# Patient Record
Sex: Female | Born: 1950 | Race: Black or African American | Hispanic: No | Smoking: Former smoker
Health system: Southern US, Community
[De-identification: ages and names within clinical notes are randomized; demographics above are authoritative.]

## PROBLEM LIST (undated history)

## (undated) DIAGNOSIS — E785 Hyperlipidemia, unspecified: Secondary | ICD-10-CM

## (undated) DIAGNOSIS — F419 Anxiety disorder, unspecified: Secondary | ICD-10-CM

## (undated) DIAGNOSIS — K8020 Calculus of gallbladder without cholecystitis without obstruction: Secondary | ICD-10-CM

## (undated) DIAGNOSIS — K635 Polyp of colon: Secondary | ICD-10-CM

## (undated) DIAGNOSIS — D509 Iron deficiency anemia, unspecified: Secondary | ICD-10-CM

## (undated) DIAGNOSIS — D571 Sickle-cell disease without crisis: Secondary | ICD-10-CM

## (undated) DIAGNOSIS — K219 Gastro-esophageal reflux disease without esophagitis: Secondary | ICD-10-CM

## (undated) DIAGNOSIS — G459 Transient cerebral ischemic attack, unspecified: Secondary | ICD-10-CM

## (undated) DIAGNOSIS — E039 Hypothyroidism, unspecified: Secondary | ICD-10-CM

## (undated) DIAGNOSIS — Z972 Presence of dental prosthetic device (complete) (partial): Secondary | ICD-10-CM

## (undated) DIAGNOSIS — E079 Disorder of thyroid, unspecified: Secondary | ICD-10-CM

## (undated) DIAGNOSIS — R1310 Dysphagia, unspecified: Secondary | ICD-10-CM

## (undated) DIAGNOSIS — R519 Headache, unspecified: Secondary | ICD-10-CM

## (undated) DIAGNOSIS — Z87891 Personal history of nicotine dependence: Secondary | ICD-10-CM

## (undated) DIAGNOSIS — Z8774 Personal history of (corrected) congenital malformations of heart and circulatory system: Secondary | ICD-10-CM

## (undated) DIAGNOSIS — R0600 Dyspnea, unspecified: Secondary | ICD-10-CM

## (undated) DIAGNOSIS — Z973 Presence of spectacles and contact lenses: Secondary | ICD-10-CM

## (undated) DIAGNOSIS — E119 Type 2 diabetes mellitus without complications: Secondary | ICD-10-CM

## (undated) DIAGNOSIS — J40 Bronchitis, not specified as acute or chronic: Secondary | ICD-10-CM

## (undated) DIAGNOSIS — Z87898 Personal history of other specified conditions: Secondary | ICD-10-CM

## (undated) DIAGNOSIS — Z8719 Personal history of other diseases of the digestive system: Secondary | ICD-10-CM

## (undated) DIAGNOSIS — J45909 Unspecified asthma, uncomplicated: Secondary | ICD-10-CM

## (undated) DIAGNOSIS — R51 Headache: Secondary | ICD-10-CM

## (undated) DIAGNOSIS — I10 Essential (primary) hypertension: Secondary | ICD-10-CM

## (undated) DIAGNOSIS — M47812 Spondylosis without myelopathy or radiculopathy, cervical region: Secondary | ICD-10-CM

## (undated) DIAGNOSIS — D649 Anemia, unspecified: Secondary | ICD-10-CM

## (undated) HISTORY — PX: ROTATOR CUFF REPAIR: SHX139

## (undated) HISTORY — DX: Dysphagia, unspecified: R13.10

## (undated) HISTORY — DX: Anemia, unspecified: D64.9

## (undated) HISTORY — DX: Gastro-esophageal reflux disease without esophagitis: K21.9

## (undated) HISTORY — DX: Essential (primary) hypertension: I10

## (undated) HISTORY — DX: Iron deficiency anemia, unspecified: D50.9

## (undated) HISTORY — PX: TONSILLECTOMY: SUR1361

## (undated) HISTORY — DX: Disorder of thyroid, unspecified: E07.9

## (undated) HISTORY — DX: Personal history of (corrected) congenital malformations of heart and circulatory system: Z87.74

## (undated) HISTORY — DX: Personal history of other specified conditions: Z87.898

## (undated) HISTORY — PX: CHOLECYSTECTOMY: SHX55

## (undated) HISTORY — DX: Type 2 diabetes mellitus without complications: E11.9

## (undated) HISTORY — DX: Hyperlipidemia, unspecified: E78.5

## (undated) HISTORY — PX: NECK SURGERY: SHX720

## (undated) HISTORY — DX: Polyp of colon: K63.5

## (undated) HISTORY — DX: Personal history of nicotine dependence: Z87.891

## (undated) HISTORY — DX: Calculus of gallbladder without cholecystitis without obstruction: K80.20

## (undated) HISTORY — DX: Anxiety disorder, unspecified: F41.9

## (undated) HISTORY — DX: Hypothyroidism, unspecified: E03.9

## (undated) HISTORY — DX: Spondylosis without myelopathy or radiculopathy, cervical region: M47.812

## (undated) HISTORY — PX: ABDOMINAL HYSTERECTOMY: SHX81

## (undated) HISTORY — DX: Unspecified asthma, uncomplicated: J45.909

---

## 1990-07-05 DIAGNOSIS — J4489 Other specified chronic obstructive pulmonary disease: Secondary | ICD-10-CM | POA: Insufficient documentation

## 1990-07-05 DIAGNOSIS — J449 Chronic obstructive pulmonary disease, unspecified: Secondary | ICD-10-CM | POA: Insufficient documentation

## 1998-01-09 ENCOUNTER — Inpatient Hospital Stay (HOSPITAL_COMMUNITY): Admission: AD | Admit: 1998-01-09 | Discharge: 1998-01-15 | Payer: Self-pay | Admitting: Internal Medicine

## 1998-02-09 ENCOUNTER — Ambulatory Visit (HOSPITAL_COMMUNITY): Admission: RE | Admit: 1998-02-09 | Discharge: 1998-02-09 | Payer: Self-pay | Admitting: Internal Medicine

## 1999-08-13 ENCOUNTER — Encounter: Payer: Self-pay | Admitting: Internal Medicine

## 1999-08-13 ENCOUNTER — Ambulatory Visit (HOSPITAL_COMMUNITY): Admission: RE | Admit: 1999-08-13 | Discharge: 1999-08-13 | Payer: Self-pay | Admitting: *Deleted

## 2000-10-06 ENCOUNTER — Encounter: Payer: Self-pay | Admitting: Gastroenterology

## 2000-10-06 ENCOUNTER — Ambulatory Visit (HOSPITAL_COMMUNITY): Admission: RE | Admit: 2000-10-06 | Discharge: 2000-10-06 | Payer: Self-pay | Admitting: Gastroenterology

## 2001-03-09 ENCOUNTER — Ambulatory Visit (HOSPITAL_COMMUNITY): Admission: RE | Admit: 2001-03-09 | Discharge: 2001-03-09 | Payer: Self-pay | Admitting: Infectious Diseases

## 2001-03-09 ENCOUNTER — Encounter: Payer: Self-pay | Admitting: Infectious Diseases

## 2001-07-26 ENCOUNTER — Encounter: Payer: Self-pay | Admitting: Internal Medicine

## 2001-07-26 ENCOUNTER — Ambulatory Visit (HOSPITAL_COMMUNITY): Admission: RE | Admit: 2001-07-26 | Discharge: 2001-07-26 | Payer: Self-pay | Admitting: Internal Medicine

## 2002-12-12 ENCOUNTER — Encounter: Admission: RE | Admit: 2002-12-12 | Discharge: 2002-12-12 | Payer: Self-pay | Admitting: Internal Medicine

## 2002-12-12 ENCOUNTER — Encounter: Payer: Self-pay | Admitting: Internal Medicine

## 2003-03-21 ENCOUNTER — Encounter: Payer: Self-pay | Admitting: Emergency Medicine

## 2003-03-21 ENCOUNTER — Emergency Department (HOSPITAL_COMMUNITY): Admission: EM | Admit: 2003-03-21 | Discharge: 2003-03-21 | Payer: Self-pay | Admitting: Emergency Medicine

## 2003-05-12 ENCOUNTER — Ambulatory Visit (HOSPITAL_COMMUNITY): Admission: RE | Admit: 2003-05-12 | Discharge: 2003-05-12 | Payer: Self-pay | Admitting: Gastroenterology

## 2003-05-22 ENCOUNTER — Encounter: Payer: Self-pay | Admitting: Gastroenterology

## 2003-05-22 ENCOUNTER — Ambulatory Visit (HOSPITAL_COMMUNITY): Admission: RE | Admit: 2003-05-22 | Discharge: 2003-05-22 | Payer: Self-pay | Admitting: Gastroenterology

## 2003-05-22 ENCOUNTER — Encounter (INDEPENDENT_AMBULATORY_CARE_PROVIDER_SITE_OTHER): Payer: Self-pay | Admitting: Specialist

## 2004-08-23 ENCOUNTER — Encounter: Admission: RE | Admit: 2004-08-23 | Discharge: 2004-08-23 | Payer: Self-pay | Admitting: Internal Medicine

## 2004-08-28 ENCOUNTER — Ambulatory Visit (HOSPITAL_COMMUNITY): Admission: RE | Admit: 2004-08-28 | Discharge: 2004-08-28 | Payer: Self-pay | Admitting: Internal Medicine

## 2005-06-18 ENCOUNTER — Encounter: Admission: RE | Admit: 2005-06-18 | Discharge: 2005-06-18 | Payer: Self-pay | Admitting: Internal Medicine

## 2006-02-09 ENCOUNTER — Ambulatory Visit (HOSPITAL_COMMUNITY): Admission: RE | Admit: 2006-02-09 | Discharge: 2006-02-09 | Payer: Self-pay | Admitting: Internal Medicine

## 2006-02-09 ENCOUNTER — Encounter: Payer: Self-pay | Admitting: Vascular Surgery

## 2007-08-17 ENCOUNTER — Other Ambulatory Visit: Admission: RE | Admit: 2007-08-17 | Discharge: 2007-08-17 | Payer: Self-pay | Admitting: Internal Medicine

## 2007-12-07 ENCOUNTER — Ambulatory Visit (HOSPITAL_COMMUNITY): Admission: RE | Admit: 2007-12-07 | Discharge: 2007-12-07 | Payer: Self-pay | Admitting: Internal Medicine

## 2008-03-02 ENCOUNTER — Encounter: Admission: RE | Admit: 2008-03-02 | Discharge: 2008-03-02 | Payer: Self-pay | Admitting: Neurosurgery

## 2008-03-28 ENCOUNTER — Ambulatory Visit: Payer: Self-pay | Admitting: Vascular Surgery

## 2008-09-12 ENCOUNTER — Ambulatory Visit: Payer: Self-pay | Admitting: Vascular Surgery

## 2009-03-12 ENCOUNTER — Encounter: Admission: RE | Admit: 2009-03-12 | Discharge: 2009-03-12 | Payer: Self-pay | Admitting: Internal Medicine

## 2009-03-26 ENCOUNTER — Encounter (INDEPENDENT_AMBULATORY_CARE_PROVIDER_SITE_OTHER): Payer: Self-pay | Admitting: *Deleted

## 2009-03-26 ENCOUNTER — Inpatient Hospital Stay (HOSPITAL_COMMUNITY): Admission: AD | Admit: 2009-03-26 | Discharge: 2009-03-28 | Payer: Self-pay | Admitting: Internal Medicine

## 2009-03-27 ENCOUNTER — Encounter (INDEPENDENT_AMBULATORY_CARE_PROVIDER_SITE_OTHER): Payer: Self-pay | Admitting: *Deleted

## 2009-03-27 ENCOUNTER — Ambulatory Visit: Payer: Self-pay | Admitting: Gastroenterology

## 2009-03-28 ENCOUNTER — Encounter: Payer: Self-pay | Admitting: Gastroenterology

## 2009-03-28 ENCOUNTER — Encounter (INDEPENDENT_AMBULATORY_CARE_PROVIDER_SITE_OTHER): Payer: Self-pay | Admitting: *Deleted

## 2009-03-29 ENCOUNTER — Encounter: Payer: Self-pay | Admitting: Gastroenterology

## 2009-04-06 ENCOUNTER — Ambulatory Visit: Payer: Self-pay | Admitting: Vascular Surgery

## 2009-04-06 ENCOUNTER — Encounter (INDEPENDENT_AMBULATORY_CARE_PROVIDER_SITE_OTHER): Payer: Self-pay | Admitting: *Deleted

## 2009-04-17 ENCOUNTER — Ambulatory Visit: Payer: Self-pay | Admitting: Gastroenterology

## 2009-04-17 DIAGNOSIS — E119 Type 2 diabetes mellitus without complications: Secondary | ICD-10-CM | POA: Insufficient documentation

## 2009-05-02 ENCOUNTER — Ambulatory Visit: Payer: Self-pay | Admitting: Gastroenterology

## 2009-06-28 ENCOUNTER — Inpatient Hospital Stay (HOSPITAL_COMMUNITY): Admission: AD | Admit: 2009-06-28 | Discharge: 2009-06-30 | Payer: Self-pay | Admitting: Internal Medicine

## 2009-06-28 ENCOUNTER — Encounter (INDEPENDENT_AMBULATORY_CARE_PROVIDER_SITE_OTHER): Payer: Self-pay | Admitting: *Deleted

## 2009-06-29 ENCOUNTER — Ambulatory Visit: Payer: Self-pay | Admitting: Gastroenterology

## 2009-06-29 ENCOUNTER — Encounter (INDEPENDENT_AMBULATORY_CARE_PROVIDER_SITE_OTHER): Payer: Self-pay | Admitting: *Deleted

## 2009-06-30 ENCOUNTER — Encounter (INDEPENDENT_AMBULATORY_CARE_PROVIDER_SITE_OTHER): Payer: Self-pay | Admitting: *Deleted

## 2009-07-04 ENCOUNTER — Ambulatory Visit: Payer: Self-pay | Admitting: Gastroenterology

## 2009-07-04 DIAGNOSIS — K5521 Angiodysplasia of colon with hemorrhage: Secondary | ICD-10-CM | POA: Insufficient documentation

## 2009-07-06 ENCOUNTER — Ambulatory Visit (HOSPITAL_COMMUNITY): Admission: RE | Admit: 2009-07-06 | Discharge: 2009-07-06 | Payer: Self-pay | Admitting: Gastroenterology

## 2009-07-06 ENCOUNTER — Encounter: Payer: Self-pay | Admitting: Gastroenterology

## 2009-07-09 ENCOUNTER — Encounter: Payer: Self-pay | Admitting: Gastroenterology

## 2009-07-26 ENCOUNTER — Ambulatory Visit: Payer: Self-pay | Admitting: Gastroenterology

## 2009-07-26 LAB — CONVERTED CEMR LAB
Basophils Relative: 0.9 % (ref 0.0–3.0)
Eosinophils Absolute: 0.2 10*3/uL (ref 0.0–0.7)
Eosinophils Relative: 3 % (ref 0.0–5.0)
HCT: 29.5 % — ABNORMAL LOW (ref 36.0–46.0)
Hemoglobin: 9.6 g/dL — ABNORMAL LOW (ref 12.0–15.0)
Lymphs Abs: 1.3 10*3/uL (ref 0.7–4.0)
MCHC: 32.7 g/dL (ref 30.0–36.0)
Monocytes Absolute: 0.5 10*3/uL (ref 0.1–1.0)
RBC: 3.34 M/uL — ABNORMAL LOW (ref 3.87–5.11)
WBC: 6.5 10*3/uL (ref 4.5–10.5)

## 2009-08-02 ENCOUNTER — Ambulatory Visit: Payer: Self-pay | Admitting: Gastroenterology

## 2009-08-02 LAB — CONVERTED CEMR LAB
Basophils Absolute: 0 10*3/uL (ref 0.0–0.1)
Eosinophils Absolute: 0.2 10*3/uL (ref 0.0–0.7)
Hemoglobin: 9.1 g/dL — ABNORMAL LOW (ref 12.0–15.0)
Lymphocytes Relative: 18.6 % (ref 12.0–46.0)
Lymphs Abs: 1.2 10*3/uL (ref 0.7–4.0)
MCHC: 32.7 g/dL (ref 30.0–36.0)
Monocytes Absolute: 0.5 10*3/uL (ref 0.1–1.0)
Neutro Abs: 4.4 10*3/uL (ref 1.4–7.7)
RDW: 17.6 % — ABNORMAL HIGH (ref 11.5–14.6)

## 2009-08-03 ENCOUNTER — Telehealth: Payer: Self-pay | Admitting: Gastroenterology

## 2009-08-06 ENCOUNTER — Telehealth: Payer: Self-pay | Admitting: Gastroenterology

## 2009-08-16 ENCOUNTER — Ambulatory Visit: Payer: Self-pay | Admitting: Gastroenterology

## 2009-08-20 ENCOUNTER — Telehealth: Payer: Self-pay | Admitting: Gastroenterology

## 2009-08-20 DIAGNOSIS — D649 Anemia, unspecified: Secondary | ICD-10-CM | POA: Insufficient documentation

## 2009-08-20 LAB — CONVERTED CEMR LAB
Basophils Absolute: 0 10*3/uL (ref 0.0–0.1)
Eosinophils Absolute: 0.2 10*3/uL (ref 0.0–0.7)
HCT: 24.3 % — ABNORMAL LOW (ref 36.0–46.0)
Lymphs Abs: 1.3 10*3/uL (ref 0.7–4.0)
Monocytes Absolute: 0.5 10*3/uL (ref 0.1–1.0)
Neutro Abs: 4.4 10*3/uL (ref 1.4–7.7)
RDW: 18.2 % — ABNORMAL HIGH (ref 11.5–14.6)

## 2009-08-27 ENCOUNTER — Encounter (HOSPITAL_COMMUNITY): Admission: RE | Admit: 2009-08-27 | Discharge: 2009-11-25 | Payer: Self-pay | Admitting: Gastroenterology

## 2009-09-11 ENCOUNTER — Ambulatory Visit: Payer: Self-pay | Admitting: Gastroenterology

## 2009-09-11 LAB — CONVERTED CEMR LAB
Eosinophils Absolute: 0.3 10*3/uL (ref 0.0–0.7)
HCT: 26.5 % — ABNORMAL LOW (ref 36.0–46.0)
Lymphs Abs: 1 10*3/uL (ref 0.7–4.0)
MCHC: 33.9 g/dL (ref 30.0–36.0)
MCV: 89.6 fL (ref 78.0–100.0)
Monocytes Absolute: 0.5 10*3/uL (ref 0.1–1.0)
Neutrophils Relative %: 61.6 % (ref 43.0–77.0)
Platelets: 233 10*3/uL (ref 150.0–400.0)

## 2009-09-13 ENCOUNTER — Encounter (INDEPENDENT_AMBULATORY_CARE_PROVIDER_SITE_OTHER): Payer: Self-pay | Admitting: *Deleted

## 2009-09-13 ENCOUNTER — Observation Stay (HOSPITAL_COMMUNITY): Admission: AD | Admit: 2009-09-13 | Discharge: 2009-09-17 | Payer: Self-pay | Admitting: Internal Medicine

## 2009-09-14 ENCOUNTER — Encounter (INDEPENDENT_AMBULATORY_CARE_PROVIDER_SITE_OTHER): Payer: Self-pay | Admitting: *Deleted

## 2009-09-15 ENCOUNTER — Encounter (INDEPENDENT_AMBULATORY_CARE_PROVIDER_SITE_OTHER): Payer: Self-pay | Admitting: *Deleted

## 2009-09-16 ENCOUNTER — Encounter (INDEPENDENT_AMBULATORY_CARE_PROVIDER_SITE_OTHER): Payer: Self-pay | Admitting: *Deleted

## 2009-09-24 ENCOUNTER — Telehealth: Payer: Self-pay | Admitting: Gastroenterology

## 2009-10-03 ENCOUNTER — Ambulatory Visit: Payer: Self-pay | Admitting: Gastroenterology

## 2009-10-03 LAB — CONVERTED CEMR LAB
Basophils Absolute: 0.1 10*3/uL (ref 0.0–0.1)
Eosinophils Absolute: 0.4 10*3/uL (ref 0.0–0.7)
Lymphocytes Relative: 20.2 % (ref 12.0–46.0)
MCHC: 34.4 g/dL (ref 30.0–36.0)
MCV: 90.8 fL (ref 78.0–100.0)
Monocytes Absolute: 0.5 10*3/uL (ref 0.1–1.0)
Neutrophils Relative %: 63.4 % (ref 43.0–77.0)
Platelets: 224 10*3/uL (ref 150.0–400.0)
RBC: 3.14 M/uL — ABNORMAL LOW (ref 3.87–5.11)
RDW: 15.8 % — ABNORMAL HIGH (ref 11.5–14.6)

## 2009-10-05 ENCOUNTER — Ambulatory Visit: Payer: Self-pay | Admitting: Gastroenterology

## 2009-10-05 DIAGNOSIS — R1310 Dysphagia, unspecified: Secondary | ICD-10-CM | POA: Insufficient documentation

## 2009-10-09 ENCOUNTER — Telehealth: Payer: Self-pay | Admitting: Gastroenterology

## 2009-10-09 ENCOUNTER — Ambulatory Visit (HOSPITAL_COMMUNITY): Admission: RE | Admit: 2009-10-09 | Discharge: 2009-10-09 | Payer: Self-pay | Admitting: Gastroenterology

## 2009-10-10 ENCOUNTER — Telehealth: Payer: Self-pay | Admitting: Gastroenterology

## 2009-10-10 ENCOUNTER — Encounter (INDEPENDENT_AMBULATORY_CARE_PROVIDER_SITE_OTHER): Payer: Self-pay | Admitting: *Deleted

## 2009-10-25 ENCOUNTER — Ambulatory Visit: Payer: Self-pay | Admitting: Vascular Surgery

## 2009-11-08 ENCOUNTER — Telehealth: Payer: Self-pay | Admitting: Gastroenterology

## 2009-11-09 ENCOUNTER — Ambulatory Visit: Payer: Self-pay | Admitting: Gastroenterology

## 2009-11-09 ENCOUNTER — Ambulatory Visit (HOSPITAL_COMMUNITY): Admission: RE | Admit: 2009-11-09 | Discharge: 2009-11-09 | Payer: Self-pay | Admitting: Gastroenterology

## 2010-01-03 ENCOUNTER — Ambulatory Visit: Payer: Self-pay | Admitting: Gastroenterology

## 2010-01-14 ENCOUNTER — Telehealth: Payer: Self-pay | Admitting: Gastroenterology

## 2010-05-31 ENCOUNTER — Ambulatory Visit: Payer: Self-pay | Admitting: Vascular Surgery

## 2010-07-04 NOTE — Progress Notes (Signed)
Summary: Pt. rescheduled procedure  Phone Note Call from Patient Call back at Home Phone [phone]   Caller: Patient Call For: Dr. Arlyce Dice Reason for Call: Talk to Nurse Summary of Call: would like to resch hospital procedure Initial call taken by: Vallarie Mare,  Oct 10, 2009 10:32 AM  Follow-up for Phone Call        Pt. rescheduled her procedure to 11-09-09 at 1pm. All instructions reviewed w/pt's daughter by phone and mailed to pt. Pt. instructed to call back as needed.  Follow-up by: Laureen Ochs LPN,  Oct 10, 2009 11:02 AM

## 2010-07-04 NOTE — Letter (Signed)
Summary: Diabetic Instructions  [facility]  [address] [place], [place] [address]   Phone: [phone]  Fax: [phone]    Lacey Vega 1951/05/18 MRN: [id number]   X    ORAL DIABETIC MEDICATION INSTRUCTIONS METFORMIN  The day before your procedure:   Take your diabetic pill as you do normally  The day of your procedure:   Do not take your diabetic pill    We will check your blood sugar levels during the admission process and again in Recovery before discharging you home  ________________________________________________________________________  _  _   INSULIN (LONG ACTING) MEDICATION INSTRUCTIONS (Lantus, NPH, 70/30, Humulin, Novolin-N)   The day before your procedure:   Take  your regular evening dose    The day of your procedure:   Do not take your morning dose    _  _   INSULIN (SHORT ACTING) MEDICATION INSTRUCTIONS (Regular, Humulog, Novolog)   The day before your procedure:   Do not take your evening dose   The day of your procedure:   Do not take your morning dose   _  _   INSULIN PUMP MEDICATION INSTRUCTIONS  We will contact the physician managing your diabetic care for written dosage instructions for the day before your procedure and the day of your procedure.  Once we have received the instructions, we will contact you.

## 2010-07-04 NOTE — Progress Notes (Signed)
Summary: Pt. Cancelled Manometry  Phone Note From Other Clinic Call back at [facility] Phone [phone]   Caller: Camelia Eng at [facility].  Call For: patient Summary of Call: Pt. called and cancelled her Manometry scheduled for 01-15-10,states she doesn't want to have it done. "I don't think it will help."   I encouraged pt. to have manometry done, she states she will callback, when she is ready to reschedule it. Pt. instructed to call back as needed.  Initial call taken by: Laureen Ochs LPN,  January 14, 2010 1:46 PM  Follow-up for Phone Call        ok Follow-up by: Louis Meckel MD,  January 15, 2010 10:03 AM

## 2010-07-04 NOTE — Progress Notes (Signed)
Summary: Blood Transfusion and Repeat Labs scheduled  Phone Note Outgoing Call   Call placed by: Laureen Ochs LPN,  August 20, 2009 12:32 PM Call placed to: Patient Summary of Call: Per Dr.Kaplan, pt. needs a transfusion of 2 units PRBC and repeat labs 2 weeks after transfusion.  Transfusion is scheduled for 08-28-09 at 10:15am at [facility], pt. will get labs for [facility] on 08-27-09. She will have a repeat CBC at [facility] on 09-11-09. Pt. instructed to call back as needed.  Initial call taken by: Laureen Ochs LPN,  August 20, 2009 12:44 PM  New Problems: ANEMIA (ICD-285.9)   New Problems: ANEMIA (ICD-285.9)

## 2010-07-04 NOTE — Procedures (Signed)
Summary: Manometry Order/[place]  Manometry Order/[place]   Imported By: Sherian Rein 01/08/2010 09:02:28  _____________________________________________________________________  External Attachment:    Type:   Image     Comment:   External Document

## 2010-07-04 NOTE — Procedures (Signed)
Summary: Upper Endoscopy w/DIL  Patient: Lacey Vega Note: All result statuses are Final unless otherwise noted.  Tests: (1) Upper Endoscopy w/DIL (UED)  UED Upper Endoscopy w/DIL                             DONE     [facility]     [address] [place], [place]  [address]           ENDOSCOPY PROCEDURE REPORT           PATIENT:  Lacey Vega, Lacey Vega  MR#:  [id number]     BIRTHDATE:  [birth year], 58 yrs. old  GENDER:  female           ENDOSCOPIST:  Barbette Hair. Arlyce Dice, MD     ASSISTANT:           PROCEDURE DATE:  11/09/2009     PROCEDURE:  EGD with balloon dilatation     ASA CLASS:  Class II     INDICATIONS:  1) dysphagia           MEDICATIONS:   Fentanyl 80 mcg, Versed 8 mg IV, glycopyrrolate     (Robinal) 0.2 mg     TOPICAL ANESTHETIC:  Cetacaine Spray           DESCRIPTION OF PROCEDURE:   After the risks benefits and     alternatives of the procedure were thoroughly explained, informed     consent was obtained.  The  endoscope was introduced through the     mouth and advanced to the third portion of the duodenum, without     limitations.  The instrument was slowly withdrawn as the mucosa     was carefully examined.           A stricture was found at the gastroesophageal junction. Early     stricture  The examination was otherwise normal.    Dilation was     then performed at the gastroesphageal junction           1) Dilator:  Balloon  Size(s):  18-19     Resistance:  minimal  Heme:  none     Appearance:           COMPLICATIONS:  None           ENDOSCOPIC IMPRESSION:     1) Stricture at the gastroesophageal junction - s/p balloon     dilitation     2) Otherwise normal examination.     RECOMMENDATIONS:     1) continue PPI     2) call office to schedule an office visit for _76month           REPEAT EXAM:  No           ______________________________     Barbette Hair. Arlyce Dice, MD           CC:  Willey Blade, M.D.           n.     eSIGNED:   Barbette Hair.  Kolbee Bogusz at 11/09/2009 01:34 PM           Lacey Vega, [id number]  Note: An exclamation mark (!) indicates a result that was not dispersed into the flowsheet. Document Creation Date: 11/09/2009 1:35 PM _______________________________________________________________________  (1) Order result status: Final Collection or observation date-time: 11/09/2009 13:31 Requested date-time:  Receipt date-time:  Reported date-time:  Referring Physician:   Ordering  Physician: Melvia Heaps [phone]) Specimen Source:  Source: Launa Grill Order Number: [id number] Lab site:

## 2010-07-04 NOTE — Procedures (Signed)
Summary: Prep/[facility]  Prep/[facility]   Imported By: Lester Holcomb 07/06/2009 09:42:46  _____________________________________________________________________  External Attachment:    Type:   Image     Comment:   External Document

## 2010-07-04 NOTE — Procedures (Signed)
Summary: Small Bowel Endoscopy  Patient: Lacey Vega Note: All result statuses are Final unless otherwise noted.  Tests: (1) Small Bowel Endoscopy (SBE)  SBE Small Bowel Endoscopy                             DONE     [facility]     [address] [place], [place]  [address]           OPERATIVE PROCEDURE REPORT           PATIENT:  Lacey Vega, Lacey Vega  MR#:  [id number]     BIRTHDATE:  1951-01-09, 58 yrs. old  GENDER:  female           ENDOSCOPIST:  Barbette Hair. Arlyce Dice, MD     ASSISTANT:           PROCEDURE DATE:  07/06/2009     PROCEDURE:  Small Bowel Endoscopy, Argon Plasma Coagulation     ASA CLASS:  Class II     INDICATIONS:  1) a-v malformation           MEDICATIONS:   Fentanyl 100 mcg IV, Versed 10 mg IV, Benadryl 12.5     mg IV, glycopyrrolate (Robinal) 0.2 mg IV     TOPICAL ANESTHETIC:  Cetacaine Spray           DESCRIPTION OF PROCEDURE:   After the risks benefits and     alternatives of the procedure were thoroughly explained, informed     consent was obtained.  The  endoscope was introduced through the     mouth and advanced to the proximal jejunum utilizing the pediatric     colonoscope, to 160cm, without limitations.  The instrument was     slowly withdrawn as the mucosa was fully examined.     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    The scope was then withdrawn from     the patient and the procedure terminated.           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 Arlyce Dice, MD          CC:  Willey Blade, M.D.           n.     eSIGNED:   Barbette Hair. Delphin Funes at 07/06/2009 03:54 PM           Lacey Vega, [id number]  Note: An exclamation mark (!) indicates a result that was not dispersed into the flowsheet. Document Creation Date: 07/06/2009 3:54 PM _______________________________________________________________________  (1) Order result status: Final Collection or observation date-time: 07/06/2009 15:47 Requested date-time:  Receipt date-time:  Reported date-time:  Referring Physician:   Ordering Physician: Melvia Heaps [phone]) Specimen Source:  Source: Launa Grill Order Number: [id number] Lab site:   Appended Document: Small Bowel Endoscopy Pt. for labs 07-30-09 and appt. with Dr.Jdyn Parkerson on 08-02-09 at 8:30am. Letter mailed to pt. Pt. instructed to call back as needed.

## 2010-07-04 NOTE — Assessment & Plan Note (Signed)
Summary: POST HOSP/ANEMIA    CHECK HGB/MAY NEED ENTEROSCOPY-PER AMY.  ...   History of Present Illness Visit Type: Follow-up Visit Primary GI MD: Melvia Heaps MD [facility] Primary Provider: Willey Blade, MD Chief Complaint: Patient here for f/u after hospitalization for anemia. Patient states that she is doing well and denies any GI symptoms at this time. She denies any SOB, increased fatigue, abdominal pain, change in bowels or upper GI symptoms. History of Present Illness:   [name] has returned for her iron deficiency anemia.  She was hospitalized several days ago with a symptomatic anemia.  Hemoglobin was 6.5.  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  She has had no overt bleeding.  Colonoscopy and upper endoscopy in October, 2000 and negative for a GI bleeding source.  The patient denies melena or hematochezia.   GI Review of Systems      Denies abdominal pain, acid reflux, belching, bloating, chest pain, dysphagia with liquids, dysphagia with solids, heartburn, loss of appetite, nausea, vomiting, vomiting blood, weight loss, and  weight gain.        Denies anal fissure, black tarry stools, change in bowel habit, constipation, diarrhea, diverticulosis, fecal incontinence, heme positive stool, hemorrhoids, irritable bowel syndrome, jaundice, light color stool, liver problems, rectal bleeding, and  rectal pain. Preventive Screening-Counseling & Management  Caffeine-Diet-Exercise     Does Patient Exercise: no    Current Medications (verified): 1)  Singulair 10 Mg Tabs (Montelukast Sodium) .Marland Kitchen.. 1 Tablet By Mouth Once Daily 2)  Centrum Silver  Tabs (Multiple Vitamins-Minerals) .Marland Kitchen.. 1 Tablet By Mouth Once Daily 3)  Oscal 500/200 D-3 500-200 Mg-Unit Tabs (Calcium-Vitamin D) .Marland Kitchen.. 1 Tablet By Mouth Once Daily 4)  Premarin 0.3 Mg Tabs (Estrogens Conjugated) .Marland Kitchen.. 1 Tablet By Mouth Once Daily 5)  Omeprazole 40 Mg Cpdr (Omeprazole) .... Take 1 Tablet By Mouth Once A Day 6)   Cinnamon 500 Mg Tabs (Cinnamon) .... Take 1 Tablet By Mouth Once A Day 7)  Fish Oil 1200 Mg Caps (Omega-3 Fatty Acids) .... Take 1 Capsule By Mouth Once A Day 8)  Metformin Hcl 500 Mg Tabs (Metformin Hcl) .... Take 1 Tablet By Mouth Once A Day 9)  Soma 350 Mg Tabs (Carisoprodol) .... Take 1 Tablet 3 Times A Day For Muscle Spasm 10)  Neurontin 100 Mg Caps (Gabapentin) .... Take 1 Tablet By Mouth Two Times A Day 11)  Magnesium Oxide 400 Mg Tabs (Magnesium Oxide) .... Take 1 Tablet By Mouth Two Times A Day 12)  Levothroid 150 Mcg Tabs (Levothyroxine Sodium) .... Take 1 Tablet By Mouth Once A Day 13)  Nu-Iron 150 Mg Caps (Polysaccharide Iron Complex) .... Take 1 Tablet By Mouth Two Times A Day 14)  Vitamin B-12 1000 Mcg Tabs (Cyanocobalamin) .... Take 1 Tablet By Mouth Once A Day  Allergies (verified): No Known Drug Allergies  Past History:  Past Medical History: Reviewed history from 04/17/2009 and no changes required. Esophageal Stricture Hyperplastic Colon Polyps GERD Anemia Anxiety Disorder Arthritis Asthma Diabetes Gallstones Hyperlipidemia Hypothyroidism  Past Surgical History: Reviewed history from 04/11/2009 and no changes required. Cholecystectomy Hysterectomy Neck Surgery  Family History: Reviewed history from 04/17/2009 and no changes required. No FH of Colon Cancer: Family History of Diabetes: Mother Family History of Heart Disease: Brother deceased MI, ? 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 Quit in 2002 Daily Caffeine Use-1-2 cups daily Illicit Drug Use - no Alcohol Use -  no Patient does not get regular exercise.  Does Patient Exercise:  no  Review of Systems       The patient complains of allergy/sinus, anemia, and muscle pains/cramps.    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  Recommendations #1 enteroscopy with laser obliteration of any AVMs #2 continue iron supplementation indefinitely  Orders: Enteroscopy  (Enteroscopy)  Problem # 2:  DM (ICD-250.00) Assessment: Comment Only  Patient Instructions: 1)  CC Dr. Willey Blade 2)  Your Small Bowel Enteroscopy/ERBY is scheduled for 07/06/2009 at 2:15 at [facility] End[facility] department 3)  You have been instructed on your Diabetic meds 4)  The medication list was reviewed and reconciled.  All changed / newly prescribed medications were explained.  A complete medication list was provided to the patient / caregiver.

## 2010-07-04 NOTE — Assessment & Plan Note (Signed)
Summary: POST HOSPITAL AND LABS FROM 10-03-09  FOLLOW-UP.         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  Her last hemoglobin was improved at 9.8.    She complains of dysphagia to liquids and solids.  On occasion she has great difficulty due to food or sometimes liquid getting stuck immediately after swallowing.  She eventually pass it or throat up. When this occurs she may have chest pain that radiates to the back. Prior endoscopy did not demonstrate an  esophageal stricture.   GI Review of Systems    Reports dysphagia with liquids and  dysphagia with solids.      Denies abdominal pain, acid reflux, belching, bloating, chest pain, heartburn, loss of appetite, nausea, vomiting, vomiting blood, weight loss, and  weight gain.        Denies anal fissure, black tarry stools, change in bowel habit, constipation, diarrhea, diverticulosis, fecal incontinence, heme positive stool, hemorrhoids, irritable bowel syndrome, jaundice, light color stool, liver problems, rectal bleeding, and  rectal pain.    Current Medications (verified): 1)  Singulair 10 Mg Tabs (Montelukast Sodium) .Marland Kitchen.. 1 Tablet By Mouth Once Daily 2)  Centrum Silver  Tabs (Multiple Vitamins-Minerals) .Marland Kitchen.. 1 Tablet By Mouth Once Daily 3)  Oscal 500/200 D-3 500-200 Mg-Unit Tabs (Calcium-Vitamin D) .Marland Kitchen.. 1 Tablet By Mouth Once Daily 4)  Premarin 0.3 Mg Tabs (Estrogens Conjugated) .Marland Kitchen.. 1 Tablet By Mouth Once Daily 5)  Dexilant 60 Mg Cpdr (Dexlansoprazole) .... One Tablet By Mouth Once Daily 6)  Cinnamon 500 Mg Tabs (Cinnamon) .... Take 1 Tablet By Mouth Once A Day 7)  Fish  Oil 1200 Mg Caps (Omega-3 Fatty Acids) .... Take 1 Capsule By Mouth Once A Day 8)  Metformin Hcl 500 Mg Tabs (Metformin Hcl) .... Take 1 Tablet By Mouth Once A Day 9)  Soma 350 Mg Tabs (Carisoprodol) .... Take 1 Tablet 3 Times A Day For Muscle Spasm 10)  Neurontin 100 Mg Caps (Gabapentin) .... Take 1 Tablet By Mouth Two Times A Day 11)  Magnesium Oxide 400 Mg Tabs (Magnesium Oxide) .... Take 1 Tablet By Mouth Two Times A Day 12)  Levothroid 150 Mcg Tabs (Levothyroxine Sodium) .... Take 1 Tablet By Mouth Once A Day 13)  Vitamin B-12 1000 Mcg Tabs (Cyanocobalamin) .... Take 1 Tablet By Mouth Once A Day 14)  Feosol 200 (65 Fe) Mg Tabs (Ferrous Sulfate Dried) .... Take 1 P.o. B.i.d. 15)  Hydrocodone-Acetaminophen 5-325 Mg Tabs (Hydrocodone-Acetaminophen) .... As Needed For Headache 16)  Metoprolol Tartrate 25 Mg Tabs (Metoprolol Tartrate) .... Take 1/2 Tablet By Mouth Two Times A Day  Allergies (verified): No Known Drug Allergies  Past History:  Past Medical History: Reviewed history from 04/17/2009 and no changes required. Esophageal Stricture Hyperplastic Colon Polyps GERD Anemia Anxiety Disorder Arthritis Asthma Diabetes Gallstones Hyperlipidemia Hypothyroidism  Past Surgical History: Reviewed history from 04/11/2009 and no changes required. Cholecystectomy Hysterectomy Neck Surgery  Family History: Reviewed history from 04/17/2009 and no changes required. No FH of Colon Cancer: Family History of Diabetes: Mother Family History of Heart Disease: Brother deceased MI, ? Father MI  Social History:  Reviewed history from 07/04/2009 and no changes required. 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 Quit in 2002 Daily Caffeine Use-1-2 cups daily Illicit Drug Use - no Alcohol Use - no Patient does not get regular exercise.   Review of Systems       The patient complains of fatigue.  The patient denies allergy/sinus, anemia,  anxiety-new, arthritis/joint pain, back pain, blood in urine, breast changes/lumps, confusion, cough, coughing up blood, depression-new, fainting, fever, headaches-new, hearing problems, heart murmur, heart rhythm changes, itching, menstrual pain, muscle pains/cramps, night sweats, nosebleeds, pregnancy symptoms, shortness of breath, skin rash, sleeping problems, sore throat, swelling of feet/legs, swollen lymph glands, thirst - excessive, urination - excessive, urination changes/pain, urine leakage, vision changes, and voice change.    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  On supplementary iron her blood counts are stable.  Plan to continue with the same while monitoring her blood counts  Problem # 2:  DYSPHAGIA UNSPECIFIED (ICD-787.20) Symptoms suggested a motility disorder.  A fixed stricture is less likely.  Medications #1 barium esophagogram  Patient Instructions: 1)  Copy sent to : [name],MD  Appended Document: Orders Update    Clinical Lists Changes  Orders: Added new Test order of Barium Swallow (Barium Swallow) - Signed

## 2010-07-04 NOTE — Letter (Signed)
Summary: Appt Reminder 2  [facility]  [address] [place], [place] [address]   Phone: [phone]  Fax: [phone]        July 09, 2009 MRN: [id number]    [facility] [address] [place], [place]  [address]    Dear Ms. Jennette Kettle,   You have a return appointment with Dr.Robert Arlyce Dice on 08-02-09 at 8:30am. Please have labs drawn at the [facility] lab around 07-30-09. Please remember to bring a complete list of the medicines you are taking, your insurance card and your co-pay.  If you have to cancel or reschedule this appointment, please call before 5:00 pm the evening before to avoid a cancellation fee.  If you have any questions or concerns, please call [phone].    Sincerely,    Laureen Ochs LPN  Appended Document: Appt Reminder 2 Letter mailed to patient.

## 2010-07-04 NOTE — Assessment & Plan Note (Signed)
Summary: F/U FROM ENTEROSCOPY AND RECENT LABS             Lacey Vega   History of Present Illness Visit Type: Follow-up Visit Primary GI MD: Melvia Heaps MD [place] Primary Provider: Willey Blade, MD Requesting Provider: n/a Chief Complaint: F/u from procedure and labs. Pt c/o of fatigue and shortness of breath  History of Present Illness:   Lacey Vega has returned following enteroscopy with laser obliteration of several AVMs.  Hemoglobin from July 26, 2009 was 9.6.  She complains of mild weakness and minimal shortness of breath.   GI Review of Systems      Denies abdominal pain, acid reflux, belching, bloating, chest pain, dysphagia with liquids, dysphagia with solids, heartburn, loss of appetite, nausea, vomiting, vomiting blood, weight loss, and  weight gain.        Denies anal fissure, black tarry stools, change in bowel habit, constipation, diarrhea, diverticulosis, fecal incontinence, heme positive stool, hemorrhoids, irritable bowel syndrome, jaundice, light color stool, liver problems, rectal bleeding, and  rectal pain.    Current Medications (verified): 1)  Singulair 10 Mg Tabs (Montelukast Sodium) .Marland Kitchen.. 1 Tablet By Mouth Once Daily 2)  Centrum Silver  Tabs (Multiple Vitamins-Minerals) .Marland Kitchen.. 1 Tablet By Mouth Once Daily 3)  Oscal 500/200 D-3 500-200 Mg-Unit Tabs (Calcium-Vitamin D) .Marland Kitchen.. 1 Tablet By Mouth Once Daily 4)  Premarin 0.3 Mg Tabs (Estrogens Conjugated) .Marland Kitchen.. 1 Tablet By Mouth Once Daily 5)  Dexilant 60 Mg Cpdr (Dexlansoprazole) .... One Tablet By Mouth Once Daily 6)  Cinnamon 500 Mg Tabs (Cinnamon) .... Take 1 Tablet By Mouth Once A Day 7)  Fish Oil 1200 Mg Caps (Omega-3 Fatty Acids) .... Take 1 Capsule By Mouth Once A Day 8)  Metformin Hcl 500 Mg Tabs (Metformin Hcl) .... Take 1 Tablet By Mouth Once A Day 9)  Soma 350 Mg Tabs (Carisoprodol) .... Take 1 Tablet 3 Times A Day For Muscle Spasm 10)  Neurontin 100 Mg Caps (Gabapentin) .... Take 1 Tablet By Mouth Two Times A  Day 11)  Magnesium Oxide 400 Mg Tabs (Magnesium Oxide) .... Take 1 Tablet By Mouth Two Times A Day 12)  Levothroid 150 Mcg Tabs (Levothyroxine Sodium) .... Take 1 Tablet By Mouth Once A Day 13)  Nu-Iron 150 Mg Caps (Polysaccharide Iron Complex) .... Take 1 Tablet By Mouth Two Times A Day 14)  Vitamin B-12 1000 Mcg Tabs (Cyanocobalamin) .... Take 1 Tablet By Mouth Once A Day  Allergies (verified): No Known Drug Allergies  Past History:  Past Medical History: Reviewed history from 04/17/2009 and no changes required. Esophageal Stricture Hyperplastic Colon Polyps GERD Anemia Anxiety Disorder Arthritis Asthma Diabetes Gallstones Hyperlipidemia Hypothyroidism  Past Surgical History: Reviewed history from 04/11/2009 and no changes required. Cholecystectomy Hysterectomy Neck Surgery  Family History: Reviewed history from 04/17/2009 and no changes required. No FH of Colon Cancer: Family History of Diabetes: Mother Family History of Heart Disease: Brother deceased MI, ? Father MI  Social History: Reviewed history from 07/04/2009 and no changes required. 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 Quit in 2002 Daily Caffeine Use-1-2 cups daily Illicit Drug Use - no Alcohol Use - no Patient does not get regular exercise.   Review of Systems       The patient complains of fatigue and shortness of breath.  The patient denies allergy/sinus, anemia, anxiety-new, arthritis/joint pain, back pain, blood in urine, breast changes/lumps, change in vision, confusion, cough, coughing up blood, depression-new, fainting,  fever, headaches-new, hearing problems, heart murmur, heart rhythm changes, itching, menstrual pain, muscle pains/cramps, night sweats, nosebleeds, pregnancy symptoms, skin rash, sleeping problems, sore throat, swelling of feet/legs, swollen lymph glands, thirst - excessive , urination - excessive , urination changes/pain, urine  leakage, vision changes, and voice change.    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  And he is #1 repeat CBC today: If stable I will follow her blood counts on a monthly basis while continuing supplementary iron Orders: TLB-CBC Platelet - w/Differential ([id number])  Patient Instructions: 1)  CC Dr. Willey Blade

## 2010-07-04 NOTE — Discharge Summary (Signed)
Summary: ANEMIA  NAMENEAH, SPORRER            ACCOUNT NO.:  [id number]      MEDICAL RECORD NO.:  [id number]          PATIENT TYPE:  INP      LOCATION:  1505                         FACILITY:  [facility]      PHYSICIAN:  Mohan N. Sharyn Lull, M.D. DATE OF BIRTH:  [birth year]      DATE OF ADMISSION:  06/28/2009   DATE OF DISCHARGE:  06/30/2009                                  DISCHARGE SUMMARY      ADMITTING DIAGNOSES:   1. Severe symptomatic anemia.   2. History of documented arteriovenous malformation.   3. History of esophageal stricture with dilatation in the past.   4. Hypertension.   5. Hypothyroidism.   6. Diabetes mellitus.   7. History of tobacco abuse.   8. Degenerative joint disease.      FINAL DIAGNOSES:   1. Status post acute on chronic anemia likely due to slow       arteriovenous malformation-related blood loss.   2. Hypertension.   3. Diabetes mellitus.   4. Hypothyroidism.   5. History of esophageal stricture with dilatation.   6. History of tobacco abuse.   7. Degenerative joint disease.      DISCHARGE HOME MEDICATIONS:   1. Nu-Iron 150 mg 1 capsule b.i.d.   2. Os-Cal with vitamin D 1 tablet daily.   3. Glucophage 500 mg 1 tablet daily.   4. Magnesium oxide 1 tablet b.i.d.   5. Premarin 0.3 mg 1 tablet daily.   6. Neurontin 100 mg 1 capsule b.i.d.   7. Soma Compound 1 tablet t.i.d. as needed.   8. Prilosec 40 mg 1 capsule daily.   9. Singulair 10 mg 1 tablet daily.   10.Synthroid 150 mcg 1 tablet daily.      DIET:  Low-salt, low-cholesterol, 1800 calorie ADA diet.      ACTIVITY:  As tolerated.      FOLLOWUP:  Dr. Willey Blade in 2 weeks and Dr. Arlyce Dice in 4-6 weeks.      CONDITION ON DISCHARGE:  Stable.      Patient has been advised to go to ER if she develops black tarry stools   or feels weak, tired, fatigued.      BRIEF HISTORY:  Ms. Lacey Vega is a 60 year old black female who was seen by   Dr. August Saucer in his office complaining of progressive weakness over 1    month's duration.  Patient also complained of exertional dyspnea with   associated cramping sensation in the arms and legs.  This had progressed   to the point that she was not able to work on a consistent basis without   becoming extremely short of breath.  She denied any actual chest pain,   no diaphoresis.  Patient had previously been noted to have iron   deficiency anemia.  She has been evaluated by GI, most recently by Dr.   Russella Dar in November 2010.  Endoscopy and colonoscopy were unremarkable for   active disease.  She did require 2 units of transfusion in the past.   Patient did initially well since  that time.  She had been on iron   replacement therapy until recently.  Patient was noted to have   hemoglobin of 6.5 and was admitted for further evaluation.  Patient   states she had had capsule endoscopy in the past and was told she had   AVM.      PAST MEDICAL HISTORY:  She has history of hypertension, diabetes   mellitus, hypothyroidism, asthmatic bronchitis, history of esophageal   stricture, history of tobacco abuse in the past.  She also history of   abdominal aortic aneurysm.      ALLERGIES:  No known drug allergies.      SOCIAL HISTORY:  She is divorced.  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      MEDICATION AT HOME:   1. Ferrous sulfate 27 mg daily.   2. Os-Cal 1 tablet daily.   3. Vitamin B12 at 1000 mg daily.   4. Fish oil 1200 mg daily.   5. Glucophage 500 mg daily.   6. Magnesium oxide 400 mg b.i.d.   7. Prilosec 40 mg p.o. daily.   8. Soma 350 mg t.i.d.   9. Neurontin 100 mg p.o. daily.   10.Singulair 10 mg daily.      PHYSICAL EXAMINATION:  GENERAL:  She was alert, awake, oriented x3, in   no acute distress.   VITAL SIGNS:  Blood pressure was 109/68, pulse 73, respiration 20.   HEENT:  Her conjunctivae were pale.   NECK:  Supple, no JVD, no thyroid enlargement.   LUNGS:  She has decreased  breath sounds at bases.   CARDIOVASCULAR:  S1, S2 normal.  There was no S3 gallop.  There was soft   systolic murmur at lower left sternal border, no rub.   ABDOMEN:  Soft, bowel sounds present.  She had epigastric tenderness.   There was no mass appreciated.   EXTREMITIES:  There was no clubbing, cyanosis or edema.      LABORATORIES:  Admission hemoglobin was 6.6, hematocrit 19.8, white   count of 4.4.  Ferritin level was 14, iron 22 which was low.  Repeat   hemoglobin on June 29, 2009, was 8.8, hematocrit 27.4.  Today, her   hemoglobin is 10.5, hematocrit 32.8, white count of 5.6.  Sodium is 3.6,   BUN 10, creatinine 0.72, glucose 94.      BRIEF HOSPITAL COURSE:  Patient was admitted to regular floor and   received packed RBCs blood transfusion with appropriate increase in her   hemoglobin level.  Patient's hemoglobin remained stable.  Patient did   not have any   further episodes of GI bleeding.  Patient had had upper endoscopy,   colonoscopy and capsule endoscopy in the recent past.  Patient will be   discharged home on above medications and will be followed up by Dr. August Saucer   in 2 weeks and Dr. Arlyce Dice in 4-6 weeks.  Patient has been advised to   stay off aspirin and NSAIDs which she is aware of.               Eduardo Osier. Sharyn Lull, M.D.            MNH/MEDQ  D:  06/30/2009  T:  06/30/2009  Job:  [id number]

## 2010-07-04 NOTE — Assessment & Plan Note (Signed)
Summary: 1 MONTH F.U.Marland KitchenMarland KitchenEM   History of Present Illness Visit Type: Follow-up Visit Primary GI MD: Melvia Heaps MD [facility] Primary Provider: Willey Blade, MD Requesting Provider: n/a Chief Complaint: Patient here for f/u after endoscopy. She states that she still has dysphagia to solids and fluids as well as occasional reflux. History of Present Illness:   Lacey Vega has returned for followup of her dysphagia.  Despite repeat dilatation she continues to complain of dysphagia to solids and liquids.  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  A prominent cricopharyngeus muscle was also demonstrated.   GI Review of Systems    Reports dysphagia with liquids and  dysphagia with solids.      Denies abdominal pain, acid reflux, belching, bloating, chest pain, heartburn, loss of appetite, nausea, vomiting, vomiting blood, weight loss, and  weight gain.      Reports diarrhea.     Denies anal fissure, black tarry stools, change in bowel habit, constipation, diverticulosis, fecal incontinence, heme positive stool, hemorrhoids, irritable bowel syndrome, jaundice, light color stool, liver problems, rectal bleeding, and  rectal pain.    Current Medications (verified): 1)  Singulair 10 Mg Tabs (Montelukast Sodium) .Marland Kitchen.. 1 Tablet By Mouth Once Daily 2)  Centrum Silver  Tabs (Multiple Vitamins-Minerals) .Marland Kitchen.. 1 Tablet By Mouth Once Daily 3)  Oscal 500/200 D-3 500-200 Mg-Unit Tabs (Calcium-Vitamin D) .Marland Kitchen.. 1 Tablet By Mouth Once Daily 4)  Premarin 0.3 Mg Tabs (Estrogens Conjugated) .Marland Kitchen.. 1 Tablet By Mouth Once Daily 5)  Dexilant 60 Mg Cpdr (Dexlansoprazole) .... One Tablet By Mouth Once Daily 6)  Cinnamon 500 Mg Tabs (Cinnamon) .... Take 1 Tablet By Mouth Once A Day 7)  Fish Oil 1200 Mg Caps (Omega-3 Fatty Acids) .... Take 1 Capsule By Mouth Once A Day 8)  Metformin Hcl 500 Mg Tabs (Metformin Hcl) .... Take 1  Tablet By Mouth Once A Day 9)  Soma 350 Mg Tabs (Carisoprodol) .... Take 1 Tablet 3 Times A Day For Muscle Spasm 10)  Neurontin 100 Mg Caps (Gabapentin) .... Take 1 Tablet By Mouth Two Times A Day 11)  Magnesium Oxide 400 Mg Tabs (Magnesium Oxide) .... Take 1 Tablet By Mouth Two Times A Day 12)  Levothroid 150 Mcg Tabs (Levothyroxine Sodium) .... Take 1 Tablet By Mouth Once A Day 13)  Vitamin B-12 1000 Mcg Tabs (Cyanocobalamin) .... Take 1 Tablet By Mouth Once A Day 14)  Feosol 200 (65 Fe) Mg Tabs (Ferrous Sulfate Dried) .... Take 1 P.o. B.i.d. 15)  Hydrocodone-Acetaminophen 5-325 Mg Tabs (Hydrocodone-Acetaminophen) .... As Needed For Headache 16)  Metoprolol Tartrate 25 Mg Tabs (Metoprolol Tartrate) .... Take 1/2 Tablet By Mouth Two Times A Day  Allergies (verified): No Known Drug Allergies  Past History:  Past Medical History: Reviewed history from 04/17/2009 and no changes required. Esophageal Stricture Hyperplastic Colon Polyps GERD Anemia Anxiety Disorder Arthritis Asthma Diabetes Gallstones Hyperlipidemia Hypothyroidism  Past Surgical History: Reviewed history from 04/11/2009 and no changes required. Cholecystectomy Hysterectomy Neck Surgery  Family History: Reviewed history from 04/17/2009 and no changes required. No FH of Colon Cancer: Family History of Diabetes: Mother Family History of Heart Disease: Brother deceased MI, ? Father MI  Social History: Reviewed history from 07/04/2009 and no changes required. 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 Quit in 2002 Daily Caffeine Use-1-2 cups daily Illicit Drug Use - no Alcohol Use - no Patient  does not get regular exercise.   Review of Systems  The patient denies allergy/sinus, anemia, anxiety-new, arthritis/joint pain, back pain, blood in urine, breast changes/lumps, change in vision, confusion, cough, coughing up blood, depression-new, fainting, fatigue, fever,  headaches-new, hearing problems, heart murmur, heart rhythm changes, itching, menstrual pain, muscle pains/cramps, night sweats, nosebleeds, pregnancy symptoms, shortness of breath, skin rash, sleeping problems, sore throat, swelling of feet/legs, swollen lymph glands, thirst - excessive , urination - excessive , urination changes/pain, urine leakage, vision changes, and voice change.    Vital Signs:  Patient profile:   60 year old female Height:      65 inches Weight:      187.13 pounds BMI:     31.25 BSA:     1.92 Pulse rate:   76 / minute Pulse rhythm:   regular BP sitting:   112 / 72  (left arm)  Vitals Entered By: Lamona Curl CMA Duncan Dull) (January 03, 2010 9:26 AM)   Impression & Recommendations:  Problem # 1:  DYSPHAGIA UNSPECIFIED (ICD-787.20) Assessment Unchanged  I suspect that Lacey Vega may have a motility disorder that is causing her dysphagia.  Recommendations #1 esophageal manometry  Orders: Manometry (Manometry)  Patient Instructions: 1)  Copy sent to : Willey Blade, MD 2)  Your Esophageal Manometry is scheduled for 01/15/2010 at 11am.at [facility] END[facility] 3)  The medication list was reviewed and reconciled.  All changed / newly prescribed medications were explained.  A complete medication list was provided to the patient / caregiver.

## 2010-07-04 NOTE — Progress Notes (Signed)
Summary: F/U labs  ---- Converted from flag ---- ---- 08/03/2009 8:37 AM, Louis Meckel MD wrote: she needs cbc in 2 weeks ------------------------------  Phone Note Outgoing Call Call back at [facility] Phone [phone]   Call placed by: Merri Ray CMA Duncan Dull),  August 03, 2009 10:23 AM Summary of Call: Called pt to inform her of her labs and to follow up in 2 weeks  Follow-up for Phone Call        ok Follow-up by: Louis Meckel MD,  August 03, 2009 10:42 AM

## 2010-07-04 NOTE — Progress Notes (Signed)
Summary: Endo/Balloon Dil. Scheduled  Phone Note Outgoing Call   Call placed by: Laureen Ochs LPN,  Oct 09, 2009 3:44 PM Call placed to: Patient Summary of Call: Per Dr.Tyana Butzer/Ba Esophagram report, pt. needs an Endo/balloon Dil. scheduled. I have left a message with her daughter for pt. to callback. Initial call taken by: Laureen Ochs LPN,  Oct 09, 2009 3:44 PM  Follow-up for Phone Call        Message left for patient to callback. Laureen Ochs LPN  Oct 10, 2009 8:36 AM   Pt. scheduled procedure for 6-3-11at 9:30am at [facility]. All instructions reviewed w/pt. by phone and mailed to her. Pt. instructed to call back as needed.  Follow-up by: Laureen Ochs LPN,  Oct 10, 2009 10:19 AM

## 2010-07-04 NOTE — Letter (Signed)
Summary: EGD Instructions  [facility]  [address] [place], [place] [address]   Phone: [phone]  Fax: [phone]       Lacey Vega    02-Dec-1950    MRN: [id number]       Procedure Day /Date:  FRIDAY JUNE 3RD, 2011     Arrival Time:  8:30AM     Procedure Time: 9:30AM     Location of Procedure:                     [facility] ( Outpatient Registration)    PREPARATION FOR ENDOSCOPY/BALLOON DIL.    ON THE DAY OF THE PROCEDURE: FRIDAY JUNE 3RD, 2011  1.   No solid foods, milk or milk products are allowed after midnight the night before your procedure.  2.   Do not drink anything colored red or purple.  Avoid juices with pulp.  No orange juice.  3.  You may drink clear liquids until 5:30AM, which is 4 hours before your procedure.                                                                                                CLEAR LIQUIDS INCLUDE: Water Jello Ice Popsicles Tea (sugar ok, no milk/cream) Powdered fruit flavored drinks Coffee (sugar ok, no milk/cream) Gatorade Juice: apple, white grape, white cranberry  Lemonade Clear bullion, consomm, broth Carbonated beverages (any kind) Strained chicken noodle soup Hard Candy   MEDICATION INSTRUCTIONS  Unless otherwise instructed, you should take regular prescription medications with a small sip of water as early as possible the morning of your procedure.  ****Diabetic patients - Do not take your diabetic meds. the morning of 11-02-09.               OTHER INSTRUCTIONS  You will need a responsible adult at least 61 years of age to accompany you and drive you home.   This person must remain in the waiting room during your procedure.  Wear loose fitting clothing that is easily removed.  Leave jewelry and other valuables at home.  However, you may wish to bring a book to read or an iPod/MP3 player to listen to music as you wait for your procedure to start.  Remove all body  piercing jewelry and leave at home.  Total time from sign-in until discharge is approximately 2-3 hours.  You should go home directly after your procedure and rest.  You can resume normal activities the day after your procedure.  The day of your procedure you should not:   Drive   Make legal decisions   Operate machinery   Drink alcohol   Return to work  You will receive specific instructions about eating, activities and medications before you leave.    The above instructions have been reviewed and explained to [name] by phone and mailed to her.  Laureen Ochs LPN  Oct 10, 2009 10:25 AM      Appended Document: EGD Instructions Letter mailed to patient.

## 2010-07-04 NOTE — Progress Notes (Signed)
Summary: TRIAGE  Phone Note Call from Patient Call back at [phone]   Caller: Patient Summary of Call: patient got out of the hospital on 09/17/2008 and she is wanting to know if she needs to get labs done back on her regular schedule, if so she needs to know if she should go on 09/25/2009.  Initial call taken by: Harlow Mares CMA Duncan Dull),  September 24, 2009 12:37 PM  Follow-up for Phone Call        DR.KAPLAN: When does pt. need more labs and an office f/u with you? Follow-up by: Laureen Ochs LPN,  September 24, 2009 1:47 PM  Additional Follow-up for Phone Call Additional follow up Details #1::        next week Additional Follow-up by: Louis Meckel MD,  September 24, 2009 2:43 PM    Additional Follow-up for Phone Call Additional follow up Details #2::    Pt. will have labs done 10-03-09 and see Dr.Kaplan in the office 10-05-09 at 9:30am. Pt. instructed to call back as needed.  Follow-up by: Laureen Ochs LPN,  September 24, 2009 2:48 PM

## 2010-07-04 NOTE — Progress Notes (Signed)
Summary: CBC needed  Phone Note Outgoing Call Call back at Home Phone [phone]   Call placed by: Harlow Mares CMA Duncan Dull),  November 08, 2009 4:56 PM Call placed to: Patient Summary of Call: pt called back and she will be waiting for debs call back to know if she needs She is having a EGD 11/09/2009 i wil have Deb Dr. Arlyce Dice nurse call over to Endo and see if they draw a CBC when she is there for her procedure.  Initial call taken by: Harlow Mares CMA Duncan Dull),  November 08, 2009 4:58 PM  Follow-up for Phone Call        Claudie Revering RN at [facility] has the order for pt. to have a CBC drawn.  Follow-up by: Laureen Ochs LPN,  November 09, 2009 12:32 PM

## 2010-07-04 NOTE — Letter (Signed)
Summary: EGD/SMALL BOWEL ENTEROSCOPY/ERBY  [facility]  [address] [place], [place] [address]   Phone: [phone]  Fax: [phone]       Lacey Vega    April 06, 1951    MRN: [id number]       Procedure Day /Date:FRIDAY 07/06/2009     Arrival Time: 1:15PM     Procedure Time:2:15PM     Location of Procedure:                     [facility] ( Outpatient Registration)    PREPARATION FOR ENDOSCOPY/SMALL BOWEL ENTEROSCOPY/ERBY   On 07/06/2009 THE DAY OF THE PROCEDURE:  1.   No solid foods, milk or milk products are allowed after midnight the night before your procedure.  2.   Do not drink anything colored red or purple.  Avoid juices with pulp.  No orange juice.  3.  You may drink clear liquids until10:15AM , which is 4  hours before your procedure.                                                                                                CLEAR LIQUIDS INCLUDE: Water Jello Ice Popsicles Tea (sugar ok, no milk/cream) Powdered fruit flavored drinks Coffee (sugar ok, no milk/cream) Gatorade Juice: apple, white grape, white cranberry  Lemonade Clear bullion, consomm, broth Carbonated beverages (any kind) Strained chicken noodle soup Hard Candy   MEDICATION INSTRUCTIONS  Unless otherwise instructed, you should take regular prescription medications with a small sip of water as early as possible the morning of your procedure.  Diabetic patients - see separate instructions.METFORMIN          OTHER INSTRUCTIONS  You will need a responsible adult at least 61 years of age to accompany you and drive you home.   This person must remain in the waiting room during your procedure.  Wear loose fitting clothing that is easily removed.  Leave jewelry and other valuables at home.  However, you may wish to bring a book to read or an iPod/MP3 player to listen to music as you wait for your procedure to start.  Remove all body piercing jewelry and  leave at home.  Total time from sign-in until discharge is approximately 2-3 hours.  You should go home directly after your procedure and rest.  You can resume normal activities the day after your procedure.  The day of your procedure you should not:   Drive   Make legal decisions   Operate machinery   Drink alcohol   Return to work  You will receive specific instructions about eating, activities and medications before you leave.    The above instructions have been reviewed and explained to me by   _______________________    I fully understand and can verbalize these instructions _____________________________ Date _________

## 2010-07-04 NOTE — Progress Notes (Signed)
Summary: Triage  Phone Note Call from Patient Call back at [phone]   Caller: Patient Call For: Dr. Arlyce Dice Reason for Call: Lab or Test Results Summary of Call: Pt is calling about bloodwork results Initial call taken by: Karna Christmas,  August 06, 2009 11:28 AM  Follow-up for Phone Call         Discussed lab work with pt. Says I've had about enough of this low blood work .Encouraged to have repeat labs in 2 weeks as ordered.Asking what else can be done? Follow-up by: Teryl Lucy RN,  August 06, 2009 11:38 AM  Additional Follow-up for Phone Call Additional follow up Details #1::        Change Fe to feosol 200mg  two times a day (in lieu of cetrum silver) Additional Follow-up by: Louis Meckel MD,  August 06, 2009 3:06 PM    Additional Follow-up for Phone Call Additional follow up Details #2::     Pt. ntfd. of med. change andrx for [facility] sent to [facility] Follow-up by: Teryl Lucy RN,  August 06, 2009 3:37 PM  New/Updated Medications: FEOSOL 200 (65 FE) MG TABS (FERROUS SULFATE DRIED) Take 1 p.o. b.i.d. .  counts.    Prescriptions: FEOSOL 200 (65 FE) MG TABS (FERROUS SULFATE DRIED) Take 1 p.o. b.i.d.  #60 x 11   Entered by:   Teryl Lucy RN   Authorized by:   Louis Meckel MD   Signed by:   Teryl Lucy RN on 08/06/2009   Method used:   Electronically to        [facility]* (retail)       [address]       [address] Shipping/mailing       [place], [place]  [address]       Ph: [phone]       Fax: [phone]   RxID:   [id number]

## 2010-08-18 LAB — CROSSMATCH
ABO/RH(D): A POS
Antibody Screen: NEGATIVE

## 2010-08-18 LAB — CBC
HCT: 19.8 % — ABNORMAL LOW (ref 36.0–46.0)
HCT: 27.4 % — ABNORMAL LOW (ref 36.0–46.0)
HCT: 32.8 % — ABNORMAL LOW (ref 36.0–46.0)
Hemoglobin: 10.5 g/dL — ABNORMAL LOW (ref 12.0–15.0)
Hemoglobin: 6.6 g/dL — CL (ref 12.0–15.0)
MCHC: 32.1 g/dL (ref 30.0–36.0)
Platelets: 253 10*3/uL (ref 150–400)
RBC: 3.74 MIL/uL — ABNORMAL LOW (ref 3.87–5.11)
RDW: 17.2 % — ABNORMAL HIGH (ref 11.5–15.5)
WBC: 4.4 10*3/uL (ref 4.0–10.5)
WBC: 5 10*3/uL (ref 4.0–10.5)

## 2010-08-18 LAB — BASIC METABOLIC PANEL
BUN: 10 mg/dL (ref 6–23)
Creatinine, Ser: 0.73 mg/dL (ref 0.4–1.2)
GFR calc non Af Amer: >60 mL/min (ref >60)
Glucose, Bld: 89 mg/dL (ref 70–99)
Potassium: 3.5 mEq/L (ref 3.5–5.1)

## 2010-08-18 LAB — HEMOGLOBIN AND HEMATOCRIT, BLOOD
HCT: 28.7 % — ABNORMAL LOW (ref 36.0–46.0)
Hemoglobin: 9.1 g/dL — ABNORMAL LOW (ref 12.0–15.0)

## 2010-08-18 LAB — GLUCOSE, CAPILLARY
Glucose-Capillary: 110 mg/dL — ABNORMAL HIGH (ref 70–99)
Glucose-Capillary: 119 mg/dL — ABNORMAL HIGH (ref 70–99)
Glucose-Capillary: 63 mg/dL — ABNORMAL LOW (ref 70–99)
Glucose-Capillary: 82 mg/dL (ref 70–99)
Glucose-Capillary: 84 mg/dL (ref 70–99)
Glucose-Capillary: 87 mg/dL (ref 70–99)
Glucose-Capillary: 94 mg/dL (ref 70–99)

## 2010-08-18 LAB — COMPREHENSIVE METABOLIC PANEL
ALT: 12 U/L (ref 0–35)
Alkaline Phosphatase: 70 U/L (ref 39–117)
Alkaline Phosphatase: 74 U/L (ref 39–117)
BUN: 10 mg/dL (ref 6–23)
BUN: 11 mg/dL (ref 6–23)
CO2: 27 mEq/L (ref 19–32)
Chloride: 109 mEq/L (ref 96–112)
GFR calc non Af Amer: >60 mL/min (ref >60)
GFR calc non Af Amer: >60 mL/min (ref >60)
Glucose, Bld: 117 mg/dL — ABNORMAL HIGH (ref 70–99)
Glucose, Bld: 94 mg/dL (ref 70–99)
Potassium: 3.2 mEq/L — ABNORMAL LOW (ref 3.5–5.1)
Potassium: 3.6 mEq/L (ref 3.5–5.1)
Sodium: 140 mEq/L (ref 135–145)
Total Bilirubin: 1 mg/dL (ref 0.3–1.2)

## 2010-08-18 LAB — DIFFERENTIAL
Eosinophils Absolute: 0.2 10*3/uL (ref 0.0–0.7)
Monocytes Absolute: 0.4 10*3/uL (ref 0.1–1.0)
Neutrophils Relative %: 61 % (ref 43–77)

## 2010-08-18 LAB — FERRITIN: Ferritin: 14 ng/mL (ref 10–291)

## 2010-08-19 LAB — CBC
HCT: 29 % — ABNORMAL LOW (ref 36.0–46.0)
MCHC: 33.6 g/dL (ref 30.0–36.0)
MCV: 95.1 fL (ref 78.0–100.0)
Platelets: 261 10*3/uL (ref 150–400)
RDW: 15.4 % (ref 11.5–15.5)

## 2010-08-19 LAB — GLUCOSE, CAPILLARY: Glucose-Capillary: 95 mg/dL (ref 70–99)

## 2010-08-20 LAB — COMPREHENSIVE METABOLIC PANEL
ALT: 11 U/L (ref 0–35)
AST: 16 U/L (ref 0–37)
Alkaline Phosphatase: 60 U/L (ref 39–117)
BUN: 11 mg/dL (ref 6–23)
CO2: 28 mEq/L (ref 19–32)
Calcium: 9.2 mg/dL (ref 8.4–10.5)
Calcium: 9.3 mg/dL (ref 8.4–10.5)
Chloride: 107 mEq/L (ref 96–112)
Creatinine, Ser: 0.72 mg/dL (ref 0.4–1.2)
Creatinine, Ser: 0.9 mg/dL (ref 0.4–1.2)
GFR calc Af Amer: >60 mL/min (ref >60)
GFR calc non Af Amer: >60 mL/min (ref >60)
Glucose, Bld: 92 mg/dL (ref 70–99)
Glucose, Bld: 92 mg/dL (ref 70–99)
Potassium: 3.9 mEq/L (ref 3.5–5.1)
Sodium: 141 mEq/L (ref 135–145)
Total Bilirubin: 1.9 mg/dL — ABNORMAL HIGH (ref 0.3–1.2)
Total Protein: 6.7 g/dL (ref 6.0–8.3)

## 2010-08-20 LAB — HAPTOGLOBIN: Haptoglobin: 43 mg/dL (ref 16–200)

## 2010-08-20 LAB — CARDIAC PANEL(CRET KIN+CKTOT+MB+TROPI)
CK, MB: 0.6 ng/mL (ref 0.3–4.0)
Relative Index: INVALID (ref 0.0–2.5)
Total CK: 34 U/L (ref 7–177)
Troponin I: 0.01 ng/mL (ref 0.00–0.06)

## 2010-08-20 LAB — CBC
Hemoglobin: 11 g/dL — ABNORMAL LOW (ref 12.0–15.0)
MCHC: 34.6 g/dL (ref 30.0–36.0)
MCHC: 34.8 g/dL (ref 30.0–36.0)
MCV: 91.1 fL (ref 78.0–100.0)
Platelets: 199 10*3/uL (ref 150–400)
Platelets: 219 10*3/uL (ref 150–400)
RBC: 3.46 MIL/uL — ABNORMAL LOW (ref 3.87–5.11)
RBC: 3.54 MIL/uL — ABNORMAL LOW (ref 3.87–5.11)
RDW: 16 % — ABNORMAL HIGH (ref 11.5–15.5)
WBC: 5.1 10*3/uL (ref 4.0–10.5)
WBC: 5.5 10*3/uL (ref 4.0–10.5)

## 2010-08-20 LAB — BASIC METABOLIC PANEL
BUN: 11 mg/dL (ref 6–23)
Calcium: 9.3 mg/dL (ref 8.4–10.5)
Creatinine, Ser: 0.82 mg/dL (ref 0.4–1.2)
GFR calc Af Amer: >60 mL/min (ref >60)
GFR calc non Af Amer: >60 mL/min (ref >60)

## 2010-08-20 LAB — CK TOTAL AND CKMB (NOT AT ARMC)
Relative Index: INVALID (ref 0.0–2.5)
Total CK: 63 U/L (ref 7–177)

## 2010-08-20 LAB — LIPID PANEL
Cholesterol: 185 mg/dL (ref 0–200)
LDL Cholesterol: 126 mg/dL — ABNORMAL HIGH (ref 0–99)
Total CHOL/HDL Ratio: 4.3 RATIO
Triglycerides: 81 mg/dL (ref <150)

## 2010-08-20 LAB — DIFFERENTIAL
Basophils Absolute: 0 10*3/uL (ref 0.0–0.1)
Eosinophils Absolute: 0.4 10*3/uL (ref 0.0–0.7)
Eosinophils Relative: 8 % — ABNORMAL HIGH (ref 0–5)
Neutrophils Relative %: 59 % (ref 43–77)

## 2010-08-21 LAB — CROSSMATCH

## 2010-08-21 LAB — CBC
Hemoglobin: 8.5 g/dL — ABNORMAL LOW (ref 12.0–15.0)
RDW: 15.6 % — ABNORMAL HIGH (ref 11.5–15.5)
WBC: 4.6 10*3/uL (ref 4.0–10.5)

## 2010-08-21 LAB — COMPREHENSIVE METABOLIC PANEL
ALT: 13 U/L (ref 0–35)
Albumin: 3.5 g/dL (ref 3.5–5.2)
Alkaline Phosphatase: 59 U/L (ref 39–117)
Glucose, Bld: 110 mg/dL — ABNORMAL HIGH (ref 70–99)
Potassium: 3.1 mEq/L — ABNORMAL LOW (ref 3.5–5.1)
Sodium: 141 mEq/L (ref 135–145)
Total Protein: 6.6 g/dL (ref 6.0–8.3)

## 2010-08-21 LAB — CARDIAC PANEL(CRET KIN+CKTOT+MB+TROPI)
CK, MB: 1.7 ng/mL (ref 0.3–4.0)
Total CK: 77 U/L (ref 7–177)

## 2010-08-21 LAB — DIFFERENTIAL
Basophils Relative: 0 % (ref 0–1)
Eosinophils Absolute: 0.3 10*3/uL (ref 0.0–0.7)
Monocytes Absolute: 0.5 10*3/uL (ref 0.1–1.0)
Neutro Abs: 2.7 10*3/uL (ref 1.7–7.7)

## 2010-08-21 LAB — ABO/RH: ABO/RH(D): A POS

## 2010-08-25 LAB — CROSSMATCH
Antibody Screen: NEGATIVE
Antibody Screen: NEGATIVE

## 2010-08-25 LAB — GLUCOSE, CAPILLARY: Glucose-Capillary: 99 mg/dL (ref 70–99)

## 2010-08-29 ENCOUNTER — Encounter: Payer: Self-pay | Admitting: Internal Medicine

## 2010-08-30 ENCOUNTER — Encounter: Payer: Self-pay | Admitting: Internal Medicine

## 2010-09-05 LAB — COMPREHENSIVE METABOLIC PANEL
Albumin: 3.3 g/dL — ABNORMAL LOW (ref 3.5–5.2)
BUN: 14 mg/dL (ref 6–23)
Calcium: 9.6 mg/dL (ref 8.4–10.5)
Chloride: 99 mEq/L (ref 96–112)
Creatinine, Ser: 0.85 mg/dL (ref 0.4–1.2)
Total Bilirubin: 0.5 mg/dL (ref 0.3–1.2)

## 2010-09-05 LAB — DIFFERENTIAL
Basophils Absolute: 0.1 10*3/uL (ref 0.0–0.1)
Lymphs Abs: 1.5 10*3/uL (ref 0.7–4.0)
Monocytes Absolute: 0.5 10*3/uL (ref 0.1–1.0)
Monocytes Relative: 9 % (ref 3–12)

## 2010-09-05 LAB — CROSSMATCH: Antibody Screen: NEGATIVE

## 2010-09-05 LAB — PROTIME-INR: INR: 0.94 (ref 0.00–1.49)

## 2010-09-05 LAB — URINALYSIS, ROUTINE W REFLEX MICROSCOPIC
Bilirubin Urine: NEGATIVE
Glucose, UA: NEGATIVE mg/dL
Hgb urine dipstick: NEGATIVE
Protein, ur: NEGATIVE mg/dL
Urobilinogen, UA: 0.2 mg/dL (ref 0.0–1.0)

## 2010-09-05 LAB — CBC
MCHC: 31.4 g/dL (ref 30.0–36.0)
MCHC: 31.7 g/dL (ref 30.0–36.0)
MCV: 69 fL — ABNORMAL LOW (ref 78.0–100.0)
Platelets: 410 10*3/uL — ABNORMAL HIGH (ref 150–400)
RBC: 3.34 MIL/uL — ABNORMAL LOW (ref 3.87–5.11)
RBC: 4.12 MIL/uL (ref 3.87–5.11)
RDW: 16.7 % — ABNORMAL HIGH (ref 11.5–15.5)
RDW: 21.2 % — ABNORMAL HIGH (ref 11.5–15.5)

## 2010-09-05 LAB — H. PYLORI ANTIBODY, IGG: H Pylori IgG: 0.6 {ISR}

## 2010-09-05 LAB — VITAMIN B12: Vitamin B-12: 1348 pg/mL — ABNORMAL HIGH (ref 211–911)

## 2010-09-05 LAB — T4, FREE: Free T4: 1.26 ng/dL (ref 0.80–1.80)

## 2010-09-05 LAB — URINE CULTURE: Culture: NO GROWTH

## 2010-09-05 LAB — CEA: CEA: <0.5 ng/mL (ref 0.0–5.0)

## 2010-09-05 LAB — HEMOCCULT GUIAC POC 1CARD (OFFICE): Fecal Occult Bld: POSITIVE

## 2010-09-05 LAB — AMYLASE: Amylase: 98 U/L (ref 27–131)

## 2010-09-05 LAB — APTT: aPTT: 30 seconds (ref 24–37)

## 2010-10-07 ENCOUNTER — Telehealth: Payer: Self-pay | Admitting: Gastroenterology

## 2010-10-07 ENCOUNTER — Encounter: Payer: Self-pay | Admitting: Internal Medicine

## 2010-10-07 DIAGNOSIS — E039 Hypothyroidism, unspecified: Secondary | ICD-10-CM | POA: Insufficient documentation

## 2010-10-07 NOTE — Telephone Encounter (Signed)
Pt has been having problems with some abdominal pain. Dr. August Saucer would like for the pt to be seen by GI. Appt given for pt to see Dr. Arlyce Dice 10/16/10@10 :30am. Scheduled with Lucendia Herrlich at Dr. Diamantina Providence office.

## 2010-10-15 NOTE — Procedures (Signed)
DUPLEX ULTRASOUND OF ABDOMINAL AORTA   INDICATION:  Followup of abdominal aortic aneurysm.   HISTORY:  Diabetes:  Yes.  Cardiac:  No.  Hypertension:  Yes.  Smoking:  Previous.  Connective Tissue Disorder:  Family History:  No.  Previous Surgery:  No.   DUPLEX EXAM:         AP (cm)                   TRANSVERSE (cm)  Proximal             2.1 cm                    2.2 cm  Mid                  2.1 cm                    2.4 cm  Distal               4.2 cm                    4.1 cm  Right Iliac          1.3 cm                    1.4 cm  Left Iliac           1.0 cm                    1.6 cm   PREVIOUS:  Date:  AP:  4.4  TRANSVERSE:  4.5   IMPRESSION:  Stable abdominal aortic aneurysm within the distal aorta  with the largest measurement today of 4.2 x 4.1 cm with intraluminal  thrombus and atherosclerosis seen.  Iliacs appear stable in size with no  aneurysmal dilatation seen.  Atherosclerosis seen within both iliacs.   ___________________________________________  Di Kindle. Edilia Bo, M.D.   OD/MEDQ  D:  05/31/2010  T:  05/31/2010  Job:  [id number]

## 2010-10-15 NOTE — Consult Note (Signed)
VASCULAR SURGERY CONSULTATION   Lacey Vega, Lacey Vega  DOB:  01-Aug-1950                                       03/28/2008  [id number]#:[id number]   I saw the patient in the office today in consultation concerning a 4.2  cm abdominal aortic aneurysm.  She was referred by Dr. Gerlene Fee.  This is  a pleasant 60 year old woman who has had a 2-3 month history of low back  pain.  This radiates down the posterior aspect of both legs and it  occurs in both the right and the left side.  She feels that her symptoms  are aggravated by walking.  There really are no alleviating factors.  She has had no abdominal pain.  She also complains of some generalized  arthritis.  I get no clear-cut history of claudication, rest pain or  nonhealing ulcers.  She does occasionally get cramps.  As part of her  workup for this she underwent a lumbar myelogram which showed shallow  central and leftward protrusion of L4-L5 with mild to moderate facet  arthropathy.  There was mild displacement of the left L5 nerve root.  An  incidental finding was a 4.1 cm infrarenal abdominal aortic aneurysm.  She was sent for vascular consultation concerning the aneurysm.  She is  unaware of any history of aneurysmal disease in her family.   PAST MEDICAL HISTORY:  Her past medical history is significant for non-  insulin-dependent diabetes, hypertension and hypercholesterolemia.  She  denies any history of previous myocardial infarction, history of  congestive heart failure or history of COPD.   FAMILY HISTORY:  Her mother had a pacemaker.  Her father had a heart  attack in his 45s.  She had a brother who had a heart attack in his 60s.  Again, there is no history of aneurysmal disease in the family.   SOCIAL HISTORY:  She has two children.  She works in Special educational needs teacher.  She quit tobacco 6 years ago.   REVIEW OF SYSTEMS AND MEDICATIONS:  Are documented on the medical  history form in her chart.   PHYSICAL  EXAMINATION:  General:  This is a woman who appears her stated  age.  Vital signs:  Blood pressure 115/70, heart rate is 69.  Neck:  Is  supple.  There is no cervical lymphadenopathy.  I do not detect any  carotid bruits.  Lungs:  Are clear bilaterally to auscultation.  Cardiac:  She has a regular rate and rhythm.  Abdomen:  Soft and  nontender.  The aneurysm is palpable and nontender.  She has palpable  femoral, popliteal and dorsalis pedis pulses bilaterally.  Both feet are  warm and well-perfused.  She has no significant lower extremity  swelling.  She has no evidence of atheroembolic disease.  Neurological:  Exam is nonfocal.   I have reviewed her CT scan and she has an infrarenal abdominal aortic  aneurysm that measures 4.2 cm in maximum diameter.  The maximum diameter  that I obtained was 4.2 cm.  There is a reasonable neck below her  renals.   I have explained that the American Heart Association recommendations  would be to fix an aneurysm in a normal risk patient at 5.5 cm.  Hers is  well away from this.  I have recommend followup ultrasound at 6 months  and I will see  her back at that time.  She understood that the two major  risk factors for enlargement of her aneurysm are tobacco use and poorly  controlled blood pressure.  She does not smoke and her blood pressure  appears well-controlled.   I will see her back in 6 months.  She knows to call sooner if she has  problems.   Di Kindle. Edilia Bo, M.D.  Electronically Signed  CSD/MEDQ  D:  03/28/2008  T:  03/29/2008  Job:  [id number]   cc:   Minerva Areola L. August Saucer, M.D.  Reinaldo Meeker, M.D.

## 2010-10-15 NOTE — Assessment & Plan Note (Signed)
OFFICE VISIT   LANNA, LABELLA  DOB:  02-11-1951                                       09/12/2008  [id number]#:[id number]   I saw the patient in the only office today for continued followup of her  small abdominal aortic aneurysm.  I had originally seen her in  consultation in October of 2009 with a 4.2 cm infrarenal abdominal  aortic aneurysm.  She had been referred by Dr. Gerlene Fee.  She had had a 2-  3 month history of low back pain and the aneurysm was an incidental  finding.  She does have significant lumbar disk disease.   Since I saw her last she did have some injection treatments in her back  which helped temporarily.  She has had no new onset abdominal or back  pain.   REVIEW OF SYSTEMS:  On review of systems she has had no chest pain,  chest pressure, palpitations or arrhythmias.  She has had no productive  cough, bronchitis, asthma or wheezing.   SOCIAL HISTORY:  She quit tobacco about 6 or 7 years ago.   PHYSICAL EXAMINATION:  This is a pleasant 60 year old woman who appears  her stated age.  Blood pressure is 102/69, heart rate is 67.  Lungs are  clear bilaterally to auscultation.  On cardiac exam she has a regular  rate and rhythm.  Abdomen is soft and nontender.  She is somewhat obese  and it is difficult to palpate her aneurysm.  She does have palpable  femoral and pedal pulses bilaterally.  She has no evidence of  atheroembolic disease.   Ultrasound scan in our office today shows the maximum diameter of her  aneurysm is 4.3 cm and has thus not changed significantly over the last  6 months.   Again I explained we generally would not consider elective repair of the  aneurysm unless it reached 5.5 cm in maximum diameter.  I plan on seeing  her back in 6 months with a followup ultrasound.  She knows to call  sooner if she has problems.   Di Kindle. Edilia Bo, M.D.  Electronically Signed   CSD/MEDQ  D:  09/12/2008  T:  09/13/2008  Job:   [id number]   cc:   Minerva Areola L. August Saucer, M.D.  Reinaldo Meeker, M.D.

## 2010-10-15 NOTE — Procedures (Signed)
DUPLEX ULTRASOUND OF ABDOMINAL AORTA   INDICATION:  Abdominal aortic aneurysm.   HISTORY:  Diabetes:  Yes.  Cardiac:  No.  Hypertension:  Yes.  Smoking:  Previous.  Connective Tissue Disorder:  Family History:  No.  Previous Surgery:  No.   DUPLEX EXAM:         AP (cm)                   TRANSVERSE (cm)  Proximal             2.8 cm                    2.7 cm  Mid                  2.7 cm                    2.3 cm  Distal               4.4 cm                    4.5 cm  Right Iliac          Not visualized            Not visualized  Left Iliac           Not visualized            Not visualized   PREVIOUS:  Date: 04/06/2009  AP:  4.18  TRANSVERSE:  4.21   IMPRESSION:  1. Aneurysm of the distal abdominal aorta with no significant change      in the maximum diameter when compared to the previous examination.  2. Unable to adequately visualize the bilateral common iliac arteries      due to overlying bowel gas patterns.   ___________________________________________  Di Kindle. Edilia Bo, M.D.   CH/MEDQ  D:  10/25/2009  T:  10/25/2009  Job:  [id number]

## 2010-10-15 NOTE — Procedures (Signed)
DUPLEX ULTRASOUND OF ABDOMINAL AORTA   INDICATION:  Abdominal aortic aneurysm.   HISTORY:  Diabetes:  No.  Cardiac:  No.  Hypertension:  Yes.  Smoking:  Previous.  Connective Tissue Disorder:  Family History:  No.  Previous Surgery:   DUPLEX EXAM:         AP (cm)                   TRANSVERSE (cm)  Proximal             2.9 cm                    2.6 cm  Mid                  2.7 cm                    2.2 cm  Distal               4.1 cm                    4.3 cm  Right Iliac          Not visualized.           Not visualized.  Left Iliac           Not visualized.           Not visualized.   PREVIOUS:  Date: 03/02/2008 (CT)  AP:  4.1  TRANSVERSE:  4.1   IMPRESSION:  Aneurysm of the distal abdominal aorta with no significant  change in maximum diameter noted when compared to previous CT.   Unable to adequately visualized the bilateral common iliac arteries due  to overlying bowel gas patterns.    ___________________________________________  Di Kindle.  Edilia Bo, M.D.  CH/MEDQ  D:  09/12/2008  T:  09/12/2008  Job:  [id number]

## 2010-10-15 NOTE — Procedures (Signed)
DUPLEX ULTRASOUND OF ABDOMINAL AORTA   INDICATION:  Followup abdominal aortic aneurysm.   HISTORY:  Diabetes:  Yes.  Cardiac:  No.  Hypertension:  Yes.  Smoking:  Previous.  Connective Tissue Disorder:  Family History:  No.  Previous Surgery:  No.   DUPLEX EXAM:         AP (cm)                   TRANSVERSE (cm)  Proximal             2.58 cm                   2.69 cm  Mid                  3.57 cm                   3.56 cm  Distal               4.18 cm                   4.21 cm  Right Iliac          Not visualized            Not visualized  Left Iliac           Not visualized            Not visualized   PREVIOUS:  Date: 09/12/2008  AP:  4.1  TRANSVERSE:  4.3   IMPRESSION:  1. Stable abdominal aortic aneurysm with largest measurement of 4.18      cm x 4.21 cm.  2. Unable to visualize well bilateral common iliac arteries due to      bowel gas and body habitus.   ___________________________________________  Di Kindle. Edilia Bo, M.D.   AS/MEDQ  D:  04/06/2009  T:  04/06/2009  Job:  [id number]

## 2010-10-16 ENCOUNTER — Encounter: Payer: Self-pay | Admitting: Gastroenterology

## 2010-10-16 ENCOUNTER — Ambulatory Visit (INDEPENDENT_AMBULATORY_CARE_PROVIDER_SITE_OTHER): Payer: Commercial Managed Care - PPO | Admitting: Gastroenterology

## 2010-10-16 DIAGNOSIS — R1310 Dysphagia, unspecified: Secondary | ICD-10-CM

## 2010-10-16 DIAGNOSIS — K5521 Angiodysplasia of colon with hemorrhage: Secondary | ICD-10-CM

## 2010-10-16 NOTE — Assessment & Plan Note (Addendum)
I suspect that she may have a motility disorder such as early achalasia.  Recommendations  #1 in lieu of the esophageal manometry I will proceed with endoscopy with Botox injection of her LES

## 2010-10-16 NOTE — Progress Notes (Signed)
History of Present Illness:  [name] has returned for followup of her swallowing difficulties. Although esophageal manometry was recommended she is frightened to undergo the test. She continues to complain of dysphagia to solids and liquids. She also complains of some soreness in the subxiphoid area to palpation.    Review of Systems: Pertinent positive and negative review of systems were noted in the above HPI section. All other review of systems were otherwise negative.    Current Medications, Allergies, Past Medical History, Past Surgical History, Family History and Social History were reviewed in [employer] electronic medical record  Vital signs were reviewed in today's medical record. Physical Exam: General: Well developed , well nourished, no acute distress

## 2010-10-16 NOTE — Patient Instructions (Signed)
Your Endoscopy is scheduled for 11/11/2010 at 8:30am at [facility].

## 2010-11-11 ENCOUNTER — Ambulatory Visit: Payer: Commercial Managed Care - PPO | Admitting: Gastroenterology

## 2010-11-11 ENCOUNTER — Ambulatory Visit (HOSPITAL_COMMUNITY)
Admission: RE | Admit: 2010-11-11 | Discharge: 2010-11-11 | Disposition: A | Discharge status: Home / Self Care | Payer: Commercial Managed Care - PPO | Source: Ambulatory Visit | Attending: Gastroenterology | Admitting: Gastroenterology

## 2010-11-11 DIAGNOSIS — R1310 Dysphagia, unspecified: Secondary | ICD-10-CM

## 2010-11-11 DIAGNOSIS — K222 Esophageal obstruction: Secondary | ICD-10-CM

## 2010-11-11 DIAGNOSIS — K225 Diverticulum of esophagus, acquired: Secondary | ICD-10-CM | POA: Insufficient documentation

## 2010-11-11 LAB — GLUCOSE, CAPILLARY: Glucose-Capillary: 101 mg/dL — ABNORMAL HIGH (ref 70–99)

## 2010-12-03 ENCOUNTER — Observation Stay (HOSPITAL_COMMUNITY)
Admission: AD | Admit: 2010-12-03 | Discharge: 2010-12-04 | Disposition: A | Discharge status: Home / Self Care | Payer: 59 | Source: Ambulatory Visit | Attending: Internal Medicine | Admitting: Internal Medicine

## 2010-12-03 DIAGNOSIS — E119 Type 2 diabetes mellitus without complications: Secondary | ICD-10-CM | POA: Insufficient documentation

## 2010-12-03 DIAGNOSIS — Z79899 Other long term (current) drug therapy: Secondary | ICD-10-CM | POA: Insufficient documentation

## 2010-12-03 DIAGNOSIS — R209 Unspecified disturbances of skin sensation: Secondary | ICD-10-CM | POA: Insufficient documentation

## 2010-12-03 DIAGNOSIS — R252 Cramp and spasm: Secondary | ICD-10-CM | POA: Insufficient documentation

## 2010-12-03 DIAGNOSIS — R5381 Other malaise: Secondary | ICD-10-CM | POA: Insufficient documentation

## 2010-12-03 DIAGNOSIS — D509 Iron deficiency anemia, unspecified: Principal | ICD-10-CM | POA: Insufficient documentation

## 2010-12-03 DIAGNOSIS — I10 Essential (primary) hypertension: Secondary | ICD-10-CM | POA: Insufficient documentation

## 2010-12-03 DIAGNOSIS — R5383 Other fatigue: Secondary | ICD-10-CM | POA: Insufficient documentation

## 2010-12-03 DIAGNOSIS — E039 Hypothyroidism, unspecified: Secondary | ICD-10-CM | POA: Insufficient documentation

## 2010-12-04 LAB — CROSSMATCH
ABO/RH(D): A POS
Antibody Screen: NEGATIVE
Unit division: 0

## 2010-12-04 LAB — IRON AND TIBC
Iron: 68 ug/dL (ref 42–135)
Saturation Ratios: 16 % — ABNORMAL LOW (ref 20–55)
TIBC: 425 ug/dL (ref 250–470)
UIBC: 357 ug/dL

## 2010-12-04 LAB — CBC
HCT: 34.7 % — ABNORMAL LOW (ref 36.0–46.0)
MCHC: 31.1 g/dL (ref 30.0–36.0)
MCV: 80.5 fL (ref 78.0–100.0)
RDW: 16.6 % — ABNORMAL HIGH (ref 11.5–15.5)

## 2010-12-04 LAB — COMPREHENSIVE METABOLIC PANEL
Albumin: 3.4 g/dL — ABNORMAL LOW (ref 3.5–5.2)
BUN: 16 mg/dL (ref 6–23)
Creatinine, Ser: 0.94 mg/dL (ref 0.50–1.10)
Potassium: 4.1 mEq/L (ref 3.5–5.1)
Total Protein: 7 g/dL (ref 6.0–8.3)

## 2010-12-04 LAB — DIFFERENTIAL
Basophils Absolute: 0 10*3/uL (ref 0.0–0.1)
Eosinophils Relative: 4 % (ref 0–5)
Lymphocytes Relative: 29 % (ref 12–46)
Monocytes Absolute: 0.6 10*3/uL (ref 0.1–1.0)

## 2010-12-04 LAB — FERRITIN: Ferritin: 21 ng/mL (ref 10–291)

## 2010-12-10 ENCOUNTER — Encounter: Payer: Self-pay | Admitting: Gastroenterology

## 2010-12-10 ENCOUNTER — Ambulatory Visit (INDEPENDENT_AMBULATORY_CARE_PROVIDER_SITE_OTHER): Payer: Commercial Managed Care - PPO | Admitting: Gastroenterology

## 2010-12-10 VITALS — BP 112/64 | HR 80 | Ht 64.0 in | Wt 196.0 lb

## 2010-12-10 DIAGNOSIS — R1310 Dysphagia, unspecified: Secondary | ICD-10-CM

## 2010-12-10 NOTE — Patient Instructions (Signed)
Please contact the office when you have an episode of dysphagia and ask to speak with a nurse

## 2010-12-10 NOTE — Assessment & Plan Note (Signed)
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 I emphasized the necessity of doing this after manometry prior to doing a more permanent procedure such as a forceful balloon dilatation or a myotomy. She was carefully instructed to contact me when dysphagia recurs at which point I will try to do manometry. Failing  that I would repeat her Botox injection.

## 2010-12-10 NOTE — Progress Notes (Signed)
History of Present Illness:  Ms. Conover has returned following Botox injection of her LES. She has refused esophageal manometry but agreed to try therapy with Botox for suspicions of achalasia. She reports complete resolution of her dysphagia to solids and liquids.    Review of Systems: Pertinent positive and negative review of systems were noted in the above HPI section. All other review of systems were otherwise negative.    Current Medications, Allergies, Past Medical History, Past Surgical History, Family History and Social History were reviewed in [employer] electronic medical record  Vital signs were reviewed in today's medical record. Physical Exam: General: Well developed , well nourished, no acute distress t

## 2010-12-16 NOTE — H&P (Signed)
Lacey Vega, Lacey Vega            ACCOUNT NO.:  [id number]  MEDICAL RECORD NO.:  [id number]  LOCATION:  1313                         FACILITY:  [facility]  PHYSICIAN:  Karon Heckendorn L. August Saucer, M.D.     DATE OF BIRTH:  Jul 24, 1950  DATE OF ADMISSION:  12/03/2010 DATE OF DISCHARGE:                             HISTORY & PHYSICAL   CHIEF COMPLAINT:  Progressive weakness, orthostasis with worsening anemia.  HISTORY OF PRESENT ILLNESS:  This is one of several [facility] admissions for this 60 year old divorced black female with history of hypertension, asthmatic bronchitis, hypothyroidism and diabetes mellitus.  She, however, has been known most recently to have recurrent problems with iron-deficiency anemia associated with documented arteriovenous malformations with GI bleeds.  She has been monitored by Dr. Arlyce Dice of gastroenterology.  She also has been known to have esophageal strictures requiring periodic dilatation.  Patient was seen in the office on December 02, 2010 complaining of increasing weakness. She notes she has been more fatigued over the past several weeks.  She has also had increasing cramps in her lower extremities as well.  She denied hematemesis, melena or hematochezia.  She noted she had been taking her iron supplements three times a day as directed.  Despite this, her hemoglobin was noted to be 8.8.  1 month prior to this, it was 9.9.  Patient with orthostatic in the office as well.  She is subsequently for transfusion.  PAST MEDICAL HISTORY:  As noted above.  She is known also to have history of asthmatic bronchitis.  Past history of tobacco abuse, which she stopped approximately 9 years ago.  Diabetes mellitus and hypothyroidism as noted.  History is also significant for having abdominal aneurysm, for which she has been followed by vascular surgery as well.  PAST SURGICAL HISTORY:  Notable for being status post hysterectomy and cholecystectomy.  FAMILY HISTORY:   Positive for heart disease and diabetes mellitus.  SOCIAL HISTORY:  Patient continues to work at [employer] 8 hours a day. 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  She acknowledges she has had increasing difficulty managing this due to ongoing fatigue.  ALLERGIES:  Patient has no known allergies.  REVIEW OF SYSTEMS:  As noted above.  She had dysesthesias in the hands bilaterally.  Leg cramps worse at night.  She denies any palpitations or flutters in her chest.  Denies recent allergy exacerbations as well. Patient does not smoke or drink.  She does not use illicit drugs.  PRESENT MEDICATIONS: 1. Azelastine nasal spray b.i.d. 2. Spironolactone 25 mg daily. 3. Omeprazole 40 mg daily. 4. Fish oil 1200 mg daily. 5. Multivitamins daily. 6. Singulair 10 mg daily. 7. Metoprolol 25 mg b.i.d. 8. Metformin 500 mg daily. 9. Synthroid 150 mcg daily. 10.Hydrocodone with APAP 5/325 mg one q.8h. p.r.n. 11.Neurontin 100 mg daily. 12.Ferrous sulfate 325 mg p.o. b.i.d. 13.Premarin 0.3 mg p.o. daily. 14.Os-Cal D one daily.15.Soma 350 mg t.i.d. p.r.n. muscle spasms.  PHYSICAL EXAMINATION:  GENERAL EXAMINATION:  She is a well-developed, well-nourished, mildly overweight black female, in no acute distress. VITAL SIGNS:  Height 5 feet 4 inches, weight 90.8 kg.  Blood pressure was 120/72, lying; sitting,  110/70; standing, 104/68.  Blood pressure today 104/71.  Temperature 98.1. HEENT:  Head:  Normocephalic, atraumatic.  Extraocular muscles are intact.  No scleral icterus.  Fundi grade I.  No sinus tenderness. Bilateral turbinate edema.  TMs are clear.  Throat:  Posterior pharynx is clear.  No enlarged thyroid.  No posterior cervical nodes. LUNGS:  Notable for distant breath sounds.  No wheezes.  No vocal fremitus. CARDIOVASCULAR EXAMINATION:  Normal S1, S2 without S3. ABDOMEN:  Notable for epigastric tenderness.  Active bowel sounds.  No masses appreciated. MUSCULOSKELETAL  EXAMINATION:  Full range of motion in all extremities. Negative Homans.  No edema. NEUROLOGICALLY:  Intact.  LABORATORY DATA:  Hemoglobin 8.8 as an outpatient.  IMPRESSION: 1. Iron-deficiency anemia previously documented with progression. 2. 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 3. Mild orthostasis. 4. Symptomatic anemia. 5. History of hypothyroidism, stable. 6. Diabetes mellitus. 7. Nonspecific fatigue secondary to above. 8. History of esophageal stricture, presently stable.  PLAN: 1. We will type and cross match patient for 2 units of packed RBCs. 2. We will guaiac her stools while in the hospital. 3. If her stools are positive, we will have GI see patient for     reevaluation of possible AVMs.  It should be noted that her     bleeding from AVMs have been sporadic. 4. Further followup as an outpatient otherwise.         ______________________________ Lind Guest. August Saucer, M.D.    ELD/MEDQ  D:  12/03/2010  T:  12/03/2010  Job:  [id number]  Electronically Signed by Willey Blade M.D. on 12/16/2010 10:53:41 PM

## 2010-12-19 NOTE — Discharge Summary (Signed)
  NAMEAARIEL, EMS            ACCOUNT NO.:  [id number]  MEDICAL RECORD NO.:  [id number]  LOCATION:  1313                         FACILITY:  [facility]  PHYSICIAN:  Rionna Feltes L. August Saucer, M.D.     DATE OF BIRTH:  28-Jan-1951  DATE OF ADMISSION:  12/03/2010 DATE OF DISCHARGE:  12/04/2010                              DISCHARGE SUMMARY   FINAL DIAGNOSES: 1. Symptomatic anemia. 2. Episodic gastrointestinal bleed secondary to arteriovenous     malformations. 3. Mild orthostasis, improved. 4. History of hypothyroidism, stable. 5. Diabetes mellitus, stable. 6. History of esophageal stricture.  OPERATIONS AND PROCEDURES:  Transfusion of 2 units of packed RBCs.  HISTORY OF PRESENT ILLNESS:  This is one of several [facility] admissions for this 60 year old divorced black female with history of hypertension, asthmatic bronchitis, hypothyroidism, and diabetes mellitus.  The patient has, however, been most recently had recurrent problems with iron deficiency anemia, associated with documented arteriovenous malformation with GI bleeds.  She has been monitored by Dr. Arlyce Dice of Gastroenterology.  She has also been known to have esophageal strictures, requiring periodic dilatations.  The patient was most recently seen in office complaining of increasing weakness.  She was noted to be much more fatigued over the past several weeks.  She had been having claudication as well.  She denied actual hematemesis, melena, hematochezia.  She had been taking her iron supplements. Despite this, however, hemoglobin was noted to be 8.8.  One month prior to this, it was 9.9.  The patient was subsequently admitted for therapeutic transfusion.  Past medical history and physical exam is per admission H and P.  HOSPITAL COURSE:  The patient was admitted for further treatment of progressive weakness.  At the time of admission, hemoglobin was 8.8.  IV was started and the patient was transfused 2 units of packed  RBCs which she tolerated well.  She did have 1 stool while there as well which was negative for occult blood.  She was maintained on Nexium as well as a Engineer, water.  The subsequent day, she did feel better.  Still had some fatigue, but not as orthostatic.  The patient was subsequently discharged home. Hemoglobin had risen to 10.8.  The patient was subsequently discharged home as noted.  MEDICATIONS:  At the time of discharge consisted of, 1. Ferrous gluconate 324 mg t.i.d. 2. Metoprolol 12.5 mg b.i.d. 3. Azelastine nasal spray 2 puffs b.i.d. 4. Fish oil 1200 mg daily. 5. Hydrocodone with APAP 5/325 mg q.8 h. p.r.n. 6. Multivitamins 1 daily. 7. Metformin 500 mg daily. 8. Neurontin 100 mg nightly. 9. Os-Cal plus D 600 mg daily. 10.Premarin 0.3 mg daily. 11.Soma 350 mg t.i.d. p.r.n. 12.Spironolactone 25 mg daily. 13.Singulair 10 mg daily. 14.Synthroid 150 mcg daily.  The patient will be maintained on a low-sodium diet.  She is to be seen in the office in 10 days' time for followup.          ______________________________ Lind Guest. August Saucer, M.D.     ELD/MEDQ  D:  12/18/2010  T:  12/19/2010  Job:  [id number]  Electronically Signed by Willey Blade M.D. on 12/19/2010 05:17:19 PM

## 2011-04-14 ENCOUNTER — Ambulatory Visit
Admission: RE | Admit: 2011-04-14 | Discharge: 2011-04-14 | Disposition: A | Discharge status: Home / Self Care | Payer: 59 | Source: Ambulatory Visit | Attending: Internal Medicine | Admitting: Internal Medicine

## 2011-04-14 ENCOUNTER — Other Ambulatory Visit: Payer: Self-pay | Admitting: Internal Medicine

## 2011-04-14 DIAGNOSIS — R52 Pain, unspecified: Secondary | ICD-10-CM

## 2011-09-02 ENCOUNTER — Ambulatory Visit: Payer: Self-pay | Admitting: Internal Medicine

## 2011-09-02 ENCOUNTER — Non-Acute Institutional Stay (HOSPITAL_COMMUNITY): Admission: AD | Admit: 2011-09-02 | Payer: 59 | Source: Home / Self Care | Admitting: Internal Medicine

## 2011-09-02 ENCOUNTER — Non-Acute Institutional Stay (HOSPITAL_COMMUNITY)
Admission: AD | Admit: 2011-09-02 | Discharge: 2011-09-02 | Disposition: A | Discharge status: Home / Self Care | Payer: 59 | Attending: Internal Medicine | Admitting: Internal Medicine

## 2011-09-02 DIAGNOSIS — D509 Iron deficiency anemia, unspecified: Secondary | ICD-10-CM | POA: Insufficient documentation

## 2011-09-02 DIAGNOSIS — K5521 Angiodysplasia of colon with hemorrhage: Secondary | ICD-10-CM | POA: Insufficient documentation

## 2011-09-02 DIAGNOSIS — J4489 Other specified chronic obstructive pulmonary disease: Secondary | ICD-10-CM | POA: Insufficient documentation

## 2011-09-02 DIAGNOSIS — D649 Anemia, unspecified: Secondary | ICD-10-CM | POA: Insufficient documentation

## 2011-09-02 DIAGNOSIS — R0989 Other specified symptoms and signs involving the circulatory and respiratory systems: Secondary | ICD-10-CM | POA: Insufficient documentation

## 2011-09-02 DIAGNOSIS — R0609 Other forms of dyspnea: Secondary | ICD-10-CM | POA: Insufficient documentation

## 2011-09-02 DIAGNOSIS — J449 Chronic obstructive pulmonary disease, unspecified: Secondary | ICD-10-CM | POA: Insufficient documentation

## 2011-09-02 DIAGNOSIS — E119 Type 2 diabetes mellitus without complications: Secondary | ICD-10-CM | POA: Insufficient documentation

## 2011-09-02 DIAGNOSIS — E039 Hypothyroidism, unspecified: Secondary | ICD-10-CM | POA: Insufficient documentation

## 2011-09-02 LAB — PREPARE RBC (CROSSMATCH)

## 2011-09-02 MED ORDER — DEXTROSE-NACL 5-0.45 % IV SOLN: INTRAVENOUS | Status: DC

## 2011-09-02 MED ORDER — DIPHENHYDRAMINE HCL 50 MG/ML IJ SOLN
25.0000 mg | Freq: Once | INTRAMUSCULAR | Status: AC | PRN
  Administered 2011-09-02: 12:00:00 via INTRAVENOUS
  Filled 2011-09-02: qty 1

## 2011-09-02 MED ORDER — FOLIC ACID 1 MG PO TABS: 1.0000 mg | ORAL_TABLET | Freq: Every day | ORAL | Status: DC

## 2011-09-02 MED ORDER — SODIUM CHLORIDE 0.9 % IV SOLN
500.0000 mL | Freq: Once | INTRAVENOUS | Status: AC
  Administered 2011-09-02: 500 mL via INTRAVENOUS

## 2011-09-02 MED ORDER — HYDROMORPHONE HCL PF 1 MG/ML IJ SOLN: 2.0000 mg | INTRAMUSCULAR | Status: DC | PRN

## 2011-09-02 MED ORDER — DIPHENHYDRAMINE HCL 25 MG PO CAPS: 25.0000 mg | ORAL_CAPSULE | ORAL | Status: DC | PRN

## 2011-09-02 MED ORDER — ONDANSETRON HCL 4 MG PO TABS: 4.0000 mg | ORAL_TABLET | ORAL | Status: DC | PRN

## 2011-09-02 MED ORDER — ONDANSETRON HCL 4 MG/2ML IJ SOLN: 4.0000 mg | INTRAMUSCULAR | Status: DC | PRN

## 2011-09-02 NOTE — Discharge Summary (Signed)
Agree with the above as planned.

## 2011-09-02 NOTE — Progress Notes (Deleted)
Clinic Note:  Lacey Vega is a 61 year old divorced black female with a history recurrent severe anemia. 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 Arlyce Dice. Most recently she has had progressive dyspnea on exertion with leg cramps the. She's been taking iron for replacement. Despite this her last hemoglobin has been 8.9. She's had 2 recent Hemoccult cards which were negative for occult blood. Lacey Vega was subsequently arranged to undergo transfusion of 2 units of packed RBCs for symptomatic anemia.  Her past medical history is well documented in her records.   No Known Allergies Current Facility-Administered Medications  Medication Dose Route Frequency Provider Last Rate Last Dose  . 0.9 %  sodium chloride infusion  500 mL Intravenous Once Gwenyth Bender, MD 20 mL/hr at 09/02/11 1142 500 mL at 09/02/11 1142  . diphenhydrAMINE (BENADRYL) injection 25 mg  25 mg Intravenous Once PRN Gwenyth Bender, MD      . DISCONTD: dextrose 5 %-0.45 % sodium chloride infusion   Intravenous Continuous Gwenyth Bender, MD      . DISCONTD: diphenhydrAMINE (BENADRYL) capsule 25-50 mg  25-50 mg Oral Q4H PRN Gwenyth Bender, MD      . DISCONTD: folic acid (FOLVITE) tablet 1 mg  1 mg Oral Daily Gwenyth Bender, MD      . DISCONTD: HYDROmorphone (DILAUDID) injection 2 mg  2 mg Intravenous Q2H PRN Gwenyth Bender, MD      . DISCONTD: ondansetron [facility]) injection 4 mg  4 mg Intravenous Q4H PRN Gwenyth Bender, MD      . DISCONTD: ondansetron (ZOFRAN) tablet 4 mg  4 mg Oral Q4H PRN Gwenyth Bender, MD        Objective: Blood pressure 115/64, pulse 68, temperature 97 F (36.1 C), resp. rate 16, SpO2 100.00%.  Well-developed well-nourished overweight black female in no acute distress. HEENT: No sclera icterus. No sinus tenderness. TMs are clear. NECK: No posterior cervical nodes. No enlarged thyroid. LUNGS: Clear to auscultation. No vocal fremitus. No CVA  tenderness. CV: Normal S1, S2 without S3. ABD: Minimal epigastric tenderness. No suprapubic tenderness. MSK: Negative Homans. No edema. NEURO: Nonfocal.  Lab results: Results for orders placed during the hospital encounter of 09/02/11 (from the past 48 hour(s))  TYPE AND SCREEN     Status: Normal (Preliminary result)   Collection Time   09/02/11  8:50 AM      Component Value Range Comment   ABO/RH(D) A POS      Antibody Screen POS      Sample Expiration 09/05/2011      Antibody Identification ANTI-E      PT AG Type NEGATIVE FOR E ANTIGEN      DAT, IgG NEG      Unit Number [id number]      Blood Component Type RED CELLS,LR      Unit division 00      Status of Unit ISSUED      Donor AG Type NEGATIVE FOR E ANTIGEN      Transfusion Status OK TO TRANSFUSE      Crossmatch Result COMPATIBLE      Unit Number [id number]      Blood Component Type RED CELLS,LR      Unit division 00      Status of Unit ISSUED      Donor AG Type NEGATIVE FOR E ANTIGEN  Transfusion Status OK TO TRANSFUSE      Crossmatch Result COMPATIBLE     PREPARE RBC (CROSSMATCH)     Status: Normal   Collection Time   09/02/11  8:50 AM      Component Value Range Comment   Order Confirmation ORDER PROCESSED BY BLOOD BANK     PREPARE RBC (CROSSMATCH)     Status: Normal   Collection Time   09/02/11 11:30 AM      Component Value Range Comment   Order Confirmation ORDER PROCESSED BY BLOOD BANK       Studies/Results: No results found.  Lacey Vega Active Problem List  Diagnoses  . DM  . ANEMIA  . Angiodysplasia of intestine with hemorrhage  . DYSPHAGIA UNSPECIFIED  . Asthma with COPD  . Hypothyroidism (acquired)    Impression: Recurrent symptomatic anemia. Iron deficiency anemia presently without occult blood. History of avascular malformations or bleeds. History of gastritis and esophageal stricture. Exertional dyspnea.   Plan: Transfusion of 2 units of packed RBCs today. Followup her CBC within the next 2  days. Continue by mouth iron as she is tolerating this at this time. She may return to work in 24 hours.   August Saucer, Mccade Sullenberger 09/02/2011 3:04 PM

## 2011-09-02 NOTE — Discharge Summary (Signed)
[facility] Discharge Summary   Patient ID: Lacey Vega MRN: [id number] DOB/AGE: [birth year] 62 y.o.  Admit date: 09/02/2011 Discharge date: 09/02/2011  Primary Care Physician:  Willey Blade, MD, MD  Admission Diagnoses:  Symptomatic Anemia   Discharge Diagnoses:   Symptomatic Anemia  Discharge Medications:  Medication List  As of 09/02/2011  6:56 PM   TAKE these medications         carisoprodol 350 MG tablet   Commonly known as: SOMA   Take 350 mg by mouth 2 (two) times daily as needed.      CENTRUM SILVER PO   Take by mouth daily.      CINNAMON PO   Take 100 mg by mouth daily. Plus Chromium      estrogens (conjugated) 0.3 MG tablet   Commonly known as: PREMARIN   Take 0.3 mg by mouth daily. Take daily for 21 days then do not take for 7 days.      ferrous sulfate 325 (65 FE) MG tablet   Take 325 mg by mouth 2 (two) times daily.      Fish Oil 1200 MG Caps   Take by mouth daily.      gabapentin 100 MG tablet   Commonly known as: NEURONTIN   Take 100 mg by mouth at bedtime.      HYDROcodone-acetaminophen 5-325 MG per tablet   Commonly known as: NORCO   Take 1 tablet by mouth every 4 (four) hours as needed.      levothyroxine 150 MCG tablet   Commonly known as: SYNTHROID, LEVOTHROID   Take 150 mcg by mouth daily.      METFORMIN HCL PO   Take 500 mg by mouth daily.      metoprolol tartrate 25 MG tablet   Commonly known as: LOPRESSOR   Take 25 mg by mouth 2 (two) times daily. 1/2 TAB      montelukast 10 MG tablet   Commonly known as: SINGULAIR   Take 10 mg by mouth daily.      omeprazole 40 MG capsule   Commonly known as: PRILOSEC   Take 40 mg by mouth daily.      Os-Cal Ultra 600 MG Tabs   Take 1 tablet by mouth daily.      spironolactone 25 MG tablet   Commonly known as: ALDACTONE   Take 25 mg by mouth daily.             Consults:  None  Significant Diagnostic Studies:  No results found.   [facility]  Course:  For complete details please refer to admission H and P, but in brief, Lacey Vega is a pleasant 61 year old African-American female with a history of recurrent, severe anemia.  The patient has been know to have bleeding from AVMs in the past. She has recently experience dyspnea on exertion and leg cramps.  She has been taking iron supplementation, however her Hgb has been 8.9.  The patient received a transfusion of 2 units of PRBCs today, states that she feels better and that she is ready to go home.  The patient will be discharged home in stable condition at this time.  The patient is accompanied by a young lady that will drive the patient home.  Physical Exam at Discharge:  BP 108/50  Pulse 75  Temp 98.6 F (37 C)  Resp 17  SpO2 100%  Gen: Alert and oriented, well nourished, well developed, no apparent distress Cardiovascular:  S1, S2 regular, distant heart sounds,  no murmur/click/rub or gallop Respiratory: Diminished RLL, CTA bilaterally Gastrointestinal: Soft, distendid, hypoactive bowel sound, no tenderness, no organomegaly Extremities: Atraumatic, no cyanosis, no focal deficits   Disposition at Discharge: 01-Home or Self Care  Discharge Orders: Discharge Orders    Future Orders Please Complete By Expires   Diet - low sodium heart healthy      Increase activity slowly      Discharge instructions      Comments:   Take all medications as prescribed Please notify Physician immediately if you start to experience shortness of breath, dizziness, severe headache, severe itching, a rash or any unusual symptoms within the next 24 - 48 hours. Please get plenty of rest and drink an adequate amount of fluids   Call MD for:  temperature >100.4      Call MD for:  persistant nausea and vomiting      Call MD for:  severe uncontrolled pain      Call MD for:  difficulty breathing, headache or visual disturbances      Call MD for:  hives      Call MD for:  persistant  dizziness or light-headedness      Call MD for:  extreme fatigue         Condition at Discharge:   Stable  Time spent on Discharge:  Greater than 30 minutes.  SignedLarina Bras 09/02/2011, 6:56 PM

## 2011-09-03 LAB — TYPE AND SCREEN
Donor AG Type: NEGATIVE
Donor AG Type: NEGATIVE

## 2011-11-10 ENCOUNTER — Ambulatory Visit: Payer: 59 | Attending: Sports Medicine

## 2011-11-10 DIAGNOSIS — M25619 Stiffness of unspecified shoulder, not elsewhere classified: Secondary | ICD-10-CM | POA: Insufficient documentation

## 2011-11-10 DIAGNOSIS — IMO0001 Reserved for inherently not codable concepts without codable children: Secondary | ICD-10-CM | POA: Insufficient documentation

## 2011-11-10 DIAGNOSIS — M25519 Pain in unspecified shoulder: Secondary | ICD-10-CM | POA: Insufficient documentation

## 2011-11-11 ENCOUNTER — Other Ambulatory Visit: Payer: Self-pay | Admitting: Gastroenterology

## 2011-11-11 DIAGNOSIS — R109 Unspecified abdominal pain: Secondary | ICD-10-CM

## 2011-11-11 DIAGNOSIS — R1310 Dysphagia, unspecified: Secondary | ICD-10-CM

## 2011-11-11 NOTE — H&P (Signed)
HISTORY AND PHYSICAL:  Patient is a 61 year old divorced black female with a history recurrent severe anemia. 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 Arlyce Dice. Most recently she has had progressive dyspnea on exertion with leg cramps the. She's been taking iron for replacement. Despite this her last hemoglobin has been 8.9. She's had 2 recent Hemoccult cards which were negative for occult blood. Patient was subsequently arranged to undergo transfusion of 2 units of packed RBCs for symptomatic anemia.  Her past medical history is well documented in her records.   No Known Allergies No current facility-administered medications for this encounter.   Current Outpatient Prescriptions  Medication Sig Dispense Refill  . Calcium Carb-Vit D-C-E-Mineral (OS-CAL ULTRA) 600 MG TABS Take 1 tablet by mouth daily.        . carisoprodol (SOMA) 350 MG tablet Take 350 mg by mouth 2 (two) times daily as needed.        Marland Kitchen CINNAMON PO Take 100 mg by mouth daily. Plus Chromium       . estrogens, conjugated, (PREMARIN) 0.3 MG tablet Take 0.3 mg by mouth daily. Take daily for 21 days then do not take for 7 days.       . ferrous sulfate 325 (65 FE) MG tablet Take 325 mg by mouth 2 (two) times daily.        Marland Kitchen gabapentin (NEURONTIN) 100 MG tablet Take 100 mg by mouth at bedtime.        Marland Kitchen HYDROcodone-acetaminophen (NORCO) 5-325 MG per tablet Take 1 tablet by mouth every 4 (four) hours as needed.        Marland Kitchen levothyroxine (SYNTHROID, LEVOTHROID) 150 MCG tablet Take 150 mcg by mouth daily.        Marland Kitchen METFORMIN HCL PO Take 500 mg by mouth daily.        . metoprolol tartrate (LOPRESSOR) 25 MG tablet Take 25 mg by mouth 2 (two) times daily. 1/2 TAB       . montelukast (SINGULAIR) 10 MG tablet Take 10 mg by mouth daily.        . Multiple Vitamins-Minerals (CENTRUM SILVER PO) Take by mouth daily.        . Omega-3 Fatty Acids (FISH OIL) 1200  MG CAPS Take by mouth daily.        Marland Kitchen omeprazole (PRILOSEC) 40 MG capsule Take 40 mg by mouth daily.        Marland Kitchen spironolactone (ALDACTONE) 25 MG tablet Take 25 mg by mouth daily.          Objective: Blood pressure 108/50, pulse 75, temperature 98.6 F (37 C), resp. rate 17, SpO2 100.00%.  Well-developed well-nourished overweight black female in no acute distress. HEENT: No sclera icterus. No sinus tenderness. TMs are clear. NECK: No posterior cervical nodes. No enlarged thyroid. LUNGS: Clear to auscultation. No vocal fremitus. No CVA tenderness. CV: Normal S1, S2 without S3. ABD: Minimal epigastric tenderness. No suprapubic tenderness. MSK: Negative Homans. No edema. NEURO: Nonfocal.  Lab results: No results found for this or any previous visit (from the past 48 hour(s)).  Studies/Results: No results found.  Patient Active Problem List  Diagnoses  . DM  . ANEMIA  . Angiodysplasia of intestine with hemorrhage  . DYSPHAGIA UNSPECIFIED  . Asthma with COPD  . Hypothyroidism (acquired)    Impression: Recurrent symptomatic anemia. Iron deficiency anemia presently without occult blood. History of avascular  malformations or bleeds. History of gastritis and esophageal stricture. Exertional dyspnea.   Plan: Transfusion of 2 units of packed RBCs today. Followup her CBC within the next 2 days. Continue by mouth iron as she is tolerating this at this time. She may return to work in 24 hours.   August Saucer, Betzaida Cremeens 09/02/2011 3:04 PM

## 2011-11-13 ENCOUNTER — Ambulatory Visit
Admission: RE | Admit: 2011-11-13 | Discharge: 2011-11-13 | Disposition: A | Discharge status: Home / Self Care | Payer: 59 | Source: Ambulatory Visit | Attending: Gastroenterology | Admitting: Gastroenterology

## 2011-11-13 ENCOUNTER — Ambulatory Visit: Payer: 59 | Admitting: Physical Therapy

## 2011-11-13 ENCOUNTER — Other Ambulatory Visit: Payer: 59

## 2011-11-13 DIAGNOSIS — R109 Unspecified abdominal pain: Secondary | ICD-10-CM

## 2011-11-13 DIAGNOSIS — R1310 Dysphagia, unspecified: Secondary | ICD-10-CM

## 2011-11-18 ENCOUNTER — Ambulatory Visit: Payer: 59

## 2011-11-20 ENCOUNTER — Ambulatory Visit: Payer: 59

## 2011-11-25 ENCOUNTER — Ambulatory Visit: Payer: 59

## 2011-11-27 ENCOUNTER — Ambulatory Visit: Payer: 59

## 2011-12-03 ENCOUNTER — Encounter (HOSPITAL_COMMUNITY): Payer: Self-pay

## 2011-12-03 ENCOUNTER — Ambulatory Visit (HOSPITAL_COMMUNITY)
Admission: RE | Admit: 2011-12-03 | Discharge: 2011-12-03 | Disposition: A | Discharge status: Home / Self Care | Payer: 59 | Source: Ambulatory Visit | Attending: Gastroenterology | Admitting: Gastroenterology

## 2011-12-03 ENCOUNTER — Encounter (HOSPITAL_COMMUNITY)
Admission: RE | Disposition: A | Discharge status: Home / Self Care | Payer: Self-pay | Source: Ambulatory Visit | Attending: Gastroenterology

## 2011-12-03 DIAGNOSIS — D126 Benign neoplasm of colon, unspecified: Secondary | ICD-10-CM | POA: Insufficient documentation

## 2011-12-03 DIAGNOSIS — K31819 Angiodysplasia of stomach and duodenum without bleeding: Secondary | ICD-10-CM | POA: Insufficient documentation

## 2011-12-03 DIAGNOSIS — K449 Diaphragmatic hernia without obstruction or gangrene: Secondary | ICD-10-CM | POA: Insufficient documentation

## 2011-12-03 DIAGNOSIS — K5730 Diverticulosis of large intestine without perforation or abscess without bleeding: Secondary | ICD-10-CM | POA: Insufficient documentation

## 2011-12-03 DIAGNOSIS — D509 Iron deficiency anemia, unspecified: Secondary | ICD-10-CM | POA: Insufficient documentation

## 2011-12-03 DIAGNOSIS — R1310 Dysphagia, unspecified: Secondary | ICD-10-CM | POA: Insufficient documentation

## 2011-12-03 HISTORY — PX: HOT HEMOSTASIS: SHX5433

## 2011-12-03 HISTORY — PX: COLONOSCOPY: SHX5424

## 2011-12-03 HISTORY — PX: ESOPHAGOGASTRODUODENOSCOPY: SHX5428

## 2011-12-03 SURGERY — COLONOSCOPY
Anesthesia: Moderate Sedation

## 2011-12-03 MED ORDER — SODIUM CHLORIDE 0.9 % IJ SOLN
100.0000 [IU] | Freq: Once | INTRAMUSCULAR | Status: AC
  Administered 2011-12-03: 100 [IU] via SUBMUCOSAL
  Filled 2011-12-03: qty 100

## 2011-12-03 MED ORDER — FENTANYL CITRATE 0.05 MG/ML IJ SOLN
INTRAMUSCULAR | Status: AC
  Filled 2011-12-03: qty 2

## 2011-12-03 MED ORDER — SODIUM CHLORIDE 0.9 % IV SOLN
Freq: Once | INTRAVENOUS | Status: AC
  Administered 2011-12-03: 500 mL via INTRAVENOUS

## 2011-12-03 MED ORDER — MIDAZOLAM HCL 10 MG/2ML IJ SOLN
INTRAMUSCULAR | Status: AC
  Filled 2011-12-03: qty 4

## 2011-12-03 MED ORDER — FENTANYL CITRATE 0.05 MG/ML IJ SOLN
INTRAMUSCULAR | Status: DC | PRN
  Administered 2011-12-03 (×4): 25 ug via INTRAVENOUS

## 2011-12-03 MED ORDER — MIDAZOLAM HCL 10 MG/2ML IJ SOLN
INTRAMUSCULAR | Status: DC | PRN
  Administered 2011-12-03 (×6): 2 mg via INTRAVENOUS

## 2011-12-03 NOTE — Interval H&P Note (Signed)
History and Physical Interval Note:  12/03/2011 10:21 AM  Lacey Vega  has presented today for surgery, with the diagnosis of abd pain  The various methods of treatment have been discussed with the patient and family. After consideration of risks, benefits and other options for treatment, the patient has consented to  Procedure(s) (LRB): COLONOSCOPY (N/A) ESOPHAGOGASTRODUODENOSCOPY (EGD) (N/A) HOT HEMOSTASIS (ARGON PLASMA COAGULATION/BICAP) (N/A) as a surgical intervention .  The patient's history has been reviewed, patient examined, no change in status, stable for surgery.  I have reviewed the patients' chart and labs.  Questions were answered to the patient's satisfaction.     Gemini Beaumier C.

## 2011-12-03 NOTE — Op Note (Signed)
[facility] [address] [place], [place]  [address]  ENDOSCOPY PROCEDURE REPORT  PATIENT:  Lacey Vega, Lacey Vega  MR#:  [id number] BIRTHDATE:  Apr 08, 1951, 60 yrs. old  GENDER:  female  ENDOSCOPIST:  Charlott Rakes, MD Referred by:  Willey Blade, M.D.  PROCEDURE DATE:  12/03/2011 PROCEDURE:  EGD with lesion ablation, EGD w/botox injection ASA CLASS:  Class III INDICATIONS:  iron deficiency anemia, dysphagia  MEDICATIONS:   Fentanyl 25 mcg IV, Versed 6 mg IV, There was residual sedation effect present from prior procedure.  TOPICAL ANESTHETIC:  DESCRIPTION OF PROCEDURE:   After the risks benefits and alternatives of the procedure were thoroughly explained, informed consent was obtained.  The Pentax Gastroscope [id number] endoscope was introduced through the mouth and advanced to the second portion of the duodenum, without limitations.  The instrument was slowly withdrawn as the mucosa was fully examined. <<PROCEDUREIMAGES>>  FINDINGS:  The endoscope was inserted into the oropharynx and esophagus was intubated. A medium-sized diverticulum was seen in the distal esophagus. The gastroesophageal junction was noted to be 38 cm from the incisors and no stenosis or stricture was seen. Endoscope easily advanced through the GE junction into the stomach. There were several small AVMs noted on the lesser curvature of the stomach there were nonbleeding. Retroflexion was done revealing small hiatal hernia.  The endoscope was advanced to the duodenal bulb and second portion of duodenum where several nonbleeding small AVMs were noted in the duodenal bulb and second portion of the duodenum.  Argon plasma coagulation was used to fulgurate these AVMs in the duodenum and stomach with success The endoscope was withdrawn back into the stomach and upon withdrawal back into the esophagus Botox was used to inject submucosally immediately proximal to the Z line. The Z line was regular  in appearance and noted to be 38 cm from the incisors. 25 units of Botox  (1 cc) was injected submucosally into each quadrant with small amount of bleeding seen that resolved spontaneously.  COMPLICATIONS:  None  ENDOSCOPIC IMPRESSION:  1. 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 Small hiatal hernia 4. Distal esophageal diverticulum  RECOMMENDATIONS:    1. Follow H/Hs 2. Avoid NSAIDs 3. F/U in office in 2 months  REPEAT EXAM:  N/A  ______________________________ Charlott Rakes, MD  CC:  Willey Blade, MD  n. Rosalie DoctorCharlott Rakes at 12/03/2011 11:48 AM  Page 2 of 3   Jennette Kettle [place], [id number]

## 2011-12-03 NOTE — H&P (Signed)
  Date of Initial H&P: 11/27/11  History reviewed, patient examined, no change in status, stable for surgery. EGD/Colonoscopy due to dysphagia and anemia.

## 2011-12-03 NOTE — Op Note (Signed)
[facility] [address] [place], [place]  [address]  COLONOSCOPY PROCEDURE REPORT  PATIENT:  Lacey Vega, Lacey Vega  MR#:  [id number] BIRTHDATE:  [birth year], 60 yrs. old  GENDER:  female ENDOSCOPIST:  Charlott Rakes, MD REF. BY:  Willey Blade, M.D. PROCEDURE DATE:  12/03/2011 PROCEDURE:  Colonoscopy with snare polypectomy ASA CLASS:  Class III INDICATIONS:  Iron deficiency anemia, history of pre-cancerous (adenomatous) colon polyps MEDICATIONS:   Fentanyl 75 mcg IV, Versed 6 mg IV  DESCRIPTION OF PROCEDURE:   After the risks benefits and alternatives of the procedure were thoroughly explained, informed consent was obtained.  The Pentax Ped Colon [id number] endoscope was introduced through the anus and advanced to the cecum, which was identified by both the appendix and ileocecal valve, limited by looping difficulties.    The quality of the prep was good..  The instrument was then slowly withdrawn as the colon was fully examined. <<PROCEDUREIMAGES>>  FINDINGS:  Rectal exam unremarkable.  Pediatric colonoscope inserted into the colon and advanced to the cecum, where the appendiceal orifice and ileocecal valve were identified.   In order to reach the cecum repeated loop reduction was necessary due to excessive looping and a torturous colon. On careful withdrawal of the colonoscope a 5 mm sessile polyp was seen in descending colon that was removed with snare cautery. A few small sigmoid diverticula were also seen on withdrawal.    Retroflexion was unremarkable.  COMPLICATIONS:  None  IMPRESSION:     1. Colon polyp status post snare cautery 2. Few sigmoid diverticula  RECOMMENDATIONS:  1. F/U on path 2. High fiber diet 3. No NSAIDs for 2 weeks  ______________________________ Charlott Rakes, MD  CC:  Willey Blade, MD  n. Rosalie DoctorCharlott Rakes at 12/03/2011 11:33 AM  Kendell Bane, [id number]

## 2012-03-02 ENCOUNTER — Ambulatory Visit (HOSPITAL_COMMUNITY)
Admission: RE | Admit: 2012-03-02 | Discharge: 2012-03-02 | Disposition: A | Discharge status: Home / Self Care | Payer: 59 | Source: Ambulatory Visit | Attending: Sports Medicine | Admitting: Sports Medicine

## 2012-03-02 ENCOUNTER — Other Ambulatory Visit (HOSPITAL_COMMUNITY): Payer: Self-pay | Admitting: Sports Medicine

## 2012-03-02 DIAGNOSIS — M25511 Pain in right shoulder: Secondary | ICD-10-CM

## 2012-03-02 DIAGNOSIS — S46819A Strain of other muscles, fascia and tendons at shoulder and upper arm level, unspecified arm, initial encounter: Secondary | ICD-10-CM | POA: Insufficient documentation

## 2012-03-02 DIAGNOSIS — S46919A Strain of unspecified muscle, fascia and tendon at shoulder and upper arm level, unspecified arm, initial encounter: Secondary | ICD-10-CM | POA: Insufficient documentation

## 2012-03-02 DIAGNOSIS — X58XXXA Exposure to other specified factors, initial encounter: Secondary | ICD-10-CM | POA: Insufficient documentation

## 2012-03-12 ENCOUNTER — Encounter (HOSPITAL_BASED_OUTPATIENT_CLINIC_OR_DEPARTMENT_OTHER): Payer: Self-pay | Admitting: *Deleted

## 2012-03-12 NOTE — Progress Notes (Signed)
To come in for ekg-bmet 

## 2012-03-16 ENCOUNTER — Encounter (HOSPITAL_BASED_OUTPATIENT_CLINIC_OR_DEPARTMENT_OTHER)
Admission: RE | Admit: 2012-03-16 | Discharge: 2012-03-16 | Disposition: A | Discharge status: Home / Self Care | Payer: 59 | Source: Ambulatory Visit | Attending: Orthopedic Surgery | Admitting: Orthopedic Surgery

## 2012-03-16 DIAGNOSIS — Z538 Procedure and treatment not carried out for other reasons: Secondary | ICD-10-CM | POA: Insufficient documentation

## 2012-03-16 DIAGNOSIS — Z01812 Encounter for preprocedural laboratory examination: Secondary | ICD-10-CM | POA: Insufficient documentation

## 2012-03-16 LAB — BASIC METABOLIC PANEL
CO2: 27 mEq/L (ref 19–32)
GFR calc non Af Amer: 88 mL/min — ABNORMAL LOW (ref >90)
Glucose, Bld: 136 mg/dL — ABNORMAL HIGH (ref 70–99)
Potassium: 3.8 mEq/L (ref 3.5–5.1)
Sodium: 139 mEq/L (ref 135–145)

## 2012-03-16 NOTE — Progress Notes (Signed)
Changes in ekg from last.  Reviewed by Dr Ivin Booty. He wants cardiology clearance. Dr August Saucer notified will follow up pt for cardiology workup. Spoke with Dr Greig Right scheduler she will cancel case. Case cancelled here. Called pt and notified her of what has occurred and she voiced understanding. She will call Dr Greig Right office to reschedule.

## 2012-03-18 ENCOUNTER — Encounter (HOSPITAL_BASED_OUTPATIENT_CLINIC_OR_DEPARTMENT_OTHER): Admission: RE | Payer: Self-pay | Source: Ambulatory Visit

## 2012-03-18 ENCOUNTER — Ambulatory Visit (HOSPITAL_BASED_OUTPATIENT_CLINIC_OR_DEPARTMENT_OTHER): Admission: RE | Admit: 2012-03-18 | Payer: 59 | Source: Ambulatory Visit | Admitting: Orthopedic Surgery

## 2012-03-18 HISTORY — DX: Sickle-cell disease without crisis: D57.1

## 2012-03-18 SURGERY — ARTHROSCOPY, SHOULDER, WITH ROTATOR CUFF REPAIR
Anesthesia: General | Laterality: Right

## 2012-03-24 ENCOUNTER — Other Ambulatory Visit (HOSPITAL_COMMUNITY): Payer: Self-pay | Admitting: Cardiology

## 2012-03-24 DIAGNOSIS — R079 Chest pain, unspecified: Secondary | ICD-10-CM

## 2012-03-31 ENCOUNTER — Other Ambulatory Visit: Payer: Self-pay

## 2012-03-31 ENCOUNTER — Encounter (HOSPITAL_COMMUNITY)
Admission: RE | Admit: 2012-03-31 | Discharge: 2012-03-31 | Disposition: A | Discharge status: Home / Self Care | Payer: 59 | Source: Ambulatory Visit | Attending: Cardiology | Admitting: Cardiology

## 2012-03-31 DIAGNOSIS — R079 Chest pain, unspecified: Secondary | ICD-10-CM | POA: Insufficient documentation

## 2012-03-31 MED ORDER — REGADENOSON 0.4 MG/5ML IV SOLN
INTRAVENOUS | Status: AC
  Filled 2012-03-31: qty 5

## 2012-03-31 MED ORDER — TECHNETIUM TC 99M SESTAMIBI GENERIC - CARDIOLITE
30.0000 | Freq: Once | INTRAVENOUS | Status: AC | PRN
  Administered 2012-03-31: 30 via INTRAVENOUS

## 2012-03-31 MED ORDER — TECHNETIUM TC 99M SESTAMIBI GENERIC - CARDIOLITE
10.0000 | Freq: Once | INTRAVENOUS | Status: AC | PRN
  Administered 2012-03-31: 10 via INTRAVENOUS

## 2012-03-31 MED ORDER — REGADENOSON 0.4 MG/5ML IV SOLN
0.4000 mg | Freq: Once | INTRAVENOUS | Status: AC
  Administered 2012-03-31: 0.4 mg via INTRAVENOUS

## 2012-04-19 ENCOUNTER — Encounter: Payer: Self-pay | Admitting: Vascular Surgery

## 2012-05-03 ENCOUNTER — Ambulatory Visit: Payer: 59 | Attending: Orthopedic Surgery | Admitting: Physical Therapy

## 2012-05-03 DIAGNOSIS — M25519 Pain in unspecified shoulder: Secondary | ICD-10-CM | POA: Insufficient documentation

## 2012-05-03 DIAGNOSIS — IMO0001 Reserved for inherently not codable concepts without codable children: Secondary | ICD-10-CM | POA: Insufficient documentation

## 2012-05-03 DIAGNOSIS — R293 Abnormal posture: Secondary | ICD-10-CM | POA: Insufficient documentation

## 2012-05-03 DIAGNOSIS — M25619 Stiffness of unspecified shoulder, not elsewhere classified: Secondary | ICD-10-CM | POA: Insufficient documentation

## 2012-05-05 ENCOUNTER — Ambulatory Visit: Payer: 59 | Admitting: Physical Therapy

## 2012-05-07 ENCOUNTER — Ambulatory Visit: Payer: 59 | Admitting: Rehabilitation

## 2012-05-10 ENCOUNTER — Ambulatory Visit: Payer: 59 | Admitting: Physical Therapy

## 2012-05-12 ENCOUNTER — Ambulatory Visit: Payer: 59 | Admitting: Physical Therapy

## 2012-05-14 ENCOUNTER — Ambulatory Visit: Payer: 59 | Admitting: Physical Therapy

## 2012-05-17 ENCOUNTER — Ambulatory Visit: Payer: 59 | Admitting: Physical Therapy

## 2012-05-19 ENCOUNTER — Ambulatory Visit: Payer: 59 | Admitting: Rehabilitation

## 2012-05-21 ENCOUNTER — Ambulatory Visit: Payer: 59 | Admitting: Physical Therapy

## 2012-05-24 ENCOUNTER — Ambulatory Visit: Payer: 59 | Admitting: Rehabilitation

## 2012-05-25 ENCOUNTER — Ambulatory Visit: Payer: 59 | Admitting: Rehabilitation

## 2012-06-01 ENCOUNTER — Ambulatory Visit: Payer: 59 | Admitting: Rehabilitation

## 2012-06-02 HISTORY — PX: ROTATOR CUFF REPAIR: SHX139

## 2012-06-03 ENCOUNTER — Ambulatory Visit: Payer: 59 | Attending: Orthopedic Surgery | Admitting: Rehabilitation

## 2012-06-03 DIAGNOSIS — R293 Abnormal posture: Secondary | ICD-10-CM | POA: Insufficient documentation

## 2012-06-03 DIAGNOSIS — M25619 Stiffness of unspecified shoulder, not elsewhere classified: Secondary | ICD-10-CM | POA: Insufficient documentation

## 2012-06-03 DIAGNOSIS — IMO0001 Reserved for inherently not codable concepts without codable children: Secondary | ICD-10-CM | POA: Insufficient documentation

## 2012-06-03 DIAGNOSIS — M25519 Pain in unspecified shoulder: Secondary | ICD-10-CM | POA: Insufficient documentation

## 2012-06-07 ENCOUNTER — Ambulatory Visit: Payer: 59 | Admitting: Physical Therapy

## 2012-06-08 ENCOUNTER — Ambulatory Visit: Payer: 59 | Admitting: Physical Therapy

## 2012-06-09 ENCOUNTER — Ambulatory Visit: Payer: 59 | Admitting: Physical Therapy

## 2012-06-14 ENCOUNTER — Ambulatory Visit: Payer: 59 | Admitting: Physical Therapy

## 2012-06-15 ENCOUNTER — Ambulatory Visit: Payer: 59 | Admitting: Rehabilitation

## 2012-06-17 ENCOUNTER — Encounter: Payer: Self-pay | Admitting: *Deleted

## 2012-06-17 ENCOUNTER — Ambulatory Visit: Payer: 59 | Admitting: Physical Therapy

## 2012-06-17 DIAGNOSIS — E119 Type 2 diabetes mellitus without complications: Secondary | ICD-10-CM | POA: Insufficient documentation

## 2012-06-17 DIAGNOSIS — Z87891 Personal history of nicotine dependence: Secondary | ICD-10-CM | POA: Insufficient documentation

## 2012-06-17 DIAGNOSIS — D509 Iron deficiency anemia, unspecified: Secondary | ICD-10-CM | POA: Insufficient documentation

## 2012-06-17 DIAGNOSIS — J45909 Unspecified asthma, uncomplicated: Secondary | ICD-10-CM | POA: Insufficient documentation

## 2012-06-17 DIAGNOSIS — Z8774 Personal history of (corrected) congenital malformations of heart and circulatory system: Secondary | ICD-10-CM | POA: Insufficient documentation

## 2012-06-17 DIAGNOSIS — M47812 Spondylosis without myelopathy or radiculopathy, cervical region: Secondary | ICD-10-CM | POA: Insufficient documentation

## 2012-06-17 DIAGNOSIS — Z87898 Personal history of other specified conditions: Secondary | ICD-10-CM | POA: Insufficient documentation

## 2012-06-17 DIAGNOSIS — R1310 Dysphagia, unspecified: Secondary | ICD-10-CM | POA: Insufficient documentation

## 2012-06-21 ENCOUNTER — Ambulatory Visit: Payer: 59 | Admitting: Rehabilitation

## 2012-06-23 ENCOUNTER — Ambulatory Visit: Payer: 59 | Admitting: Rehabilitation

## 2012-06-25 ENCOUNTER — Encounter: Payer: 59 | Admitting: Rehabilitation

## 2012-06-25 ENCOUNTER — Ambulatory Visit: Payer: 59 | Admitting: Rehabilitation

## 2012-06-28 ENCOUNTER — Ambulatory Visit: Payer: 59 | Admitting: Rehabilitation

## 2012-06-30 ENCOUNTER — Ambulatory Visit: Payer: 59 | Admitting: Rehabilitation

## 2012-07-02 ENCOUNTER — Ambulatory Visit: Payer: 59 | Admitting: Rehabilitation

## 2012-07-05 ENCOUNTER — Ambulatory Visit: Payer: 59 | Attending: Orthopedic Surgery | Admitting: Rehabilitation

## 2012-07-05 DIAGNOSIS — IMO0001 Reserved for inherently not codable concepts without codable children: Secondary | ICD-10-CM | POA: Insufficient documentation

## 2012-07-05 DIAGNOSIS — R293 Abnormal posture: Secondary | ICD-10-CM | POA: Insufficient documentation

## 2012-07-05 DIAGNOSIS — M25519 Pain in unspecified shoulder: Secondary | ICD-10-CM | POA: Insufficient documentation

## 2012-07-05 DIAGNOSIS — M25619 Stiffness of unspecified shoulder, not elsewhere classified: Secondary | ICD-10-CM | POA: Insufficient documentation

## 2012-07-07 ENCOUNTER — Ambulatory Visit: Payer: 59 | Admitting: Rehabilitation

## 2012-07-12 ENCOUNTER — Ambulatory Visit: Payer: 59 | Admitting: Physical Therapy

## 2012-07-14 ENCOUNTER — Encounter: Payer: 59 | Admitting: Rehabilitation

## 2012-07-16 ENCOUNTER — Ambulatory Visit (HOSPITAL_COMMUNITY)
Admission: RE | Admit: 2012-07-16 | Discharge: 2012-07-16 | Disposition: A | Discharge status: Home / Self Care | Payer: 59 | Source: Ambulatory Visit | Attending: Orthopedic Surgery | Admitting: Orthopedic Surgery

## 2012-07-16 ENCOUNTER — Other Ambulatory Visit (HOSPITAL_COMMUNITY): Payer: Self-pay | Admitting: Orthopedic Surgery

## 2012-07-16 DIAGNOSIS — M25529 Pain in unspecified elbow: Secondary | ICD-10-CM

## 2012-07-16 DIAGNOSIS — M79609 Pain in unspecified limb: Secondary | ICD-10-CM | POA: Insufficient documentation

## 2012-07-16 DIAGNOSIS — M7989 Other specified soft tissue disorders: Secondary | ICD-10-CM

## 2012-07-16 NOTE — Progress Notes (Signed)
*  Preliminary Results* Right upper extremity venous duplex completed. Right upper extremity is negative for deep and superficial vein thrombosis.  Attempted to call preliminary results in to Zonia Kief, PA with no response. Patient is released and she can be called if there are any further instructions for her.  07/16/2012 1:32 PM Gertie Fey, RDMS, RDCS

## 2012-09-02 ENCOUNTER — Telehealth (HOSPITAL_COMMUNITY): Payer: Self-pay

## 2013-05-16 ENCOUNTER — Emergency Department (HOSPITAL_COMMUNITY): Payer: 59

## 2013-05-16 ENCOUNTER — Emergency Department (HOSPITAL_COMMUNITY)
Admission: EM | Admit: 2013-05-16 | Discharge: 2013-05-16 | Disposition: A | Discharge status: Home / Self Care | Payer: 59 | Attending: Emergency Medicine | Admitting: Emergency Medicine

## 2013-05-16 ENCOUNTER — Encounter (HOSPITAL_COMMUNITY): Payer: Self-pay | Admitting: Emergency Medicine

## 2013-05-16 DIAGNOSIS — I10 Essential (primary) hypertension: Secondary | ICD-10-CM | POA: Insufficient documentation

## 2013-05-16 DIAGNOSIS — Z87891 Personal history of nicotine dependence: Secondary | ICD-10-CM | POA: Insufficient documentation

## 2013-05-16 DIAGNOSIS — D571 Sickle-cell disease without crisis: Secondary | ICD-10-CM | POA: Insufficient documentation

## 2013-05-16 DIAGNOSIS — E119 Type 2 diabetes mellitus without complications: Secondary | ICD-10-CM | POA: Insufficient documentation

## 2013-05-16 DIAGNOSIS — Z860100 Personal history of colon polyps, unspecified: Secondary | ICD-10-CM | POA: Insufficient documentation

## 2013-05-16 DIAGNOSIS — Z9089 Acquired absence of other organs: Secondary | ICD-10-CM | POA: Insufficient documentation

## 2013-05-16 DIAGNOSIS — M7730 Calcaneal spur, unspecified foot: Secondary | ICD-10-CM | POA: Insufficient documentation

## 2013-05-16 DIAGNOSIS — Y939 Activity, unspecified: Secondary | ICD-10-CM | POA: Insufficient documentation

## 2013-05-16 DIAGNOSIS — E039 Hypothyroidism, unspecified: Secondary | ICD-10-CM | POA: Insufficient documentation

## 2013-05-16 DIAGNOSIS — Z79899 Other long term (current) drug therapy: Secondary | ICD-10-CM | POA: Insufficient documentation

## 2013-05-16 DIAGNOSIS — K219 Gastro-esophageal reflux disease without esophagitis: Secondary | ICD-10-CM | POA: Insufficient documentation

## 2013-05-16 DIAGNOSIS — M7732 Calcaneal spur, left foot: Secondary | ICD-10-CM

## 2013-05-16 DIAGNOSIS — F411 Generalized anxiety disorder: Secondary | ICD-10-CM | POA: Insufficient documentation

## 2013-05-16 DIAGNOSIS — J45909 Unspecified asthma, uncomplicated: Secondary | ICD-10-CM | POA: Insufficient documentation

## 2013-05-16 DIAGNOSIS — Y929 Unspecified place or not applicable: Secondary | ICD-10-CM | POA: Insufficient documentation

## 2013-05-16 DIAGNOSIS — R296 Repeated falls: Secondary | ICD-10-CM | POA: Insufficient documentation

## 2013-05-16 DIAGNOSIS — S8990XA Unspecified injury of unspecified lower leg, initial encounter: Secondary | ICD-10-CM | POA: Insufficient documentation

## 2013-05-16 MED ORDER — IBUPROFEN 600 MG PO TABS: 600.0000 mg | ORAL_TABLET | Freq: Three times a day (TID) | ORAL | Status: DC

## 2013-05-16 NOTE — ED Notes (Signed)
Patient transported to X-ray 

## 2013-05-16 NOTE — ED Provider Notes (Signed)
CSN: [id number]     Arrival date & time 05/16/13  1926 History   First MD Initiated Contact with Patient 05/16/13 2141    This chart was scribed for non-physician practitioner Mellody Drown, PA-C working with Nelia Shi, MD by Valera Castle, ED scribe. This patient was seen in room TR09C/TR09C and the patient's care was started at 10:00 PM.  Chief Complaint  Patient presents with  . Foot Pain    The history is provided by the patient. No language interpreter was used.   HPI Comments: Lacey Vega is a 62 y.o. female who presents to the Emergency Department complaining of left heel pain onset 2 months ago. She states she has seen doctor and had Xrays done, and states she has not had results back yet. She reports sudden, moderate left heel pain, onset earlier today when she tweaked her foot, felt something pull around her heel and fell. She denies her heel pain radiating anywhere. She reports that walking does not exacerbate the pain, but states that as soon as she sits down she can feel the pain. She denies taking any pain medication for her pain. She denies any other symptoms. She reports an allergy to Oxycodone. Pt has medical h/o sickle cell anemia, hypothyroidism, HTN, DM, and gallstones.   PCP - August Saucer ERIC, MD  Past Medical History  Diagnosis Date  . Thyroid disease   . Hypertension   . Asthma   . GERD (gastroesophageal reflux disease)   . Colon polyps     hyperplastic  . Anxiety   . Diabetes mellitus   . Gallstones   . Hyperlipemia   . Hypothyroidism   . Anemia   . Sickle cell anemia    Past Surgical History  Procedure Laterality Date  . Cholecystectomy    . Abdominal hysterectomy    . Neck surgery    . Colonoscopy  12/03/2011    Procedure: COLONOSCOPY;  Surgeon: Shirley Friar, MD;  Location: [facility];  Service: Endoscopy;  Laterality: N/A;  . Esophagogastroduodenoscopy  12/03/2011    Procedure: ESOPHAGOGASTRODUODENOSCOPY (EGD);  Surgeon: Shirley Friar, MD;  Location: [facility];  Service: Endoscopy;  Laterality: N/A;  . Hot hemostasis  12/03/2011    Procedure: HOT HEMOSTASIS (ARGON PLASMA COAGULATION/BICAP);  Surgeon: Shirley Friar, MD;  Location: [facility];  Service: Endoscopy;  Laterality: N/A;   Family History  Problem Relation Age of Onset  . Diabetes Mother   . Heart attack Brother   . Cancer Maternal Aunt    History  Substance Use Topics  . Smoking status: Former Smoker    Types: Cigarettes    Quit date: 06/02/2000  . Smokeless tobacco: Never Used  . Alcohol Use: No   OB History   Grav Para Term Preterm Abortions TAB SAB Ect Mult Living                 Review of Systems  Musculoskeletal: Positive for arthralgias (left foot). Negative for gait problem.  All other systems reviewed and are negative.    Allergies  Oxycodone  Home Medications   Current Outpatient Rx  Name  Route  Sig  Dispense  Refill  . Calcium Carb-Vit D-C-E-Mineral (OS-CAL ULTRA) 600 MG TABS   Oral   Take 1 tablet by mouth daily.           . carisoprodol (SOMA) 350 MG tablet   Oral   Take 350 mg by mouth 2 (two) times daily as needed.           Marland Kitchen  CINNAMON PO   Oral   Take 100 mg by mouth daily. Plus Chromium          . estrogens, conjugated, (PREMARIN) 0.3 MG tablet   Oral   Take 0.3 mg by mouth daily. Take daily for 21 days then do not take for 7 days.          . ferrous sulfate 325 (65 FE) MG tablet   Oral   Take 325 mg by mouth 2 (two) times daily.           Marland Kitchen gabapentin (NEURONTIN) 100 MG tablet   Oral   Take 100 mg by mouth at bedtime.           Marland Kitchen HYDROcodone-acetaminophen (NORCO) 5-325 MG per tablet   Oral   Take 1 tablet by mouth every 4 (four) hours as needed.           Marland Kitchen levothyroxine (SYNTHROID, LEVOTHROID) 150 MCG tablet   Oral   Take 150 mcg by mouth daily.           Marland Kitchen METFORMIN HCL PO   Oral   Take 500 mg by mouth daily.           . metoprolol tartrate (LOPRESSOR) 25 MG  tablet   Oral   Take 25 mg by mouth 2 (two) times daily. 1/2 TAB          . montelukast (SINGULAIR) 10 MG tablet   Oral   Take 10 mg by mouth daily.           . Multiple Vitamins-Minerals (CENTRUM SILVER PO)   Oral   Take by mouth daily.           . Omega-3 Fatty Acids (FISH OIL) 1200 MG CAPS   Oral   Take by mouth daily.           Marland Kitchen omeprazole (PRILOSEC) 40 MG capsule   Oral   Take 40 mg by mouth daily.           Marland Kitchen spironolactone (ALDACTONE) 25 MG tablet   Oral   Take 25 mg by mouth daily.            BP 120/59  Pulse 77  Temp(Src) 98 F (36.7 C) (Oral)  Resp 16  SpO2 96%  Physical Exam  Nursing note and vitals reviewed. Constitutional: She is oriented to person, place, and time. She appears well-developed and well-nourished. No distress.  HENT:  Head: Normocephalic and atraumatic.  Eyes: EOM are normal.  Neck: Neck supple.  Cardiovascular: Normal rate.   Pulmonary/Chest: Effort normal. No respiratory distress.  Musculoskeletal: Normal range of motion.       Left foot: She exhibits tenderness. She exhibits normal range of motion and no swelling.       Feet:  Neurological: She is alert and oriented to person, place, and time.  Skin: Skin is warm and dry.  Psychiatric: She has a normal mood and affect. Her behavior is normal.    ED Course  Procedures (including critical care time)  DIAGNOSTIC STUDIES: Oxygen Saturation is 96% on room air, normal by my interpretation.    COORDINATION OF CARE: 10:05 PM-Discussed treatment plan which includes anti-inflammatory with pt at bedside and pt agreed to plan. Advised pt to apply ice.   Labs Review Labs Reviewed - No data to display Imaging Review Dg Foot Complete Left  05/16/2013   CLINICAL DATA:  Pain  EXAM: LEFT FOOT - COMPLETE 3+ VIEW  COMPARISON:  None.  FINDINGS: The frontal, oblique, and lateral views were obtained. No fracture or dislocation. There is mild narrowing of all PIP and DIP joints.  There is no erosive change. There are small posterior and inferior calcaneal spurs.  IMPRESSION: Narrowing of all PIP and DIP joints. Small calcaneal spurs. No fracture or dislocation.   Electronically Signed   By: Bretta Bang M.D.   On: 05/16/2013 21:16    EKG Interpretation   None       MDM   1. Heel spur, left    Pt with a history of foot discomfort presents with an exacerbation after an injury.  History and exam consistent with plantar fascitis.  XR reveals bone spurs.  Discussed imaging results, and treatment plan with the patient. Return precautions given. Reports understanding and no other concerns at this time.  Patient is stable for discharge at this time.  Meds given in ED:  Medications - No data to display  Discharge Medication List as of 05/16/2013 10:12 PM    START taking these medications   Details  ibuprofen (ADVIL,MOTRIN) 600 MG tablet Take 1 tablet (600 mg total) by mouth 3 (three) times daily with meals., Starting 05/16/2013, Until Discontinued, Print        I personally performed the services described in this documentation, which was scribed in my presence. The recorded information has been reviewed and is accurate.    Clabe Seal, PA-C 05/19/13 2114

## 2013-05-16 NOTE — ED Notes (Signed)
Pt. reports left foot pain since October this year , denies injury .

## 2013-05-29 NOTE — ED Provider Notes (Signed)
Medical screening examination/treatment/procedure(s) were performed by non-physician practitioner and as supervising physician I was immediately available for consultation/collaboration.   Nelia Shi, MD 05/29/13 [phone]

## 2013-11-18 ENCOUNTER — Other Ambulatory Visit: Payer: Self-pay | Admitting: Internal Medicine

## 2013-11-18 DIAGNOSIS — R1310 Dysphagia, unspecified: Secondary | ICD-10-CM

## 2013-11-21 ENCOUNTER — Ambulatory Visit
Admission: RE | Admit: 2013-11-21 | Discharge: 2013-11-21 | Disposition: A | Discharge status: Home / Self Care | Payer: 59 | Source: Ambulatory Visit | Attending: Internal Medicine | Admitting: Internal Medicine

## 2013-11-21 DIAGNOSIS — R1310 Dysphagia, unspecified: Secondary | ICD-10-CM

## 2014-01-11 NOTE — Telephone Encounter (Signed)
At 1225, Dr. Marlou Sa returned call, would advise pt to report to ER for Evaluation. Pt called with instructions to report to ER for Eval, pt verbalized  understanding of instructions.

## 2014-04-26 ENCOUNTER — Other Ambulatory Visit (HOSPITAL_COMMUNITY): Payer: Self-pay | Admitting: Cardiology

## 2014-04-26 DIAGNOSIS — R0789 Other chest pain: Secondary | ICD-10-CM

## 2014-05-05 ENCOUNTER — Encounter (HOSPITAL_COMMUNITY): Payer: 59

## 2014-08-21 ENCOUNTER — Ambulatory Visit
Admission: RE | Admit: 2014-08-21 | Discharge: 2014-08-21 | Disposition: A | Discharge status: Home / Self Care | Payer: 59 | Source: Ambulatory Visit | Attending: Internal Medicine | Admitting: Internal Medicine

## 2014-08-21 ENCOUNTER — Other Ambulatory Visit: Payer: Self-pay | Admitting: Internal Medicine

## 2014-08-21 DIAGNOSIS — R0602 Shortness of breath: Secondary | ICD-10-CM

## 2015-01-16 ENCOUNTER — Emergency Department (HOSPITAL_COMMUNITY)
Admission: EM | Admit: 2015-01-16 | Discharge: 2015-01-16 | Disposition: A | Discharge status: Home / Self Care | Payer: 59 | Attending: Emergency Medicine | Admitting: Emergency Medicine

## 2015-01-16 ENCOUNTER — Encounter (HOSPITAL_COMMUNITY): Payer: Self-pay | Admitting: *Deleted

## 2015-01-16 DIAGNOSIS — M545 Low back pain, unspecified: Secondary | ICD-10-CM

## 2015-01-16 DIAGNOSIS — Z791 Long term (current) use of non-steroidal anti-inflammatories (NSAID): Secondary | ICD-10-CM | POA: Diagnosis not present

## 2015-01-16 DIAGNOSIS — E039 Hypothyroidism, unspecified: Secondary | ICD-10-CM | POA: Insufficient documentation

## 2015-01-16 DIAGNOSIS — Z86010 Personal history of colonic polyps: Secondary | ICD-10-CM | POA: Diagnosis not present

## 2015-01-16 DIAGNOSIS — Z79899 Other long term (current) drug therapy: Secondary | ICD-10-CM | POA: Diagnosis not present

## 2015-01-16 DIAGNOSIS — F419 Anxiety disorder, unspecified: Secondary | ICD-10-CM | POA: Diagnosis not present

## 2015-01-16 DIAGNOSIS — D649 Anemia, unspecified: Secondary | ICD-10-CM | POA: Insufficient documentation

## 2015-01-16 DIAGNOSIS — I10 Essential (primary) hypertension: Secondary | ICD-10-CM | POA: Insufficient documentation

## 2015-01-16 DIAGNOSIS — Z8719 Personal history of other diseases of the digestive system: Secondary | ICD-10-CM | POA: Insufficient documentation

## 2015-01-16 DIAGNOSIS — J45909 Unspecified asthma, uncomplicated: Secondary | ICD-10-CM | POA: Diagnosis not present

## 2015-01-16 DIAGNOSIS — E785 Hyperlipidemia, unspecified: Secondary | ICD-10-CM | POA: Diagnosis not present

## 2015-01-16 DIAGNOSIS — E119 Type 2 diabetes mellitus without complications: Secondary | ICD-10-CM | POA: Insufficient documentation

## 2015-01-16 DIAGNOSIS — Z87891 Personal history of nicotine dependence: Secondary | ICD-10-CM | POA: Insufficient documentation

## 2015-01-16 MED ORDER — KETOROLAC TROMETHAMINE 30 MG/ML IJ SOLN
60.0000 mg | Freq: Once | INTRAMUSCULAR | Status: AC
  Administered 2015-01-16: 60 mg via INTRAMUSCULAR
  Filled 2015-01-16: qty 2

## 2015-01-16 MED ORDER — NAPROXEN 500 MG PO TABS: ORAL_TABLET | ORAL | Status: DC

## 2015-01-16 MED ORDER — CYCLOBENZAPRINE HCL 5 MG PO TABS: 5.0000 mg | ORAL_TABLET | Freq: Three times a day (TID) | ORAL | Status: DC | PRN

## 2015-01-16 NOTE — Discharge Instructions (Signed)
Try ice and heat to your pain for pain relief. Take the medications as prescribed. Recheck by your PCP if you aren't improving over the next week.  Heat Therapy Heat therapy can help make painful, stiff muscles and joints feel better. Do not use heat on new injuries. Wait at least 48 hours after an injury to use heat. Do not use heat when you have aches or pains right after an activity. If you still have pain 3 hours after stopping the activity, then you may use heat. HOME CARE Wet heat pack  Soak a clean towel in warm water. Squeeze out the extra water.  Put the warm, wet towel in a plastic bag.  Place a thin, dry towel between your skin and the bag.  Put the heat pack on the area for 5 minutes, and check your skin. Your skin may be pink, but it should not be red.  Leave the heat pack on the area for 15 to 30 minutes.  Repeat this every 2 to 4 hours while awake. Do not use heat while you are sleeping. Warm water bath  Fill a tub with warm water.  Place the affected body part in the tub.  Soak the area for 20 to 40 minutes.  Repeat as needed. Hot water bottle  Fill the water bottle half full with hot water.  Press out the extra air. Close the cap tightly.  Place a dry towel between your skin and the bottle.  Put the bottle on the area for 5 minutes, and check your skin. Your skin may be pink, but it should not be red.  Leave the bottle on the area for 15 to 30 minutes.  Repeat this every 2 to 4 hours while awake. Electric heating pad  Place a dry towel between your skin and the heating pad.  Set the heating pad on low heat.  Put the heating pad on the area for 10 minutes, and check your skin. Your skin may be pink, but it should not be red.  Leave the heating pad on the area for 20 to 40 minutes.  Repeat this every 2 to 4 hours while awake.  Do not lie on the heating pad.  Do not fall asleep while using the heating pad.  Do not use the heating pad near  water. GET HELP RIGHT AWAY IF:  You get blisters or red skin.  Your skin is puffy (swollen), or you lose feeling (numbness) in the affected area.  You have any new problems.  Your problems are getting worse.  You have any questions or concerns. If you have any problems, stop using heat therapy until you see your doctor. MAKE SURE YOU:  Understand these instructions.  Will watch your condition.  Will get help right away if you are not doing well or get worse. Document Released: 08/11/2011 Document Reviewed: 07/12/2013 [facility] Patient Information 2015 [place]. This information is not intended to replace advice given to you by your health care provider. Make sure you discuss any questions you have with your health care provider.

## 2015-01-16 NOTE — ED Notes (Signed)
Prescriptions and discharge instructions reviewed, voiced understanding.
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
## 2015-01-16 NOTE — ED Notes (Signed)
Patient stated she bent over to pull up a rail on the bed and when she stood up felt something catch in her back

## 2015-06-13 MED FILL — PREMARIN 0.3 MG TABLET: 0.3 | 30 days supply | Qty: 30 | Fill #1

## 2015-06-13 MED FILL — PRAVASTATIN NA 40 MG TAB: 40 | 30 days supply | Qty: 30 | Fill #1

## 2015-06-15 ENCOUNTER — Other Ambulatory Visit: Payer: Self-pay | Admitting: Gastroenterology

## 2015-06-15 DIAGNOSIS — R1310 Dysphagia, unspecified: Secondary | ICD-10-CM

## 2015-06-21 ENCOUNTER — Ambulatory Visit
Admission: RE | Admit: 2015-06-21 | Discharge: 2015-06-21 | Disposition: A | Discharge status: Home / Self Care | Payer: 59 | Source: Ambulatory Visit | Attending: Gastroenterology | Admitting: Gastroenterology

## 2015-06-21 DIAGNOSIS — K222 Esophageal obstruction: Secondary | ICD-10-CM | POA: Diagnosis not present

## 2015-06-21 DIAGNOSIS — R1310 Dysphagia, unspecified: Secondary | ICD-10-CM

## 2015-08-03 MED FILL — AZELASTINE 0.15% NASAL SPRY: 0.15 | 50 days supply | Qty: 30 | Fill #0

## 2015-08-06 MED FILL — MONTELUKAST SOD 10 MG TAB: 10 | 30 days supply | Qty: 30 | Fill #0

## 2015-08-06 MED FILL — AZITHROMYCIN 250 MG TABLET: 250 | 5 days supply | Qty: 6 | Fill #0

## 2015-08-14 MED FILL — LEVOTHYROXINE 150 MCG TAB: 150 | 90 days supply | Qty: 90 | Fill #1

## 2015-08-14 MED FILL — OMEPRAZOLE DR 40 MG CAPSULE: 40 | 90 days supply | Qty: 90 | Fill #1

## 2015-08-14 MED FILL — PRAVASTATIN NA 40 MG TAB: 40 | 90 days supply | Qty: 90 | Fill #0

## 2015-08-15 ENCOUNTER — Other Ambulatory Visit: Payer: Self-pay | Admitting: Cardiology

## 2015-08-15 DIAGNOSIS — D649 Anemia, unspecified: Secondary | ICD-10-CM | POA: Diagnosis not present

## 2015-08-15 DIAGNOSIS — R079 Chest pain, unspecified: Secondary | ICD-10-CM

## 2015-08-15 DIAGNOSIS — I10 Essential (primary) hypertension: Secondary | ICD-10-CM | POA: Diagnosis not present

## 2015-08-15 DIAGNOSIS — E119 Type 2 diabetes mellitus without complications: Secondary | ICD-10-CM | POA: Diagnosis not present

## 2015-08-15 DIAGNOSIS — I209 Angina pectoris, unspecified: Secondary | ICD-10-CM | POA: Diagnosis not present

## 2015-08-22 ENCOUNTER — Encounter (HOSPITAL_COMMUNITY)
Admission: RE | Admit: 2015-08-22 | Discharge: 2015-08-22 | Disposition: A | Discharge status: Home / Self Care | Payer: 59 | Source: Ambulatory Visit | Attending: Cardiology | Admitting: Cardiology

## 2015-08-22 ENCOUNTER — Encounter (HOSPITAL_COMMUNITY): Payer: Self-pay

## 2015-08-22 DIAGNOSIS — E119 Type 2 diabetes mellitus without complications: Secondary | ICD-10-CM | POA: Diagnosis not present

## 2015-08-22 DIAGNOSIS — R079 Chest pain, unspecified: Secondary | ICD-10-CM | POA: Insufficient documentation

## 2015-08-22 DIAGNOSIS — I10 Essential (primary) hypertension: Secondary | ICD-10-CM | POA: Diagnosis not present

## 2015-08-22 DIAGNOSIS — I209 Angina pectoris, unspecified: Secondary | ICD-10-CM | POA: Diagnosis not present

## 2015-08-22 DIAGNOSIS — E785 Hyperlipidemia, unspecified: Secondary | ICD-10-CM | POA: Diagnosis not present

## 2015-08-22 MED ORDER — TECHNETIUM TC 99M SESTAMIBI GENERIC - CARDIOLITE
10.0000 | Freq: Once | INTRAVENOUS | Status: AC | PRN
  Administered 2015-08-22: 10 via INTRAVENOUS

## 2015-08-22 MED ORDER — TECHNETIUM TC 99M SESTAMIBI - CARDIOLITE
30.0000 | Freq: Once | INTRAVENOUS | Status: AC | PRN
  Administered 2015-08-22: 11:00:00 30 via INTRAVENOUS

## 2015-08-22 MED ORDER — REGADENOSON 0.4 MG/5ML IV SOLN
INTRAVENOUS | Status: AC
  Administered 2015-08-22: 0.4 mg via INTRAVENOUS
  Filled 2015-08-22: qty 5

## 2015-08-22 MED ORDER — REGADENOSON 0.4 MG/5ML IV SOLN
0.4000 mg | Freq: Once | INTRAVENOUS | Status: AC
  Administered 2015-08-22: 0.4 mg via INTRAVENOUS

## 2015-08-24 ENCOUNTER — Other Ambulatory Visit: Payer: Self-pay | Admitting: Gastroenterology

## 2015-08-27 MED FILL — METOPROLOL TARTRATE 25 MG T: 25 | 90 days supply | Qty: 90 | Fill #0

## 2015-08-27 MED FILL — LEVOCETIRIZINE 5 MG TABLET: 5 | 90 days supply | Qty: 90 | Fill #0

## 2015-08-27 MED FILL — KLOR-CON M20 TABLET: 20 | 90 days supply | Qty: 180 | Fill #0

## 2015-08-28 ENCOUNTER — Encounter (HOSPITAL_COMMUNITY): Payer: Self-pay | Admitting: *Deleted

## 2015-08-28 MED ORDER — SODIUM CHLORIDE 0.9 % IV SOLN: INTRAVENOUS | Status: DC

## 2015-08-28 NOTE — Progress Notes (Signed)
Pt denies SOB and chest pain but is under the care of Dr. Terrence Dupont, Cardiology. Pt denies having an echo and cardiac cath but stated that a stress test was performed on Wednesday 08/22/15. Pt stated, Dr. Terrence Dupont said that the test was abnormal; please request records DOS. Pt made aware to stop taking Aspirin, vitamins, fish oil, Cinnamon and herbal medications. Do not take any NSAIDs ie: Ibuprofen, Advil, Naproxen, BC and Goody Powders or any medication containing Aspirin. Pt verbalized understanding of all pre-op instructions.

## 2015-08-28 NOTE — Anesthesia Preprocedure Evaluation (Addendum)
Anesthesia Evaluation  Patient identified by MRN, date of birth, ID band Patient awake    Reviewed: Allergy & Precautions, NPO status , Patient's Chart, lab work & pertinent test results  History of Anesthesia Complications Negative for: history of anesthetic complications  Airway Mallampati: II  TM Distance: >3 FB Neck ROM: Full    Dental no notable dental hx. (+) Edentulous Upper, Edentulous Lower   Pulmonary asthma , COPD, former smoker,    Pulmonary exam normal breath sounds clear to auscultation       Cardiovascular hypertension, Pt. on medications Normal cardiovascular exam Rhythm:Regular Rate:Normal     Neuro/Psych  Headaches, PSYCHIATRIC DISORDERS Anxiety    GI/Hepatic Neg liver ROS, GERD  ,  Endo/Other  diabetesHypothyroidism obesity  Renal/GU negative Renal ROS  negative genitourinary   Musculoskeletal negative musculoskeletal ROS (+)   Abdominal   Peds negative pediatric ROS (+)  Hematology  (+) anemia ,   Anesthesia Other Findings   Reproductive/Obstetrics negative OB ROS                            Anesthesia Physical Anesthesia Plan  ASA: III  Anesthesia Plan: MAC   Post-op Pain Management:    Induction: Intravenous  Airway Management Planned: Nasal Cannula  Additional Equipment:   Intra-op Plan:   Post-operative Plan:   Informed Consent: I have reviewed the patients History and Physical, chart, labs and discussed the procedure including the risks, benefits and alternatives for the proposed anesthesia with the patient or authorized representative who has indicated his/her understanding and acceptance.   Dental advisory given  Plan Discussed with: CRNA  Anesthesia Plan Comments:         Anesthesia Quick Evaluation

## 2015-08-29 ENCOUNTER — Ambulatory Visit (HOSPITAL_COMMUNITY)
Admission: RE | Admit: 2015-08-29 | Discharge: 2015-08-29 | Disposition: A | Discharge status: Home / Self Care | Payer: 59 | Source: Ambulatory Visit | Attending: Gastroenterology | Admitting: Gastroenterology

## 2015-08-29 ENCOUNTER — Ambulatory Visit (HOSPITAL_COMMUNITY): Payer: 59 | Admitting: Anesthesiology

## 2015-08-29 ENCOUNTER — Encounter (HOSPITAL_COMMUNITY)
Admission: RE | Disposition: A | Discharge status: Home / Self Care | Payer: Self-pay | Source: Ambulatory Visit | Attending: Gastroenterology

## 2015-08-29 ENCOUNTER — Encounter (HOSPITAL_COMMUNITY): Payer: Self-pay | Admitting: *Deleted

## 2015-08-29 DIAGNOSIS — R1310 Dysphagia, unspecified: Secondary | ICD-10-CM | POA: Diagnosis not present

## 2015-08-29 DIAGNOSIS — I10 Essential (primary) hypertension: Secondary | ICD-10-CM | POA: Diagnosis not present

## 2015-08-29 DIAGNOSIS — K31819 Angiodysplasia of stomach and duodenum without bleeding: Secondary | ICD-10-CM | POA: Diagnosis not present

## 2015-08-29 DIAGNOSIS — E119 Type 2 diabetes mellitus without complications: Secondary | ICD-10-CM | POA: Diagnosis not present

## 2015-08-29 DIAGNOSIS — K222 Esophageal obstruction: Secondary | ICD-10-CM | POA: Insufficient documentation

## 2015-08-29 HISTORY — PX: ESOPHAGOGASTRODUODENOSCOPY (EGD) WITH PROPOFOL: SHX5813

## 2015-08-29 HISTORY — DX: Headache: R51

## 2015-08-29 HISTORY — PX: BOTOX INJECTION: SHX5754

## 2015-08-29 HISTORY — DX: Headache, unspecified: R51.9

## 2015-08-29 LAB — GLUCOSE, CAPILLARY: GLUCOSE-CAPILLARY: 98 mg/dL (ref 65–99)

## 2015-08-29 SURGERY — ESOPHAGOGASTRODUODENOSCOPY (EGD) WITH PROPOFOL
Anesthesia: Monitor Anesthesia Care

## 2015-08-29 MED ORDER — ONABOTULINUMTOXINA 100 UNITS IJ SOLR
100.0000 [IU] | Freq: Once | INTRAMUSCULAR | Status: DC
  Filled 2015-08-29: qty 100

## 2015-08-29 MED ORDER — ONABOTULINUMTOXINA 100 UNITS IJ SOLR
INTRAMUSCULAR | Status: DC | PRN
  Administered 2015-08-29: 100 [IU] via INTRAMUSCULAR

## 2015-08-29 MED ORDER — PROPOFOL 500 MG/50ML IV EMUL
INTRAVENOUS | Status: DC | PRN
  Administered 2015-08-29: 100 ug/kg/min via INTRAVENOUS

## 2015-08-29 MED ORDER — SODIUM CHLORIDE 0.9 % IJ SOLN
INTRAMUSCULAR | Status: AC
  Filled 2015-08-29: qty 10

## 2015-08-29 MED ORDER — LIDOCAINE HCL (CARDIAC) 20 MG/ML IV SOLN
INTRAVENOUS | Status: DC | PRN
  Administered 2015-08-29: 90 mg via INTRATRACHEAL

## 2015-08-29 MED ORDER — BUTAMBEN-TETRACAINE-BENZOCAINE 2-2-14 % EX AERO
INHALATION_SPRAY | CUTANEOUS | Status: DC | PRN
  Administered 2015-08-29: 2 via TOPICAL

## 2015-08-29 MED ORDER — LACTATED RINGERS IV SOLN
INTRAVENOUS | Status: DC
  Administered 2015-08-29: 09:00:00 via INTRAVENOUS

## 2015-08-29 MED ORDER — PROPOFOL 10 MG/ML IV BOLUS
INTRAVENOUS | Status: DC | PRN
  Administered 2015-08-29 (×2): 20 mg via INTRAVENOUS

## 2015-08-29 MED FILL — metFORMIN HCL 500 MG TABS: 500 | 90 days supply | Qty: 90 | Fill #0

## 2015-08-29 NOTE — Anesthesia Procedure Notes (Signed)
Procedure Name: MAC Date/Time: 08/29/2015 10:13 AM Performed by: Barrington Ellison Pre-anesthesia Checklist: Patient identified, Emergency Drugs available, Suction available, Patient being monitored and Timeout performed Patient Re-evaluated:Patient Re-evaluated prior to inductionOxygen Delivery Method: Nasal cannula

## 2015-08-29 NOTE — H&P (Signed)
  Date of Initial H&P: 08/22/15  History reviewed, patient examined, no change in status, stable for surgery.

## 2015-08-29 NOTE — Discharge Instructions (Signed)

## 2015-08-29 NOTE — Anesthesia Postprocedure Evaluation (Signed)
Anesthesia Post Note  Patient: Lacey Vega  Procedure(s) Performed: Procedure(s) (LRB): ESOPHAGOGASTRODUODENOSCOPY (EGD) WITH PROPOFOL (N/A) BOTOX INJECTION  Patient location during evaluation: PACU Anesthesia Type: MAC Level of consciousness: awake and alert Pain management: pain level controlled Vital Signs Assessment: post-procedure vital signs reviewed and stable Respiratory status: spontaneous breathing, nonlabored ventilation, respiratory function stable and patient connected to nasal cannula oxygen Cardiovascular status: blood pressure returned to baseline and stable Postop Assessment: no signs of nausea or vomiting Anesthetic complications: no    Last Vitals:  Filed Vitals:   08/29/15 1050 08/29/15 1100  BP: 138/72 140/71  Pulse: 70 67  Temp:    Resp: 15 26    Last Pain: There were no vitals filed for this visit.               Ahmeer Tuman JENNETTE

## 2015-08-29 NOTE — Op Note (Signed)
[facility] Patient Name: Lacey Vega Procedure Date : 08/29/2015 MRN: [id number] Attending MD: [name] , MD Date of Birth: Aug 20, 1950 CSN: [id number] Age: 65 Admit Type: Inpatient Procedure:                Upper GI endoscopy Indications:              Dysphagia Providers:                [name], MD, Jobe Igo, RN,                            Corliss Parish, Technician Referring MD:              Medicines:                Propofol per Anesthesia, Monitored Anesthesia Care Complications:            No immediate complications. Estimated Blood Loss:     Estimated blood loss: none. Procedure:                Pre-Anesthesia Assessment:                           - Prior to the procedure, a History and Physical                            was performed, and patient medications and                            allergies were reviewed. The patient's tolerance of                            previous anesthesia was also reviewed. The risks                            and benefits of the procedure and the sedation                            options and risks were discussed with the patient.                            All questions were answered, and informed consent                            was obtained. Prior Anticoagulants: The patient has                            taken no previous anticoagulant or antiplatelet                            agents. ASA Grade Assessment: III - A patient with                            severe systemic disease. After reviewing the risks  and benefits, the patient was deemed in                            satisfactory condition to undergo the procedure.                           After obtaining informed consent, the endoscope was                            passed under direct vision. Throughout the                            procedure, the patient's blood pressure, pulse, and       oxygen saturations were monitored continuously. The                            [id number]) scope was introduced through the                            mouth, and advanced to the second part of duodenum.                            The upper GI endoscopy was accomplished without                            difficulty. The patient tolerated the procedure                            well. Scope In: Scope Out: Findings:      The oropharynx was normal.      The Z-line was regular and was found 38 cm from the incisors.      One mild benign-appearing, intrinsic stenosis was found at the       gastroesophageal junction. And was traversed. Area was successfully       injected with 100 units botulinum toxin. Estimated blood loss was       minimal.      A non-bleeding diverticulum with a small opening and no stigmata of       recent bleeding was found in the lower third of the esophagus.      The entire examined stomach was normal.      A few small angiodysplastic lesions without bleeding were found in the       second portion of the duodenum.      The exam of the duodenum was otherwise normal. Impression:               - Normal oropharynx.                           - Z-line regular, 38 cm from the incisors.                           - Benign-appearing esophageal stenosis. Injected                            with botulinum toxin.                           -  Diverticulum in the lower third of the esophagus.                           - Normal stomach.                           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                           - No specimens collected. Moderate Sedation:      MAC Recommendation:           - Patient has a contact number available for                            emergencies. The signs and symptoms of potential                            delayed complications were discussed with the                             patient. Return to normal activities tomorrow.                            Written discharge instructions were provided to the                            patient.                           - Return to GI office in 3 months.                           - Resume previous diet.                           - Continue present medications. Procedure Code(s):        --- Professional ---                           [phone], Esophagogastroduodenoscopy, flexible,                            transoral; with directed submucosal injection(s),                            any substance Diagnosis Code(s):        --- Professional ---                           K22.2, Esophageal obstruction                           R13.10, Dysphagia, unspecified  K31.819, Angiodysplasia of stomach and duodenum                            without bleeding                           Q39.6, Congenital diverticulum of esophagus CPT copyright 2016 [employer]. All rights reserved. The codes documented in this report are preliminary and upon coder review may  be revised to meet current compliance requirements. Wilford Corner, MD [name], MD 08/29/2015 10:44:10 AM This report has been signed electronically. Number of Addenda: 0

## 2015-08-29 NOTE — Interval H&P Note (Signed)
History and Physical Interval Note:  08/29/2015 10:11 AM  Lacey Vega  has presented today for surgery, with the diagnosis of trouble swallowing   The various methods of treatment have been discussed with the patient and family. After consideration of risks, benefits and other options for treatment, the patient has consented to  Procedure(s): ESOPHAGOGASTRODUODENOSCOPY (EGD) WITH PROPOFOL (N/A) SAVORY DILATION (N/A) as a surgical intervention .  The patient's history has been reviewed, patient examined, no change in status, stable for surgery.  I have reviewed the patient's chart and labs.  Questions were answered to the patient's satisfaction.     Heyburn C.

## 2015-08-29 NOTE — Transfer of Care (Signed)
Immediate Anesthesia Transfer of Care Note  Patient: Lacey Vega  Procedure(s) Performed: Procedure(s): ESOPHAGOGASTRODUODENOSCOPY (EGD) WITH PROPOFOL (N/A) BOTOX INJECTION  Patient Location: Endoscopy Unit  Anesthesia Type:MAC  Level of Consciousness: awake  Airway & Oxygen Therapy: Patient Spontanous Breathing  Post-op Assessment: Report given to RN  Post vital signs: Reviewed and stable  Last Vitals:  Filed Vitals:   08/29/15 0829 08/29/15 1035  BP: 148/69   Pulse: 64 75  Temp: 36.7 C   Resp: 20     Complications: No apparent anesthesia complications

## 2015-08-30 ENCOUNTER — Encounter (HOSPITAL_COMMUNITY): Payer: Self-pay | Admitting: Gastroenterology

## 2015-09-11 MED FILL — MONTELUKAST SOD 10 MG TAB: 10 | 30 days supply | Qty: 30 | Fill #1

## 2015-09-15 MED FILL — AZELASTINE 0.15% NASAL SPRY: 0.15 | 50 days supply | Qty: 30 | Fill #1

## 2015-11-09 MED FILL — CEFDINIR 300 MG CAPSULE: 300 | 7 days supply | Qty: 14 | Fill #0

## 2015-11-12 DIAGNOSIS — E119 Type 2 diabetes mellitus without complications: Secondary | ICD-10-CM | POA: Diagnosis not present

## 2015-11-12 DIAGNOSIS — E039 Hypothyroidism, unspecified: Secondary | ICD-10-CM | POA: Diagnosis not present

## 2015-11-12 DIAGNOSIS — D649 Anemia, unspecified: Secondary | ICD-10-CM | POA: Diagnosis not present

## 2015-11-12 MED FILL — METOPROLOL TARTRATE 25 MG T: 25 | 90 days supply | Qty: 90 | Fill #1

## 2015-11-12 MED FILL — metFORMIN HCL 500 MG TABS: 500 | 90 days supply | Qty: 90 | Fill #1

## 2015-11-12 MED FILL — LEVOTHYROXINE 150 MCG TAB: 150 | 90 days supply | Qty: 90 | Fill #2

## 2015-11-12 MED FILL — AZELASTINE 0.15% NASAL SPRY: 0.15 | 50 days supply | Qty: 30 | Fill #2

## 2015-11-12 MED FILL — PRAVASTATIN NA 40 MG TAB: 40 | 90 days supply | Qty: 90 | Fill #1

## 2015-11-12 MED FILL — MONTELUKAST SOD 10 MG TAB: 10 | 30 days supply | Qty: 30 | Fill #2

## 2015-11-21 DIAGNOSIS — E119 Type 2 diabetes mellitus without complications: Secondary | ICD-10-CM | POA: Diagnosis not present

## 2015-11-21 DIAGNOSIS — I10 Essential (primary) hypertension: Secondary | ICD-10-CM | POA: Diagnosis not present

## 2015-11-21 DIAGNOSIS — D649 Anemia, unspecified: Secondary | ICD-10-CM | POA: Diagnosis not present

## 2015-11-21 DIAGNOSIS — I208 Other forms of angina pectoris: Secondary | ICD-10-CM | POA: Diagnosis not present

## 2015-11-22 MED FILL — CEFDINIR 300 MG CAPSULE: 300 | 7 days supply | Qty: 14 | Fill #1

## 2016-09-12 ENCOUNTER — Other Ambulatory Visit: Payer: Self-pay | Admitting: Neurosurgery

## 2016-09-24 ENCOUNTER — Encounter (HOSPITAL_COMMUNITY): Payer: Self-pay

## 2016-09-24 NOTE — Pre-Procedure Instructions (Addendum)
Lacey Vega  09/24/2016      [place], [place] - [address] [place] [address] Phone: [phone] Fax: [phone]    Your procedure is scheduled on May 4  Report to [facility] at 715 A.M.  Call this number if you have problems the morning of surgery:  [phone]   Remember:  Do not eat food or drink liquids after midnight.  Take these medicines the morning of surgery with A SIP OF WATER levothyroxine (SYNTHROID, LEVOTHROID), metoprolol tartrate (LOPRESSOR), montelukast (SINGULAIR), Nitro if needed, omeprazole (PRILOSEC),  potassium chloride SA  Do not take oral diabetes medicines (pills) the morning of surgery.  Metformin  Stop taking aspirin, BC's, Goody's, herbal medications, Fish Oil, Vitamins, Naproxen (Naprosyn), Aleve, Ibuprofen, Advil, Motrin   How to Manage Your Diabetes Before and After Surgery  Why is it important to control my blood sugar before and after surgery? . Improving blood sugar levels before and after surgery helps healing and can limit problems. . A way of improving blood sugar control is eating a healthy diet by: o  Eating less sugar and carbohydrates o  Increasing activity/exercise o  Talking with your doctor about reaching your blood sugar goals . High blood sugars (greater than 180 mg/dL) can raise your risk of infections and slow your recovery, so you will need to focus on controlling your diabetes during the weeks before surgery. . Make sure that the doctor who takes care of your diabetes knows about your planned surgery including the date and location.  How do I manage my blood sugar before surgery? . Check your blood sugar at least 4 times a day, starting 2 days before surgery, to make sure that the level is not too high or low. o Check your blood sugar the morning of your surgery when you wake up and every 2 hours until you get to the Short  Stay unit. . If your blood sugar is less than 70 mg/dL, you will need to treat for low blood sugar: o Do not take insulin. o Treat a low blood sugar (less than 70 mg/dL) with  cup of clear juice (cranberry or apple), 4 glucose tablets, OR glucose gel. o Recheck blood sugar in 15 minutes after treatment (to make sure it is greater than 70 mg/dL). If your blood sugar is not greater than 70 mg/dL on recheck, call [phone] for further instructions. . Report your blood sugar to the short stay nurse when you get to Short Stay.  . If you are admitted to the hospital after surgery: o Your blood sugar will be checked by the staff and you will probably be given insulin after surgery (instead of oral diabetes medicines) to make sure you have good blood sugar levels. o The goal for blood sugar control after surgery is 80-180 mg/dL     Do not wear jewelry, make-up or nail polish.  Do not wear lotions, powders, or perfumes, or deoderant.  Do not shave 48 hours prior to surgery.  Men may shave face and neck.  Do not bring valuables to the hospital.  [facility] is not responsible for any belongings or valuables.  Contacts, dentures or bridgework may not be worn into surgery.  Leave your suitcase in the car.  After surgery it may be brought to your room.  For patients admitted to the hospital, discharge time will be determined by your treatment team.  Patients  discharged the day of surgery will not be allowed to drive home.    [facility]- Preparing For Surgery  Before surgery, you can play an important role. Because skin is not sterile, your skin needs to be as free of germs as possible. You can reduce the number of germs on your skin by washing with CHG (chlorahexidine gluconate) Soap before surgery.  CHG is an antiseptic cleaner which kills germs and bonds with the skin to continue killing germs even after washing.  Please do not use if you have an allergy to CHG or antibacterial soaps. If your  skin becomes reddened/irritated stop using the CHG.  Do not shave (including legs and underarms) for at least 48 hours prior to first CHG shower. It is OK to shave your face.  Please follow these instructions carefully.   1. Shower the NIGHT BEFORE SURGERY and the MORNING OF SURGERY with CHG.   2. If you chose to wash your hair, wash your hair first as usual with your normal shampoo.  3. After you shampoo, rinse your hair and body thoroughly to remove the shampoo.  4. Use CHG as you would any other liquid soap. You can apply CHG directly to the skin and wash gently with a scrungie or a clean washcloth.   5. Apply the CHG Soap to your body ONLY FROM THE NECK DOWN.  Do not use on open wounds or open sores. Avoid contact with your eyes, ears, mouth and genitals (private parts). Wash genitals (private parts) with your normal soap.  6. Wash thoroughly, paying special attention to the area where your surgery will be performed.  7. Thoroughly rinse your body with warm water from the neck down.  8. DO NOT shower/wash with your normal soap after using and rinsing off the CHG Soap.  9. Pat yourself dry with a CLEAN TOWEL.   10. Wear CLEAN PAJAMAS   11. Place CLEAN SHEETS on your bed the night of your first shower and DO NOT SLEEP WITH PETS.    Day of Surgery: Do not apply any deodorants/lotions. Please wear clean clothes to the hospital/surgery center.     Please read over the following fact sheets that you were given. Pain Booklet, Coughing and Deep Breathing, MRSA Information and Surgical Site Infection Prevention

## 2016-09-25 ENCOUNTER — Encounter (HOSPITAL_COMMUNITY): Payer: Self-pay

## 2016-09-25 ENCOUNTER — Encounter (HOSPITAL_COMMUNITY)
Admission: RE | Admit: 2016-09-25 | Discharge: 2016-09-25 | Disposition: A | Discharge status: Home / Self Care | Payer: Medicare HMO | Source: Ambulatory Visit | Attending: Neurosurgery | Admitting: Neurosurgery

## 2016-09-25 DIAGNOSIS — Z7982 Long term (current) use of aspirin: Secondary | ICD-10-CM | POA: Diagnosis not present

## 2016-09-25 DIAGNOSIS — E039 Hypothyroidism, unspecified: Secondary | ICD-10-CM | POA: Insufficient documentation

## 2016-09-25 DIAGNOSIS — Z7984 Long term (current) use of oral hypoglycemic drugs: Secondary | ICD-10-CM | POA: Insufficient documentation

## 2016-09-25 DIAGNOSIS — I10 Essential (primary) hypertension: Secondary | ICD-10-CM | POA: Insufficient documentation

## 2016-09-25 DIAGNOSIS — Z0183 Encounter for blood typing: Secondary | ICD-10-CM | POA: Insufficient documentation

## 2016-09-25 DIAGNOSIS — Z01812 Encounter for preprocedural laboratory examination: Secondary | ICD-10-CM | POA: Diagnosis not present

## 2016-09-25 DIAGNOSIS — Z79899 Other long term (current) drug therapy: Secondary | ICD-10-CM | POA: Insufficient documentation

## 2016-09-25 DIAGNOSIS — E119 Type 2 diabetes mellitus without complications: Secondary | ICD-10-CM | POA: Diagnosis not present

## 2016-09-25 DIAGNOSIS — Z7951 Long term (current) use of inhaled steroids: Secondary | ICD-10-CM | POA: Insufficient documentation

## 2016-09-25 DIAGNOSIS — Z8673 Personal history of transient ischemic attack (TIA), and cerebral infarction without residual deficits: Secondary | ICD-10-CM | POA: Diagnosis not present

## 2016-09-25 DIAGNOSIS — D571 Sickle-cell disease without crisis: Secondary | ICD-10-CM | POA: Insufficient documentation

## 2016-09-25 DIAGNOSIS — M5000 Cervical disc disorder with myelopathy, unspecified cervical region: Secondary | ICD-10-CM | POA: Diagnosis not present

## 2016-09-25 DIAGNOSIS — J45909 Unspecified asthma, uncomplicated: Secondary | ICD-10-CM | POA: Insufficient documentation

## 2016-09-25 DIAGNOSIS — Z01818 Encounter for other preprocedural examination: Secondary | ICD-10-CM | POA: Diagnosis not present

## 2016-09-25 DIAGNOSIS — K219 Gastro-esophageal reflux disease without esophagitis: Secondary | ICD-10-CM | POA: Diagnosis not present

## 2016-09-25 DIAGNOSIS — Z87891 Personal history of nicotine dependence: Secondary | ICD-10-CM | POA: Insufficient documentation

## 2016-09-25 DIAGNOSIS — E785 Hyperlipidemia, unspecified: Secondary | ICD-10-CM | POA: Insufficient documentation

## 2016-09-25 HISTORY — DX: Personal history of other diseases of the digestive system: Z87.19

## 2016-09-25 HISTORY — DX: Bronchitis, not specified as acute or chronic: J40

## 2016-09-25 HISTORY — DX: Transient cerebral ischemic attack, unspecified: G45.9

## 2016-09-25 HISTORY — DX: Dyspnea, unspecified: R06.00

## 2016-09-25 LAB — BASIC METABOLIC PANEL
Anion gap: 9 (ref 5–15)
BUN: 9 mg/dL (ref 6–20)
CO2: 33 mmol/L — ABNORMAL HIGH (ref 22–32)
CREATININE: 0.93 mg/dL (ref 0.44–1.00)
Calcium: 9.1 mg/dL (ref 8.9–10.3)
Chloride: 103 mmol/L (ref 101–111)
GFR calc Af Amer: >60 mL/min (ref >60)
Glucose, Bld: 115 mg/dL — ABNORMAL HIGH (ref 65–99)
Potassium: 2.7 mmol/L — CL (ref 3.5–5.1)
Sodium: 145 mmol/L (ref 135–145)

## 2016-09-25 LAB — SURGICAL PCR SCREEN
MRSA, PCR: NEGATIVE
STAPHYLOCOCCUS AUREUS: NEGATIVE

## 2016-09-25 LAB — CBC
HCT: 31.2 % — ABNORMAL LOW (ref 36.0–46.0)
Hemoglobin: 9.4 g/dL — ABNORMAL LOW (ref 12.0–15.0)
MCH: 23.4 pg — ABNORMAL LOW (ref 26.0–34.0)
MCHC: 30.1 g/dL (ref 30.0–36.0)
MCV: 77.8 fL — ABNORMAL LOW (ref 78.0–100.0)
PLATELETS: 291 10*3/uL (ref 150–400)
RBC: 4.01 MIL/uL (ref 3.87–5.11)
RDW: 15.8 % — AB (ref 11.5–15.5)
WBC: 4.1 10*3/uL (ref 4.0–10.5)

## 2016-09-25 LAB — GLUCOSE, CAPILLARY: GLUCOSE-CAPILLARY: 108 mg/dL — AB (ref 65–99)

## 2016-09-25 NOTE — Progress Notes (Signed)
Positive antibioties in type and screen will need redraw on day of surgery

## 2016-09-25 NOTE — Progress Notes (Signed)
PCP - Kevan Ny Cardiologist - Harwani   Chest x-ray - not needed EKG - 09/25/16 Stress Test - 08/22/15 ECHO - said that she had one but cant remember when requesting from cardiologist Cardiac Cath - denies  Fasting Blood Sugar - does not know she does not monitor her sugars and does not know when her last a1c was checked Checks Blood Sugar ___0__ times a day  Sending to anestheisa for review of cardiology records that have been requested Patient stated that she is not taking aspirin at this time   Patient denies shortness of breath, fever, cough and chest pain at PAT appointment   Patient verbalized understanding of instructions that was given to them at the PAT appointment. Patient expressed that there were no further questions.  Patient was also instructed that they will need to review over the PAT instructions again at home before the surgery.

## 2016-09-25 NOTE — Progress Notes (Signed)
CRITICAL VALUE ALERT  Critical value received:  K. 2.7  Date of notification:  09/25/16  Time of notification:  1146  Critical value read back:Yes.    Nurse who received alert:  Sheralyn Boatman, RN  MD notified (called):  Levada Dy, NP  Time of first page:  1059  Also calling Dr. Marlou Sa patients PCP and Notifying surgeons office Dr. Christella Noa  Responding MD:  Levada Dy NP  Time MD responded:  [phone]

## 2016-09-25 NOTE — Progress Notes (Addendum)
Spoke with patients PCP Dr. Kevan Ny.  Dr Marlou Sa will take care of the low Potassium level for the patient   Dr. Lacy Duverney office will be notified  1:41 PM Lexine Baton from [facility] office stated that she receive my message

## 2016-09-26 LAB — HEMOGLOBIN A1C
Hgb A1c MFr Bld: 6.2 % — ABNORMAL HIGH (ref 4.8–5.6)
Mean Plasma Glucose: 131 mg/dL

## 2016-09-26 NOTE — Progress Notes (Addendum)
Anesthesia Chart Review:  Pt is a 66 year old female told for C3-4 ACDF on 10/03/2016 with Ashok Pall, M.D.  - PCP is Kevan Ny, M.D. -Cardiologist is Charolette Forward, M.D, last office visit 08/20/16 - GI is Wilford Corner, MD.   PMH includes: HTN, DM, hyperlipidemia, asthma, hypothyroidism, TIA, sickle cell anemia, GERD. Former smoker. BMI 35.  Medications include: ASA 81 mg, Symbicort, iron, Lasix, levothyroxine, metformin, metoprolol, Prilosec, potassium, pravastatin.  Preoperative labs reviewed. -K is 2.7. PAT RN notified PCP and surgeon's office. Dr. Marlou Sa is to treat hypokalemia.  Will recheck K DOS. - HbA1c is 6.2, glucose 115. - H/H 9.4/31.2. This is consistent with recent prior labs at [facility] office as well as older results in [place].  I notified Nikki in Dr. Lacy Duverney office of anemia.   EKG 09/25/16: NSR. Septal infarct, old  Nuclear stress test 08/22/15:  1. Small moderate fixed perfusion defect involving the inferior wall towards the apex (segment 15), with a questionable small reversible perfusion defect at the apex (segment 17). 2. Normal left ventricular wall motion. 3. Left ventricular ejection fraction 64% 4. Low-risk stress test findings  Echo 05/25/15 (Dr. Zenia Resides office):  1. Normal LV size. LV systolic function is normal with normal EF 55-60%. LV shows normal contractility. 2. Mild mitral regurgitation. 3. Trace pulmonic regurgitation. 4. Mild tricuspid regurgitation.  If no changes, I anticipate pt can proceed with surgery as scheduled.   Willeen Cass, FNP-BC [facility] Short Stay Surgical Center/Anesthesiology Phone: [phone] 10/01/2016 5:02 PM

## 2016-10-02 ENCOUNTER — Encounter (HOSPITAL_COMMUNITY): Payer: Self-pay | Admitting: Anesthesiology

## 2016-10-03 ENCOUNTER — Ambulatory Visit (HOSPITAL_COMMUNITY)
Admission: RE | Admit: 2016-10-03 | Discharge: 2016-10-04 | Disposition: A | Discharge status: Home / Self Care | Payer: Medicare HMO | Source: Ambulatory Visit | Attending: Neurosurgery | Admitting: Neurosurgery

## 2016-10-03 ENCOUNTER — Ambulatory Visit (HOSPITAL_COMMUNITY): Payer: Medicare HMO

## 2016-10-03 ENCOUNTER — Encounter (HOSPITAL_COMMUNITY): Payer: Self-pay | Admitting: *Deleted

## 2016-10-03 ENCOUNTER — Encounter (HOSPITAL_COMMUNITY)
Admission: RE | Disposition: A | Discharge status: Home / Self Care | Payer: Self-pay | Source: Ambulatory Visit | Attending: Neurosurgery

## 2016-10-03 ENCOUNTER — Ambulatory Visit (HOSPITAL_COMMUNITY): Payer: Medicare HMO | Admitting: Emergency Medicine

## 2016-10-03 ENCOUNTER — Ambulatory Visit (HOSPITAL_COMMUNITY): Payer: Medicare HMO | Admitting: Anesthesiology

## 2016-10-03 DIAGNOSIS — Z7982 Long term (current) use of aspirin: Secondary | ICD-10-CM | POA: Diagnosis not present

## 2016-10-03 DIAGNOSIS — Z791 Long term (current) use of non-steroidal anti-inflammatories (NSAID): Secondary | ICD-10-CM | POA: Diagnosis not present

## 2016-10-03 DIAGNOSIS — M4712 Other spondylosis with myelopathy, cervical region: Secondary | ICD-10-CM | POA: Diagnosis present

## 2016-10-03 DIAGNOSIS — Z90710 Acquired absence of both cervix and uterus: Secondary | ICD-10-CM | POA: Insufficient documentation

## 2016-10-03 DIAGNOSIS — Z9049 Acquired absence of other specified parts of digestive tract: Secondary | ICD-10-CM | POA: Diagnosis not present

## 2016-10-03 DIAGNOSIS — Z7984 Long term (current) use of oral hypoglycemic drugs: Secondary | ICD-10-CM | POA: Diagnosis not present

## 2016-10-03 DIAGNOSIS — Z419 Encounter for procedure for purposes other than remedying health state, unspecified: Secondary | ICD-10-CM

## 2016-10-03 DIAGNOSIS — M5001 Cervical disc disorder with myelopathy,  high cervical region: Secondary | ICD-10-CM | POA: Diagnosis present

## 2016-10-03 DIAGNOSIS — Z79899 Other long term (current) drug therapy: Secondary | ICD-10-CM | POA: Insufficient documentation

## 2016-10-03 HISTORY — PX: ANTERIOR CERVICAL DECOMP/DISCECTOMY FUSION: SHX1161

## 2016-10-03 LAB — POCT I-STAT 4, (NA,K, GLUC, HGB,HCT)
Glucose, Bld: 110 mg/dL — ABNORMAL HIGH (ref 65–99)
HEMATOCRIT: 27 % — AB (ref 36.0–46.0)
HEMOGLOBIN: 9.2 g/dL — AB (ref 12.0–15.0)
POTASSIUM: 3.9 mmol/L (ref 3.5–5.1)
Sodium: 146 mmol/L — ABNORMAL HIGH (ref 135–145)

## 2016-10-03 LAB — TYPE AND SCREEN
ABO/RH(D): A POS
Antibody Screen: NEGATIVE

## 2016-10-03 LAB — GLUCOSE, CAPILLARY
GLUCOSE-CAPILLARY: 118 mg/dL — AB (ref 65–99)
GLUCOSE-CAPILLARY: 141 mg/dL — AB (ref 65–99)
GLUCOSE-CAPILLARY: 140 mg/dL — AB (ref 65–99)
Glucose-Capillary: 108 mg/dL — ABNORMAL HIGH (ref 65–99)
Glucose-Capillary: 174 mg/dL — ABNORMAL HIGH (ref 65–99)

## 2016-10-03 SURGERY — ANTERIOR CERVICAL DECOMPRESSION/DISCECTOMY FUSION 1 LEVEL
Anesthesia: General | Site: Spine Cervical

## 2016-10-03 MED ORDER — PHENYLEPHRINE HCL 10 MG/ML IJ SOLN
INTRAMUSCULAR | Status: DC | PRN
  Administered 2016-10-03: 80 ug via INTRAVENOUS

## 2016-10-03 MED ORDER — DEXAMETHASONE 4 MG PO TABS
4.0000 mg | ORAL_TABLET | Freq: Four times a day (QID) | ORAL | Status: AC
  Administered 2016-10-03 – 2016-10-04 (×3): 4 mg via ORAL
  Filled 2016-10-03 (×3): qty 1

## 2016-10-03 MED ORDER — ACETAMINOPHEN 325 MG PO TABS
650.0000 mg | ORAL_TABLET | ORAL | Status: DC | PRN
  Administered 2016-10-03: 650 mg via ORAL
  Filled 2016-10-03: qty 2

## 2016-10-03 MED ORDER — ONDANSETRON HCL 4 MG/2ML IJ SOLN
INTRAMUSCULAR | Status: DC | PRN
  Administered 2016-10-03: 4 mg via INTRAVENOUS

## 2016-10-03 MED ORDER — FUROSEMIDE 40 MG PO TABS
40.0000 mg | ORAL_TABLET | Freq: Every day | ORAL | Status: DC
  Administered 2016-10-03 – 2016-10-04 (×2): 40 mg via ORAL
  Filled 2016-10-03 (×2): qty 1

## 2016-10-03 MED ORDER — FENTANYL CITRATE (PF) 100 MCG/2ML IJ SOLN
INTRAMUSCULAR | Status: DC | PRN
  Administered 2016-10-03: 100 ug via INTRAVENOUS

## 2016-10-03 MED ORDER — MONTELUKAST SODIUM 10 MG PO TABS
10.0000 mg | ORAL_TABLET | Freq: Every day | ORAL | Status: DC
  Administered 2016-10-04: 10 mg via ORAL
  Filled 2016-10-03: qty 1

## 2016-10-03 MED ORDER — POTASSIUM CHLORIDE IN NACL 20-0.9 MEQ/L-% IV SOLN
INTRAVENOUS | Status: DC
  Filled 2016-10-03: qty 1000

## 2016-10-03 MED ORDER — LIDOCAINE-EPINEPHRINE (PF) 2 %-1:200000 IJ SOLN
INTRAMUSCULAR | Status: AC
  Filled 2016-10-03: qty 20

## 2016-10-03 MED ORDER — SENNOSIDES-DOCUSATE SODIUM 8.6-50 MG PO TABS: 1.0000 | ORAL_TABLET | Freq: Every evening | ORAL | Status: DC | PRN

## 2016-10-03 MED ORDER — FENTANYL CITRATE (PF) 100 MCG/2ML IJ SOLN
25.0000 ug | INTRAMUSCULAR | Status: DC | PRN
  Administered 2016-10-03: 50 ug via INTRAVENOUS

## 2016-10-03 MED ORDER — THROMBIN 5000 UNITS EX SOLR
CUTANEOUS | Status: DC | PRN
  Administered 2016-10-03 (×2): 5000 [IU] via TOPICAL

## 2016-10-03 MED ORDER — LIDOCAINE-EPINEPHRINE 0.5 %-1:200000 IJ SOLN
INTRAMUSCULAR | Status: DC | PRN
  Administered 2016-10-03: 2 mL

## 2016-10-03 MED ORDER — SODIUM CHLORIDE 0.9 % IV SOLN: 250.0000 mL | INTRAVENOUS | Status: DC

## 2016-10-03 MED ORDER — CYCLOBENZAPRINE HCL 5 MG PO TABS: 5.0000 mg | ORAL_TABLET | Freq: Three times a day (TID) | ORAL | 0 refills | Status: DC | PRN

## 2016-10-03 MED ORDER — INSULIN ASPART 100 UNIT/ML ~~LOC~~ SOLN
0.0000 [IU] | Freq: Three times a day (TID) | SUBCUTANEOUS | Status: DC
  Administered 2016-10-03 – 2016-10-04 (×2): 2 [IU] via SUBCUTANEOUS

## 2016-10-03 MED ORDER — CHLORHEXIDINE GLUCONATE CLOTH 2 % EX PADS: 6.0000 | MEDICATED_PAD | Freq: Once | CUTANEOUS | Status: DC

## 2016-10-03 MED ORDER — LEVOTHYROXINE SODIUM 75 MCG PO TABS
150.0000 ug | ORAL_TABLET | Freq: Every day | ORAL | Status: DC
  Administered 2016-10-04: 150 ug via ORAL
  Filled 2016-10-03: qty 2

## 2016-10-03 MED ORDER — FENTANYL CITRATE (PF) 250 MCG/5ML IJ SOLN
INTRAMUSCULAR | Status: AC
  Filled 2016-10-03: qty 5

## 2016-10-03 MED ORDER — CEFAZOLIN SODIUM-DEXTROSE 2-4 GM/100ML-% IV SOLN
INTRAVENOUS | Status: AC
  Filled 2016-10-03: qty 100

## 2016-10-03 MED ORDER — ONDANSETRON HCL 4 MG PO TABS: 4.0000 mg | ORAL_TABLET | Freq: Four times a day (QID) | ORAL | Status: DC | PRN

## 2016-10-03 MED ORDER — BISACODYL 10 MG RE SUPP: 10.0000 mg | Freq: Every day | RECTAL | Status: DC | PRN

## 2016-10-03 MED ORDER — LEVOCETIRIZINE DIHYDROCHLORIDE 5 MG PO TABS: 5.0000 mg | ORAL_TABLET | Freq: Every evening | ORAL | Status: DC

## 2016-10-03 MED ORDER — PROPOFOL 10 MG/ML IV BOLUS
INTRAVENOUS | Status: AC
  Filled 2016-10-03: qty 40

## 2016-10-03 MED ORDER — METFORMIN HCL 500 MG PO TABS
500.0000 mg | ORAL_TABLET | Freq: Every day | ORAL | Status: DC
  Administered 2016-10-03 – 2016-10-04 (×2): 500 mg via ORAL
  Filled 2016-10-03 (×2): qty 1

## 2016-10-03 MED ORDER — ROCURONIUM BROMIDE 100 MG/10ML IV SOLN
INTRAVENOUS | Status: DC | PRN
  Administered 2016-10-03: 50 mg via INTRAVENOUS
  Administered 2016-10-03 (×2): 10 mg via INTRAVENOUS

## 2016-10-03 MED ORDER — MOMETASONE FURO-FORMOTEROL FUM 200-5 MCG/ACT IN AERO
2.0000 | INHALATION_SPRAY | Freq: Two times a day (BID) | RESPIRATORY_TRACT | Status: DC
  Administered 2016-10-03 – 2016-10-04 (×2): 2 via RESPIRATORY_TRACT
  Filled 2016-10-03: qty 8.8

## 2016-10-03 MED ORDER — ONDANSETRON HCL 4 MG/2ML IJ SOLN: 4.0000 mg | Freq: Four times a day (QID) | INTRAMUSCULAR | Status: DC | PRN

## 2016-10-03 MED ORDER — PANTOPRAZOLE SODIUM 40 MG PO TBEC
80.0000 mg | DELAYED_RELEASE_TABLET | Freq: Every day | ORAL | Status: DC
  Administered 2016-10-03 – 2016-10-04 (×2): 80 mg via ORAL
  Filled 2016-10-03 (×2): qty 2

## 2016-10-03 MED ORDER — PHENYLEPHRINE HCL 10 MG/ML IJ SOLN
INTRAMUSCULAR | Status: DC | PRN
  Administered 2016-10-03: 15 ug/min via INTRAVENOUS

## 2016-10-03 MED ORDER — CELECOXIB 200 MG PO CAPS: 200.0000 mg | ORAL_CAPSULE | Freq: Two times a day (BID) | ORAL | Status: DC

## 2016-10-03 MED ORDER — METOPROLOL TARTRATE 12.5 MG HALF TABLET
12.5000 mg | ORAL_TABLET | ORAL | Status: AC
  Administered 2016-10-03: 12.5 mg via ORAL
  Filled 2016-10-03 (×2): qty 1

## 2016-10-03 MED ORDER — LORATADINE 10 MG PO TABS
10.0000 mg | ORAL_TABLET | Freq: Every day | ORAL | Status: DC
  Administered 2016-10-03 – 2016-10-04 (×2): 10 mg via ORAL
  Filled 2016-10-03 (×2): qty 1

## 2016-10-03 MED ORDER — PHENOL 1.4 % MT LIQD
1.0000 | OROMUCOSAL | Status: DC | PRN
Start: 2016-10-03 — End: 2016-10-04
  Filled 2016-10-03: qty 177

## 2016-10-03 MED ORDER — LACTATED RINGERS IV SOLN
INTRAVENOUS | Status: DC
  Administered 2016-10-03: 07:00:00 via INTRAVENOUS

## 2016-10-03 MED ORDER — LACTATED RINGERS IV SOLN
INTRAVENOUS | Status: DC | PRN
  Administered 2016-10-03 (×2): via INTRAVENOUS

## 2016-10-03 MED ORDER — ZOLPIDEM TARTRATE 5 MG PO TABS
5.0000 mg | ORAL_TABLET | Freq: Every evening | ORAL | Status: DC | PRN
  Administered 2016-10-03: 5 mg via ORAL
  Filled 2016-10-03: qty 1

## 2016-10-03 MED ORDER — ONDANSETRON HCL 4 MG/2ML IJ SOLN: 4.0000 mg | Freq: Once | INTRAMUSCULAR | Status: DC | PRN

## 2016-10-03 MED ORDER — SODIUM CHLORIDE 0.9% FLUSH
3.0000 mL | Freq: Two times a day (BID) | INTRAVENOUS | Status: DC
  Administered 2016-10-03 – 2016-10-04 (×2): 3 mL via INTRAVENOUS

## 2016-10-03 MED ORDER — ACETAMINOPHEN 650 MG RE SUPP: 650.0000 mg | RECTAL | Status: DC | PRN

## 2016-10-03 MED ORDER — POTASSIUM CHLORIDE CRYS ER 20 MEQ PO TBCR
20.0000 meq | EXTENDED_RELEASE_TABLET | Freq: Two times a day (BID) | ORAL | Status: DC
  Administered 2016-10-03 – 2016-10-04 (×3): 20 meq via ORAL
  Filled 2016-10-03 (×3): qty 1

## 2016-10-03 MED ORDER — DEXAMETHASONE SODIUM PHOSPHATE 4 MG/ML IJ SOLN: 4.0000 mg | Freq: Four times a day (QID) | INTRAMUSCULAR | Status: AC

## 2016-10-03 MED ORDER — SUGAMMADEX SODIUM 200 MG/2ML IV SOLN
INTRAVENOUS | Status: DC | PRN
  Administered 2016-10-03: 188.6 mg via INTRAVENOUS

## 2016-10-03 MED ORDER — CEFAZOLIN SODIUM-DEXTROSE 2-4 GM/100ML-% IV SOLN
2.0000 g | INTRAVENOUS | Status: AC
  Administered 2016-10-03: 2 g via INTRAVENOUS

## 2016-10-03 MED ORDER — MIDAZOLAM HCL 2 MG/2ML IJ SOLN
INTRAMUSCULAR | Status: AC
  Filled 2016-10-03: qty 2

## 2016-10-03 MED ORDER — HEMOSTATIC AGENTS (NO CHARGE) OPTIME
TOPICAL | Status: DC | PRN
  Administered 2016-10-03: 1 via TOPICAL

## 2016-10-03 MED ORDER — FERROUS GLUCONATE 324 (38 FE) MG PO TABS
324.0000 mg | ORAL_TABLET | Freq: Three times a day (TID) | ORAL | Status: DC
  Administered 2016-10-04: 324 mg via ORAL
  Filled 2016-10-03 (×3): qty 1

## 2016-10-03 MED ORDER — CHLORHEXIDINE GLUCONATE CLOTH 2 % EX PADS
6.0000 | MEDICATED_PAD | Freq: Once | CUTANEOUS | Status: DC
Start: 2016-10-03 — End: 2016-10-03

## 2016-10-03 MED ORDER — LIDOCAINE HCL (CARDIAC) 20 MG/ML IV SOLN
INTRAVENOUS | Status: DC | PRN
  Administered 2016-10-03: 30 mg via INTRAVENOUS

## 2016-10-03 MED ORDER — FENTANYL CITRATE (PF) 100 MCG/2ML IJ SOLN
INTRAMUSCULAR | Status: AC
  Administered 2016-10-03: 50 ug
  Filled 2016-10-03: qty 2

## 2016-10-03 MED ORDER — AZELASTINE HCL 0.1 % NA SOLN: 2.0000 | Freq: Every day | NASAL | Status: DC | PRN

## 2016-10-03 MED ORDER — HYDROCODONE-ACETAMINOPHEN 5-325 MG PO TABS
1.0000 | ORAL_TABLET | ORAL | Status: DC | PRN
  Administered 2016-10-03: 2 via ORAL
  Administered 2016-10-03: 1 via ORAL
  Administered 2016-10-04: 2 via ORAL
  Filled 2016-10-03: qty 2
  Filled 2016-10-03: qty 1
  Filled 2016-10-03: qty 2

## 2016-10-03 MED ORDER — PROPOFOL 10 MG/ML IV BOLUS
INTRAVENOUS | Status: DC | PRN
  Administered 2016-10-03: 140 mg via INTRAVENOUS

## 2016-10-03 MED ORDER — NITROGLYCERIN 0.4 MG SL SUBL: 0.4000 mg | SUBLINGUAL_TABLET | SUBLINGUAL | Status: DC | PRN

## 2016-10-03 MED ORDER — DOCUSATE SODIUM 100 MG PO CAPS
100.0000 mg | ORAL_CAPSULE | Freq: Two times a day (BID) | ORAL | Status: DC
  Administered 2016-10-03 – 2016-10-04 (×2): 100 mg via ORAL
  Filled 2016-10-03 (×2): qty 1

## 2016-10-03 MED ORDER — SODIUM CHLORIDE 0.9% FLUSH: 3.0000 mL | INTRAVENOUS | Status: DC | PRN

## 2016-10-03 MED ORDER — THROMBIN 5000 UNITS EX SOLR
CUTANEOUS | Status: AC
  Filled 2016-10-03: qty 10000

## 2016-10-03 MED ORDER — MAGNESIUM CITRATE PO SOLN: 1.0000 | Freq: Once | ORAL | Status: DC | PRN

## 2016-10-03 MED ORDER — DEXAMETHASONE SODIUM PHOSPHATE 10 MG/ML IJ SOLN
INTRAMUSCULAR | Status: DC | PRN
  Administered 2016-10-03: 10 mg via INTRAVENOUS

## 2016-10-03 MED ORDER — MENTHOL 3 MG MT LOZG
1.0000 | LOZENGE | OROMUCOSAL | Status: DC | PRN
  Filled 2016-10-03: qty 9

## 2016-10-03 MED ORDER — METOPROLOL TARTRATE 12.5 MG HALF TABLET
12.5000 mg | ORAL_TABLET | Freq: Two times a day (BID) | ORAL | Status: DC
  Administered 2016-10-03 – 2016-10-04 (×2): 12.5 mg via ORAL
  Filled 2016-10-03 (×2): qty 1

## 2016-10-03 MED ORDER — 0.9 % SODIUM CHLORIDE (POUR BTL) OPTIME
TOPICAL | Status: DC | PRN
  Administered 2016-10-03: 1000 mL

## 2016-10-03 MED ORDER — HYDROCODONE-ACETAMINOPHEN 5-325 MG PO TABS: 1.0000 | ORAL_TABLET | Freq: Four times a day (QID) | ORAL | 0 refills | Status: DC | PRN

## 2016-10-03 MED ORDER — PRAVASTATIN SODIUM 40 MG PO TABS
40.0000 mg | ORAL_TABLET | Freq: Every day | ORAL | Status: DC
  Administered 2016-10-03: 40 mg via ORAL
  Filled 2016-10-03: qty 1

## 2016-10-03 SURGICAL SUPPLY — 78 items
ADH SKN CLS APL DERMABOND .7 (GAUZE/BANDAGES/DRESSINGS) ×1
BIT DRILL 14X3 FLUT 2XNS (BIT) IMPLANT
BIT DRL 14X3 FLUT 2XNS (BIT) ×1
BLADE CLIPPER SURG (BLADE) IMPLANT
BNDG GAUZE ELAST 4 BULKY (GAUZE/BANDAGES/DRESSINGS) IMPLANT
BUR DRUM 4.0 (BURR) ×1 IMPLANT
BUR MATCHSTICK NEURO 3.0 LAGG (BURR) ×2 IMPLANT
CANISTER SUCT 3000ML PPV (MISCELLANEOUS) ×2 IMPLANT
CARTRIDGE OIL MAESTRO DRILL (MISCELLANEOUS) ×1 IMPLANT
DECANTER SPIKE VIAL GLASS SM (MISCELLANEOUS) ×2 IMPLANT
DERMABOND ADVANCED (GAUZE/BANDAGES/DRESSINGS) ×1
DERMABOND ADVANCED .7 DNX12 (GAUZE/BANDAGES/DRESSINGS) ×1 IMPLANT
DIFFUSER DRILL AIR PNEUMATIC (MISCELLANEOUS) ×2 IMPLANT
DRAPE HALF SHEET 40X57 (DRAPES) ×2 IMPLANT
DRAPE LAPAROTOMY 100X72 PEDS (DRAPES) ×2 IMPLANT
DRAPE MICROSCOPE LEICA (MISCELLANEOUS) ×2 IMPLANT
DRAPE POUCH INSTRU U-SHP 10X18 (DRAPES) ×2 IMPLANT
DRILL BIT 14MM (BIT) ×2
DURAPREP 6ML APPLICATOR 50/CS (WOUND CARE) ×2 IMPLANT
ELECT COATED BLADE 2.86 ST (ELECTRODE) ×2 IMPLANT
ELECT REM PT RETURN 9FT ADLT (ELECTROSURGICAL) ×2
ELECTRODE REM PT RTRN 9FT ADLT (ELECTROSURGICAL) ×1 IMPLANT
GAUZE SPONGE 4X4 16PLY XRAY LF (GAUZE/BANDAGES/DRESSINGS) IMPLANT
GLOVE BIO SURGEON STRL SZ 6.5 (GLOVE) IMPLANT
GLOVE BIO SURGEON STRL SZ7 (GLOVE) IMPLANT
GLOVE BIO SURGEON STRL SZ7.5 (GLOVE) IMPLANT
GLOVE BIO SURGEON STRL SZ8 (GLOVE) ×1 IMPLANT
GLOVE BIO SURGEON STRL SZ8.5 (GLOVE) ×1 IMPLANT
GLOVE BIOGEL M 8.0 STRL (GLOVE) IMPLANT
GLOVE BIOGEL PI IND STRL 7.0 (GLOVE) IMPLANT
GLOVE BIOGEL PI INDICATOR 7.0 (GLOVE) ×1
GLOVE ECLIPSE 6.5 STRL STRAW (GLOVE) ×2 IMPLANT
GLOVE ECLIPSE 7.0 STRL STRAW (GLOVE) IMPLANT
GLOVE ECLIPSE 7.5 STRL STRAW (GLOVE) IMPLANT
GLOVE ECLIPSE 8.0 STRL XLNG CF (GLOVE) IMPLANT
GLOVE ECLIPSE 8.5 STRL (GLOVE) IMPLANT
GLOVE EXAM NITRILE LRG STRL (GLOVE) IMPLANT
GLOVE EXAM NITRILE XL STR (GLOVE) IMPLANT
GLOVE EXAM NITRILE XS STR PU (GLOVE) IMPLANT
GLOVE INDICATOR 6.5 STRL GRN (GLOVE) IMPLANT
GLOVE INDICATOR 7.0 STRL GRN (GLOVE) IMPLANT
GLOVE INDICATOR 7.5 STRL GRN (GLOVE) IMPLANT
GLOVE INDICATOR 8.0 STRL GRN (GLOVE) IMPLANT
GLOVE INDICATOR 8.5 STRL (GLOVE) IMPLANT
GLOVE OPTIFIT SS 8.0 STRL (GLOVE) IMPLANT
GLOVE SURG SS PI 6.5 STRL IVOR (GLOVE) IMPLANT
GLOVE SURG SS PI 7.5 STRL IVOR (GLOVE) ×2 IMPLANT
GOWN STRL REUS W/ TWL LRG LVL3 (GOWN DISPOSABLE) ×2 IMPLANT
GOWN STRL REUS W/ TWL XL LVL3 (GOWN DISPOSABLE) IMPLANT
GOWN STRL REUS W/TWL 2XL LVL3 (GOWN DISPOSABLE) IMPLANT
GOWN STRL REUS W/TWL LRG LVL3 (GOWN DISPOSABLE) ×4
GOWN STRL REUS W/TWL XL LVL3 (GOWN DISPOSABLE)
IMPL ZERO-P 9MM LRD (Orthopedic Implant) IMPLANT
IMPLANT ZERO-P 9MM LRD (Orthopedic Implant) ×2 IMPLANT
KIT BASIN OR (CUSTOM PROCEDURE TRAY) ×2 IMPLANT
KIT ROOM TURNOVER OR (KITS) ×2 IMPLANT
NDL HYPO 25X1 1.5 SAFETY (NEEDLE) ×1 IMPLANT
NDL SPNL 22GX3.5 QUINCKE BK (NEEDLE) ×1 IMPLANT
NEEDLE HYPO 25X1 1.5 SAFETY (NEEDLE) ×2 IMPLANT
NEEDLE SPNL 22GX3.5 QUINCKE BK (NEEDLE) ×2 IMPLANT
NS IRRIG 1000ML POUR BTL (IV SOLUTION) ×2 IMPLANT
OIL CARTRIDGE MAESTRO DRILL (MISCELLANEOUS) ×2
PACK LAMINECTOMY NEURO (CUSTOM PROCEDURE TRAY) ×2 IMPLANT
PAD ARMBOARD 7.5X6 YLW CONV (MISCELLANEOUS) ×6 IMPLANT
PIN DISTRACTION 14MM (PIN) ×4 IMPLANT
PUTTY KINEX BIOACTIVE 2CC (Bone Implant) ×1 IMPLANT
RUBBERBAND STERILE (MISCELLANEOUS) ×4 IMPLANT
SCREW 12MM TI CERV LOCK (Screw) ×1 IMPLANT
SCREW 14MM TI CERV LOCK (Screw) ×3 IMPLANT
SPONGE INTESTINAL PEANUT (DISPOSABLE) ×2 IMPLANT
SPONGE SURGIFOAM ABS GEL SZ50 (HEMOSTASIS) ×2 IMPLANT
SUT VIC AB 0 CT1 27 (SUTURE) ×2
SUT VIC AB 0 CT1 27XBRD ANTBC (SUTURE) ×1 IMPLANT
SUT VIC AB 3-0 SH 8-18 (SUTURE) ×3 IMPLANT
TOWEL GREEN STERILE (TOWEL DISPOSABLE) ×2 IMPLANT
TOWEL GREEN STERILE FF (TOWEL DISPOSABLE) ×2 IMPLANT
TUBE CONNECTING 12X1/4 (SUCTIONS) ×1 IMPLANT
WATER STERILE IRR 1000ML POUR (IV SOLUTION) ×2 IMPLANT

## 2016-10-03 NOTE — Progress Notes (Signed)
Patient complaining of headache. Attempted to administer po tylenol. Patient initially refused stating the only medication that will help is [employer]. Educated patient on inability to give home medications without MD consent, risk for bleeding post surgery and side effects of Goody Powder. Patient consented to take tylenol. Offered environmental changes and ice pack for patient's head. Patient adamantly refuses and will no longer speak to or make eye contact with nurse. Lacey Vega

## 2016-10-03 NOTE — Discharge Summary (Signed)
Physician Discharge Summary  Patient ID: Lacey Vega MRN: [id number] DOB/AGE: 1950-08-11 66 y.o.  Admit date: 10/03/2016 Discharge date: 10/03/2016  Admission Diagnoses:osteoarthritis cervical spine with myelopathy C3/4  Discharge Diagnoses:  Active Problems:   Osteoarthritis of cervical spine with myelopathy   Discharged Condition: good  Hospital Course: Lacey Vega was admitted and taken to the operating room for an uncomplicated ACDF at O2/7. Post op she is alert, oriented x 4, moving all extremities well. She is ambulating, voiding, and tolerating a regular diet. Her wound is clean, dry, and without signs of infection.   Treatments: surgery: ACDF C3/4, Zero-P  Discharge Exam: Blood pressure 138/68, pulse 61, temperature 97.8 F (36.6 C), temperature source Oral, resp. rate 20, height 5\' 5"  (1.651 m), weight 94.3 kg (208 lb), SpO2 100 %. General appearance: alert, cooperative, appears stated age and mild distress Voice is strong.  Disposition: 01-Home or Self Care HERNIATED NUCLEUS PULPOSUS WITH MYELOPATHY, CERVICAL REGION  Allergies as of 10/03/2016      Reactions   Oxycodone Shortness Of Breath      Medication List    STOP taking these medications   GOODY HEADACHE PO   naproxen 500 MG tablet Commonly known as:  NAPROSYN     TAKE these medications   aspirin EC 81 MG tablet Take 81 mg by mouth daily.   Azelastine HCl 0.15 % Soln Place 2 sprays into both nostrils daily as needed for rhinitis. Use in each nostril as directed   budesonide-formoterol 160-4.5 MCG/ACT inhaler Commonly known as:  SYMBICORT Inhale 2 puffs into the lungs 2 (two) times daily as needed (for shortness of breath).   cyclobenzaprine 5 MG tablet Commonly known as:  FLEXERIL Take 1 tablet (5 mg total) by mouth 3 (three) times daily as needed for muscle spasms.   ferrous gluconate 324 MG tablet Commonly known as:  FERGON Take 324 mg by mouth 3 (three) times daily with meals.    furosemide 40 MG tablet Commonly known as:  LASIX Take 40 mg by mouth daily.   HYDROcodone-acetaminophen 5-325 MG tablet Commonly known as:  NORCO/VICODIN Take 1 tablet by mouth every 6 (six) hours as needed for moderate pain.   ibuprofen 600 MG tablet Commonly known as:  ADVIL,MOTRIN Take 1 tablet (600 mg total) by mouth 3 (three) times daily with meals.   levocetirizine 5 MG tablet Commonly known as:  XYZAL Take 5 mg by mouth every evening.   levothyroxine 150 MCG tablet Commonly known as:  SYNTHROID, LEVOTHROID Take 150 mcg by mouth daily.   metFORMIN 500 MG tablet Commonly known as:  GLUCOPHAGE Take 500 mg by mouth daily.   metoprolol tartrate 25 MG tablet Commonly known as:  LOPRESSOR Take 12.5 mg by mouth 2 (two) times daily.   montelukast 10 MG tablet Commonly known as:  SINGULAIR Take 10 mg by mouth daily.   nitroGLYCERIN 0.4 MG SL tablet Commonly known as:  NITROSTAT Place 0.4 mg under the tongue every 5 (five) minutes as needed for chest pain.   omeprazole 40 MG capsule Commonly known as:  PRILOSEC Take 40 mg by mouth daily.   potassium chloride SA 20 MEQ tablet Commonly known as:  K-DUR,KLOR-CON Take 20 mEq by mouth 2 (two) times daily.   pravastatin 40 MG tablet Commonly known as:  PRAVACHOL Take 40 mg by mouth at bedtime.      Follow-up Information    Jakyrah Holladay L, MD Follow up in 3 week(s).   Specialty:  Neurosurgery Why:  please call the office to make an appointment Contact information: [address] [place] [address] [place] [address] [phone]           Signed: Winfield Cunas 10/03/2016, 6:31 PM

## 2016-10-03 NOTE — H&P (Signed)
BP (!) 141/71   Ht 5\' 5"  (1.651 m)   Wt 94.3 kg (208 lb)   BMI 34.61 kg/m  Ms. Daizy Outen is a 66 year old woman who presents today for evaluation of cervical spasticity. She has had difficulty with her balance, difficulty with her gait, has had to start using a cane. She has difficulty with coordination in her hands, and she just drops things. She has no good feeling for it. This has been ongoing now for the last few weeks. She says the stiffness as she describes has been progressing over the last 3 years. She is not sure what if anything has started this. There was no trauma. She says she feels a heaviness also in the lower back, weakness in her arms, leg and back, numbness and tingling in her arms, headaches, and she has bowel and bladder incontinence. She said no butt muscles, but it is also urine too. REVIEW OF SYSTEMS: Positive for hearing loss, tinnitus, balance problems, shortness of breath, bronchitis, hypercholesterolemia, swelling in the feet, leg pain, nausea, peptic ulcer disease, difficulty with urinary stream, arthritis, neck pain, arm weakness, leg weakness, back pain, arm pain, leg pain, joint pain, anemia, blurred vision, anxiety, problems with coordination in her upper extremities. She is right-handed. HABITS: She does not use alcohol. She does not have a history of illicit drug use. She does not smoke. PAST SURGICAL HISTORY: She had previous surgery from C4 to C6 by Dr. Karie Chimera sometime before 2007. She has also undergone a hysterectomy, cholecystectomy, and rotator cuff surgery. PHYSICAL EXAMINATION: She is 204.8 pounds. Temperature is 97.7, blood pressure is 138/74, pulse is 80, pain is 0/10. On exam, she has 5/5 strength in the upper extremities, very poor coordination. She is hyperreflexic, 3+ biceps, triceps, brachioradialis. Positive Hoffmann sign. Some finger flexion with brachioradialis, 1-2 beats of clonus bilaterally. She is hyperreflexic at the knees and  ankles. Coordination is poor. She has a spastic gait with her cane. Pupils equal, round, reactive to light. Full extraocular movements. Full visual fields. Hearing intact to voice. Symmetric facial movements and sensation.  MRI shows significant amount of pressure on the spinal cord at C3-4, both anterior and posterior, but most of the pressure is anterior. 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 I just looked at some plain x-rays from 2007 to see the plate system. There seems to be enough room for Zero-P device which is made by [employer]. I would hope to be able to use that here and not have to remove the plate that is already present. Risks and benefits of bleeding, infection, no relief, need for further surgery, fusion failure, hardware failure, spinal cord damage, paralysis, weakness in one of both forearms, one of both legs, bowel and bladder incontinence, and other risks were explained. She understands and wishes to proceed.

## 2016-10-03 NOTE — Op Note (Signed)
10/03/2016  12:46 PM  PATIENT:  Lacey Vega  66 y.o. female with a large disc and osteophyte at C3/4 causing severe spinal cord stenosis. She is admitted and taken to the operating room for decompression of the spinal canal and spinal cord.   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  POST-OPERATIVE DIAGNOSIS:  HERNIATED NUCLEUS PULPOSUS WITH Myelopathy C3/4 PROCEDURE:  Anterior Cervical decompression C3/4 Arthrodesis C3/4 with 80mm Peek implant(Zero-P) filled with allograft morsels Anterior instrumentation(Zero P) C3/4  SURGEON:   Surgeon(s): Ashok Pall, MD Newman Pies, MD   ASSISTANTS:Jenkins, Dellis Filbert  ANESTHESIA:   general  EBL:  Total I/O In: 1850 [I.V.:1850] Out: 71 [Blood:50]  BLOOD ADMINISTERED:none  CELL SAVER GIVEN:none  COUNT:per nursing  DRAINS: none   SPECIMEN:  No Specimen  DICTATION: Lacey Vega was taken to the operating room, intubated, and placed under general anesthesia without difficulty. She was positioned supine with her head in slight extension on a horseshoe headrest. The neck was prepped and draped in a sterile manner. I infiltrated 3 cc's 1/2%lidocaine/1:200,000 strength epinephrine into the planned incision starting from the midline to the medial border of the left sternocleidomastoid muscle. I opened the incision with a 10 blade and dissected sharply through soft tissue to the platysma. I dissected in the plane superior to the platysma both rostrally and caudally. I then opened the platysma in a horizontal fashion with Metzenbaum scissors, and dissected in the inferior plane rostrally and caudally. With both blunt and sharp technique I created an avascular corridor to the cervical spine. I placed a spinal needle(s) in the disc space at C3/4 . I then reflected the longus colli from C3 to C4 and placed self retaining retractors. I opened the disc space(s) at 3/4 with a 15 blade. I removed disc with  curettes, Kerrison punches, and the drill. Using the drill I removed osteophytes and prepared for the decompression.  I decompressed the spinal canal and the C4 root(s) with the drill, Kerrison punches, and the curettes. I used the microscope to aid in microdissection. I removed the posterior longitudinal ligament to fully expose and decompress the thecal sac. I exposed the roots laterally taking down the  uncovertebral joints. With the decompression complete we moved on to the arthrodesis. I used the drill to level the surfaces of C3, and 4. I removed soft tissue to prepare the disc space and the bony surfaces. I measured the space and placed a 33mm Peek zero-p implant into the disc space filled with allograft morsels.  We then placed the anterior instrumentation. I placed 2 screws in each vertebral body through the plate. I locked the screws into place. Intraoperative xray showed the implant, plate, and screws to be in good position. I irrigated the wound, achieved hemostasis, and closed the wound in layers. I approximated the platysma, and the subcuticular plane with vicryl sutures. I used Dermabond for a sterile dressing.   PLAN OF CARE: Admit for overnight observation  PATIENT DISPOSITION:  PACU - hemodynamically stable.   Delay start of Pharmacological VTE agent (>24hrs) due to surgical blood loss or risk of bleeding:  yes

## 2016-10-03 NOTE — Anesthesia Preprocedure Evaluation (Addendum)
Anesthesia Evaluation  Patient identified by MRN, date of birth, ID band Patient awake    Reviewed: Allergy & Precautions, NPO status , Patient's Chart, lab work & pertinent test results  Airway Mallampati: II  TM Distance: >3 FB Neck ROM: Full    Dental  (+) Edentulous Upper   Pulmonary former smoker,    breath sounds clear to auscultation       Cardiovascular hypertension,  Rhythm:Regular Rate:Normal     Neuro/Psych    GI/Hepatic   Endo/Other  diabetes  Renal/GU      Musculoskeletal   Abdominal   Peds  Hematology   Anesthesia Other Findings   Reproductive/Obstetrics                            Anesthesia Physical Anesthesia Plan  ASA: III  Anesthesia Plan: General   Post-op Pain Management:    Induction: Intravenous  Airway Management Planned: Oral ETT  Additional Equipment:   Intra-op Plan:   Post-operative Plan: Extubation in OR  Informed Consent: I have reviewed the patients History and Physical, chart, labs and discussed the procedure including the risks, benefits and alternatives for the proposed anesthesia with the patient or authorized representative who has indicated his/her understanding and acceptance.   Dental advisory given  Plan Discussed with: CRNA and Anesthesiologist  Anesthesia Plan Comments:         Anesthesia Quick Evaluation

## 2016-10-03 NOTE — Anesthesia Postprocedure Evaluation (Signed)
Anesthesia Post Note  Patient: Lacey Vega  Procedure(s) Performed: Procedure(s) (LRB): ANTERIOR CERVICAL DECOMPRESSION/DISCECTOMY FUSION CERVICAL THREE- CERVICAL FOUR (N/A)  Patient location during evaluation: PACU Anesthesia Type: General Level of consciousness: awake, awake and alert and oriented Pain management: pain level controlled Vital Signs Assessment: post-procedure vital signs reviewed and stable Respiratory status: spontaneous breathing, nonlabored ventilation and respiratory function stable Cardiovascular status: blood pressure returned to baseline Anesthetic complications: no       Last Vitals:  Vitals:   10/03/16 1300 10/03/16 1350  BP:  132/62  Pulse: 68 65  Resp: 16 20  Temp:  36.5 C    Last Pain:  Vitals:   10/03/16 1350  TempSrc: Oral  PainSc: 4                  Athony Coppa COKER

## 2016-10-03 NOTE — Transfer of Care (Signed)
Immediate Anesthesia Transfer of Care Note  Patient: Lacey Vega  Procedure(s) Performed: Procedure(s) with comments: ANTERIOR CERVICAL DECOMPRESSION/DISCECTOMY FUSION CERVICAL THREE- CERVICAL FOUR (N/A) - Right side approach  Patient Location: PACU  Anesthesia Type:General  Level of Consciousness: awake  Airway & Oxygen Therapy: Patient Spontanous Breathing and Patient connected to nasal cannula oxygen  Post-op Assessment: Report given to RN and Post -op Vital signs reviewed and stable  Post vital signs: Reviewed and stable  Last Vitals:  Vitals:   10/03/16 0727  BP: (!) 141/71    Last Pain: There were no vitals filed for this visit.       Complications: No apparent anesthesia complications

## 2016-10-03 NOTE — Discharge Instructions (Signed)

## 2016-10-03 NOTE — Anesthesia Procedure Notes (Signed)
Procedure Name: Intubation Date/Time: 10/03/2016 9:36 AM Performed by: Eligha Bridegroom Pre-anesthesia Checklist: Patient identified, Emergency Drugs available, Suction available and Patient being monitored Patient Re-evaluated:Patient Re-evaluated prior to inductionOxygen Delivery Method: Circle system utilized Preoxygenation: Pre-oxygenation with 100% oxygen Ventilation: Oral airway inserted - appropriate to patient size Laryngoscope Size: Mac and 3 Grade View: Grade I Tube type: Oral Tube size: 7.5 mm Number of attempts: 1 Airway Equipment and Method: Stylet Placement Confirmation: ETT inserted through vocal cords under direct vision,  positive ETCO2 and breath sounds checked- equal and bilateral Secured at: 21 cm Tube secured with: Tape Dental Injury: Teeth and Oropharynx as per pre-operative assessment

## 2016-10-04 DIAGNOSIS — M5001 Cervical disc disorder with myelopathy,  high cervical region: Secondary | ICD-10-CM | POA: Diagnosis not present

## 2016-10-04 LAB — GLUCOSE, CAPILLARY: Glucose-Capillary: 144 mg/dL — ABNORMAL HIGH (ref 65–99)

## 2016-10-04 NOTE — Progress Notes (Signed)
Ready for home, understands d/c instr. Daughter, Sharyn Lull taking her home.

## 2016-10-04 NOTE — Discharge Summary (Signed)
Physician Discharge Summary  Patient ID: Lacey Vega MRN: [id number] DOB/AGE: 1951/04/24 66 y.o.  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 Cyndy Freeze performed a C3-4 anterior cervical discectomy with instrumentation on the patient on 10/03/2016.  The patient's postoperative course was unremarkable. On postoperative day #1 patient requested discharge home. The patient, her daughter, were given written and oral discharge instructions. All her questions were answered. She requested a bedside commode.  Consults: None Significant Diagnostic Studies: None Treatments: C 3/4 anterior cervical discectomy and instrumentation Discharge Exam: Blood pressure 119/68, pulse (!) 59, temperature 98.1 F (36.7 C), temperature source Oral, resp. rate 20, height 5\' 5"  (1.651 m), weight 94.3 kg (208 lb), SpO2 95 %. The patient is alert and pleasant. She is in no apparent distress. Her wound is unremarkable. There is no hematoma or shift. She is moving all 4 extremities well.  Disposition: Home   Allergies as of 10/04/2016      Reactions   Oxycodone Shortness Of Breath      Medication List    STOP taking these medications   GOODY HEADACHE PO   naproxen 500 MG tablet Commonly known as:  NAPROSYN     TAKE these medications   aspirin EC 81 MG tablet Take 81 mg by mouth daily.   Azelastine HCl 0.15 % Soln Place 2 sprays into both nostrils daily as needed for rhinitis. Use in each nostril as directed   budesonide-formoterol 160-4.5 MCG/ACT inhaler Commonly known as:  SYMBICORT Inhale 2 puffs into the lungs 2 (two) times daily as needed (for shortness of breath).   cyclobenzaprine 5 MG tablet Commonly known as:  FLEXERIL Take 1 tablet (5 mg total)  by mouth 3 (three) times daily as needed for muscle spasms.   ferrous gluconate 324 MG tablet Commonly known as:  FERGON Take 324 mg by mouth 3 (three) times daily with meals.   furosemide 40 MG tablet Commonly known as:  LASIX Take 40 mg by mouth daily.   HYDROcodone-acetaminophen 5-325 MG tablet Commonly known as:  NORCO/VICODIN Take 1 tablet by mouth every 6 (six) hours as needed for moderate pain.   ibuprofen 600 MG tablet Commonly known as:  ADVIL,MOTRIN Take 1 tablet (600 mg total) by mouth 3 (three) times daily with meals.   levocetirizine 5 MG tablet Commonly known as:  XYZAL Take 5 mg by mouth every evening.   levothyroxine 150 MCG tablet Commonly known as:  SYNTHROID, LEVOTHROID Take 150 mcg by mouth daily.   metFORMIN 500 MG tablet Commonly known as:  GLUCOPHAGE Take 500 mg by mouth daily.   metoprolol tartrate 25 MG tablet Commonly known as:  LOPRESSOR Take 12.5 mg by mouth 2 (two) times daily.   montelukast 10 MG tablet Commonly known as:  SINGULAIR Take 10 mg by mouth daily.   nitroGLYCERIN 0.4 MG SL tablet Commonly known as:  NITROSTAT Place 0.4 mg under the tongue every 5 (five) minutes as needed for chest pain.   omeprazole 40 MG capsule Commonly known as:  PRILOSEC Take 40 mg by mouth daily.   potassium chloride SA 20 MEQ tablet Commonly known as:  K-DUR,KLOR-CON Take 20 mEq by mouth 2 (two) times daily.   pravastatin 40 MG tablet Commonly known as:  PRAVACHOL Take 40 mg by mouth at bedtime.  Follow-up Information    Ashok Pall, MD Follow up in 3 week(s).   Specialty:  Neurosurgery Why:  please call the office to make an appointment Contact information: [address] [place] [address] [phone]           Signed: [name] 10/04/2016, 9:11 AM

## 2016-10-06 ENCOUNTER — Encounter (HOSPITAL_COMMUNITY): Payer: Self-pay | Admitting: Neurosurgery

## 2016-10-06 LAB — TYPE AND SCREEN
ABO/RH(D): A POS
ANTIBODY SCREEN: NEGATIVE
DONOR AG TYPE: NEGATIVE
Donor AG Type: NEGATIVE
Unit division: 0
Unit division: 0

## 2016-10-06 LAB — BPAM RBC
BLOOD PRODUCT EXPIRATION DATE: 201805122359
Blood Product Expiration Date: 201805122359
UNIT TYPE AND RH: 6200
Unit Type and Rh: 6200

## 2016-10-20 ENCOUNTER — Encounter (HOSPITAL_COMMUNITY): Payer: Self-pay | Admitting: Neurosurgery

## 2016-11-19 ENCOUNTER — Other Ambulatory Visit: Payer: Self-pay | Admitting: Cardiology

## 2016-11-19 DIAGNOSIS — R079 Chest pain, unspecified: Secondary | ICD-10-CM

## 2016-12-08 ENCOUNTER — Encounter (HOSPITAL_COMMUNITY)
Admission: RE | Admit: 2016-12-08 | Discharge: 2016-12-08 | Disposition: A | Discharge status: Home / Self Care | Payer: Medicare HMO | Source: Ambulatory Visit | Attending: Cardiology | Admitting: Cardiology

## 2016-12-08 DIAGNOSIS — R079 Chest pain, unspecified: Secondary | ICD-10-CM | POA: Insufficient documentation

## 2016-12-08 MED ORDER — REGADENOSON 0.4 MG/5ML IV SOLN
0.4000 mg | Freq: Once | INTRAVENOUS | Status: AC
  Administered 2016-12-08: 0.4 mg via INTRAVENOUS

## 2016-12-08 MED ORDER — REGADENOSON 0.4 MG/5ML IV SOLN
INTRAVENOUS | Status: AC
  Filled 2016-12-08: qty 5

## 2016-12-08 MED ORDER — TECHNETIUM TC 99M TETROFOSMIN IV KIT
30.0000 | PACK | Freq: Once | INTRAVENOUS | Status: AC | PRN
  Administered 2016-12-08: 30 via INTRAVENOUS

## 2016-12-08 MED ORDER — TECHNETIUM TC 99M TETROFOSMIN IV KIT
10.0000 | PACK | Freq: Once | INTRAVENOUS | Status: AC | PRN
  Administered 2016-12-08: 10 via INTRAVENOUS

## 2017-03-17 IMAGING — CR DG CHEST 2V
2 series · 2 of 2 positions shown · non-contrast
Comparison: 03/12/2009

CLINICAL DATA: Shortness of breath for 1 month, chest tightness,
asthma, hypertension, diabetes, former smoker

EXAM:
CHEST  2 VIEW

[view not recorded (1 of 2)]
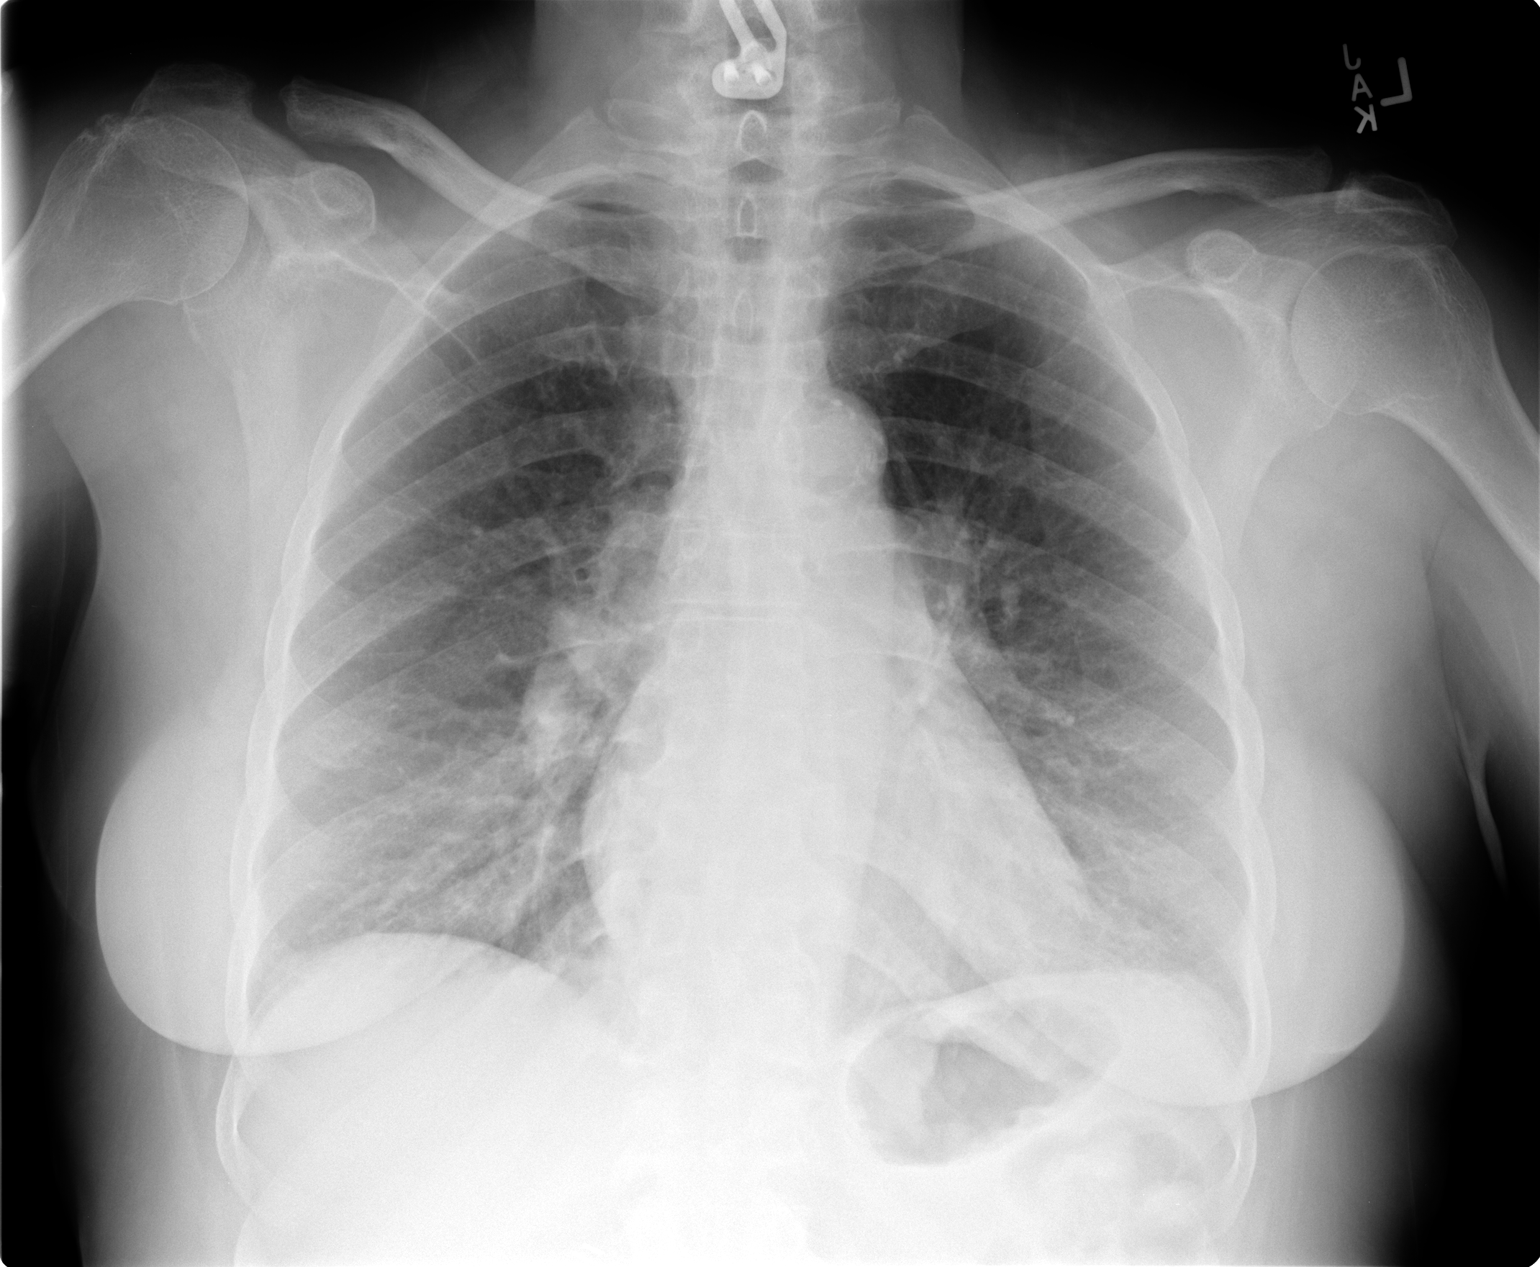

[view not recorded (2 of 2)]
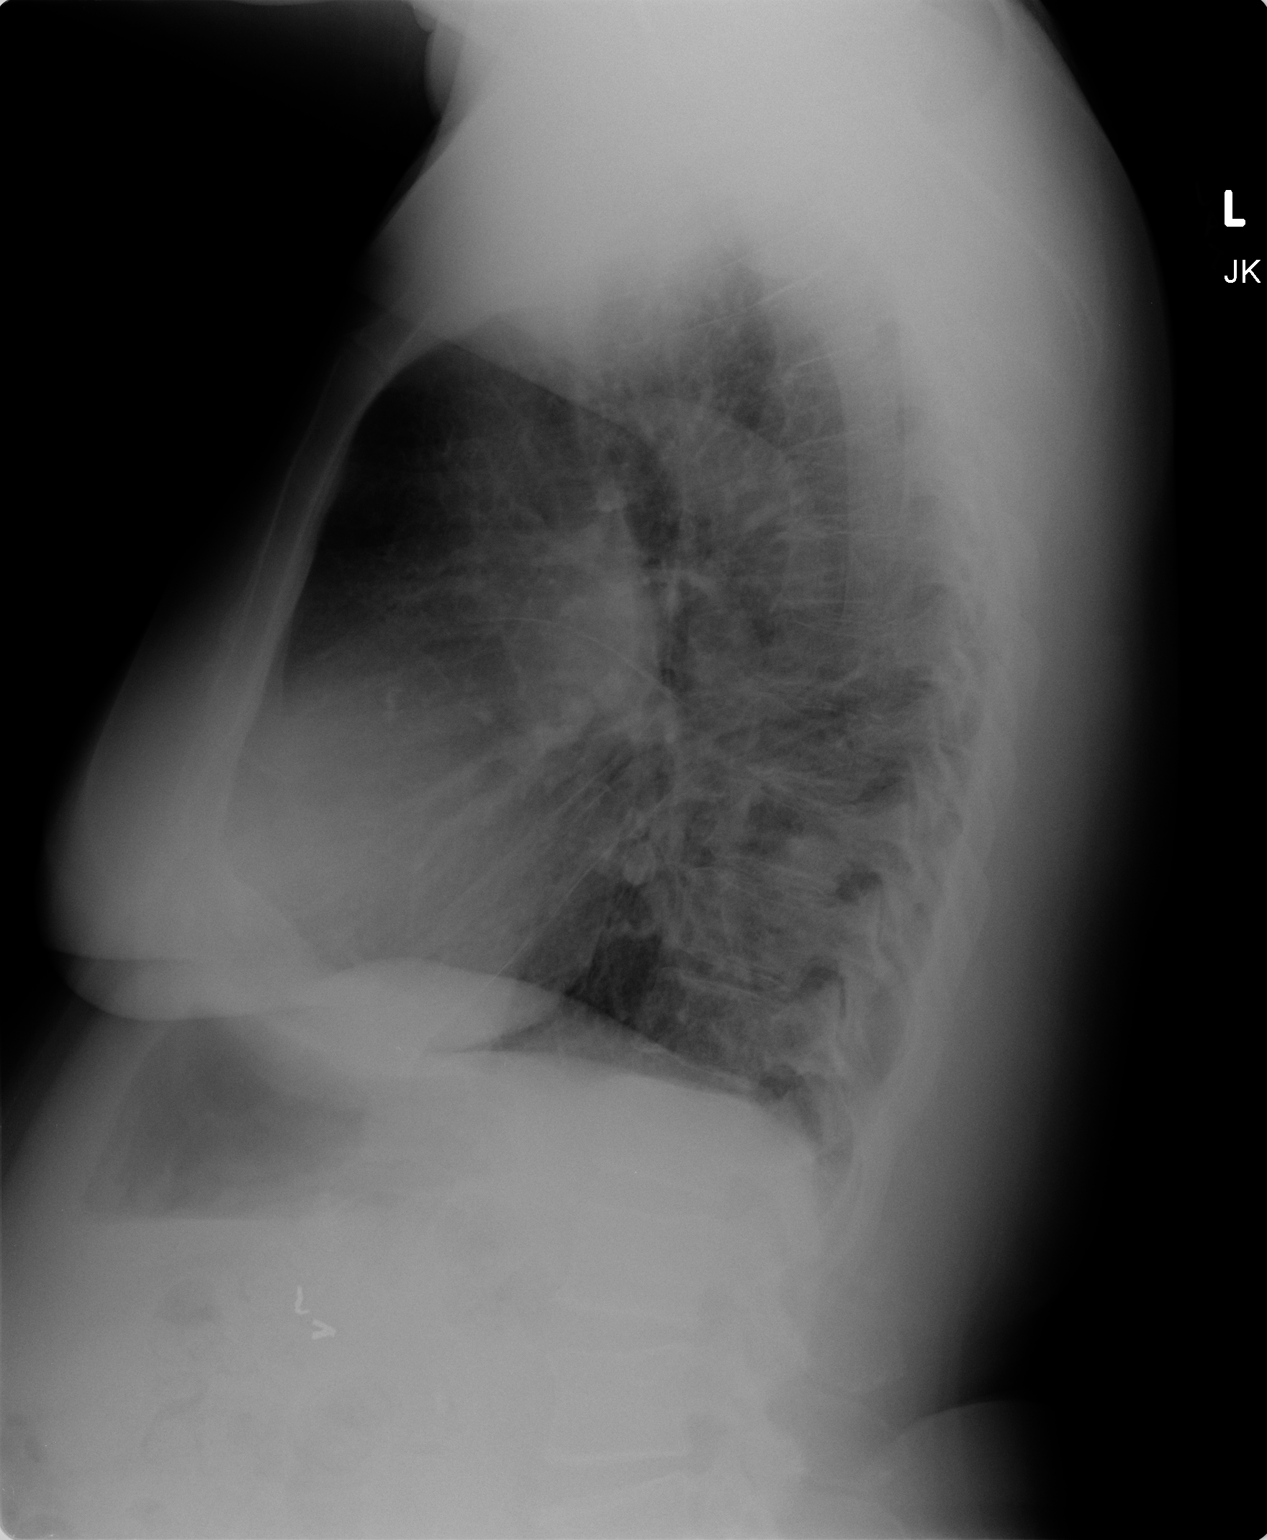

[2 of 2 positions shown; findings below may reference images not displayed]

FINDINGS: Upper normal size of cardiac silhouette.

Mediastinal contours and pulmonary vascularity normal.

Atherosclerotic calcification aorta.

Minimal peribronchial thickening.

No infiltrate, pleural effusion or pneumothorax.

Prior cervical spine fusion.

Osseous structures otherwise unremarkable.
IMPRESSION: Minimal chronic bronchitic changes.

No acute abnormalities.

## 2017-06-19 ENCOUNTER — Other Ambulatory Visit: Payer: Self-pay | Admitting: Gastroenterology

## 2017-07-09 ENCOUNTER — Encounter (HOSPITAL_COMMUNITY): Payer: Self-pay | Admitting: Neurosurgery

## 2017-07-13 ENCOUNTER — Encounter (HOSPITAL_COMMUNITY)
Admission: RE | Disposition: A | Discharge status: Home / Self Care | Payer: Self-pay | Source: Ambulatory Visit | Attending: Gastroenterology

## 2017-07-13 ENCOUNTER — Ambulatory Visit (HOSPITAL_COMMUNITY)
Admission: RE | Admit: 2017-07-13 | Discharge: 2017-07-13 | Disposition: A | Discharge status: Home / Self Care | Payer: Medicare HMO | Source: Ambulatory Visit | Attending: Gastroenterology | Admitting: Gastroenterology

## 2017-07-13 DIAGNOSIS — R110 Nausea: Secondary | ICD-10-CM | POA: Diagnosis not present

## 2017-07-13 DIAGNOSIS — R1310 Dysphagia, unspecified: Secondary | ICD-10-CM | POA: Insufficient documentation

## 2017-07-13 HISTORY — PX: ESOPHAGEAL MANOMETRY: SHX5429

## 2017-07-13 SURGERY — MANOMETRY, ESOPHAGUS

## 2017-07-13 MED ORDER — LIDOCAINE VISCOUS 2 % MT SOLN
OROMUCOSAL | Status: AC
  Filled 2017-07-13: qty 15

## 2017-07-13 SURGICAL SUPPLY — 2 items
FACESHIELD LNG OPTICON STERILE (SAFETY) IMPLANT
GLOVE BIO SURGEON STRL SZ8 (GLOVE) ×4 IMPLANT

## 2017-07-13 NOTE — Progress Notes (Signed)
Esophageal manometry probe placed per protocol.  Patient tolerated well.  No complications noted.  Dr. Michail Sermon to review study.

## 2017-07-14 ENCOUNTER — Encounter (HOSPITAL_COMMUNITY): Payer: Self-pay | Admitting: Gastroenterology

## 2017-10-11 DIAGNOSIS — K220 Achalasia of cardia: Secondary | ICD-10-CM | POA: Insufficient documentation

## 2018-05-31 ENCOUNTER — Other Ambulatory Visit (HOSPITAL_COMMUNITY): Payer: Self-pay | Admitting: Internal Medicine

## 2018-05-31 ENCOUNTER — Ambulatory Visit (HOSPITAL_COMMUNITY)
Admission: RE | Admit: 2018-05-31 | Discharge: 2018-05-31 | Disposition: A | Discharge status: Home / Self Care | Payer: Medicare HMO | Source: Ambulatory Visit | Attending: Internal Medicine | Admitting: Internal Medicine

## 2018-05-31 DIAGNOSIS — R600 Localized edema: Secondary | ICD-10-CM

## 2018-06-30 ENCOUNTER — Other Ambulatory Visit: Payer: Self-pay | Admitting: Gastroenterology

## 2018-07-16 ENCOUNTER — Other Ambulatory Visit: Payer: Self-pay | Admitting: Gastroenterology

## 2018-08-05 ENCOUNTER — Ambulatory Visit (HOSPITAL_COMMUNITY)
Admission: RE | Admit: 2018-08-05 | Discharge: 2018-08-05 | Disposition: A | Discharge status: Home / Self Care | Payer: Medicare HMO | Attending: Gastroenterology | Admitting: Gastroenterology

## 2018-08-05 ENCOUNTER — Encounter (HOSPITAL_COMMUNITY)
Admission: RE | Disposition: A | Discharge status: Home / Self Care | Payer: Self-pay | Source: Home / Self Care | Attending: Gastroenterology

## 2018-08-05 DIAGNOSIS — Q2739 Arteriovenous malformation, other site: Secondary | ICD-10-CM | POA: Insufficient documentation

## 2018-08-05 DIAGNOSIS — D509 Iron deficiency anemia, unspecified: Secondary | ICD-10-CM | POA: Insufficient documentation

## 2018-08-05 DIAGNOSIS — K269 Duodenal ulcer, unspecified as acute or chronic, without hemorrhage or perforation: Secondary | ICD-10-CM | POA: Insufficient documentation

## 2018-08-05 HISTORY — PX: GIVENS CAPSULE STUDY: SHX5432

## 2018-08-05 SURGERY — IMAGING PROCEDURE, GI TRACT, INTRALUMINAL, VIA CAPSULE
Anesthesia: LOCAL

## 2018-08-05 SURGICAL SUPPLY — 1 items: TOWEL COTTON PACK 4EA (MISCELLANEOUS) ×4 IMPLANT

## 2018-08-05 NOTE — Progress Notes (Signed)
Patient ingested givens capsule for small bowel endoscopy 08/05/2018 at 0800. Verbal and written  instructions given to patient. Patient will return equipment to [facility] endo 08/06/2018 AM.

## 2018-08-06 ENCOUNTER — Encounter (HOSPITAL_COMMUNITY): Payer: Self-pay | Admitting: Gastroenterology

## 2018-08-31 ENCOUNTER — Encounter (HOSPITAL_COMMUNITY): Payer: Self-pay

## 2018-08-31 ENCOUNTER — Ambulatory Visit (HOSPITAL_COMMUNITY): Admit: 2018-08-31 | Payer: Medicare HMO | Admitting: Gastroenterology

## 2018-08-31 SURGERY — COLONOSCOPY WITH PROPOFOL
Anesthesia: Monitor Anesthesia Care

## 2019-04-14 ENCOUNTER — Other Ambulatory Visit: Payer: Self-pay | Admitting: Gastroenterology

## 2019-04-14 MED ORDER — SODIUM CHLORIDE 0.9 % IV SOLN: INTRAVENOUS | Status: DC

## 2019-04-26 ENCOUNTER — Other Ambulatory Visit: Payer: Self-pay

## 2019-04-26 ENCOUNTER — Inpatient Hospital Stay: Payer: Medicare HMO

## 2019-04-26 ENCOUNTER — Inpatient Hospital Stay: Payer: Medicare HMO | Attending: Hematology and Oncology | Admitting: Hematology and Oncology

## 2019-04-26 VITALS — BP 142/66 | HR 100 | Temp 98.5°F | Resp 18 | Ht 65.0 in | Wt 191.6 lb

## 2019-04-26 DIAGNOSIS — D508 Other iron deficiency anemias: Secondary | ICD-10-CM | POA: Diagnosis not present

## 2019-04-26 DIAGNOSIS — Z7982 Long term (current) use of aspirin: Secondary | ICD-10-CM | POA: Insufficient documentation

## 2019-04-26 DIAGNOSIS — E785 Hyperlipidemia, unspecified: Secondary | ICD-10-CM | POA: Diagnosis not present

## 2019-04-26 DIAGNOSIS — Z79899 Other long term (current) drug therapy: Secondary | ICD-10-CM | POA: Diagnosis not present

## 2019-04-26 DIAGNOSIS — E039 Hypothyroidism, unspecified: Secondary | ICD-10-CM | POA: Diagnosis not present

## 2019-04-26 DIAGNOSIS — D500 Iron deficiency anemia secondary to blood loss (chronic): Secondary | ICD-10-CM | POA: Diagnosis not present

## 2019-04-26 DIAGNOSIS — E119 Type 2 diabetes mellitus without complications: Secondary | ICD-10-CM | POA: Diagnosis not present

## 2019-04-26 DIAGNOSIS — Z90710 Acquired absence of both cervix and uterus: Secondary | ICD-10-CM | POA: Diagnosis not present

## 2019-04-26 DIAGNOSIS — Z87891 Personal history of nicotine dependence: Secondary | ICD-10-CM | POA: Diagnosis not present

## 2019-04-26 DIAGNOSIS — Z7951 Long term (current) use of inhaled steroids: Secondary | ICD-10-CM | POA: Diagnosis not present

## 2019-04-26 DIAGNOSIS — I10 Essential (primary) hypertension: Secondary | ICD-10-CM | POA: Insufficient documentation

## 2019-04-26 DIAGNOSIS — Z8673 Personal history of transient ischemic attack (TIA), and cerebral infarction without residual deficits: Secondary | ICD-10-CM | POA: Insufficient documentation

## 2019-04-26 DIAGNOSIS — D571 Sickle-cell disease without crisis: Secondary | ICD-10-CM | POA: Diagnosis not present

## 2019-04-26 DIAGNOSIS — J45909 Unspecified asthma, uncomplicated: Secondary | ICD-10-CM | POA: Diagnosis not present

## 2019-04-26 LAB — CBC WITH DIFFERENTIAL (CANCER CENTER ONLY)
Abs Immature Granulocytes: 0.02 10*3/uL (ref 0.00–0.07)
Basophils Absolute: 0 10*3/uL (ref 0.0–0.1)
Basophils Relative: 0 %
Eosinophils Absolute: 0.2 10*3/uL (ref 0.0–0.5)
Eosinophils Relative: 3 %
HCT: 30.5 % — ABNORMAL LOW (ref 36.0–46.0)
Hemoglobin: 9.3 g/dL — ABNORMAL LOW (ref 12.0–15.0)
Immature Granulocytes: 0 %
Lymphocytes Relative: 22 %
Lymphs Abs: 1.3 10*3/uL (ref 0.7–4.0)
MCH: 23 pg — ABNORMAL LOW (ref 26.0–34.0)
MCHC: 30.5 g/dL (ref 30.0–36.0)
MCV: 75.3 fL — ABNORMAL LOW (ref 80.0–100.0)
Monocytes Absolute: 0.6 10*3/uL (ref 0.1–1.0)
Monocytes Relative: 10 %
Neutro Abs: 3.9 10*3/uL (ref 1.7–7.7)
Neutrophils Relative %: 65 %
Platelet Count: 289 10*3/uL (ref 150–400)
RBC: 4.05 MIL/uL (ref 3.87–5.11)
RDW: 18.8 % — ABNORMAL HIGH (ref 11.5–15.5)
WBC Count: 6.1 10*3/uL (ref 4.0–10.5)
nRBC: 0 % (ref 0.0–0.2)

## 2019-04-26 LAB — CMP (CANCER CENTER ONLY)
ALT: 19 U/L (ref 0–44)
AST: 22 U/L (ref 15–41)
Albumin: 3.4 g/dL — ABNORMAL LOW (ref 3.5–5.0)
Alkaline Phosphatase: 141 U/L — ABNORMAL HIGH (ref 38–126)
Anion gap: 8 (ref 5–15)
BUN: 10 mg/dL (ref 8–23)
CO2: 31 mmol/L (ref 22–32)
Calcium: 9.4 mg/dL (ref 8.9–10.3)
Chloride: 104 mmol/L (ref 98–111)
Creatinine: 0.85 mg/dL (ref 0.44–1.00)
GFR, Est AFR Am: >60 mL/min (ref >60)
GFR, Estimated: >60 mL/min (ref >60)
Glucose, Bld: 96 mg/dL (ref 70–99)
Potassium: 3.9 mmol/L (ref 3.5–5.1)
Sodium: 143 mmol/L (ref 135–145)
Total Bilirubin: 0.9 mg/dL (ref 0.3–1.2)
Total Protein: 7.3 g/dL (ref 6.5–8.1)

## 2019-04-26 LAB — SAVE SMEAR(SSMR), FOR PROVIDER SLIDE REVIEW

## 2019-04-26 LAB — RETIC PANEL
Immature Retic Fract: 16.4 % — ABNORMAL HIGH (ref 2.3–15.9)
RBC.: 4.09 MIL/uL (ref 3.87–5.11)
Retic Count, Absolute: 47 10*3/uL (ref 19.0–186.0)
Retic Ct Pct: 1.2 % (ref 0.4–3.1)
Reticulocyte Hemoglobin: 27.6 pg — ABNORMAL LOW (ref >27.9)

## 2019-04-26 LAB — VITAMIN B12: Vitamin B-12: 1057 pg/mL — ABNORMAL HIGH (ref 180–914)

## 2019-04-26 LAB — FOLATE: Folate: 15.5 ng/mL (ref >5.9)

## 2019-04-26 MED ORDER — IRON (FERROUS SULFATE) 325 (65 FE) MG PO TABS: 325.0000 mg | ORAL_TABLET | Freq: Every day | ORAL | 5 refills | Status: DC

## 2019-04-26 NOTE — Progress Notes (Signed)
[place] [phone]   Fax:([phone]) [place] NOTE  Patient Care Team: Rogers Blocker, MD as PCP - General (Internal Medicine)  Hematological/Oncological History  # Iron Deficiency Anemia due to Chronic Blood Loss 1) 03/26/2009: Hgb 7.2, first Hgb on record 2) 09/02/2011: 2 units of PRBC at [facility] (Hgb noted at 8.9).  3)  12/03/2011: colonoscopy, found colon polyp and sigmoid diverticula. 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 Underwent argon plasma coagulation  4) Jan 2018: colonoscopy with 2 tubular adenomas removed. Small colonic AVM and small internal hemorrhoids.  5)10/24/2017: Hgb 7.7, MCV 71, Plt 338, and WBC 6.7. POEM procedure for Achalasia. duodenal AVM fulgurated.  6) 03/2018: Hgb 8.2.  7) March 2020: capsule endoscopy: nonbleeding gastric and proximal small bowel AVM,  8) Establish Care with Dr. Lorenso Courier   CHIEF COMPLAINTS/PURPOSE OF CONSULTATION:  Iron Deficiency Anemia  HISTORY OF PRESENTING ILLNESS:  Lacey Vega 68 y.o. female with medical history significant for DM Type II, Hypothyroidism, GERD, and hx of AVMs who presents for evaluation of iron deficiency anemia.   On review of prior records Lacey Vega has a longstanding history of microcytic anemia.  This dates back to at least 2010.  She has had numerous colonoscopies and EGDs which have found bleeding AVMs in the upper and lower gastrointestinal tract.  The detailed history of these procedures is listed above.  During the course of these illnesses she has been taking p.o. iron.  Of note Lacey Vega carries a diagnosis of sickle cell anemia in her chart, however it is unclear if this is a genuine diagnosis as there is no hemoglobin electrophoresis to support this.  On exam today Lacey Vega notes that she feels well.  She notes that she does have low energy and occasional shortness of breath.  She does endorse having occasional nosebleeds and does occasionally  wake up with them she notes that it takes 2-3 tissues in order to stanch the bleeding.  The last 1 of these occurred months ago.  She denies any bruising or GYN bleeding.  She does endorse having dark stools, however she also does endorse taking iron pills twice a day.  She notes that she eats a regular to relatively normal diet.  She endorses eating hamburger meat pork chops and green beans.  She does not have any particular dietary restrictions in place at this time.  Lacey Vega is currently taking 324 mg of iron gluconate twice daily.  She is a former smoker and quit in the year 2002.  During the time where she was smokingt she smoked 1.5 packs/day.  She denies any alcohol consumption.  She is currently retired.  A full 10 point ROS is listed below and otherwise negative.  MEDICAL HISTORY:  Past Medical History:  Diagnosis Date  . Anemia   . Anxiety   . Asthma   . Asthma   . Bronchitis    hx  . Colon polyps    hyperplastic  . Diabetes ([place])    mild  . Diabetes mellitus   . DJD (degenerative joint disease), cervical   . Dysphagia    history of  . Dyspnea   . Gallstones   . GERD (gastroesophageal reflux disease)   . H/O chest pain    secondary to anemia  . Headache   . History of arteriovenous malformation (AVM)   . History of hiatal hernia   . History of tobacco use   .  Hyperlipemia   . Hypertension   . Hypothyroidism   . Iron deficiency anemia   . Sickle cell anemia (HCC)    "patient is not aware of this"  . Thyroid disease   . TIA (transient ischemic attack)    "was told that she possible had one"    SURGICAL HISTORY: Past Surgical History:  Procedure Laterality Date  . ABDOMINAL HYSTERECTOMY    . ANTERIOR CERVICAL DECOMP/DISCECTOMY FUSION N/A 10/03/2016   Procedure: ANTERIOR CERVICAL DECOMPRESSION/DISCECTOMY FUSION CERVICAL THREE- CERVICAL FOUR;  Surgeon: Ashok Pall, MD;  Location: [facility];  Service: Neurosurgery;  Laterality: N/A;  Right side approach  . BOTOX  INJECTION  08/29/2015   Procedure: BOTOX INJECTION;  Surgeon: Wilford Corner, MD;  Location: [facility];  Service: Endoscopy;;  . CHOLECYSTECTOMY    . COLONOSCOPY  12/03/2011   Procedure: COLONOSCOPY;  Surgeon: [name], MD;  Location: [facility];  Service: Endoscopy;  Laterality: N/A;  . ESOPHAGEAL MANOMETRY N/A 07/13/2017   Procedure: ESOPHAGEAL MANOMETRY (EM);  Surgeon: Wilford Corner, MD;  Location: [facility];  Service: Endoscopy;  Laterality: N/A;  . ESOPHAGOGASTRODUODENOSCOPY  12/03/2011   Procedure: ESOPHAGOGASTRODUODENOSCOPY (EGD);  Surgeon: [name], MD;  Location: [facility];  Service: Endoscopy;  Laterality: N/A;  . ESOPHAGOGASTRODUODENOSCOPY (EGD) WITH PROPOFOL N/A 08/29/2015   Procedure: ESOPHAGOGASTRODUODENOSCOPY (EGD) WITH PROPOFOL;  Surgeon: Wilford Corner, MD;  Location: [facility] END[facility]COPY;  Service: Endoscopy;  Laterality: N/A;  . GIVENS CAPSULE STUDY N/A 08/05/2018   Procedure: GIVENS CAPSULE STUDY;  Surgeon: Wilford Corner, MD;  Location: [facility];  Service: Endoscopy;  Laterality: N/A;  . HOT HEMOSTASIS  12/03/2011   Procedure: HOT HEMOSTASIS (ARGON PLASMA COAGULATION/BICAP);  Surgeon: [name], MD;  Location: [facility];  Service: Endoscopy;  Laterality: N/A;  . NECK SURGERY    . ROTATOR CUFF REPAIR     right  . ROTATOR CUFF REPAIR Right 2014  . TONSILLECTOMY      SOCIAL HISTORY: Social History   Socioeconomic History  . Marital status: Divorced    Spouse name: Not on file  . Number of children: 2  . Years of education: Not on file  . Highest education level: Not on file  Occupational History  . 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 Financial resource strain: Not on file  . Food insecurity    Worry: Not on file    Inability: Not on file  . Transportation needs    Medical: Not on file    Non-medical: Not on file  Tobacco Use   . Smoking status: Former Smoker    Types: Cigarettes    Quit date: 06/02/2000    Years since quitting: 18.9  . Smokeless tobacco: Never Used  Substance and Sexual Activity  . Alcohol use: No  . Drug use: No  . Sexual activity: Not on file  Lifestyle  . Physical activity    Days per week: Not on file    Minutes per session: Not on file  . Stress: Not on file  Relationships  . Social Herbalist on phone: Not on file    Gets together: Not on file    Attends religious service: Not on file    Active member of club or organization: Not on file    Attends meetings of clubs or organizations: Not on file    Relationship status: Not on file  .  Intimate partner violence    Fear of current or ex partner: Not on file    Emotionally abused: Not on file    Physically abused: Not on file    Forced sexual activity: Not on file  Other Topics Concern  . Not on file  Social History Narrative   ** Merged History Encounter **        FAMILY HISTORY: Family History  Problem Relation Age of Onset  . Diabetes Mother   . Heart attack Brother   . Cancer Maternal Aunt     ALLERGIES:  is allergic to oxycodone and oxycodone.  MEDICATIONS:  Current Outpatient Medications  Medication Sig Dispense Refill  . albuterol (VENTOLIN HFA) 108 (90 Base) MCG/ACT inhaler Inhale into the lungs every 6 (six) hours as needed for wheezing or shortness of breath.    Marland Kitchen atorvastatin (LIPITOR) 40 MG tablet Take 40 mg by mouth daily.    . Biotin 5000 MCG TABS Take by mouth 2 (two) times daily.    . Cyanocobalamin (VITAMIN B 12) 500 MCG TABS Take 1,000 mg by mouth every morning.    . donepezil (ARICEPT) 5 MG tablet Take 5 mg by mouth at bedtime.    Marland Kitchen levothyroxine (SYNTHROID) 137 MCG tablet Take 137 mcg by mouth daily before breakfast.    . aspirin EC 81 MG tablet Take 81 mg by mouth daily.    . budesonide-formoterol (SYMBICORT) 160-4.5 MCG/ACT inhaler Inhale 2 puffs into the lungs 2 (two) times daily as  needed (for shortness of breath).    . estrogens, conjugated, (PREMARIN) 0.3 MG tablet Take 0.3 mg by mouth daily. Take daily for 21 days then do not take for 7 days.    . furosemide (LASIX) 40 MG tablet Take 40 mg by mouth daily.    . Iron, Ferrous Sulfate, 325 (65 Fe) MG TABS Take 325 mg by mouth daily. Please take 1 pill daily with a source of Vitamin C ( orange juice or 500 units of Vitamin C). 30 tablet 5  . mometasone-formoterol (DULERA) 100-5 MCG/ACT AERO Inhale 2 puffs into the lungs as needed.    . montelukast (SINGULAIR) 10 MG tablet Take 10 mg by mouth daily.      . Multiple Vitamins-Minerals (CENTRUM SILVER PO) Take 1 tablet by mouth daily.    . nitroGLYCERIN (NITROSTAT) 0.4 MG SL tablet Place 0.4 mg under the tongue every 5 (five) minutes as needed for chest pain.    Marland Kitchen omeprazole (PRILOSEC) 40 MG capsule Take 40 mg by mouth daily.     No current facility-administered medications for this visit.    Facility-Administered Medications Ordered in Other Visits  Medication Dose Route Frequency Provider Last Rate Last Dose  . 0.9 %  sodium chloride infusion   Intravenous Continuous Wilford Corner, MD        REVIEW OF SYSTEMS:   Constitutional: ( - ) fevers, ( - )  chills , ( - ) night sweats Eyes: ( - ) blurriness of vision, ( - ) double vision, ( - ) watery eyes Ears, nose, mouth, throat, and face: ( - ) mucositis, ( - ) sore throat Respiratory: ( - ) cough, ( + ) dyspnea, ( - ) wheezes Cardiovascular: ( - ) palpitation, ( - ) chest discomfort, ( - ) lower extremity swelling Gastrointestinal:  ( - ) nausea, ( - ) heartburn, ( - ) change in bowel habits Skin: ( - ) abnormal skin rashes Lymphatics: ( - ) new lymphadenopathy, ( - )  easy bruising Neurological: ( - ) numbness, ( - ) tingling, ( - ) new weaknesses Behavioral/Psych: ( - ) mood change, ( - ) new changes  All other systems were reviewed with the patient and are negative.  PHYSICAL EXAMINATION: ECOG PERFORMANCE STATUS:  1 - Symptomatic but completely ambulatory  Vitals:   04/26/19 1259  BP: (!) 142/66  Pulse: 100  Resp: 18  Temp: 98.5 F (36.9 C)  SpO2: 97%   Filed Weights   04/26/19 1259  Weight: 191 lb 9.6 oz (86.9 kg)    GENERAL: well appearing elderly African American female in NAD  SKIN: skin color, texture, turgor are normal, no rashes or significant lesions EYES: conjunctiva are pink and non-injected, sclera clear LUNGS: diminished breath sounds bilaterally.  HEART: regular rate & rhythm and no murmurs and no lower extremity edema ABDOMEN: soft, non-tender, non-distended, normal bowel sounds Musculoskeletal: no cyanosis of digits and no clubbing  PSYCH: alert & oriented x 3, fluent speech NEURO: no focal motor/sensory deficits  LABORATORY DATA:  I have reviewed the data as listed Lab Results  Component Value Date   WBC 6.1 04/26/2019   HGB 9.3 (L) 04/26/2019   HCT 30.5 (L) 04/26/2019   MCV 75.3 (L) 04/26/2019   PLT 289 04/26/2019   NEUTROABS PENDING 04/26/2019   Lab Results  Component Value Date   HGB 9.3 (L) 04/26/2019   HGB 9.2 (L) 10/03/2016   HGB 9.4 (L) 09/25/2016   HGB 10.8 (L) 12/04/2010   HGB 9.8 (L) 11/09/2009   HGB 9.8 (L) 10/03/2009   HGB 11.2 (L) 09/16/2009   HGB 11.0 (L) 09/15/2009   HGB 10.7 POST TRANSFUSION SPECIMEN DELTA CHECK NOTED (L) 09/14/2009   HGB 8.5 (L) 09/13/2009   HGB 9.0 (L) 09/11/2009   HGB 8.1 L g/dL (L) 08/16/2009     PATHOLOGY: None to review  BLOOD FILM:  I personally reviewed the patient's peripheral blood smear today.  There was no peripheral blast.  The white blood cells and red blood cells were of normal morphology. There was no schistocytosis or anisocytosis.  The platelets are of normal size and I have verified that there were no platelet clumping.  RADIOGRAPHIC STUDIES: None to review.  ASSESSMENT & Rainbow 68 y.o. female with medical history significant for DM Type II, Hypothyroidism, GERD, and hx of AVMs  who presents for evaluation of iron deficiency anemia.  After review of her records and discussion of her history it is clear that Lacey Vega has an iron deficiency anemia secondary to chronic GI blood loss.  She has nosebleeds, but no other overt or heavy sources of bleeding.  It is apparent that she has received transfusions in the past however it is unclear that she has ever received IV iron.  Most recently she was taking ferric gluconate (which contains low levels of elemental iron) and was taking this twice daily which is not recommended as it decreases total iron absorption.  Given this, I would recommend starting with p.o. daily iron with a source of vitamin C in order to increase absorption.  We will have her follow-up in 3 months time, however if her hemoglobin has not been found to rise and iron levels remain low we will consider IV iron at that time.  Her hemoglobin is currently 9.3 which is relatively stable over the last 2 years.  Additionally she carries a diagnosis of sickle cell anemia, which is not verified in our records.  She does have  a single note from the sickle cell center during which time she received a blood transfusion, however it was not noted that she had sickle cell anemia.  This history will need to be clarified further confirmatory hemoglobin electropheresis.  #Iron Deficiency Anemia from Chronic Blood Loss --today will collect CBC, CMP, Iron panel, and Vitamin B12/folate/MMA/homocysteine to assess nutritional status --recommend PO ferrous sulfide 325mg  daily with source of vitamin C. Patient previously took PO iron, however it was often BID or low elemental iron, which is not recommended.  --source of blood loss is clearly identified as AVMs in the GI tract.  --transfusion goal for Hgb <7.0.  --can consider PO iron if appropriate PO supplementation does not correct her Hgb --RTC in 3 months to reassess  #Sickle Cell Anemia --patient carries this diagnosis in her chart,  though there is no Hgb electrophoresis to confirm this and she is "unaware" of this diagnosis --will discuss with patient further, consider Hgb electrophoresis at next visit.   #Bleeding AVM/GI Bleeding --currently followed by [facility] --continue to monitor.   Orders Placed This Encounter  Procedures  . CBC with Differential (Cancer Center Only)    Standing Status:   Future    Number of Occurrences:   1    Standing Expiration Date:   04/25/2020  . Retic Panel    Standing Status:   Future    Number of Occurrences:   1    Standing Expiration Date:   04/25/2020  . Save Smear (SSMR)    Standing Status:   Future    Number of Occurrences:   1    Standing Expiration Date:   04/25/2020  . CMP ([facility] [place] only)    Standing Status:   Future    Number of Occurrences:   1    Standing Expiration Date:   04/25/2020  . Iron and TIBC    Standing Status:   Future    Number of Occurrences:   1    Standing Expiration Date:   04/25/2020  . Ferritin    Standing Status:   Future    Number of Occurrences:   1    Standing Expiration Date:   04/25/2020  . Methylmalonic acid, serum    Standing Status:   Future    Number of Occurrences:   1    Standing Expiration Date:   04/25/2020  . Vitamin B12    Standing Status:   Future    Number of Occurrences:   1    Standing Expiration Date:   04/25/2020  . Folate, Serum    Standing Status:   Future    Number of Occurrences:   1    Standing Expiration Date:   04/25/2020  . Homocysteine, serum    Standing Status:   Future    Number of Occurrences:   1    Standing Expiration Date:   04/25/2020    All questions were answered. The patient knows to call the clinic with any problems, questions or concerns.  A total of more than 60 minutes were spent face-to-face with the patient during this encounter and over half of that time was spent on counseling and coordination of care as outlined above.   Ledell Peoples, MD Department of Hematology/Oncology  [facility] [facility] Phone: [phone] Pager: [phone] Email: [email or web address]  04/26/2019 3:23 PM

## 2019-04-27 LAB — IRON AND TIBC
Iron: 24 ug/dL — ABNORMAL LOW (ref 41–142)
Saturation Ratios: 7 % — ABNORMAL LOW (ref 21–57)
TIBC: 344 ug/dL (ref 236–444)
UIBC: 319 ug/dL (ref 120–384)

## 2019-04-27 LAB — FERRITIN: Ferritin: 37 ng/mL (ref 11–307)

## 2019-04-29 LAB — HOMOCYSTEINE: Homocysteine: 11.7 umol/L (ref 0.0–17.2)

## 2019-05-03 LAB — METHYLMALONIC ACID, SERUM: Methylmalonic Acid, Quantitative: 76 nmol/L (ref 0–378)

## 2019-05-11 ENCOUNTER — Other Ambulatory Visit: Payer: Self-pay | Admitting: Gastroenterology

## 2019-05-13 ENCOUNTER — Other Ambulatory Visit (HOSPITAL_COMMUNITY)
Admission: RE | Admit: 2019-05-13 | Discharge: 2019-05-13 | Disposition: A | Discharge status: Home / Self Care | Payer: Medicare HMO | Source: Ambulatory Visit | Attending: Gastroenterology | Admitting: Gastroenterology

## 2019-05-13 DIAGNOSIS — Z01812 Encounter for preprocedural laboratory examination: Secondary | ICD-10-CM | POA: Diagnosis present

## 2019-05-13 DIAGNOSIS — Z20828 Contact with and (suspected) exposure to other viral communicable diseases: Secondary | ICD-10-CM | POA: Diagnosis not present

## 2019-05-14 LAB — NOVEL CORONAVIRUS, NAA (HOSP ORDER, SEND-OUT TO REF LAB; TAT 18-24 HRS): SARS-CoV-2, NAA: NOT DETECTED

## 2019-05-17 ENCOUNTER — Ambulatory Visit (HOSPITAL_COMMUNITY)
Admission: RE | Admit: 2019-05-17 | Discharge: 2019-05-17 | Disposition: A | Discharge status: Home / Self Care | Payer: Medicare HMO | Attending: Gastroenterology | Admitting: Gastroenterology

## 2019-05-17 ENCOUNTER — Encounter (HOSPITAL_COMMUNITY): Payer: Self-pay | Admitting: Gastroenterology

## 2019-05-17 ENCOUNTER — Other Ambulatory Visit: Payer: Self-pay

## 2019-05-17 ENCOUNTER — Ambulatory Visit (HOSPITAL_COMMUNITY): Payer: Medicare HMO | Admitting: Anesthesiology

## 2019-05-17 ENCOUNTER — Encounter (HOSPITAL_COMMUNITY)
Admission: RE | Disposition: A | Discharge status: Home / Self Care | Payer: Self-pay | Source: Home / Self Care | Attending: Gastroenterology

## 2019-05-17 DIAGNOSIS — Z8673 Personal history of transient ischemic attack (TIA), and cerebral infarction without residual deficits: Secondary | ICD-10-CM | POA: Diagnosis not present

## 2019-05-17 DIAGNOSIS — K2101 Gastro-esophageal reflux disease with esophagitis, with bleeding: Secondary | ICD-10-CM | POA: Insufficient documentation

## 2019-05-17 DIAGNOSIS — Z79899 Other long term (current) drug therapy: Secondary | ICD-10-CM | POA: Diagnosis not present

## 2019-05-17 DIAGNOSIS — Z7951 Long term (current) use of inhaled steroids: Secondary | ICD-10-CM | POA: Insufficient documentation

## 2019-05-17 DIAGNOSIS — E119 Type 2 diabetes mellitus without complications: Secondary | ICD-10-CM | POA: Insufficient documentation

## 2019-05-17 DIAGNOSIS — E039 Hypothyroidism, unspecified: Secondary | ICD-10-CM | POA: Diagnosis not present

## 2019-05-17 DIAGNOSIS — J449 Chronic obstructive pulmonary disease, unspecified: Secondary | ICD-10-CM | POA: Insufficient documentation

## 2019-05-17 DIAGNOSIS — M1990 Unspecified osteoarthritis, unspecified site: Secondary | ICD-10-CM | POA: Insufficient documentation

## 2019-05-17 DIAGNOSIS — E785 Hyperlipidemia, unspecified: Secondary | ICD-10-CM | POA: Diagnosis not present

## 2019-05-17 DIAGNOSIS — K31819 Angiodysplasia of stomach and duodenum without bleeding: Secondary | ICD-10-CM | POA: Insufficient documentation

## 2019-05-17 DIAGNOSIS — Z79890 Hormone replacement therapy (postmenopausal): Secondary | ICD-10-CM | POA: Insufficient documentation

## 2019-05-17 DIAGNOSIS — D509 Iron deficiency anemia, unspecified: Secondary | ICD-10-CM | POA: Insufficient documentation

## 2019-05-17 DIAGNOSIS — I10 Essential (primary) hypertension: Secondary | ICD-10-CM | POA: Diagnosis not present

## 2019-05-17 DIAGNOSIS — Z87891 Personal history of nicotine dependence: Secondary | ICD-10-CM | POA: Diagnosis not present

## 2019-05-17 DIAGNOSIS — K220 Achalasia of cardia: Secondary | ICD-10-CM | POA: Diagnosis not present

## 2019-05-17 DIAGNOSIS — Z7982 Long term (current) use of aspirin: Secondary | ICD-10-CM | POA: Diagnosis not present

## 2019-05-17 HISTORY — PX: ESOPHAGOGASTRODUODENOSCOPY (EGD) WITH PROPOFOL: SHX5813

## 2019-05-17 SURGERY — ESOPHAGOGASTRODUODENOSCOPY (EGD) WITH PROPOFOL
Anesthesia: Monitor Anesthesia Care

## 2019-05-17 MED ORDER — SODIUM CHLORIDE 0.9 % IV SOLN: INTRAVENOUS | Status: DC

## 2019-05-17 MED ORDER — LACTATED RINGERS IV SOLN: INTRAVENOUS | Status: DC

## 2019-05-17 MED ORDER — PROPOFOL 500 MG/50ML IV EMUL
INTRAVENOUS | Status: DC | PRN
  Administered 2019-05-17: 100 ug/kg/min via INTRAVENOUS

## 2019-05-17 MED ORDER — PROPOFOL 500 MG/50ML IV EMUL
INTRAVENOUS | Status: DC | PRN
  Administered 2019-05-17: 20 mg via INTRAVENOUS
  Administered 2019-05-17: 40 mg via INTRAVENOUS
  Administered 2019-05-17: 20 mg via INTRAVENOUS

## 2019-05-17 MED ORDER — PROPOFOL 500 MG/50ML IV EMUL
INTRAVENOUS | Status: AC
  Filled 2019-05-17: qty 50

## 2019-05-17 SURGICAL SUPPLY — 15 items

## 2019-05-17 NOTE — Transfer of Care (Signed)
Immediate Anesthesia Transfer of Care Note  Patient: Lacey Vega  Procedure(s) Performed: ESOPHAGOGASTRODUODENOSCOPY (EGD) WITH PROPOFOL (N/A )  Patient Location: PACU  Anesthesia Type:MAC  Level of Consciousness: drowsy, patient cooperative and responds to stimulation  Airway & Oxygen Therapy: Patient Spontanous Breathing and Patient connected to face mask oxygen  Post-op Assessment: Report given to RN and Post -op Vital signs reviewed and stable  Post vital signs: Reviewed and stable  Last Vitals:  Vitals Value Taken Time  BP    Temp    Pulse 84 05/17/19 0951  Resp 18 05/17/19 0951  SpO2 100 % 05/17/19 0951  Vitals shown include unvalidated device data.  Last Pain:  Vitals:   05/17/19 0813  TempSrc: Oral  PainSc: 0-No pain         Complications: No apparent anesthesia complications

## 2019-05-17 NOTE — Interval H&P Note (Signed)
History and Physical Interval Note:  05/17/2019 9:15 AM  Lacey Vega  has presented today for surgery, with the diagnosis of Iron deficiency anemia.  The various methods of treatment have been discussed with the patient and family. After consideration of risks, benefits and other options for treatment, the patient has consented to  Procedure(s): ESOPHAGOGASTRODUODENOSCOPY (EGD) WITH PROPOFOL (N/A) HOT HEMOSTASIS (ARGON PLASMA COAGULATION/BICAP) (N/A) as a surgical intervention.  The patient's history has been reviewed, patient examined, no change in status, stable for surgery.  I have reviewed the patient's chart and labs.  Questions were answered to the patient's satisfaction.     [name]

## 2019-05-17 NOTE — Discharge Instructions (Signed)
YOU HAD AN ENDOSCOPIC PROCEDURE TODAY: Refer to the procedure report and other information in the discharge instructions given to you for any specific questions about what was found during the examination. If this information does not answer your questions, please call [facility] office at [phone] to clarify.   YOU SHOULD EXPECT: Some feelings of bloating in the abdomen. Passage of more gas than usual. Walking can help get rid of the air that was put into your GI tract during the procedure and reduce the bloating.   DIET: Your first meal following the procedure should be a light meal and then it is ok to progress to your normal diet. A half-sandwich or bowl of soup is an example of a good first meal. Heavy or fried foods are harder to digest and may make you feel nauseous or bloated. Drink plenty of fluids but you should avoid alcoholic beverages for 24 hours.     ACTIVITY: Your care partner should take you home directly after the procedure. You should plan to take it easy, moving slowly for the rest of the day. You can resume normal activity the day after the procedure however YOU SHOULD NOT DRIVE, use power tools, machinery or perform tasks that involve climbing or major physical exertion for 24 hours (because of the sedation medicines used during the test).   SYMPTOMS TO REPORT IMMEDIATELY: A gastroenterologist can be reached at any hour. Please call [phone]  for any of the following symptoms:   Following upper endoscopy (EGD, EUS, ERCP, esophageal dilation) Vomiting of blood or coffee ground material  New, significant abdominal pain  New, significant chest pain or pain under the shoulder blades  Painful or persistently difficult swallowing  New shortness of breath  Black, tarry-looking or red, bloody stools  FOLLOW UP:  If any biopsies were taken you will be contacted by phone or by letter within the next 1-3 weeks. Call [phone]  if you have not heard about the biopsies in 3  weeks.  Please also call with any specific questions about appointments or follow up tests.  

## 2019-05-17 NOTE — Anesthesia Preprocedure Evaluation (Addendum)
Anesthesia Evaluation  Patient identified by MRN, date of birth, ID band Patient awake    Reviewed: Allergy & Precautions, NPO status , Patient's Chart, lab work & pertinent test results  History of Anesthesia Complications Negative for: history of anesthetic complications  Airway Mallampati: I  TM Distance: >3 FB Neck ROM: Full    Dental  (+) Edentulous Upper, Edentulous Lower   Pulmonary shortness of breath, asthma , COPD,  COPD inhaler, former smoker,    breath sounds clear to auscultation       Cardiovascular hypertension, Pt. on medications  Rhythm:Regular     Neuro/Psych  Headaches, PSYCHIATRIC DISORDERS Anxiety TIA   GI/Hepatic hiatal hernia, GERD  ,? GI bleed   Endo/Other  diabetesHypothyroidism   Renal/GU      Musculoskeletal  (+) Arthritis ,   Abdominal   Peds  Hematology  (+) Blood dyscrasia, anemia ,   Anesthesia Other Findings   Reproductive/Obstetrics                             Anesthesia Physical Anesthesia Plan  ASA: II  Anesthesia Plan: MAC   Post-op Pain Management:    Induction: Intravenous  PONV Risk Score and Plan: 2 and Treatment may vary due to age or medical condition and Propofol infusion  Airway Management Planned: Nasal Cannula  Additional Equipment: None  Intra-op Plan:   Post-operative Plan:   Informed Consent: I have reviewed the patients History and Physical, chart, labs and discussed the procedure including the risks, benefits and alternatives for the proposed anesthesia with the patient or authorized representative who has indicated his/her understanding and acceptance.     Dental advisory given  Plan Discussed with: CRNA and Surgeon  Anesthesia Plan Comments:         Anesthesia Quick Evaluation

## 2019-05-17 NOTE — Op Note (Signed)
[facility] Patient Name: Lacey Vega Procedure Date: 05/17/2019 MRN: [id number] Attending MD: [name] , MD Date of Birth: [birth year] CSN: [id number] Age: 68 Admit Type: Outpatient Procedure:                Upper GI endoscopy Indications:              Iron deficiency anemia, Achalasia Providers:                [name], MD, Cleda Daub, RN, Elspeth Cho Tech., Technician, Janeece Agee, Technician,                            Rosario Adie, CRNA Referring MD:             Carolann Littler. Dean Medicines:                Propofol per Anesthesia, Monitored Anesthesia Care Complications:            No immediate complications. Estimated Blood Loss:     Estimated blood loss: none. Procedure:                Pre-Anesthesia Assessment:                           - Prior to the procedure, a History and Physical                            was performed, and patient medications and                            allergies were reviewed. The patient's tolerance of                            previous anesthesia was also reviewed. The risks                            and benefits of the procedure and the sedation                            options and risks were discussed with the patient.                            All questions were answered, and informed consent                            was obtained. Prior Anticoagulants: The patient has                            taken no previous anticoagulant or antiplatelet                            agents. ASA Grade Assessment: III - A patient with  severe systemic disease. After reviewing the risks                            and benefits, the patient was deemed in                            satisfactory condition to undergo the procedure.                           After obtaining informed consent, the endoscope was                            passed under direct vision.  Throughout the                            procedure, the patient's blood pressure, pulse, and                            oxygen saturations were monitored continuously. The                            [id number]) Olympus gastroscope was                            introduced through the mouth, and advanced to the                            second part of duodenum. The upper GI endoscopy was                            accomplished without difficulty. The patient                            tolerated the procedure well. Scope In: Scope Out: Findings:      LA Grade C (one or more mucosal breaks continuous between tops of 2 or       more mucosal folds, less than 75% circumference) esophagitis with       bleeding was found at the gastroesophageal junction.      The entire examined stomach was normal.      The cardia and gastric fundus were normal on retroflexion.      A few diminutive angiodysplastic lesions without bleeding were found in       the second portion of the duodenum.      Patchy mild mucosal changes characterized by congestion and erythema       were found in the duodenal bulb. Impression:               - LA Grade C esophagitis with bleeding.                           - Normal stomach.                           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                           - Mucosal changes in the duodenum.                           - No specimens collected. Moderate Sedation:      Not Applicable - Patient had care per Anesthesia. Recommendation:           - Patient has a contact number available for                            emergencies. The signs and symptoms of potential                            delayed complications were discussed with the                            patient. Return to normal activities tomorrow.                            Written discharge instructions were provided to the                             patient.                           - Resume previous diet. Procedure Code(s):        --- Professional ---                           [phone], Esophagogastroduodenoscopy, flexible,                            transoral; diagnostic, including collection of                            specimen(s) by brushing or washing, when performed                            (separate procedure) Diagnosis Code(s):        --- Professional ---                           D50.9, Iron deficiency anemia, unspecified                           K22.0, Achalasia of cardia                           K20.91, Esophagitis, unspecified with bleeding                           K31.819, Angiodysplasia of stomach and duodenum                            without bleeding  K31.89, Other diseases of stomach and duodenum CPT copyright 2019 American Medical Association. All rights reserved. The codes documented in this report are preliminary and upon coder review may  be revised to meet current compliance requirements. [name], MD 05/17/2019 9:52:57 AM This report has been signed electronically. Number of Addenda: 0

## 2019-05-17 NOTE — Interval H&P Note (Signed)
History and Physical Interval Note:  05/17/2019 8:17 AM  Lacey Vega  has presented today for surgery, with the diagnosis of Iron deficiency anemia.  The various methods of treatment have been discussed with the patient and family. After consideration of risks, benefits and other options for treatment, the patient has consented to  Procedure(s): ESOPHAGOGASTRODUODENOSCOPY (EGD) WITH PROPOFOL (N/A) HOT HEMOSTASIS (ARGON PLASMA COAGULATION/BICAP) (N/A) as a surgical intervention.  The patient's history has been reviewed, patient examined, no change in status, stable for surgery.  I have reviewed the patient's chart and labs.  Questions were answered to the patient's satisfaction.     [name]

## 2019-05-17 NOTE — H&P (Signed)
Date of Initial H&P: 05/11/19  History reviewed, patient examined, no change in status, stable for surgery.

## 2019-05-18 ENCOUNTER — Encounter: Payer: Self-pay | Admitting: *Deleted

## 2019-05-18 NOTE — Anesthesia Postprocedure Evaluation (Signed)
Anesthesia Post Note  Patient: Lacey Vega  Procedure(s) Performed: ESOPHAGOGASTRODUODENOSCOPY (EGD) WITH PROPOFOL (N/A )     Patient location during evaluation: Endoscopy Anesthesia Type: MAC Level of consciousness: awake and alert Pain management: pain level controlled Vital Signs Assessment: post-procedure vital signs reviewed and stable Respiratory status: spontaneous breathing, nonlabored ventilation, respiratory function stable and patient connected to nasal cannula oxygen Cardiovascular status: stable and blood pressure returned to baseline Postop Assessment: no apparent nausea or vomiting Anesthetic complications: no    Last Vitals:  Vitals:   05/17/19 0952 05/17/19 1000  BP: (!) 104/43 (!) 103/53  Pulse: 78 80  Resp: (!) 21 (!) 21  Temp: 36.5 C   SpO2: 100% 100%    Last Pain:  Vitals:   05/17/19 1000  TempSrc:   PainSc: 0-No pain                 [name]

## 2019-05-18 NOTE — Progress Notes (Signed)
Attempted, unable to leave message/bt 

## 2019-05-31 ENCOUNTER — Telehealth: Payer: Self-pay | Admitting: Hematology and Oncology

## 2019-05-31 NOTE — Telephone Encounter (Signed)
Scheduled per 11/24 los. Called and left VM. Mailing printout

## 2019-06-15 ENCOUNTER — Ambulatory Visit: Payer: Medicare HMO | Attending: Internal Medicine

## 2019-06-15 DIAGNOSIS — Z20822 Contact with and (suspected) exposure to covid-19: Secondary | ICD-10-CM

## 2019-06-17 ENCOUNTER — Telehealth: Payer: Self-pay | Admitting: Internal Medicine

## 2019-06-17 LAB — NOVEL CORONAVIRUS, NAA: SARS-CoV-2, NAA: NOT DETECTED

## 2019-06-17 NOTE — Telephone Encounter (Signed)
Patient called in and received her negative covid test result  

## 2019-07-26 ENCOUNTER — Other Ambulatory Visit: Payer: Self-pay | Admitting: Hematology and Oncology

## 2019-07-26 DIAGNOSIS — D500 Iron deficiency anemia secondary to blood loss (chronic): Secondary | ICD-10-CM

## 2019-07-26 NOTE — Progress Notes (Signed)
[facility] [phone]   [phone]  PROGRESS NOTE  Patient Care Team: Rogers Blocker, MD as PCP - General (Internal Medicine)  Hematological/Oncological History # Iron Deficiency Anemia due to Chronic Blood Loss 1) 03/26/2009: Hgb 7.2, first Hgb on record 2) 09/02/2011: 2 units of PRBC at [facility] (Hgb noted at 8.9).  3)  12/03/2011: colonoscopy, found colon polyp and sigmoid diverticula. 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 Underwent argon plasma coagulation  4) Jan 2018: colonoscopy with 2 tubular adenomas removed. Small colonic AVM and small internal hemorrhoids.  5)10/24/2017: Hgb 7.7, MCV 71, Plt 338, and WBC 6.7. POEM procedure for Achalasia. duodenal AVM fulgurated.  6) 03/2018: Hgb 8.2.  7) March 2020: capsule endoscopy: nonbleeding gastric and proximal small bowel AVM,  8) 04/26/2019: Establish Care with Dr. Lorenso Courier  WBC 6.1, Hgb 9.3, MCV 75.3, Plt 289 9) 05/17/2019: Underwent EGD which showed esophagitis and small non-bleeding duodenal angiodysplasias.  Interval History:  Lacey Vega 69 y.o. female with medical history significant for iron deficiency anemia 2/2 to chronic blood loss presents for a follow up visit. The patient's last visit was on 04/26/2019. In the interim since the last visit Lacey Vega had an EGD on 05/17/2019 which showed esophagitis and small non-bleeding duodenal angiodysplasias.   On exam today she reports that she continues to have fatigue and feels tired.  She reports that she does get short of breath when walking longer distances.  She notes that she continues to have cravings for ice, though that is reduced from prior.  Her stools she notes can vary in color and notes that they are occasionally dark black to brownish in color.  She does continue to have occasional nosebleeds which are easily stopped with a few tissues.  On further discussion she reports that she continues to have some nausea and  heartburn without any vomiting.  She notes that her weight has been stable at 190 pounds.  She does endorse eating a regular diet with no restrictions that includes red meat, pork, and fish.  She underwent endoscopy on 05/17/2019 which showed esophagitis and nonbleeding gastric and proximal small bowel AVMs, however the patient was unaware of these findings.  A full 10 point ROS is listed below.  MEDICAL HISTORY:  Past Medical History:  Diagnosis Date  . Anemia   . Anxiety   . Asthma   . Asthma   . Bronchitis    hx  . Colon polyps    hyperplastic  . Diabetes ([place])    mild  . Diabetes mellitus   . DJD (degenerative joint disease), cervical   . Dysphagia    history of  . Dyspnea   . Gallstones   . GERD (gastroesophageal reflux disease)   . H/O chest pain    secondary to anemia  . Headache   . History of arteriovenous malformation (AVM)   . History of hiatal hernia   . History of tobacco use   . Hyperlipemia   . Hypertension   . Hypothyroidism   . Iron deficiency anemia   . Sickle cell anemia (HCC)    "patient is not aware of this"  . Thyroid disease   . TIA (transient ischemic attack)    "was told that she possible had one"    SURGICAL HISTORY: Past Surgical History:  Procedure Laterality Date  . ABDOMINAL HYSTERECTOMY    . ANTERIOR CERVICAL DECOMP/DISCECTOMY FUSION N/A 10/03/2016   Procedure: ANTERIOR CERVICAL DECOMPRESSION/DISCECTOMY FUSION  CERVICAL THREE- CERVICAL FOUR;  Surgeon: Ashok Pall, MD;  Location: [facility];  Service: Neurosurgery;  Laterality: N/A;  Right side approach  . BOTOX INJECTION  08/29/2015   Procedure: BOTOX INJECTION;  Surgeon: Wilford Corner, MD;  Location: [facility];  Service: Endoscopy;;  . CHOLECYSTECTOMY    . COLONOSCOPY  12/03/2011   Procedure: COLONOSCOPY;  Surgeon: [name], MD;  Location: [facility];  Service: Endoscopy;  Laterality: N/A;  . ESOPHAGEAL MANOMETRY N/A 07/13/2017   Procedure: ESOPHAGEAL MANOMETRY (EM);   Surgeon: Wilford Corner, MD;  Location: [facility];  Service: Endoscopy;  Laterality: N/A;  . ESOPHAGOGASTRODUODENOSCOPY  12/03/2011   Procedure: ESOPHAGOGASTRODUODENOSCOPY (EGD);  Surgeon: [name], MD;  Location: [facility];  Service: Endoscopy;  Laterality: N/A;  . ESOPHAGOGASTRODUODENOSCOPY (EGD) WITH PROPOFOL N/A 08/29/2015   Procedure: ESOPHAGOGASTRODUODENOSCOPY (EGD) WITH PROPOFOL;  Surgeon: Wilford Corner, MD;  Location: [facility]COPY;  Service: Endoscopy;  Laterality: N/A;  . ESOPHAGOGASTRODUODENOSCOPY (EGD) WITH PROPOFOL N/A 05/17/2019   Procedure: ESOPHAGOGASTRODUODENOSCOPY (EGD) WITH PROPOFOL;  Surgeon: Wilford Corner, MD;  Location: [facility];  Service: Endoscopy;  Laterality: N/A;  . GIVENS CAPSULE STUDY N/A 08/05/2018   Procedure: GIVENS CAPSULE STUDY;  Surgeon: Wilford Corner, MD;  Location: [facility];  Service: Endoscopy;  Laterality: N/A;  . HOT HEMOSTASIS  12/03/2011   Procedure: HOT HEMOSTASIS (ARGON PLASMA COAGULATION/BICAP);  Surgeon: [name], MD;  Location: [facility];  Service: Endoscopy;  Laterality: N/A;  . NECK SURGERY    . ROTATOR CUFF REPAIR     right  . ROTATOR CUFF REPAIR Right 2014  . TONSILLECTOMY      SOCIAL HISTORY: Social History   Socioeconomic History  . Marital status: Divorced    Spouse name: Not on file  . Number of children: 2  . Years of education: Not on file  . Highest education level: Not on file  Occupational History  . Occupation: group Designer, multimedia: [employer]    Comment: [employer]  Tobacco Use  . Smoking status: Former Smoker    Types: Cigarettes    Quit date: 06/02/2000    Years since quitting: 19.1  . Smokeless tobacco: Never Used  Substance and Sexual Activity  . Alcohol use: No  . Drug use: No  . Sexual activity: Not on file  Other Topics Concern  . Not on file  Social History Narrative   ** Merged History Encounter **       Social  Determinants of Health   Financial Resource Strain:   . Difficulty of Paying Living Expenses: Not on file  Food Insecurity:   . Worried About Charity fundraiser in the Last Year: Not on file  . Ran Out of Food in the Last Year: Not on file  Transportation Needs:   . Lack of Transportation (Medical): Not on file  . Lack of Transportation (Non-Medical): Not on file  Physical Activity:   . Days of Exercise per Week: Not on file  . Minutes of Exercise per Session: Not on file  Stress:   . Feeling of Stress : Not on file  Social Connections:   . Frequency of Communication with Friends and Family: Not on file  . Frequency of Social Gatherings with Friends and Family: Not on file  . Attends Religious Services: Not on file  . Active Member of Clubs or Organizations: Not on file  . Attends Archivist Meetings: Not on file  . Marital  Status: Not on file  Intimate Partner Violence:   . Fear of Current or Ex-Partner: Not on file  . Emotionally Abused: Not on file  . Physically Abused: Not on file  . Sexually Abused: Not on file    FAMILY HISTORY: Family History  Problem Relation Age of Onset  . Diabetes Mother   . Heart attack Brother   . Cancer Maternal Aunt     ALLERGIES:  is allergic to oxycodone and oxycodone.  MEDICATIONS:  Current Outpatient Medications  Medication Sig Dispense Refill  . albuterol (VENTOLIN HFA) 108 (90 Base) MCG/ACT inhaler Inhale 2 puffs into the lungs every 6 (six) hours as needed for wheezing or shortness of breath.     Marland Kitchen aspirin EC 81 MG tablet Take 81 mg by mouth daily.    Marland Kitchen atorvastatin (LIPITOR) 40 MG tablet Take 40 mg by mouth daily.    . Biotin 5000 MCG TABS Take 2 tablets by mouth daily.     . budesonide-formoterol (SYMBICORT) 160-4.5 MCG/ACT inhaler Inhale 2 puffs into the lungs 2 (two) times daily as needed (for shortness of breath).    . carisoprodol (SOMA) 250 MG tablet Take 250 mg by mouth 2 (two) times daily as needed (cramps).      . Cyanocobalamin (VITAMIN B 12) 500 MCG TABS Take 1,000 mg by mouth every morning.    . donepezil (ARICEPT) 5 MG tablet Take 5 mg by mouth at bedtime.    . furosemide (LASIX) 20 MG tablet Take 20 mg by mouth daily.     . Iron, Ferrous Sulfate, 325 (65 Fe) MG TABS Take 325 mg by mouth daily. Please take 1 pill daily with a source of Vitamin C ( orange juice or 500 units of Vitamin C). 30 tablet 5  . levothyroxine (SYNTHROID) 137 MCG tablet Take 137 mcg by mouth daily before breakfast.    . Magnesium Oxide 420 (252 Mg) MG TABS Take 420 mg by mouth daily.    . montelukast (SINGULAIR) 10 MG tablet Take 10 mg by mouth daily.      . Multiple Vitamins-Minerals (CENTRUM SILVER PO) Take 2 tablets by mouth daily. Gummies    . nitroGLYCERIN (NITROSTAT) 0.4 MG SL tablet Place 0.4 mg under the tongue every 5 (five) minutes as needed for chest pain.    Marland Kitchen omeprazole (PRILOSEC) 40 MG capsule Take 40 mg by mouth daily.     No current facility-administered medications for this visit.   Facility-Administered Medications Ordered in Other Visits  Medication Dose Route Frequency Provider Last Rate Last Admin  . 0.9 %  sodium chloride infusion   Intravenous Continuous Wilford Corner, MD        REVIEW OF SYSTEMS:   Constitutional: ( - ) fevers, ( - )  chills , ( - ) night sweats Eyes: ( - ) blurriness of vision, ( - ) double vision, ( - ) watery eyes Ears, nose, mouth, throat, and face: ( - ) mucositis, ( - ) sore throat Respiratory: ( - ) cough, ( + ) dyspnea, ( - ) wheezes Cardiovascular: ( - ) palpitation, ( - ) chest discomfort, ( - ) lower extremity swelling Gastrointestinal:  ( + ) nausea, ( - ) heartburn, ( - ) change in bowel habits Skin: ( - ) abnormal skin rashes Lymphatics: ( - ) new lymphadenopathy, ( - ) easy bruising Neurological: ( - ) numbness, ( - ) tingling, ( - ) new weaknesses Behavioral/Psych: ( - ) mood change, ( - )  new changes  All other systems were reviewed with the patient and are  negative.  PHYSICAL EXAMINATION: ECOG PERFORMANCE STATUS: 1 - Symptomatic but completely ambulatory  Vitals:   07/27/19 0837  BP: (!) 147/65  Pulse: (!) 102  Resp: 18  Temp: 98.3 F (36.8 C)  SpO2: 97%   Filed Weights   07/27/19 0837  Weight: 190 lb 4.8 oz (86.3 kg)    GENERAL: well appearing elderly African American female in NAD  SKIN: skin color, texture, turgor are normal, no rashes or significant lesions EYES: conjunctiva are pink and non-injected, sclera clear LUNGS: clear to auscultation and percussion with normal breathing effort HEART: regular rate & rhythm and no murmurs and no lower extremity edema Musculoskeletal: no cyanosis of digits and no clubbing  PSYCH: alert & oriented x 3, fluent speech NEURO: no focal motor/sensory deficits  LABORATORY DATA:  I have reviewed the data as listed CBC Latest Ref Rng & Units 04/26/2019 10/03/2016 09/25/2016  WBC 4.0 - 10.5 K/uL 6.1 - 4.1  Hemoglobin 12.0 - 15.0 g/dL 9.3(L) 9.2(L) 9.4(L)  Hematocrit 36.0 - 46.0 % 30.5(L) 27.0(L) 31.2(L)  Platelets 150 - 400 K/uL 289 - 291    CMP Latest Ref Rng & Units 04/26/2019 10/03/2016 09/25/2016  Glucose 70 - 99 mg/dL 96 110(H) 115(H)  BUN 8 - 23 mg/dL 10 - 9  Creatinine 0.44 - 1.00 mg/dL 0.85 - 0.93  Sodium 135 - 145 mmol/L 143 146(H) 145  Potassium 3.5 - 5.1 mmol/L 3.9 3.9 2.7(LL)  Chloride 98 - 111 mmol/L 104 - 103  CO2 22 - 32 mmol/L 31 - 33(H)  Calcium 8.9 - 10.3 mg/dL 9.4 - 9.1  Total Protein 6.5 - 8.1 g/dL 7.3 - -  Total Bilirubin 0.3 - 1.2 mg/dL 0.9 - -  Alkaline Phos 38 - 126 U/L 141(H) - -  AST 15 - 41 U/L 22 - -  ALT 0 - 44 U/L 19 - -    RADIOGRAPHIC STUDIES: None relevant to review.  No results found.  ASSESSMENT & [place] 69 y.o. female with medical history significant for iron deficiency anemia 2/2 to chronic blood loss presents for a follow up visit.  Ms. Vega establish care with [place] in November 2020 and has been taking p.o. iron since that time.   She reports that her symptoms have not improved unfortunately were unable to collect her labs prior to her clinic visit today so we are not able to determine if the iron therapy is been effective.  We are having these labs drawn after her visit to help guide our management.  In the interim I would recommend that she continues the p.o. ferrous sulfide 325 mg daily with a source of vitamin C.  In the event that the patient's iron levels and hemoglobin have not improved with p.o. iron supplementation I will recommend IV iron supplementation with Feraheme 510 mg q. 7 days x 2 doses.  Additionally we intend to clarify her sickle cell status with a hemoglobin electrophoresis today (this is something that she carries on her chart but has not been validated and the patient was unaware of it). We will plan to have her return in 3 months or sooner if IV iron supplementation is required.   #Iron Deficiency Anemia from Chronic Blood Loss --continue PO ferrous sulfide 325mg  daily with source of vitamin C. Patient previously took PO iron, however it was often BID or low elemental iron, which is not recommended.  --source of  blood loss is clearly identified as AVMs in the GI tract.  --transfusion goal for Hgb <7.0.  --can consider IV iron if appropriate PO supplementation does not correct her Hgb --RTC in 3 months to reassess  #Sickle Cell Anemia, reported --patient carries this diagnosis in her chart, though there is no Hgb electrophoresis to confirm this and she is unaware of this diagnosis -- Hgb electrophoresis today to confirm Sickle Cell status   #Bleeding AVM/GI Bleeding --currently followed by [facility] --last EGD on 05/17/2019, showed esophagitis and small non-bleeding duodenal angiodysplasias.  --continue to monitor.   No orders of the defined types were placed in this encounter.  All questions were answered. The patient knows to call the clinic with any problems, questions or concerns.  A total  of more than 20 minutes were spent on this encounter and over half of that time was spent on counseling and coordination of care as outlined above.   Ledell Peoples, MD Department of Hematology/Oncology [place] at [facility] Phone: [phone] Pager: [phone] Email: [email or web address]  07/27/2019 9:37 AM

## 2019-07-27 ENCOUNTER — Inpatient Hospital Stay: Payer: Medicare HMO

## 2019-07-27 ENCOUNTER — Inpatient Hospital Stay: Payer: Medicare HMO | Attending: Hematology and Oncology | Admitting: Hematology and Oncology

## 2019-07-27 ENCOUNTER — Other Ambulatory Visit: Payer: Self-pay

## 2019-07-27 VITALS — BP 147/65 | HR 102 | Temp 98.3°F | Resp 18 | Ht 67.0 in | Wt 190.3 lb

## 2019-07-27 DIAGNOSIS — Z79899 Other long term (current) drug therapy: Secondary | ICD-10-CM | POA: Diagnosis not present

## 2019-07-27 DIAGNOSIS — Z7951 Long term (current) use of inhaled steroids: Secondary | ICD-10-CM | POA: Diagnosis not present

## 2019-07-27 DIAGNOSIS — D500 Iron deficiency anemia secondary to blood loss (chronic): Secondary | ICD-10-CM | POA: Diagnosis not present

## 2019-07-27 DIAGNOSIS — K2100 Gastro-esophageal reflux disease with esophagitis, without bleeding: Secondary | ICD-10-CM | POA: Insufficient documentation

## 2019-07-27 DIAGNOSIS — Z8673 Personal history of transient ischemic attack (TIA), and cerebral infarction without residual deficits: Secondary | ICD-10-CM | POA: Insufficient documentation

## 2019-07-27 DIAGNOSIS — Z7982 Long term (current) use of aspirin: Secondary | ICD-10-CM | POA: Insufficient documentation

## 2019-07-27 DIAGNOSIS — Z87891 Personal history of nicotine dependence: Secondary | ICD-10-CM | POA: Diagnosis not present

## 2019-07-27 DIAGNOSIS — D571 Sickle-cell disease without crisis: Secondary | ICD-10-CM | POA: Diagnosis not present

## 2019-07-27 DIAGNOSIS — E119 Type 2 diabetes mellitus without complications: Secondary | ICD-10-CM | POA: Insufficient documentation

## 2019-07-27 DIAGNOSIS — K5521 Angiodysplasia of colon with hemorrhage: Secondary | ICD-10-CM | POA: Insufficient documentation

## 2019-07-27 LAB — CBC WITH DIFFERENTIAL (CANCER CENTER ONLY)
Abs Immature Granulocytes: 0 10*3/uL (ref 0.00–0.07)
Basophils Absolute: 0 10*3/uL (ref 0.0–0.1)
Basophils Relative: 1 %
Eosinophils Absolute: 0.2 10*3/uL (ref 0.0–0.5)
Eosinophils Relative: 4 %
HCT: 31.5 % — ABNORMAL LOW (ref 36.0–46.0)
Hemoglobin: 9.8 g/dL — ABNORMAL LOW (ref 12.0–15.0)
Immature Granulocytes: 0 %
Lymphocytes Relative: 31 %
Lymphs Abs: 1.8 10*3/uL (ref 0.7–4.0)
MCH: 24.2 pg — ABNORMAL LOW (ref 26.0–34.0)
MCHC: 31.1 g/dL (ref 30.0–36.0)
MCV: 77.8 fL — ABNORMAL LOW (ref 80.0–100.0)
Monocytes Absolute: 0.9 10*3/uL (ref 0.1–1.0)
Monocytes Relative: 15 %
Neutro Abs: 3 10*3/uL (ref 1.7–7.7)
Neutrophils Relative %: 49 %
Platelet Count: 271 10*3/uL (ref 150–400)
RBC: 4.05 MIL/uL (ref 3.87–5.11)
RDW: 15.9 % — ABNORMAL HIGH (ref 11.5–15.5)
WBC Count: 5.9 10*3/uL (ref 4.0–10.5)
nRBC: 0 % (ref 0.0–0.2)

## 2019-07-27 LAB — CMP (CANCER CENTER ONLY)
ALT: 15 U/L (ref 0–44)
AST: 19 U/L (ref 15–41)
Albumin: 3.5 g/dL (ref 3.5–5.0)
Alkaline Phosphatase: 141 U/L — ABNORMAL HIGH (ref 38–126)
Anion gap: 10 (ref 5–15)
BUN: 19 mg/dL (ref 8–23)
CO2: 29 mmol/L (ref 22–32)
Calcium: 9.2 mg/dL (ref 8.9–10.3)
Chloride: 106 mmol/L (ref 98–111)
Creatinine: 0.94 mg/dL (ref 0.44–1.00)
GFR, Est AFR Am: >60 mL/min (ref >60)
GFR, Estimated: >60 mL/min (ref >60)
Glucose, Bld: 111 mg/dL — ABNORMAL HIGH (ref 70–99)
Potassium: 3.8 mmol/L (ref 3.5–5.1)
Sodium: 145 mmol/L (ref 135–145)
Total Bilirubin: 0.9 mg/dL (ref 0.3–1.2)
Total Protein: 7.3 g/dL (ref 6.5–8.1)

## 2019-07-27 LAB — RETIC PANEL
Immature Retic Fract: 16.8 % — ABNORMAL HIGH (ref 2.3–15.9)
RBC.: 4.1 MIL/uL (ref 3.87–5.11)
Retic Count, Absolute: 43.9 10*3/uL (ref 19.0–186.0)
Retic Ct Pct: 1.1 % (ref 0.4–3.1)
Reticulocyte Hemoglobin: 30.1 pg (ref >27.9)

## 2019-07-27 LAB — FERRITIN: Ferritin: 53 ng/mL (ref 11–307)

## 2019-07-27 LAB — IRON AND TIBC
Iron: 42 ug/dL (ref 41–142)
Saturation Ratios: 12 % — ABNORMAL LOW (ref 21–57)
TIBC: 356 ug/dL (ref 236–444)
UIBC: 314 ug/dL (ref 120–384)

## 2019-07-28 ENCOUNTER — Telehealth: Payer: Self-pay | Admitting: Hematology and Oncology

## 2019-07-28 LAB — HEMOGLOBINOPATHY EVALUATION
Hgb A2 Quant: 4.3 % — ABNORMAL HIGH (ref 1.8–3.2)
Hgb A: 59.1 % — ABNORMAL LOW (ref 96.4–98.8)
Hgb C: 0 %
Hgb F Quant: 0 % (ref 0.0–2.0)
Hgb S Quant: 36.6 % — ABNORMAL HIGH
Hgb Variant: 0 %

## 2019-07-28 NOTE — Telephone Encounter (Signed)
Scheduled 5/24 appt per 2/24 los. Pt is aware of new appt date and time.

## 2019-07-29 ENCOUNTER — Telehealth: Payer: Self-pay | Admitting: Hematology and Oncology

## 2019-07-29 NOTE — Telephone Encounter (Signed)
Called to discuss the results of bloodwork from earlier this week. Modest improvement of Hgb on PO iron. At this time I think it would be reasonable to administer IV iron as her levels have no improved at the rate we were expecting.  No answer, left message requesting callback to discuss these findings and the plan moving forward.  Ledell Peoples, MD Department of Hematology/Oncology [place] at [facility] Phone: [phone] Pager: [phone] Email: [email or web address]

## 2019-08-01 ENCOUNTER — Telehealth: Payer: Self-pay | Admitting: Hematology and Oncology

## 2019-08-01 NOTE — Telephone Encounter (Signed)
Mrs. Fairweather returned my prior phone call. We discussed the results of the bloodwork. She was agreeable to starting IV iron therapy and requested Monday as the best time for infusion. We will have her scheduled for Feraheme 510mg  q7days x 2 doses. She voiced her understanding of the plan.  Ledell Peoples, MD Department of Hematology/Oncology [place] at [facility] Phone: [phone] Pager: [phone] Email: [email or web address]

## 2019-08-03 ENCOUNTER — Telehealth: Payer: Self-pay | Admitting: Hematology and Oncology

## 2019-08-03 NOTE — Telephone Encounter (Signed)
Scheduled per sch msg. Called and spoke with patient. Confirmed appt  

## 2019-08-08 ENCOUNTER — Other Ambulatory Visit: Payer: Self-pay

## 2019-08-08 ENCOUNTER — Inpatient Hospital Stay: Payer: Medicare HMO | Attending: Hematology and Oncology

## 2019-08-08 VITALS — BP 128/80 | HR 93 | Temp 98.7°F | Resp 16

## 2019-08-08 DIAGNOSIS — D500 Iron deficiency anemia secondary to blood loss (chronic): Secondary | ICD-10-CM | POA: Insufficient documentation

## 2019-08-08 MED ORDER — SODIUM CHLORIDE 0.9 % IV SOLN
510.0000 mg | Freq: Once | INTRAVENOUS | Status: AC
  Administered 2019-08-08: 510 mg via INTRAVENOUS
  Filled 2019-08-08: qty 510

## 2019-08-08 MED ORDER — SODIUM CHLORIDE 0.9 % IV SOLN
Freq: Once | INTRAVENOUS | Status: AC
  Filled 2019-08-08: qty 250

## 2019-08-08 NOTE — Progress Notes (Signed)
Per Dr. Julien Nordmann, ok to proceed with feraheme with elevated heart rate.

## 2019-08-08 NOTE — Patient Instructions (Signed)

## 2019-08-15 ENCOUNTER — Other Ambulatory Visit: Payer: Self-pay

## 2019-08-15 ENCOUNTER — Inpatient Hospital Stay: Payer: Medicare HMO

## 2019-08-15 VITALS — BP 128/68 | HR 76 | Temp 97.8°F | Resp 18 | Wt 192.2 lb

## 2019-08-15 DIAGNOSIS — D500 Iron deficiency anemia secondary to blood loss (chronic): Secondary | ICD-10-CM | POA: Diagnosis not present

## 2019-08-15 MED ORDER — SODIUM CHLORIDE 0.9 % IV SOLN
510.0000 mg | Freq: Once | INTRAVENOUS | Status: AC
  Administered 2019-08-15: 510 mg via INTRAVENOUS
  Filled 2019-08-15: qty 510

## 2019-08-15 MED ORDER — SODIUM CHLORIDE 0.9 % IV SOLN
Freq: Once | INTRAVENOUS | Status: AC
  Filled 2019-08-15: qty 250

## 2019-08-15 NOTE — Patient Instructions (Signed)

## 2019-08-15 NOTE — Progress Notes (Signed)
Pt. terified of needles. Attempted a 24G at right wrist. Good vein. Pt literally coming out of chair, unable to hold arm still. Good flash, infiltrated on flush. Pt. Does not want to try again. Pt allowed another attempt, which was successful.

## 2019-10-21 ENCOUNTER — Other Ambulatory Visit: Payer: Self-pay | Admitting: Hematology and Oncology

## 2019-10-24 ENCOUNTER — Inpatient Hospital Stay: Payer: Medicare HMO

## 2019-10-24 ENCOUNTER — Inpatient Hospital Stay: Payer: Medicare HMO | Attending: Hematology and Oncology | Admitting: Hematology and Oncology

## 2019-10-24 ENCOUNTER — Other Ambulatory Visit: Payer: Self-pay

## 2019-10-24 ENCOUNTER — Other Ambulatory Visit: Payer: Self-pay | Admitting: Hematology and Oncology

## 2019-10-24 VITALS — BP 140/77 | HR 96 | Temp 97.5°F | Resp 18 | Ht 67.0 in | Wt 189.9 lb

## 2019-10-24 DIAGNOSIS — D500 Iron deficiency anemia secondary to blood loss (chronic): Secondary | ICD-10-CM | POA: Diagnosis not present

## 2019-10-24 DIAGNOSIS — D573 Sickle-cell trait: Secondary | ICD-10-CM

## 2019-10-24 DIAGNOSIS — K5521 Angiodysplasia of colon with hemorrhage: Secondary | ICD-10-CM | POA: Insufficient documentation

## 2019-10-24 DIAGNOSIS — Z809 Family history of malignant neoplasm, unspecified: Secondary | ICD-10-CM | POA: Insufficient documentation

## 2019-10-24 DIAGNOSIS — Z87891 Personal history of nicotine dependence: Secondary | ICD-10-CM | POA: Diagnosis not present

## 2019-10-24 LAB — IRON AND TIBC
Iron: 40 ug/dL — ABNORMAL LOW (ref 41–142)
Saturation Ratios: 16 % — ABNORMAL LOW (ref 21–57)
TIBC: 249 ug/dL (ref 236–444)
UIBC: 208 ug/dL (ref 120–384)

## 2019-10-24 LAB — CBC WITH DIFFERENTIAL (CANCER CENTER ONLY)
Abs Immature Granulocytes: 0.02 10*3/uL (ref 0.00–0.07)
Basophils Absolute: 0 10*3/uL (ref 0.0–0.1)
Basophils Relative: 0 %
Eosinophils Absolute: 0.4 10*3/uL (ref 0.0–0.5)
Eosinophils Relative: 8 %
HCT: 32.2 % — ABNORMAL LOW (ref 36.0–46.0)
Hemoglobin: 10.2 g/dL — ABNORMAL LOW (ref 12.0–15.0)
Immature Granulocytes: 0 %
Lymphocytes Relative: 27 %
Lymphs Abs: 1.4 10*3/uL (ref 0.7–4.0)
MCH: 25.5 pg — ABNORMAL LOW (ref 26.0–34.0)
MCHC: 31.7 g/dL (ref 30.0–36.0)
MCV: 80.5 fL (ref 80.0–100.0)
Monocytes Absolute: 0.7 10*3/uL (ref 0.1–1.0)
Monocytes Relative: 12 %
Neutro Abs: 2.8 10*3/uL (ref 1.7–7.7)
Neutrophils Relative %: 53 %
Platelet Count: 252 10*3/uL (ref 150–400)
RBC: 4 MIL/uL (ref 3.87–5.11)
RDW: 14.7 % (ref 11.5–15.5)
WBC Count: 5.4 10*3/uL (ref 4.0–10.5)
nRBC: 0 % (ref 0.0–0.2)

## 2019-10-24 LAB — CMP (CANCER CENTER ONLY)
ALT: 11 U/L (ref 0–44)
AST: 15 U/L (ref 15–41)
Albumin: 3.3 g/dL — ABNORMAL LOW (ref 3.5–5.0)
Alkaline Phosphatase: 115 U/L (ref 38–126)
Anion gap: 10 (ref 5–15)
BUN: 11 mg/dL (ref 8–23)
CO2: 29 mmol/L (ref 22–32)
Calcium: 9.3 mg/dL (ref 8.9–10.3)
Chloride: 105 mmol/L (ref 98–111)
Creatinine: 0.89 mg/dL (ref 0.44–1.00)
GFR, Est AFR Am: >60 mL/min (ref >60)
GFR, Estimated: >60 mL/min (ref >60)
Glucose, Bld: 110 mg/dL — ABNORMAL HIGH (ref 70–99)
Potassium: 3.3 mmol/L — ABNORMAL LOW (ref 3.5–5.1)
Sodium: 144 mmol/L (ref 135–145)
Total Bilirubin: 1 mg/dL (ref 0.3–1.2)
Total Protein: 7.2 g/dL (ref 6.5–8.1)

## 2019-10-24 LAB — RETIC PANEL
Immature Retic Fract: 9.9 % (ref 2.3–15.9)
RBC.: 4.02 MIL/uL (ref 3.87–5.11)
Retic Count, Absolute: 46.6 10*3/uL (ref 19.0–186.0)
Retic Ct Pct: 1.2 % (ref 0.4–3.1)
Reticulocyte Hemoglobin: 30.4 pg (ref >27.9)

## 2019-10-24 LAB — FERRITIN: Ferritin: 463 ng/mL — ABNORMAL HIGH (ref 11–307)

## 2019-10-24 NOTE — Progress Notes (Signed)
[place] [phone]   [phone]) [phone]  PROGRESS NOTE  Patient Care Team: Rogers Blocker, MD as PCP - General (Internal Medicine)  Hematological/Oncological History # Iron Deficiency Anemia due to Chronic Blood Loss 1) 03/26/2009: Hgb 7.2, first Hgb on record 2) 09/02/2011: 2 units of PRBC at [facility] (Hgb noted at 8.9).  3)  12/03/2011: colonoscopy, found colon polyp and sigmoid diverticula. 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 Underwent argon plasma coagulation  4) Jan 2018: colonoscopy with 2 tubular adenomas removed. Small colonic AVM and small internal hemorrhoids.  5)10/24/2017: Hgb 7.7, MCV 71, Plt 338, and WBC 6.7. POEM procedure for Achalasia. duodenal AVM fulgurated.  6) 03/2018: Hgb 8.2.  7) March 2020: capsule endoscopy: nonbleeding gastric and proximal small bowel AVM,  8) 04/26/2019: Establish Care with Dr. Lorenso Courier  WBC 6.1, Hgb 9.3, MCV 75.3, Plt 289 9) 05/17/2019: Underwent EGD which showed esophagitis and small non-bleeding duodenal angiodysplasias. 10) 07/27/2019: WBC 5.9, Hgb 9.8, MCV 77.8, Plt 271. Recommend IV iron based on labs. Received feraheme 551m IV q7 days x 2 doses on 3/8 and 08/15/2019.   Interval History:  Stuti [id number].o. female with medical history significant for iron deficiency anemia 2/2 to chronic blood loss presents for a follow up visit. The patient's last visit was on 07/27/2019. In the interim since the last visit Mrs. NMilta Deitershad no further interventions, surgeries, or hospitalizations.   On exam today she reports he tolerated the IV iron therapy well.  She notes that it was a very little help and that she still continues to feel tired.  She notes that her shortness of breath and her low energy levels are exactly the same.  She is continue to take the iron pills in the interim and has not had any digestive upset as a result of that.  On 10/18/2019 she also did have follow-up with Dr. SMichail Sermon   On further discussion today she notes that she has been having issues with nosebleeds recently as well.  She notes that when she blows her nose she has been seeing blood in the tissue and that when there is bleeding she occasionally uses a brown bag which she holds under her lip which helps to stanch the bleeding.  She notes that occasionally she does see blood in the stool on occasion and does endorse continued to have tar-like stools.  She denies any bruising or other overt signs of bleeding.  A full 10 point ROS is listed below.  MEDICAL HISTORY:  Past Medical History:  Diagnosis Date  . Anemia   . Anxiety   . Asthma   . Asthma   . Bronchitis    hx  . Colon polyps    hyperplastic  . Diabetes ([place]    mild  . Diabetes mellitus   . DJD (degenerative joint disease), cervical   . Dysphagia    history of  . Dyspnea   . Gallstones   . GERD (gastroesophageal reflux disease)   . H/O chest pain    secondary to anemia  . Headache   . History of arteriovenous malformation (AVM)   . History of hiatal hernia   . History of tobacco use   . Hyperlipemia   . Hypertension   . Hypothyroidism   . Iron deficiency anemia   . Sickle cell anemia (HCC)    "patient is not aware of this"  . Thyroid disease   . TIA (transient ischemic  attack)    "was told that she possible had one"    SURGICAL HISTORY: Past Surgical History:  Procedure Laterality Date  . ABDOMINAL HYSTERECTOMY    . ANTERIOR CERVICAL DECOMP/DISCECTOMY FUSION N/A 10/03/2016   Procedure: ANTERIOR CERVICAL DECOMPRESSION/DISCECTOMY FUSION CERVICAL THREE- CERVICAL FOUR;  Surgeon: Ashok Pall, MD;  Location: [place];  Service: Neurosurgery;  Laterality: N/A;  Right side approach  . BOTOX INJECTION  08/29/2015   Procedure: BOTOX INJECTION;  Surgeon: Wilford Corner, MD;  Location: [place];  Service: Endoscopy;;  . CHOLECYSTECTOMY    . COLONOSCOPY  12/03/2011   Procedure: COLONOSCOPY;  Surgeon: [name], MD;   Location: [facility];  Service: Endoscopy;  Laterality: N/A;  . ESOPHAGEAL MANOMETRY N/A 07/13/2017   Procedure: ESOPHAGEAL MANOMETRY (EM);  Surgeon: Wilford Corner, MD;  Location: [facility];  Service: Endoscopy;  Laterality: N/A;  . ESOPHAGOGASTRODUODENOSCOPY  12/03/2011   Procedure: ESOPHAGOGASTRODUODENOSCOPY (EGD);  Surgeon: [name], MD;  Location: [facility];  Service: Endoscopy;  Laterality: N/A;  . ESOPHAGOGASTRODUODENOSCOPY (EGD) WITH PROPOFOL N/A 08/29/2015   Procedure: ESOPHAGOGASTRODUODENOSCOPY (EGD) WITH PROPOFOL;  Surgeon: Wilford Corner, MD;  [facility];  Service: Endoscopy;  Laterality: N/A;  . ESOPHAGOGASTRODUODENOSCOPY (EGD) WITH PROPOFOL N/A 05/17/2019   Procedure: ESOPHAGOGASTRODUODENOSCOPY (EGD) WITH PROPOFOL;  Surgeon: Wilford Corner, MD;  Location: [facility];  Service: Endoscopy;  Laterality: N/A;  . GIVENS CAPSULE STUDY N/A 08/05/2018   Procedure: GIVENS CAPSULE STUDY;  Surgeon: Wilford Corner, MD;  Location: [place];  Service: Endoscopy;  Laterality: N/A;  . HOT HEMOSTASIS  12/03/2011   Procedure: HOT HEMOSTASIS (ARGON PLASMA COAGULATION/BICAP);  Surgeon: [name], MD;  Location: [facility];  Service: Endoscopy;  Laterality: N/A;  . NECK SURGERY    . ROTATOR CUFF REPAIR     right  . ROTATOR CUFF REPAIR Right 2014  . TONSILLECTOMY      SOCIAL HISTORY: Social History   Socioeconomic History  . Marital status: Divorced    Spouse name: Not on file  . Number of children: 2  . Years of education: Not on file  . Highest education level: Not on file  Occupational History  . Occupation: group Designer, multimedia: [employer]    Comment: [employer]  Tobacco Use  . Smoking status: Former Smoker    Types: Cigarettes    Quit date: 06/02/2000    Years since quitting: 19.4  . Smokeless tobacco: Never Used  Substance and Sexual Activity  . Alcohol use: No  . Drug use: No  .  Sexual activity: Not on file  Other Topics Concern  . Not on file  Social History Narrative   ** Merged History Encounter **       Social Determinants of Health   Financial Resource Strain:   . Difficulty of Paying Living Expenses:   Food Insecurity:   . Worried About Charity fundraiser in the Last Year:   . Arboriculturist in the Last Year:   Transportation Needs:   . Film/video editor (Medical):   Marland Kitchen Lack of Transportation (Non-Medical):   Physical Activity:   . Days of Exercise per Week:   . Minutes of Exercise per Session:   Stress:   . Feeling of Stress :   Social Connections:   . Frequency of Communication with Friends and Family:   . Frequency of Social Gatherings with Friends and Family:   . Attends Religious Services:   .  Active Member of Clubs or Organizations:   . Attends Archivist Meetings:   Marland Kitchen Marital Status:   Intimate Partner Violence:   . Fear of Current or Ex-Partner:   . Emotionally Abused:   Marland Kitchen Physically Abused:   . Sexually Abused:     FAMILY HISTORY: Family History  Problem Relation Age of Onset  . Diabetes Mother   . Heart attack Brother   . Cancer Maternal Aunt     ALLERGIES:  is allergic to oxycodone and oxycodone.  MEDICATIONS:  Current Outpatient Medications  Medication Sig Dispense Refill  . albuterol (VENTOLIN HFA) 108 (90 Base) MCG/ACT inhaler Inhale 2 puffs into the lungs every 6 (six) hours as needed for wheezing or shortness of breath.     Marland Kitchen aspirin EC 81 MG tablet Take 81 mg by mouth daily.    Marland Kitchen atorvastatin (LIPITOR) 40 MG tablet Take 40 mg by mouth daily.    . Biotin 5000 MCG TABS Take 2 tablets by mouth daily.     . budesonide-formoterol (SYMBICORT) 160-4.5 MCG/ACT inhaler Inhale 2 puffs into the lungs 2 (two) times daily as needed (for shortness of breath).    . carisoprodol (SOMA) 250 MG tablet Take 250 mg by mouth 2 (two) times daily as needed (cramps).     . [employer] IRON 325 (65 Fe) MG tablet TAKE 1 TABLET  EVERY DAY . PLEASE TAKE 1 PILL DAILY WITH ORANGE JUICE OR 500 UNITS OF VITAMIN C 90 tablet 1  . Cyanocobalamin (VITAMIN B 12) 500 MCG TABS Take 1,000 mg by mouth every morning.    . donepezil (ARICEPT) 5 MG tablet Take 5 mg by mouth at bedtime.    . furosemide (LASIX) 20 MG tablet Take 20 mg by mouth daily.     Marland Kitchen levothyroxine (SYNTHROID) 137 MCG tablet Take 137 mcg by mouth daily before breakfast.    . Magnesium Oxide 420 (252 Mg) MG TABS Take 420 mg by mouth daily.    . montelukast (SINGULAIR) 10 MG tablet Take 10 mg by mouth daily.      . Multiple Vitamins-Minerals (CENTRUM SILVER PO) Take 2 tablets by mouth daily. Gummies    . nitroGLYCERIN (NITROSTAT) 0.4 MG SL tablet Place 0.4 mg under the tongue every 5 (five) minutes as needed for chest pain.    Marland Kitchen omeprazole (PRILOSEC) 40 MG capsule Take 40 mg by mouth daily.     No current facility-administered medications for this visit.   Facility-Administered Medications Ordered in Other Visits  Medication Dose Route Frequency Provider Last Rate Last Admin  . 0.9 %  sodium chloride infusion   Intravenous Continuous Wilford Corner, MD        REVIEW OF SYSTEMS:   Constitutional: ( - ) fevers, ( - )  chills , ( - ) night sweats (+) fatigue Eyes: ( - ) blurriness of vision, ( - ) double vision, ( - ) watery eyes Ears, nose, mouth, throat, and face: ( - ) mucositis, ( - ) sore throat Respiratory: ( - ) cough, ( + ) dyspnea, ( - ) wheezes Cardiovascular: ( - ) palpitation, ( - ) chest discomfort, ( - ) lower extremity swelling Gastrointestinal:  ( - ) nausea, ( - ) heartburn, ( - ) change in bowel habits Skin: ( - ) abnormal skin rashes Lymphatics: ( - ) new lymphadenopathy, ( - ) easy bruising Neurological: ( - ) numbness, ( - ) tingling, ( - ) new weaknesses Behavioral/Psych: ( - ) mood  change, ( - ) new changes  All other systems were reviewed with the patient and are negative.  PHYSICAL EXAMINATION: ECOG PERFORMANCE STATUS: 1 -  Symptomatic but completely ambulatory  Vitals:   10/24/19 0853  BP: 140/77  Pulse: 96  Resp: 18  Temp: (!) 97.5 F (36.4 C)  SpO2: 94%   Filed Weights   10/24/19 0853  Weight: 189 lb 14.4 oz (86.1 kg)    GENERAL: well appearing elderly African American female in NAD  SKIN: skin color, texture, turgor are normal, no rashes or significant lesions EYES: conjunctiva are pink and non-injected, sclera clear LUNGS: clear to auscultation and percussion with normal breathing effort HEART: regular rate & rhythm and no murmurs and no lower extremity edema Musculoskeletal: no cyanosis of digits and no clubbing  PSYCH: alert & oriented x 3, fluent speech NEURO: no focal motor/sensory deficits  LABORATORY DATA:  I have reviewed the data as listed CBC Latest Ref Rng & Units 10/24/2019 07/27/2019 04/26/2019  WBC 4.0 - 10.5 K/uL 5.4 5.9 6.1  Hemoglobin 12.0 - 15.0 g/dL 10.2(L) 9.8(L) 9.3(L)  Hematocrit 36.0 - 46.0 % 32.2(L) 31.5(L) 30.5(L)  Platelets 150 - 400 K/uL 252 271 289    CMP Latest Ref Rng & Units 10/24/2019 07/27/2019 04/26/2019  Glucose 70 - 99 mg/dL 110(H) 111(H) 96  BUN 8 - 23 mg/dL '11 19 10  '$ Creatinine 0.44 - 1.00 mg/dL 0.89 0.94 0.85  Sodium 135 - 145 mmol/L 144 145 143  Potassium 3.5 - 5.1 mmol/L 3.3(L) 3.8 3.9  Chloride 98 - 111 mmol/L 105 106 104  CO2 22 - 32 mmol/L '29 29 31  '$ Calcium 8.9 - 10.3 mg/dL 9.3 9.2 9.4  Total Protein 6.5 - 8.1 g/dL 7.2 7.3 7.3  Total Bilirubin 0.3 - 1.2 mg/dL 1.0 0.9 0.9  Alkaline Phos 38 - 126 U/L 115 141(H) 141(H)  AST 15 - 41 U/L '15 19 22  '$ ALT 0 - 44 U/L '11 15 19    '$ RADIOGRAPHIC STUDIES: None relevant to review.  No results found.  ASSESSMENT & [place] 69 y.o. female with medical history significant for iron deficiency anemia 2/2 to chronic blood loss presents for a follow up visit.  Ms. Cahoon establish care with [place] in November 2020 and has been taking p.o. iron since that time.  When her levels failed to improve at  her last visit she was started on IV Iron therapy and received IV feraheme '510mg'$  x 2 doses on 3/8 and 08/15/19.   Labs today unfortunately show a very modest increase in the hemoglobin from 9.8 on her last visit to 10.2 today.  Iron studies are still pending at this time, though would expect with the IV Feraheme the patient would have substantially increased iron levels.  In the event her iron levels are fully replete we would need to consider further evaluation up to including bone marrow biopsy in order to assure that there are no alternative etiologies.  In the event that the patient continues to have low iron levels it is likely that her GI bleed has persisted and that is the reason why her hemoglobin levels have not risen more markedly with the increased iron supplementation.  We will plan to have Ms. Stecher back in approximately 3 months time in order to reassess or sooner if she were to develop new symptoms or other concerning findings were noted on her pending labs.  #Iron Deficiency Anemia from Chronic Blood Loss --continue PO ferrous sulfide '325mg'$   daily with source of vitamin C. Patient previously took PO iron, however it was often BID or low elemental iron, which is not recommended.  --source of blood loss is clearly identified as AVMs in the GI tract.  --transfusion goal for Hgb <7.0.  --patient received IV feraheme '510mg'$  x2 dose on 3/8-3/15/21. Iron levels from today pending.  --RTC in 3 months to reassess  #Sickle Cell Trait -- Hgb electrophoresis confirmed sickle cell trait on 07/27/2019.   #Bleeding AVM/GI Bleeding --currently followed by [facility] --last EGD on 05/17/2019, showed esophagitis and small non-bleeding duodenal angiodysplasias.  --continue to monitor.   No orders of the defined types were placed in this encounter.  All questions were answered. The patient knows to call the clinic with any problems, questions or concerns.  A total of more than 30 minutes were spent on  this encounter and over half of that time was spent on counseling and coordination of care as outlined above.   Ledell Peoples, MD Department of Hematology/Oncology [place] at [facility] Phone: [phone] Pager: [phone] Email: [email or web address]  10/24/2019 8:55 AM

## 2019-10-29 ENCOUNTER — Encounter: Payer: Self-pay | Admitting: Hematology and Oncology

## 2019-11-04 ENCOUNTER — Telehealth: Payer: Self-pay | Admitting: Hematology and Oncology

## 2019-11-04 NOTE — Telephone Encounter (Signed)
Scheduled per sch msg. Called and left msg. Mailed printout  

## 2020-02-02 ENCOUNTER — Other Ambulatory Visit: Payer: Self-pay | Admitting: Hematology and Oncology

## 2020-02-02 DIAGNOSIS — D500 Iron deficiency anemia secondary to blood loss (chronic): Secondary | ICD-10-CM

## 2020-02-02 NOTE — Progress Notes (Deleted)
[place] Telephone:([phone]) [phone]   Fax:([phone]) [phone]  PROGRESS NOTE  Patient Care Team: Rogers Blocker, MD as PCP - General (Internal Medicine)  Hematological/Oncological History # Iron Deficiency Anemia due to Chronic Blood Loss 1) 03/26/2009: Hgb 7.2, first Hgb on record 2) 09/02/2011: 2 units of PRBC at [facility] (Hgb noted at 8.9).  3)  12/03/2011: colonoscopy, found colon polyp and sigmoid diverticula. 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 Underwent argon plasma coagulation  4) Jan 2018: colonoscopy with 2 tubular adenomas removed. Small colonic AVM and small internal hemorrhoids.  5)10/24/2017: Hgb 7.7, MCV 71, Plt 338, and WBC 6.7. POEM procedure for Achalasia. duodenal AVM fulgurated.  6) 03/2018: Hgb 8.2.  7) March 2020: capsule endoscopy: nonbleeding gastric and proximal small bowel AVM,  8) 04/26/2019: Establish Care with Dr. Lorenso Courier  WBC 6.1, Hgb 9.3, MCV 75.3, Plt 289 9) 05/17/2019: Underwent EGD which showed esophagitis and small non-bleeding duodenal angiodysplasias. 10) 07/27/2019: WBC 5.9, Hgb 9.8, MCV 77.8, Plt 271. Recommend IV iron based on labs. Received feraheme 551m IV q7 days x 2 doses on 3/8 and 08/15/2019.   Interval History:  Lacey BBavaria69y.o. female with medical history significant for iron deficiency anemia 2/2 to chronic blood loss presents for a follow up visit. The patient's last visit was on 07/27/2019. In the interim since the last visit Mrs. NMilta Deitershad no further interventions, surgeries, or hospitalizations.   On exam today she reports he tolerated the IV iron therapy well.  She notes that it was a very little help and that she still continues to feel tired.  She notes that her shortness of breath and her low energy levels are exactly the same.  She is continue to take the iron pills in the interim and has not had any digestive upset as a result of that.  On 10/18/2019 she also did have follow-up with Dr. SMichail Sermon   On further discussion today she notes that she has been having issues with nosebleeds recently as well.  She notes that when she blows her nose she has been seeing blood in the tissue and that when there is bleeding she occasionally uses a brown bag which she holds under her lip which helps to stanch the bleeding.  She notes that occasionally she does see blood in the stool on occasion and does endorse continued to have tar-like stools.  She denies any bruising or other overt signs of bleeding.  A full 10 point ROS is listed below.  MEDICAL HISTORY:  Past Medical History:  Diagnosis Date  . Anemia   . Anxiety   . Asthma   . Asthma   . Bronchitis    hx  . Colon polyps    hyperplastic  . Diabetes ([place]    mild  . Diabetes mellitus   . DJD (degenerative joint disease), cervical   . Dysphagia    history of  . Dyspnea   . Gallstones   . GERD (gastroesophageal reflux disease)   . H/O chest pain    secondary to anemia  . Headache   . History of arteriovenous malformation (AVM)   . History of hiatal hernia   . History of tobacco use   . Hyperlipemia   . Hypertension   . Hypothyroidism   . Iron deficiency anemia   . Sickle cell anemia (HCC)    "patient is not aware of this"  . Thyroid disease   . TIA (transient ischemic  attack)    "was told that she possible had one"    SURGICAL HISTORY: Past Surgical History:  Procedure Laterality Date  . ABDOMINAL HYSTERECTOMY    . ANTERIOR CERVICAL DECOMP/DISCECTOMY FUSION N/A 10/03/2016   Procedure: ANTERIOR CERVICAL DECOMPRESSION/DISCECTOMY FUSION CERVICAL THREE- CERVICAL FOUR;  Surgeon: Ashok Pall, MD;  Location: [facility];  Service: Neurosurgery;  Laterality: N/A;  Right side approach  . BOTOX INJECTION  08/29/2015   Procedure: BOTOX INJECTION;  Surgeon: Wilford Corner, MD;  Location: [place];  Service: Endoscopy;;  . CHOLECYSTECTOMY    . COLONOSCOPY  12/03/2011   Procedure: COLONOSCOPY;  Surgeon: [name], MD;   Location: [facility];  Service: Endoscopy;  Laterality: N/A;  . ESOPHAGEAL MANOMETRY N/A 07/13/2017   Procedure: ESOPHAGEAL MANOMETRY (EM);  Surgeon: Wilford Corner, MD;  Location: [facility];  Service: Endoscopy;  Laterality: N/A;  . ESOPHAGOGASTRODUODENOSCOPY  12/03/2011   Procedure: ESOPHAGOGASTRODUODENOSCOPY (EGD);  Surgeon: [name], MD;  Location: [facility];  Service: Endoscopy;  Laterality: N/A;  . ESOPHAGOGASTRODUODENOSCOPY (EGD) WITH PROPOFOL N/A 08/29/2015   Procedure: ESOPHAGOGASTRODUODENOSCOPY (EGD) WITH PROPOFOL;  Surgeon: Wilford Corner, MD;  Location[facility];  Service: Endoscopy;  Laterality: N/A;  . ESOPHAGOGASTRODUODENOSCOPY (EGD) WITH PROPOFOL N/A 05/17/2019   Procedure: ESOPHAGOGASTRODUODENOSCOPY (EGD) WITH PROPOFOL;  Surgeon: Wilford Corner, MD;  Location: [facility];  Service: Endoscopy;  Laterality: N/A;  . GIVENS CAPSULE STUDY N/A 08/05/2018   Procedure: GIVENS CAPSULE STUDY;  Surgeon: Wilford Corner, MD;  Location: [place];  Service: Endoscopy;  Laterality: N/A;  . HOT HEMOSTASIS  12/03/2011   Procedure: HOT HEMOSTASIS (ARGON PLASMA COAGULATION/BICAP);  Surgeon: [name], MD;  Location: [facility];  Service: Endoscopy;  Laterality: N/A;  . NECK SURGERY    . ROTATOR CUFF REPAIR     right  . ROTATOR CUFF REPAIR Right 2014  . TONSILLECTOMY      SOCIAL HISTORY: Social History   Socioeconomic History  . Marital status: Divorced    Spouse name: Not on file  . Number of children: 2  . Years of education: Not on file  . Highest education level: Not on file  Occupational History  . Occupation: group Designer, multimedia: [employer]    Comment: [employer]  Tobacco Use  . Smoking status: Former Smoker    Types: Cigarettes    Quit date: 06/02/2000    Years since quitting: 19.6  . Smokeless tobacco: Never Used  Vaping Use  . Vaping Use: Never used  Substance and Sexual Activity  .  Alcohol use: No  . Drug use: No  . Sexual activity: Not on file  Other Topics Concern  . Not on file  Social History Narrative   ** Merged History Encounter **       Social Determinants of Health   Financial Resource Strain:   . Difficulty of Paying Living Expenses: Not on file  Food Insecurity:   . Worried About Charity fundraiser in the Last Year: Not on file  . Ran Out of Food in the Last Year: Not on file  Transportation Needs:   . Lack of Transportation (Medical): Not on file  . Lack of Transportation (Non-Medical): Not on file  Physical Activity:   . Days of Exercise per Week: Not on file  . Minutes of Exercise per Session: Not on file  Stress:   . Feeling of Stress : Not on file  Social Connections:   . Frequency of  Communication with Friends and Family: Not on file  . Frequency of Social Gatherings with Friends and Family: Not on file  . Attends Religious Services: Not on file  . Active Member of Clubs or Organizations: Not on file  . Attends Archivist Meetings: Not on file  . Marital Status: Not on file  Intimate Partner Violence:   . Fear of Current or Ex-Partner: Not on file  . Emotionally Abused: Not on file  . Physically Abused: Not on file  . Sexually Abused: Not on file    FAMILY HISTORY: Family History  Problem Relation Age of Onset  . Diabetes Mother   . Heart attack Brother   . Cancer Maternal Aunt     ALLERGIES:  is allergic to oxycodone and oxycodone.  MEDICATIONS:  Current Outpatient Medications  Medication Sig Dispense Refill  . albuterol (VENTOLIN HFA) 108 (90 Base) MCG/ACT inhaler Inhale 2 puffs into the lungs every 6 (six) hours as needed for wheezing or shortness of breath.     Marland Kitchen aspirin EC 81 MG tablet Take 81 mg by mouth daily.    Marland Kitchen atorvastatin (LIPITOR) 40 MG tablet Take 40 mg by mouth daily.    . Biotin 5000 MCG TABS Take 2 tablets by mouth daily.     . budesonide-formoterol (SYMBICORT) 160-4.5 MCG/ACT inhaler Inhale 2  puffs into the lungs 2 (two) times daily as needed (for shortness of breath).    . carisoprodol (SOMA) 250 MG tablet Take 250 mg by mouth 2 (two) times daily as needed (cramps).     . Cholecalciferol (D3) 50 MCG (2000 UT) TABS Take 1 tablet by mouth daily.    . [employer] IRON 325 (65 Fe) MG tablet TAKE 1 TABLET EVERY DAY . PLEASE TAKE 1 PILL DAILY WITH ORANGE JUICE OR 500 UNITS OF VITAMIN C 90 tablet 1  . Cyanocobalamin (VITAMIN B 12) 500 MCG TABS Take 1,000 mg by mouth every morning.    . docusate sodium (COLACE) 100 MG capsule Take 100 mg by mouth daily.    Marland Kitchen donepezil (ARICEPT) 5 MG tablet Take 5 mg by mouth at bedtime.    . furosemide (LASIX) 20 MG tablet Take 20 mg by mouth daily.     Marland Kitchen levothyroxine (SYNTHROID) 137 MCG tablet Take 137 mcg by mouth daily before breakfast.    . Magnesium Oxide 420 (252 Mg) MG TABS Take 420 mg by mouth daily.    . montelukast (SINGULAIR) 10 MG tablet Take 10 mg by mouth daily.      . Multiple Vitamins-Minerals (CENTRUM SILVER PO) Take 2 tablets by mouth daily. Gummies    . nitroGLYCERIN (NITROSTAT) 0.4 MG SL tablet Place 0.4 mg under the tongue every 5 (five) minutes as needed for chest pain.    Marland Kitchen omeprazole (PRILOSEC) 40 MG capsule Take 40 mg by mouth daily.    . vitamin E 180 MG (400 UNITS) capsule Take 400 Units by mouth daily.     No current facility-administered medications for this visit.   Facility-Administered Medications Ordered in Other Visits  Medication Dose Route Frequency Provider Last Rate Last Admin  . 0.9 %  sodium chloride infusion   Intravenous Continuous Wilford Corner, MD        REVIEW OF SYSTEMS:   Constitutional: ( - ) fevers, ( - )  chills , ( - ) night sweats (+) fatigue Eyes: ( - ) blurriness of vision, ( - ) double vision, ( - ) watery eyes Ears, nose, mouth,  throat, and face: ( - ) mucositis, ( - ) sore throat Respiratory: ( - ) cough, ( + ) dyspnea, ( - ) wheezes Cardiovascular: ( - ) palpitation, ( - ) chest discomfort, ( - )  lower extremity swelling Gastrointestinal:  ( - ) nausea, ( - ) heartburn, ( - ) change in bowel habits Skin: ( - ) abnormal skin rashes Lymphatics: ( - ) new lymphadenopathy, ( - ) easy bruising Neurological: ( - ) numbness, ( - ) tingling, ( - ) new weaknesses Behavioral/Psych: ( - ) mood change, ( - ) new changes  All other systems were reviewed with the patient and are negative.  PHYSICAL EXAMINATION: ECOG PERFORMANCE STATUS: 1 - Symptomatic but completely ambulatory  There were no vitals filed for this visit. There were no vitals filed for this visit.  GENERAL: well appearing elderly African American female in NAD  SKIN: skin color, texture, turgor are normal, no rashes or significant lesions EYES: conjunctiva are pink and non-injected, sclera clear LUNGS: clear to auscultation and percussion with normal breathing effort HEART: regular rate & rhythm and no murmurs and no lower extremity edema Musculoskeletal: no cyanosis of digits and no clubbing  PSYCH: alert & oriented x 3, fluent speech NEURO: no focal motor/sensory deficits  LABORATORY DATA:  I have reviewed the data as listed CBC Latest Ref Rng & Units 10/24/2019 07/27/2019 04/26/2019  WBC 4.0 - 10.5 K/uL 5.4 5.9 6.1  Hemoglobin 12.0 - 15.0 g/dL 10.2(L) 9.8(L) 9.3(L)  Hematocrit 36 - 46 % 32.2(L) 31.5(L) 30.5(L)  Platelets 150 - 400 K/uL 252 271 289    CMP Latest Ref Rng & Units 10/24/2019 07/27/2019 04/26/2019  Glucose 70 - 99 mg/dL 110(H) 111(H) 96  BUN 8 - 23 mg/dL _0 Creatinine 0.44 - 1.00 mg/dL 0.89 0.94 0.85  Sodium 135 - 145 mmol/L 144 145 143  Potassium 3.5 - 5.1 mmol/L 3.3(L) 3.8 3.9  Chloride 98 - 111 mmol/L 105 106 104  CO2 22 - 32 mmol/L _1 Calcium 8.9 - 10.3 mg/dL 9.3 9.2 9.4  Total Protein 6.5 - 8.1 g/dL 7.2 7.3 7.3  Total Bilirubin 0.3 - 1.2 mg/dL 1.0 0.9 0.9  Alkaline Phos 38 - 126 U/L 115 141(H) 141(H)  AST 15 - 41 U/L _2 ALT 0 - 44 U/L _3 RADIOGRAPHIC STUDIES: None  relevant to review.  No results found.  ASSESSMENT & Macy 69 y.o. female with medical history significant for iron deficiency anemia 2/2 to chronic blood loss presents for a follow up visit.  Lacey Vega establish care with [place] in November 2020 and has been taking p.o. iron since that time.  When her levels failed to improve at her last visit she was started on IV Iron therapy and received IV feraheme 545m x 2 doses on 3/8 and 08/15/19.   Labs today unfortunately show a very modest increase in the hemoglobin from 9.8 on her last visit to 10.2 today.  Iron studies are still pending at this time, though would expect with the IV Feraheme the patient would have substantially increased iron levels.  In the event her iron levels are fully replete we would need to consider further evaluation up to including bone marrow biopsy in order to assure that there are no alternative etiologies.  In the event that the patient continues to have low iron levels it is likely that her GI bleed has persisted  and that is the reason why her hemoglobin levels have not risen more markedly with the increased iron supplementation.  We will plan to have Lacey Vega back in approximately 3 months time in order to reassess or sooner if she were to develop new symptoms or other concerning findings were noted on her pending labs.  #Iron Deficiency Anemia from Chronic Blood Loss --continue PO ferrous sulfide 364m daily with source of vitamin C. Patient previously took PO iron, however it was often BID or low elemental iron, which is not recommended.  --source of blood loss is clearly identified as AVMs in the GI tract.  --transfusion goal for Hgb <7.0.  --patient received IV feraheme [id number] dose on 3/8-3/15/21. Iron levels from today pending.  --RTC in 3 months to reassess  #Sickle Cell Trait -- Hgb electrophoresis confirmed sickle cell trait on 07/27/2019.   #Bleeding AVM/GI Bleeding --currently followed by  [facility] --last EGD on 05/17/2019, showed esophagitis and small non-bleeding duodenal angiodysplasias.  --continue to monitor.   No orders of the defined types were placed in this encounter.  All questions were answered. The patient knows to call the clinic with any problems, questions or concerns.  A total of more than 30 minutes were spent on this encounter and over half of that time was spent on counseling and coordination of care as outlined above.   JoLedell PeoplesMD Department of Hematology/Oncology CoWartburgt [facility] Campushone: [phone]: [phone]: [email or web address]  02/02/2020 5:25 PM

## 2020-02-03 ENCOUNTER — Inpatient Hospital Stay: Payer: Medicare HMO | Attending: Hematology and Oncology | Admitting: Hematology and Oncology

## 2020-02-03 ENCOUNTER — Inpatient Hospital Stay: Payer: Medicare HMO

## 2020-02-03 DIAGNOSIS — I10 Essential (primary) hypertension: Secondary | ICD-10-CM | POA: Insufficient documentation

## 2020-02-03 DIAGNOSIS — E039 Hypothyroidism, unspecified: Secondary | ICD-10-CM | POA: Insufficient documentation

## 2020-02-03 DIAGNOSIS — E785 Hyperlipidemia, unspecified: Secondary | ICD-10-CM | POA: Insufficient documentation

## 2020-02-03 DIAGNOSIS — Z87891 Personal history of nicotine dependence: Secondary | ICD-10-CM | POA: Insufficient documentation

## 2020-02-03 DIAGNOSIS — E119 Type 2 diabetes mellitus without complications: Secondary | ICD-10-CM | POA: Insufficient documentation

## 2020-02-03 DIAGNOSIS — Q2733 Arteriovenous malformation of digestive system vessel: Secondary | ICD-10-CM | POA: Insufficient documentation

## 2020-02-03 DIAGNOSIS — Z90710 Acquired absence of both cervix and uterus: Secondary | ICD-10-CM | POA: Insufficient documentation

## 2020-02-03 DIAGNOSIS — D573 Sickle-cell trait: Secondary | ICD-10-CM | POA: Insufficient documentation

## 2020-02-03 DIAGNOSIS — Z8673 Personal history of transient ischemic attack (TIA), and cerebral infarction without residual deficits: Secondary | ICD-10-CM | POA: Insufficient documentation

## 2020-02-03 DIAGNOSIS — Z7982 Long term (current) use of aspirin: Secondary | ICD-10-CM | POA: Insufficient documentation

## 2020-02-03 DIAGNOSIS — Z79899 Other long term (current) drug therapy: Secondary | ICD-10-CM | POA: Insufficient documentation

## 2020-02-03 DIAGNOSIS — D500 Iron deficiency anemia secondary to blood loss (chronic): Secondary | ICD-10-CM | POA: Insufficient documentation

## 2020-02-14 NOTE — Progress Notes (Signed)
[place] [phone]   [phone]  PROGRESS NOTE  Patient Care Team: Rogers Blocker, MD as PCP - General (Internal Medicine)  Hematological/Oncological History # Iron Deficiency Anemia due to Chronic Blood Loss 1) 03/26/2009: Hgb 7.2, first Hgb on record 2) 09/02/2011: 2 units of PRBC at [facility] (Hgb noted at 8.9).  3)  12/03/2011: colonoscopy, found colon polyp and sigmoid diverticula. 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 Underwent argon plasma coagulation  4) Jan 2018: colonoscopy with 2 tubular adenomas removed. Small colonic AVM and small internal hemorrhoids.  5)10/24/2017: Hgb 7.7, MCV 71, Plt 338, and WBC 6.7. POEM procedure for Achalasia. duodenal AVM fulgurated.  6) 03/2018: Hgb 8.2.  7) March 2020: capsule endoscopy: nonbleeding gastric and proximal small bowel AVM,  8) 04/26/2019: Establish Care with Dr. Lorenso Courier  WBC 6.1, Hgb 9.3, MCV 75.3, Plt 289 9) 05/17/2019: Underwent EGD which showed esophagitis and small non-bleeding duodenal angiodysplasias. 10) 07/27/2019: WBC 5.9, Hgb 9.8, MCV 77.8, Plt 271. Recommend IV iron based on labs. Received feraheme 510mg  IV q7 days x 2 doses on 3/8 and 08/15/2019.   Interval History:  Lacey Vega 69 y.o. female with medical history significant for iron deficiency anemia 2/2 to chronic blood loss presents for a follow up visit. The patient's last visit was on 10/24/2019. In the interim since the last visit [name] had no further interventions, surgeries, or hospitalizations.   On exam today Mrs. Witts notes she has been doing well since her last visit.  She notes that she has been taking her iron pills every day without missed doses and has been tolerating it well with no complaints of constipation or abdominal pain.  She notes that she has not noticed any boost in her energy level since starting this medication.  She still reports that she is tired all the time and she remains tired  even after a full night sleep.  She does have occasional nosebleeds at which time it feels like her nose is running.  She notes that she is able to stop it with typically 1 tissue.  She notes that this is occurred approximately 3 times since her last visit.  She denies having any other overt signs of bleeding, dark stools, chest pain, or shortness of breath.  Overall she remains at her baseline level of health.  Full 10 point ROS is listed below.  MEDICAL HISTORY:  Past Medical History:  Diagnosis Date  . Anemia   . Anxiety   . Asthma   . Asthma   . Bronchitis    hx  . Colon polyps    hyperplastic  . Diabetes ([place])    mild  . Diabetes mellitus   . DJD (degenerative joint disease), cervical   . Dysphagia    history of  . Dyspnea   . Gallstones   . GERD (gastroesophageal reflux disease)   . H/O chest pain    secondary to anemia  . Headache   . History of arteriovenous malformation (AVM)   . History of hiatal hernia   . History of tobacco use   . Hyperlipemia   . Hypertension   . Hypothyroidism   . Iron deficiency anemia   . Sickle cell anemia (HCC)    "patient is not aware of this"  . Thyroid disease   . TIA (transient ischemic attack)    "was told that she possible had one"    SURGICAL HISTORY: Past Surgical History:  Procedure Laterality Date  . ABDOMINAL HYSTERECTOMY    . ANTERIOR CERVICAL DECOMP/DISCECTOMY FUSION N/A 10/03/2016   Procedure: ANTERIOR CERVICAL DECOMPRESSION/DISCECTOMY FUSION CERVICAL THREE- CERVICAL FOUR;  Surgeon: Ashok Pall, MD;  Location: [facility];  Service: Neurosurgery;  Laterality: N/A;  Right side approach  . BOTOX INJECTION  08/29/2015   Procedure: BOTOX INJECTION;  Surgeon: Wilford Corner, MD;  Location: [facility];  Service: Endoscopy;;  . CHOLECYSTECTOMY    . COLONOSCOPY  12/03/2011   Procedure: COLONOSCOPY;  Surgeon: [name], MD;  Location: [facility];  Service: Endoscopy;  Laterality: N/A;  . ESOPHAGEAL MANOMETRY N/A  07/13/2017   Procedure: ESOPHAGEAL MANOMETRY (EM);  Surgeon: Wilford Corner, MD;  Location: [facility];  Service: Endoscopy;  Laterality: N/A;  . ESOPHAGOGASTRODUODENOSCOPY  12/03/2011   Procedure: ESOPHAGOGASTRODUODENOSCOPY (EGD);  Surgeon: [name], MD;  Location: [facility];  Service: Endoscopy;  Laterality: N/A;  . ESOPHAGOGASTRODUODENOSCOPY (EGD) WITH PROPOFOL N/A 08/29/2015   Procedure: ESOPHAGOGASTRODUODENOSCOPY (EGD) WITH PROPOFOL;  Surgeon: Wilford Corner, MD;  Location: [facility];  Service: Endoscopy;  Laterality: N/A;  . ESOPHAGOGASTRODUODENOSCOPY (EGD) WITH PROPOFOL N/A 05/17/2019   Procedure: ESOPHAGOGASTRODUODENOSCOPY (EGD) WITH PROPOFOL;  Surgeon: Wilford Corner, MD;  Location: [facility];  Service: Endoscopy;  Laterality: N/A;  . GIVENS CAPSULE STUDY N/A 08/05/2018   Procedure: GIVENS CAPSULE STUDY;  Surgeon: Wilford Corner, MD;  Location: [facility];  Service: Endoscopy;  Laterality: N/A;  . HOT HEMOSTASIS  12/03/2011   Procedure: HOT HEMOSTASIS (ARGON PLASMA COAGULATION/BICAP);  Surgeon: [name], MD;  Location: [facility];  Service: Endoscopy;  Laterality: N/A;  . NECK SURGERY    . ROTATOR CUFF REPAIR     right  . ROTATOR CUFF REPAIR Right 2014  . TONSILLECTOMY      SOCIAL HISTORY: Social History   Socioeconomic History  . Marital status: Divorced    Spouse name: Not on file  . Number of children: 2  . Years of education: Not on file  . Highest education level: Not on file  Occupational History  . Occupation: group Designer, multimedia: [employer]    Comment: [employer]  Tobacco Use  . Smoking status: Former Smoker    Types: Cigarettes    Quit date: 06/02/2000    Years since quitting: 19.7  . Smokeless tobacco: Never Used  Vaping Use  . Vaping Use: Never used  Substance and Sexual Activity  . Alcohol use: No  . Drug use: No  . Sexual activity: Not on file  Other Topics Concern  .  Not on file  Social History Narrative   ** Merged History Encounter **       Social Determinants of Health   Financial Resource Strain:   . Difficulty of Paying Living Expenses: Not on file  Food Insecurity:   . Worried About Charity fundraiser in the Last Year: Not on file  . Ran Out of Food in the Last Year: Not on file  Transportation Needs:   . Lack of Transportation (Medical): Not on file  . Lack of Transportation (Non-Medical): Not on file  Physical Activity:   . Days of Exercise per Week: Not on file  . Minutes of Exercise per Session: Not on file  Stress:   . Feeling of Stress : Not on file  Social Connections:   . Frequency of Communication with Friends and Family: Not on file  . Frequency of Social Gatherings with Friends and Family: Not on  file  . Attends Religious Services: Not on file  . Active Member of Clubs or Organizations: Not on file  . Attends Archivist Meetings: Not on file  . Marital Status: Not on file  Intimate Partner Violence:   . Fear of Current or Ex-Partner: Not on file  . Emotionally Abused: Not on file  . Physically Abused: Not on file  . Sexually Abused: Not on file    FAMILY HISTORY: Family History  Problem Relation Age of Onset  . Diabetes Mother   . Heart attack Brother   . Cancer Maternal Aunt     ALLERGIES:  is allergic to oxycodone and oxycodone.  MEDICATIONS:  Current Outpatient Medications  Medication Sig Dispense Refill  . albuterol (VENTOLIN HFA) 108 (90 Base) MCG/ACT inhaler Inhale 2 puffs into the lungs every 6 (six) hours as needed for wheezing or shortness of breath.     Marland Kitchen aspirin EC 81 MG tablet Take 81 mg by mouth daily.    Marland Kitchen atorvastatin (LIPITOR) 40 MG tablet Take 40 mg by mouth daily.    . Biotin 5000 MCG TABS Take 2 tablets by mouth daily.     . budesonide-formoterol (SYMBICORT) 160-4.5 MCG/ACT inhaler Inhale 2 puffs into the lungs 2 (two) times daily as needed (for shortness of breath).    .  carisoprodol (SOMA) 250 MG tablet Take 250 mg by mouth 2 (two) times daily as needed (cramps).     . Cholecalciferol (D3) 50 MCG (2000 UT) TABS Take 1 tablet by mouth daily.    . [employer] IRON 325 (65 Fe) MG tablet TAKE 1 TABLET EVERY DAY . PLEASE TAKE 1 PILL DAILY WITH ORANGE JUICE OR 500 UNITS OF VITAMIN C 90 tablet 1  . Cyanocobalamin (VITAMIN B 12) 500 MCG TABS Take 1,000 mg by mouth every morning.    . docusate sodium (COLACE) 100 MG capsule Take 100 mg by mouth daily.    Marland Kitchen donepezil (ARICEPT) 5 MG tablet Take 5 mg by mouth at bedtime.    . furosemide (LASIX) 20 MG tablet Take 20 mg by mouth daily.     Marland Kitchen levothyroxine (SYNTHROID) 137 MCG tablet Take 137 mcg by mouth daily before breakfast.    . Magnesium Oxide 420 (252 Mg) MG TABS Take 420 mg by mouth daily.    . montelukast (SINGULAIR) 10 MG tablet Take 10 mg by mouth daily.      . Multiple Vitamins-Minerals (CENTRUM SILVER PO) Take 2 tablets by mouth daily. Gummies    . nitroGLYCERIN (NITROSTAT) 0.4 MG SL tablet Place 0.4 mg under the tongue every 5 (five) minutes as needed for chest pain.    Marland Kitchen omeprazole (PRILOSEC) 40 MG capsule Take 40 mg by mouth daily.    . vitamin E 180 MG (400 UNITS) capsule Take 400 Units by mouth daily.     No current facility-administered medications for this visit.   Facility-Administered Medications Ordered in Other Visits  Medication Dose Route Frequency Provider Last Rate Last Admin  . 0.9 %  sodium chloride infusion   Intravenous Continuous Wilford Corner, MD        REVIEW OF SYSTEMS:   Constitutional: ( - ) fevers, ( - )  chills , ( - ) night sweats (+) fatigue Eyes: ( - ) blurriness of vision, ( - ) double vision, ( - ) watery eyes Ears, nose, mouth, throat, and face: ( - ) mucositis, ( - ) sore throat Respiratory: ( - ) cough, ( - )  dyspnea, ( - ) wheezes Cardiovascular: ( - ) palpitation, ( - ) chest discomfort, ( - ) lower extremity swelling Gastrointestinal:  ( - ) nausea, ( - ) heartburn, ( - )  change in bowel habits Skin: ( - ) abnormal skin rashes Lymphatics: ( - ) new lymphadenopathy, ( - ) easy bruising Neurological: ( - ) numbness, ( - ) tingling, ( - ) new weaknesses Behavioral/Psych: ( - ) mood change, ( - ) new changes  All other systems were reviewed with the patient and are negative.  PHYSICAL EXAMINATION: ECOG PERFORMANCE STATUS: 1 - Symptomatic but completely ambulatory  Vitals:   02/15/20 0827  BP: (!) 141/77  Pulse: 91  Resp: 18  Temp: 97.6 F (36.4 C)  SpO2: 100%   Filed Weights   02/15/20 0827  Weight: 192 lb 4.8 oz (87.2 kg)    GENERAL: well appearing elderly African American female in NAD  SKIN: skin color, texture, turgor are normal, no rashes or significant lesions EYES: conjunctiva are pink and non-injected, sclera clear LUNGS: clear to auscultation and percussion with normal breathing effort HEART: regular rate & rhythm and no murmurs and no lower extremity edema Musculoskeletal: no cyanosis of digits and no clubbing  PSYCH: alert & oriented x 3, fluent speech NEURO: no focal motor/sensory deficits  LABORATORY DATA:  I have reviewed the data as listed CBC Latest Ref Rng & Units 02/15/2020 10/24/2019 07/27/2019  WBC 4.0 - 10.5 K/uL 5.1 5.4 5.9  Hemoglobin 12.0 - 15.0 g/dL 9.7(L) 10.2(L) 9.8(L)  Hematocrit 36 - 46 % 31.2(L) 32.2(L) 31.5(L)  Platelets 150 - 400 K/uL 280 252 271    CMP Latest Ref Rng & Units 02/15/2020 10/24/2019 07/27/2019  Glucose 70 - 99 mg/dL 109(H) 110(H) 111(H)  BUN 8 - 23 mg/dL 15 11 19   Creatinine 0.44 - 1.00 mg/dL 0.98 0.89 0.94  Sodium 135 - 145 mmol/L 144 144 145  Potassium 3.5 - 5.1 mmol/L 3.9 3.3(L) 3.8  Chloride 98 - 111 mmol/L 109 105 106  CO2 22 - 32 mmol/L 25 29 29   Calcium 8.9 - 10.3 mg/dL 9.1 9.3 9.2  Total Protein 6.5 - 8.1 g/dL 7.0 7.2 7.3  Total Bilirubin 0.3 - 1.2 mg/dL 0.5 1.0 0.9  Alkaline Phos 38 - 126 U/L 136(H) 115 141(H)  AST 15 - 41 U/L 17 15 19   ALT 0 - 44 U/L 11 11 15     RADIOGRAPHIC  STUDIES: None relevant to review.  DG Chest 2 View  Result Date: 02/16/2020 CLINICAL DATA:  COPD, asthma EXAM: CHEST - 2 VIEW COMPARISON:  08/21/2014 FINDINGS: Lungs are well expanded, symmetric, and clear. No pneumothorax or pleural effusion. Cardiac size within normal limits. Pulmonary vascularity is normal. Osseous structures are age-appropriate. No acute bone abnormality. IMPRESSION: No active cardiopulmonary disease. Electronically Signed   By: Fidela Salisbury MD   On: 02/16/2020 00:00   DG Shoulder Left  Result Date: 02/15/2020 CLINICAL DATA:  Left shoulder pain EXAM: LEFT SHOULDER - 2+ VIEW COMPARISON:  None. FINDINGS: There is no evidence of fracture or dislocation. There is no evidence of arthropathy or other focal bone abnormality. Soft tissues are unremarkable. IMPRESSION: Negative. Electronically Signed   By: Fidela Salisbury MD   On: 02/15/2020 23:58    [place] 69 y.o. female with medical history significant for iron deficiency anemia 2/2 to chronic blood loss presents for a follow up visit.  Ms. Monroy establish care with [place] in November 2020 and  has been taking p.o. iron since that time.  When her levels failed to improve at her last visit she was started on IV Iron therapy and received IV feraheme 510mg  x 2 doses on 3/8 and 08/15/19.   On exam today Ms. Prouse notes that she continues to feel fatigued and has not had any boost in her energy levels as a result of the IV iron supplementation.  She is also been continue on p.o. iron without much effect.  She remains at approximately baseline compared to her last visit.  We will plan to have Ms. Pacella back in approximately 3 months time in order to reassess or sooner if she were to develop new symptoms or other concerning findings were noted on her pending labs.  #Iron Deficiency Anemia from Chronic Blood Loss --continue PO ferrous sulfide 325mg  daily with source of vitamin C. --source of blood loss is clearly  identified as AVMs in the GI tract. No need for repeat endoscopy at this time.  --transfusion goal for Hgb <7.0.  --patient received IV feraheme 510mg  x2 dose on 3/8-3/15/21. Iron levels from today pending.  --if levels remain low will schedule the patient for a repeat round of IV feraheme.  --RTC in 3 months to reassess  #Sickle Cell Trait -- Hgb electrophoresis confirmed sickle cell trait on 07/27/2019.   #Bleeding AVM/GI Bleeding --currently followed by [facility] --last EGD on 05/17/2019, showed esophagitis and small non-bleeding duodenal angiodysplasias.  --continue to monitor.   No orders of the defined types were placed in this encounter.  All questions were answered. The patient knows to call the clinic with any problems, questions or concerns.  A total of more than 30 minutes were spent on this encounter and over half of that time was spent on counseling and coordination of care as outlined above.   Ledell Peoples, MD Department of Hematology/Oncology [place] at [facility] Phone: [phone] Pager: [phone] Email: [email or web address]  02/19/2020 1:25 PM

## 2020-02-15 ENCOUNTER — Inpatient Hospital Stay: Payer: Medicare HMO

## 2020-02-15 ENCOUNTER — Inpatient Hospital Stay (HOSPITAL_BASED_OUTPATIENT_CLINIC_OR_DEPARTMENT_OTHER): Payer: Medicare HMO | Admitting: Hematology and Oncology

## 2020-02-15 ENCOUNTER — Other Ambulatory Visit: Payer: Self-pay

## 2020-02-15 ENCOUNTER — Ambulatory Visit
Admission: RE | Admit: 2020-02-15 | Discharge: 2020-02-15 | Disposition: A | Discharge status: Home / Self Care | Payer: Medicare HMO | Source: Ambulatory Visit | Attending: Internal Medicine | Admitting: Internal Medicine

## 2020-02-15 ENCOUNTER — Other Ambulatory Visit: Payer: Self-pay | Admitting: Internal Medicine

## 2020-02-15 VITALS — BP 141/77 | HR 91 | Temp 97.6°F | Resp 18 | Ht 67.0 in | Wt 192.3 lb

## 2020-02-15 DIAGNOSIS — J45909 Unspecified asthma, uncomplicated: Secondary | ICD-10-CM

## 2020-02-15 DIAGNOSIS — E119 Type 2 diabetes mellitus without complications: Secondary | ICD-10-CM | POA: Diagnosis not present

## 2020-02-15 DIAGNOSIS — R52 Pain, unspecified: Secondary | ICD-10-CM

## 2020-02-15 DIAGNOSIS — Z8673 Personal history of transient ischemic attack (TIA), and cerebral infarction without residual deficits: Secondary | ICD-10-CM | POA: Diagnosis not present

## 2020-02-15 DIAGNOSIS — Z87891 Personal history of nicotine dependence: Secondary | ICD-10-CM | POA: Diagnosis not present

## 2020-02-15 DIAGNOSIS — E785 Hyperlipidemia, unspecified: Secondary | ICD-10-CM | POA: Diagnosis not present

## 2020-02-15 DIAGNOSIS — E039 Hypothyroidism, unspecified: Secondary | ICD-10-CM | POA: Diagnosis not present

## 2020-02-15 DIAGNOSIS — Z7982 Long term (current) use of aspirin: Secondary | ICD-10-CM | POA: Diagnosis not present

## 2020-02-15 DIAGNOSIS — D500 Iron deficiency anemia secondary to blood loss (chronic): Secondary | ICD-10-CM

## 2020-02-15 DIAGNOSIS — D573 Sickle-cell trait: Secondary | ICD-10-CM

## 2020-02-15 DIAGNOSIS — I10 Essential (primary) hypertension: Secondary | ICD-10-CM | POA: Diagnosis not present

## 2020-02-15 DIAGNOSIS — Z79899 Other long term (current) drug therapy: Secondary | ICD-10-CM | POA: Diagnosis not present

## 2020-02-15 DIAGNOSIS — J449 Chronic obstructive pulmonary disease, unspecified: Secondary | ICD-10-CM

## 2020-02-15 DIAGNOSIS — Q2733 Arteriovenous malformation of digestive system vessel: Secondary | ICD-10-CM | POA: Diagnosis not present

## 2020-02-15 DIAGNOSIS — Z90710 Acquired absence of both cervix and uterus: Secondary | ICD-10-CM | POA: Diagnosis not present

## 2020-02-15 LAB — IRON AND TIBC
Iron: 40 ug/dL — ABNORMAL LOW (ref 41–142)
Saturation Ratios: 14 % — ABNORMAL LOW (ref 21–57)
TIBC: 293 ug/dL (ref 236–444)
UIBC: 253 ug/dL (ref 120–384)

## 2020-02-15 LAB — CMP (CANCER CENTER ONLY)
ALT: 11 U/L (ref 0–44)
AST: 17 U/L (ref 15–41)
Albumin: 3.2 g/dL — ABNORMAL LOW (ref 3.5–5.0)
Alkaline Phosphatase: 136 U/L — ABNORMAL HIGH (ref 38–126)
Anion gap: 10 (ref 5–15)
BUN: 15 mg/dL (ref 8–23)
CO2: 25 mmol/L (ref 22–32)
Calcium: 9.1 mg/dL (ref 8.9–10.3)
Chloride: 109 mmol/L (ref 98–111)
Creatinine: 0.98 mg/dL (ref 0.44–1.00)
GFR, Est AFR Am: >60 mL/min (ref >60)
GFR, Estimated: 59 mL/min — ABNORMAL LOW (ref >60)
Glucose, Bld: 109 mg/dL — ABNORMAL HIGH (ref 70–99)
Potassium: 3.9 mmol/L (ref 3.5–5.1)
Sodium: 144 mmol/L (ref 135–145)
Total Bilirubin: 0.5 mg/dL (ref 0.3–1.2)
Total Protein: 7 g/dL (ref 6.5–8.1)

## 2020-02-15 LAB — RETIC PANEL
Immature Retic Fract: 18.7 % — ABNORMAL HIGH (ref 2.3–15.9)
RBC.: 3.92 MIL/uL (ref 3.87–5.11)
Retic Count, Absolute: 47.8 10*3/uL (ref 19.0–186.0)
Retic Ct Pct: 1.2 % (ref 0.4–3.1)
Reticulocyte Hemoglobin: 29 pg (ref >27.9)

## 2020-02-15 LAB — CBC WITH DIFFERENTIAL (CANCER CENTER ONLY)
Abs Immature Granulocytes: 0.01 10*3/uL (ref 0.00–0.07)
Basophils Absolute: 0 10*3/uL (ref 0.0–0.1)
Basophils Relative: 1 %
Eosinophils Absolute: 0.2 10*3/uL (ref 0.0–0.5)
Eosinophils Relative: 4 %
HCT: 31.2 % — ABNORMAL LOW (ref 36.0–46.0)
Hemoglobin: 9.7 g/dL — ABNORMAL LOW (ref 12.0–15.0)
Immature Granulocytes: 0 %
Lymphocytes Relative: 30 %
Lymphs Abs: 1.5 10*3/uL (ref 0.7–4.0)
MCH: 24.9 pg — ABNORMAL LOW (ref 26.0–34.0)
MCHC: 31.1 g/dL (ref 30.0–36.0)
MCV: 80.2 fL (ref 80.0–100.0)
Monocytes Absolute: 0.7 10*3/uL (ref 0.1–1.0)
Monocytes Relative: 13 %
Neutro Abs: 2.7 10*3/uL (ref 1.7–7.7)
Neutrophils Relative %: 52 %
Platelet Count: 280 10*3/uL (ref 150–400)
RBC: 3.89 MIL/uL (ref 3.87–5.11)
RDW: 14 % (ref 11.5–15.5)
WBC Count: 5.1 10*3/uL (ref 4.0–10.5)
nRBC: 0 % (ref 0.0–0.2)

## 2020-02-15 LAB — FERRITIN: Ferritin: 91 ng/mL (ref 11–307)

## 2020-02-20 ENCOUNTER — Telehealth: Payer: Self-pay | Admitting: Hematology and Oncology

## 2020-02-20 NOTE — Telephone Encounter (Signed)
Scheduled appointments per 9/19 scheduling message. Called patient, no answer. Left a message for patient with appointments dates and times.

## 2020-02-24 ENCOUNTER — Other Ambulatory Visit: Payer: Self-pay

## 2020-02-24 ENCOUNTER — Inpatient Hospital Stay: Payer: Medicare HMO

## 2020-02-24 VITALS — BP 126/71 | HR 76 | Temp 98.1°F | Resp 16

## 2020-02-24 DIAGNOSIS — D500 Iron deficiency anemia secondary to blood loss (chronic): Secondary | ICD-10-CM | POA: Diagnosis not present

## 2020-02-24 MED ORDER — SODIUM CHLORIDE 0.9 % IV SOLN
510.0000 mg | Freq: Once | INTRAVENOUS | Status: AC
  Administered 2020-02-24: 510 mg via INTRAVENOUS
  Filled 2020-02-24: qty 510

## 2020-02-24 MED ORDER — SODIUM CHLORIDE 0.9 % IV SOLN
Freq: Once | INTRAVENOUS | Status: AC
Start: 2020-02-24 — End: 2020-02-24
  Filled 2020-02-24: qty 250

## 2020-02-24 NOTE — Patient Instructions (Signed)

## 2020-03-02 ENCOUNTER — Other Ambulatory Visit: Payer: Self-pay

## 2020-03-02 ENCOUNTER — Other Ambulatory Visit: Payer: Self-pay | Admitting: *Deleted

## 2020-03-02 ENCOUNTER — Inpatient Hospital Stay: Payer: Medicare HMO | Attending: Hematology and Oncology

## 2020-03-02 VITALS — BP 116/64 | HR 73 | Temp 98.1°F | Resp 17

## 2020-03-02 DIAGNOSIS — Z79899 Other long term (current) drug therapy: Secondary | ICD-10-CM | POA: Diagnosis not present

## 2020-03-02 DIAGNOSIS — Z23 Encounter for immunization: Secondary | ICD-10-CM | POA: Diagnosis not present

## 2020-03-02 DIAGNOSIS — D500 Iron deficiency anemia secondary to blood loss (chronic): Secondary | ICD-10-CM

## 2020-03-02 MED ORDER — SODIUM CHLORIDE 0.9 % IV SOLN
510.0000 mg | Freq: Once | INTRAVENOUS | Status: AC
  Administered 2020-03-02: 510 mg via INTRAVENOUS
  Filled 2020-03-02: qty 510

## 2020-03-02 MED ORDER — INFLUENZA VAC A&B SA ADJ QUAD 0.5 ML IM PRSY
PREFILLED_SYRINGE | INTRAMUSCULAR | Status: AC
  Filled 2020-03-02: qty 0.5

## 2020-03-02 MED ORDER — SODIUM CHLORIDE 0.9 % IV SOLN
Freq: Once | INTRAVENOUS | Status: AC
  Filled 2020-03-02: qty 250

## 2020-03-02 MED ORDER — FERROUS SULFATE 325 (65 FE) MG PO TABS: ORAL_TABLET | ORAL | 1 refills | Status: DC

## 2020-03-02 MED ORDER — INFLUENZA VAC A&B SA ADJ QUAD 0.5 ML IM PRSY
0.5000 mL | PREFILLED_SYRINGE | Freq: Once | INTRAMUSCULAR | Status: AC
  Administered 2020-03-02: 0.5 mL via INTRAMUSCULAR

## 2020-03-02 NOTE — Patient Instructions (Addendum)
Ferumoxytol injection What is this medicine? FERUMOXYTOL is an iron complex. Iron is used to make healthy red blood cells, which carry oxygen and nutrients throughout the body. This medicine is used to treat iron deficiency anemia. This medicine may be used for other purposes; ask your health care provider or pharmacist if you have questions. COMMON BRAND NAME(S): Feraheme What should I tell my health care provider before I take this medicine? They need to know if you have any of these conditions:  anemia not caused by low iron levels  high levels of iron in the blood  magnetic resonance imaging (MRI) test scheduled  an unusual or allergic reaction to iron, other medicines, foods, dyes, or preservatives  pregnant or trying to get pregnant  breast-feeding How should I use this medicine? This medicine is for injection into a vein. It is given by a health care professional in a hospital or clinic setting. Talk to your pediatrician regarding the use of this medicine in children. Special care may be needed. Overdosage: If you think you have taken too much of this medicine contact a poison control center or emergency room at once. NOTE: This medicine is only for you. Do not share this medicine with others. What if I miss a dose? It is important not to miss your dose. Call your doctor or health care professional if you are unable to keep an appointment. What may interact with this medicine? This medicine may interact with the following medications:  other iron products This list may not describe all possible interactions. Give your health care provider a list of all the medicines, herbs, non-prescription drugs, or dietary supplements you use. Also tell them if you smoke, drink alcohol, or use illegal drugs. Some items may interact with your medicine. What should I watch for while using this medicine? Visit your doctor or healthcare professional regularly. Tell your doctor or healthcare  professional if your symptoms do not start to get better or if they get worse. You may need blood work done while you are taking this medicine. You may need to follow a special diet. Talk to your doctor. Foods that contain iron include: whole grains/cereals, dried fruits, beans, or peas, leafy green vegetables, and organ meats (liver, kidney). What side effects may I notice from receiving this medicine? Side effects that you should report to your doctor or health care professional as soon as possible:  allergic reactions like skin rash, itching or hives, swelling of the face, lips, or tongue  breathing problems  changes in blood pressure  feeling faint or lightheaded, falls  fever or chills  flushing, sweating, or hot feelings  swelling of the ankles or feet Side effects that usually do not require medical attention (report to your doctor or health care professional if they continue or are bothersome):  diarrhea  headache  nausea, vomiting  stomach pain This list may not describe all possible side effects. Call your doctor for medical advice about side effects. You may report side effects to FDA at 1-800-FDA-1088. Where should I keep my medicine? This drug is given in a hospital or clinic and will not be stored at home. NOTE: This sheet is a summary. It may not cover all possible information. If you have questions about this medicine, talk to your doctor, pharmacist, or health care provider.  2020 Elsevier/Gold Standard (2016-07-07 20:21:10)  Influenza Virus Vaccine injection What is this medicine? INFLUENZA VIRUS VACCINE (in floo EN zuh VAHY ruhs vak SEEN) helps to reduce the   risk of getting influenza also known as the flu. The vaccine only helps protect you against some strains of the flu. This medicine may be used for other purposes; ask your health care provider or pharmacist if you have questions. COMMON BRAND NAME(S): Afluria, Afluria Quadrivalent, Agriflu, Alfuria, FLUAD,  Fluarix, Fluarix Quadrivalent, Flublok, Flublok Quadrivalent, FLUCELVAX, FLUCELVAX Quadrivalent, Flulaval, Flulaval Quadrivalent, Fluvirin, Fluzone, Fluzone High-Dose, Fluzone Intradermal, Fluzone Quadrivalent What should I tell my health care provider before I take this medicine? They need to know if you have any of these conditions:  bleeding disorder like hemophilia  fever or infection  Guillain-Barre syndrome or other neurological problems  immune system problems  infection with the human immunodeficiency virus (HIV) or AIDS  low blood platelet counts  multiple sclerosis  an unusual or allergic reaction to influenza virus vaccine, latex, other medicines, foods, dyes, or preservatives. Different brands of vaccines contain different allergens. Some may contain latex or eggs. Talk to your doctor about your allergies to make sure that you get the right vaccine.  pregnant or trying to get pregnant  breast-feeding How should I use this medicine? This vaccine is for injection into a muscle or under the skin. It is given by a health care professional. A copy of Vaccine Information Statements will be given before each vaccination. Read this sheet carefully each time. The sheet may change frequently. Talk to your healthcare provider to see which vaccines are right for you. Some vaccines should not be used in all age groups. Overdosage: If you think you have taken too much of this medicine contact a poison control center or emergency room at once. NOTE: This medicine is only for you. Do not share this medicine with others. What if I miss a dose? This does not apply. What may interact with this medicine?  chemotherapy or radiation therapy  medicines that lower your immune system like etanercept, anakinra, infliximab, and adalimumab  medicines that treat or prevent blood clots like warfarin  phenytoin  steroid medicines like prednisone or cortisone  theophylline  vaccines This  list may not describe all possible interactions. Give your health care provider a list of all the medicines, herbs, non-prescription drugs, or dietary supplements you use. Also tell them if you smoke, drink alcohol, or use illegal drugs. Some items may interact with your medicine. What should I watch for while using this medicine? Report any side effects that do not go away within 3 days to your doctor or health care professional. Call your health care provider if any unusual symptoms occur within 6 weeks of receiving this vaccine. You may still catch the flu, but the illness is not usually as bad. You cannot get the flu from the vaccine. The vaccine will not protect against colds or other illnesses that may cause fever. The vaccine is needed every year. What side effects may I notice from receiving this medicine? Side effects that you should report to your doctor or health care professional as soon as possible:  allergic reactions like skin rash, itching or hives, swelling of the face, lips, or tongue Side effects that usually do not require medical attention (report to your doctor or health care professional if they continue or are bothersome):  fever  headache  muscle aches and pains  pain, tenderness, redness, or swelling at the injection site  tiredness This list may not describe all possible side effects. Call your doctor for medical advice about side effects. You may report side effects to FDA at 1-800-FDA-1088.   Where should I keep my medicine? The vaccine will be given by a health care professional in a clinic, pharmacy, doctor's office, or other health care setting. You will not be given vaccine doses to store at home. NOTE: This sheet is a summary. It may not cover all possible information. If you have questions about this medicine, talk to your doctor, pharmacist, or health care provider.  2020 Elsevier/Gold Standard (2018-04-13 08:45:43)   

## 2020-03-12 ENCOUNTER — Other Ambulatory Visit: Payer: Self-pay | Admitting: Internal Medicine

## 2020-03-12 ENCOUNTER — Other Ambulatory Visit: Payer: Self-pay | Admitting: Hematology and Oncology

## 2020-03-12 DIAGNOSIS — Z1231 Encounter for screening mammogram for malignant neoplasm of breast: Secondary | ICD-10-CM

## 2020-03-14 ENCOUNTER — Ambulatory Visit
Admission: RE | Admit: 2020-03-14 | Discharge: 2020-03-14 | Disposition: A | Discharge status: Home / Self Care | Payer: Medicare HMO | Source: Ambulatory Visit | Attending: Internal Medicine | Admitting: Internal Medicine

## 2020-03-14 ENCOUNTER — Other Ambulatory Visit: Payer: Self-pay

## 2020-03-14 DIAGNOSIS — Z1231 Encounter for screening mammogram for malignant neoplasm of breast: Secondary | ICD-10-CM

## 2020-03-19 ENCOUNTER — Other Ambulatory Visit: Payer: Self-pay | Admitting: Internal Medicine

## 2020-03-19 DIAGNOSIS — R928 Other abnormal and inconclusive findings on diagnostic imaging of breast: Secondary | ICD-10-CM

## 2020-04-04 ENCOUNTER — Other Ambulatory Visit: Payer: Self-pay

## 2020-04-04 ENCOUNTER — Ambulatory Visit
Admission: RE | Admit: 2020-04-04 | Discharge: 2020-04-04 | Disposition: A | Discharge status: Home / Self Care | Payer: Medicare HMO | Source: Ambulatory Visit | Attending: Internal Medicine | Admitting: Internal Medicine

## 2020-04-04 DIAGNOSIS — R928 Other abnormal and inconclusive findings on diagnostic imaging of breast: Secondary | ICD-10-CM

## 2020-04-19 ENCOUNTER — Other Ambulatory Visit: Payer: Self-pay | Admitting: Hematology and Oncology

## 2020-04-19 DIAGNOSIS — D500 Iron deficiency anemia secondary to blood loss (chronic): Secondary | ICD-10-CM

## 2020-04-19 NOTE — Progress Notes (Signed)
[place] [phone]   [phone]  PROGRESS NOTE  Patient Care Team: Rogers Blocker, MD as PCP - General (Internal Medicine)  Hematological/Oncological History # Iron Deficiency Anemia due to Chronic Blood Loss 1) 03/26/2009: Hgb 7.2, first Hgb on record 2) 09/02/2011: 2 units of PRBC at [facility] (Hgb noted at 8.9).  3)  12/03/2011: colonoscopy, found colon polyp and sigmoid diverticula. 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 Underwent argon plasma coagulation  4) Jan 2018: colonoscopy with 2 tubular adenomas removed. Small colonic AVM and small internal hemorrhoids.  5)10/24/2017: Hgb 7.7, MCV 71, Plt 338, and WBC 6.7. POEM procedure for Achalasia. duodenal AVM fulgurated.  6) 03/2018: Hgb 8.2.  7) March 2020: capsule endoscopy: nonbleeding gastric and proximal small bowel AVM,  8) 04/26/2019: Establish Care with Dr. Lorenso Courier  WBC 6.1, Hgb 9.3, MCV 75.3, Plt 289 9) 05/17/2019: Underwent EGD which showed esophagitis and small non-bleeding duodenal angiodysplasias. 10) 07/27/2019: WBC 5.9, Hgb 9.8, MCV 77.8, Plt 271. Recommend IV iron based on labs. Received feraheme 510mg  IV q7 days x 2 doses on 3/8 and 08/15/2019.   Interval History:  Lacey Vega 69 y.o. female with medical history significant for iron deficiency anemia 2/2 to chronic blood loss presents for a follow up visit. The patient's last visit was on 02/15/2020. In the interim since the last visit Lacey Vega had no further interventions, surgeries, or hospitalizations.   On exam today Lacey Vega notes she continues to feel tired.  She reports that she gets up tired and goes to bed tired.  She reports that she did feel little better after her initial round of IV iron, however with subsequent doses she "did not feel a thing".  She knows she has been taking her iron pills as prescribed with no real boost or benefit to her energy levels.  She notes that the iron pills have not caused  her any GI upset or constipation.  She notes that her sleeping patterns have been off, but otherwise her weight has been stable and she has had no issues with fevers, chills, sweats, nausea, vomiting or diarrhea.  A full 10 point ROS is listed below.  MEDICAL HISTORY:  Past Medical History:  Diagnosis Date  . Anemia   . Anxiety   . Asthma   . Asthma   . Bronchitis    hx  . Colon polyps    hyperplastic  . Diabetes ([place])    mild  . Diabetes mellitus   . DJD (degenerative joint disease), cervical   . Dysphagia    history of  . Dyspnea   . Gallstones   . GERD (gastroesophageal reflux disease)   . H/O chest pain    secondary to anemia  . Headache   . History of arteriovenous malformation (AVM)   . History of hiatal hernia   . History of tobacco use   . Hyperlipemia   . Hypertension   . Hypothyroidism   . Iron deficiency anemia   . Sickle cell anemia (HCC)    "patient is not aware of this"  . Thyroid disease   . TIA (transient ischemic attack)    "was told that she possible had one"    SURGICAL HISTORY: Past Surgical History:  Procedure Laterality Date  . ABDOMINAL HYSTERECTOMY    . ANTERIOR CERVICAL DECOMP/DISCECTOMY FUSION N/A 10/03/2016   Procedure: ANTERIOR CERVICAL DECOMPRESSION/DISCECTOMY FUSION CERVICAL THREE- CERVICAL FOUR;  Surgeon: Ashok Pall, MD;  Location: [facility]  OR;  Service: Neurosurgery;  Laterality: N/A;  Right side approach  . BOTOX INJECTION  08/29/2015   Procedure: BOTOX INJECTION;  Surgeon: Wilford Corner, MD;  Location: [facility];  Service: Endoscopy;;  . CHOLECYSTECTOMY    . COLONOSCOPY  12/03/2011   Procedure: COLONOSCOPY;  Surgeon: [name], MD;  Location: [facility] ENDOSCOPY;  Service: Endoscopy;  Laterality: N/A;  . ESOPHAGEAL MANOMETRY N/A 07/13/2017   Procedure: ESOPHAGEAL MANOMETRY (EM);  Surgeon: Wilford Corner, MD;  Location: [facility] ENDOSCOPY;  Service: Endoscopy;  Laterality: N/A;  . ESOPHAGOGASTRODUODENOSCOPY  12/03/2011   Procedure:  ESOPHAGOGASTRODUODENOSCOPY (EGD);  Surgeon: [name], MD;  Location: [facility];  Service: Endoscopy;  Laterality: N/A;  . ESOPHAGOGASTRODUODENOSCOPY (EGD) WITH PROPOFOL N/A 08/29/2015   Procedure: ESOPHAGOGASTRODUODENOSCOPY (EGD) WITH PROPOFOL;  Surgeon: Wilford Corner, MD;  Location: [facility] ENDOSCOPY;  Service: Endoscopy;  Laterality: N/A;  . ESOPHAGOGASTRODUODENOSCOPY (EGD) WITH PROPOFOL N/A 05/17/2019   Procedure: ESOPHAGOGASTRODUODENOSCOPY (EGD) WITH PROPOFOL;  Surgeon: Wilford Corner, MD;  Location: [facility] ENDOSCOPY;  Service: Endoscopy;  Laterality: N/A;  . GIVENS CAPSULE STUDY N/A 08/05/2018   Procedure: GIVENS CAPSULE STUDY;  Surgeon: Wilford Corner, MD;  Location: [place];  Service: Endoscopy;  Laterality: N/A;  . HOT HEMOSTASIS  12/03/2011   Procedure: HOT HEMOSTASIS (ARGON PLASMA COAGULATION/BICAP);  Surgeon: [name], MD;  Location: [facility];  Service: Endoscopy;  Laterality: N/A;  . NECK SURGERY    . ROTATOR CUFF REPAIR     right  . ROTATOR CUFF REPAIR Right 2014  . TONSILLECTOMY      SOCIAL HISTORY: Social History   Socioeconomic History  . Marital status: Divorced    Spouse name: Not on file  . Number of children: 2  . Years of education: Not on file  . Highest education level: Not on file  Occupational History  . Occupation: group Designer, multimedia: [employer]    Comment: [employer]  Tobacco Use  . Smoking status: Former Smoker    Types: Cigarettes    Quit date: 06/02/2000    Years since quitting: 19.8  . Smokeless tobacco: Never Used  Vaping Use  . Vaping Use: Never used  Substance and Sexual Activity  . Alcohol use: No  . Drug use: No  . Sexual activity: Not on file  Other Topics Concern  . Not on file  Social History Narrative   ** Merged History Encounter **       Social Determinants of Health   Financial Resource Strain:   . Difficulty of Paying Living Expenses: Not on file  Food  Insecurity:   . Worried About Charity fundraiser in the Last Year: Not on file  . Ran Out of Food in the Last Year: Not on file  Transportation Needs:   . Lack of Transportation (Medical): Not on file  . Lack of Transportation (Non-Medical): Not on file  Physical Activity:   . Days of Exercise per Week: Not on file  . Minutes of Exercise per Session: Not on file  Stress:   . Feeling of Stress : Not on file  Social Connections:   . Frequency of Communication with Friends and Family: Not on file  . Frequency of Social Gatherings with Friends and Family: Not on file  . Attends Religious Services: Not on file  . Active Member of Clubs or Organizations: Not on file  . Attends Archivist Meetings: Not on file  . Marital Status: Not on  file  Intimate Partner Violence:   . Fear of Current or Ex-Partner: Not on file  . Emotionally Abused: Not on file  . Physically Abused: Not on file  . Sexually Abused: Not on file    FAMILY HISTORY: Family History  Problem Relation Age of Onset  . Diabetes Mother   . Heart attack Brother   . Cancer Maternal Aunt     ALLERGIES:  is allergic to oxycodone and oxycodone.  MEDICATIONS:  Current Outpatient Medications  Medication Sig Dispense Refill  . albuterol (VENTOLIN HFA) 108 (90 Base) MCG/ACT inhaler Inhale 2 puffs into the lungs every 6 (six) hours as needed for wheezing or shortness of breath.     Marland Kitchen aspirin EC 81 MG tablet Take 81 mg by mouth daily.    Marland Kitchen atorvastatin (LIPITOR) 40 MG tablet Take 40 mg by mouth daily.    . Biotin 5000 MCG TABS Take 2 tablets by mouth daily.     . budesonide-formoterol (SYMBICORT) 160-4.5 MCG/ACT inhaler Inhale 2 puffs into the lungs 2 (two) times daily as needed (for shortness of breath).    . carisoprodol (SOMA) 250 MG tablet Take 250 mg by mouth 2 (two) times daily as needed (cramps).     . Cholecalciferol (D3) 50 MCG (2000 UT) TABS Take 1 tablet by mouth daily.    . Cyanocobalamin (VITAMIN B 12) 500  MCG TABS Take 1,000 mg by mouth every morning.    . docusate sodium (COLACE) 100 MG capsule Take 100 mg by mouth daily.    Marland Kitchen donepezil (ARICEPT) 5 MG tablet Take 5 mg by mouth at bedtime.    . ferrous sulfate ([employer] IRON) 325 (65 FE) MG tablet TAKE 1 TABLET EVERY DAY . PLEASE TAKE 1 PILL DAILY WITH ORANGE JUICE OR 500 UNITS OF VITAMIN C 90 tablet 1  . furosemide (LASIX) 20 MG tablet Take 20 mg by mouth daily.     Marland Kitchen levothyroxine (SYNTHROID) 137 MCG tablet Take 137 mcg by mouth daily before breakfast.    . Magnesium Oxide 420 (252 Mg) MG TABS Take 420 mg by mouth daily.    . montelukast (SINGULAIR) 10 MG tablet Take 10 mg by mouth daily.      . Multiple Vitamins-Minerals (CENTRUM SILVER PO) Take 2 tablets by mouth daily. Gummies    . nitroGLYCERIN (NITROSTAT) 0.4 MG SL tablet Place 0.4 mg under the tongue every 5 (five) minutes as needed for chest pain.    Marland Kitchen omeprazole (PRILOSEC) 40 MG capsule Take 40 mg by mouth daily.    . vitamin E 180 MG (400 UNITS) capsule Take 400 Units by mouth daily.     No current facility-administered medications for this visit.   Facility-Administered Medications Ordered in Other Visits  Medication Dose Route Frequency Provider Last Rate Last Admin  . 0.9 %  sodium chloride infusion   Intravenous Continuous Wilford Corner, MD        REVIEW OF SYSTEMS:   Constitutional: ( - ) fevers, ( - )  chills , ( - ) night sweats (+) fatigue Eyes: ( - ) blurriness of vision, ( - ) double vision, ( - ) watery eyes Ears, nose, mouth, throat, and face: ( - ) mucositis, ( - ) sore throat Respiratory: ( - ) cough, ( - ) dyspnea, ( - ) wheezes Cardiovascular: ( - ) palpitation, ( - ) chest discomfort, ( - ) lower extremity swelling Gastrointestinal:  ( - ) nausea, ( - ) heartburn, ( - )  change in bowel habits Skin: ( - ) abnormal skin rashes Lymphatics: ( - ) new lymphadenopathy, ( - ) easy bruising Neurological: ( - ) numbness, ( - ) tingling, ( - ) new  weaknesses Behavioral/Psych: ( - ) mood change, ( - ) new changes  All other systems were reviewed with the patient and are negative.  PHYSICAL EXAMINATION: ECOG PERFORMANCE STATUS: 1 - Symptomatic but completely ambulatory  There were no vitals filed for this visit. There were no vitals filed for this visit.  GENERAL: well appearing elderly African American female in NAD  SKIN: skin color, texture, turgor are normal, no rashes or significant lesions EYES: conjunctiva are pink and non-injected, sclera clear LUNGS: clear to auscultation and percussion with normal breathing effort HEART: regular rate & rhythm and no murmurs and no lower extremity edema Musculoskeletal: no cyanosis of digits and no clubbing  PSYCH: alert & oriented x 3, fluent speech NEURO: no focal motor/sensory deficits  LABORATORY DATA:  I have reviewed the data as listed CBC Latest Ref Rng & Units 02/15/2020 10/24/2019 07/27/2019  WBC 4.0 - 10.5 K/uL 5.1 5.4 5.9  Hemoglobin 12.0 - 15.0 g/dL 9.7(L) 10.2(L) 9.8(L)  Hematocrit 36 - 46 % 31.2(L) 32.2(L) 31.5(L)  Platelets 150 - 400 K/uL 280 252 271    CMP Latest Ref Rng & Units 02/15/2020 10/24/2019 07/27/2019  Glucose 70 - 99 mg/dL 109(H) 110(H) 111(H)  BUN 8 - 23 mg/dL 15 11 19   Creatinine 0.44 - 1.00 mg/dL 0.98 0.89 0.94  Sodium 135 - 145 mmol/L 144 144 145  Potassium 3.5 - 5.1 mmol/L 3.9 3.3(L) 3.8  Chloride 98 - 111 mmol/L 109 105 106  CO2 22 - 32 mmol/L 25 29 29   Calcium 8.9 - 10.3 mg/dL 9.1 9.3 9.2  Total Protein 6.5 - 8.1 g/dL 7.0 7.2 7.3  Total Bilirubin 0.3 - 1.2 mg/dL 0.5 1.0 0.9  Alkaline Phos 38 - 126 U/L 136(H) 115 141(H)  AST 15 - 41 U/L 17 15 19   ALT 0 - 44 U/L 11 11 15     RADIOGRAPHIC STUDIES: None relevant to review.  US BREAST LTD UNI RIGHT INC AXILLA  Result Date: 04/04/2020 CLINICAL DATA:  Recalled from screening for right breast asymmetry. EXAM: DIGITAL DIAGNOSTIC RIGHT MAMMOGRAM WITH CAD AND TOMO ULTRASOUND RIGHT BREAST COMPARISON:   Previous exam(s). ACR Breast Density Category b: There are scattered areas of fibroglandular density. FINDINGS: Questioned asymmetry within the medial right breast predominately effaced with additional imaging suggestive of dense fibroglandular tissue. Mammographic images were processed with CAD. Targeted ultrasound is performed, showing normal tissue without suspicious mass within the medial right breast. IMPRESSION: No mammographic evidence for malignancy. RECOMMENDATION: Screening mammogram in one year.(Code:SM-B-01Y) I have discussed the findings and recommendations with the patient. If applicable, a reminder letter will be sent to the patient regarding the next appointment. BI-RADS CATEGORY  1: Negative. Electronically Signed   By: Lovey Newcomer M.D.   On: 04/04/2020 10:52   MM DIAG BREAST TOMO UNI RIGHT  Result Date: 04/04/2020 CLINICAL DATA:  Recalled from screening for right breast asymmetry. EXAM: DIGITAL DIAGNOSTIC RIGHT MAMMOGRAM WITH CAD AND TOMO ULTRASOUND RIGHT BREAST COMPARISON:  Previous exam(s). ACR Breast Density Category b: There are scattered areas of fibroglandular density. FINDINGS: Questioned asymmetry within the medial right breast predominately effaced with additional imaging suggestive of dense fibroglandular tissue. Mammographic images were processed with CAD. Targeted ultrasound is performed, showing normal tissue without suspicious mass within the medial right breast. IMPRESSION: No mammographic  evidence for malignancy. RECOMMENDATION: Screening mammogram in one year.(Code:SM-B-01Y) I have discussed the findings and recommendations with the patient. If applicable, a reminder letter will be sent to the patient regarding the next appointment. BI-RADS CATEGORY  1: Negative. Electronically Signed   By: Lovey Newcomer M.D.   On: 04/04/2020 10:52    ASSESSMENT & PLAN Lacey Vega 69 y.o. female with medical history significant for iron deficiency anemia 2/2 to chronic blood loss  presents for a follow up visit.  Lacey Vega establish care with [place] in November 2020 and has been taking p.o. iron since that time.  When her levels failed to improve at her last visit she was started on IV Iron therapy and received IV feraheme 510mg  x 2 doses on 3/8 and 08/15/19.   On exam today Lacey Vega notes continued fatigue.  Her hemoglobin has dropped down to 9.2 despite continued use of p.o. iron.  Other than fatigue she is asymptomatic.  We will plan to have Lacey Vega back in approximately 3 months time in order to reassess or sooner if she were to develop new symptoms or other concerning findings were noted on her pending labs.  #Iron Deficiency Anemia from Chronic Blood Loss --continue PO ferrous sulfide 325mg  daily with source of vitamin C. --source of blood loss is clearly identified as AVMs in the GI tract. No need for repeat endoscopy at this time.  --transfusion goal for Hgb <7.0.  --patient received IV feraheme 510mg  x2 dose on 3/8-3/15/21. Iron levels from today pending.  --if levels remain low will schedule the patient for a repeat round of IV feraheme.  --RTC in 3 months to reassess  #Sickle Cell Trait -- Hgb electrophoresis confirmed sickle cell trait on 07/27/2019.   #Bleeding AVM/GI Bleeding --currently followed by [facility] --last EGD on 05/17/2019, showed esophagitis and small non-bleeding duodenal angiodysplasias.  --continue to monitor.   No orders of the defined types were placed in this encounter.  All questions were answered. The patient knows to call the clinic with any problems, questions or concerns.  A total of more than 30 minutes were spent on this encounter and over half of that time was spent on counseling and coordination of care as outlined above.   Ledell Peoples, MD Department of Hematology/Oncology [place] [facility] Phone: [phone] Pager: [phone] Email: [email or web address]  04/19/2020 4:56 PM

## 2020-04-20 ENCOUNTER — Encounter: Payer: Self-pay | Admitting: Hematology and Oncology

## 2020-04-20 ENCOUNTER — Inpatient Hospital Stay (HOSPITAL_BASED_OUTPATIENT_CLINIC_OR_DEPARTMENT_OTHER): Payer: Medicare HMO | Admitting: Hematology and Oncology

## 2020-04-20 ENCOUNTER — Other Ambulatory Visit: Payer: Self-pay

## 2020-04-20 ENCOUNTER — Inpatient Hospital Stay: Payer: Medicare HMO | Attending: Hematology and Oncology

## 2020-04-20 VITALS — BP 140/71 | HR 94 | Temp 98.1°F | Resp 18 | Ht 67.0 in | Wt 194.6 lb

## 2020-04-20 DIAGNOSIS — I10 Essential (primary) hypertension: Secondary | ICD-10-CM | POA: Diagnosis not present

## 2020-04-20 DIAGNOSIS — Z8673 Personal history of transient ischemic attack (TIA), and cerebral infarction without residual deficits: Secondary | ICD-10-CM | POA: Insufficient documentation

## 2020-04-20 DIAGNOSIS — D573 Sickle-cell trait: Secondary | ICD-10-CM | POA: Insufficient documentation

## 2020-04-20 DIAGNOSIS — Z7951 Long term (current) use of inhaled steroids: Secondary | ICD-10-CM | POA: Insufficient documentation

## 2020-04-20 DIAGNOSIS — D500 Iron deficiency anemia secondary to blood loss (chronic): Secondary | ICD-10-CM

## 2020-04-20 DIAGNOSIS — Z79899 Other long term (current) drug therapy: Secondary | ICD-10-CM | POA: Diagnosis not present

## 2020-04-20 DIAGNOSIS — Z8249 Family history of ischemic heart disease and other diseases of the circulatory system: Secondary | ICD-10-CM | POA: Diagnosis not present

## 2020-04-20 DIAGNOSIS — E785 Hyperlipidemia, unspecified: Secondary | ICD-10-CM | POA: Insufficient documentation

## 2020-04-20 DIAGNOSIS — Z809 Family history of malignant neoplasm, unspecified: Secondary | ICD-10-CM | POA: Diagnosis not present

## 2020-04-20 DIAGNOSIS — E119 Type 2 diabetes mellitus without complications: Secondary | ICD-10-CM | POA: Insufficient documentation

## 2020-04-20 DIAGNOSIS — Z7982 Long term (current) use of aspirin: Secondary | ICD-10-CM | POA: Insufficient documentation

## 2020-04-20 DIAGNOSIS — Z87891 Personal history of nicotine dependence: Secondary | ICD-10-CM | POA: Insufficient documentation

## 2020-04-20 DIAGNOSIS — J45909 Unspecified asthma, uncomplicated: Secondary | ICD-10-CM | POA: Diagnosis not present

## 2020-04-20 DIAGNOSIS — Z833 Family history of diabetes mellitus: Secondary | ICD-10-CM | POA: Insufficient documentation

## 2020-04-20 DIAGNOSIS — K31819 Angiodysplasia of stomach and duodenum without bleeding: Secondary | ICD-10-CM | POA: Diagnosis not present

## 2020-04-20 DIAGNOSIS — E039 Hypothyroidism, unspecified: Secondary | ICD-10-CM | POA: Diagnosis not present

## 2020-04-20 LAB — CMP (CANCER CENTER ONLY)
ALT: 9 U/L (ref 0–44)
AST: 13 U/L — ABNORMAL LOW (ref 15–41)
Albumin: 3.5 g/dL (ref 3.5–5.0)
Alkaline Phosphatase: 124 U/L (ref 38–126)
Anion gap: 7 (ref 5–15)
BUN: 8 mg/dL (ref 8–23)
CO2: 28 mmol/L (ref 22–32)
Calcium: 9.3 mg/dL (ref 8.9–10.3)
Chloride: 109 mmol/L (ref 98–111)
Creatinine: 0.86 mg/dL (ref 0.44–1.00)
GFR, Estimated: >60 mL/min (ref >60)
Glucose, Bld: 108 mg/dL — ABNORMAL HIGH (ref 70–99)
Potassium: 3.3 mmol/L — ABNORMAL LOW (ref 3.5–5.1)
Sodium: 144 mmol/L (ref 135–145)
Total Bilirubin: 1 mg/dL (ref 0.3–1.2)
Total Protein: 7 g/dL (ref 6.5–8.1)

## 2020-04-20 LAB — FERRITIN: Ferritin: 447 ng/mL — ABNORMAL HIGH (ref 11–307)

## 2020-04-20 LAB — CBC WITH DIFFERENTIAL (CANCER CENTER ONLY)
Abs Immature Granulocytes: 0.01 10*3/uL (ref 0.00–0.07)
Basophils Absolute: 0 10*3/uL (ref 0.0–0.1)
Basophils Relative: 1 %
Eosinophils Absolute: 0.2 10*3/uL (ref 0.0–0.5)
Eosinophils Relative: 3 %
HCT: 29.2 % — ABNORMAL LOW (ref 36.0–46.0)
Hemoglobin: 9.2 g/dL — ABNORMAL LOW (ref 12.0–15.0)
Immature Granulocytes: 0 %
Lymphocytes Relative: 26 %
Lymphs Abs: 1.2 10*3/uL (ref 0.7–4.0)
MCH: 25.8 pg — ABNORMAL LOW (ref 26.0–34.0)
MCHC: 31.5 g/dL (ref 30.0–36.0)
MCV: 82 fL (ref 80.0–100.0)
Monocytes Absolute: 0.6 10*3/uL (ref 0.1–1.0)
Monocytes Relative: 12 %
Neutro Abs: 2.8 10*3/uL (ref 1.7–7.7)
Neutrophils Relative %: 58 %
Platelet Count: 297 10*3/uL (ref 150–400)
RBC: 3.56 MIL/uL — ABNORMAL LOW (ref 3.87–5.11)
RDW: 15.9 % — ABNORMAL HIGH (ref 11.5–15.5)
WBC Count: 4.7 10*3/uL (ref 4.0–10.5)
nRBC: 0 % (ref 0.0–0.2)

## 2020-04-20 LAB — RETIC PANEL
Immature Retic Fract: 15 % (ref 2.3–15.9)
RBC.: 3.58 MIL/uL — ABNORMAL LOW (ref 3.87–5.11)
Retic Count, Absolute: 55.5 10*3/uL (ref 19.0–186.0)
Retic Ct Pct: 1.6 % (ref 0.4–3.1)
Reticulocyte Hemoglobin: 29.9 pg (ref >27.9)

## 2020-04-20 LAB — IRON AND TIBC
Iron: 53 ug/dL (ref 41–142)
Saturation Ratios: 19 % — ABNORMAL LOW (ref 21–57)
TIBC: 276 ug/dL (ref 236–444)
UIBC: 223 ug/dL (ref 120–384)

## 2020-04-24 ENCOUNTER — Telehealth: Payer: Self-pay | Admitting: Hematology and Oncology

## 2020-04-24 NOTE — Telephone Encounter (Signed)
Scheduled per 11/19 los. Called pt and left a msg

## 2020-06-25 ENCOUNTER — Telehealth: Payer: Self-pay | Admitting: Hematology and Oncology

## 2020-06-25 NOTE — Telephone Encounter (Signed)
Left message with rescheduled appointment. Gave option to call back to reschedule if needed. 

## 2020-07-23 ENCOUNTER — Other Ambulatory Visit: Payer: Medicare HMO

## 2020-07-23 ENCOUNTER — Ambulatory Visit: Payer: Medicare HMO | Admitting: Hematology and Oncology

## 2020-07-29 ENCOUNTER — Other Ambulatory Visit: Payer: Self-pay | Admitting: Hematology and Oncology

## 2020-07-29 DIAGNOSIS — D500 Iron deficiency anemia secondary to blood loss (chronic): Secondary | ICD-10-CM

## 2020-07-29 NOTE — Progress Notes (Signed)
Between [phone]   [phone]  PROGRESS NOTE  Patient Care Team: Rogers Blocker, MD as PCP - General (Internal Medicine)  Hematological/Oncological History # Iron Deficiency Anemia due to Chronic Blood Loss # Sickle Cell Trait  1) 03/26/2009: Hgb 7.2, first Hgb on record 2) 09/02/2011: 2 units of PRBC at [facility] (Hgb noted at 8.9).  3)  12/03/2011: colonoscopy, found colon polyp and sigmoid diverticula. 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 Underwent argon plasma coagulation  4) Jan 2018: colonoscopy with 2 tubular adenomas removed. Small colonic AVM and small internal hemorrhoids.  5)10/24/2017: Hgb 7.7, MCV 71, Plt 338, and WBC 6.7. POEM procedure for Achalasia. duodenal AVM fulgurated.  6) 03/2018: Hgb 8.2.  7) March 2020: capsule endoscopy: nonbleeding gastric and proximal small bowel AVM,  8) 04/26/2019: Establish Care with Dr. Lorenso Courier  WBC 6.1, Hgb 9.3, MCV 75.3, Plt 289 9) 05/17/2019: Underwent EGD which showed esophagitis and small non-bleeding duodenal angiodysplasias. 10) 07/27/2019: WBC 5.9, Hgb 9.8, MCV 77.8, Plt 271. Recommend IV iron based on labs. Received feraheme 510mg  IV q7 days x 2 doses on 3/8 and 08/15/2019. Hgb electrophoresis confirms sickle cell trait.  11) 10/24/2019: WBC 5.4, Hgb 10.2, MCV 80.5, Plt 252 12) 02/15/2020: WBC 5.1, Hgb 9.7, MCV 80.2, Plt 280 13) 9/24-10/06/2019: IV feraheme 510mg  x 2 doses  Interval History:  Lacey Vega 70 y.o. female with medical history significant for iron deficiency anemia 2/2 to chronic blood loss/ sickle cell trait who presents for a follow up visit. The patient's last visit was on 04/20/2020. In the interim since the last visit Lacey Vega had no further interventions, surgeries, or hospitalizations.   On exam today Lacey Vega notes she has been well in the interim since her last visit.  She notes that she did have a nosebleed yesterday, but these are in frequent  occurrences.  She knows she is had no other overt sources of bleeding such as gum bleeding, bright red blood in the stool, or blood in the urine.  She notes that she does not have issues with shortness of breath, however she "does not go anywhere".  She reports that she sleeps well typically, though some nights she stays up all night and sleeps most of the day.  Her appetite is otherwise good and she has been taking her iron pills without any GI upset.  She has had no issues with fevers, chills, sweats, nausea, vomiting or diarrhea.  A full 10 point ROS is listed below.  MEDICAL HISTORY:  Past Medical History:  Diagnosis Date  . Anemia   . Anxiety   . Asthma   . Asthma   . Bronchitis    hx  . Colon polyps    hyperplastic  . Diabetes ([place])    mild  . Diabetes mellitus   . DJD (degenerative joint disease), cervical   . Dysphagia    history of  . Dyspnea   . Gallstones   . GERD (gastroesophageal reflux disease)   . H/O chest pain    secondary to anemia  . Headache   . History of arteriovenous malformation (AVM)   . History of hiatal hernia   . History of tobacco use   . Hyperlipemia   . Hypertension   . Hypothyroidism   . Iron deficiency anemia   . Sickle cell anemia (HCC)    "patient is not aware of this"  . Thyroid disease   . TIA (transient  ischemic attack)    "was told that she possible had one"    SURGICAL HISTORY: Past Surgical History:  Procedure Laterality Date  . ABDOMINAL HYSTERECTOMY    . ANTERIOR CERVICAL DECOMP/DISCECTOMY FUSION N/A 10/03/2016   Procedure: ANTERIOR CERVICAL DECOMPRESSION/DISCECTOMY FUSION CERVICAL THREE- CERVICAL FOUR;  Surgeon: Ashok Pall, MD;  Location: [place];  Service: Neurosurgery;  Laterality: N/A;  Right side approach  . BOTOX INJECTION  08/29/2015   Procedure: BOTOX INJECTION;  Surgeon: Wilford Corner, MD;  Location: [place];  Service: Endoscopy;;  . CHOLECYSTECTOMY    . COLONOSCOPY  12/03/2011   Procedure: COLONOSCOPY;  Surgeon:  [name], MD;  Location: [facility];  Service: Endoscopy;  Laterality: N/A;  . ESOPHAGEAL MANOMETRY N/A 07/13/2017   Procedure: ESOPHAGEAL MANOMETRY (EM);  Surgeon: Wilford Corner, MD;  Location: [facility];  Service: Endoscopy;  Laterality: N/A;  . ESOPHAGOGASTRODUODENOSCOPY  12/03/2011   Procedure: ESOPHAGOGASTRODUODENOSCOPY (EGD);  Surgeon: [name], MD;  Location: [facility];  Service: Endoscopy;  Laterality: N/A;  . ESOPHAGOGASTRODUODENOSCOPY (EGD) WITH PROPOFOL N/A 08/29/2015   Procedure: ESOPHAGOGASTRODUODENOSCOPY (EGD) WITH PROPOFOL;  Surgeon: Wilford Corner, MD;  Location[facility];  Service: Endoscopy;  Laterality: N/A;  . ESOPHAGOGASTRODUODENOSCOPY (EGD) WITH PROPOFOL N/A 05/17/2019   Procedure: ESOPHAGOGASTRODUODENOSCOPY (EGD) WITH PROPOFOL;  Surgeon: Wilford Corner, MD;  Location: [facility];  Service: Endoscopy;  Laterality: N/A;  . GIVENS CAPSULE STUDY N/A 08/05/2018   Procedure: GIVENS CAPSULE STUDY;  Surgeon: Wilford Corner, MD;  Location: [place];  Service: Endoscopy;  Laterality: N/A;  . HOT HEMOSTASIS  12/03/2011   Procedure: HOT HEMOSTASIS (ARGON PLASMA COAGULATION/BICAP);  Surgeon: [name], MD;  Location: [facility];  Service: Endoscopy;  Laterality: N/A;  . NECK SURGERY    . ROTATOR CUFF REPAIR     right  . ROTATOR CUFF REPAIR Right 2014  . TONSILLECTOMY      SOCIAL HISTORY: Social History   Socioeconomic History  . Marital status: Divorced    Spouse name: Not on file  . Number of children: 2  . Years of education: Not on file  . Highest education level: Not on file  Occupational History  . Occupation: group Designer, multimedia: [employer]    Comment: [employer]  Tobacco Use  . Smoking status: Former Smoker    Types: Cigarettes    Quit date: 06/02/2000    Years since quitting: 20.1  . Smokeless tobacco: Never Used  Vaping Use  . Vaping Use: Never used  Substance  and Sexual Activity  . Alcohol use: No  . Drug use: No  . Sexual activity: Not on file  Other Topics Concern  . Not on file  Social History Narrative   ** Merged History Encounter **       Social Determinants of Health   Financial Resource Strain: Not on file  Food Insecurity: Not on file  Transportation Needs: Not on file  Physical Activity: Not on file  Stress: Not on file  Social Connections: Not on file  Intimate Partner Violence: Not on file    FAMILY HISTORY: Family History  Problem Relation Age of Onset  . Diabetes Mother   . Heart attack Brother   . Cancer Maternal Aunt     ALLERGIES:  is allergic to oxycodone and oxycodone.  MEDICATIONS:  Current Outpatient Medications  Medication Sig Dispense Refill  . albuterol (VENTOLIN HFA) 108 (90 Base) MCG/ACT inhaler Inhale 2 puffs into the lungs every  6 (six) hours as needed for wheezing or shortness of breath.     Marland Kitchen aspirin EC 81 MG tablet Take 81 mg by mouth daily.    Marland Kitchen atorvastatin (LIPITOR) 40 MG tablet Take 40 mg by mouth daily.    . Biotin 5000 MCG TABS Take 2 tablets by mouth daily.     . budesonide-formoterol (SYMBICORT) 160-4.5 MCG/ACT inhaler Inhale 2 puffs into the lungs 2 (two) times daily as needed (for shortness of breath).    . carisoprodol (SOMA) 250 MG tablet Take 250 mg by mouth 2 (two) times daily as needed (cramps).     . Cholecalciferol (D3) 50 MCG (2000 UT) TABS Take 1 tablet by mouth daily.    . Cyanocobalamin (VITAMIN B 12) 500 MCG TABS Take 1,000 mg by mouth every morning.    . docusate sodium (COLACE) 100 MG capsule Take 100 mg by mouth daily.    Marland Kitchen donepezil (ARICEPT) 5 MG tablet Take 5 mg by mouth at bedtime.    . ferrous sulfate ([employer] IRON) 325 (65 FE) MG tablet TAKE 1 TABLET EVERY DAY . PLEASE TAKE 1 PILL DAILY WITH ORANGE JUICE OR 500 UNITS OF VITAMIN C 90 tablet 1  . furosemide (LASIX) 20 MG tablet Take 20 mg by mouth daily.     Marland Kitchen levothyroxine (SYNTHROID) 137 MCG tablet Take 137 mcg by  mouth daily before breakfast.    . Magnesium Oxide 420 (252 Mg) MG TABS Take 420 mg by mouth daily.    . montelukast (SINGULAIR) 10 MG tablet Take 10 mg by mouth daily.      . Multiple Vitamins-Minerals (CENTRUM SILVER PO) Take 2 tablets by mouth daily. Gummies    . nitroGLYCERIN (NITROSTAT) 0.4 MG SL tablet Place 0.4 mg under the tongue every 5 (five) minutes as needed for chest pain.    Marland Kitchen omeprazole (PRILOSEC) 40 MG capsule Take 40 mg by mouth daily.    . vitamin E 180 MG (400 UNITS) capsule Take 400 Units by mouth daily.     No current facility-administered medications for this visit.   Facility-Administered Medications Ordered in Other Visits  Medication Dose Route Frequency Provider Last Rate Last Admin  . 0.9 %  sodium chloride infusion   Intravenous Continuous Wilford Corner, MD        REVIEW OF SYSTEMS:   Constitutional: ( - ) fevers, ( - )  chills , ( - ) night sweats (+) fatigue Eyes: ( - ) blurriness of vision, ( - ) double vision, ( - ) watery eyes Ears, nose, mouth, throat, and face: ( - ) mucositis, ( - ) sore throat Respiratory: ( - ) cough, ( - ) dyspnea, ( - ) wheezes Cardiovascular: ( - ) palpitation, ( - ) chest discomfort, ( - ) lower extremity swelling Gastrointestinal:  ( - ) nausea, ( - ) heartburn, ( - ) change in bowel habits Skin: ( - ) abnormal skin rashes Lymphatics: ( - ) new lymphadenopathy, ( - ) easy bruising Neurological: ( - ) numbness, ( - ) tingling, ( - ) new weaknesses Behavioral/Psych: ( - ) mood change, ( - ) new changes  All other systems were reviewed with the patient and are negative.  PHYSICAL EXAMINATION: ECOG PERFORMANCE STATUS: 1 - Symptomatic but completely ambulatory  Vitals:   07/30/20 0954  BP: 129/65  Pulse: 65  Resp: 17  Temp: 98.5 F (36.9 C)  SpO2: 99%   Filed Weights   07/30/20 0954  Weight: 192  lb 3.2 oz (87.2 kg)    GENERAL: well appearing elderly African American female in NAD  SKIN: skin color, texture,  turgor are normal, no rashes or significant lesions EYES: conjunctiva are pink and non-injected, sclera clear LUNGS: clear to auscultation and percussion with normal breathing effort HEART: regular rate & rhythm and no murmurs and no lower extremity edema Musculoskeletal: no cyanosis of digits and no clubbing  PSYCH: alert & oriented x 3, fluent speech NEURO: no focal motor/sensory deficits  LABORATORY DATA:  I have reviewed the data as listed CBC Latest Ref Rng & Units 07/30/2020 04/20/2020 02/15/2020  WBC 4.0 - 10.5 K/uL 4.9 4.7 5.1  Hemoglobin 12.0 - 15.0 g/dL 10.3(L) 9.2(L) 9.7(L)  Hematocrit 36.0 - 46.0 % 33.4(L) 29.2(L) 31.2(L)  Platelets 150 - 400 K/uL 314 297 280    CMP Latest Ref Rng & Units 07/30/2020 04/20/2020 02/15/2020  Glucose 70 - 99 mg/dL 110(H) 108(H) 109(H)  BUN 8 - 23 mg/dL 12 8 15   Creatinine 0.44 - 1.00 mg/dL 1.03(H) 0.86 0.98  Sodium 135 - 145 mmol/L 142 144 144  Potassium 3.5 - 5.1 mmol/L 4.3 3.3(L) 3.9  Chloride 98 - 111 mmol/L 108 109 109  CO2 22 - 32 mmol/L 27 28 25   Calcium 8.9 - 10.3 mg/dL 9.3 9.3 9.1  Total Protein 6.5 - 8.1 g/dL 7.6 7.0 7.0  Total Bilirubin 0.3 - 1.2 mg/dL 0.6 1.0 0.5  Alkaline Phos 38 - 126 U/L 128(H) 124 136(H)  AST 15 - 41 U/L 14(L) 13(L) 17  ALT 0 - 44 U/L 7 9 11     RADIOGRAPHIC STUDIES: None relevant to review.  No results found.  ASSESSMENT & [place] 70 y.o. female with medical history significant for iron deficiency anemia 2/2 to chronic blood loss presents for a follow up visit.  Ms. Vega establish care with [place] in November 2020 and has been taking p.o. iron since that time.  When her levels failed to improve at her last visit she was started on IV Iron therapy and received IV feraheme 510mg  x 2 doses on 3/8 and 08/15/19. She received additional doses on 9/24 and 03/02/2020.   On exam today Lacey Vega notes she is tolerating PO iron well and her Hgb is at her approximate baseline. She has had no major changes in  health.  We will plan to have Lacey Vega in approximately 3 months time in order to reassess or sooner if she were to develop new symptoms or other concerning findings were noted on her pending labs.  #Iron Deficiency Anemia from Chronic Blood Loss --continue PO ferrous sulfide 325mg  daily with source of vitamin C. --source of blood loss is clearly identified as AVMs in the GI tract. No need for repeat endoscopy at this time. Likely a strong contribution from the patient's sickle cell trait preventing her from reaching a normal Hgb. Baseline in approximately 10.  --transfusion goal for Hgb <7.0.  --patient received IV feraheme 510mg  x2 dose on 9/24-10/06/2019 --if levels remain low will schedule the patient for a repeat round of IV feraheme.  --RTC in 3 months to reassess  #Sickle Cell Trait -- Hgb electrophoresis confirmed sickle cell trait on 07/27/2019. --like a strong contributing factor to the patients persistently low Hgb.    #Bleeding AVM/GI Bleeding --currently followed by [facility] --last EGD on 05/17/2019, showed esophagitis and small non-bleeding duodenal angiodysplasias.  --continue to monitor.   No orders of the defined types were placed in this  encounter.  All questions were answered. The patient knows to call the clinic with any problems, questions or concerns.  A total of more than 30 minutes were spent on this encounter and over half of that time was spent on counseling and coordination of care as outlined above.   Ledell Peoples, MD Department of Hematology/Oncology [place] at [facility] Phone: [phone] Pager: [phone] Email: [email or web address]  07/30/2020 11:00 AM

## 2020-07-30 ENCOUNTER — Encounter: Payer: Self-pay | Admitting: Hematology and Oncology

## 2020-07-30 ENCOUNTER — Inpatient Hospital Stay: Payer: Medicare HMO | Attending: Hematology and Oncology

## 2020-07-30 ENCOUNTER — Other Ambulatory Visit: Payer: Self-pay

## 2020-07-30 ENCOUNTER — Inpatient Hospital Stay (HOSPITAL_BASED_OUTPATIENT_CLINIC_OR_DEPARTMENT_OTHER): Payer: Medicare HMO | Admitting: Hematology and Oncology

## 2020-07-30 VITALS — BP 129/65 | HR 65 | Temp 98.5°F | Resp 17 | Ht 67.0 in | Wt 192.2 lb

## 2020-07-30 DIAGNOSIS — Z7982 Long term (current) use of aspirin: Secondary | ICD-10-CM | POA: Diagnosis not present

## 2020-07-30 DIAGNOSIS — I10 Essential (primary) hypertension: Secondary | ICD-10-CM | POA: Diagnosis not present

## 2020-07-30 DIAGNOSIS — Z809 Family history of malignant neoplasm, unspecified: Secondary | ICD-10-CM | POA: Diagnosis not present

## 2020-07-30 DIAGNOSIS — K2100 Gastro-esophageal reflux disease with esophagitis, without bleeding: Secondary | ICD-10-CM | POA: Diagnosis not present

## 2020-07-30 DIAGNOSIS — Z90710 Acquired absence of both cervix and uterus: Secondary | ICD-10-CM | POA: Diagnosis not present

## 2020-07-30 DIAGNOSIS — J45909 Unspecified asthma, uncomplicated: Secondary | ICD-10-CM | POA: Insufficient documentation

## 2020-07-30 DIAGNOSIS — Z833 Family history of diabetes mellitus: Secondary | ICD-10-CM | POA: Diagnosis not present

## 2020-07-30 DIAGNOSIS — Z7951 Long term (current) use of inhaled steroids: Secondary | ICD-10-CM | POA: Diagnosis not present

## 2020-07-30 DIAGNOSIS — E039 Hypothyroidism, unspecified: Secondary | ICD-10-CM | POA: Insufficient documentation

## 2020-07-30 DIAGNOSIS — Z79899 Other long term (current) drug therapy: Secondary | ICD-10-CM | POA: Insufficient documentation

## 2020-07-30 DIAGNOSIS — Z87891 Personal history of nicotine dependence: Secondary | ICD-10-CM | POA: Insufficient documentation

## 2020-07-30 DIAGNOSIS — D573 Sickle-cell trait: Secondary | ICD-10-CM | POA: Diagnosis not present

## 2020-07-30 DIAGNOSIS — D500 Iron deficiency anemia secondary to blood loss (chronic): Secondary | ICD-10-CM | POA: Insufficient documentation

## 2020-07-30 DIAGNOSIS — E785 Hyperlipidemia, unspecified: Secondary | ICD-10-CM | POA: Insufficient documentation

## 2020-07-30 DIAGNOSIS — E119 Type 2 diabetes mellitus without complications: Secondary | ICD-10-CM | POA: Diagnosis not present

## 2020-07-30 DIAGNOSIS — Z8249 Family history of ischemic heart disease and other diseases of the circulatory system: Secondary | ICD-10-CM | POA: Insufficient documentation

## 2020-07-30 DIAGNOSIS — Z8673 Personal history of transient ischemic attack (TIA), and cerebral infarction without residual deficits: Secondary | ICD-10-CM | POA: Insufficient documentation

## 2020-07-30 LAB — CBC WITH DIFFERENTIAL (CANCER CENTER ONLY)
Abs Immature Granulocytes: 0.01 10*3/uL (ref 0.00–0.07)
Basophils Absolute: 0 10*3/uL (ref 0.0–0.1)
Basophils Relative: 1 %
Eosinophils Absolute: 0.2 10*3/uL (ref 0.0–0.5)
Eosinophils Relative: 4 %
HCT: 33.4 % — ABNORMAL LOW (ref 36.0–46.0)
Hemoglobin: 10.3 g/dL — ABNORMAL LOW (ref 12.0–15.0)
Immature Granulocytes: 0 %
Lymphocytes Relative: 28 %
Lymphs Abs: 1.4 10*3/uL (ref 0.7–4.0)
MCH: 24.1 pg — ABNORMAL LOW (ref 26.0–34.0)
MCHC: 30.8 g/dL (ref 30.0–36.0)
MCV: 78.2 fL — ABNORMAL LOW (ref 80.0–100.0)
Monocytes Absolute: 0.6 10*3/uL (ref 0.1–1.0)
Monocytes Relative: 11 %
Neutro Abs: 2.7 10*3/uL (ref 1.7–7.7)
Neutrophils Relative %: 56 %
Platelet Count: 314 10*3/uL (ref 150–400)
RBC: 4.27 MIL/uL (ref 3.87–5.11)
RDW: 14.1 % (ref 11.5–15.5)
WBC Count: 4.9 10*3/uL (ref 4.0–10.5)
nRBC: 0 % (ref 0.0–0.2)

## 2020-07-30 LAB — CMP (CANCER CENTER ONLY)
ALT: 7 U/L (ref 0–44)
AST: 14 U/L — ABNORMAL LOW (ref 15–41)
Albumin: 3.7 g/dL (ref 3.5–5.0)
Alkaline Phosphatase: 128 U/L — ABNORMAL HIGH (ref 38–126)
Anion gap: 7 (ref 5–15)
BUN: 12 mg/dL (ref 8–23)
CO2: 27 mmol/L (ref 22–32)
Calcium: 9.3 mg/dL (ref 8.9–10.3)
Chloride: 108 mmol/L (ref 98–111)
Creatinine: 1.03 mg/dL — ABNORMAL HIGH (ref 0.44–1.00)
GFR, Estimated: 58 mL/min — ABNORMAL LOW (ref >60)
Glucose, Bld: 110 mg/dL — ABNORMAL HIGH (ref 70–99)
Potassium: 4.3 mmol/L (ref 3.5–5.1)
Sodium: 142 mmol/L (ref 135–145)
Total Bilirubin: 0.6 mg/dL (ref 0.3–1.2)
Total Protein: 7.6 g/dL (ref 6.5–8.1)

## 2020-07-30 LAB — IRON AND TIBC
Iron: 41 ug/dL (ref 41–142)
Saturation Ratios: 12 % — ABNORMAL LOW (ref 21–57)
TIBC: 343 ug/dL (ref 236–444)
UIBC: 301 ug/dL (ref 120–384)

## 2020-07-30 LAB — RETIC PANEL
Immature Retic Fract: 14.4 % (ref 2.3–15.9)
RBC.: 4.18 MIL/uL (ref 3.87–5.11)
Retic Count, Absolute: 38 10*3/uL (ref 19.0–186.0)
Retic Ct Pct: 0.9 % (ref 0.4–3.1)
Reticulocyte Hemoglobin: 27.4 pg — ABNORMAL LOW (ref >27.9)

## 2020-07-30 LAB — FERRITIN: Ferritin: 72 ng/mL (ref 11–307)

## 2020-07-31 ENCOUNTER — Telehealth: Payer: Self-pay | Admitting: Hematology and Oncology

## 2020-07-31 NOTE — Telephone Encounter (Signed)
Scheduled per 2/28 los. Called pt and left a msg  

## 2020-08-01 ENCOUNTER — Telehealth: Payer: Self-pay | Admitting: *Deleted

## 2020-08-01 NOTE — Telephone Encounter (Signed)
Received call back from patient. Advised that her iron levels are stable and that does not need an iron infusion per Dr. Lorenso Courier. Advised that we will see her back in 3 months. She voiced understanding.

## 2020-08-01 NOTE — Telephone Encounter (Signed)
TCT patient regarding her recent lab results. No answer but was able to leave message for pt to return this call at her convenience to [phone]

## 2020-08-01 NOTE — Telephone Encounter (Signed)
-----   Message from Orson Slick, MD sent at 07/31/2020  2:35 PM EST ----- Please let Mrs. Delair know that her iron levels are steady. There is no need for an iron infusion at this time. We will see her back in 3 months.   ----- Message ----- From: Buel Ream, Lab In [place] Sent: 07/30/2020   9:35 AM EST To: Orson Slick, MD

## 2020-11-04 ENCOUNTER — Other Ambulatory Visit: Payer: Self-pay | Admitting: Hematology and Oncology

## 2020-11-04 DIAGNOSIS — D500 Iron deficiency anemia secondary to blood loss (chronic): Secondary | ICD-10-CM

## 2020-11-04 NOTE — Progress Notes (Signed)
[name]   [phone]  PROGRESS NOTE  Patient Care Team: Rogers Blocker, MD as PCP - General (Internal Medicine)  Hematological/Oncological History # Iron Deficiency Anemia due to Chronic Blood Loss # Sickle Cell Trait  1) 03/26/2009: Hgb 7.2, first Hgb on record 2) 09/02/2011: 2 units of PRBC at [facility] (Hgb noted at 8.9).  3)  12/03/2011: colonoscopy, found colon polyp and sigmoid diverticula. 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 Underwent argon plasma coagulation  4) Jan 2018: colonoscopy with 2 tubular adenomas removed. Small colonic AVM and small internal hemorrhoids.  5)10/24/2017: Hgb 7.7, MCV 71, Plt 338, and WBC 6.7. POEM procedure for Achalasia. duodenal AVM fulgurated.  6) 03/2018: Hgb 8.2.  7) March 2020: capsule endoscopy: nonbleeding gastric and proximal small bowel AVM,  8) 04/26/2019: Establish Care with Dr. Lorenso Courier  WBC 6.1, Hgb 9.3, MCV 75.3, Plt 289 9) 05/17/2019: Underwent EGD which showed esophagitis and small non-bleeding duodenal angiodysplasias. 10) 07/27/2019: WBC 5.9, Hgb 9.8, MCV 77.8, Plt 271. Recommend IV iron based on labs. Received feraheme 510mg  IV q7 days x 2 doses on 3/8 and 08/15/2019. Hgb electrophoresis confirms sickle cell trait.  11) 10/24/2019: WBC 5.4, Hgb 10.2, MCV 80.5, Plt 252 12) 02/15/2020: WBC 5.1, Hgb 9.7, MCV 80.2, Plt 280 13) 9/24-10/06/2019: IV feraheme 510mg  x 2 doses  Interval History:  Lacey Vega 69 y.o. female with medical history significant for iron deficiency anemia 2/2 to chronic blood loss/ sickle cell trait who presents for a follow up visit. The patient's last visit was on 07/31/2019. In the interim since the last visit Lacey Vega had no further interventions, surgeries, or hospitalizations.   On exam today Lacey Vega notes she is had no major changes in her health since her last visit.  She reports her energy is good and she is not having any issues with  shortness of breath.  She notes that she is tolerating iron pills well without any upset stomach or nausea.  She also denies any overt signs of bleeding, bruising, or dark bowel movements.  She has had no other issues in the interim since her last visit.  She notes she has not seen GI since her last scope back in December 2020 and is not scheduled to see them for some time.  She has had no issues with fevers, chills, sweats, nausea, vomiting or diarrhea.  A full 10 point ROS is listed below.  MEDICAL HISTORY:  Past Medical History:  Diagnosis Date  . Anemia   . Anxiety   . Asthma   . Asthma   . Bronchitis    hx  . Colon polyps    hyperplastic  . Diabetes ([place])    mild  . Diabetes mellitus   . DJD (degenerative joint disease), cervical   . Dysphagia    history of  . Dyspnea   . Gallstones   . GERD (gastroesophageal reflux disease)   . H/O chest pain    secondary to anemia  . Headache   . History of arteriovenous malformation (AVM)   . History of hiatal hernia   . History of tobacco use   . Hyperlipemia   . Hypertension   . Hypothyroidism   . Iron deficiency anemia   . Sickle cell anemia (HCC)    "patient is not aware of this"  . Thyroid disease   . TIA (transient ischemic attack)    "was told that she possible had one"  SURGICAL HISTORY: Past Surgical History:  Procedure Laterality Date  . ABDOMINAL HYSTERECTOMY    . ANTERIOR CERVICAL DECOMP/DISCECTOMY FUSION N/A 10/03/2016   Procedure: ANTERIOR CERVICAL DECOMPRESSION/DISCECTOMY FUSION CERVICAL THREE- CERVICAL FOUR;  Surgeon: Ashok Pall, MD;  Location: [facility];  Service: Neurosurgery;  Laterality: N/A;  Right side approach  . BOTOX INJECTION  08/29/2015   Procedure: BOTOX INJECTION;  Surgeon: Wilford Corner, MD;  Location: [place] [facility];  Service: Endoscopy;;  . CHOLECYSTECTOMY    . COLONOSCOPY  12/03/2011   Procedure: COLONOSCOPY;  Surgeon: [name], MD;  Location: [facility];  Service: Endoscopy;   Laterality: N/A;  . ESOPHAGEAL MANOMETRY N/A 07/13/2017   Procedure: ESOPHAGEAL MANOMETRY (EM);  Surgeon: Wilford Corner, MD;  Location: [facility];  Service: Endoscopy;  Laterality: N/A;  . ESOPHAGOGASTRODUODENOSCOPY  12/03/2011   Procedure: ESOPHAGOGASTRODUODENOSCOPY (EGD);  Surgeon: [name], MD;  Location: [facility];  Service: Endoscopy;  Laterality: N/A;  . ESOPHAGOGASTRODUODENOSCOPY (EGD) WITH PROPOFOL N/A 08/29/2015   Procedure: ESOPHAGOGASTRODUODENOSCOPY (EGD) WITH PROPOFOL;  Surgeon: Wilford Corner, MD;  Location[facility];  Service: Endoscopy;  Laterality: N/A;  . ESOPHAGOGASTRODUODENOSCOPY (EGD) WITH PROPOFOL N/A 05/17/2019   Procedure: ESOPHAGOGASTRODUODENOSCOPY (EGD) WITH PROPOFOL;  Surgeon: Wilford Corner, MD;  Location: [facility];  Service: Endoscopy;  Laterality: N/A;  . GIVENS CAPSULE STUDY N/A 08/05/2018   Procedure: GIVENS CAPSULE STUDY;  Surgeon: Wilford Corner, MD;  Location: [place];  Service: Endoscopy;  Laterality: N/A;  . HOT HEMOSTASIS  12/03/2011   Procedure: HOT HEMOSTASIS (ARGON PLASMA COAGULATION/BICAP);  Surgeon: [name], MD;  Location: [facility];  Service: Endoscopy;  Laterality: N/A;  . NECK SURGERY    . ROTATOR CUFF REPAIR     right  . ROTATOR CUFF REPAIR Right 2014  . TONSILLECTOMY      SOCIAL HISTORY: Social History   Socioeconomic History  . Marital status: Divorced    Spouse name: Not on file  . Number of children: 2  . Years of education: Not on file  . Highest education level: Not on file  Occupational History  . Occupation: group Designer, multimedia: [employer]    Comment: [employer]  Tobacco Use  . Smoking status: Former Smoker    Types: Cigarettes    Quit date: 06/02/2000    Years since quitting: 20.4  . Smokeless tobacco: Never Used  Vaping Use  . Vaping Use: Never used  Substance and Sexual Activity  . Alcohol use: No  . Drug use: No  . Sexual  activity: Not on file  Other Topics Concern  . Not on file  Social History Narrative   ** Merged History Encounter **       Social Determinants of Health   Financial Resource Strain: Not on file  Food Insecurity: Not on file  Transportation Needs: Not on file  Physical Activity: Not on file  Stress: Not on file  Social Connections: Not on file  Intimate Partner Violence: Not on file    FAMILY HISTORY: Family History  Problem Relation Age of Onset  . Diabetes Mother   . Heart attack Brother   . Cancer Maternal Aunt     ALLERGIES:  is allergic to oxycodone and oxycodone.  MEDICATIONS:  Current Outpatient Medications  Medication Sig Dispense Refill  . albuterol (VENTOLIN HFA) 108 (90 Base) MCG/ACT inhaler Inhale 2 puffs into the lungs every 6 (six) hours as needed for wheezing or shortness of breath.     Marland Kitchen  aspirin EC 81 MG tablet Take 81 mg by mouth daily.    Marland Kitchen atorvastatin (LIPITOR) 40 MG tablet Take 40 mg by mouth daily.    . Biotin 5000 MCG TABS Take 2 tablets by mouth daily.     . budesonide-formoterol (SYMBICORT) 160-4.5 MCG/ACT inhaler Inhale 2 puffs into the lungs 2 (two) times daily as needed (for shortness of breath).    . carisoprodol (SOMA) 250 MG tablet Take 250 mg by mouth 2 (two) times daily as needed (cramps).     . Cholecalciferol (D3) 50 MCG (2000 UT) TABS Take 1 tablet by mouth daily.    . Cyanocobalamin (VITAMIN B 12) 500 MCG TABS Take 1,000 mg by mouth every morning.    . docusate sodium (COLACE) 100 MG capsule Take 100 mg by mouth daily.    Marland Kitchen donepezil (ARICEPT) 5 MG tablet Take 5 mg by mouth at bedtime.    . ferrous sulfate ([employer] IRON) 325 (65 FE) MG tablet TAKE 1 TABLET EVERY DAY . PLEASE TAKE 1 PILL DAILY WITH ORANGE JUICE OR 500 UNITS OF VITAMIN C 90 tablet 1  . furosemide (LASIX) 20 MG tablet Take 20 mg by mouth daily.     Marland Kitchen levothyroxine (SYNTHROID) 137 MCG tablet Take 137 mcg by mouth daily before breakfast.    . Magnesium Oxide 420 (252 Mg) MG TABS  Take 420 mg by mouth daily.    . montelukast (SINGULAIR) 10 MG tablet Take 10 mg by mouth daily.      . Multiple Vitamins-Minerals (CENTRUM SILVER PO) Take 2 tablets by mouth daily. Gummies    . nitroGLYCERIN (NITROSTAT) 0.4 MG SL tablet Place 0.4 mg under the tongue every 5 (five) minutes as needed for chest pain.    Marland Kitchen omeprazole (PRILOSEC) 40 MG capsule Take 40 mg by mouth daily.    . vitamin E 180 MG (400 UNITS) capsule Take 400 Units by mouth daily.     No current facility-administered medications for this visit.   Facility-Administered Medications Ordered in Other Visits  Medication Dose Route Frequency Provider Last Rate Last Admin  . 0.9 %  sodium chloride infusion   Intravenous Continuous Wilford Corner, MD        REVIEW OF SYSTEMS:   Constitutional: ( - ) fevers, ( - )  chills , ( - ) night sweats (+) fatigue Eyes: ( - ) blurriness of vision, ( - ) double vision, ( - ) watery eyes Ears, nose, mouth, throat, and face: ( - ) mucositis, ( - ) sore throat Respiratory: ( - ) cough, ( - ) dyspnea, ( - ) wheezes Cardiovascular: ( - ) palpitation, ( - ) chest discomfort, ( - ) lower extremity swelling Gastrointestinal:  ( - ) nausea, ( - ) heartburn, ( - ) change in bowel habits Skin: ( - ) abnormal skin rashes Lymphatics: ( - ) new lymphadenopathy, ( - ) easy bruising Neurological: ( - ) numbness, ( - ) tingling, ( - ) new weaknesses Behavioral/Psych: ( - ) mood change, ( - ) new changes  All other systems were reviewed with the patient and are negative.  PHYSICAL EXAMINATION: ECOG PERFORMANCE STATUS: 1 - Symptomatic but completely ambulatory  Vitals:   11/05/20 1036  BP: 119/67  Pulse: 95  Resp: 18  Temp: (!) 97.1 F (36.2 C)  SpO2: 99%   Filed Weights   11/05/20 1036  Weight: 192 lb 3.2 oz (87.2 kg)    GENERAL: well appearing elderly African American female  in NAD  SKIN: skin color, texture, turgor are normal, no rashes or significant lesions EYES: conjunctiva are  pink and non-injected, sclera clear LUNGS: clear to auscultation and percussion with normal breathing effort HEART: regular rate & rhythm and no murmurs and no lower extremity edema Musculoskeletal: no cyanosis of digits and no clubbing  PSYCH: alert & oriented x 3, fluent speech NEURO: no focal motor/sensory deficits  LABORATORY DATA:  I have reviewed the data as listed CBC Latest Ref Rng & Units 11/05/2020 07/30/2020 04/20/2020  WBC 4.0 - 10.5 K/uL 6.2 4.9 4.7  Hemoglobin 12.0 - 15.0 g/dL 9.6(L) 10.3(L) 9.2(L)  Hematocrit 36.0 - 46.0 % 31.1(L) 33.4(L) 29.2(L)  Platelets 150 - 400 K/uL 308 314 297    CMP Latest Ref Rng & Units 11/05/2020 07/30/2020 04/20/2020  Glucose 70 - 99 mg/dL 113(H) 110(H) 108(H)  BUN 8 - 23 mg/dL 15 12 8   Creatinine 0.44 - 1.00 mg/dL 1.00 1.03(H) 0.86  Sodium 135 - 145 mmol/L 144 142 144  Potassium 3.5 - 5.1 mmol/L 4.0 4.3 3.3(L)  Chloride 98 - 111 mmol/L 108 108 109  CO2 22 - 32 mmol/L 25 27 28   Calcium 8.9 - 10.3 mg/dL 9.2 9.3 9.3  Total Protein 6.5 - 8.1 g/dL 7.3 7.6 7.0  Total Bilirubin 0.3 - 1.2 mg/dL 0.5 0.6 1.0  Alkaline Phos 38 - 126 U/L 112 128(H) 124  AST 15 - 41 U/L 13(L) 14(L) 13(L)  ALT 0 - 44 U/L 6 7 9     RADIOGRAPHIC STUDIES: None relevant to review.  No results found.  ASSESSMENT & [place] 70 y.o. female with medical history significant for iron deficiency anemia 2/2 to chronic blood loss presents for a follow up visit.  Lacey Vega establish care with [place] in November 2020 and has been taking p.o. iron since that time.  When her levels failed to improve at her last visit she was started on IV Iron therapy and received IV feraheme 510mg  x 2 doses on 3/8 and 08/15/19. She received additional doses on 9/24 and 03/02/2020.   On exam today Lacey Vega notes she is tolerating PO iron well and her Hgb is at her approximate baseline. She has had no major changes in health.  We will plan to have Lacey Vega back in approximately 3 months time  in order to reassess or sooner if she were to develop new symptoms or other concerning findings were noted on her pending labs.  #Iron Deficiency Anemia from Chronic Blood Loss --continue PO ferrous sulfide 325mg  daily with source of vitamin C. --source of blood loss is clearly identified as AVMs in the GI tract. No need for repeat endoscopy at this time. Likely a strong contribution from the patient's sickle cell trait preventing her from reaching a normal Hgb. Baseline in approximately 10.  --transfusion goal for Hgb <7.0.  --patient received IV feraheme 510mg  x2 dose on 9/24-10/06/2019 --if levels remain low will schedule the patient for a repeat round of IV feraheme.  --RTC in 3 months to reassess  #Sickle Cell Trait -- Hgb electrophoresis confirmed sickle cell trait on 07/27/2019. --like a strong contributing factor to the patients persistently low Hgb.    #Bleeding AVM/GI Bleeding --currently followed by [facility] --last EGD on 05/17/2019, showed esophagitis and small non-bleeding duodenal angiodysplasias.  --continue to monitor.   No orders of the defined types were placed in this encounter.  All questions were answered. The patient knows to call the clinic with any  problems, questions or concerns.  A total of more than 30 minutes were spent on this encounter and over half of that time was spent on counseling and coordination of care as outlined above.   Ledell Peoples, MD Department of Hematology/Oncology [place] [facility] Phone: [phone] Pager: [phone] Email: [email or web address]  11/05/2020 11:36 AM

## 2020-11-05 ENCOUNTER — Inpatient Hospital Stay: Payer: Medicare HMO

## 2020-11-05 ENCOUNTER — Encounter: Payer: Self-pay | Admitting: Hematology and Oncology

## 2020-11-05 ENCOUNTER — Other Ambulatory Visit: Payer: Self-pay

## 2020-11-05 ENCOUNTER — Inpatient Hospital Stay: Payer: Medicare HMO | Attending: Hematology and Oncology | Admitting: Hematology and Oncology

## 2020-11-05 VITALS — BP 119/67 | HR 95 | Temp 97.1°F | Resp 18 | Ht 67.0 in | Wt 192.2 lb

## 2020-11-05 DIAGNOSIS — D500 Iron deficiency anemia secondary to blood loss (chronic): Secondary | ICD-10-CM | POA: Diagnosis not present

## 2020-11-05 DIAGNOSIS — D573 Sickle-cell trait: Secondary | ICD-10-CM | POA: Diagnosis not present

## 2020-11-05 DIAGNOSIS — Q2730 Arteriovenous malformation, site unspecified: Secondary | ICD-10-CM | POA: Insufficient documentation

## 2020-11-05 LAB — CBC WITH DIFFERENTIAL (CANCER CENTER ONLY)
Abs Immature Granulocytes: 0.02 10*3/uL (ref 0.00–0.07)
Basophils Absolute: 0 10*3/uL (ref 0.0–0.1)
Basophils Relative: 1 %
Eosinophils Absolute: 0.4 10*3/uL (ref 0.0–0.5)
Eosinophils Relative: 6 %
HCT: 31.1 % — ABNORMAL LOW (ref 36.0–46.0)
Hemoglobin: 9.6 g/dL — ABNORMAL LOW (ref 12.0–15.0)
Immature Granulocytes: 0 %
Lymphocytes Relative: 25 %
Lymphs Abs: 1.6 10*3/uL (ref 0.7–4.0)
MCH: 23.5 pg — ABNORMAL LOW (ref 26.0–34.0)
MCHC: 30.9 g/dL (ref 30.0–36.0)
MCV: 76.2 fL — ABNORMAL LOW (ref 80.0–100.0)
Monocytes Absolute: 0.8 10*3/uL (ref 0.1–1.0)
Monocytes Relative: 12 %
Neutro Abs: 3.4 10*3/uL (ref 1.7–7.7)
Neutrophils Relative %: 56 %
Platelet Count: 308 10*3/uL (ref 150–400)
RBC: 4.08 MIL/uL (ref 3.87–5.11)
RDW: 14.7 % (ref 11.5–15.5)
WBC Count: 6.2 10*3/uL (ref 4.0–10.5)
nRBC: 0 % (ref 0.0–0.2)

## 2020-11-05 LAB — CMP (CANCER CENTER ONLY)
ALT: <6 U/L (ref 0–44)
AST: 13 U/L — ABNORMAL LOW (ref 15–41)
Albumin: 3.5 g/dL (ref 3.5–5.0)
Alkaline Phosphatase: 112 U/L (ref 38–126)
Anion gap: 11 (ref 5–15)
BUN: 15 mg/dL (ref 8–23)
CO2: 25 mmol/L (ref 22–32)
Calcium: 9.2 mg/dL (ref 8.9–10.3)
Chloride: 108 mmol/L (ref 98–111)
Creatinine: 1 mg/dL (ref 0.44–1.00)
GFR, Estimated: >60 mL/min (ref >60)
Glucose, Bld: 113 mg/dL — ABNORMAL HIGH (ref 70–99)
Potassium: 4 mmol/L (ref 3.5–5.1)
Sodium: 144 mmol/L (ref 135–145)
Total Bilirubin: 0.5 mg/dL (ref 0.3–1.2)
Total Protein: 7.3 g/dL (ref 6.5–8.1)

## 2020-11-07 ENCOUNTER — Telehealth: Payer: Self-pay | Admitting: Hematology and Oncology

## 2020-11-07 NOTE — Telephone Encounter (Signed)
Scheduled per los. Called and left msg. Mailed printout  °

## 2020-12-17 ENCOUNTER — Other Ambulatory Visit: Payer: Self-pay | Admitting: Internal Medicine

## 2020-12-17 DIAGNOSIS — I714 Abdominal aortic aneurysm, without rupture, unspecified: Secondary | ICD-10-CM

## 2020-12-28 ENCOUNTER — Other Ambulatory Visit: Payer: Medicare HMO

## 2021-01-29 ENCOUNTER — Ambulatory Visit
Admission: RE | Admit: 2021-01-29 | Discharge: 2021-01-29 | Disposition: A | Discharge status: Home / Self Care | Payer: Medicare HMO | Source: Ambulatory Visit | Attending: Internal Medicine | Admitting: Internal Medicine

## 2021-01-29 ENCOUNTER — Other Ambulatory Visit: Payer: Self-pay | Admitting: Internal Medicine

## 2021-01-29 DIAGNOSIS — I714 Abdominal aortic aneurysm, without rupture, unspecified: Secondary | ICD-10-CM

## 2021-01-29 DIAGNOSIS — M545 Low back pain, unspecified: Secondary | ICD-10-CM

## 2021-01-31 ENCOUNTER — Inpatient Hospital Stay (HOSPITAL_COMMUNITY)
Admission: EM | Admit: 2021-01-31 | Discharge: 2021-02-05 | DRG: 271 | Disposition: A | Discharge status: Home / Self Care | Payer: Medicare HMO | Attending: Vascular Surgery | Admitting: Vascular Surgery

## 2021-01-31 ENCOUNTER — Emergency Department (HOSPITAL_COMMUNITY): Payer: Medicare HMO | Admitting: Certified Registered Nurse Anesthetist

## 2021-01-31 ENCOUNTER — Encounter (HOSPITAL_COMMUNITY): Payer: Self-pay | Admitting: Emergency Medicine

## 2021-01-31 ENCOUNTER — Encounter (HOSPITAL_COMMUNITY)
Admission: EM | Disposition: A | Discharge status: Home / Self Care | Payer: Self-pay | Source: Home / Self Care | Attending: Vascular Surgery

## 2021-01-31 ENCOUNTER — Inpatient Hospital Stay (HOSPITAL_COMMUNITY): Payer: Medicare HMO | Admitting: Registered Nurse

## 2021-01-31 ENCOUNTER — Emergency Department (HOSPITAL_COMMUNITY): Payer: Medicare HMO

## 2021-01-31 DIAGNOSIS — E785 Hyperlipidemia, unspecified: Secondary | ICD-10-CM | POA: Diagnosis present

## 2021-01-31 DIAGNOSIS — Z8679 Personal history of other diseases of the circulatory system: Secondary | ICD-10-CM

## 2021-01-31 DIAGNOSIS — I714 Abdominal aortic aneurysm, without rupture, unspecified: Secondary | ICD-10-CM

## 2021-01-31 DIAGNOSIS — I10 Essential (primary) hypertension: Secondary | ICD-10-CM | POA: Diagnosis present

## 2021-01-31 DIAGNOSIS — Z8249 Family history of ischemic heart disease and other diseases of the circulatory system: Secondary | ICD-10-CM

## 2021-01-31 DIAGNOSIS — E119 Type 2 diabetes mellitus without complications: Secondary | ICD-10-CM | POA: Diagnosis present

## 2021-01-31 DIAGNOSIS — Z885 Allergy status to narcotic agent status: Secondary | ICD-10-CM | POA: Diagnosis not present

## 2021-01-31 DIAGNOSIS — Z833 Family history of diabetes mellitus: Secondary | ICD-10-CM | POA: Diagnosis not present

## 2021-01-31 DIAGNOSIS — F419 Anxiety disorder, unspecified: Secondary | ICD-10-CM | POA: Diagnosis present

## 2021-01-31 DIAGNOSIS — Z79899 Other long term (current) drug therapy: Secondary | ICD-10-CM | POA: Diagnosis not present

## 2021-01-31 DIAGNOSIS — Z8616 Personal history of COVID-19: Secondary | ICD-10-CM | POA: Diagnosis not present

## 2021-01-31 DIAGNOSIS — K219 Gastro-esophageal reflux disease without esophagitis: Secondary | ICD-10-CM | POA: Diagnosis present

## 2021-01-31 DIAGNOSIS — Z87891 Personal history of nicotine dependence: Secondary | ICD-10-CM

## 2021-01-31 DIAGNOSIS — Z8673 Personal history of transient ischemic attack (TIA), and cerebral infarction without residual deficits: Secondary | ICD-10-CM

## 2021-01-31 DIAGNOSIS — D62 Acute posthemorrhagic anemia: Secondary | ICD-10-CM | POA: Diagnosis not present

## 2021-01-31 DIAGNOSIS — Z7982 Long term (current) use of aspirin: Secondary | ICD-10-CM

## 2021-01-31 DIAGNOSIS — I719 Aortic aneurysm of unspecified site, without rupture: Secondary | ICD-10-CM | POA: Diagnosis present

## 2021-01-31 HISTORY — PX: ABDOMINAL AORTIC ENDOVASCULAR STENT GRAFT: SHX5707

## 2021-01-31 HISTORY — PX: THROMBECTOMY FEMORAL ARTERY: SHX6406

## 2021-01-31 LAB — CBC WITH DIFFERENTIAL/PLATELET
Abs Immature Granulocytes: 0.02 10*3/uL (ref 0.00–0.07)
Basophils Absolute: 0 10*3/uL (ref 0.0–0.1)
Basophils Relative: 1 %
Eosinophils Absolute: 0.2 10*3/uL (ref 0.0–0.5)
Eosinophils Relative: 3 %
HCT: 32.1 % — ABNORMAL LOW (ref 36.0–46.0)
Hemoglobin: 9.6 g/dL — ABNORMAL LOW (ref 12.0–15.0)
Immature Granulocytes: 0 %
Lymphocytes Relative: 23 %
Lymphs Abs: 1.5 10*3/uL (ref 0.7–4.0)
MCH: 22.3 pg — ABNORMAL LOW (ref 26.0–34.0)
MCHC: 29.9 g/dL — ABNORMAL LOW (ref 30.0–36.0)
MCV: 74.5 fL — ABNORMAL LOW (ref 80.0–100.0)
Monocytes Absolute: 0.6 10*3/uL (ref 0.1–1.0)
Monocytes Relative: 9 %
Neutro Abs: 4 10*3/uL (ref 1.7–7.7)
Neutrophils Relative %: 64 %
Platelets: 335 10*3/uL (ref 150–400)
RBC: 4.31 MIL/uL (ref 3.87–5.11)
RDW: 16.8 % — ABNORMAL HIGH (ref 11.5–15.5)
WBC: 6.3 10*3/uL (ref 4.0–10.5)
nRBC: 0 % (ref 0.0–0.2)

## 2021-01-31 LAB — RESP PANEL BY RT-PCR (FLU A&B, COVID) ARPGX2
Influenza A by PCR: NEGATIVE
Influenza B by PCR: NEGATIVE
SARS Coronavirus 2 by RT PCR: POSITIVE — AB

## 2021-01-31 LAB — COMPREHENSIVE METABOLIC PANEL
ALT: 11 U/L (ref 0–44)
AST: 17 U/L (ref 15–41)
Albumin: 3.6 g/dL (ref 3.5–5.0)
Alkaline Phosphatase: 117 U/L (ref 38–126)
Anion gap: 11 (ref 5–15)
BUN: 14 mg/dL (ref 8–23)
CO2: 23 mmol/L (ref 22–32)
Calcium: 9.2 mg/dL (ref 8.9–10.3)
Chloride: 103 mmol/L (ref 98–111)
Creatinine, Ser: 1.11 mg/dL — ABNORMAL HIGH (ref 0.44–1.00)
GFR, Estimated: 53 mL/min — ABNORMAL LOW (ref >60)
Glucose, Bld: 113 mg/dL — ABNORMAL HIGH (ref 70–99)
Potassium: 4.1 mmol/L (ref 3.5–5.1)
Sodium: 137 mmol/L (ref 135–145)
Total Bilirubin: 0.9 mg/dL (ref 0.3–1.2)
Total Protein: 7.3 g/dL (ref 6.5–8.1)

## 2021-01-31 LAB — I-STAT CHEM 8, ED
BUN: 15 mg/dL (ref 8–23)
Calcium, Ion: 1.23 mmol/L (ref 1.15–1.40)
Chloride: 106 mmol/L (ref 98–111)
Creatinine, Ser: 1.1 mg/dL — ABNORMAL HIGH (ref 0.44–1.00)
Glucose, Bld: 114 mg/dL — ABNORMAL HIGH (ref 70–99)
HCT: 31 % — ABNORMAL LOW (ref 36.0–46.0)
Hemoglobin: 10.5 g/dL — ABNORMAL LOW (ref 12.0–15.0)
Potassium: 4.1 mmol/L (ref 3.5–5.1)
Sodium: 139 mmol/L (ref 135–145)
TCO2: 24 mmol/L (ref 22–32)

## 2021-01-31 LAB — GLUCOSE, CAPILLARY: Glucose-Capillary: 136 mg/dL — ABNORMAL HIGH (ref 70–99)

## 2021-01-31 LAB — LIPASE, BLOOD: Lipase: 38 U/L (ref 11–51)

## 2021-01-31 LAB — PREPARE RBC (CROSSMATCH)

## 2021-01-31 SURGERY — THROMBECTOMY, ARTERY, FEMORAL
Anesthesia: General | Site: Leg Lower | Laterality: Left

## 2021-01-31 SURGERY — INSERTION, ENDOVASCULAR STENT GRAFT, AORTA, ABDOMINAL
Anesthesia: General | Laterality: Bilateral

## 2021-01-31 MED ORDER — SODIUM CHLORIDE 0.9% FLUSH
INTRAVENOUS | Status: DC | PRN
  Administered 2021-01-31: 3 mL

## 2021-01-31 MED ORDER — HEPARIN 6000 UNIT IRRIGATION SOLUTION
Status: AC
  Filled 2021-01-31: qty 500

## 2021-01-31 MED ORDER — ESMOLOL HCL 100 MG/10ML IV SOLN
INTRAVENOUS | Status: DC | PRN
  Administered 2021-01-31 (×4): 20 mg via INTRAVENOUS
  Administered 2021-01-31: 10 mg via INTRAVENOUS

## 2021-01-31 MED ORDER — SUCCINYLCHOLINE CHLORIDE 200 MG/10ML IV SOSY
PREFILLED_SYRINGE | INTRAVENOUS | Status: AC
  Filled 2021-01-31: qty 10

## 2021-01-31 MED ORDER — ONDANSETRON HCL 4 MG/2ML IJ SOLN
INTRAMUSCULAR | Status: DC | PRN
  Administered 2021-01-31: 4 mg via INTRAVENOUS

## 2021-01-31 MED ORDER — PROPOFOL 10 MG/ML IV BOLUS
INTRAVENOUS | Status: AC
  Filled 2021-01-31: qty 20

## 2021-01-31 MED ORDER — LIDOCAINE 2% (20 MG/ML) 5 ML SYRINGE
INTRAMUSCULAR | Status: DC | PRN
  Administered 2021-01-31: 60 mg via INTRAVENOUS

## 2021-01-31 MED ORDER — ONDANSETRON HCL 4 MG/2ML IJ SOLN
INTRAMUSCULAR | Status: AC
  Filled 2021-01-31: qty 2

## 2021-01-31 MED ORDER — ROCURONIUM BROMIDE 10 MG/ML (PF) SYRINGE
PREFILLED_SYRINGE | INTRAVENOUS | Status: DC | PRN
  Administered 2021-01-31: 50 mg via INTRAVENOUS
  Administered 2021-02-01: 20 mg via INTRAVENOUS

## 2021-01-31 MED ORDER — SODIUM CHLORIDE 0.9 % IV SOLN: INTRAVENOUS | Status: DC | PRN

## 2021-01-31 MED ORDER — FENTANYL CITRATE (PF) 250 MCG/5ML IJ SOLN
INTRAMUSCULAR | Status: DC | PRN
  Administered 2021-01-31: 100 ug via INTRAVENOUS
  Administered 2021-01-31 (×3): 50 ug via INTRAVENOUS

## 2021-01-31 MED ORDER — FENTANYL CITRATE (PF) 250 MCG/5ML IJ SOLN
INTRAMUSCULAR | Status: AC
  Filled 2021-01-31: qty 5

## 2021-01-31 MED ORDER — SUGAMMADEX SODIUM 200 MG/2ML IV SOLN
INTRAVENOUS | Status: DC | PRN
  Administered 2021-01-31: 200 mg via INTRAVENOUS

## 2021-01-31 MED ORDER — ARTIFICIAL TEARS OPHTHALMIC OINT
TOPICAL_OINTMENT | OPHTHALMIC | Status: DC | PRN
  Administered 2021-01-31: 1 via OPHTHALMIC

## 2021-01-31 MED ORDER — ESMOLOL HCL 100 MG/10ML IV SOLN
INTRAVENOUS | Status: AC
  Filled 2021-01-31: qty 10

## 2021-01-31 MED ORDER — SUCCINYLCHOLINE CHLORIDE 200 MG/10ML IV SOSY
PREFILLED_SYRINGE | INTRAVENOUS | Status: DC | PRN
  Administered 2021-01-31: 100 mg via INTRAVENOUS

## 2021-01-31 MED ORDER — PROPOFOL 10 MG/ML IV BOLUS
INTRAVENOUS | Status: DC | PRN
  Administered 2021-01-31: 150 mg via INTRAVENOUS

## 2021-01-31 MED ORDER — PHENYLEPHRINE HCL-NACL 20-0.9 MG/250ML-% IV SOLN
INTRAVENOUS | Status: DC | PRN
  Administered 2021-01-31: 40 ug/min via INTRAVENOUS

## 2021-01-31 MED ORDER — PROPOFOL 10 MG/ML IV BOLUS
INTRAVENOUS | Status: DC | PRN
  Administered 2021-01-31: 110 mg via INTRAVENOUS

## 2021-01-31 MED ORDER — CEFAZOLIN SODIUM-DEXTROSE 2-3 GM-%(50ML) IV SOLR
INTRAVENOUS | Status: DC | PRN
  Administered 2021-01-31: 2 g via INTRAVENOUS

## 2021-01-31 MED ORDER — IOHEXOL 350 MG/ML SOLN
80.0000 mL | Freq: Once | INTRAVENOUS | Status: AC | PRN
  Administered 2021-01-31: 80 mL via INTRAVENOUS

## 2021-01-31 MED ORDER — PROTAMINE SULFATE 10 MG/ML IV SOLN
INTRAVENOUS | Status: DC | PRN
  Administered 2021-01-31 (×2): 10 mg via INTRAVENOUS
  Administered 2021-01-31: 30 mg via INTRAVENOUS

## 2021-01-31 MED ORDER — SUCCINYLCHOLINE CHLORIDE 200 MG/10ML IV SOSY
PREFILLED_SYRINGE | INTRAVENOUS | Status: DC | PRN
  Administered 2021-01-31: 140 mg via INTRAVENOUS

## 2021-01-31 MED ORDER — LABETALOL HCL 5 MG/ML IV SOLN
INTRAVENOUS | Status: DC | PRN
  Administered 2021-01-31 (×2): 2.5 mg via INTRAVENOUS

## 2021-01-31 MED ORDER — ALBUMIN HUMAN 5 % IV SOLN: INTRAVENOUS | Status: DC | PRN

## 2021-01-31 MED ORDER — LIDOCAINE 2% (20 MG/ML) 5 ML SYRINGE
INTRAMUSCULAR | Status: AC
  Filled 2021-01-31: qty 5

## 2021-01-31 MED ORDER — IODIXANOL 320 MG/ML IV SOLN
INTRAVENOUS | Status: DC | PRN
  Administered 2021-01-31: 141.1 mL

## 2021-01-31 MED ORDER — LACTATED RINGERS IV SOLN: INTRAVENOUS | Status: DC | PRN

## 2021-01-31 MED ORDER — DEXAMETHASONE SODIUM PHOSPHATE 10 MG/ML IJ SOLN
INTRAMUSCULAR | Status: DC | PRN
  Administered 2021-01-31: 10 mg via INTRAVENOUS

## 2021-01-31 MED ORDER — FENTANYL CITRATE (PF) 100 MCG/2ML IJ SOLN
25.0000 ug | INTRAMUSCULAR | Status: AC | PRN
  Administered 2021-01-31: 50 ug via INTRAVENOUS
  Administered 2021-01-31: 100 ug via INTRAVENOUS
  Administered 2021-02-01 (×4): 50 ug via INTRAVENOUS

## 2021-01-31 MED ORDER — CEFAZOLIN SODIUM 1 G IJ SOLR
INTRAMUSCULAR | Status: AC
  Filled 2021-01-31: qty 20

## 2021-01-31 MED ORDER — 0.9 % SODIUM CHLORIDE (POUR BTL) OPTIME
TOPICAL | Status: DC | PRN
  Administered 2021-01-31: 1000 mL

## 2021-01-31 MED ORDER — FENTANYL CITRATE (PF) 100 MCG/2ML IJ SOLN
INTRAMUSCULAR | Status: AC
  Filled 2021-01-31: qty 2

## 2021-01-31 MED ORDER — ROCURONIUM BROMIDE 10 MG/ML (PF) SYRINGE
PREFILLED_SYRINGE | INTRAVENOUS | Status: AC
  Filled 2021-01-31: qty 10

## 2021-01-31 MED ORDER — PHENYLEPHRINE 40 MCG/ML (10ML) SYRINGE FOR IV PUSH (FOR BLOOD PRESSURE SUPPORT)
PREFILLED_SYRINGE | INTRAVENOUS | Status: DC | PRN
  Administered 2021-01-31: 40 ug via INTRAVENOUS

## 2021-01-31 MED ORDER — ROCURONIUM BROMIDE 10 MG/ML (PF) SYRINGE
PREFILLED_SYRINGE | INTRAVENOUS | Status: DC | PRN
  Administered 2021-01-31: 20 mg via INTRAVENOUS
  Administered 2021-01-31: 50 mg via INTRAVENOUS
  Administered 2021-01-31: 20 mg via INTRAVENOUS
  Administered 2021-01-31: 50 mg via INTRAVENOUS

## 2021-01-31 MED ORDER — DEXAMETHASONE SODIUM PHOSPHATE 10 MG/ML IJ SOLN
INTRAMUSCULAR | Status: AC
  Filled 2021-01-31: qty 1

## 2021-01-31 MED ORDER — ONDANSETRON HCL 4 MG/2ML IJ SOLN: 4.0000 mg | Freq: Four times a day (QID) | INTRAMUSCULAR | Status: DC | PRN

## 2021-01-31 MED ORDER — HEPARIN 6000 UNIT IRRIGATION SOLUTION
Status: DC | PRN
  Administered 2021-01-31: 1

## 2021-01-31 MED ORDER — HEPARIN SODIUM (PORCINE) 1000 UNIT/ML IJ SOLN
INTRAMUSCULAR | Status: DC | PRN
  Administered 2021-01-31 (×3): 2000 [IU] via INTRAVENOUS
  Administered 2021-01-31: 8000 [IU] via INTRAVENOUS

## 2021-01-31 MED ORDER — SODIUM CHLORIDE 0.9% IV SOLUTION: Freq: Once | INTRAVENOUS | Status: DC

## 2021-01-31 MED ORDER — PHENYLEPHRINE HCL-NACL 20-0.9 MG/250ML-% IV SOLN
INTRAVENOUS | Status: DC | PRN
  Administered 2021-01-31: 25 ug/min via INTRAVENOUS

## 2021-01-31 SURGICAL SUPPLY — 61 items
ADH SKN CLS APL DERMABOND .7 (GAUZE/BANDAGES/DRESSINGS) ×1
BAG COUNTER SPONGE SURGICOUNT (BAG) ×2 IMPLANT
BAG SPNG CNTER NS LX DISP (BAG) ×1
BANDAGE ESMARK 6X9 LF (GAUZE/BANDAGES/DRESSINGS) IMPLANT
BNDG CMPR 9X6 STRL LF SNTH (GAUZE/BANDAGES/DRESSINGS)
BNDG ELASTIC 4X5.8 VLCR STR LF (GAUZE/BANDAGES/DRESSINGS) IMPLANT
BNDG ESMARK 6X9 LF (GAUZE/BANDAGES/DRESSINGS)
CANISTER SUCT 3000ML PPV (MISCELLANEOUS) ×2 IMPLANT
CATH EMB 3FR 80CM (CATHETERS) ×1 IMPLANT
CATH EMB 4FR 80CM (CATHETERS) ×1 IMPLANT
CLIP TI MEDIUM 6 (CLIP) ×2 IMPLANT
CLIP TI WIDE RED SMALL 24 (CLIP) ×1 IMPLANT
CLIP VESOCCLUDE MED 24/CT (CLIP) ×2 IMPLANT
CLIP VESOCCLUDE SM WIDE 24/CT (CLIP) ×2 IMPLANT
CUFF TOURN SGL QUICK 24 (TOURNIQUET CUFF)
CUFF TOURN SGL QUICK 34 (TOURNIQUET CUFF)
CUFF TOURN SGL QUICK 42 (TOURNIQUET CUFF) IMPLANT
CUFF TRNQT CYL 24X4X16.5-23 (TOURNIQUET CUFF) IMPLANT
CUFF TRNQT CYL 34X4.125X (TOURNIQUET CUFF) IMPLANT
DERMABOND ADVANCED (GAUZE/BANDAGES/DRESSINGS) ×1
DERMABOND ADVANCED .7 DNX12 (GAUZE/BANDAGES/DRESSINGS) ×1 IMPLANT
DRAIN CHANNEL 15F RND FF W/TCR (WOUND CARE) IMPLANT
DRAPE X-RAY CASS 24X20 (DRAPES) IMPLANT
ELECT REM PT RETURN 9FT ADLT (ELECTROSURGICAL) ×2
ELECTRODE REM PT RTRN 9FT ADLT (ELECTROSURGICAL) ×1 IMPLANT
EVACUATOR SILICONE 100CC (DRAIN) IMPLANT
GAUZE 4X4 16PLY ~~LOC~~+RFID DBL (SPONGE) ×1 IMPLANT
GLOVE SURG POLYISO LF SZ7.5 (GLOVE) ×2 IMPLANT
GLOVE SURG UNDER POLY LF SZ7.5 (GLOVE) ×2 IMPLANT
GOWN STRL REUS W/ TWL LRG LVL3 (GOWN DISPOSABLE) ×2 IMPLANT
GOWN STRL REUS W/ TWL XL LVL3 (GOWN DISPOSABLE) ×1 IMPLANT
GOWN STRL REUS W/TWL LRG LVL3 (GOWN DISPOSABLE) ×4
GOWN STRL REUS W/TWL XL LVL3 (GOWN DISPOSABLE) ×2
HEMOSTAT SNOW SURGICEL 2X4 (HEMOSTASIS) IMPLANT
KIT BASIN OR (CUSTOM PROCEDURE TRAY) ×2 IMPLANT
KIT TURNOVER KIT B (KITS) ×2 IMPLANT
MARKER GRAFT CORONARY BYPASS (MISCELLANEOUS) IMPLANT
NS IRRIG 1000ML POUR BTL (IV SOLUTION) ×4 IMPLANT
PACK PERIPHERAL VASCULAR (CUSTOM PROCEDURE TRAY) ×2 IMPLANT
PAD ARMBOARD 7.5X6 YLW CONV (MISCELLANEOUS) ×4 IMPLANT
SET COLLECT BLD 21X3/4 12 (NEEDLE) IMPLANT
SPONGE T-LAP 18X18 ~~LOC~~+RFID (SPONGE) ×2 IMPLANT
STOPCOCK 4 WAY LG BORE MALE ST (IV SETS) IMPLANT
SUT ETHILON 3 0 PS 1 (SUTURE) IMPLANT
SUT PROLENE 5 0 C 1 24 (SUTURE) ×2 IMPLANT
SUT PROLENE 6 0 BV (SUTURE) ×10 IMPLANT
SUT PROLENE 7 0 BV 1 (SUTURE) IMPLANT
SUT SILK 2 0 SH (SUTURE) ×2 IMPLANT
SUT SILK 3 0 (SUTURE)
SUT SILK 3-0 18XBRD TIE 12 (SUTURE) IMPLANT
SUT VIC AB 2-0 CT1 27 (SUTURE) ×4
SUT VIC AB 2-0 CT1 TAPERPNT 27 (SUTURE) ×2 IMPLANT
SUT VIC AB 3-0 SH 27 (SUTURE) ×4
SUT VIC AB 3-0 SH 27X BRD (SUTURE) ×2 IMPLANT
SUT VICRYL 4-0 PS2 18IN ABS (SUTURE) ×4 IMPLANT
SYR 3ML LL SCALE MARK (SYRINGE) ×2 IMPLANT
TOWEL GREEN STERILE (TOWEL DISPOSABLE) ×2 IMPLANT
TRAY FOLEY MTR SLVR 16FR STAT (SET/KITS/TRAYS/PACK) ×2 IMPLANT
TUBING EXTENTION W/L.L. (IV SETS) IMPLANT
UNDERPAD 30X36 HEAVY ABSORB (UNDERPADS AND DIAPERS) ×2 IMPLANT
WATER STERILE IRR 1000ML POUR (IV SOLUTION) ×2 IMPLANT

## 2021-01-31 SURGICAL SUPPLY — 98 items
ADH SKN CLS APL DERMABOND .7 (GAUZE/BANDAGES/DRESSINGS) ×1
APL PRP STRL LF DISP 70% ISPRP (MISCELLANEOUS) ×1
BAG COUNTER SPONGE SURGICOUNT (BAG) ×2 IMPLANT
BAG SPNG CNTER NS LX DISP (BAG) ×1
BLADE CLIPPER SURG (BLADE) ×2 IMPLANT
CANISTER SUCT 3000ML PPV (MISCELLANEOUS) ×2 IMPLANT
CATH BEACON 5 .035 65 KMP TIP (CATHETERS) ×1 IMPLANT
CATH BEACON 5.038 65CM KMP-01 (CATHETERS) ×2 IMPLANT
CATH CROSS OVER TEMPO 5F (CATHETERS) ×1 IMPLANT
CATH OMNI FLUSH .035X70CM (CATHETERS) ×3 IMPLANT
CATH SOFT-VU 4F 65 STRAIGHT (CATHETERS) IMPLANT
CATH SOFT-VU STRAIGHT 4F 65CM (CATHETERS) ×2
CHLORAPREP W/TINT 26 (MISCELLANEOUS) ×2 IMPLANT
CLIP TI MEDIUM 24 (CLIP) ×1 IMPLANT
CLIP TI WIDE RED SMALL 24 (CLIP) ×1 IMPLANT
CLOSURE PERCLOSE PROSTYLE (VASCULAR PRODUCTS) ×8 IMPLANT
COIL NESTER 14X8 (Embolic) ×4 IMPLANT
DERMABOND ADVANCED (GAUZE/BANDAGES/DRESSINGS) ×1
DERMABOND ADVANCED .7 DNX12 (GAUZE/BANDAGES/DRESSINGS) ×1 IMPLANT
DEVICE CLOSURE PERCLS PRGLD 6F (VASCULAR PRODUCTS) IMPLANT
DEVICE TORQUE H2O (MISCELLANEOUS) ×1 IMPLANT
DEVICE TORQUE KENDALL .025-038 (MISCELLANEOUS) IMPLANT
DRSG TEGADERM 2-3/8X2-3/4 SM (GAUZE/BANDAGES/DRESSINGS) ×4 IMPLANT
DRYSEAL FLEXSHEATH 12FR 33CM (SHEATH) ×1
DRYSEAL FLEXSHEATH 18FR 33CM (SHEATH) ×1
ELECT REM PT RETURN 9FT ADLT (ELECTROSURGICAL) ×4
ELECTRODE REM PT RTRN 9FT ADLT (ELECTROSURGICAL) ×2 IMPLANT
EXCLDR TRNK ENDO 32X14.5X14 18 (Endovascular Graft) ×2 IMPLANT
EXCLUDER TNK END 32X14.5X14 18 (Endovascular Graft) IMPLANT
EXTENDER ENDOPROSTHESIS 10X7 (Endovascular Graft) ×1 IMPLANT
GAUZE 4X4 16PLY ~~LOC~~+RFID DBL (SPONGE) ×1 IMPLANT
GAUZE SPONGE 2X2 8PLY STRL LF (GAUZE/BANDAGES/DRESSINGS) ×2 IMPLANT
GLOVE SRG 8 PF TXTR STRL LF DI (GLOVE) IMPLANT
GLOVE SURG ENC MOIS LTX SZ7.5 (GLOVE) ×3 IMPLANT
GLOVE SURG POLYISO LF SZ6.5 (GLOVE) ×2 IMPLANT
GLOVE SURG POLYISO LF SZ8 (GLOVE) ×2 IMPLANT
GLOVE SURG SYN 8.0 (GLOVE) ×2 IMPLANT
GLOVE SURG SYN 8.0 PF PI (GLOVE) IMPLANT
GLOVE SURG UNDER POLY LF SZ6 (GLOVE) ×1 IMPLANT
GLOVE SURG UNDER POLY LF SZ6.5 (GLOVE) ×1 IMPLANT
GLOVE SURG UNDER POLY LF SZ8 (GLOVE) ×6
GOWN STRL REUS W/ TWL LRG LVL3 (GOWN DISPOSABLE) ×2 IMPLANT
GOWN STRL REUS W/ TWL XL LVL3 (GOWN DISPOSABLE) ×1 IMPLANT
GOWN STRL REUS W/TWL LRG LVL3 (GOWN DISPOSABLE) ×4
GOWN STRL REUS W/TWL XL LVL3 (GOWN DISPOSABLE) ×4
GRAFT BALLN CATH 65CM (STENTS) ×1 IMPLANT
GUIDEWIRE ANGLED .035X150CM (WIRE) ×1 IMPLANT
KIT BASIN OR (CUSTOM PROCEDURE TRAY) ×2 IMPLANT
KIT DRAIN CSF ACCUDRAIN (MISCELLANEOUS) IMPLANT
KIT ENCORE 26 ADVANTAGE (KITS) ×1 IMPLANT
KIT TURNOVER KIT B (KITS) ×2 IMPLANT
LEG CONTRALATERAL 16X12X10 (Vascular Products) ×4 IMPLANT
LEG CONTRALETERAL16X16X11.5 (Endovascular Graft) ×2 IMPLANT
LOOP VESSEL MAXI BLUE (MISCELLANEOUS) ×1 IMPLANT
LOOP VESSEL MINI RED (MISCELLANEOUS) ×1 IMPLANT
NS IRRIG 1000ML POUR BTL (IV SOLUTION) ×2 IMPLANT
PACK ENDOVASCULAR (PACKS) ×2 IMPLANT
PAD ARMBOARD 7.5X6 YLW CONV (MISCELLANEOUS) ×4 IMPLANT
PATCH VASC XENOSURE 1CMX6CM (Vascular Products) ×2 IMPLANT
PATCH VASC XENOSURE 1X6 (Vascular Products) IMPLANT
PERCLOSE PROGLIDE 6F (VASCULAR PRODUCTS)
SET MICROPUNCTURE 5F STIFF (MISCELLANEOUS) ×3 IMPLANT
SHEATH BRITE TIP 8FR 23CM (SHEATH) ×3 IMPLANT
SHEATH DRYSEAL FLEX 12FR 33CM (SHEATH) IMPLANT
SHEATH DRYSEAL FLEX 18FR 33CM (SHEATH) IMPLANT
SHEATH FAST CATH 10F 12CM (SHEATH) ×1 IMPLANT
SHEATH PINNACLE 8F 10CM (SHEATH) ×3 IMPLANT
SHEATH PINNACLE ST 6F 45CM (SHEATH) ×1 IMPLANT
SPONGE GAUZE 2X2 STER 10/PKG (GAUZE/BANDAGES/DRESSINGS) ×2
SPONGE T-LAP 18X18 ~~LOC~~+RFID (SPONGE) ×2 IMPLANT
STENT GRAFT BALLN CATH 65CM (STENTS) ×4
STENT GRAFT CONTRALAT 16X11.5 (Endovascular Graft) IMPLANT
STENT GRAFT CONTRALAT 16X12X10 (Vascular Products) IMPLANT
STOPCOCK MORSE 400PSI 3WAY (MISCELLANEOUS) ×3 IMPLANT
SUT MNCRL AB 4-0 PS2 18 (SUTURE) ×6 IMPLANT
SUT PROLENE 5 0 C 1 24 (SUTURE) ×8 IMPLANT
SUT PROLENE 6 0 BV (SUTURE) ×2 IMPLANT
SUT SILK 2 0 (SUTURE) ×2
SUT SILK 2-0 18XBRD TIE 12 (SUTURE) IMPLANT
SUT SILK 3 0 (SUTURE) ×2
SUT SILK 3-0 18XBRD TIE 12 (SUTURE) IMPLANT
SUT VIC AB 2-0 CT1 27 (SUTURE) ×4
SUT VIC AB 2-0 CT1 TAPERPNT 27 (SUTURE) IMPLANT
SUT VIC AB 3-0 SH 27 (SUTURE) ×4
SUT VIC AB 3-0 SH 27X BRD (SUTURE) IMPLANT
SUT VICRYL 4-0 PS2 18IN ABS (SUTURE) ×1 IMPLANT
SYR 20CC LL (SYRINGE) ×4 IMPLANT
SYR 20ML LL LF (SYRINGE) ×1 IMPLANT
SYR 30ML LL (SYRINGE) ×1 IMPLANT
SYR 30ML SLIP (SYRINGE) ×1 IMPLANT
TOWEL GREEN STERILE (TOWEL DISPOSABLE) ×2 IMPLANT
TRAY FOLEY MTR SLVR 16FR STAT (SET/KITS/TRAYS/PACK) ×2 IMPLANT
TUBING INJECTOR 48 (MISCELLANEOUS) ×3 IMPLANT
WIRE AMPLATZ SS-J .035X180CM (WIRE) ×6 IMPLANT
WIRE BENTSON .035X145CM (WIRE) ×5 IMPLANT
WIRE LUNDERQUIST .035X180CM (WIRE) ×2 IMPLANT
WIRE ROSEN 145CM (WIRE) ×1 IMPLANT
WIRE TORQFLEX AUST .018X40CM (WIRE) ×1 IMPLANT

## 2021-01-31 NOTE — Progress Notes (Signed)
Examined patient in PACU. No doppler flow in the left foot. Foot is numb and painful. Suspect thromboembolism to left leg.  Plan left leg thrombectomy emergently in OR. Family updated.  [name], MD [facility] Phone Number: [phone] 01/31/2021 11:06 PM

## 2021-01-31 NOTE — ED Provider Notes (Signed)
Emergency Medicine Provider Triage Evaluation Note  Lacey Vega , Vega 70 y.o. female  was evaluated in triage.  Pt complains of mid abd pain and back pain. Has had intermittent x years. Pain worse last night, currently improved. With only Vega "mild aching." No numbness or weakness. Outpatient imaging with aorta at 7.5 cm. Unfortunately PCP unable to get patient into vascular and sent here for further workup.    Review of Systems  Positive: Intermittent abd pain, back pain Negative: Cp, sob, numbness, weakness  Physical Exam  BP 123/68   Pulse (!) 110   Temp 99.1 F (37.3 C) (Oral)   Resp 16   SpO2 99%  Gen:   Awake, no distress   Resp:  Normal effort  MSK:   Moves extremities without difficulty  Other:    Medical Decision Making  Medically screening exam initiated at 11:38 AM.  Appropriate orders placed.  Lacey Vega was informed that the remainder of the evaluation will be completed by another provider, this initial triage assessment does not replace that evaluation, and the importance of remaining in the ED until their evaluation is complete.  Abdominal pain, back pain   Lacey [name], PA-C 01/31/21 1141    Carmin Muskrat, MD 01/31/21 1636

## 2021-01-31 NOTE — Transfer of Care (Signed)
Immediate Anesthesia Transfer of Care Note  Patient: Lacey Vega  Procedure(s) Performed: ENDOVASCULAR ANEURYSM REPAIR (EVAR)Bilateral Groin Cutdown, left femoral endaterectomy with bovine patch angioplasty. (Bilateral)  Patient Location: PACU  Anesthesia Type:General  Level of Consciousness: drowsy and responds to stimulation  Airway & Oxygen Therapy: Patient Spontanous Breathing and Patient connected to face mask oxygen  Post-op Assessment: Report given to RN and Post -op Vital signs reviewed and stable  Post vital signs: Reviewed and stable  Last Vitals:  Vitals Value Taken Time  BP 119/59 01/31/21 2219  Temp    Pulse 73 01/31/21 2231  Resp 15 01/31/21 2231  SpO2 100 % 01/31/21 2231  Vitals shown include unvalidated device data.  Last Pain:  Vitals:   01/31/21 1137  TempSrc:   PainSc: 0-No pain         Complications: No notable events documented.

## 2021-01-31 NOTE — Op Note (Signed)
DATE OF SERVICE: 01/31/2021  PATIENT:  Lacey Vega  70 y.o. female  PRE-OPERATIVE DIAGNOSIS:  large infrarenal aortic aneurysm with sac disruption  POST-OPERATIVE DIAGNOSIS:  Same  PROCEDURE:   1) bilateral US guided common femoral artery access 2) right hypogastric artery coil embolization (8x14cm x 4) 3) endovascular aortic aneurysm repair with extension on to bilateral external iliac arteries 4) bilateral open common femoral artery exposure 5) right common femoral endarterectomy and bovine pericardial patch angioplasty  SURGEON:  Surgeon(s) and Role:    * Cherre Robins, MD - Primary  ASSISTANT: Arlee Muslim, PA-C  An assistant was required to facilitate exposure and expedite the case.  ANESTHESIA:   general  EBL: 261m  BLOOD ADMINISTERED: 1 unit CC PRBC  DRAINS: none   LOCAL MEDICATIONS USED:  NONE  SPECIMEN:  none  COUNTS: confirmed correct.  TOURNIQUET:  none  PATIENT DISPOSITION:  PACU - hemodynamically stable.   Delay start of Pharmacological VTE agent (>24hrs) due to surgical blood loss or risk of bleeding: no  ENDOPROSTHESES: Via R CFA 32 x 14 x 140 mm conformable excluder main body 12 x 1060mright iliac extension graft  Via L CFA Nestor coils to right hypogastric artery (8x14cm) x 4 16 x 11518meft iliac extension device 10 x 71m6mft iliac extension device  INDICATION FOR PROCEDURE: Lacey Vega 69 y18. female with large aneurysm with aneurysm sac irregularity concerning for impending rupture. After careful discussion of risks, benefits, and alternatives the patient was offered EVAR. The patient understood and wished to proceed.  OPERATIVE FINDINGS: perclose failure in both groins. Right hypogastric artery coil embolized with plans to extend repair on to right external iliac artery.  Unremarkable EVAR.  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  Repair extended into the left external iliac  artery.  Completion angiography showed no evidence of type I or III endoleak.  Questionable type II endoleak seen.  Bilateral groin cutdown.  Distal Doppler evaluation revealed asymmetric exam with brisk Doppler flow in the right foot and diminished flow in the left foot.  A pulse deficit was noted across the repair.  I extended the arteriotomy and performed endarterectomy with visibly disrupted plaque in the lumen.  Good pulse restored in the common femoral artery into the profunda femoris artery and proximal superficial femoral artery.  No change in the Doppler flow exam at the feet.  I elected to end the case here.  DESCRIPTION OF PROCEDURE: After identification of the patient in the pre-operative holding area, the patient was transferred to the operating room. The patient was positioned supine on the operating room table. Anesthesia was induced. The abdomen, groins, and thighs were prepped and draped in standard fashion. A surgical pause was performed confirming correct patient, procedure, and operative location.  Using ultrasound guidance, the bilateral common femoral arteries were accessed with micropuncture technique.  Access was upsized to 4 Fren[place]rocatheter.  A Bentson wire advanced into the aorta.  The tract was upsized to 6 Fren[place]ann the left a Perclose device was advanced without difficulty.  3 Perclose devices maldeployed.  I inserted an 8 French sheath and turned my attention to the right femoral access.  I could not advance a Bentson wire into the aorta despite fluoroscopic guidance.  I elected to perform cutdowns at the end of the case.  When external rotation to the right hypogastric artery.  A 035 Bentson wire was advanced into the aortic aneurysm  from the left common femoral artery.  Through a series of wire and catheter exchanges I ultimately was able to engage the orifice of the right hypogastric artery with a rim catheter and a Glidewire.  The wire was advanced deep into the pelvis.   Over the wire a 4 French straight catheter was advanced into the hypogastric artery.  Coil embolization was performed of the hypogastric artery using Nester coils.  Completion angiography showed no antegrade flow through the hypogastric artery.  Glidewire was advanced into the descending thoracic aorta.  Over the wires Kumpe catheters were advanced.  Amplatz wires were advanced into the descending thoracic aorta.  Our sheath access was upsized.  On the right we used an 32 French dry seal.  On the left using 4 French dry seal.  Both devices were delivered into the terminal aorta.  A 32 x 14 x 140 mm Gore conformable excluder endoprosthesis was delivered via right common femoral artery access into the neck of the aortic aneurysm.  For the left common femoral artery access a pigtail catheter was advanced over the level of the renals.  Angiogram was performed.  We marked the takeoff of the renal vessels.  The endoprosthesis was partially deployed.  Using buddy wire technique we cannulated the gate.  An Amplatz wire was advanced through the pigtail catheter and left in position to hold the sheath aiming towards the gate.  A Glidewire was advanced easily through the deep without difficulty.  Over the Glidewire the pigtail catheter was advanced into the main body of the endoprosthesis.  The wire was withdrawn and the catheter spun freely in the main body.  Satisfied we were in the endoprosthesis we removed the Amplatz wire and advanced it through the pigtail into the descending thoracic aorta.  The pigtail was brought down into the pelvis and used to measure a retrograde angiogram for left iliac extension.  A 16 x 115 mm right iliac extension graft was delivered into the common iliac artery.  Great care was taken to land as close as possible to the hypogastric artery for maximum seal.  We completed deployment of the main body.  Over the Amplatz wire we navigated a pigtail catheter into the right limb of the  endoprosthesis.  Retrograde angiography was performed.  A 12 x 100 mm right iliac extension graft was delivered from the endoprosthesis to the right external iliac artery.  A mild balloon was used to angioplasty the proximal and distal seal points.  Areas of overlap were angioplastied as well.  Completion angiogram was performed.  This revealed a significant type Ib endoleak from the left side.  I elected to extend our repair onto the external iliac artery.  Over an Amplatz wire, a 10 x 70 mm left iliac extension device was deployed from the existing endoprosthesis to the external iliac artery.  The overlap and distal seal points were angioplastied with a mild balloon again.  Another completion angiography revealed no evidence of Ib endoleak.  There was no evidence of 1A or 3 endoleak.  There may be a type II endoleak, but this is not convincingly demonstrated.  Oblique incisions were made around the sheaths.  Incisions were carried down until the femoral artery was identified and skeletonized.  Clamps were applied proximally distally to the femoral arteries.  Both the arteriotomy was repaired with simple Prolene suture with 5-0 Prolene.  Doppler flow was tested and on the feet.  Good flow was noted in the right foot.  Asymmetric,  diminished flow was noted in the left foot.  I interrogated the repair in the groin.  There was a clear pulse deficit across it.  Clamps were reapplied to the left external iliac, profunda femoris, and superficial femoral artery.  A longitudinal arteriotomy was made with an 11 blade over the previous repair and was extended with Potts scissors. Exploration of the left common femoral artery identified disrupted plaque in the lumen. Endarterectomy was performed using standard technique. Torrential inflow was achieved. The lumen of the profunda was widely patent and backbleed freely. The superficial femoral artery was patent to the distal aspect of the incision. The superficial femoral  artery back bleed, but less briskly. A bovine pericardial patch was used to repair the arteriotomy. A running continuous suture of 5-O prolene was used to sew the patch to the arteriotomy. Immediately prior to completion the repair was flushed and de-aired.  The repair was completed. A good pulse was noted across the repair. Doppler flow at completion was unchanged. I elected to end the case here and allow the patient to warm.   Heparin was reversed with protamine. The groins were closed in layers using 2-O vicryl, 3-O vicryl and 4-O monocryl. Dermabond was applied.   Upon completion of the case instrument and sharps counts were confirmed correct. The patient was transferred to the PACU in good condition. I was present for all portions of the procedure.  [name], MD [facility] Phone Number: [phone] 01/31/2021 10:11 PM

## 2021-01-31 NOTE — Anesthesia Procedure Notes (Signed)
Procedure Name: Intubation Date/Time: 01/31/2021 5:16 PM Performed by: Reece Agar, CRNA Pre-anesthesia Checklist: Patient identified, Emergency Drugs available, Suction available and Patient being monitored Patient Re-evaluated:Patient Re-evaluated prior to induction Oxygen Delivery Method: Circle system utilized Preoxygenation: Pre-oxygenation with 100% oxygen Induction Type: IV induction, Rapid sequence and Cricoid Pressure applied Ventilation: Unable to mask ventilate Laryngoscope Size: Mac, Glidescope and 3 Grade View: Grade I Tube type: Oral Tube size: 7.0 mm Number of attempts: 1 Airway Equipment and Method: Video-laryngoscopy and Rigid stylet Placement Confirmation: ETT inserted through vocal cords under direct vision, positive ETCO2 and breath sounds checked- equal and bilateral Secured at: 21 cm Tube secured with: Tape Dental Injury: Teeth and Oropharynx as per pre-operative assessment  Comments: RSI due to COVID + status.

## 2021-01-31 NOTE — Anesthesia Preprocedure Evaluation (Signed)
Anesthesia Evaluation  Patient identified by MRN, date of birth, ID band Patient awake    Reviewed: Allergy & Precautions, H&P , NPO status , Patient's Chart, lab work & pertinent test results  Airway Mallampati: II   Neck ROM: full    Dental   Pulmonary shortness of breath, asthma , COPD, former smoker,  COVID+   breath sounds clear to auscultation       Cardiovascular hypertension,  Rhythm:regular Rate:Normal     Neuro/Psych  Headaches, PSYCHIATRIC DISORDERS Anxiety    GI/Hepatic GERD  ,  Endo/Other  diabetes, Type 2Hypothyroidism   Renal/GU      Musculoskeletal  (+) Arthritis ,   Abdominal   Peds  Hematology  (+) Blood dyscrasia, Sickle cell anemia and anemia ,   Anesthesia Other Findings   Reproductive/Obstetrics                             Anesthesia Physical Anesthesia Plan  ASA: 3 and emergent  Anesthesia Plan: General   Post-op Pain Management:    Induction: Intravenous  PONV Risk Score and Plan: 3 and Ondansetron, Dexamethasone, Midazolam and Treatment may vary due to age or medical condition  Airway Management Planned: Oral ETT  Additional Equipment: Arterial line  Intra-op Plan:   Post-operative Plan: Extubation in OR  Informed Consent: I have reviewed the patients History and Physical, chart, labs and discussed the procedure including the risks, benefits and alternatives for the proposed anesthesia with the patient or authorized representative who has indicated his/her understanding and acceptance.     Dental advisory given  Plan Discussed with: CRNA, Anesthesiologist and Surgeon  Anesthesia Plan Comments:         Anesthesia Quick Evaluation

## 2021-01-31 NOTE — ED Notes (Signed)
Vascular at bedside, consent for surgery signed.

## 2021-01-31 NOTE — Anesthesia Procedure Notes (Signed)
Central Venous Catheter Insertion Performed by: Albertha Ghee, MD, anesthesiologist Start/End9/06/2020 5:18 PM, 01/31/2021 5:25 PM Patient location: Pre-op. Preanesthetic checklist: patient identified, IV checked, site marked, risks and benefits discussed, surgical consent, monitors and equipment checked, pre-op evaluation, timeout performed and anesthesia consent Position: Trendelenburg Lidocaine 1% used for infiltration and patient sedated Hand hygiene performed , maximum sterile barriers used  and Seldinger technique used Catheter size: 7 Fr Central line was placed.Double lumen Procedure performed using ultrasound guided technique. Ultrasound Notes:anatomy identified, needle tip was noted to be adjacent to the nerve/plexus identified, no ultrasound evidence of intravascular and/or intraneural injection and image(s) printed for medical record Attempts: 1 Following insertion, line sutured, dressing applied and Biopatch. Post procedure assessment: blood return through all ports, free fluid flow and no air  Patient tolerated the procedure well with no immediate complications.

## 2021-01-31 NOTE — Anesthesia Procedure Notes (Signed)
Arterial Line Insertion Start/End9/06/2020 5:15 PM, 01/31/2021 5:20 PM Performed by: Dorthea Cove, CRNA, CRNA  Patient location: Pre-op. Preanesthetic checklist: patient identified, IV checked, site marked, risks and benefits discussed, surgical consent, monitors and equipment checked, pre-op evaluation, timeout performed and anesthesia consent Lidocaine 1% used for infiltration Left, radial was placed Catheter size: 20 G Hand hygiene performed  and maximum sterile barriers used   Attempts: 1 Procedure performed without using ultrasound guided technique. Following insertion, dressing applied and Biopatch. Post procedure assessment: normal and unchanged  Patient tolerated the procedure well with no immediate complications.

## 2021-01-31 NOTE — H&P (Signed)
[facility] [place]  ASSESSMENT / PLAN: 70 y.o. female with large (51m) infra-renal abdominal aortic aneurysm with anterior sac irregularity.  Both I and the attending radiologist feel the irregularity on the sac is worrisome.  We will plan to proceed with urgent vascular repair tonight.  I counseled the patient extensively on risks, benefits, and alternatives to intervention.  She is understanding and wishes to proceed.  CHIEF COMPLAINT: Large aneurysm  HISTORY OF PRESENT ILLNESS: WBreyah Sandfordis a 70y.o. female referred to the ER by her primary care physician after outpatient duplex ultrasound revealed large infrarenal abdominal aortic aneurysm.  [facility] demonstrated a 75 mm infrarenal abdominal aortic aneurysm with anterior sac irregularity.  The patient reports no symptoms referable to her aneurysm.  She has no abdominal pain.  No back pain.  She has had intermittent abdominal and back pain over the past several weeks.  Incidentally, she is recovering from a COVID infection several weeks ago.  She is still COVID-positive in our ER today.  Past Medical History:  Diagnosis Date   Anemia    Anxiety    Asthma    Asthma    Bronchitis    hx   Colon polyps    hyperplastic   [place]    mild   Diabetes mellitus    DJD (degenerative joint disease), cervical    Dysphagia    history of   Dyspnea    Gallstones    GERD (gastroesophageal reflux disease)    H/O chest pain    secondary to anemia   Headache    History of arteriovenous malformation (AVM)    History of hiatal hernia    History of tobacco use    Hyperlipemia    Hypertension    Hypothyroidism    Iron deficiency anemia    [place]ickle cell [place]    "patient is not aware of this"   Thyroid disease    TIA (transient ischemic attack)    "was told that she possible had one"    Past Surgical History:  Procedure Laterality  Date   ABDOMINAL HYSTERECTOMY     ANTERIOR CERVICAL DECOMP/DISCECTOMY FUSION N/A 10/03/2016   Procedure: ANTERIOR CERVICAL DECOMPRESSION/DISCECTOMY FUSION CERVICAL THREE- CERVICAL FOUR;  Surgeon: CAshok Pall MD;  [facility]  Service: Neurosurgery;  Laterality: N/A;  Right side approach   BOTOX INJECTION  08/29/2015   Procedure: BOTOX INJECTION;  Surgeon: VWilford Corner MD;  [place]  Service: Endoscopy;;   CHOLECYSTECTOMY     COLONOSCOPY  12/03/2011   Procedure: COLONOSCOPY;  Surgeon: VLear Ng MD;  [facility];  Service: Endoscopy;  Laterality: N/A;   ESOPHAGEAL MANOMETRY N/A 07/13/2017   Procedure: ESOPHAGEAL MANOMETRY (EM);  Surgeon: SWilford Corner MD;  [facility];  Service: Endoscopy;  Laterality: N/A;   ESOPHAGOGASTRODUODENOSCOPY  12/03/2011   Procedure: ESOPHAGOGASTRODUODENOSCOPY (EGD);  Surgeon: VLear Ng MD;  [facility];  Service: Endoscopy;  Laterality: N/A;   ESOPHAGOGASTRODUODENOSCOPY (EGD) WITH PROPOFOL N/A 08/29/2015   Procedure: ESOPHAGOGASTRODUODENOSCOPY (EGD) WITH PROPOFOL;  Surgeon: VWilford Corner MD;  [facility]  Service: Endoscopy;  Laterality: N/A;   ESOPHAGOGASTRODUODENOSCOPY (EGD) WITH PROPOFOL N/A 05/17/2019   Procedure: ESOPHAGOGASTRODUODENOSCOPY (EGD) WITH PROPOFOL;  Surgeon: SWilford Corner MD;  [facility];  Service: Endoscopy;  Laterality: N/A;   GIVENS CAPSULE STUDY N/A 08/05/2018   Procedure: GIVENS CAPSULE STUDY;  Surgeon: SWilford Corner MD;  [facility];  Service: Endoscopy;  Laterality: N/A;   HOT HEMOSTASIS  12/03/2011   Procedure: HOT HEMOSTASIS (ARGON PLASMA COAGULATION/BICAP);  Surgeon: [name], MD;  [facility];  Service: Endoscopy;  Laterality: N/A;   NECK SURGERY     ROTATOR CUFF REPAIR     right   ROTATOR CUFF REPAIR Right 2014   TONSILLECTOMY      Family History  Problem Relation Age of Onset   Diabetes  Mother    Heart attack Brother    Cancer Maternal Aunt     Social History   Socioeconomic History   Marital status: Divorced    Spouse name: Not on file   Number of children: 2   Years of education: Not on file   Highest education level: Not on file  Occupational History   Occupation: group leader environmental services    Employer: [employer]    Comment: [employer]  Tobacco Use   Smoking status: Former    Types: Cigarettes    Quit date: 06/02/2000    Years since quitting: 20.6   Smokeless tobacco: Never  Vaping Use   Vaping Use: Never used  Substance and Sexual Activity   Alcohol use: No   Drug use: No   Sexual activity: Not on file  Other Topics Concern   Not on file  Social History Narrative   ** Merged History Encounter **       Social Determinants of Health   Financial Resource Strain: Not on file  Food Insecurity: Not on file  Transportation Needs: Not on file  Physical Activity: Not on file  Stress: Not on file  Social Connections: Not on file  Intimate Partner Violence: Not on file    Allergies  Allergen Reactions   Oxycodone Shortness Of Breath   Oxycodone     No current facility-administered medications for this encounter.   Current Outpatient Medications  Medication Sig Dispense Refill   albuterol (VENTOLIN HFA) 108 (90 Base) MCG/ACT inhaler Inhale 2 puffs into the lungs every 6 (six) hours as needed for wheezing or shortness of breath.      aspirin EC 81 MG tablet Take 81 mg by mouth daily.     atorvastatin (LIPITOR) 40 MG tablet Take 40 mg by mouth daily.     carisoprodol (SOMA) 250 MG tablet Take 250 mg by mouth 2 (two) times daily as needed (cramps).      CINNAMON PO Take 1 tablet by mouth daily. '1000mg'$      docusate sodium (COLACE) 100 MG capsule Take 100 mg by mouth daily.     donepezil (ARICEPT) 5 MG tablet Take 5 mg by mouth at bedtime.     ferrous sulfate ([employer] IRON) 325 (65 FE) MG tablet TAKE 1 TABLET EVERY DAY . PLEASE  TAKE 1 PILL DAILY WITH ORANGE JUICE OR 500 UNITS OF VITAMIN C (Patient taking differently: Take 325 mg by mouth daily with breakfast.) 90 tablet 1   furosemide (LASIX) 20 MG tablet Take 20 mg by mouth daily.      levothyroxine (SYNTHROID) 125 MCG tablet Take 125 mcg by mouth daily before breakfast.     Magnesium Oxide 420 (252 Mg) MG TABS Take 420 mg by mouth daily.     montelukast (SINGULAIR) 10 MG tablet Take 10 mg by mouth daily.       Multiple Vitamins-Minerals (HAIR/SKIN/NAILS/BIOTIN PO) Take 1 tablet by mouth daily.     nitroGLYCERIN (NITROSTAT) 0.4 MG SL  tablet Place 0.4 mg under the tongue every 5 (five) minutes as needed for chest pain.     Omega-3 Fatty Acids (FISH OIL BURP-LESS) 1200 MG CAPS Take 2 capsules by mouth daily.     omeprazole (PRILOSEC) 40 MG capsule Take 40 mg by mouth daily.     Cyanocobalamin (VITAMIN B 12) 500 MCG TABS Take 1,000 mg by mouth every morning.     Facility-Administered Medications Ordered in Other Encounters  Medication Dose Route Frequency Provider Last Rate Last Admin   0.9 %  sodium chloride infusion   Intravenous Continuous Wilford Corner, MD        REVIEW OF SYSTEMS:  '[X]'$  denotes positive finding, '[ ]'$  denotes negative finding Cardiac  Comments:  Chest pain or chest pressure:    Shortness of breath upon exertion:    Short of breath when lying flat:    Irregular heart rhythm:        Vascular    Pain in calf, thigh, or hip brought on by ambulation:    Pain in feet at night that wakes you up from your sleep:     Blood clot in your veins:    Leg swelling:         Pulmonary    Oxygen at home:    Productive cough:     Wheezing:         Neurologic    Sudden weakness in arms or legs:     Sudden numbness in arms or legs:     Sudden onset of difficulty speaking or slurred speech:    Temporary loss of vision in one eye:     Problems with dizziness:         Gastrointestinal    Blood in stool:     Vomited blood:         Genitourinary     Burning when urinating:     Blood in urine:        Psychiatric    Major depression:         Hematologic    Bleeding problems:    Problems with blood clotting too easily:        Skin    Rashes or ulcers:        Constitutional    Fever or chills:      PHYSICAL EXAM Vitals:   01/31/21 1545 01/31/21 1600 01/31/21 1615 01/31/21 1630  BP: 109/60 120/64 117/69 137/87  Pulse: 79 79 83 71  Resp: '19 15 16 20  '$ Temp:      TempSrc:      SpO2: 100% 100% 100% 99%    Constitutional: well appearing. no distress. Appears well nourished.  Neurologic: CN intact. no focal findings. no sensory loss. Psychiatric:  Mood and affect symmetric and appropriate. Eyes:  No icterus. No conjunctival pallor. Ears, nose, throat:  mucous membranes moist. Midline trachea.  Cardiac: regular rate and rhythm.  Respiratory:  unlabored. Abdominal:  soft, non-tender, non-distended.  Peripheral vascular: 2+ femoral pulses Extremity: no edema. no cyanosis. no pallor.  Skin: no gangrene. no ulceration.  Lymphatic: no Stemmer's sign. no palpable lymphadenopathy.  PERTINENT LABORATORY AND RADIOLOGIC DATA  Most recent CBC CBC Latest Ref Rng & Units 01/31/2021 01/31/2021 11/05/2020  WBC 4.0 - 10.5 K/uL - 6.3 6.2  Hemoglobin 12.0 - 15.0 g/dL 10.5(L) 9.6(L) 9.6(L)  Hematocrit 36.0 - 46.0 % 31.0(L) 32.1(L) 31.1(L)  Platelets 150 - 400 K/uL - 335 308     Most recent CMP CMP Latest  Ref Rng & Units 01/31/2021 01/31/2021 11/05/2020  Glucose 70 - 99 mg/dL 114(H) 113(H) 113(H)  BUN 8 - 23 mg/dL '15 14 15  '$ Creatinine 0.44 - 1.00 mg/dL 1.10(H) 1.11(H) 1.00  Sodium 135 - 145 mmol/L 139 137 144  Potassium 3.5 - 5.1 mmol/L 4.1 4.1 4.0  Chloride 98 - 111 mmol/L 106 103 108  CO2 22 - 32 mmol/L - 23 25  Calcium 8.9 - 10.3 mg/dL - 9.2 9.2  Total Protein 6.5 - 8.1 g/dL - 7.3 7.3  Total Bilirubin 0.3 - 1.2 mg/dL - 0.9 0.5  Alkaline Phos 38 - 126 U/L - 117 112  AST 15 - 41 U/L - 17 13(L)  ALT 0 - 44 U/L - 11 <6    Renal  function CrCl cannot be calculated (Unknown ideal weight.).  Hgb A1c MFr Bld (%)  Date Value  09/25/2016 6.2 (H)    LDL Cholesterol  Date Value Ref Range Status  09/14/2009 (H) 0 - 99 mg/dL Final   126        Total Cholesterol/HDL:CHD Risk Coronary Heart Disease Risk Table                     Men   Women  1/2 Average Risk   3.4   3.3  Average Risk       5.0   4.4  2 X Average Risk   9.6   7.1  3 X Average Risk  23.4   11.0        Use the calculated Patient Ratio above and the CHD Risk Table to determine the patient's CHD Risk.        ATP III CLASSIFICATION (LDL):  <100     mg/dL   Optimal  100-129  mg/dL   Near or Above                    Optimal  130-159  mg/dL   Borderline  160-189  mg/dL   High  >190     mg/dL   Very High    CLINICAL DATA:  Recent diagnosis large abdominal aortic aneurysm measuring up to approximately 7.5 cm in maximum diameter by ultrasound. Intermittent mid abdominal and back pain.   EXAM: CT ANGIOGRAPHY CHEST, ABDOMEN AND PELVIS   TECHNIQUE: Non-contrast CT of the chest was initially obtained.   Multidetector CT imaging through the chest, abdomen and pelvis was performed using the standard protocol during bolus administration of intravenous contrast. Multiplanar reconstructed images and MIPs were obtained and reviewed to evaluate the vascular anatomy.   CONTRAST:  33m OMNIPAQUE IOHEXOL 350 MG/ML SOLN   COMPARISON:  Aortic ultrasound on 01/29/2021   FINDINGS: CTA CHEST FINDINGS   Cardiovascular: Atherosclerosis of the thoracic aorta without evidence of aneurysm or dissection. Visualized proximal great vessels demonstrate normal patency and branching anatomy. Dilated central pulmonary arteries with the main pulmonary artery measuring up to 3.3 cm. Normal heart size. Calcified coronary artery plaque present. No pericardial fluid identified.   Mediastinum/Nodes: No enlarged mediastinal, hilar, or axillary lymph nodes. Thyroid gland,  trachea, and esophagus demonstrate no significant findings.   Lungs/Pleura: Emphysematous lung disease. Vague focal ground-glass opacity in the lateral aspect of the right lower lobe measures approximately 7 x 10 mm image 84/8. Triangular shaped nodule associated with the left major fissure measures approximately 3 x 6 mm on image 88/8. No edema, pneumothorax or pleural fluid.   Musculoskeletal: No chest wall abnormality. No acute or significant  osseous findings.   Review of the MIP images confirms the above findings.   CTA ABDOMEN AND PELVIS FINDINGS   VASCULAR   Aorta: Fusiform infrarenal abdominal aortic aneurysm reaches maximum transverse dimensions of approximately 6.9 x 6.9 cm with maximum oblique diameter of approximately 7.1 cm. Infrarenal aneurysm neck measures approximately 2.5-3.1 cm and widens towards the aneurysm. The aneurysm terminates at the aortic bifurcation. There is no evidence aortic dissection or acute hemorrhage. There is slight irregular configuration the left anterior margin of the aneurysm focally on image 179/7 at roughly the 1 o'clock position training a slight nipple configuration at the level of mural thrombus. This can be indicative of an area of adventitial weakening and in the setting acute or intermittent abdominal pain, impending rupture or high risk of rupture could be present.   Celiac: Irregular plaque at the origin of the celiac axis causes approximately 75-80% stenosis.   SMA: Mild plaque at the origin of the SMA causes approximately 30-40% stenosis.   Renals: Atherosclerosis of bilateral single renal arteries without evidence of significant renal artery stenosis. Both renal arteries originate at the same level off of the abdominal aorta.   IMA: The IMA origin is occluded by mural thrombus in the aortic aneurysm.   Inflow: Calcified and tortuous common iliac arteries with mild aneurysmal disease. Maximum caliber of the right common  iliac artery is approximately 15 mm and left common iliac artery is approximately 14 mm. Bilateral external iliac arteries are diffusely diseased with calcified plaque and internal iliac arteries also demonstrate diffuse calcified plaque. No significant obstructive disease of the iliac arteries is identified. Bilateral common femoral arteries and femoral bifurcations are normally patent.   Review of the MIP images confirms the above findings.   NON-VASCULAR   Hepatobiliary: No focal liver abnormality is seen. Status post cholecystectomy. No biliary dilatation.   Pancreas: Unremarkable. No pancreatic ductal dilatation or surrounding inflammatory changes.   Spleen: Normal in size without focal abnormality.   Adrenals/Urinary Tract: Adrenal glands are unremarkable. Kidneys are normal, without renal calculi, focal lesion, or hydronephrosis. Bladder is unremarkable.   Stomach/Bowel: Bowel shows no evidence of obstruction, ileus, inflammation or lesion. The appendix is normal. Mild diverticulosis of the sigmoid colon.   Lymphatic: No enlarged lymph nodes identified. There are some scattered small mesenteric lymph nodes present as well as small bilateral inguinal lymph nodes.   Reproductive: Status post hysterectomy. No adnexal masses.   Other: Nonspecific mild and vague stranding/edema in the central mesenteric fat may be chronic and does not have the appearance acute hemorrhage. No abnormal fluid collections or ascites identified. No evidence of hernia.   Musculoskeletal: No acute or significant osseous findings.   Review of the MIP images confirms the above findings.   IMPRESSION: 1. Pulmonary emphysema with associated dilated central pulmonary arteries consistent with component of pulmonary hypertension. 2. 7 x 10 mm ground-glass opacity in the right lower lobe and small nodule associated with the left major fissure. Initial follow-up with CT at 6-12 months is recommended  to confirm persistence. If persistent, repeat CT is recommended every 2 years until 5 years of stability has been established. This recommendation follows the consensus statement: Guidelines for Management of Incidental Pulmonary Nodules Detected on CT Images: From the Fleischner Society 2017; Radiology 2017; 284:228-243. 3. 6.9 x 6.9 cm fusiform infrarenal abdominal aortic aneurysm with maximal oblique diameter of 7.1 cm. There is no evidence of overt aneurysm rupture. However, there is a focal subtle nipple configuration along the  left anterior aneurysm wall at the 1 o'clock position back could represent an area of focal adventitial weakness and implicate impending rupture if the patient is symptomatic. 4. 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 5. Single bilateral renal arteries with atherosclerosis but no high-grade stenosis. 6. 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 7. Nonspecific mild stranding/edema in the central mesenteric fat with associated small mesenteric lymph nodes. 8. These results were called by telephone at the time of interpretation on 01/31/2021 at 3:40 PM to provider [facility], PA-C, who verbally acknowledged these results.     Electronically Signed   By: Aletta Edouard M.D.   On: 01/31/2021 15:46    [name], MD [facility] Phone Number: [phone] 01/31/2021 4:39 PM  Total time spent on preparing this encounter including chart review, data review, collecting history, examining the patient, coordinating care for this new patient, 60 minutes.  Portions of this report may have been transcribed using voice recognition software.  Every effort has been made to ensure accuracy; however, inadvertent computerized transcription errors may still be present.

## 2021-01-31 NOTE — ED Triage Notes (Signed)
Patient states she was sent to ED by PCP after receiving the results of a recent aortic ultrasound showed a 7.5 cm aortic aneurysm. Patient denies any pain, denies complaints at this time.

## 2021-01-31 NOTE — ED Provider Notes (Signed)
[facility] EMERGENCY DEPARTMENT Provider Note   CSN: [id number] Arrival date & time: 01/31/21  1129     History CC: Abdominal ultrasound   Lacey Vega is a 70 y.o. female presenting to emergency department concern for AAA.  Patient an ultrasound aortogram performed on August 30, 2 days ago, which was notable for a distal abdominal aortic aneurysm measuring 7.5 cm.  Her PCP advised to come to the emergency department because unable to arrange for expedited vascular surgery appointment.  She denies any abdominal pain at this time.  She denies syncope or near syncope.  She reports a former history of smoking but quit about 20 years ago.  She does report a distant history of knowing she had an aneurysm, but is not sure the last time it was imaged or how much larger it is gone.  Per medical records review, the patient last had an ultrasound of the abdomen performed on January 2011, 10 years ago.  She had a fusiform aneurysm measuring 4.5 cm in maximal diameter at the time.  Patient's Lacey Vega at bedside, reports Lacey Vega had Covid 2 months ago and patient had congestion at the time but did not test for it.  Patient has not had covid vaccines.  HPI     Past Medical History:  Diagnosis Date   Anemia    Anxiety    Asthma    Asthma    Bronchitis    hx   Colon polyps    hyperplastic   Diabetes ([place])    mild   Diabetes mellitus    DJD (degenerative joint disease), cervical    Dysphagia    history of   Dyspnea    Gallstones    GERD (gastroesophageal reflux disease)    H/O chest pain    secondary to anemia   Headache    History of arteriovenous malformation (AVM)    History of hiatal hernia    History of tobacco use    Hyperlipemia    Hypertension    Hypothyroidism    Iron deficiency anemia    Sickle cell anemia ([place])    "patient is not aware of this"   Thyroid disease    TIA (transient ischemic attack)    "was told that she possible had one"     Patient Active Problem List   Diagnosis Date Noted   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   DJD (degenerative joint disease), cervical    Iron deficiency anemia    History of arteriovenous malformation (AVM)    History of tobacco use    Asthma    H/O chest pain    Diabetes ([place])    Dysphagia    Iron deficiency anemia, unspecified 12/03/2011   Hypothyroidism (acquired) 10/07/2010    Class: Chronic   DYSPHAGIA UNSPECIFIED 10/05/2009   ANEMIA 08/20/2009   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   DM 04/17/2009   Asthma with COPD ([place]) 07/05/1990    Class: Chronic    Past Surgical History:  Procedure Laterality Date   ABDOMINAL HYSTERECTOMY     ANTERIOR CERVICAL DECOMP/DISCECTOMY FUSION N/A 10/03/2016   Procedure: ANTERIOR CERVICAL DECOMPRESSION/DISCECTOMY FUSION CERVICAL THREE- CERVICAL FOUR;  Surgeon: Ashok Pall, MD;  Location: [place];  Service: Neurosurgery;  Laterality: N/A;  Right side approach   BOTOX INJECTION  08/29/2015   Procedure: BOTOX INJECTION;  Surgeon: Wilford Corner, MD;  Location: [facility] ENDOSCOPY;  Service: Endoscopy;;   CHOLECYSTECTOMY     COLONOSCOPY  12/03/2011  Procedure: COLONOSCOPY;  Surgeon: [name], MD;  Location: [facility] ENDOSCOPY;  Service: Endoscopy;  Laterality: N/A;   ESOPHAGEAL MANOMETRY N/A 07/13/2017   Procedure: ESOPHAGEAL MANOMETRY (EM);  Surgeon: Wilford Corner, MD;  Location: [facility] ENDOSCOPY;  Service: Endoscopy;  Laterality: N/A;   ESOPHAGOGASTRODUODENOSCOPY  12/03/2011   Procedure: ESOPHAGOGASTRODUODENOSCOPY (EGD);  Surgeon: [name], MD;  Location: [facility] ENDOSCOPY;  Service: Endoscopy;  Laterality: N/A;   ESOPHAGOGASTRODUODENOSCOPY (EGD) WITH PROPOFOL N/A 08/29/2015   Procedure: ESOPHAGOGASTRODUODENOSCOPY (EGD) WITH PROPOFOL;  Surgeon: Wilford Corner, MD;  Location: [facility] ENDOSCOPY;  Service: Endoscopy;  Laterality: N/A;   ESOPHAGOGASTRODUODENOSCOPY (EGD) WITH PROPOFOL N/A 05/17/2019    Procedure: ESOPHAGOGASTRODUODENOSCOPY (EGD) WITH PROPOFOL;  Surgeon: Wilford Corner, MD;  Location: [facility] ENDOSCOPY;  Service: Endoscopy;  Laterality: N/A;   GIVENS CAPSULE STUDY N/A 08/05/2018   Procedure: GIVENS CAPSULE STUDY;  Surgeon: Wilford Corner, MD;  Location: [place];  Service: Endoscopy;  Laterality: N/A;   HOT HEMOSTASIS  12/03/2011   Procedure: HOT HEMOSTASIS (ARGON PLASMA COAGULATION/BICAP);  Surgeon: [name], MD;  Location: [facility] ENDOSCOPY;  Service: Endoscopy;  Laterality: N/A;   NECK SURGERY     ROTATOR CUFF REPAIR     right   ROTATOR CUFF REPAIR Right 2014   TONSILLECTOMY       OB History   No obstetric history on file.     Family History  Problem Relation Age of Onset   Diabetes Mother    Heart attack Brother    Cancer Maternal Aunt     Social History   Tobacco Use   Smoking status: Former    Types: Cigarettes    Quit date: 06/02/2000    Years since quitting: 20.6   Smokeless tobacco: Never  Vaping Use   Vaping Use: Never used  Substance Use Topics   Alcohol use: No   Drug use: No    Home Medications Prior to Admission medications   Medication Sig Start Date End Date Taking? Authorizing Provider  albuterol (VENTOLIN HFA) 108 (90 Base) MCG/ACT inhaler Inhale 2 puffs into the lungs every 6 (six) hours as needed for wheezing or shortness of breath.    Yes [provider]  aspirin EC 81 MG tablet Take 81 mg by mouth daily.   Yes [provider]  atorvastatin (LIPITOR) 40 MG tablet Take 40 mg by mouth daily.   Yes [provider]  carisoprodol (SOMA) 250 MG tablet Take 250 mg by mouth 2 (two) times daily as needed (cramps).  05/04/19  Yes [provider]  CINNAMON PO Take 1 tablet by mouth daily. '1000mg'$    Yes [provider]  docusate sodium (COLACE) 100 MG capsule Take 100 mg by mouth daily.   Yes [provider]  donepezil (ARICEPT) 5 MG tablet Take 5 mg by mouth at bedtime.   Yes [provider]  ferrous sulfate ([employer] IRON) 325 (65 FE) MG tablet TAKE 1 TABLET EVERY DAY . PLEASE TAKE 1 PILL DAILY WITH ORANGE JUICE OR 500 UNITS OF VITAMIN C Patient taking differently: Take 325 mg by mouth daily with breakfast. 03/02/20  Yes Orson Slick, MD  furosemide (LASIX) 20 MG tablet Take 20 mg by mouth daily.    Yes [provider]  levothyroxine (SYNTHROID) 125 MCG tablet Take 125 mcg by mouth daily before breakfast.   Yes [provider]  Magnesium Oxide 420 (252 Mg) MG TABS Take 420 mg by mouth daily.   Yes [provider]  montelukast (  SINGULAIR) 10 MG tablet Take 10 mg by mouth daily.     Yes [provider]  Multiple Vitamins-Minerals (HAIR/SKIN/NAILS/BIOTIN PO) Take 1 tablet by mouth daily.   Yes [provider]  nitroGLYCERIN (NITROSTAT) 0.4 MG SL tablet Place 0.4 mg under the tongue every 5 (five) minutes as needed for chest pain.   Yes [provider]  Omega-3 Fatty Acids (FISH OIL BURP-LESS) 1200 MG CAPS Take 2 capsules by mouth daily.   Yes [provider]  omeprazole (PRILOSEC) 40 MG capsule Take 40 mg by mouth daily.   Yes [provider]  Cyanocobalamin (VITAMIN B 12) 500 MCG TABS Take 1,000 mg by mouth every morning.    [provider]    Allergies    Oxycodone and Oxycodone  Review of Systems   Review of Systems  Constitutional:  Negative for chills and fever.  HENT:  Negative for ear pain and sore throat.   Eyes:  Negative for pain and visual disturbance.  Respiratory:  Negative for cough and shortness of breath.   Cardiovascular:  Negative for chest pain and palpitations.  Gastrointestinal:  Negative for abdominal pain and vomiting.  Musculoskeletal:  Negative for arthralgias and back pain.  Skin:  Negative for color change and rash.  Neurological:  Negative for syncope, light-headedness and headaches.  All other systems reviewed and are negative.  Physical Exam Updated  Vital Signs BP 118/77   Pulse 79   Temp 99.1 F (37.3 C) (Oral)   Resp (!) 21   SpO2 100%   Physical Exam Constitutional:      General: She is not in acute distress. HENT:     Head: Normocephalic and atraumatic.  Eyes:     Conjunctiva/sclera: Conjunctivae normal.     Pupils: Pupils are equal, round, and reactive to light.  Cardiovascular:     Rate and Rhythm: Normal rate and regular rhythm.  Pulmonary:     Effort: Pulmonary effort is normal. No respiratory distress.  Abdominal:     General: There is no distension.     Tenderness: There is no abdominal tenderness.  Skin:    General: Skin is warm and dry.  Neurological:     General: No focal deficit present.     Mental Status: She is alert. Mental status is at baseline.  Psychiatric:        Mood and Affect: Mood normal.        Behavior: Behavior normal.    ED Results / Procedures / Treatments   Labs (all labs ordered are listed, but only abnormal results are displayed) Labs Reviewed  CBC WITH DIFFERENTIAL/PLATELET - Abnormal; Notable for the following components:      Result Value   Hemoglobin 9.6 (*)    HCT 32.1 (*)    MCV 74.5 (*)    MCH 22.3 (*)    MCHC 29.9 (*)    RDW 16.8 (*)    All other components within normal limits  COMPREHENSIVE METABOLIC PANEL - Abnormal; Notable for the following components:   Glucose, Bld 113 (*)    Creatinine, Ser 1.11 (*)    GFR, Estimated 53 (*)    All other components within normal limits  I-STAT CHEM 8, ED - Abnormal; Notable for the following components:   Creatinine, Ser 1.10 (*)    Glucose, Bld 114 (*)    Hemoglobin 10.5 (*)    HCT 31.0 (*)    All other components within normal limits  RESP PANEL BY RT-PCR (  FLU A&B, COVID) [id number]  LIPASE, BLOOD    EKG None  Radiology No results found.  Procedures Procedures   Medications Ordered in ED Medications - No data to display  ED Course  I have reviewed the triage vital signs and the nursing notes.  Pertinent labs  & imaging results that were available during my care of the patient were reviewed by me and considered in my medical decision making (see chart for details).  Patient presenting to the ED today with incidental finding of 7.5 cm abdominal aortic aneurysm on outpatient ultrasound 2 days ago.  She has had no syncope, no abdominal pain or back pain, no active symptoms.  She is well-appearing on exam.  Last imaging that I can find was over 10 years ago at which point her aneurysm was 4.5 cm.  She is not actively smoker.  Will obtain a CT aortogram while she is in the ED, and I will discussed the case with vascular surgery.  I reviewed her labs today which were largely unremarkable.  No leukocytosis, hemoglobin at baseline, creatinine at baseline, no electrolyte abnormalities.  LFTs and lipase are unremarkable.  Supplemental history provided by the patient's Lacey Vega at bedside.  Clinical Course as of 01/31/21 1709  Thu Jan 31, 2021  1422 SARS Coronavirus 2 by RT PCR(!): POSITIVE [MT]  1422 COVID-positive is likely the source of her nausea [MT]  1527 Patient and Lacey Vega updated about her COVID status.  Her Lacey Vega reports that the Lacey Vega tested positive for COVID about 2 months ago, the patient did have some fevers and congestion at that time.  I explained it is possible that this is a still a positive result from 2 months ago.  The patient does not have any active nausea, abdominal pain, or any other active symptoms of COVID at this time. [MT]  [id number] Paged vascular surgery regarding findings - 7.1 cm maximal diameter. Patient remains pain free. [MT]  1614 Dr Roselie Awkward to evaluate patient regarding AAA [MT]    Clinical Course User Index [MT] Kada Friesen, Carola Rhine, MD   Final Clinical Impression(s) / ED Diagnoses Final diagnoses:  None    Rx / DC Orders ED Discharge Orders     None        Wyvonnia Dusky, MD 01/31/21 1711

## 2021-02-01 ENCOUNTER — Inpatient Hospital Stay (HOSPITAL_COMMUNITY): Payer: Medicare HMO

## 2021-02-01 ENCOUNTER — Encounter (HOSPITAL_COMMUNITY): Payer: Self-pay | Admitting: Vascular Surgery

## 2021-02-01 DIAGNOSIS — I714 Abdominal aortic aneurysm, without rupture, unspecified: Secondary | ICD-10-CM | POA: Diagnosis present

## 2021-02-01 LAB — POCT I-STAT 7, (LYTES, BLD GAS, ICA,H+H)
Acid-Base Excess: 1 mmol/L (ref 0.0–2.0)
Acid-base deficit: 2 mmol/L (ref 0.0–2.0)
Acid-base deficit: 2 mmol/L (ref 0.0–2.0)
Bicarbonate: 25.6 mmol/L (ref 20.0–28.0)
Bicarbonate: 23.5 mmol/L (ref 20.0–28.0)
Bicarbonate: 24.4 mmol/L (ref 20.0–28.0)
Calcium, Ion: 1.22 mmol/L (ref 1.15–1.40)
Calcium, Ion: 1.18 mmol/L (ref 1.15–1.40)
Calcium, Ion: 1.18 mmol/L (ref 1.15–1.40)
HCT: 23 % — ABNORMAL LOW (ref 36.0–46.0)
HCT: 25 % — ABNORMAL LOW (ref 36.0–46.0)
HCT: 26 % — ABNORMAL LOW (ref 36.0–46.0)
Hemoglobin: 7.8 g/dL — ABNORMAL LOW (ref 12.0–15.0)
Hemoglobin: 8.5 g/dL — ABNORMAL LOW (ref 12.0–15.0)
Hemoglobin: 8.8 g/dL — ABNORMAL LOW (ref 12.0–15.0)
O2 Saturation: 100 %
O2 Saturation: 100 %
O2 Saturation: 100 %
Patient temperature: 36.7
Patient temperature: 35.8
Patient temperature: 36.7
Potassium: 4.8 mmol/L (ref 3.5–5.1)
Potassium: 4.5 mmol/L (ref 3.5–5.1)
Potassium: 5 mmol/L (ref 3.5–5.1)
Sodium: 141 mmol/L (ref 135–145)
Sodium: 141 mmol/L (ref 135–145)
Sodium: 142 mmol/L (ref 135–145)
TCO2: 27 mmol/L (ref 22–32)
TCO2: 25 mmol/L (ref 22–32)
TCO2: 26 mmol/L (ref 22–32)
pCO2 arterial: 38.9 mmHg (ref 32.0–48.0)
pCO2 arterial: 39.5 mmHg (ref 32.0–48.0)
pCO2 arterial: 43.5 mmHg (ref 32.0–48.0)
pH, Arterial: 7.425 (ref 7.350–7.450)
pH, Arterial: 7.351 (ref 7.350–7.450)
pH, Arterial: 7.382 (ref 7.350–7.450)
pO2, Arterial: 208 mmHg — ABNORMAL HIGH (ref 83.0–108.0)
pO2, Arterial: 215 mmHg — ABNORMAL HIGH (ref 83.0–108.0)
pO2, Arterial: 252 mmHg — ABNORMAL HIGH (ref 83.0–108.0)

## 2021-02-01 LAB — CBC
HCT: 27.7 % — ABNORMAL LOW (ref 36.0–46.0)
Hemoglobin: 8.6 g/dL — ABNORMAL LOW (ref 12.0–15.0)
MCH: 23.6 pg — ABNORMAL LOW (ref 26.0–34.0)
MCHC: 31 g/dL (ref 30.0–36.0)
MCV: 75.9 fL — ABNORMAL LOW (ref 80.0–100.0)
Platelets: 242 10*3/uL (ref 150–400)
RBC: 3.65 MIL/uL — ABNORMAL LOW (ref 3.87–5.11)
RDW: 17.2 % — ABNORMAL HIGH (ref 11.5–15.5)
WBC: 9.9 10*3/uL (ref 4.0–10.5)
nRBC: 0 % (ref 0.0–0.2)

## 2021-02-01 LAB — BASIC METABOLIC PANEL
Anion gap: 7 (ref 5–15)
BUN: 13 mg/dL (ref 8–23)
CO2: 23 mmol/L (ref 22–32)
Calcium: 8.4 mg/dL — ABNORMAL LOW (ref 8.9–10.3)
Chloride: 106 mmol/L (ref 98–111)
Creatinine, Ser: 1.11 mg/dL — ABNORMAL HIGH (ref 0.44–1.00)
GFR, Estimated: 53 mL/min — ABNORMAL LOW (ref >60)
Glucose, Bld: 158 mg/dL — ABNORMAL HIGH (ref 70–99)
Potassium: 4.6 mmol/L (ref 3.5–5.1)
Sodium: 136 mmol/L (ref 135–145)

## 2021-02-01 LAB — POCT ACTIVATED CLOTTING TIME
Activated Clotting Time: 236 seconds
Activated Clotting Time: 237 seconds
Activated Clotting Time: 248 seconds
Activated Clotting Time: 237 seconds
Activated Clotting Time: 242 seconds
Activated Clotting Time: 271 seconds

## 2021-02-01 LAB — MAGNESIUM: Magnesium: 2 mg/dL (ref 1.7–2.4)

## 2021-02-01 LAB — APTT
aPTT: >200 seconds (ref 24–36)
aPTT: 63 seconds — ABNORMAL HIGH (ref 24–36)

## 2021-02-01 LAB — PROTIME-INR
INR: 1.2 (ref 0.8–1.2)
Prothrombin Time: 15.6 seconds — ABNORMAL HIGH (ref 11.4–15.2)

## 2021-02-01 LAB — GLUCOSE, CAPILLARY: Glucose-Capillary: 155 mg/dL — ABNORMAL HIGH (ref 70–99)

## 2021-02-01 MED ORDER — ONDANSETRON HCL 4 MG/2ML IJ SOLN
INTRAMUSCULAR | Status: DC | PRN
  Administered 2021-02-01: 4 mg via INTRAVENOUS

## 2021-02-01 MED ORDER — HYDRALAZINE HCL 20 MG/ML IJ SOLN
5.0000 mg | INTRAMUSCULAR | Status: DC | PRN
Start: 2021-02-01 — End: 2021-02-05

## 2021-02-01 MED ORDER — PANTOPRAZOLE SODIUM 40 MG PO TBEC
40.0000 mg | DELAYED_RELEASE_TABLET | Freq: Every day | ORAL | Status: DC
  Administered 2021-02-01 – 2021-02-05 (×5): 40 mg via ORAL
  Filled 2021-02-01 (×5): qty 1

## 2021-02-01 MED ORDER — ALBUTEROL SULFATE HFA 108 (90 BASE) MCG/ACT IN AERS: 2.0000 | INHALATION_SPRAY | Freq: Four times a day (QID) | RESPIRATORY_TRACT | Status: DC | PRN

## 2021-02-01 MED ORDER — DONEPEZIL HCL 5 MG PO TABS
5.0000 mg | ORAL_TABLET | Freq: Every day | ORAL | Status: DC
  Administered 2021-02-01 – 2021-02-04 (×4): 5 mg via ORAL
  Filled 2021-02-01 (×4): qty 1

## 2021-02-01 MED ORDER — CEFAZOLIN SODIUM-DEXTROSE 2-3 GM-%(50ML) IV SOLR
INTRAVENOUS | Status: DC | PRN
  Administered 2021-02-01: 2 g via INTRAVENOUS

## 2021-02-01 MED ORDER — ACETAMINOPHEN 650 MG RE SUPP: 325.0000 mg | RECTAL | Status: DC | PRN

## 2021-02-01 MED ORDER — CEFAZOLIN SODIUM-DEXTROSE 2-4 GM/100ML-% IV SOLN
2.0000 g | Freq: Three times a day (TID) | INTRAVENOUS | Status: AC
Start: 2021-02-01 — End: 2021-02-01
  Administered 2021-02-01 (×2): 2 g via INTRAVENOUS
  Filled 2021-02-01 (×2): qty 100

## 2021-02-01 MED ORDER — ASPIRIN EC 81 MG PO TBEC
81.0000 mg | DELAYED_RELEASE_TABLET | Freq: Every day | ORAL | Status: DC
  Administered 2021-02-01 – 2021-02-05 (×5): 81 mg via ORAL
  Filled 2021-02-01 (×5): qty 1

## 2021-02-01 MED ORDER — LEVOTHYROXINE SODIUM 25 MCG PO TABS
125.0000 ug | ORAL_TABLET | Freq: Every day | ORAL | Status: DC
  Administered 2021-02-01 – 2021-02-05 (×5): 125 ug via ORAL
  Filled 2021-02-01 (×6): qty 1

## 2021-02-01 MED ORDER — HEPARIN 6000 UNIT IRRIGATION SOLUTION
Status: DC | PRN
  Administered 2021-02-01: 1

## 2021-02-01 MED ORDER — METOPROLOL TARTRATE 5 MG/5ML IV SOLN: 2.0000 mg | INTRAVENOUS | Status: DC | PRN

## 2021-02-01 MED ORDER — ALUM & MAG HYDROXIDE-SIMETH 200-200-20 MG/5ML PO SUSP: 15.0000 mL | ORAL | Status: DC | PRN

## 2021-02-01 MED ORDER — HEPARIN SODIUM (PORCINE) 1000 UNIT/ML IJ SOLN
INTRAMUSCULAR | Status: DC | PRN
  Administered 2021-02-01: 10000 [IU] via INTRAVENOUS

## 2021-02-01 MED ORDER — PHENOL 1.4 % MT LIQD: 1.0000 | OROMUCOSAL | Status: DC | PRN

## 2021-02-01 MED ORDER — LABETALOL HCL 5 MG/ML IV SOLN: 10.0000 mg | INTRAVENOUS | Status: DC | PRN

## 2021-02-01 MED ORDER — POTASSIUM CHLORIDE CRYS ER 20 MEQ PO TBCR: 20.0000 meq | EXTENDED_RELEASE_TABLET | Freq: Every day | ORAL | Status: DC | PRN

## 2021-02-01 MED ORDER — ESMOLOL HCL 100 MG/10ML IV SOLN
INTRAVENOUS | Status: DC | PRN
  Administered 2021-02-01: 40 mg via INTRAVENOUS
  Administered 2021-02-01: 20 mg via INTRAVENOUS
  Administered 2021-02-01: 10 mg via INTRAVENOUS

## 2021-02-01 MED ORDER — HYDROMORPHONE HCL 1 MG/ML IJ SOLN
0.5000 mg | INTRAMUSCULAR | Status: DC | PRN
  Administered 2021-02-01 – 2021-02-02 (×5): 0.5 mg via INTRAVENOUS
  Filled 2021-02-01 (×6): qty 1

## 2021-02-01 MED ORDER — SODIUM CHLORIDE 0.9 % IV SOLN: 500.0000 mL | Freq: Once | INTRAVENOUS | Status: DC | PRN

## 2021-02-01 MED ORDER — EPHEDRINE SULFATE-NACL 50-0.9 MG/10ML-% IV SOSY
PREFILLED_SYRINGE | INTRAVENOUS | Status: DC | PRN
  Administered 2021-01-31: 5 mg via INTRAVENOUS

## 2021-02-01 MED ORDER — DOCUSATE SODIUM 100 MG PO CAPS
100.0000 mg | ORAL_CAPSULE | Freq: Every day | ORAL | Status: DC
  Administered 2021-02-01 – 2021-02-05 (×5): 100 mg via ORAL
  Filled 2021-02-01 (×5): qty 1

## 2021-02-01 MED ORDER — FENTANYL CITRATE (PF) 100 MCG/2ML IJ SOLN
INTRAMUSCULAR | Status: AC
  Filled 2021-02-01: qty 2

## 2021-02-01 MED ORDER — NAPHAZOLINE-GLYCERIN 0.012-0.25 % OP SOLN
1.0000 [drp] | Freq: Four times a day (QID) | OPHTHALMIC | Status: DC | PRN
  Administered 2021-02-01: 2 [drp] via OPHTHALMIC
  Filled 2021-02-01: qty 15

## 2021-02-01 MED ORDER — HEPARIN (PORCINE) 25000 UT/250ML-% IV SOLN
500.0000 [IU]/h | INTRAVENOUS | Status: DC
  Administered 2021-02-01: 500 [IU]/h via INTRAVENOUS
  Filled 2021-02-01 (×2): qty 250

## 2021-02-01 MED ORDER — DEXAMETHASONE SODIUM PHOSPHATE 10 MG/ML IJ SOLN
INTRAMUSCULAR | Status: DC | PRN
  Administered 2021-02-01: 5 mg via INTRAVENOUS

## 2021-02-01 MED ORDER — SODIUM CHLORIDE 0.9 % IV SOLN: INTRAVENOUS | Status: AC

## 2021-02-01 MED ORDER — MONTELUKAST SODIUM 10 MG PO TABS
10.0000 mg | ORAL_TABLET | Freq: Every day | ORAL | Status: DC
  Administered 2021-02-01 – 2021-02-05 (×5): 10 mg via ORAL
  Filled 2021-02-01 (×5): qty 1

## 2021-02-01 MED ORDER — ATORVASTATIN CALCIUM 40 MG PO TABS
40.0000 mg | ORAL_TABLET | Freq: Every day | ORAL | Status: DC
  Administered 2021-02-01 – 2021-02-05 (×5): 40 mg via ORAL
  Filled 2021-02-01 (×5): qty 1

## 2021-02-01 MED ORDER — 0.9 % SODIUM CHLORIDE (POUR BTL) OPTIME
TOPICAL | Status: DC | PRN
  Administered 2021-02-01: 3000 mL

## 2021-02-01 MED ORDER — ACETAMINOPHEN 325 MG PO TABS
325.0000 mg | ORAL_TABLET | ORAL | Status: DC | PRN
  Administered 2021-02-01 – 2021-02-05 (×6): 650 mg via ORAL
  Filled 2021-02-01 (×6): qty 2

## 2021-02-01 MED ORDER — SUGAMMADEX SODIUM 200 MG/2ML IV SOLN
INTRAVENOUS | Status: DC | PRN
  Administered 2021-02-01 (×2): 200 mg via INTRAVENOUS

## 2021-02-01 MED ORDER — CHLORHEXIDINE GLUCONATE CLOTH 2 % EX PADS
6.0000 | MEDICATED_PAD | Freq: Every day | CUTANEOUS | Status: DC
  Administered 2021-02-01 – 2021-02-02 (×2): 6 via TOPICAL

## 2021-02-01 MED ORDER — MAGNESIUM SULFATE 2 GM/50ML IV SOLN
2.0000 g | Freq: Every day | INTRAVENOUS | Status: DC | PRN
Start: 2021-02-01 — End: 2021-02-05

## 2021-02-01 MED ORDER — GUAIFENESIN-DM 100-10 MG/5ML PO SYRP: 15.0000 mL | ORAL_SOLUTION | ORAL | Status: DC | PRN

## 2021-02-01 MED ORDER — ONDANSETRON HCL 4 MG/2ML IJ SOLN: 4.0000 mg | Freq: Four times a day (QID) | INTRAMUSCULAR | Status: DC | PRN

## 2021-02-01 NOTE — Progress Notes (Signed)
Physical Therapy Evaluation Patient Details Name: Lacey Vega MRN: [id number] DOB: 1951/05/10 Today's Date: 02/01/2021   History of Present Illness  Lacey Vega is a 70 y.o. female admitted 9/1 referred to the ER by her primary care physician after outpatient duplex ultrasound revealed large infrarenal abdominal aortic aneurysm.  A CT angiogram was performed in the [facility] emergency department and demonstrated a 75 mm infrarenal abdominal aortic aneurysm with anterior sac irregularity. Had abdominal anerysm repair 9/1.  Pt with complication of thromboembolism to left leg. Left leg thrombectomy emergently on 9/1 after the first surgery. Pt with COVID as well. PMH: Anxiety, asthma, DM, HTN, TIA  Clinical Impression  Pt admitted with above diagnosis. Pt was able to ambulate in room with RW with good balance overall. Should progress well.  Pt currently with functional limitations due to the deficits listed below (see PT Problem List). Pt will benefit from skilled PT to increase their independence and safety with mobility to allow discharge to the venue listed below.       Follow Up Recommendations No PT follow up    Equipment Recommendations  None recommended by PT    Recommendations for Other Services       Precautions / Restrictions Precautions Precautions: Fall Restrictions Weight Bearing Restrictions: No      Mobility  Bed Mobility Overal bed mobility: Needs Assistance Bed Mobility: Supine to Sit     Supine to sit: Min assist     General bed mobility comments: A little assist to come to EOB for trunk elevation    Transfers Overall transfer level: Needs assistance Equipment used: Rolling walker (2 wheeled) Transfers: Sit to/from Stand Sit to Stand: Min guard         General transfer comment: Pt needed cues for hand placement only.  Ambulation/Gait Ambulation/Gait assistance: Min guard Gait Distance (Feet): 55 Feet Assistive device: Rolling  walker (2 wheeled) Gait Pattern/deviations: Step-to pattern;Decreased step length - left;Decreased stance time - left;Decreased weight shift to left;Antalgic;Drifts right/left   Gait velocity interpretation: <1.31 ft/sec, indicative of household ambulator General Gait Details: Pt was able to ambulate in room and progress distance around room (had to stay in room due to [place]). No LOB with RW and once she learned how to sequence steps and RW, doing well.  Stairs            Wheelchair Mobility    Modified Rankin (Stroke Patients Only)       Balance Overall balance assessment: Needs assistance Sitting-balance support: No upper extremity supported;Feet supported Sitting balance-Leahy Scale: Fair     Standing balance support: Bilateral upper extremity supported;During functional activity Standing balance-Leahy Scale: Poor Standing balance comment: relies on UEs to unweight left LE due to pain                             Pertinent Vitals/Pain Pain Assessment: Faces Faces Pain Scale: Hurts little more Pain Location: groin and left LE Pain Descriptors / Indicators: Aching;Discomfort Pain Intervention(s): Limited activity within patient's tolerance;Monitored during session;Repositioned;Premedicated before session    Home Living Family/patient expects to be discharged to:: Private residence Living Arrangements: Children (daughter works nights/sleeps during the day and grandchildren go to school;) Available Help at Discharge: Family;Available PRN/intermittently Type of Home: House Home Access: Stairs to enter Entrance Stairs-Rails: None Entrance Stairs-Number of Steps: 2 Home Layout: Laundry or work area in basement;Two level;Able to live on main level with bedroom/bathroom Home Equipment:  Cane - single point;Shower seat;Toilet riser;Bedside commode;Walker - 2 wheels      Prior Function Level of Independence: Independent               Hand Dominance    Dominant Hand: Right    Extremity/Trunk Assessment   Upper Extremity Assessment Upper Extremity Assessment: Defer to OT evaluation    Lower Extremity Assessment Lower Extremity Assessment: Generalized weakness    Cervical / Trunk Assessment Cervical / Trunk Assessment: Normal  Communication   Communication: No difficulties  Cognition Arousal/Alertness: Awake/alert Behavior During Therapy: WFL for tasks assessed/performed Overall Cognitive Status: Within Functional Limits for tasks assessed                                        General Comments General comments (skin integrity, edema, etc.): VSS    Exercises General Exercises - Lower Extremity Ankle Circles/Pumps: AROM;Both;10 reps;Seated Long Arc Quad: AROM;Both;10 reps;Seated   Assessment/Plan    PT Assessment Patient needs continued PT services  PT Problem List Decreased activity tolerance;Decreased balance;Decreased mobility;Decreased knowledge of use of DME;Decreased safety awareness;Decreased knowledge of precautions;Cardiopulmonary status limiting activity       PT Treatment Interventions DME instruction;Gait training;Stair training;Functional mobility training;Therapeutic activities;Therapeutic exercise;Balance training;Patient/family education    PT Goals (Current goals can be found in the Care Plan section)  Acute Rehab PT Goals Patient Stated Goal: to go home PT Goal Formulation: With patient Time For Goal Achievement: 02/15/21 Potential to Achieve Goals: Good    Frequency Min 3X/week   Barriers to discharge        Co-evaluation               AM-PAC PT "6 Clicks" Mobility  Outcome Measure Help needed turning from your back to your side while in a flat bed without using bedrails?: None Help needed moving from lying on your back to sitting on the side of a flat bed without using bedrails?: A Little Help needed moving to and from a bed to a chair (including a wheelchair)?: A  Little Help needed standing up from a chair using your arms (e.g., wheelchair or bedside chair)?: A Little Help needed to walk in hospital room?: A Little Help needed climbing 3-5 steps with a railing? : A Little 6 Click Score: 19    End of Session Equipment Utilized During Treatment: Gait belt Activity Tolerance: Patient tolerated treatment well Patient left: in chair;with call bell/phone within reach;with chair alarm set Nurse Communication: Mobility status PT Visit Diagnosis: Muscle weakness (generalized) (M62.81)    Time: [id number] PT Time Calculation (min) (ACUTE ONLY): 24 min   Charges:   PT Evaluation $PT Eval Moderate Complexity: 1 Mod PT Treatments $Gait Training: 8-22 mins        Tionne Dayhoff M,PT Acute Rehab Services [phone] (pager)   Alvira Philips 02/01/2021, 1:35 PM

## 2021-02-01 NOTE — Progress Notes (Signed)
Uncontrolled pain, contraindication noted due to oxycodone allergies. Patient states she has never taken morphine before or Toradol. She states she Is in excruciating pain. Contacted Vascular to address. Call back pending

## 2021-02-01 NOTE — Op Note (Signed)
DATE OF SERVICE: 02/01/2021  PATIENT:  Lacey Vega  70 y.o. female  PRE-OPERATIVE DIAGNOSIS: Left leg acute limb ischemia after EVAR  POST-OPERATIVE DIAGNOSIS:  Same  PROCEDURE:   left leg femoral-popliteal thrombectomy via below-knee popliteal exposure  SURGEON:  Surgeon(s) and Role:    * Cherre Robins, MD - Primary  ASSISTANT: none  ANESTHESIA:   general  EBL: 150  BLOOD ADMINISTERED:none  DRAINS: none   LOCAL MEDICATIONS USED:  NONE  SPECIMEN:  none  COUNTS: confirmed correct.  TOURNIQUET:  none  PATIENT DISPOSITION:  PACU - hemodynamically stable.   Delay start of Pharmacological VTE agent (>24hrs) due to surgical blood loss or risk of bleeding: no  INDICATION FOR PROCEDURE: [name] is a 70 y.o. female with left leg pain, numbness, absent Doppler flow after EVAR..  I discussed the findings with the patient's daughter and proceed to the operating room urgently.  She understood and was amenable to this.  Because of the emergent nature of the findings formal consent was not obtained.  OPERATIVE FINDINGS: Small piece of chronic thrombus retrieved from first popliteal thrombectomy.  No further clot returned.  Modest backbleeding achieved.  Good pulse throughout the exposed popliteal artery into the tibial trifurcation.  Brisk Doppler flow at the PT upon completion.  DESCRIPTION OF PROCEDURE: After identification of the patient in the pre-operative holding area, the patient was transferred to the operating room. The patient was positioned supine on the operating room table. Anesthesia was induced. The left leg was prepped and draped in standard fashion. A surgical pause was performed confirming correct patient, procedure, and operative location.  Longitudinal incision was made over the medial, proximal calf.  The incision was carried down to subcu tissue until the fascia of the posterior compartment was identified and divided.  The semimembranosus and  semitendinosus tendons were divided.  The gastrocnemius was swept posteriorly.  The popliteal vascular bundle was identified and skeletonized.  Exposure was carried down on the popliteal artery until the tibial trifurcation was identified.  Patient was systemically heparinized.  Activated clotting time measurements were used throughout the case to confirm adequate anticoagulation.  The popliteal artery was clamped proximally distally.  A longitudinal arteriotomy was made in the popliteal artery and a proximal thrombectomy was performed.  This returned a small piece of chronic appearing thrombus.  Good inflow was achieved.  Moderate backbleeding was noted.  A pulse was noted across the repair.  Doppler flow was noted across the repair.  No Doppler flow was initially noted at the ankle.  I carried my exposure distally on the posterior tibial artery and noted the artery was pulsatile.  I listened to the ankle again with a Doppler machine and noted brisk Doppler signal at the posterior tibial artery.    Upon completion of the case instrument and sharps counts were confirmed correct. The patient was transferred to the PACU in good condition. I was present for all portions of the procedure.  [name], MD [facility] Phone Number: [phone] 02/01/2021 1:25 AM

## 2021-02-01 NOTE — Brief Op Note (Signed)
02/01/2021  1:21 AM  PATIENT:  Lacey Vega  70 y.o. female  PRE-OPERATIVE DIAGNOSIS:  left leg ischemia after EVAR  POST-OPERATIVE DIAGNOSIS:  same  PROCEDURE:  Procedure(s): THROMBECTOMY POPLITEAL  ARTERY (Left)  SURGEON:  Surgeon(s) and Role:    * Cherre Robins, MD - Primary  PHYSICIAN ASSISTANT: none  ASSISTANTS: none   ANESTHESIA:   general  EBL:  150 mL   BLOOD ADMINISTERED:none  DRAINS: none   LOCAL MEDICATIONS USED:  NONE  SPECIMEN:  No Specimen  DISPOSITION OF SPECIMEN:  N/A  COUNTS:  YES  TOURNIQUET:  none  DICTATION: .Dragon Dictation  PLAN OF CARE: Admit to inpatient   PATIENT DISPOSITION:  PACU - hemodynamically stable.   Delay start of Pharmacological VTE agent (>24hrs) due to surgical blood loss or risk of bleeding: no

## 2021-02-01 NOTE — Progress Notes (Addendum)
[facility] [place]  Subjective  - Left leg feeling better   Objective 130/61 71 97.8 F (36.6 C) 16 96%  Intake/Output Summary (Last 24 hours) at 02/01/2021 0704 Last data filed at 02/01/2021 0255 Gross per 24 hour  Intake 3637 ml  Output 1565 ml  Net 2072 ml    Left LE incision healing well with moderate edema, compartments soft Doppler signals AT/DP.  Motor intact and sensation.  Skin warm with bare hugger in place Right Doppler signals PT/DP B groins soft without hematoma Lungs non labored breathing Heart RRR   Assessment/Planning: POD # 1  PROCEDURE:   1) bilateral US guided common femoral artery access 2) right hypogastric artery coil embolization (8x14cm x 4) 3) endovascular aortic aneurysm repair with extension on to bilateral external iliac arteries 4) bilateral open common femoral artery exposure 5) right common femoral endarterectomy and bovine pericardial patch angioplasty  Doppler signal AT/DP on exam Left leg acute limb ischemia after EVAR, left leg femoral-popliteal thrombectomy via below-knee popliteal exposure.   HGB 8.6 stable post op anemia after PRBC Encouraged mobility.  Will order PT/OT  [name] 02/01/2021 7:04 AM  ___________________________  VASCULAR STAFF ADDENDUM: I have independently interviewed and examined the patient. I agree with the above. Pt doing well s/p EVAR c/b LLE ischemia resulting in LLE embolectomy. No abdominal pain. DP signal in the foot with normal sensory/motor exam. Both internal iliac arteries were covered during the index operation to seal the aneurysm, however this can lead to hypoperfusion to the pelvis.  Pt can have regular diet but will continue to monitor for new-onset abd pain, or blood BM.  If these occur, will have low threshold for colonic ischemia workup.   Cassandria Santee, MD [facility] Phone Number: [phone] 02/01/2021 9:42  AM     --  Laboratory Lab Results: Recent Labs    01/31/21 1137 01/31/21 1218 01/31/21 2039 02/01/21 0608  WBC 6.3  --   --  9.9  HGB 9.6*   < > 7.8* 8.6*  HCT 32.1*   < > 23.0* 27.7*  PLT 335  --   --  242   < > = values in this interval not displayed.   BMET Recent Labs    01/31/21 1137 01/31/21 1218 01/31/21 2039 02/01/21 0608  NA 137 139 141 136  K 4.1 4.1 4.8 4.6  CL 103 106  --  106  CO2 23  --   --  23  GLUCOSE 113* 114*  --  158*  BUN 14 15  --  13  CREATININE 1.11* 1.10*  --  1.11*  CALCIUM 9.2  --   --  8.4*    COAG Lab Results  Component Value Date   INR 1.2 02/01/2021   INR 0.94 03/26/2009   No results found for: PTT

## 2021-02-01 NOTE — Anesthesia Procedure Notes (Addendum)
Procedure Name: Intubation Date/Time: 01/31/2021 11:47 PM Performed by: Jearld Pies, CRNA Pre-anesthesia Checklist: Patient identified, Emergency Drugs available, Suction available and Patient being monitored Patient Re-evaluated:Patient Re-evaluated prior to induction Oxygen Delivery Method: Circle System Utilized Preoxygenation: Pre-oxygenation with 100% oxygen Induction Type: IV induction and Rapid sequence Laryngoscope Size: Glidescope and 4 Grade View: Grade I Tube type: Oral Tube size: 7.0 mm Number of attempts: 1 Airway Equipment and Method: Stylet Placement Confirmation: ETT inserted through vocal cords under direct vision, positive ETCO2 and breath sounds checked- equal and bilateral Secured at: 21 cm Tube secured with: Tape Dental Injury: Teeth and Oropharynx as per pre-operative assessment  Comments: Glidescope utilized d/t COVID precautions.

## 2021-02-01 NOTE — Anesthesia Postprocedure Evaluation (Signed)
Anesthesia Post Note  Patient: [place]  Procedure(s) Performed: ENDOVASCULAR ANEURYSM REPAIR (EVAR)Bilateral Groin Cutdown, left femoral endaterectomy with bovine patch angioplasty. (Bilateral)     Patient location during evaluation: PACU Anesthesia Type: General Level of consciousness: awake and alert Pain management: pain level controlled Vital Signs Assessment: post-procedure vital signs reviewed and stable Respiratory status: spontaneous breathing, nonlabored ventilation, respiratory function stable and patient connected to nasal cannula oxygen Cardiovascular status: blood pressure returned to baseline and stable Postop Assessment: no apparent nausea or vomiting Anesthetic complications: no Comments: Loss of dopplers noted in PACU. Surgeon with decision to return to OR   No notable events documented.  Last Vitals:  Vitals:   01/31/21 2310 01/31/21 2325  BP: 121/61 (!) 121/18  Pulse: 71 72  Resp: 18 16  Temp:    SpO2: 100% 100%    Last Pain:  Vitals:   01/31/21 2255  TempSrc:   PainSc: 8                  Peace Noyes P Shelene Krage

## 2021-02-01 NOTE — Transfer of Care (Signed)
Immediate Anesthesia Transfer of Care Note  Patient: [place]  Procedure(s) Performed: THROMBECTOMY POPLITEAL  ARTERY (Left: Leg Lower)  Patient Location: PACU  Anesthesia Type:General  Level of Consciousness: drowsy, patient cooperative and responds to stimulation  Airway & Oxygen Therapy: Patient Spontanous Breathing and Patient connected to face mask oxygen  Post-op Assessment: Report given to RN and Post -op Vital signs reviewed and stable  Post vital signs: Reviewed and stable  Last Vitals:  Vitals Value Taken Time  BP 124/72 02/01/21 0137  Temp    Pulse 61 02/01/21 0143  Resp 6 02/01/21 0143  SpO2 100 % 02/01/21 0143  Vitals shown include unvalidated device data.  Last Pain:  Vitals:   01/31/21 2255  TempSrc:   PainSc: 8          Complications: No notable events documented.

## 2021-02-01 NOTE — Anesthesia Preprocedure Evaluation (Signed)
Anesthesia Evaluation  Patient identified by MRN, date of birth, ID band Patient awake    Reviewed: Allergy & Precautions, H&P , NPO status , Patient's Chart, lab work & pertinent test results  Airway Mallampati: II   Neck ROM: full    Dental   Pulmonary shortness of breath, asthma , COPD, former smoker,  COVID+   breath sounds clear to auscultation       Cardiovascular hypertension,  Rhythm:regular Rate:Normal     Neuro/Psych  Headaches, PSYCHIATRIC DISORDERS Anxiety    GI/Hepatic GERD  ,  Endo/Other  diabetes, Type 2Hypothyroidism   Renal/GU      Musculoskeletal  (+) Arthritis ,   Abdominal   Peds  Hematology  (+) Blood dyscrasia, Sickle cell anemia and anemia ,   Anesthesia Other Findings   Reproductive/Obstetrics                             Anesthesia Physical  Anesthesia Plan  ASA: 3 and emergent  Anesthesia Plan: General   Post-op Pain Management:    Induction: Intravenous  PONV Risk Score and Plan: 3 and Ondansetron, Dexamethasone, Midazolam and Treatment may vary due to age or medical condition  Airway Management Planned: Oral ETT and Mask  Additional Equipment:   Intra-op Plan:   Post-operative Plan: Extubation in OR  Informed Consent: I have reviewed the patients History and Physical, chart, labs and discussed the procedure including the risks, benefits and alternatives for the proposed anesthesia with the patient or authorized representative who has indicated his/her understanding and acceptance.     Dental advisory given  Plan Discussed with: CRNA, Anesthesiologist and Surgeon  Anesthesia Plan Comments:         Anesthesia Quick Evaluation

## 2021-02-01 NOTE — Progress Notes (Signed)
Called to bedside in PACU to evaluate patient. Tender about calf incision. No tenderness with palpation of individual compartments in calf.  Improving doppler flow in foot with active warming with bair-hugger. Motor and sensory function intact in foot. Continue to warm leg.  Continue non-titrating heparin. Monitor neurovascular exam. OK for 4E.  [name], MD [facility] Phone Number: [phone] 02/01/2021 3:46 AM

## 2021-02-01 NOTE — Anesthesia Postprocedure Evaluation (Signed)
Anesthesia Post Note  Patient: Lacey Vega  Procedure(s) Performed: THROMBECTOMY POPLITEAL  ARTERY (Left: Leg Lower)     Patient location during evaluation: PACU Anesthesia Type: General Level of consciousness: awake and alert Pain management: pain level controlled Vital Signs Assessment: post-procedure vital signs reviewed and stable Respiratory status: spontaneous breathing, nonlabored ventilation, respiratory function stable and patient connected to nasal cannula oxygen Cardiovascular status: blood pressure returned to baseline and stable Postop Assessment: no apparent nausea or vomiting Anesthetic complications: no   No notable events documented.  Last Vitals:  Vitals:   02/01/21 0340 02/01/21 0355  BP: 117/76 117/72  Pulse: 71 66  Resp: 20 15  Temp:  36.6 C  SpO2: 100% 100%    Last Pain:  Vitals:   02/01/21 0355  TempSrc:   PainSc: 6                  Juno Alers P Brionne Mertz

## 2021-02-02 MED ORDER — HYDROMORPHONE HCL 1 MG/ML IJ SOLN
1.0000 mg | INTRAMUSCULAR | Status: DC | PRN
  Administered 2021-02-02 – 2021-02-03 (×2): 1 mg via INTRAVENOUS
  Filled 2021-02-02 (×2): qty 1

## 2021-02-02 NOTE — Evaluation (Signed)
Occupational Therapy Evaluation Patient Details Name: Lacey Vega MRN: [id number] DOB: 07/11/1950 Today's Date: 02/02/2021    History of Present Illness Lacey Vega is a 70 y.o. female admitted 9/1 referred to the ER by her primary care physician after outpatient duplex ultrasound revealed large infrarenal abdominal aortic aneurysm.  A CT angiogram was performed in the [facility] emergency department and demonstrated a 75 mm infrarenal abdominal aortic aneurysm with anterior sac irregularity. Had abdominal anerysm repair 9/1.  Pt with complication of thromboembolism to left leg. Left leg thrombectomy emergently on 9/1 after the first surgery. Pt with COVID as well. PMH: Anxiety, asthma, DM, HTN, TIA   Clinical Impression   Pt reported after they were in the chair for about a hour they had an increase in edema in BLE and now reporting tightness which is limiting ability to participate in session. Pt required from supine to sit with HOB elevated mod assist, sit to stand moderate assist and pt was with use of RW would slide BLE to scoot to [facility] and unable to clear the ground. She then required moderate assist to go from sitting to supine. Pt currently with functional limitations due to the deficits listed below (see OT Problem List).  Pt will benefit from skilled OT to increase their safety and independence with ADL and functional mobility for ADL to facilitate discharge to venue listed below.      Follow Up Recommendations  SNF;Supervision/Assistance - 24 hour (pt has had an increase in edema in BLE and pt reporting BLE feel tight and decrease in ability to use and limiting ability to complete tasks)    Equipment Recommendations  None recommended by OT    Recommendations for Other Services       Precautions / Restrictions Precautions Precautions: Fall Restrictions Weight Bearing Restrictions: No      Mobility Bed Mobility Overal bed mobility: Needs Assistance Bed  Mobility: Supine to Sit;Sit to Supine     Supine to sit: Mod assist;HOB elevated Sit to supine: Mod assist;HOB elevated   General bed mobility comments: Pt dependent on UE for scooting    Transfers Overall transfer level: Needs assistance Equipment used: Rolling walker (2 wheeled) Transfers: Sit to/from Stand Sit to Stand: Mod assist;From elevated surface         General transfer comment: Pt needed multiple attempts to trial    Balance Overall balance assessment: Needs assistance Sitting-balance support: Feet supported;Bilateral upper extremity supported (occaional no BUE support) Sitting balance-Leahy Scale: Poor     Standing balance support: Bilateral upper extremity supported Standing balance-Leahy Scale: Poor                             ADL either performed or assessed with clinical judgement   ADL Overall ADL's : Needs assistance/impaired Eating/Feeding: Independent;Sitting;Bed level   Grooming: Wash/dry hands;Wash/dry face;Set up;Cueing for safety;Cueing for sequencing;Sitting   Upper Body Bathing: Set up;Cueing for safety;Cueing for sequencing;Sitting   Lower Body Bathing: Maximal assistance;Cueing for safety;Cueing for sequencing;Sit to/from stand   Upper Body Dressing : Set up;Cueing for safety;Cueing for sequencing;Sitting   Lower Body Dressing: Maximal assistance;Cueing for safety;Cueing for sequencing;Sit to/from stand   Toilet Transfer: Moderate assistance;Stand-pivot;BSC;RW   Toileting- Clothing Manipulation and Hygiene: Maximal assistance;Cueing for safety;Cueing for sequencing;Sitting/lateral lean         General ADL Comments: Pt when standing was only able to slide BLE with max BUE support while holding onto walker  Vision Baseline Vision/History: 1 Wears glasses Ability to See in Adequate Light: 0 Adequate Patient Visual Report: No change from baseline       Perception     Praxis      Pertinent Vitals/Pain Pain  Assessment: Faces Faces Pain Scale: Hurts little more Pain Location: BLE Pain Descriptors / Indicators: Tightness Pain Intervention(s): Limited activity within patient's tolerance;Monitored during session     Hand Dominance Right   Extremity/Trunk Assessment Upper Extremity Assessment Upper Extremity Assessment: RUE deficits/detail;LUE deficits/detail (less then 90 degress of AROM and reports past hx of rotator cuff injuries) RUE Coordination: decreased gross motor LUE Coordination: decreased gross motor   Lower Extremity Assessment Lower Extremity Assessment: Defer to PT evaluation (increase in edema)   Cervical / Trunk Assessment Cervical / Trunk Assessment: Normal   Communication Communication Communication: No difficulties   Cognition Arousal/Alertness: Awake/alert Behavior During Therapy: WFL for tasks assessed/performed Overall Cognitive Status: Within Functional Limits for tasks assessed                                     General Comments       Exercises     Shoulder Instructions      Home Living Family/patient expects to be discharged to:: Private residence Living Arrangements: Children Available Help at Discharge: Family;Available PRN/intermittently Type of Home: House Home Access: Stairs to enter [employer] of Steps: 2 Entrance Stairs-Rails: None Home Layout: Laundry or work area in basement;Two level;Able to live on main level with bedroom/bathroom Alternate Level Stairs-Number of Steps: flight   Bathroom Shower/Tub: Occupational psychologist: Standard     Home Equipment: Cane - single point;Shower seat;Toilet riser;Bedside commode;Walker - 2 wheels          Prior Functioning/Environment Level of Independence: Independent        Comments: Pt reports they were clenaing the house prior to going into the ED        OT Problem List: Decreased strength;Decreased range of motion;Decreased activity  tolerance;Impaired balance (sitting and/or standing);Pain      OT Treatment/Interventions: Self-care/ADL training;Therapeutic exercise;DME and/or AE instruction;Therapeutic activities;Balance training;Patient/family education    OT Goals(Current goals can be found in the care plan section) Acute Rehab OT Goals Patient Stated Goal: to get my BLE to feel better OT Goal Formulation: With patient Time For Goal Achievement: 02/16/21 Potential to Achieve Goals: Good ADL Goals Pt Will Perform Upper Body Bathing: with modified independence;sitting Pt Will Perform Lower Body Bathing: with set-up;sit to/from stand Pt Will Transfer to Toilet: with supervision;ambulating;regular height toilet;grab bars Pt Will Perform Tub/Shower Transfer: with supervision;shower seat;ambulating;grab bars  OT Frequency: Min 2X/week   Barriers to D/C:            Co-evaluation              AM-PAC OT "6 Clicks" Daily Activity     Outcome Measure Help from another person eating meals?: None Help from another person taking care of personal grooming?: None Help from another person toileting, which includes using toliet, bedpan, or urinal?: A Lot Help from another person bathing (including washing, rinsing, drying)?: A Lot Help from another person to put on and taking off regular upper body clothing?: A Little Help from another person to put on and taking off regular lower body clothing?: A Lot 6 Click Score: 17   End of Session Equipment Utilized During Treatment: Gait belt;Rolling walker  Nurse Communication: Mobility status  Activity Tolerance: No increased pain;Other (comment) (edema in BLE with standing) Patient left: in bed;with call bell/phone within reach;with bed alarm set  OT Visit Diagnosis: Unsteadiness on feet (R26.81);Muscle weakness (generalized) (M62.81);Pain                Time: 1020-1101 OT Time Calculation (min): 41 min Charges:  OT General Charges $OT Visit: 1 Visit OT  Evaluation $OT Eval Low Complexity: 1 Low OT Treatments $Self Care/Home Management : 23-37 mins  Joeseph Amor OTR/L  Acute Rehab Services  [phone] office number [phone] pager number   Joeseph Amor 02/02/2021, 11:18 AM

## 2021-02-02 NOTE — Progress Notes (Addendum)
[facility] [place]  Subjective  - left leg feels swollen and heavy to move.  Patient Lacey Vega abdominal pain.  Objective (!) 128/52 85 97.6 F (36.4 C) (Oral) 17 100%  Intake/Output Summary (Last 24 hours) at 02/02/2021 0935 Last data filed at 02/02/2021 0400 Gross per 24 hour  Intake 370.5 ml  Output 400 ml  Net -29.5 ml    Left LE edema surrounding the calf incision.  Compartments soft, doppler Dp signal intact. Skin warm to touch without signs of ischemia. B groins soft without hematoma Lungs non labored breathing  Assessment/Planning: POD # 2  PROCEDURE:   1) bilateral US guided common femoral artery access 2) right hypogastric artery coil embolization (8x14cm x 4) 3) endovascular aortic aneurysm repair with extension on to bilateral external iliac arteries 4) bilateral open common femoral artery exposure 5) right common femoral endarterectomy and bovine pericardial patch angioplasty   Doppler signal AT/DP on exam Left leg acute limb ischemia after EVAR, left leg femoral-popliteal thrombectomy via below-knee popliteal exposure.   No abdominal pain or bloody BM.  If these occur, will have low threshold for colonic ischemia workup.  Both internal iliac arteries were covered during the index operation to seal the aneurysm, however this can lead to hypoperfusion to the pelvis.  Patient states she is having difficulty moving Encouraged mobility Pending better mobility prior to discharge   Lacey Vega 02/02/2021 9:35 AM --  Laboratory Lab Results: Recent Labs    01/31/21 1137 01/31/21 1218 02/01/21 0009 02/01/21 0608  WBC 6.3  --   --  9.9  HGB 9.6*   < > 8.8* 8.6*  HCT 32.1*   < > 26.0* 27.7*  PLT 335  --   --  242   < > = values in this interval not displayed.   BMET Recent Labs    01/31/21 1137 01/31/21 1218 01/31/21 2039 02/01/21 0009 02/01/21 0608  NA 137 139   < > 142 136  K 4.1 4.1   < > 4.5 4.6  CL 103 106  --    --  106  CO2 23  --   --   --  23  GLUCOSE 113* 114*  --   --  158*  BUN 14 15  --   --  13  CREATININE 1.11* 1.10*  --   --  1.11*  CALCIUM 9.2  --   --   --  8.4*   < > = values in this interval not displayed.    COAG Lab Results  Component Value Date   INR 1.2 02/01/2021   INR 0.94 03/26/2009   No results found for: PTT  I have interviewed the patient and examined the patient. I agree with the findings by the PA. Both feet are warm. Incisions look good. Mild left calf swelling.  No abdominal pain. Mobilize. She is Covid +   Gae Gallop, MD

## 2021-02-03 NOTE — Progress Notes (Signed)
Physical Therapy Treatment Patient Details Name: Ileane Berezin MRN: [id number] DOB: Feb 28, 1951 Today's Date: 02/03/2021    History of Present Illness Burnett Womac is a 70 y.o. female admitted 9/1 referred to the ER by her primary care physician after outpatient duplex ultrasound revealed large infrarenal abdominal aortic aneurysm.  A CT angiogram was performed in the [facility] emergency department and demonstrated a 75 mm infrarenal abdominal aortic aneurysm with anterior sac irregularity. Had abdominal anerysm repair 9/1.  Pt with complication of thromboembolism to left leg. Left leg thrombectomy emergently on 9/1 after the first surgery. Pt with COVID as well. PMH: Anxiety, asthma, DM, HTN, TIA    PT Comments    PA requesting PT to re-assess pt due to pt's functional decline with edema and pain in her lower extremities as pt required modA for bed mobility and transfers with OT yesterday. Pt does require increased time for all functional mobility, but she was able to progress from needing a light minA to come to stand the first rep to being able to come to stand multiple times with only supervision. Pt also progressed from min guard assist with gait to supervision as she displayed fairly good balance when using the RW. She ambulated up to ~90 ft with a RW without LOB. She was initially limited due to edema in her L leg causing pain, but as she began to progress with mobility and accept more weight on the leg the better she reported it felt and the better she was able to move. Educated pt on elevating leg and performing ankle pumps to further manage the edema and thus pain that limits her. Began to educate her on leg sequencing for stairs. Will continue to follow acutely. As pain appears to be her primary limiting factor in balance and mobility, expect she will make great progress as her pain decreases, thus no follow-up PT at d/c deemed necessary at this time.   Follow Up  Recommendations  No PT follow up     Equipment Recommendations  None recommended by PT    Recommendations for Other Services       Precautions / Restrictions Precautions Precautions: Fall Precaution Comments: COVID+ Restrictions Weight Bearing Restrictions: No    Mobility  Bed Mobility Overal bed mobility: Needs Assistance Bed Mobility: Supine to Sit     Supine to sit: Supervision;HOB elevated     General bed mobility comments: Extra time but pt able to transition supine > sit EOB with supervision using bed rails with HOB elevated.    Transfers Overall transfer level: Needs assistance Equipment used: Rolling walker (2 wheeled) Transfers: Sit to/from Stand Sit to Stand: Min assist;Supervision         General transfer comment: Sit to stand with very light minA first rep from EOB to RW, but progressed to supervision for subsequent x2 reps from recliner. Cues provided for hand placement, pt kicking L leg slightly anteriorly to reduce weight bearing during transfers.  Ambulation/Gait Ambulation/Gait assistance: Min guard;Supervision Gait Distance (Feet): 90 Feet Assistive device: Rolling walker (2 wheeled) Gait Pattern/deviations: Step-to pattern;Decreased step length - left;Decreased stance time - left;Decreased weight shift to left;Antalgic;Step-through pattern;Trunk flexed Gait velocity: reduced Gait velocity interpretation: <1.31 ft/sec, indicative of household ambulator General Gait Details: Pt with trunk flexed posture and intially displaying limited tolerance to weight bearing in L leg with step-to antalgic gait pattern. As distance progressed, pt began to tolerate more weight bearing on L Leg and displayed step-through gait pattern, progressing from  min guard > supervision. No LOB.   Stairs             Wheelchair Mobility    Modified Rankin (Stroke Patients Only)       Balance Overall balance assessment: Needs assistance Sitting-balance support:  No upper extremity supported;Feet supported Sitting balance-Leahy Scale: Fair     Standing balance support: Bilateral upper extremity supported;During functional activity Standing balance-Leahy Scale: Poor Standing balance comment: relies on UEs to unweight left LE due to pain                            Cognition Arousal/Alertness: Awake/alert Behavior During Therapy: WFL for tasks assessed/performed Overall Cognitive Status: Within Functional Limits for tasks assessed                                        Exercises      General Comments General comments (skin integrity, edema, etc.): Educated pt to elevate L leg and perform ankle pumps to manage edema      Pertinent Vitals/Pain Pain Assessment: Faces Faces Pain Scale: Hurts little more Pain Location: L leg Pain Descriptors / Indicators: Tightness;Discomfort;Guarding Pain Intervention(s): Limited activity within patient's tolerance;Monitored during session;Repositioned    Home Living                      Prior Function            PT Goals (current goals can now be found in the care plan section) Acute Rehab PT Goals Patient Stated Goal: to go home PT Goal Formulation: With patient Time For Goal Achievement: 02/15/21 Potential to Achieve Goals: Good Progress towards PT goals: Progressing toward goals    Frequency    Min 3X/week      PT Plan Current plan remains appropriate    Co-evaluation              AM-PAC PT "6 Clicks" Mobility   Outcome Measure  Help needed turning from your back to your side while in a flat bed without using bedrails?: None Help needed moving from lying on your back to sitting on the side of a flat bed without using bedrails?: A Little Help needed moving to and from a bed to a chair (including a wheelchair)?: A Little Help needed standing up from a chair using your arms (e.g., wheelchair or bedside chair)?: A Little Help needed to walk in  hospital room?: A Little Help needed climbing 3-5 steps with a railing? : A Little 6 Click Score: 19    End of Session Equipment Utilized During Treatment: Gait belt Activity Tolerance: Patient tolerated treatment well Patient left: in chair;with call bell/phone within reach;with chair alarm set Nurse Communication: Mobility status PT Visit Diagnosis: Muscle weakness (generalized) (M62.81);Unsteadiness on feet (R26.81);Other abnormalities of gait and mobility (R26.89);Difficulty in walking, not elsewhere classified (R26.2);Pain Pain - Right/Left: Left Pain - part of body: Leg     Time: [id number] PT Time Calculation (min) (ACUTE ONLY): 29 min  Charges:  $Gait Training: 8-22 mins $Therapeutic Activity: 8-22 mins                     Moishe Spice, PT, DPT Acute Rehabilitation Services  Pager: [phone] Office: [place] 02/03/2021, 4:06 PM

## 2021-02-03 NOTE — Progress Notes (Signed)
   VASCULAR SURGERY ASSESSMENT & PLAN:   POD # EVAR/RIGHT FEMORAL ENDARTERECTOMY: The patient is doing well with no abdominal pain.  She has brisk Doppler signals on the right and a monophasic anterior tibial signal on the left.  She will need physical therapy.  Her left calf has moderate swelling.   DVT PROPHYLAXIS: She is on 500 units an hour of heparin.  Leave this for now.  VASCULAR QUALITY INITIATIVE: She is on aspirin and a statin.  PHYSICAL THERAPY: Continue physical therapy.  Occupational Therapy has recommended a SNF.  SUBJECTIVE:   No specific complaints this morning.  PHYSICAL EXAM:   Vitals:   02/02/21 1927 02/02/21 2327 02/03/21 0548 02/03/21 0607  BP: (!) 112/50 (!) 105/43 (!) 97/39 (!) 105/42  Pulse: 85 99 88 89  Resp: (!) '21 17 18 20  '$ Temp: 99 F (37.2 C) (!) 100.8 F (38.2 C)  100.2 F (37.9 C)  TempSrc: Oral Oral Oral   SpO2: 100% 97% 99% 100%  Weight:      Height:       Incisions look fine. She has a brisk anterior tibial and posterior tibial signal on the right and a monophasic left anterior tibial signal. Left calf is moderate swelling but is soft. Abdomen is soft and nontender.  LABS:   Lab Results  Component Value Date   WBC 9.9 02/01/2021   HGB 8.6 (L) 02/01/2021   HCT 27.7 (L) 02/01/2021   MCV 75.9 (L) 02/01/2021   PLT 242 02/01/2021   Lab Results  Component Value Date   CREATININE 1.11 (H) 02/01/2021   Lab Results  Component Value Date   INR 1.2 02/01/2021   CBG (last 3)  Recent Labs    01/31/21 2309 02/01/21 0203  GLUCAP 136* 155*    PROBLEM LIST:    Active Problems:   Aortic aneurysm (HCC)   AAA (abdominal aortic aneurysm) (HCC)   CURRENT MEDS:    aspirin EC  81 mg Oral Daily   atorvastatin  40 mg Oral Daily   Chlorhexidine Gluconate Cloth  6 each Topical Daily   docusate sodium  100 mg Oral Daily   donepezil  5 mg Oral QHS   levothyroxine  125 mcg Oral Q0600   montelukast  10 mg Oral Daily   pantoprazole  40 mg  Oral Daily    [name] Office: [phone] 02/03/2021

## 2021-02-04 ENCOUNTER — Encounter (HOSPITAL_COMMUNITY): Payer: Self-pay | Admitting: Vascular Surgery

## 2021-02-04 ENCOUNTER — Other Ambulatory Visit: Payer: Self-pay

## 2021-02-04 LAB — BPAM RBC
Blood Product Expiration Date: 202209142359
Blood Product Expiration Date: 202209152359
ISSUE DATE / TIME: 202209011815
ISSUE DATE / TIME: 202209012047
Unit Type and Rh: 5100
Unit Type and Rh: 5100

## 2021-02-04 LAB — CBC
HCT: 22.3 % — ABNORMAL LOW (ref 36.0–46.0)
Hemoglobin: 7 g/dL — ABNORMAL LOW (ref 12.0–15.0)
MCH: 23.9 pg — ABNORMAL LOW (ref 26.0–34.0)
MCHC: 31.4 g/dL (ref 30.0–36.0)
MCV: 76.1 fL — ABNORMAL LOW (ref 80.0–100.0)
Platelets: 201 10*3/uL (ref 150–400)
RBC: 2.93 MIL/uL — ABNORMAL LOW (ref 3.87–5.11)
RDW: 18.9 % — ABNORMAL HIGH (ref 11.5–15.5)
WBC: 8.9 10*3/uL (ref 4.0–10.5)
nRBC: 0 % (ref 0.0–0.2)

## 2021-02-04 LAB — TYPE AND SCREEN
ABO/RH(D): A POS
Antibody Screen: NEGATIVE
Donor AG Type: NEGATIVE
Donor AG Type: NEGATIVE
Unit division: 0
Unit division: 0

## 2021-02-04 LAB — HEPARIN LEVEL (UNFRACTIONATED): Heparin Unfractionated: <0.1 IU/mL — ABNORMAL LOW (ref 0.30–0.70)

## 2021-02-04 LAB — PREPARE RBC (CROSSMATCH)

## 2021-02-04 MED ORDER — HEPARIN SODIUM (PORCINE) 5000 UNIT/ML IJ SOLN
5000.0000 [IU] | Freq: Three times a day (TID) | INTRAMUSCULAR | Status: DC
  Administered 2021-02-04 – 2021-02-05 (×4): 5000 [IU] via SUBCUTANEOUS
  Filled 2021-02-04 (×4): qty 1

## 2021-02-04 MED ORDER — SODIUM CHLORIDE 0.9% IV SOLUTION: Freq: Once | INTRAVENOUS | Status: AC

## 2021-02-04 NOTE — Care Management Important Message (Signed)
Important Message  Patient Details  Name: Lacey Vega MRN: [id number] Date of Birth: 1951-01-16   Medicare Important Message Given:  Yes     Shelda Altes 02/04/2021, 9:04 AM

## 2021-02-04 NOTE — Progress Notes (Signed)
Pt had an Oral temperature of or 100 around midnight, and received 2 tylenol, at the time of reassessment, the oral temperature was 99.2 but the patient feels hot and complains about burning up. A rectal temperature check gives me a  100.7 . CN notified and rapid response. CN advised to changed patient's to a cotton gown. The rapid response RN advised to applied ice pack unders patient's arms. The temperature in the room is turned down to 65, and the blanket removed from the patient. We'll continue to monitor.

## 2021-02-04 NOTE — Progress Notes (Addendum)
   VASCULAR SURGERY ASSESSMENT & PLAN:   POD 4 EVAR/RIGHT FEMORAL ENDARTERECTOMY: The patient is doing well with no abdominal pain. No bloody bowel movements reported. Tolerating diet. She has brisk Doppler signals on the right and a monophasic anterior tibial signal on the left.  She is afebrile and her white blood cell count is normal.  ANEMIA: Hgb 7 today. Will transfuse 2 units.    DVT PROPHYLAXIS: She is on 500 units an hour of heparin.  I will change this to subcu heparin.  VASCULAR QUALITY INITIATIVE: She is on aspirin and a statin.   PHYSICAL THERAPY: Continue physical therapy.  Occupational Therapy has recommended a SNF.   SUBJECTIVE:   No specific complaints this morning.  She was walking in her room some yesterday.  PHYSICAL EXAM:   Vitals:   02/04/21 0200 02/04/21 0205 02/04/21 0417 02/04/21 0744  BP:  (!) 129/57 (!) 126/55 (!) 113/53  Pulse:  85 77 79  Resp: '16 16 17 20  '$ Temp:  99.3 F (37.4 C) 99 F (37.2 C) 98.2 F (36.8 C)  TempSrc:   Oral Oral  SpO2:  98% 100% 100%  Weight:      Height:       Brisk right dorsalis pedis and posterior tibial signal. Brisk left anterior tibial signal. Left calf swelling about the same. Abdomen soft and nontender.  LABS:   Lab Results  Component Value Date   WBC 8.9 02/04/2021   HGB 7.0 (L) 02/04/2021   HCT 22.3 (L) 02/04/2021   MCV 76.1 (L) 02/04/2021   PLT 201 02/04/2021   Lab Results  Component Value Date   CREATININE 1.11 (H) 02/01/2021   Lab Results  Component Value Date   INR 1.2 02/01/2021    PROBLEM LIST:    Active Problems:   Aortic aneurysm (HCC)   AAA (abdominal aortic aneurysm) (HCC)   CURRENT MEDS:    aspirin EC  81 mg Oral Daily   atorvastatin  40 mg Oral Daily   Chlorhexidine Gluconate Cloth  6 each Topical Daily   docusate sodium  100 mg Oral Daily   donepezil  5 mg Oral QHS   levothyroxine  125 mcg Oral Q0600   montelukast  10 mg Oral Daily   pantoprazole  40 mg Oral Daily     Lacey Vega Office: [phone] 02/04/2021

## 2021-02-04 NOTE — Progress Notes (Signed)
Blood consent form obtained and placed in pt's chart.   Lucrezia Dehne S Emalene Welte, RN  

## 2021-02-05 ENCOUNTER — Encounter (HOSPITAL_COMMUNITY): Payer: Self-pay | Admitting: Vascular Surgery

## 2021-02-05 LAB — CBC
HCT: 29.2 % — ABNORMAL LOW (ref 36.0–46.0)
Hemoglobin: 9.5 g/dL — ABNORMAL LOW (ref 12.0–15.0)
MCH: 25.3 pg — ABNORMAL LOW (ref 26.0–34.0)
MCHC: 32.5 g/dL (ref 30.0–36.0)
MCV: 77.7 fL — ABNORMAL LOW (ref 80.0–100.0)
Platelets: 248 10*3/uL (ref 150–400)
RBC: 3.76 MIL/uL — ABNORMAL LOW (ref 3.87–5.11)
RDW: 19.2 % — ABNORMAL HIGH (ref 11.5–15.5)
WBC: 9.5 10*3/uL (ref 4.0–10.5)
nRBC: 0 % (ref 0.0–0.2)

## 2021-02-05 MED ORDER — TRAMADOL HCL 50 MG PO TABS: 50.0000 mg | ORAL_TABLET | Freq: Four times a day (QID) | ORAL | 0 refills | Status: DC | PRN

## 2021-02-05 NOTE — Discharge Summary (Signed)
[facility] Discharge Summary   Patient ID:  Lacey Vega MRN: [id number] DOB/AGE: [birth year] 70 y.o.  Admit date: 01/31/2021 Discharge date: 02/05/2021 Date of Surgery: 02/01/2021 Surgeon: Surgeon(s): Cherre Robins, MD  Admission Diagnosis: Aortic aneurysm Lacey Vega) [I71.9] AAA (abdominal aortic aneurysm) [facility] V[facility] [place]) [I71.4]  Discharge Diagnoses:  Aortic aneurysm (HCC) [I71.9] AAA (abdominal aortic aneurysm) (Lacey Vega) [I71.4]  Secondary Diagnoses: Past Medical History:  Diagnosis Date   Anemia    Anxiety    Asthma    Asthma    Bronchitis    hx   Colon polyps    hyperplastic   Diabetes ([place])    mild   Diabetes mellitus    DJD (degenerative joint disease), cervical    Dysphagia    history of   Dyspnea    Gallstones    GERD (gastroesophageal reflux disease)    H/O chest pain    secondary to anemia   Headache    History of arteriovenous malformation (AVM)    History of hiatal hernia    History of tobacco use    Hyperlipemia    Hypertension    Hypothyroidism    Iron deficiency anemia    Sickle cell anemia ([place])    "patient is not aware of this"   Thyroid disease    TIA (transient ischemic attack)    "was told that she possible had one"    Procedure(s): THROMBECTOMY POPLITEAL  ARTERY  Discharged Condition: good  HPI:  Lacey Vega is a 70 y.o. female with large aneurysm with aneurysm sac irregularity concerning for impending rupture.  Vega Course:  Lacey Vega is a 70 y.o. female is S/P Neither Procedure(s):  PROCEDURE:   1) bilateral US guided common femoral artery access 2) right hypogastric artery coil embolization (8x14cm x 4) 3) endovascular aortic aneurysm repair with extension on to bilateral external iliac arteries 4) bilateral open common femoral artery exposure 5) right common femoral endarterectomy and bovine   THROMBECTOMY POPLITEAL  ARTERY Post op  Examined patient in PACU. No doppler flow in the  left foot. Foot is numb and painful. Suspect thromboembolism to left leg.  Plan left leg thrombectomy emergently in OR. Family updated.  She was maintained on 500 of heparin until discharge.  Doppler signal AT/DP on exam.  Post op anemia due to blood loss she received 3 units of PRBC.  Stable HGB 9.5 prior to being discharged.  PT/OT worked with her for mobility she was sent home with Lacey Vega PT/OT.  ASA and Statin.   Consults:  Treatment Team:  Cherre Robins, MD  Significant Diagnostic Studies: CBC Lab Results  Component Value Date   WBC 9.5 02/05/2021   HGB 9.5 (L) 02/05/2021   HCT 29.2 (L) 02/05/2021   MCV 77.7 (L) 02/05/2021   PLT 248 02/05/2021    BMET    Component Value Date/Time   NA 136 02/01/2021 0608   K 4.6 02/01/2021 0608   CL 106 02/01/2021 0608   CO2 23 02/01/2021 0608   GLUCOSE 158 (H) 02/01/2021 0608   BUN 13 02/01/2021 0608   CREATININE 1.11 (H) 02/01/2021 0608   CREATININE 1.00 11/05/2020 1007   CALCIUM 8.4 (L) 02/01/2021 0608   GFRNONAA 53 (L) 02/01/2021 0608   GFRNONAA >60 11/05/2020 1007   GFRAA >60 02/15/2020 0804   COAG Lab Results  Component Value Date   INR 1.2 02/01/2021   INR 0.94 03/26/2009     Disposition:  Discharge to :Home Discharge Instructions  Call MD for:  redness, tenderness, or signs of infection (pain, swelling, bleeding, redness, odor or green/yellow discharge around incision site)   Complete by: As directed    Call MD for:  severe or increased pain, loss or decreased feeling  in affected limb(s)   Complete by: As directed    Call MD for:  temperature >100.5   Complete by: As directed    Resume previous diet   Complete by: As directed       Allergies as of 02/05/2021       Reactions   Oxycodone Shortness Of Breath   Oxycodone         Medication List     TAKE these medications    albuterol 108 (90 Base) MCG/ACT inhaler Commonly known as: VENTOLIN HFA Inhale 2 puffs into the lungs every 6 (six) hours  as needed for wheezing or shortness of breath.   aspirin EC 81 MG tablet Take 81 mg by mouth daily.   atorvastatin 40 MG tablet Commonly known as: LIPITOR Take 40 mg by mouth daily.   carisoprodol 250 MG tablet Commonly known as: SOMA Take 250 mg by mouth 2 (two) times daily as needed (cramps).   CINNAMON PO Take 1 tablet by mouth daily. '1000mg'$    docusate sodium 100 MG capsule Commonly known as: COLACE Take 100 mg by mouth daily.   donepezil 5 MG tablet Commonly known as: ARICEPT Take 5 mg by mouth at bedtime.   ferrous sulfate 325 (65 FE) MG tablet Commonly known as: [employer] Iron TAKE 1 TABLET EVERY DAY . PLEASE TAKE 1 PILL DAILY WITH ORANGE JUICE OR 500 UNITS OF VITAMIN C What changed:  how much to take how to take this when to take this additional instructions   Fish Oil Burp-Less 1200 MG Caps Take 2 capsules by mouth daily.   furosemide 20 MG tablet Commonly known as: LASIX Take 20 mg by mouth daily.   HAIR/SKIN/NAILS/BIOTIN PO Take 1 tablet by mouth daily.   levothyroxine 125 MCG tablet Commonly known as: SYNTHROID Take 125 mcg by mouth daily before breakfast.   Magnesium Oxide 420 (252 Mg) MG Tabs Take 420 mg by mouth daily.   montelukast 10 MG tablet Commonly known as: SINGULAIR Take 10 mg by mouth daily.   nitroGLYCERIN 0.4 MG SL tablet Commonly known as: NITROSTAT Place 0.4 mg under the tongue every 5 (five) minutes as needed for chest pain.   omeprazole 40 MG capsule Commonly known as: PRILOSEC Take 40 mg by mouth daily.   traMADol 50 MG tablet Commonly known as: Ultram Take 1 tablet (50 mg total) by mouth every 6 (six) hours as needed.   Vitamin B 12 500 MCG Tabs Take 1,000 mg by mouth every morning.       Verbal and written Discharge instructions given to the patient. Wound care per Discharge AVS  Follow-up Information     Cherre Robins, MD Follow up in 2 week(s).   Specialties: Vascular Surgery, Interventional Cardiology Why:  Office will call you to arrange your appt (sent) Contact information: [place] [address] [phone]                 Signed: [name] 02/05/2021, 1:22 PM  - For VQI Registry use --- Instructions: Press F2 to tab through selections.  Delete question if not applicable.   Post-op:  Time to Extubation: [ x] In OR, '[ ]'$  < 12 hrs, '[ ]'$  12-24 hrs, '[ ]'$  >=  24 hrs Vasopressors Req. Post-op: No MI: '[ ]'$  No, '[ ]'$  Troponin only, '[ ]'$  EKG or Clinical New Arrhythmia: No CHF: No ICU Stay: 0 days Transfusion: Yes  If yes, 3 units given  Complications: Resp failure: [ x] none, '[ ]'$  Pneumonia, '[ ]'$  Ventilator Chg in renal function: [ x] none, '[ ]'$  Inc. Cr > 0.5, '[ ]'$  Temp. Dialysis, '[ ]'$  Permanent dialysis Leg ischemia: '[ ]'$  No, '[ ]'$  Yes, no Surgery needed, [ x] Yes, Surgery needed, '[ ]'$  Amputation Bowel ischemia: [ x] No, '[ ]'$  Medical Rx, '[ ]'$  Surgical Rx Wound complication: [x ] No, '[ ]'$  Superficial separation/infection, '[ ]'$  Return to OR Return to OR: Yes  Return to OR for bleeding: No Stroke: [x ] None, '[ ]'$  Minor, '[ ]'$  Major  Discharge medications: Statin use:  Yes ASA use:  Yes Plavix use:  No  for medical reason not indicated Beta blocker use:  Yes

## 2021-02-05 NOTE — TOC Transition Note (Signed)
Transition of Care (TOC) - CM/SW Discharge Note Marvetta Gibbons RN, BSN Transitions of Care Unit 4E- RN Case Manager See Treatment Team for direct phone #    Patient Details  Name: Lacey Vega MRN: [id number] Date of Birth: 1950-11-15  Transition of Care [facility]) CM/SW Contact:  Dawayne Patricia, RN Phone Number: 02/05/2021, 11:54 AM   Clinical Narrative:    Pt stable for transition home today, order placed for [facility]- call made to daughter Sharyn Lull to discuss. Explained to daughter what HHOT would focus on as far as therapy- ADLs- and that the visits would not be an extended visit- only about 32mn per visit about 2x week. Per therapy notes today recommendation for intermittent supervision- daughter voiced that she does not feel HH would be needed at this time and family to work out the needed supervision (mainly at night) that is recommended.  Declined HH referral at this time for OT.  Per daughter pt has needed DME.   Daughter to come and transport home.    Final next level of care: Home/Self Care Barriers to Discharge: No Barriers Identified   Patient Goals and CMS Choice Patient states their goals for this hospitalization and ongoing recovery are:: return home CMS Medicare.gov Compare Post Acute Care list provided to:: Patient Represenative (must comment) Choice offered to / list presented to : Adult Children  Discharge Placement               home        Discharge Plan and Services   Discharge Planning Services: CM Consult Post Acute Care Choice: Home Health          DME Arranged: N/A DME Agency: NA       HH Arranged: OT, Patient Refused HGailAgency: NA        Social Determinants of Health (SDOH) Interventions     Readmission Risk Interventions Readmission Risk Prevention Plan 02/05/2021  Post Dischage Appt Complete  Medication Screening Complete  Transportation Screening Complete  Some recent data might be hidden

## 2021-02-05 NOTE — Progress Notes (Signed)
Discharge instructions (including medications) discussed with and copy provided to patient/caregiver 

## 2021-02-05 NOTE — Progress Notes (Signed)
Occupational Therapy Treatment Patient Details Name: Lacey Vega MRN: [id number] DOB: 25-Nov-1950 Today's Date: 02/05/2021    History of present illness Lacey Vega is a 70 y.o. female admitted 9/1 referred to the ER by her primary care physician after outpatient duplex ultrasound revealed large infrarenal abdominal aortic aneurysm.  A CT angiogram was performed in the [facility] emergency department and demonstrated a 75 mm infrarenal abdominal aortic aneurysm with anterior sac irregularity. Had abdominal anerysm repair 9/1.  Pt with complication of thromboembolism to left leg. Left leg thrombectomy emergently on 9/1 after the first surgery. Pt with COVID as well. PMH: Anxiety, asthma, DM, HTN, TIA   OT comments  Pt has significantly increase in ability to complete functional tasks in session due to decrease reports of edema/tightness in BLE. Pt reports they will have support from family 24/7 with the return to home and will complete IADLS for pt. Pt was educated on how to complete BLE dressing due to limited ROM in BLE with use of sock aide, long handle reacher and long handle shoe horn.  Pt currently with functional limitations due to the deficits listed below (see OT Problem List).  Pt will benefit from skilled OT to increase their safety and independence with ADL and functional mobility for ADL to facilitate discharge to venue listed below.    Follow Up Recommendations  Supervision - Intermittent    Equipment Recommendations   (sock aide)    Recommendations for Other Services      Precautions / Restrictions Precautions Precautions: Fall Precaution Comments: COVID+ Restrictions Weight Bearing Restrictions: No       Mobility Bed Mobility Overal bed mobility: Needs Assistance Bed Mobility: Supine to Sit     Supine to sit: Modified independent (Device/Increase time);HOB elevated     General bed mobility comments: increase time and use of bed rail     Transfers Overall transfer level: Needs assistance Equipment used: Rolling walker (2 wheeled) Transfers: Sit to/from Stand Sit to Stand: Min guard;From elevated surface (as pt was able to increase in transfers they required decrease in level of assist)              Balance Overall balance assessment: Needs assistance Sitting-balance support: No upper extremity supported;Feet supported Sitting balance-Leahy Scale: Good     Standing balance support: Bilateral upper extremity supported (pt was able to complete AROM with one use of UE support for reaching task for about 5 in interal) Standing balance-Leahy Scale: Fair                             ADL either performed or assessed with clinical judgement   ADL Overall ADL's : Needs assistance/impaired Eating/Feeding: Independent;Sitting   Grooming: Wash/dry hands;Wash/dry face;Min guard;Cueing for safety;Cueing for sequencing;Sitting;Standing   Upper Body Bathing: Set up;Sitting   Lower Body Bathing: Minimal assistance;Cueing for safety;Cueing for sequencing;Sit to/from stand   Upper Body Dressing : Set up;Cueing for safety;Cueing for sequencing;Sitting   Lower Body Dressing: Minimal assistance;Cueing for safety;Cueing for sequencing;Sit to/from stand   Toilet Transfer: Min guard;Cueing for safety;Cueing for sequencing;BSC;RW   Toileting- Water quality scientist and Hygiene: Minimal assistance;Cueing for safety;Cueing for sequencing;Sit to/from stand       Functional mobility during ADLs: Min guard;Cueing for safety;Rolling walker General ADL Comments: As the pt increase in mobility reported decrease pain in L knee and increase in ability to use     Vision  Perception     Praxis      Cognition Arousal/Alertness: Awake/alert Behavior During Therapy: WFL for tasks assessed/performed Overall Cognitive Status: Within Functional Limits for tasks assessed                                           Exercises General Exercises - Lower Extremity Ankle Circles/Pumps: AROM;5 reps;Seated   Shoulder Instructions       General Comments      Pertinent Vitals/ Pain       Pain Assessment: Faces Faces Pain Scale: Hurts little more Pain Location: L knee Pain Descriptors / Indicators: Aching;Discomfort;Grimacing;Guarding Pain Intervention(s): Limited activity within patient's tolerance  Home Living                                          Prior Functioning/Environment              Frequency  Min 2X/week        Progress Toward Goals  OT Goals(current goals can now be found in the care plan section)  Progress towards OT goals: Progressing toward goals  Acute Rehab OT Goals Patient Stated Goal: go home today OT Goal Formulation: With patient Time For Goal Achievement: 02/16/21 Potential to Achieve Goals: Good ADL Goals Pt Will Perform Upper Body Bathing: with modified independence;sitting Pt Will Perform Lower Body Bathing: with set-up;sit to/from stand Pt Will Transfer to Toilet: with supervision;ambulating;regular height toilet;grab bars Pt Will Perform Tub/Shower Transfer: with supervision;shower seat;ambulating;grab bars  Plan Discharge plan needs to be updated    Co-evaluation                 AM-PAC OT "6 Clicks" Daily Activity     Outcome Measure   Help from another person eating meals?: None Help from another person taking care of personal grooming?: None Help from another person toileting, which includes using toliet, bedpan, or urinal?: A Little Help from another person bathing (including washing, rinsing, drying)?: A Little Help from another person to put on and taking off regular upper body clothing?: A Little Help from another person to put on and taking off regular lower body clothing?: A Little 6 Click Score: 20    End of Session Equipment Utilized During Treatment: Gait belt;Rolling walker  OT Visit Diagnosis:  Unsteadiness on feet (R26.81);Muscle weakness (generalized) (M62.81);Pain   Activity Tolerance No increased pain;Other (comment) (as increase in ambulation had decrease in pain)   Patient Left in chair;with call bell/phone within reach;with chair alarm set   Nurse Communication          Time: 0722-0801 OT Time Calculation (min): 39 min  Charges: OT General Charges $OT Visit: 1 Visit OT Treatments $Self Care/Home Management : 38-52 mins  Joeseph Amor OTR/L  Acute Rehab Services  [phone] office number [phone] pager number    Joeseph Amor 02/05/2021, 8:13 AM

## 2021-02-05 NOTE — Plan of Care (Signed)
  Problem: Acute Rehab PT Goals(only PT should resolve) Goal: Pt Will Go Supine/Side To Sit Outcome: Adequate for Discharge Goal: Patient Will Transfer Sit To/From Stand Outcome: Adequate for Discharge Goal: Pt Will Ambulate Outcome: Adequate for Discharge Goal: Pt Will Go Up/Down Stairs Outcome: Adequate for Discharge

## 2021-02-05 NOTE — Progress Notes (Addendum)
[facility] [place]  Subjective  - feels better, had BM yesterday    Objective (!) 124/50 84 (!) 100.7 F (38.2 C) (Oral) 16 100%  Intake/Output Summary (Last 24 hours) at 02/05/2021 0730 Last data filed at 02/04/2021 2014 Gross per 24 hour  Intake 703.66 ml  Output --  Net 703.66 ml    Abdomin non distended, soft, diffuse minimal tenderness Left LE incisional edema, compartments soft, incision healing well BM with blood present per RN Ambulating Doppler AT right LE warm to touch, motor intact Groins soft NTTP  Assessment/Planning: POD # 5 EVAR/right fem endarterectomy and left LE thrombectomy  D/C heparin , started SQ heparin for dvt prophylaxis ASA and Statin Plan for discharge home today no PT/OT f/u recommended. Pending am after post transfusion labs    [name] 02/05/2021 7:30 AM --  Laboratory Lab Results: Recent Labs    02/04/21 0042  WBC 8.9  HGB 7.0*  HCT 22.3*  PLT 201   BMET No results for input(s): NA, K, CL, CO2, GLUCOSE, BUN, CREATININE, CALCIUM in the last 72 hours.  COAG Lab Results  Component Value Date   INR 1.2 02/01/2021   INR 0.94 03/26/2009   No results found for: PTT  VASCULAR STAFF ADDENDUM: I have independently interviewed and examined the patient. I agree with the above.  Progressing nicely. No abdominal complaints.Tolerating diet with bowel function. No blood per rectum.  Left leg asymptomatic. Good doppler flow. Ambulating without much difficulty.  Looks ready for home as long as Hgb improved.  Follow up with [facility] PA in 2 weeks. Follow up with me in 6 weeks with CT angiogram.  [name], MD [facility] Phone Number: [phone] 02/05/2021 8:09 AM

## 2021-02-05 NOTE — Discharge Instructions (Signed)
   [facility] [place]   Discharge Instructions  Endovascular Aortic Aneurysm Repair  Please refer to the following instructions for your post-procedure care. Your surgeon or Physician Assistant will discuss any changes with you.  Activity  You are encouraged to walk as much as you can. You can slowly return to normal activities but must avoid strenuous activity and heavy lifting until your doctor tells you it's OK. Avoid activities such as vacuuming or swinging a gold club. It is normal to feel tired for several weeks after your surgery. Do not drive until your doctor gives the OK and you are no longer taking prescription pain medications. It is also normal to have difficulty with sleep habits, eating, and bowel movements after surgery. These will go away with time.  Bathing/Showering  You may shower after you go home. If you have an incision, do not soak in a bathtub, hot tub, or swim until the incision heals completely.  Incision Care  Shower every day. Clean your incision with mild soap and water. Pat the area dry with a clean towel. You do not need a bandage unless otherwise instructed. Do not apply any ointments or creams to your incision. If you clothing is irritating, you may cover your incision with a dry gauze pad.  Diet  Resume your normal diet. There are no special food restrictions following this procedure. A low fat/low cholesterol diet is recommended for all patients with vascular disease. In order to heal from your surgery, it is CRITICAL to get adequate nutrition. Your body requires vitamins, minerals, and protein. Vegetables are the best source of vitamins and minerals. Vegetables also provide the perfect balance of protein. Processed food has little nutritional value, so try to avoid this.  Medications  Resume taking all of your medications unless your doctor or nurse practitioner tells you not to. If your incision is causing pain, you may take  over-the-counter pain relievers such as acetaminophen (Tylenol). If you were prescribed a stronger pain medication, please be aware these medications can cause nausea and constipation. Prevent nausea by taking the medication with a snack or meal. Avoid constipation by drinking plenty of fluids and eating foods with a high amount of fiber, such as fruits, vegetables, and grains. Do not take Tylenol if you are taking prescription pain medications.   Follow up  Our office will schedule a follow-up appointment with a C.T. scan 3-4 weeks after your surgery.  Please call us immediately for any of the following conditions  Severe or worsening pain in your legs or feet or in your abdomen back or chest. Increased pain, redness, drainage (pus) from your incision sit. Increased abdominal pain, bloating, nausea, vomiting or persistent diarrhea. Fever of 101 degrees or higher. Swelling in your leg (s),  Reduce your risk of vascular disease  Stop smoking. If you would like help call [facility] at 1-800-QUIT-NOW (1-800-784-8669) or [place] at [phone]. Manage your cholesterol Maintain a desired weight Control your diabetes Keep your blood pressure down  If you have questions, please call the office at [phone].   

## 2021-02-06 LAB — BPAM RBC
Blood Product Expiration Date: 202209152359
Blood Product Expiration Date: 202210022359
Blood Product Expiration Date: 202210022359
Blood Product Expiration Date: 202210052359
ISSUE DATE / TIME: 202209051412
ISSUE DATE / TIME: 202209051709
Unit Type and Rh: 5100
Unit Type and Rh: 5100
Unit Type and Rh: 6200
Unit Type and Rh: 6200

## 2021-02-06 LAB — TYPE AND SCREEN
ABO/RH(D): A POS
Antibody Screen: NEGATIVE
Donor AG Type: NEGATIVE
Donor AG Type: NEGATIVE
Donor AG Type: NEGATIVE
Donor AG Type: NEGATIVE
Unit division: 0
Unit division: 0
Unit division: 0
Unit division: 0

## 2021-02-07 ENCOUNTER — Telehealth: Payer: Self-pay

## 2021-02-07 ENCOUNTER — Other Ambulatory Visit: Payer: Self-pay

## 2021-02-07 DIAGNOSIS — I714 Abdominal aortic aneurysm, without rupture, unspecified: Secondary | ICD-10-CM

## 2021-02-07 NOTE — Telephone Encounter (Signed)
Patient is s/p EVAR with bilateral groin cutdown and popliteal artery thrombectomy on the lower left leg by Dr. [name] on 01/31/21. She is experiencing a lot of swelling and pain in the left leg. Her foot is not cold or painful and she is able to move the leg. Says elevation is not really helping the swelling. Says the incision is not draining, but is swollen and tight. Discussed with PA, placed patient on schedule tomorrow for LLE arterial duplex and ABI with MD follow up.

## 2021-02-08 ENCOUNTER — Ambulatory Visit (INDEPENDENT_AMBULATORY_CARE_PROVIDER_SITE_OTHER): Payer: Medicare HMO | Admitting: Vascular Surgery

## 2021-02-08 ENCOUNTER — Ambulatory Visit (INDEPENDENT_AMBULATORY_CARE_PROVIDER_SITE_OTHER)
Admission: RE | Admit: 2021-02-08 | Discharge: 2021-02-08 | Disposition: A | Discharge status: Home / Self Care | Payer: Medicare HMO | Source: Ambulatory Visit | Attending: Vascular Surgery | Admitting: Vascular Surgery

## 2021-02-08 ENCOUNTER — Other Ambulatory Visit: Payer: Self-pay

## 2021-02-08 ENCOUNTER — Encounter: Payer: Self-pay | Admitting: Vascular Surgery

## 2021-02-08 ENCOUNTER — Ambulatory Visit (HOSPITAL_COMMUNITY)
Admission: RE | Admit: 2021-02-08 | Discharge: 2021-02-08 | Disposition: A | Discharge status: Home / Self Care | Payer: Medicare HMO | Source: Ambulatory Visit | Attending: Vascular Surgery | Admitting: Vascular Surgery

## 2021-02-08 VITALS — BP 135/55 | HR 65 | Temp 97.9°F | Resp 20 | Ht 67.0 in | Wt 180.0 lb

## 2021-02-08 DIAGNOSIS — I714 Abdominal aortic aneurysm, without rupture, unspecified: Secondary | ICD-10-CM

## 2021-02-08 DIAGNOSIS — G8918 Other acute postprocedural pain: Secondary | ICD-10-CM

## 2021-02-08 NOTE — Progress Notes (Signed)
Office Note     CC: Left lower extremity pain Requesting Provider:  Rogers Blocker, MD  HPI: Lacey Vega is a 70 y.o. (13-Aug-1950) female who presents as an urgent follow-up status post EVAR for symptomatic aortic aneurysm, complicated by left lower extremity ischemia, status post femoral/popliteal/ tibial embolectomy on 01/31/2021.  The patient has continued to appreciate pain at home in the left lower extremity, and therefore asked for an urgent clinic visit to discuss her symptoms.  On exam, Lacey Vega was in good spirits.  She complained of pain in the left calf specifically at the incision site of the below-knee popliteal artery cutdown.  She stated her calf feels tense and it has limited her mobility.  The pain was described as waxing and waning but has been so bad it points has been difficult for her to sleep.  This is also limited her ambulation.  She has not been adherent to her discharge medications, only taking Tylenol.  She denies sensorimotor deficits in the foot.   The pt ison a statin for cholesterol management.  The pt is on a daily aspirin.   Other AC:  - The pt is on - for hypertension.   The pt is not diabetic.   Tobacco hx:  former  Past Medical History:  Diagnosis Date   Anemia    Anxiety    Asthma    Asthma    Bronchitis    hx   Colon polyps    hyperplastic   Diabetes (HCC)    mild   Diabetes mellitus    DJD (degenerative joint disease), cervical    Dysphagia    history of   Dyspnea    Gallstones    GERD (gastroesophageal reflux disease)    H/O chest pain    secondary to anemia   Headache    History of arteriovenous malformation (AVM)    History of hiatal hernia    History of tobacco use    Hyperlipemia    Hypertension    Hypothyroidism    Iron deficiency anemia    Sickle cell anemia ([place])    "patient is not aware of this"   Thyroid disease    TIA (transient ischemic attack)    "was told that she possible had one"    Past Surgical  History:  Procedure Laterality Date   ABDOMINAL AORTIC ENDOVASCULAR STENT GRAFT Bilateral 01/31/2021   Procedure: ENDOVASCULAR ANEURYSM REPAIR (EVAR)Bilateral Groin Cutdown, left femoral endaterectomy with bovine patch angioplasty.;  Surgeon: Cherre , MD;  Location: [facility] OR;  Service: Vascular;  Laterality: Bilateral;   ABDOMINAL HYSTERECTOMY     ANTERIOR CERVICAL DECOMP/DISCECTOMY FUSION N/A 10/03/2016   Procedure: ANTERIOR CERVICAL DECOMPRESSION/DISCECTOMY FUSION CERVICAL THREE- CERVICAL FOUR;  Surgeon: Ashok Pall, MD;  Location: [place];  Service: Neurosurgery;  Laterality: N/A;  Right side approach   BOTOX INJECTION  08/29/2015   Procedure: BOTOX INJECTION;  Surgeon: Wilford Corner, MD;  Location: [place];  Service: Endoscopy;;   CHOLECYSTECTOMY     COLONOSCOPY  12/03/2011   Procedure: COLONOSCOPY;  Surgeon: [name], MD;  Location: [facility];  Service: Endoscopy;  Laterality: N/A;   ESOPHAGEAL MANOMETRY N/A 07/13/2017   Procedure: ESOPHAGEAL MANOMETRY (EM);  Surgeon: Wilford Corner, MD;  Location: [facility];  Service: Endoscopy;  Laterality: N/A;   ESOPHAGOGASTRODUODENOSCOPY  12/03/2011   Procedure: ESOPHAGOGASTRODUODENOSCOPY (EGD);  Surgeon: [name], MD;  Location: [facility];  Service: Endoscopy;  Laterality: N/A;   ESOPHAGOGASTRODUODENOSCOPY (EGD) WITH PROPOFOL  N/A 08/29/2015   Procedure: ESOPHAGOGASTRODUODENOSCOPY (EGD) WITH PROPOFOL;  Surgeon: Wilford Corner, MD;  Location: [facility] ENDOSCOPY;  Service: Endoscopy;  Laterality: N/A;   ESOPHAGOGASTRODUODENOSCOPY (EGD) WITH PROPOFOL N/A 05/17/2019   Procedure: ESOPHAGOGASTRODUODENOSCOPY (EGD) WITH PROPOFOL;  Surgeon: Wilford Corner, MD;  Location: [facility];  Service: Endoscopy;  Laterality: N/A;   GIVENS CAPSULE STUDY N/A 08/05/2018   Procedure: GIVENS CAPSULE STUDY;  Surgeon: Wilford Corner, MD;  Location: [place];  Service: Endoscopy;  Laterality: N/A;   HOT HEMOSTASIS  12/03/2011    Procedure: HOT HEMOSTASIS (ARGON PLASMA COAGULATION/BICAP);  Surgeon: [name], MD;  Location: [facility];  Service: Endoscopy;  Laterality: N/A;   NECK SURGERY     ROTATOR CUFF REPAIR     right   ROTATOR CUFF REPAIR Right 2014   THROMBECTOMY FEMORAL ARTERY Left 01/31/2021   Procedure: THROMBECTOMY POPLITEAL  ARTERY;  Surgeon: Cherre , MD;  Location: [facility] OR;  Service: Vascular;  Laterality: Left;   TONSILLECTOMY      Social History   Socioeconomic History   Marital status: Divorced    Spouse name: Not on file   Number of children: 2   Years of education: Not on file   Highest education level: Not on file  Occupational History   Occupation: group leader environmental services    Employer: [employer]    Comment: [employer]  Tobacco Use   Smoking status: Former    Types: Cigarettes    Quit date: 06/02/2000    Years since quitting: 20.7   Smokeless tobacco: Never  Vaping Use   Vaping Use: Never used  Substance and Sexual Activity   Alcohol use: No   Drug use: No   Sexual activity: Not on file  Other Topics Concern   Not on file  Social History Narrative   ** Merged History Encounter **       Social Determinants of Health   Financial Resource Strain: Not on file  Food Insecurity: Not on file  Transportation Needs: Not on file  Physical Activity: Not on file  Stress: Not on file  Social Connections: Not on file  Intimate Partner Violence: Not on file   Family History  Problem Relation Age of Onset   Diabetes Mother    Heart attack Brother    Cancer Maternal Aunt     Current Outpatient Medications  Medication Sig Dispense Refill   albuterol (VENTOLIN HFA) 108 (90 Base) MCG/ACT inhaler Inhale 2 puffs into the lungs every 6 (six) hours as needed for wheezing or shortness of breath.      aspirin EC 81 MG tablet Take 81 mg by mouth daily.     atorvastatin (LIPITOR) 40 MG tablet Take 40 mg by mouth daily.     carisoprodol (SOMA) 250  MG tablet Take 250 mg by mouth 2 (two) times daily as needed (cramps).      CINNAMON PO Take 1 tablet by mouth daily. '1000mg'$      Cyanocobalamin (VITAMIN B 12) 500 MCG TABS Take 1,000 mg by mouth every morning.     docusate sodium (COLACE) 100 MG capsule Take 100 mg by mouth daily.     donepezil (ARICEPT) 5 MG tablet Take 5 mg by mouth at bedtime.     ferrous sulfate ([employer] IRON) 325 (65 FE) MG tablet TAKE 1 TABLET EVERY DAY . PLEASE TAKE 1 PILL DAILY WITH ORANGE JUICE OR 500 UNITS OF VITAMIN C (Patient taking differently: Take 325 mg by mouth  daily with breakfast.) 90 tablet 1   furosemide (LASIX) 20 MG tablet Take 20 mg by mouth daily.      levothyroxine (SYNTHROID) 125 MCG tablet Take 125 mcg by mouth daily before breakfast.     Magnesium Oxide 420 (252 Mg) MG TABS Take 420 mg by mouth daily.     montelukast (SINGULAIR) 10 MG tablet Take 10 mg by mouth daily.       Multiple Vitamins-Minerals (HAIR/SKIN/NAILS/BIOTIN PO) Take 1 tablet by mouth daily.     nitroGLYCERIN (NITROSTAT) 0.4 MG SL tablet Place 0.4 mg under the tongue every 5 (five) minutes as needed for chest pain.     Omega-3 Fatty Acids (FISH OIL BURP-LESS) 1200 MG CAPS Take 2 capsules by mouth daily.     omeprazole (PRILOSEC) 40 MG capsule Take 40 mg by mouth daily.     traMADol (ULTRAM) 50 MG tablet Take 1 tablet (50 mg total) by mouth every 6 (six) hours as needed. 20 tablet 0   No current facility-administered medications for this visit.   Facility-Administered Medications Ordered in Other Visits  Medication Dose Route Frequency Provider Last Rate Last Admin   0.9 %  sodium chloride infusion   Intravenous Continuous Wilford Corner, MD        Allergies  Allergen Reactions   Oxycodone Shortness Of Breath   Oxycodone      REVIEW OF SYSTEMS:   X denotes positive finding, denotes negative finding Cardiac  Comments:  Chest pain or chest pressure:    Shortness of breath upon exertion:    Short of breath when lying flat:     Irregular heart rhythm:        Vascular    Pain in calf, thigh, or hip brought on by ambulation: X   Pain in feet at night that wakes you up from your sleep:     Blood clot in your veins:    Leg swelling:  X  BK pop incision site with post op swelling      Pulmonary    Oxygen at home:    Productive cough:     Wheezing:         Neurologic    Sudden weakness in arms or legs:     Sudden numbness in arms or legs:     Sudden onset of difficulty speaking or slurred speech:    Temporary loss of vision in one eye:     Problems with dizziness:         Gastrointestinal    Blood in stool:     Vomited blood:         Genitourinary    Burning when urinating:     Blood in urine:        Psychiatric    Major depression:         Hematologic    Bleeding problems:    Problems with blood clotting too easily:        Skin    Rashes or ulcers:        Constitutional    Fever or chills:      PHYSICAL EXAMINATION:  Vitals:   02/08/21 0958  BP: (!) 135/55  Pulse: 65  Resp: 20  Temp: 97.9 F (36.6 C)  SpO2: 97%  Weight: 180 lb (81.6 kg)  Height: '5\' 7"'$  (1.702 m)    General:  WDWN in NAD; vital signs documented above Gait: Not observed HENT: WNL, normocephalic Pulmonary: normal non-labored breathing , without Rales, rhonchi,  wheezing Cardiac: regular HR, without  Murmurs without Abdomen: soft, NT, no masses Skin: without rashes Vascular Exam/Pulses:  Right Left  Radial 2+ (normal) 2+ (normal)  Ulnar 2+ (normal) 2+ (normal)  Femoral 2+ (normal) 2+ (normal)  Popliteal - -  DP 1+ (weak) phasic  PT 1+ (weak) absent   Extremities: without ischemic changes, without Gangrene , without cellulitis; without open wounds;   Femoral incisions healing well. BK pop with postop swelling, dermis nonthreatened Musculoskeletal: no muscle wasting or atrophy  Neurologic: A&O X 3;  No focal weakness or paresthesias are detected Psychiatric:  The pt has Normal affect.   Non-Invasive  Vascular Imaging:   Patient's noninvasive vascular imaging was reviewed demonstrating critical SFA stenosis in the left leg attributing to an ABI of 0.28 in that limb.    ASSESSMENT/PLAN:: 70 y.o. female presenting with postoperative pain status post EVAR, left lower extremity revascularization.  Wounds are healing appropriately.  Patient stated the majority of her pain was appreciated when moving which is more consistent with postoperative recovery rather than ischemia.  Discussed her low ankle-brachial index with Dr. Luan Pulling who will be seeing her next in clinic with follow-up CTA.  With her and her daughter asked to call our office if any questions or concerns arose    Broadus John, MD [facility] [phone]

## 2021-02-08 NOTE — Addendum Note (Signed)
Addended by: Orlie Pollen E on: 02/08/2021 04:33 PM   Modules accepted: Level of Service

## 2021-02-14 ENCOUNTER — Telehealth: Payer: Self-pay

## 2021-02-14 NOTE — Telephone Encounter (Signed)
Patient's daughter calls today to report that patient's lower leg has been bleeding some through the stitches. Says she walked more than usual yesterday. There is some numbness in her thighs that has been present and swelling, but no pain in her feet or temperature changes. Advised to keep stitches clean and dry and could cover with gauze to keep blood spots off her clothes. She will follow up next week in clinic with Dr. [name].

## 2021-02-18 ENCOUNTER — Other Ambulatory Visit: Payer: Self-pay | Admitting: *Deleted

## 2021-02-18 DIAGNOSIS — I714 Abdominal aortic aneurysm, without rupture, unspecified: Secondary | ICD-10-CM

## 2021-02-19 ENCOUNTER — Other Ambulatory Visit: Payer: Self-pay | Admitting: *Deleted

## 2021-02-19 ENCOUNTER — Ambulatory Visit (INDEPENDENT_AMBULATORY_CARE_PROVIDER_SITE_OTHER): Payer: Medicare HMO | Admitting: Physician Assistant

## 2021-02-19 ENCOUNTER — Other Ambulatory Visit: Payer: Self-pay

## 2021-02-19 VITALS — BP 128/60 | HR 76 | Temp 97.6°F | Resp 20 | Ht 67.0 in | Wt 182.8 lb

## 2021-02-19 DIAGNOSIS — I714 Abdominal aortic aneurysm, without rupture, unspecified: Secondary | ICD-10-CM

## 2021-02-19 NOTE — Progress Notes (Signed)
POST OPERATIVE OFFICE NOTE    CC:  F/u for surgery  HPI:  This is a 70 y.o. female who is s/p endovascular repair of abdominal aortic aneurysm which was complicated by left lower extremity ischemia requiring common femoral artery endarterectomy with femoral, popliteal, tibial thrombectomy by Dr. Stanford Vega on 01/31/2021.  She was seen in the office on 02/08/2021 by Dr. Virl Vega due to postoperative pain.  Since that time she states that her mobility has improved.  She does have hip and buttock claudication bilaterally however she denies rest pain or tissue loss of bilateral lower extremities.  Groin incisions have healed.  She states she has noticed some drainage from her left popliteal incision over the past week which has been thin and bloody in nature.  She denies any fevers, chills, nausea/vomiting.  She also denies any new or changing abdominal or back pain.  She has not yet had her CTA abdomen and pelvis postoperatively.  Allergies  Allergen Reactions   Oxycodone Shortness Of Breath   Oxycodone     Current Outpatient Medications  Medication Sig Dispense Refill   albuterol (VENTOLIN HFA) 108 (90 Base) MCG/ACT inhaler Inhale 2 puffs into the lungs every 6 (six) hours as needed for wheezing or shortness of breath.      aspirin EC 81 MG tablet Take 81 mg by mouth daily.     atorvastatin (LIPITOR) 40 MG tablet Take 40 mg by mouth daily.     carisoprodol (SOMA) 250 MG tablet Take 250 mg by mouth 2 (two) times daily as needed (cramps).      CINNAMON PO Take 1 tablet by mouth daily. 1000mg      Cyanocobalamin (VITAMIN B 12) 500 MCG TABS Take 1,000 mg by mouth every morning.     docusate sodium (COLACE) 100 MG capsule Take 100 mg by mouth daily.     donepezil (ARICEPT) 5 MG tablet Take 5 mg by mouth at bedtime.     ferrous sulfate ([employer] IRON) 325 (65 FE) MG tablet TAKE 1 TABLET EVERY DAY . PLEASE TAKE 1 PILL DAILY WITH ORANGE JUICE OR 500 UNITS OF VITAMIN C (Patient taking differently: Take 325 mg by  mouth daily with breakfast.) 90 tablet 1   furosemide (LASIX) 20 MG tablet Take 20 mg by mouth daily.      levothyroxine (SYNTHROID) 125 MCG tablet Take 125 mcg by mouth daily before breakfast.     Magnesium Oxide 420 (252 Mg) MG TABS Take 420 mg by mouth daily.     montelukast (SINGULAIR) 10 MG tablet Take 10 mg by mouth daily.       Multiple Vitamins-Minerals (HAIR/SKIN/NAILS/BIOTIN PO) Take 1 tablet by mouth daily.     nitroGLYCERIN (NITROSTAT) 0.4 MG SL tablet Place 0.4 mg under the tongue every 5 (five) minutes as needed for chest pain.     Omega-3 Fatty Acids (FISH OIL BURP-LESS) 1200 MG CAPS Take 2 capsules by mouth daily.     omeprazole (PRILOSEC) 40 MG capsule Take 40 mg by mouth daily.     traMADol (ULTRAM) 50 MG tablet Take 1 tablet (50 mg total) by mouth every 6 (six) hours as needed. 20 tablet 0   No current facility-administered medications for this visit.   Facility-Administered Medications Ordered in Other Visits  Medication Dose Route Frequency Provider Last Rate Last Admin   0.9 %  sodium chloride infusion   Intravenous Continuous Wilford Corner, MD         ROS:  See HPI  Physical  Exam:  Vitals:   02/19/21 0854  BP: 128/60  Pulse: 76  Resp: 20  Temp: 97.6 F (36.4 C)  TempSrc: Temporal  SpO2: 98%  Weight: 182 lb 12.8 oz (82.9 kg)  Height: 5\' 7"  (1.702 m)    Incision: Groin incisions healed; very minimal dehiscence of distal popliteal incision with thin serosanguineous drainage with manipulation.  No fluctuance or firm hematoma and calf; brisk AT and peroneal signal of left lower extremity brisk AT and PT signal of right lower extremity Neuro: A&O Abdomen:  soft, NT, ND  Assessment/Plan:  This is a 70 y.o. female who is s/p: EVAR complicated by left lower extremity ischemia requiring femoral endarterectomy and left lower extremity thrombectomy  -Bilateral lower extremities are well-perfused based on Doppler exam; hip and buttock claudication is expected  given coiling/covering of bilateral internal iliac arteries. -Wash popliteal incision with soap and water at least twice daily and pat to dry.  Can cover with a dry dressing to prevent soiling clothes and furniture. -Continue to walk and increase mobility -Patient will be scheduled in 1 month for a CTA abdomen pelvis and runoff to further evaluate left SFA lesion seen on arterial duplex postoperatively.  She will follow-up with Dr. Stanford Vega to review the results of CTA.   Lacey Ligas, [facility] [phone]  Clinic MD: Patient was also evaluated by Dr. Stanford Vega today

## 2021-02-25 ENCOUNTER — Telehealth: Payer: Self-pay

## 2021-02-25 NOTE — Telephone Encounter (Signed)
Patient is s/p EVAR and subsequent thrombectomy for ischemia on 01/31/21. Calls to report increased swelling and pain in lle over the last 2-3 days. Mostly hurts in her calf. Also received vm from Dr. Marlou Sa recommending patient be seen sooner. Discussed with PA, added patient on for ABI and visit.

## 2021-02-26 ENCOUNTER — Ambulatory Visit (INDEPENDENT_AMBULATORY_CARE_PROVIDER_SITE_OTHER): Payer: Medicare HMO | Admitting: Vascular Surgery

## 2021-02-26 ENCOUNTER — Other Ambulatory Visit: Payer: Self-pay

## 2021-02-26 ENCOUNTER — Ambulatory Visit (HOSPITAL_COMMUNITY)
Admission: RE | Admit: 2021-02-26 | Discharge: 2021-02-26 | Disposition: A | Discharge status: Home / Self Care | Payer: Medicare HMO | Source: Ambulatory Visit | Attending: Vascular Surgery | Admitting: Vascular Surgery

## 2021-02-26 ENCOUNTER — Encounter (HOSPITAL_COMMUNITY): Payer: Medicare HMO

## 2021-02-26 ENCOUNTER — Encounter: Payer: Self-pay | Admitting: Vascular Surgery

## 2021-02-26 ENCOUNTER — Ambulatory Visit: Payer: Medicare HMO | Admitting: Vascular Surgery

## 2021-02-26 VITALS — BP 99/62 | HR 92 | Temp 98.0°F | Resp 20 | Ht 67.0 in | Wt 182.0 lb

## 2021-02-26 DIAGNOSIS — G8918 Other acute postprocedural pain: Secondary | ICD-10-CM

## 2021-02-26 DIAGNOSIS — Z8679 Personal history of other diseases of the circulatory system: Secondary | ICD-10-CM

## 2021-02-26 DIAGNOSIS — I739 Peripheral vascular disease, unspecified: Secondary | ICD-10-CM

## 2021-02-26 DIAGNOSIS — Z9889 Other specified postprocedural states: Secondary | ICD-10-CM | POA: Diagnosis not present

## 2021-02-26 NOTE — Progress Notes (Signed)
[facility] [place] PROGRESS NOTE  ASSESSMENT / PLAN: Lacey Vega is a 70 y.o. female with left lower extremity swelling and superificial wound breakdown in left lower calf incision. Will order home health to assist with local care to the calf. Continue compression as able. Follow up with me as scheduled with CT angiogram to evaluate the aneurysm repair. Thankfully, her ABI continues to improve with conservative therapy (I.e. walking).  SUBJECTIVE: Returns to clinic for evaluation.  Patient reports worsening appearance of left calf wound and persistent swelling in the left leg.  OBJECTIVE: BP 99/62 (BP Location: Left Arm, Patient Position: Sitting, Cuff Size: Normal)   Pulse 92   Temp 98 F (36.7 C)   Resp 20   Ht 5\' 7"  (1.702 m)   Wt 182 lb (82.6 kg)   SpO2 98%   BMI 28.51 kg/m  No acute distress Regular rate and rhythm Unlabored breathing Soft abdomen Left calf incision with mild eschar.  Fibrinous exudate at the caudal most aspect.  No surrounding cellulitis or purulent drainage  CBC Latest Ref Rng & Units 02/05/2021 02/04/2021 02/01/2021  WBC 4.0 - 10.5 K/uL 9.5 8.9 9.9  Hemoglobin 12.0 - 15.0 g/dL 9.5(L) 7.0(L) 8.6(L)  Hematocrit 36.0 - 46.0 % 29.2(L) 22.3(L) 27.7(L)  Platelets 150 - 400 K/uL 248 201 242     CMP Latest Ref Rng & Units 02/01/2021 02/01/2021 01/31/2021  Glucose 70 - 99 mg/dL 158(H) - -  BUN 8 - 23 mg/dL 13 - -  Creatinine 0.44 - 1.00 mg/dL 1.11(H) - -  Sodium 135 - 145 mmol/L 136 142 141  Potassium 3.5 - 5.1 mmol/L 4.6 4.5 5.0  Chloride 98 - 111 mmol/L 106 - -  CO2 22 - 32 mmol/L 23 - -  Calcium 8.9 - 10.3 mg/dL 8.4(L) - -  Total Protein 6.5 - 8.1 g/dL - - -  Total Bilirubin 0.3 - 1.2 mg/dL - - -  Alkaline Phos 38 - 126 U/L - - -  AST 15 - 41 U/L - - -  ALT 0 - 44 U/L - - -   LOWER EXTREMITY DOPPLER STUDY   Patient Name:  Lacey Vega  Date of Exam:   02/26/2021  Medical Rec #: [id number]             Accession #:     [id number]  Date of Birth: 19-Apr-1951            Patient Gender: F  Patient Age:   25 years  Exam Location:  [facility]  Procedure:      VAS [place] ABI WITH/WO TBI  Referring Phys: Jamelle Haring    ---------------------------------------------------------------------------  -----     Other Factors: Worsening left leg pain x 2-3 days                    02/19/21 Visit with office PA: s/p EVAR with LLE ischemia                 requiring femoral endarterectomy. Doppler exam in office  02/19/21                 well perfused bilateral lower extremities.   Comparison Study: 02/08/21   Performing Technologist: June Leap RDMS, RVT      Examination Guidelines: A complete evaluation includes at minimum, Doppler  waveform signals and systolic blood pressure reading at the level of  bilateral  brachial, anterior tibial, and posterior tibial  arteries, when vessel  segments  are accessible. Bilateral testing is considered an integral part of a  complete  examination. Photoelectric Plethysmograph (PPG) waveforms and toe systolic  pressure readings are included as required and additional duplex testing  as  needed. Limited examinations for reoccurring indications may be performed  as  noted.      ABI Findings:  +---------+------------------+-----+---------+--------+  Right    Rt Pressure (mmHg)IndexWaveform Comment   +---------+------------------+-----+---------+--------+  Brachial 120                                       +---------+------------------+-----+---------+--------+  PTA      127               1.06 triphasic          +---------+------------------+-----+---------+--------+  DP       113               0.94 triphasic          +---------+------------------+-----+---------+--------+  Great Toe65                0.54 Abnormal           +---------+------------------+-----+---------+--------+    +---------+------------------+-----+----------+----------------+  Left     Lt Pressure (mmHg)IndexWaveform  Comment           +---------+------------------+-----+----------+----------------+  Brachial                                  unable to obtain  +---------+------------------+-----+----------+----------------+  PTA      57                0.48 monophasic                  +---------+------------------+-----+----------+----------------+  DP       53                0.44 monophasic                  +---------+------------------+-----+----------+----------------+  Great Toe23                0.19 Abnormal                    +---------+------------------+-----+----------+----------------+   +-------+-----------+-----------+------------+------------+  ABI/TBIToday's ABIToday's TBIPrevious ABIPrevious TBI  +-------+-----------+-----------+------------+------------+  Right  1.05       0.54       0.92        0.56          +-------+-----------+-----------+------------+------------+  Left   0.48       0.19       0.28        0.38          +-------+-----------+-----------+------------+------------+      Bilateral ABIs appear increased compared to prior study on 02/08/21.     Summary:  Right: Resting right ankle-brachial index is within normal range. No  evidence of significant right lower extremity arterial disease. The right  toe-brachial index is abnormal.   Left: Resting left ankle-brachial index indicates severe left lower  extremity arterial disease. The left toe-brachial index is abnormal.       *See table(s) above for measurements and observations.     [name], MD [facility] Phone Number: [phone] 02/26/2021 10:54 AM

## 2021-02-28 ENCOUNTER — Ambulatory Visit (HOSPITAL_COMMUNITY): Payer: Medicare HMO

## 2021-03-10 ENCOUNTER — Other Ambulatory Visit: Payer: Self-pay | Admitting: Hematology and Oncology

## 2021-03-11 ENCOUNTER — Encounter: Payer: Self-pay | Admitting: Hematology and Oncology

## 2021-03-14 ENCOUNTER — Ambulatory Visit (HOSPITAL_COMMUNITY)
Admission: RE | Admit: 2021-03-14 | Discharge: 2021-03-14 | Disposition: A | Discharge status: Home / Self Care | Payer: Medicare HMO | Source: Ambulatory Visit | Attending: Vascular Surgery | Admitting: Vascular Surgery

## 2021-03-14 ENCOUNTER — Other Ambulatory Visit: Payer: Self-pay

## 2021-03-14 DIAGNOSIS — I714 Abdominal aortic aneurysm, without rupture, unspecified: Secondary | ICD-10-CM

## 2021-03-14 DIAGNOSIS — Z9889 Other specified postprocedural states: Secondary | ICD-10-CM | POA: Diagnosis present

## 2021-03-14 DIAGNOSIS — Z8679 Personal history of other diseases of the circulatory system: Secondary | ICD-10-CM | POA: Diagnosis present

## 2021-03-14 DIAGNOSIS — I739 Peripheral vascular disease, unspecified: Secondary | ICD-10-CM

## 2021-03-14 LAB — POCT I-STAT CREATININE: Creatinine, Ser: 1 mg/dL (ref 0.44–1.00)

## 2021-03-14 MED ORDER — IOHEXOL 350 MG/ML SOLN
100.0000 mL | Freq: Once | INTRAVENOUS | Status: AC | PRN
  Administered 2021-03-14: 100 mL via INTRAVENOUS

## 2021-03-17 NOTE — H&P (View-Only) (Signed)
[facility]  PROGRESS NOTE  ASSESSMENT / PLAN: Lacey Vega is a 70 y.o. female with left lower extremity swelling and superificial wound breakdown in left lower calf incision. Surveillance CT angiogram shows a focal dissection or residual thrombus in the left superficial femoral artery. Given her wound problems in the calf, I recommend angiography via left brachial approach to optimize flow to her wound. She is amenable.   SUBJECTIVE: Throbbing pain in left calf.  She is ambulatory.  She still describes buttock claudication.  Calf wound is about the same  OBJECTIVE: There were no vitals taken for this visit. No acute distress Regular rate and rhythm Unlabored breathing Soft abdomen Left calf incision appears a bit worse today.  There is some ulceration in the middle.  The cranial and caudal aspects of the wound appear to be healing. no surrounding cellulitis or purulent drainage  CBC Latest Ref Rng & Units 02/05/2021 02/04/2021 02/01/2021  WBC 4.0 - 10.5 K/uL 9.5 8.9 9.9  Hemoglobin 12.0 - 15.0 g/dL 9.5(L) 7.0(L) 8.6(L)  Hematocrit 36.0 - 46.0 % 29.2(L) 22.3(L) 27.7(L)  Platelets 150 - 400 K/uL 248 201 242     CMP Latest Ref Rng & Units 03/14/2021 02/01/2021 02/01/2021  Glucose 70 - 99 mg/dL - 158(H) -  BUN 8 - 23 mg/dL - 13 -  Creatinine 0.44 - 1.00 mg/dL 1.00 1.11(H) -  Sodium 135 - 145 mmol/L - 136 142  Potassium 3.5 - 5.1 mmol/L - 4.6 4.5  Chloride 98 - 111 mmol/L - 106 -  CO2 22 - 32 mmol/L - 23 -  Calcium 8.9 - 10.3 mg/dL - 8.4(L) -  Total Protein 6.5 - 8.1 g/dL - - -  Total Bilirubin 0.3 - 1.2 mg/dL - - -  Alkaline Phos 38 - 126 U/L - - -  AST 15 - 41 U/L - - -  ALT 0 - 44 U/L - - -   CT angiogram reviewed in detail. The aneurysm appears excluded with EVAR. There is no evidence of type I or III endoleak. There is a focal stenosis at the proximal left SFA which is either residual embolized thrombus or dissection flap. There is flow noted in  the branches of the left hypogastric artery, likely from collateral flow from the profundas.   [name], MD [facility] Phone Number: [phone] 03/17/2021 3:55 PM

## 2021-03-17 NOTE — Progress Notes (Signed)
[facility] [place] PROGRESS NOTE  ASSESSMENT / PLAN: Lacey Vega is a 70 y.o. female with left lower extremity swelling and superificial wound breakdown in left lower calf incision. Surveillance CT angiogram shows a focal dissection or residual thrombus in the left superficial femoral artery. Given her wound problems in the calf, I recommend angiography via left brachial approach to optimize flow to her wound. She is amenable.   SUBJECTIVE: Throbbing pain in left calf.  She is ambulatory.  She still describes buttock claudication.  Calf wound is about the same  OBJECTIVE: There were no vitals taken for this visit. No acute distress Regular rate and rhythm Unlabored breathing Soft abdomen Left calf incision appears a bit worse today.  There is some ulceration in the middle.  The cranial and caudal aspects of the wound appear to be healing. no surrounding cellulitis or purulent drainage  CBC Latest Ref Rng & Units 02/05/2021 02/04/2021 02/01/2021  WBC 4.0 - 10.5 K/uL 9.5 8.9 9.9  Hemoglobin 12.0 - 15.0 g/dL 9.5(L) 7.0(L) 8.6(L)  Hematocrit 36.0 - 46.0 % 29.2(L) 22.3(L) 27.7(L)  Platelets 150 - 400 K/uL 248 201 242     CMP Latest Ref Rng & Units 03/14/2021 02/01/2021 02/01/2021  Glucose 70 - 99 mg/dL - 158(H) -  BUN 8 - 23 mg/dL - 13 -  Creatinine 0.44 - 1.00 mg/dL 1.00 1.11(H) -  Sodium 135 - 145 mmol/L - 136 142  Potassium 3.5 - 5.1 mmol/L - 4.6 4.5  Chloride 98 - 111 mmol/L - 106 -  CO2 22 - 32 mmol/L - 23 -  Calcium 8.9 - 10.3 mg/dL - 8.4(L) -  Total Protein 6.5 - 8.1 g/dL - - -  Total Bilirubin 0.3 - 1.2 mg/dL - - -  Alkaline Phos 38 - 126 U/L - - -  AST 15 - 41 U/L - - -  ALT 0 - 44 U/L - - -   CT angiogram reviewed in detail. The aneurysm appears excluded with EVAR. There is no evidence of type I or III endoleak. There is a focal stenosis at the proximal left SFA which is either residual embolized thrombus or dissection flap. There is flow noted in  the branches of the left hypogastric artery, likely from collateral flow from the profundas.   Lacey Vega. [name], MD [facility] Phone Number: [phone] 03/17/2021 3:55 PM

## 2021-03-19 ENCOUNTER — Ambulatory Visit (INDEPENDENT_AMBULATORY_CARE_PROVIDER_SITE_OTHER): Payer: Self-pay | Admitting: Vascular Surgery

## 2021-03-19 ENCOUNTER — Encounter: Payer: Self-pay | Admitting: Vascular Surgery

## 2021-03-19 ENCOUNTER — Other Ambulatory Visit: Payer: Self-pay

## 2021-03-19 VITALS — BP 106/68 | HR 100 | Temp 98.0°F | Resp 20 | Ht 67.0 in | Wt 182.0 lb

## 2021-03-19 DIAGNOSIS — I739 Peripheral vascular disease, unspecified: Secondary | ICD-10-CM

## 2021-03-19 MED ORDER — TRAMADOL HCL 50 MG PO TABS: 50.0000 mg | ORAL_TABLET | Freq: Four times a day (QID) | ORAL | 0 refills | Status: DC | PRN

## 2021-03-26 ENCOUNTER — Ambulatory Visit: Payer: Medicare HMO | Admitting: Vascular Surgery

## 2021-03-29 ENCOUNTER — Other Ambulatory Visit: Payer: Self-pay

## 2021-03-29 ENCOUNTER — Encounter (HOSPITAL_COMMUNITY)
Admission: RE | Disposition: A | Discharge status: Home / Self Care | Payer: Self-pay | Source: Home / Self Care | Attending: Vascular Surgery

## 2021-03-29 ENCOUNTER — Ambulatory Visit (HOSPITAL_COMMUNITY)
Admission: RE | Admit: 2021-03-29 | Discharge: 2021-03-29 | Disposition: A | Discharge status: Home / Self Care | Payer: Medicare HMO | Attending: Vascular Surgery | Admitting: Vascular Surgery

## 2021-03-29 DIAGNOSIS — M79662 Pain in left lower leg: Secondary | ICD-10-CM | POA: Diagnosis not present

## 2021-03-29 DIAGNOSIS — Z95828 Presence of other vascular implants and grafts: Secondary | ICD-10-CM | POA: Diagnosis not present

## 2021-03-29 DIAGNOSIS — I70222 Atherosclerosis of native arteries of extremities with rest pain, left leg: Secondary | ICD-10-CM | POA: Diagnosis not present

## 2021-03-29 DIAGNOSIS — I7143 Infrarenal abdominal aortic aneurysm, without rupture: Secondary | ICD-10-CM | POA: Insufficient documentation

## 2021-03-29 DIAGNOSIS — I70212 Atherosclerosis of native arteries of extremities with intermittent claudication, left leg: Secondary | ICD-10-CM | POA: Diagnosis present

## 2021-03-29 DIAGNOSIS — I739 Peripheral vascular disease, unspecified: Secondary | ICD-10-CM

## 2021-03-29 HISTORY — PX: PERIPHERAL VASCULAR INTERVENTION: CATH118257

## 2021-03-29 LAB — POCT ACTIVATED CLOTTING TIME
Activated Clotting Time: 231 seconds
Activated Clotting Time: 196 seconds
Activated Clotting Time: 184 seconds
Activated Clotting Time: 173 seconds

## 2021-03-29 LAB — POCT I-STAT, CHEM 8
BUN: 14 mg/dL (ref 8–23)
Calcium, Ion: 1.2 mmol/L (ref 1.15–1.40)
Chloride: 102 mmol/L (ref 98–111)
Creatinine, Ser: 1.2 mg/dL — ABNORMAL HIGH (ref 0.44–1.00)
Glucose, Bld: 113 mg/dL — ABNORMAL HIGH (ref 70–99)
HCT: 26 % — ABNORMAL LOW (ref 36.0–46.0)
Hemoglobin: 8.8 g/dL — ABNORMAL LOW (ref 12.0–15.0)
Potassium: 3.9 mmol/L (ref 3.5–5.1)
Sodium: 141 mmol/L (ref 135–145)
TCO2: 28 mmol/L (ref 22–32)

## 2021-03-29 SURGERY — PERIPHERAL VASCULAR INTERVENTION
Anesthesia: LOCAL

## 2021-03-29 MED ORDER — HEPARIN SODIUM (PORCINE) 1000 UNIT/ML IJ SOLN
INTRAMUSCULAR | Status: DC | PRN
  Administered 2021-03-29: 8000 [IU] via INTRAVENOUS
  Administered 2021-03-29: 2000 [IU] via INTRAVENOUS

## 2021-03-29 MED ORDER — LABETALOL HCL 5 MG/ML IV SOLN
10.0000 mg | INTRAVENOUS | Status: DC | PRN
Start: 2021-03-29 — End: 2021-03-29

## 2021-03-29 MED ORDER — NITROGLYCERIN 1 MG/10 ML FOR IR/CATH LAB
INTRA_ARTERIAL | Status: AC
  Filled 2021-03-29: qty 10

## 2021-03-29 MED ORDER — HEPARIN (PORCINE) IN NACL 1000-0.9 UT/500ML-% IV SOLN
INTRAVENOUS | Status: DC | PRN
  Administered 2021-03-29 (×2): 500 mL

## 2021-03-29 MED ORDER — LIDOCAINE HCL (PF) 1 % IJ SOLN
INTRAMUSCULAR | Status: AC
  Filled 2021-03-29: qty 30

## 2021-03-29 MED ORDER — CLOPIDOGREL BISULFATE 300 MG PO TABS
ORAL_TABLET | ORAL | Status: DC | PRN
  Administered 2021-03-29: 300 mg via ORAL

## 2021-03-29 MED ORDER — IODIXANOL 320 MG/ML IV SOLN
INTRAVENOUS | Status: DC | PRN
  Administered 2021-03-29: 45 mL via INTRA_ARTERIAL

## 2021-03-29 MED ORDER — MIDAZOLAM HCL 2 MG/2ML IJ SOLN
INTRAMUSCULAR | Status: DC | PRN
  Administered 2021-03-29 (×2): 1 mg via INTRAVENOUS

## 2021-03-29 MED ORDER — CLOPIDOGREL BISULFATE 75 MG PO TABS: 300.0000 mg | ORAL_TABLET | Freq: Once | ORAL | Status: DC

## 2021-03-29 MED ORDER — LIDOCAINE HCL (PF) 1 % IJ SOLN
INTRAMUSCULAR | Status: DC | PRN
  Administered 2021-03-29: 3 mL

## 2021-03-29 MED ORDER — ACETAMINOPHEN 325 MG PO TABS: 650.0000 mg | ORAL_TABLET | ORAL | Status: DC | PRN

## 2021-03-29 MED ORDER — VERAPAMIL HCL 2.5 MG/ML IV SOLN: INTRA_ARTERIAL | Status: DC | PRN

## 2021-03-29 MED ORDER — NITROGLYCERIN 1 MG/10 ML FOR IR/CATH LAB
INTRA_ARTERIAL | Status: DC | PRN
  Administered 2021-03-29: 200 ug

## 2021-03-29 MED ORDER — SODIUM CHLORIDE 0.9 % WEIGHT BASED INFUSION: 1.0000 mL/kg/h | INTRAVENOUS | Status: DC

## 2021-03-29 MED ORDER — FENTANYL CITRATE (PF) 100 MCG/2ML IJ SOLN
INTRAMUSCULAR | Status: DC | PRN
  Administered 2021-03-29 (×2): 50 ug via INTRAVENOUS

## 2021-03-29 MED ORDER — CLOPIDOGREL BISULFATE 300 MG PO TABS
ORAL_TABLET | ORAL | Status: AC
  Filled 2021-03-29: qty 1

## 2021-03-29 MED ORDER — ONDANSETRON HCL 4 MG/2ML IJ SOLN: 4.0000 mg | Freq: Four times a day (QID) | INTRAMUSCULAR | Status: DC | PRN

## 2021-03-29 MED ORDER — HYDRALAZINE HCL 20 MG/ML IJ SOLN: 5.0000 mg | INTRAMUSCULAR | Status: DC | PRN

## 2021-03-29 MED ORDER — FENTANYL CITRATE (PF) 100 MCG/2ML IJ SOLN
INTRAMUSCULAR | Status: AC
  Filled 2021-03-29: qty 2

## 2021-03-29 MED ORDER — HEPARIN (PORCINE) IN NACL 1000-0.9 UT/500ML-% IV SOLN
INTRAVENOUS | Status: AC
  Filled 2021-03-29: qty 1000

## 2021-03-29 MED ORDER — SODIUM CHLORIDE 0.9 % IV SOLN: INTRAVENOUS | Status: DC

## 2021-03-29 MED ORDER — NITROGLYCERIN 1 MG/10 ML FOR IR/CATH LAB: INTRA_ARTERIAL | Status: DC | PRN

## 2021-03-29 MED ORDER — HEPARIN SODIUM (PORCINE) 1000 UNIT/ML IJ SOLN
INTRAMUSCULAR | Status: AC
  Filled 2021-03-29: qty 1

## 2021-03-29 MED ORDER — CLOPIDOGREL BISULFATE 75 MG PO TABS: 75.0000 mg | ORAL_TABLET | Freq: Every day | ORAL | Status: DC

## 2021-03-29 MED ORDER — SODIUM CHLORIDE 0.9% FLUSH: 3.0000 mL | INTRAVENOUS | Status: DC | PRN

## 2021-03-29 MED ORDER — CLOPIDOGREL BISULFATE 75 MG PO TABS: 75.0000 mg | ORAL_TABLET | Freq: Every day | ORAL | 11 refills | Status: DC

## 2021-03-29 MED ORDER — SODIUM CHLORIDE 0.9 % IV SOLN: 250.0000 mL | INTRAVENOUS | Status: DC | PRN

## 2021-03-29 MED ORDER — MIDAZOLAM HCL 2 MG/2ML IJ SOLN
INTRAMUSCULAR | Status: AC
  Filled 2021-03-29: qty 2

## 2021-03-29 MED ORDER — SODIUM CHLORIDE 0.9% FLUSH: 3.0000 mL | Freq: Two times a day (BID) | INTRAVENOUS | Status: DC

## 2021-03-29 SURGICAL SUPPLY — 16 items
BALLN MUSTANG 6.0X40 135 (BALLOONS) ×3
BALLOON MUSTANG 6.0X40 135 (BALLOONS) ×1 IMPLANT
CATH ANGIO 5F BER2 100CM (CATHETERS) ×2 IMPLANT
DEVICE CONTINUOUS FLUSH (MISCELLANEOUS) ×2 IMPLANT
GLIDEWIRE ADV .035X260CM (WIRE) ×2 IMPLANT
KIT ENCORE 26 ADVANTAGE (KITS) ×2 IMPLANT
KIT MICROPUNCTURE NIT STIFF (SHEATH) ×2 IMPLANT
KIT PV (KITS) ×4 IMPLANT
SHEATH PINNACLE 5F 10CM (SHEATH) ×2 IMPLANT
SHEATH PINNACLE 6F 10CM (SHEATH) ×2 IMPLANT
SHEATH PROBE COVER 6X72 (BAG) ×2 IMPLANT
SHEATH SHUTTLE SELECT 6F (SHEATH) ×2 IMPLANT
STENT ELUVIA 7X40X130 (Permanent Stent) ×2 IMPLANT
TRAY PV CATH (CUSTOM PROCEDURE TRAY) ×4 IMPLANT
WIRE BENTSON .035X145CM (WIRE) ×2 IMPLANT
WIRE HI TORQ VERSACORE 300 (WIRE) ×2 IMPLANT

## 2021-03-29 NOTE — Progress Notes (Signed)
86fr sheath aspirated and removed from left brachial artery. Manual pressure applied for 30 minutes. Site level 0, no s+s of hematoma. Tegaderm dressing applied, bedrest instructions given.   Armboard placed under left elbow for support during bedrest.   Left radial pulse palpable.  Spo2 95-98% as measured in left thumb, was present during manual hold.   Bedrest begins at 12:06:00

## 2021-03-29 NOTE — Interval H&P Note (Signed)
History and Physical Interval Note:  03/29/2021 8:44 AM  Lacey Vega  has presented today for surgery, with the diagnosis of PAD.  The various methods of treatment have been discussed with the patient and family. After consideration of risks, benefits and other options for treatment, the patient has consented to  Procedure(s) with comments: PERIPHERAL VASCULAR INTERVENTION - Lt SFA Brachial Approach as a surgical intervention.  The patient's history has been reviewed, patient examined, no change in status, stable for surgery.  I have reviewed the patient's chart and labs.  Questions were answered to the patient's satisfaction.     Cherre Robins

## 2021-03-29 NOTE — Op Note (Signed)
DATE OF SERVICE: 03/29/2021  PATIENT:  Lacey Vega  70 y.o. 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, MD  ASSISTANT: none  ANESTHESIA:   local and IV sedation  ESTIMATED BLOOD LOSS: min  LOCAL MEDICATIONS USED:  LIDOCAINE   COUNTS: confirmed correct.  PATIENT DISPOSITION:  PACU - hemodynamically stable.   Delay start of Pharmacological VTE agent (>24hrs) due to surgical blood loss or risk of bleeding: no  INDICATION FOR PROCEDURE: Lacey Vega is a 70 y.o. female with complex recent vascular history.  On 9 1/22 patient underwent urgent EVAR for large infrarenal abdominal aortic aneurysm.  This was complicated by need to extend repair into the external iliac artery bilaterally as well as need to cut down on the left common femoral artery because of Perclose failure.  The patient developed an ischemic limb shortly after the intervention and return to the operating room the early morning of 02/01/2021 for thrombectomy.  This restored Doppler flow to her left foot.  The patient return to clinic with claudication symptoms in her left leg.  While her ABI improved, the calf incision from her popliteal thrombectomy was not healing well. CT angiogram revealed flow limiting lesion in the proximal left superficial femoral artery. After careful discussion of risks, benefits, and alternatives the patient was offered angiography with intervention.  The patient understood and wished to proceed.  OPERATIVE FINDINGS:   Left lower extremity:  External iliac artery: Patent distal to EVAR graft. Common femoral artery: Widely patent  without flow-limiting stenosis Profunda femoris artery: Widely patent without flow-limiting stenosis  Superficial femoral artery: Severe stenosis about the proximal superficial femoral artery, remainder of artery widely patent Popliteal artery: Widely patent without flow-limiting stenosis Anterior tibial artery: Widely patent without flow-limiting stenosis Tibioperoneal trunk: Widely patent without flow-limiting stenosis Peroneal artery: Widely patent without flow-limiting stenosis Posterior tibial artery: Widely patent without flow-limiting stenosis  DESCRIPTION OF PROCEDURE: After identification of the patient in the pre-operative holding area, the patient was transferred to the operating room. The patient was positioned supine on the operating room table. Anesthesia was induced. The groins was prepped and draped in standard fashion. A surgical pause was performed confirming correct patient, procedure, and operative location.  The left antecubital fossa was anesthetized with subcutaneous injection of 1% lidocaine. Using ultrasound guidance, the left brachial artery was accessed with micropuncture technique. Fluoroscopy was used to confirm cannulation over the femoral head. The 56F sheath was upsized to 27F.   A Benson wire was advanced into the subclavian artery.  A Glidewire advantage was advanced into the subclavian artery.  The patient was systemically heparinized.  The wire and catheter were delivered into the ascending aorta and used to select the descending aorta.  The wire was navigated into the left limb of the EVAR device.  The catheter was withdrawn.  The 5 French sheath was exchanged for a 6 [place] by 90 cm sheath into the left external iliac artery.  An angiogram was performed over the left femoral head and identified the proximal superficial femoral artery lesion previously demonstrated on CT angiogram.  Lesion was crossed with a versa core wire.  Over the wire a 7 x  40 mm Olevia stent was  delivered and deployed taking great care to avoid jailing the origin of the left profunda femoris artery.  The stent was postdilated with a 6 x 40 mm Mustang balloon.  Completion angiography revealed good result with resolution of the flow-limiting stenosis.  Angiography was then performed on the leg to confirm that no debris had embolized.  Normal angiogram was noted.  Catheters were removed.  The 6 [place] by 90 cm sheath was removed over the wire and exchanged for a 6 [place] by 10 cm sheath.  This was left in place to be removed in the recovery area.  Conscious sedation was administered with the use of IV fentanyl and midazolam under continuous physician and nurse monitoring.  Heart rate, blood pressure, and oxygen saturation were continuously monitored.  Total sedation time was 40 minutes  Upon completion of the case instrument and sharps counts were confirmed correct. The patient was transferred to the PACU in good condition. I was present for all portions of the procedure.  PLAN: Aspirin 81 mg by mouth daily indefinitely.  Plavix 75 mg by mouth daily x1 month.  Lipitor 40 mg by mouth daily indefinitely.  Follow-up with me in 1 to 2 weeks with repeat ABI.  [name], MD [facility] Phone Number: [phone] 03/29/2021 8:30 AM

## 2021-04-01 ENCOUNTER — Encounter (HOSPITAL_COMMUNITY): Payer: Self-pay | Admitting: Vascular Surgery

## 2021-04-05 ENCOUNTER — Other Ambulatory Visit: Payer: Self-pay

## 2021-04-05 DIAGNOSIS — I739 Peripheral vascular disease, unspecified: Secondary | ICD-10-CM

## 2021-04-15 NOTE — Progress Notes (Signed)
[facility] [place] PROGRESS NOTE  ASSESSMENT / PLAN: Lacey Vega is a 70 y.o. female s/p EVAR complicated by left femoral acute limb ischemia requiring thrombectomy.  She had persistent stenosis at the proximal superficial femoral artery which I treated endovascularly/28/22.  She has had normalization of her ankle-brachial index and good healing of her calf.  I think we he can begin routine surveillance post EVAR.  We will see her again in 6 months with duplex of the stent graft.  SUBJECTIVE: Doing better from a left leg standpoint. Left calf has nearly healed. No pain in the foot or legs. Still some discomfort in the thighs with walking which may be improving.   OBJECTIVE: BP 102/62 (BP Location: Left Arm, Patient Position: Sitting, Cuff Size: Large)   Pulse 75   Temp 97.9 F (36.6 C)   Resp 20   Ht 5\' 7"  (1.702 m)   Wt 176 lb (79.8 kg)   SpO2 97%   BMI 27.57 kg/m  No acute distress Regular rate and rhythm Unlabored breathing Soft abdomen Left calf incision nearly healed  CBC Latest Ref Rng & Units 03/29/2021 02/05/2021 02/04/2021  WBC 4.0 - 10.5 K/uL - 9.5 8.9  Hemoglobin 12.0 - 15.0 g/dL 8.8(L) 9.5(L) 7.0(L)  Hematocrit 36.0 - 46.0 % 26.0(L) 29.2(L) 22.3(L)  Platelets 150 - 400 K/uL - 248 201     CMP Latest Ref Rng & Units 03/29/2021 03/14/2021 02/01/2021  Glucose 70 - 99 mg/dL 113(H) - 158(H)  BUN 8 - 23 mg/dL 14 - 13  Creatinine 0.44 - 1.00 mg/dL 1.20(H) 1.00 1.11(H)  Sodium 135 - 145 mmol/L 141 - 136  Potassium 3.5 - 5.1 mmol/L 3.9 - 4.6  Chloride 98 - 111 mmol/L 102 - 106  CO2 22 - 32 mmol/L - - 23  Calcium 8.9 - 10.3 mg/dL - - 8.4(L)  Total Protein 6.5 - 8.1 g/dL - - -  Total Bilirubin 0.3 - 1.2 mg/dL - - -  Alkaline Phos 38 - 126 U/L - - -  AST 15 - 41 U/L - - -  ALT 0 - 44 U/L - - -    +-------+-----------+-----------+------------+------------+  ABI/TBIToday's ABIToday's TBIPrevious ABIPrevious TBI   +-------+-----------+-----------+------------+------------+  Right  1.06       0.23       1.05        0.54          +-------+-----------+-----------+------------+------------+  Left   0.98       0.53       0.48        0.19          +-------+-----------+-----------+------------+------------+   Lacey Aline. [name], MD [facility] Phone Number: [phone] 04/16/2021 5:08 PM

## 2021-04-16 ENCOUNTER — Encounter: Payer: Self-pay | Admitting: Vascular Surgery

## 2021-04-16 ENCOUNTER — Other Ambulatory Visit: Payer: Self-pay | Admitting: Internal Medicine

## 2021-04-16 ENCOUNTER — Other Ambulatory Visit: Payer: Self-pay

## 2021-04-16 ENCOUNTER — Ambulatory Visit (INDEPENDENT_AMBULATORY_CARE_PROVIDER_SITE_OTHER): Payer: Medicare HMO | Admitting: Vascular Surgery

## 2021-04-16 ENCOUNTER — Ambulatory Visit (HOSPITAL_COMMUNITY)
Admission: RE | Admit: 2021-04-16 | Discharge: 2021-04-16 | Disposition: A | Discharge status: Home / Self Care | Payer: Medicare HMO | Source: Ambulatory Visit | Attending: Vascular Surgery | Admitting: Vascular Surgery

## 2021-04-16 VITALS — BP 102/62 | HR 75 | Temp 97.9°F | Resp 20 | Ht 67.0 in | Wt 176.0 lb

## 2021-04-16 DIAGNOSIS — I739 Peripheral vascular disease, unspecified: Secondary | ICD-10-CM

## 2021-04-16 DIAGNOSIS — Z1231 Encounter for screening mammogram for malignant neoplasm of breast: Secondary | ICD-10-CM

## 2021-04-18 ENCOUNTER — Other Ambulatory Visit: Payer: Self-pay

## 2021-04-18 DIAGNOSIS — I739 Peripheral vascular disease, unspecified: Secondary | ICD-10-CM

## 2021-04-18 DIAGNOSIS — Z8679 Personal history of other diseases of the circulatory system: Secondary | ICD-10-CM

## 2021-04-22 ENCOUNTER — Inpatient Hospital Stay (HOSPITAL_COMMUNITY)
Admission: EM | Admit: 2021-04-22 | Discharge: 2021-04-24 | DRG: 378 | Disposition: A | Discharge status: Home / Self Care | Payer: Medicare HMO | Attending: Internal Medicine | Admitting: Internal Medicine

## 2021-04-22 ENCOUNTER — Emergency Department (HOSPITAL_COMMUNITY): Payer: Medicare HMO

## 2021-04-22 ENCOUNTER — Encounter (HOSPITAL_COMMUNITY): Payer: Self-pay

## 2021-04-22 DIAGNOSIS — Z833 Family history of diabetes mellitus: Secondary | ICD-10-CM

## 2021-04-22 DIAGNOSIS — E039 Hypothyroidism, unspecified: Secondary | ICD-10-CM | POA: Diagnosis present

## 2021-04-22 DIAGNOSIS — K2971 Gastritis, unspecified, with bleeding: Principal | ICD-10-CM | POA: Diagnosis present

## 2021-04-22 DIAGNOSIS — N179 Acute kidney failure, unspecified: Secondary | ICD-10-CM | POA: Diagnosis present

## 2021-04-22 DIAGNOSIS — K5520 Angiodysplasia of colon without hemorrhage: Secondary | ICD-10-CM | POA: Diagnosis present

## 2021-04-22 DIAGNOSIS — Z79890 Hormone replacement therapy (postmenopausal): Secondary | ICD-10-CM

## 2021-04-22 DIAGNOSIS — Z8679 Personal history of other diseases of the circulatory system: Secondary | ICD-10-CM

## 2021-04-22 DIAGNOSIS — Z87891 Personal history of nicotine dependence: Secondary | ICD-10-CM | POA: Diagnosis not present

## 2021-04-22 DIAGNOSIS — R040 Epistaxis: Secondary | ICD-10-CM | POA: Diagnosis present

## 2021-04-22 DIAGNOSIS — E119 Type 2 diabetes mellitus without complications: Secondary | ICD-10-CM | POA: Diagnosis not present

## 2021-04-22 DIAGNOSIS — J449 Chronic obstructive pulmonary disease, unspecified: Secondary | ICD-10-CM | POA: Diagnosis not present

## 2021-04-22 DIAGNOSIS — Z7982 Long term (current) use of aspirin: Secondary | ICD-10-CM

## 2021-04-22 DIAGNOSIS — N1831 Chronic kidney disease, stage 3a: Secondary | ICD-10-CM | POA: Diagnosis present

## 2021-04-22 DIAGNOSIS — J4489 Other specified chronic obstructive pulmonary disease: Secondary | ICD-10-CM | POA: Diagnosis present

## 2021-04-22 DIAGNOSIS — D62 Acute posthemorrhagic anemia: Secondary | ICD-10-CM | POA: Diagnosis present

## 2021-04-22 DIAGNOSIS — Z8249 Family history of ischemic heart disease and other diseases of the circulatory system: Secondary | ICD-10-CM | POA: Diagnosis not present

## 2021-04-22 DIAGNOSIS — E1122 Type 2 diabetes mellitus with diabetic chronic kidney disease: Secondary | ICD-10-CM | POA: Diagnosis present

## 2021-04-22 DIAGNOSIS — E785 Hyperlipidemia, unspecified: Secondary | ICD-10-CM

## 2021-04-22 DIAGNOSIS — I959 Hypotension, unspecified: Secondary | ICD-10-CM | POA: Diagnosis present

## 2021-04-22 DIAGNOSIS — Z7902 Long term (current) use of antithrombotics/antiplatelets: Secondary | ICD-10-CM

## 2021-04-22 DIAGNOSIS — I129 Hypertensive chronic kidney disease with stage 1 through stage 4 chronic kidney disease, or unspecified chronic kidney disease: Secondary | ICD-10-CM | POA: Diagnosis present

## 2021-04-22 DIAGNOSIS — D571 Sickle-cell disease without crisis: Secondary | ICD-10-CM | POA: Diagnosis present

## 2021-04-22 DIAGNOSIS — D649 Anemia, unspecified: Secondary | ICD-10-CM | POA: Diagnosis not present

## 2021-04-22 DIAGNOSIS — Z809 Family history of malignant neoplasm, unspecified: Secondary | ICD-10-CM | POA: Diagnosis not present

## 2021-04-22 DIAGNOSIS — K2100 Gastro-esophageal reflux disease with esophagitis, without bleeding: Secondary | ICD-10-CM | POA: Diagnosis present

## 2021-04-22 DIAGNOSIS — M47812 Spondylosis without myelopathy or radiculopathy, cervical region: Secondary | ICD-10-CM | POA: Diagnosis present

## 2021-04-22 DIAGNOSIS — D573 Sickle-cell trait: Secondary | ICD-10-CM

## 2021-04-22 DIAGNOSIS — Z79899 Other long term (current) drug therapy: Secondary | ICD-10-CM | POA: Diagnosis not present

## 2021-04-22 DIAGNOSIS — Z8774 Personal history of (corrected) congenital malformations of heart and circulatory system: Secondary | ICD-10-CM

## 2021-04-22 DIAGNOSIS — Z981 Arthrodesis status: Secondary | ICD-10-CM

## 2021-04-22 DIAGNOSIS — I739 Peripheral vascular disease, unspecified: Secondary | ICD-10-CM | POA: Diagnosis not present

## 2021-04-22 DIAGNOSIS — I7143 Infrarenal abdominal aortic aneurysm, without rupture: Secondary | ICD-10-CM

## 2021-04-22 DIAGNOSIS — Z885 Allergy status to narcotic agent status: Secondary | ICD-10-CM

## 2021-04-22 DIAGNOSIS — R55 Syncope and collapse: Secondary | ICD-10-CM

## 2021-04-22 DIAGNOSIS — I714 Abdominal aortic aneurysm, without rupture, unspecified: Secondary | ICD-10-CM | POA: Diagnosis present

## 2021-04-22 DIAGNOSIS — K922 Gastrointestinal hemorrhage, unspecified: Secondary | ICD-10-CM

## 2021-04-22 DIAGNOSIS — Z20822 Contact with and (suspected) exposure to covid-19: Secondary | ICD-10-CM | POA: Diagnosis present

## 2021-04-22 DIAGNOSIS — E1151 Type 2 diabetes mellitus with diabetic peripheral angiopathy without gangrene: Secondary | ICD-10-CM | POA: Diagnosis present

## 2021-04-22 LAB — COMPREHENSIVE METABOLIC PANEL
ALT: 9 U/L (ref 0–44)
AST: 17 U/L (ref 15–41)
Albumin: 3.2 g/dL — ABNORMAL LOW (ref 3.5–5.0)
Alkaline Phosphatase: 105 U/L (ref 38–126)
Anion gap: 15 (ref 5–15)
BUN: 18 mg/dL (ref 8–23)
CO2: 25 mmol/L (ref 22–32)
Calcium: 9.4 mg/dL (ref 8.9–10.3)
Chloride: 101 mmol/L (ref 98–111)
Creatinine, Ser: 1.26 mg/dL — ABNORMAL HIGH (ref 0.44–1.00)
GFR, Estimated: 46 mL/min — ABNORMAL LOW (ref >60)
Glucose, Bld: 167 mg/dL — ABNORMAL HIGH (ref 70–99)
Potassium: 3.7 mmol/L (ref 3.5–5.1)
Sodium: 141 mmol/L (ref 135–145)
Total Bilirubin: 0.8 mg/dL (ref 0.3–1.2)
Total Protein: 7.2 g/dL (ref 6.5–8.1)

## 2021-04-22 LAB — CBC WITH DIFFERENTIAL/PLATELET
Abs Immature Granulocytes: 0.04 10*3/uL (ref 0.00–0.07)
Basophils Absolute: 0 10*3/uL (ref 0.0–0.1)
Basophils Relative: 0 %
Eosinophils Absolute: 0.2 10*3/uL (ref 0.0–0.5)
Eosinophils Relative: 2 %
HCT: 24.1 % — ABNORMAL LOW (ref 36.0–46.0)
Hemoglobin: 7.2 g/dL — ABNORMAL LOW (ref 12.0–15.0)
Immature Granulocytes: 0 %
Lymphocytes Relative: 19 %
Lymphs Abs: 1.9 10*3/uL (ref 0.7–4.0)
MCH: 25.3 pg — ABNORMAL LOW (ref 26.0–34.0)
MCHC: 29.9 g/dL — ABNORMAL LOW (ref 30.0–36.0)
MCV: 84.6 fL (ref 80.0–100.0)
Monocytes Absolute: 0.8 10*3/uL (ref 0.1–1.0)
Monocytes Relative: 7 %
Neutro Abs: 7.3 10*3/uL (ref 1.7–7.7)
Neutrophils Relative %: 72 %
Platelets: 440 10*3/uL — ABNORMAL HIGH (ref 150–400)
RBC: 2.85 MIL/uL — ABNORMAL LOW (ref 3.87–5.11)
RDW: 18.6 % — ABNORMAL HIGH (ref 11.5–15.5)
WBC: 10.2 10*3/uL (ref 4.0–10.5)
nRBC: 0.2 % (ref 0.0–0.2)

## 2021-04-22 LAB — CBG MONITORING, ED: Glucose-Capillary: 128 mg/dL — ABNORMAL HIGH (ref 70–99)

## 2021-04-22 LAB — POC OCCULT BLOOD, ED: Fecal Occult Bld: POSITIVE — AB

## 2021-04-22 LAB — PROTIME-INR
INR: 1 (ref 0.8–1.2)
Prothrombin Time: 13.3 seconds (ref 11.4–15.2)

## 2021-04-22 LAB — PREPARE RBC (CROSSMATCH)

## 2021-04-22 LAB — TROPONIN I (HIGH SENSITIVITY)
Troponin I (High Sensitivity): 4 ng/L (ref <18)
Troponin I (High Sensitivity): 4 ng/L (ref <18)

## 2021-04-22 LAB — BRAIN NATRIURETIC PEPTIDE: B Natriuretic Peptide: 30.6 pg/mL (ref 0.0–100.0)

## 2021-04-22 MED ORDER — OXYMETAZOLINE HCL 0.05 % NA SOLN
2.0000 | Freq: Once | NASAL | Status: AC
  Administered 2021-04-22: 2 via NASAL

## 2021-04-22 MED ORDER — SODIUM CHLORIDE 0.9 % IV SOLN
10.0000 mL/h | Freq: Once | INTRAVENOUS | Status: AC
  Administered 2021-04-22: 10 mL/h via INTRAVENOUS

## 2021-04-22 NOTE — ED Notes (Signed)
Pt given chicken broth and ginger ale per request and MD order. Pt denies any complaints. Blood infusing without difficulty. Will continue to monitor. No acute changes noted.

## 2021-04-22 NOTE — ED Provider Notes (Signed)
[facility] EMERGENCY DEPARTMENT Provider Note   CSN: [id number] Arrival date & time: 04/22/21  1705     History Chief Complaint  Patient presents with   Epistaxis    Lacey Vega is a 70 y.o. female.  The history is provided by the patient, a relative and medical records. No language interpreter was used.  Epistaxis Location:  Bilateral Severity:  Severe Duration:  3 hours Timing:  Constant Progression:  Unchanged Chronicity:  New Context comment:  Plaxiv use Relieved by:  Nothing Worsened by:  Nothing Ineffective treatments:  Applying pressure Associated symptoms: syncope   Associated symptoms: no congestion, no cough, no fever and no headaches   Risk factors: no frequent nosebleeds       Past Medical History:  Diagnosis Date   Anemia    Anxiety    Asthma    Asthma    Bronchitis    hx   Colon polyps    hyperplastic   Diabetes (HCC)    mild   Diabetes mellitus    DJD (degenerative joint disease), cervical    Dysphagia    history of   Dyspnea    Gallstones    GERD (gastroesophageal reflux disease)    H/O chest pain    secondary to anemia   Headache    History of arteriovenous malformation (AVM)    History of hiatal hernia    History of tobacco use    Hyperlipemia    Hypertension    Hypothyroidism    Iron deficiency anemia    Sickle cell anemia (HCC)    "patient is not aware of this"   Thyroid disease    TIA (transient ischemic attack)    "was told that she possible had one"    Patient Active Problem List   Diagnosis Date Noted   AAA (abdominal aortic aneurysm) 02/01/2021   Aortic aneurysm ([place]) 01/31/2021   Achalasia 10/11/2017   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   DJD (degenerative joint disease), cervical    Iron deficiency anemia    History of arteriovenous malformation (AVM)    History of tobacco use    Asthma    H/O chest pain    Diabetes ([place])    Dysphagia    Iron deficiency  anemia, unspecified 12/03/2011   Hypothyroidism (acquired) 10/07/2010    Class: Chronic   DYSPHAGIA UNSPECIFIED 10/05/2009   ANEMIA 08/20/2009   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   DM 04/17/2009   Asthma with COPD ([place]) 07/05/1990    Class: Chronic    Past Surgical History:  Procedure Laterality Date   ABDOMINAL AORTIC ENDOVASCULAR STENT GRAFT Bilateral 01/31/2021   Procedure: ENDOVASCULAR ANEURYSM REPAIR (EVAR)Bilateral Groin Cutdown, left femoral endaterectomy with bovine patch angioplasty.;  Surgeon: Cherre Robins, MD;  Location: [facility] OR;  Service: Vascular;  Laterality: Bilateral;   ABDOMINAL HYSTERECTOMY     ANTERIOR CERVICAL DECOMP/DISCECTOMY FUSION N/A 10/03/2016   Procedure: ANTERIOR CERVICAL DECOMPRESSION/DISCECTOMY FUSION CERVICAL THREE- CERVICAL FOUR;  Surgeon: Ashok Pall, MD;  Location: [place];  Service: Neurosurgery;  Laterality: N/A;  Right side approach   BOTOX INJECTION  08/29/2015   Procedure: BOTOX INJECTION;  Surgeon: Wilford Corner, MD;  Location: [place];  Service: Endoscopy;;   CHOLECYSTECTOMY     COLONOSCOPY  12/03/2011   Procedure: COLONOSCOPY;  Surgeon: [name], MD;  Location: [facility];  Service: Endoscopy;  Laterality: N/A;   ESOPHAGEAL MANOMETRY N/A 07/13/2017   Procedure: ESOPHAGEAL MANOMETRY (EM);  Surgeon: Wilford Corner, MD;  Location: [facility];  Service: Endoscopy;  Laterality: N/A;   ESOPHAGOGASTRODUODENOSCOPY  12/03/2011   Procedure: ESOPHAGOGASTRODUODENOSCOPY (EGD);  Surgeon: [name], MD;  Location: [facility];  Service: Endoscopy;  Laterality: N/A;   ESOPHAGOGASTRODUODENOSCOPY (EGD) WITH PROPOFOL N/A 08/29/2015   Procedure: ESOPHAGOGASTRODUODENOSCOPY (EGD) WITH PROPOFOL;  Surgeon: Wilford Corner, MD;  Location: [facility] ENDOSCOPY;  Service: Endoscopy;  Laterality: N/A;   ESOPHAGOGASTRODUODENOSCOPY (EGD) WITH PROPOFOL N/A 05/17/2019   Procedure: ESOPHAGOGASTRODUODENOSCOPY (EGD) WITH PROPOFOL;   Surgeon: Wilford Corner, MD;  Location: [facility];  Service: Endoscopy;  Laterality: N/A;   GIVENS CAPSULE STUDY N/A 08/05/2018   Procedure: GIVENS CAPSULE STUDY;  Surgeon: Wilford Corner, MD;  Location: [place];  Service: Endoscopy;  Laterality: N/A;   HOT HEMOSTASIS  12/03/2011   Procedure: HOT HEMOSTASIS (ARGON PLASMA COAGULATION/BICAP);  Surgeon: [name], MD;  Location: [facility];  Service: Endoscopy;  Laterality: N/A;   NECK SURGERY     PERIPHERAL VASCULAR INTERVENTION  03/29/2021   Procedure: PERIPHERAL VASCULAR INTERVENTION;  Surgeon: Cherre Robins, MD;  Location: [place] [facility];  Service: Cardiovascular;;  Lt SFA Brachial Approach   ROTATOR CUFF REPAIR     right   ROTATOR CUFF REPAIR Right 2014   THROMBECTOMY FEMORAL ARTERY Left 01/31/2021   Procedure: THROMBECTOMY POPLITEAL  ARTERY;  Surgeon: Cherre Robins, MD;  Location: [facility] OR;  Service: Vascular;  Laterality: Left;   TONSILLECTOMY       OB History   No obstetric history on file.     Family History  Problem Relation Age of Onset   Diabetes Mother    Heart attack Brother    Cancer Maternal Aunt     Social History   Tobacco Use   Smoking status: Former    Types: Cigarettes    Quit date: 06/02/2000    Years since quitting: 20.9   Smokeless tobacco: Never  Vaping Use   Vaping Use: Never used  Substance Use Topics   Alcohol use: No   Drug use: No    Home Medications Prior to Admission medications   Medication Sig Start Date End Date Taking? Authorizing Provider  albuterol (VENTOLIN HFA) 108 (90 Base) MCG/ACT inhaler Inhale 2 puffs into the lungs every 6 (six) hours as needed for wheezing or shortness of breath.     [provider]  aspirin EC 81 MG tablet Take 81 mg by mouth daily.    [provider]  atorvastatin (LIPITOR) 40 MG tablet Take 40 mg by mouth daily.    [provider]  carisoprodol (SOMA) 250 MG tablet Take 250 mg by mouth 2 (two) times daily  as needed (cramps).  05/04/19   [provider]  CINNAMON PO Take 1 tablet by mouth daily. 1000mg     [provider]  clopidogrel (PLAVIX) 75 MG tablet Take 1 tablet (75 mg total) by mouth daily. 03/29/21 03/29/22  Cherre Robins, MD  Cyanocobalamin (VITAMIN B 12) 500 MCG TABS Take 1,000 mg by mouth every morning.    [provider]  docusate sodium (COLACE) 100 MG capsule Take 100 mg by mouth daily.    [provider]  donepezil (ARICEPT) 5 MG tablet Take 5 mg by mouth at bedtime.    [provider]  ferrous sulfate ([employer] IRON) 325 (65 FE) MG tablet TAKE 1 TABLET EVERY DAY . PLEASE TAKE 1 PILL DAILY WITH ORANGE JUICE OR 500 UNITS OF VITAMIN C 03/10/21   Narda Rutherford  T IV, MD  furosemide (LASIX) 20 MG tablet Take 20 mg by mouth daily.     [provider]  levothyroxine (SYNTHROID) 125 MCG tablet Take 125 mcg by mouth daily before breakfast.    [provider]  Magnesium Oxide 420 (252 Mg) MG TABS Take 420 mg by mouth daily.    [provider]  montelukast (SINGULAIR) 10 MG tablet Take 10 mg by mouth daily.      [provider]  Multiple Vitamins-Minerals (HAIR/SKIN/NAILS/BIOTIN PO) Take 1 tablet by mouth daily.    [provider]  nitroGLYCERIN (NITROSTAT) 0.4 MG SL tablet Place 0.4 mg under the tongue every 5 (five) minutes as needed for chest pain.    [provider]  Omega-3 Fatty Acids (FISH OIL BURP-LESS) 1200 MG CAPS Take 2 capsules by mouth daily.    [provider]  omeprazole (PRILOSEC) 40 MG capsule Take 40 mg by mouth daily.    [provider]  traMADol (ULTRAM) 50 MG tablet Take 1 tablet (50 mg total) by mouth every 6 (six) hours as needed. 02/05/21 02/05/22  Ulyses Amor, PA-C  traMADol (ULTRAM) 50 MG tablet Take 1 tablet (50 mg total) by mouth every 6 (six) hours as needed. 03/19/21   Cherre Robins, MD    Allergies    Oxycodone  Review of Systems   Review of  Systems  Constitutional:  Positive for fatigue. Negative for chills, diaphoresis and fever.  HENT:  Positive for nosebleeds. Negative for congestion.   Eyes:  Negative for visual disturbance.  Respiratory:  Positive for shortness of breath. Negative for cough, chest tightness and wheezing.   Cardiovascular:  Positive for syncope. Negative for chest pain, palpitations and leg swelling.  Gastrointestinal:  Negative for abdominal pain, constipation, diarrhea, nausea and vomiting.       Dark tarry stools recently  Genitourinary:  Negative for dysuria and frequency.  Musculoskeletal:  Negative for back pain, neck pain and neck stiffness.  Skin:  Negative for rash and wound.  Neurological:  Positive for syncope and light-headedness. Negative for numbness and headaches.  Psychiatric/Behavioral:  Negative for agitation and confusion.   All other systems reviewed and are negative.  Physical Exam Updated Vital Signs BP 90/60 (BP Location: Left Arm)   Pulse (!) 110   Temp 97.6 F (36.4 C) (Oral)   Resp (!) 22   SpO2 100%   Physical Exam Vitals and nursing note reviewed.  Constitutional:      General: She is not in acute distress.    Appearance: She is well-developed. She is ill-appearing. She is not toxic-appearing or diaphoretic.  HENT:     Head: Atraumatic.     Comments: Bright blood coming from both nares and down the back of her oropharynx.  Upper palate is pale.    Nose: No congestion.     Right Nostril: Epistaxis present.     Left Nostril: Epistaxis present.  Eyes:     Extraocular Movements: Extraocular movements intact.     Conjunctiva/sclera: Conjunctivae normal.     Pupils: Pupils are equal, round, and reactive to light.  Cardiovascular:     Rate and Rhythm: Normal rate and regular rhythm.     Heart sounds: No murmur heard. Pulmonary:     Effort: Pulmonary effort is normal. No respiratory distress.     Breath sounds: Normal breath sounds. No wheezing, rhonchi or rales.   Chest:     Chest wall: No tenderness.  Abdominal:  General: Abdomen is flat.     Palpations: Abdomen is soft.     Tenderness: There is no abdominal tenderness. There is no right CVA tenderness, left CVA tenderness, guarding or rebound.  Musculoskeletal:        General: No swelling or tenderness.     Cervical back: Neck supple. No tenderness.  Skin:    General: Skin is warm and dry.     Capillary Refill: Capillary refill takes less than 2 seconds.     Findings: No erythema.  Neurological:     General: No focal deficit present.     Mental Status: She is alert.     Sensory: No sensory deficit.     Motor: No weakness.  Psychiatric:        Mood and Affect: Mood normal.    ED Results / Procedures / Treatments   Labs (all labs ordered are listed, but only abnormal results are displayed) Labs Reviewed  CBC WITH DIFFERENTIAL/PLATELET - Abnormal; Notable for the following components:      Result Value   RBC 2.85 (*)    Hemoglobin 7.2 (*)    HCT 24.1 (*)    MCH 25.3 (*)    MCHC 29.9 (*)    RDW 18.6 (*)    Platelets 440 (*)    All other components within normal limits  COMPREHENSIVE METABOLIC PANEL - Abnormal; Notable for the following components:   Glucose, Bld 167 (*)    Creatinine, Ser 1.26 (*)    Albumin 3.2 (*)    GFR, Estimated 46 (*)    All other components within normal limits  CBG MONITORING, ED - Abnormal; Notable for the following components:   Glucose-Capillary 128 (*)    All other components within normal limits  POC OCCULT BLOOD, ED - Abnormal; Notable for the following components:   Fecal Occult Bld POSITIVE (*)    All other components within normal limits  PROTIME-INR  BRAIN NATRIURETIC PEPTIDE  TYPE AND SCREEN  PREPARE RBC (CROSSMATCH)  TROPONIN I (HIGH SENSITIVITY)  TROPONIN I (HIGH SENSITIVITY)    EKG EKG Interpretation  Date/Time:  Monday April 22 2021 18:06:52 EST Ventricular Rate:  104 PR Interval:  177 QRS Duration: 76 QT  Interval:  322 QTC Calculation: 424 R Axis:   70 Text Interpretation: Sinus tachycardia Consider right atrial enlargement When compared to prior, faster rate. No STEMI Confirmed by Antony Blackbird [phone]) on 04/22/2021 6:17:39 PM  Radiology DG Chest Portable 1 View  Result Date: 04/22/2021 CLINICAL DATA:  Syncope, exertional shortness of breath. Diaphoresis. Nose bleed. History of asthma. EXAM: PORTABLE CHEST 1 VIEW COMPARISON:  02/01/2021 FINDINGS: Stable mild prominence of the central pulmonary arteries potentially reflecting pulmonary arterial hypertension. Atherosclerotic calcification of the aortic arch. No cardiomegaly. Emphysema is present. The lungs appear otherwise clear. Lower cervical plate and screw fixator. No blunting of the costophrenic angles. IMPRESSION: 1. Emphysema with prominent central pulmonary arteries suggesting secondary pulmonary arterial hypertension. 2.  Aortic Atherosclerosis (ICD10-I70.0). 3. No acute findings. Electronically Signed   By: Van Clines M.D.   On: 04/22/2021 18:32    Procedures .Epistaxis Management  Date/Time: 04/22/2021 10:26 PM Performed by: Courtney Paris, MD Authorized by: Courtney Paris, MD   Consent:    Consent obtained:  Verbal   Consent given by:  Patient   Risks, benefits, and alternatives were discussed: yes     Risks discussed:  Bleeding   Alternatives discussed:  No treatment Universal protocol:    Patient identity  confirmed:  Verbally with patient Anesthesia:    Anesthesia method:  None Procedure details:    Treatment site:  Unable to specify   Repair method: afrin.   Treatment complexity:  Limited   Treatment episode: initial   Post-procedure details:    Assessment:  Bleeding stopped   Procedure completion:  Tolerated Comments:     Afrin sprayed in both nares and pressure applied after nose blowing and suctioning blood.  Hemostasis achieved.    Medications Ordered in ED Medications  oxymetazoline  (AFRIN) 0.05 % nasal spray 2 spray (2 sprays Each Nare Given 04/22/21 1754)  0.9 %  sodium chloride infusion (10 mL/hr Intravenous New Bag/Given 04/22/21 2110)    ED Course  I have reviewed the triage vital signs and the nursing notes.  Pertinent labs & imaging results that were available during my care of the patient were reviewed by me and considered in my medical decision making (see chart for details).    MDM Rules/Calculators/A&P                           Stassi Fadely is a 70 y.o. female with a past medical history significant for asthma, diabetes, hypertension, GERD, hypothyroidism, hyperlipidemia, previous aortic aneurysm status post femoral endarterectomy and thrombectomy as well as stenting of the left superficial femoral artery, previous esophageal bleeding, and previous anemia who presents with 2 problems, epistaxis today as well as several days of worsening exertional shortness of breath and fatigue.  According to patient, since 3 PM today, she had a nosebleed that has been persistent.  She takes antiplatelet medication Plavix and has not missed any doses.  She took it this morning at 10 AM as normal.  She does say that recently she has had dark tarry stools and has had more exertional shortness of breath and fatigue for the last few days.  She reports what normally she can do she gets very winded and has to down.  She does feel lightheaded and near syncopal at times but denies any chest pain.  She says that her stool has been darker recently.  She denies any trauma.  She denies any constipation, diarrhea, or urinary changes.  Denies any fevers, chills, ejection, or cough.  While in triage, a CODE BLUE was called because patient was unresponsive briefly.  Patient then woke up after the syncopal episode and was quickly taken to exam room.  On exam, patient does have epistaxis coming from both nares and down the back of her throat.  She was nearly hypotensive with blood pressure  in the 90s and feeling lightheaded.  She does report shortness of breath but denies chest pain.  On my exam, lungs were clear and chest was nontender.  Abdomen was nontender.  Patient moving all extremities.  Patient has blood coming from both nares and down the back of her throat.  Patient blew all of the clot out of her nose and Afrin was utilized prior to nose pressure holding.  After 15 minutes of pressure in the Afrin, her epistaxis appears to have resolved completely.  She is feeling much better from the nosebleed.  Now we can focus on the exertional shortness of breath, dark tarry stools, and the worsened fatigue for the last few days.  We will do a fecal occult test and will get screening labs, chest x-ray, and cardiac work-up.  Due to her syncopal episode, soft pressures, and concern for likely recurrent GI  bleed with symptomatic anemia, anticipate she may need admission after work-up is completed.  8:29 PM Patient's hemoglobin has returned lower at 7.2 down from 8.8 3 weeks ago.  Clinically I am concerned about GI bleed and this epistaxis leading to symptomatic anemia with his unresponsive and nearly hypotensive episode.  We will order blood and will await the results of the fecal occult test.  Blood pressure is improved to just over 100 now.  We will transfuse with blood and admit for symptomatic anemia and syncope in the setting of both epistaxis and suspected GI bleed.  Fecal occult was positive.  Anticipate patient will need GI evaluation in the morning.  Chart review shows that she is been seen by Dr. Michail Sermon previously was with [facility].  Anticipate they will be called in the morning to evaluate patient.  Medicine team called for admission.   Final Clinical Impression(s) / ED Diagnoses Final diagnoses:  Symptomatic anemia  Syncope, unspecified syncope type  Epistaxis  Gastrointestinal hemorrhage, unspecified gastrointestinal hemorrhage type    Rx / DC Orders ED Discharge  Orders     None      Clinical Impression: 1. Symptomatic anemia   2. Syncope, unspecified syncope type   3. Epistaxis   4. Gastrointestinal hemorrhage, unspecified gastrointestinal hemorrhage type     Disposition: Admit  This note was prepared with assistance of Dragon voice recognition software. Occasional wrong-word or sound-a-like substitutions may have occurred due to the inherent limitations of voice recognition software.     Kiersten Coss, Gwenyth Allegra, MD 04/22/21 2229

## 2021-04-22 NOTE — H&P (Addendum)
History and Physical    Lacey Vega NUU:[id number] DOB: 1950-07-16 DOA: 04/22/2021  PCP: Rogers Blocker, MD  Patient coming from: Home  I have personally briefly reviewed patient's old medical records in [place]  Chief Complaint: Nosebleed  HPI: Lacey Vega is a 70 y.o. female with medical history significant for AAA s/p EVAR, PAD, type 2 diabetes, COPD/asthma, iron deficiency anemia, angiodysplasia of the small bowel,sickle cell trait, and hypothyroidism who presents with concerns of persistent nosebleed.  Patient had 2 hours of continuous nosebleed despite packing and decided to present to the ED.  She has intermittent nosebleeds but never this severe.  She is on both aspirin and Plavix. She also has noted a week of increasing shortness of breath with exertion.  Had dark tarry stool for a month since she was started on Plavix.  Back in 9/22 she underwent urgent EVAR for large infrarenal abdominal aortic aneurysm.  Repair was complicated by need for extended repair into the external iliac artery and left common femoral artery.  She shortly after developed left ischemic limb requiring thrombectomy.  Following procedure she continued to have claudication and underwent left superficial femoral artery stenting on 03/29/2021 and was started on Plavix for a month in addition to aspirin.   She also has extension GI hx with duodenal and gastric AVM requiring APC. Has chronic iron deficiency anemia due to chronic blood loss requiring both transfusion and IV iron in the past.  ED Course:  She was mildly hypotensive with systolic in the 34V and reportedly had vasovagal syncope episode in triage. Hemoglobin of 7.2 down from 8.8 several weeks ago.  FOBT positive.  Creatinine mildly elevated 1.26/BUN of 18. Sodium of 141, K of 3.7, BG of 167  Her nosebleed has resolved following Afrin.Being transfused 1u of pRBC.  Hospitalist then called for admission for symptomatic  anemia.  Review of Systems: Constitutional: No Weight Change, No Fever ENT/Mouth: No sore throat, No Rhinorrhea Eyes: No Eye Pain, No Vision Changes Cardiovascular: No Chest Pain, +SOB Respiratory: No Cough, No Sputum Gastrointestinal: No Nausea, No Vomiting, No Diarrhea, + Constipation, No Pain Genitourinary: no Urinary Incontinence Musculoskeletal: No Arthralgias, No Myalgias Skin: No Skin Lesions, No Pruritus, Neuro: no Weakness, No Numbness,  No Loss of Consciousness, + Syncope Psych: No Anxiety/Panic, No Depression, no decrease appetite Heme/Lymph: No Bruising, No Bleeding  Past Medical History:  Diagnosis Date   Anemia    Anxiety    Asthma    Asthma    Bronchitis    hx   Colon polyps    hyperplastic   Diabetes (HCC)    mild   Diabetes mellitus    DJD (degenerative joint disease), cervical    Dysphagia    history of   Dyspnea    Gallstones    GERD (gastroesophageal reflux disease)    H/O chest pain    secondary to anemia   Headache    History of arteriovenous malformation (AVM)    History of hiatal hernia    History of tobacco use    Hyperlipemia    Hypertension    Hypothyroidism    Iron deficiency anemia    Sickle cell anemia (HCC)    "patient is not aware of this"   Thyroid disease    TIA (transient ischemic attack)    "was told that she possible had one"    Past Surgical History:  Procedure Laterality Date   ABDOMINAL AORTIC ENDOVASCULAR STENT GRAFT Bilateral 01/31/2021  Procedure: ENDOVASCULAR ANEURYSM REPAIR (EVAR)Bilateral Groin Cutdown, left femoral endaterectomy with bovine patch angioplasty.;  Surgeon: Cherre Robins, MD;  Location: [facility];  Service: Vascular;  Laterality: Bilateral;   ABDOMINAL HYSTERECTOMY     ANTERIOR CERVICAL DECOMP/DISCECTOMY FUSION N/A 10/03/2016   Procedure: ANTERIOR CERVICAL DECOMPRESSION/DISCECTOMY FUSION CERVICAL THREE- CERVICAL FOUR;  Surgeon: Ashok Pall, MD;  Location: [facility];  Service: Neurosurgery;  Laterality:  N/A;  Right side approach   BOTOX INJECTION  08/29/2015   Procedure: BOTOX INJECTION;  Surgeon: Wilford Corner, MD;  Location: [facility];  Service: Endoscopy;;   CHOLECYSTECTOMY     COLONOSCOPY  12/03/2011   Procedure: COLONOSCOPY;  Surgeon: [name], MD;  Location: [facility];  Service: Endoscopy;  Laterality: N/A;   ESOPHAGEAL MANOMETRY N/A 07/13/2017   Procedure: ESOPHAGEAL MANOMETRY (EM);  Surgeon: Wilford Corner, MD;  Location: [facility];  Service: Endoscopy;  Laterality: N/A;   ESOPHAGOGASTRODUODENOSCOPY  12/03/2011   Procedure: ESOPHAGOGASTRODUODENOSCOPY (EGD);  Surgeon: [name], MD;  Location: [facility];  Service: Endoscopy;  Laterality: N/A;   ESOPHAGOGASTRODUODENOSCOPY (EGD) WITH PROPOFOL N/A 08/29/2015   Procedure: ESOPHAGOGASTRODUODENOSCOPY (EGD) WITH PROPOFOL;  Surgeon: Wilford Corner, MD;  Location[facility];  Service: Endoscopy;  Laterality: N/A;   ESOPHAGOGASTRODUODENOSCOPY (EGD) WITH PROPOFOL N/A 05/17/2019   Procedure: ESOPHAGOGASTRODUODENOSCOPY (EGD) WITH PROPOFOL;  Surgeon: Wilford Corner, MD;  Location: [facility];  Service: Endoscopy;  Laterality: N/A;   GIVENS CAPSULE STUDY N/A 08/05/2018   Procedure: GIVENS CAPSULE STUDY;  Surgeon: Wilford Corner, MD;  Location: [facility];  Service: Endoscopy;  Laterality: N/A;   HOT HEMOSTASIS  12/03/2011   Procedure: HOT HEMOSTASIS (ARGON PLASMA COAGULATION/BICAP);  Surgeon: [name], MD;  Location: [facility];  Service: Endoscopy;  Laterality: N/A;   NECK SURGERY     PERIPHERAL VASCULAR INTERVENTION  03/29/2021   Procedure: PERIPHERAL VASCULAR INTERVENTION;  Surgeon: Cherre Robins, MD;  Location: [facility];  Service: Cardiovascular;;  Lt SFA Brachial Approach   ROTATOR CUFF REPAIR     right   ROTATOR CUFF REPAIR Right 2014   THROMBECTOMY FEMORAL ARTERY Left 01/31/2021   Procedure: THROMBECTOMY POPLITEAL  ARTERY;  Surgeon: Cherre Robins, MD;  Location: [facility];   Service: Vascular;  Laterality: Left;   TONSILLECTOMY       reports that she quit smoking about 20 years ago. Her smoking use included cigarettes. She has never used smokeless tobacco. She reports that she does not drink alcohol and does not use drugs. Social History  Allergies  Allergen Reactions   Oxycodone Shortness Of Breath    Family History  Problem Relation Age of Onset   Diabetes Mother    Heart attack Brother    Cancer Maternal Aunt      Prior to Admission medications   Medication Sig Start Date End Date Taking? Authorizing Provider  albuterol (VENTOLIN HFA) 108 (90 Base) MCG/ACT inhaler Inhale 2 puffs into the lungs every 6 (six) hours as needed for wheezing or shortness of breath.     [provider]  aspirin EC 81 MG tablet Take 81 mg by mouth daily.    [provider]  atorvastatin (LIPITOR) 40 MG tablet Take 40 mg by mouth daily.    [provider]  carisoprodol (SOMA) 250 MG tablet Take 250 mg by mouth 2 (two) times daily as needed (cramps).  05/04/19   [provider]  CINNAMON PO Take 1 tablet by mouth daily. 1000mg     [provider]  clopidogrel (PLAVIX) 75 MG tablet Take 1 tablet (75 mg total) by mouth daily. 03/29/21 03/29/22  Cherre Robins, MD  Cyanocobalamin (VITAMIN B 12) 500 MCG TABS Take 1,000 mg by mouth every morning.    [provider]  docusate sodium (COLACE) 100 MG capsule Take 100 mg by mouth daily.    [provider]  donepezil (ARICEPT) 5 MG tablet Take 5 mg by mouth at bedtime.    [provider]  ferrous sulfate ([employer] IRON) 325 (65 FE) MG tablet TAKE 1 TABLET EVERY DAY . PLEASE TAKE 1 PILL DAILY WITH ORANGE JUICE OR 500 UNITS OF VITAMIN C 03/10/21   Orson Slick, MD  furosemide (LASIX) 20 MG tablet Take 20 mg by mouth daily.     [provider]  levothyroxine (SYNTHROID) 125 MCG tablet Take 125 mcg by mouth daily before breakfast.    [provider]   Magnesium Oxide 420 (252 Mg) MG TABS Take 420 mg by mouth daily.    [provider]  montelukast (SINGULAIR) 10 MG tablet Take 10 mg by mouth daily.      [provider]  Multiple Vitamins-Minerals (HAIR/SKIN/NAILS/BIOTIN PO) Take 1 tablet by mouth daily.    [provider]  nitroGLYCERIN (NITROSTAT) 0.4 MG SL tablet Place 0.4 mg under the tongue every 5 (five) minutes as needed for chest pain.    [provider]  Omega-3 Fatty Acids (FISH OIL BURP-LESS) 1200 MG CAPS Take 2 capsules by mouth daily.    [provider]  omeprazole (PRILOSEC) 40 MG capsule Take 40 mg by mouth daily.    [provider]  traMADol (ULTRAM) 50 MG tablet Take 1 tablet (50 mg total) by mouth every 6 (six) hours as needed. 02/05/21 02/05/22  Ulyses Amor, PA-C  traMADol (ULTRAM) 50 MG tablet Take 1 tablet (50 mg total) by mouth every 6 (six) hours as needed. 03/19/21   Cherre Robins, MD    Physical Exam: Vitals:   04/22/21 2000 04/22/21 2015 04/22/21 2100 04/22/21 2117  BP: 111/75 107/74 (!) 114/58 122/81  Pulse: 91 83 85 84  Resp: 16 16 16 16   Temp:   98.2 F (36.8 C) 98.2 F (36.8 C)  TempSrc:   Oral Oral  SpO2: 100% 99% 99% 99%    Constitutional: NAD, calm, comfortable, well-appearing elderly female laying at approximately 20 degree incline in bed Vitals:   04/22/21 2000 04/22/21 2015 04/22/21 2100 04/22/21 2117  BP: 111/75 107/74 (!) 114/58 122/81  Pulse: 91 83 85 84  Resp: 16 16 16 16   Temp:   98.2 F (36.8 C) 98.2 F (36.8 C)  TempSrc:   Oral Oral  SpO2: 100% 99% 99% 99%   Eyes: PERRL, lids and conjunctivae normal ENMT: Mucous membranes are moist.  No active bleeding from nasal passage  neck: normal, supple Respiratory: clear to auscultation bilaterally, no wheezing, no crackles. Normal respiratory effort. Cardiovascular: Regular rate and rhythm, no murmurs / rubs / gallops. No extremity edema.  Abdomen: no tenderness, no masses palpated.  Bowel sounds positive.  Musculoskeletal: no clubbing / cyanosis. No joint deformity upper and lower extremities. Good ROM, no contractures. Normal muscle tone.  Skin: no rashes, lesions, ulcers. No induration Neurologic: CN 2-12 grossly intact. Sensation intact, Strength 5/5 in all 4.  Psychiatric: Normal judgment and insight. Alert and oriented x 3. Normal mood.     Labs on Admission: I have personally reviewed following labs and imaging studies  CBC:  Recent Labs  Lab 04/22/21 1748  WBC 10.2  NEUTROABS 7.3  HGB 7.2*  HCT 24.1*  MCV 84.6  PLT 264*   Basic Metabolic Panel: Recent Labs  Lab 04/22/21 1748  NA 141  K 3.7  CL 101  CO2 25  GLUCOSE 167*  BUN 18  CREATININE 1.26*  CALCIUM 9.4   GFR: Estimated Creatinine Clearance: 44.5 mL/min (A) (by C-G formula based on SCr of 1.26 mg/dL (H)). Liver Function Tests: Recent Labs  Lab 04/22/21 1748  AST 17  ALT 9  ALKPHOS 105  BILITOT 0.8  PROT 7.2  ALBUMIN 3.2*   No results for input(s): LIPASE, AMYLASE in the last 168 hours. No results for input(s): AMMONIA in the last 168 hours. Coagulation Profile: Recent Labs  Lab 04/22/21 1748  INR 1.0   Cardiac Enzymes: No results for input(s): CKTOTAL, CKMB, CKMBINDEX, TROPONINI in the last 168 hours. BNP (last 3 results) No results for input(s): PROBNP in the last 8760 hours. HbA1C: No results for input(s): HGBA1C in the last 72 hours. CBG: Recent Labs  Lab 04/22/21 1738  GLUCAP 128*   Lipid Profile: No results for input(s): CHOL, HDL, LDLCALC, TRIG, CHOLHDL, LDLDIRECT in the last 72 hours. Thyroid Function Tests: No results for input(s): TSH, T4TOTAL, FREET4, T3FREE, THYROIDAB in the last 72 hours. Anemia Panel: No results for input(s): VITAMINB12, FOLATE, FERRITIN, TIBC, IRON, RETICCTPCT in the last 72 hours. Urine analysis:    Component Value Date/Time   COLORURINE YELLOW 03/27/2009 1801   APPEARANCEUR CLOUDY (A) 03/27/2009 1801   LABSPEC 1.003 (L)  03/27/2009 1801   PHURINE 6.5 03/27/2009 1801   GLUCOSEU NEGATIVE 03/27/2009 1801   HGBUR NEGATIVE 03/27/2009 1801   BILIRUBINUR NEGATIVE 03/27/2009 1801   KETONESUR NEGATIVE 03/27/2009 1801   PROTEINUR NEGATIVE 03/27/2009 1801   UROBILINOGEN 0.2 03/27/2009 1801   NITRITE NEGATIVE 03/27/2009 1801   LEUKOCYTESUR  03/27/2009 1801    NEGATIVE MICROSCOPIC NOT DONE ON URINES WITH NEGATIVE PROTEIN, BLOOD, LEUKOCYTES, NITRITE, OR GLUCOSE <1000 mg/dL.    Radiological Exams on Admission: DG Chest Portable 1 View  Result Date: 04/22/2021 CLINICAL DATA:  Syncope, exertional shortness of breath. Diaphoresis. Nose bleed. History of asthma. EXAM: PORTABLE CHEST 1 VIEW COMPARISON:  02/01/2021 FINDINGS: Stable mild prominence of the central pulmonary arteries potentially reflecting pulmonary arterial hypertension. Atherosclerotic calcification of the aortic arch. No cardiomegaly. Emphysema is present. The lungs appear otherwise clear. Lower cervical plate and screw fixator. No blunting of the costophrenic angles. IMPRESSION: 1. Emphysema with prominent central pulmonary arteries suggesting secondary pulmonary arterial hypertension. 2.  Aortic Atherosclerosis (ICD10-I70.0). 3. No acute findings. Electronically Signed   By: Van Clines M.D.   On: 04/22/2021 18:32      Assessment/Plan  Acute symptomatic anemia from GI blood loss  Hx of iron deficiency anemia secondary to chronic blood loss Hx of AVM Sickle cell trait -pt noted melena for a month since starting plavix for her recent left superficial femoral artery stenting on 03/29/2021 --Hgb of 7.2 from 8.8 three weeks before. Getting 1 uPRBC and will follow post H/H.  -Transfusion threshold on Hgb <7 -Has extensive GI bleed hx. Followed by [facility] Dr. Michail Sermon. Epic messaged sent to consult in the morning 2013 underwent colonoscopy for anemia requiring transfuse and found to have duodenal and gastric AVM. Underwent argon plasma coagulation.   2019- POEM procedure for achalasia. Had duodenal AVM fulgurated  08/2018- capsule endoscopy: nonbleeding gastric and proximal small bowel AVM 05/2019: Underwent EGD which showed esophagitis  and small non-bleeding duodenal angiodysplasias  PAD Hx of AAA s/p EVAR complicated by left ischemic limb requiring thrombectomy Recent left superficial femoral artery stenting -Need to hold aspirin and plavix in the setting of active bleed   AKI - Creatinine of 1.26.  Follow repeat creatinine in the morning after blood transfusion  Epistaxis Has resolved with Afrin in the ED   COPD/asthma Not in acute exacerbation.  Shortness of breath likely due to symptomatic anemia. -Continue bronchodilator  Type 2 diabetes Hold any insulin for now while NPO  Hypothyroidism - Continue levothyroxine when not NPO  Hyperlipidemia Continue statin e when not NPO  DVT prophylaxis:.SCD  code Status: Full Family Communication: Plan discussed with patient and daughter at bedside  disposition Plan: Home with at least 2 midnight stays  Consults called: GI  Admission status: inpatient     Level of care: Progressive  Status is: Inpatient  Remains inpatient appropriate because: Symptomatic anemia from GI bleed requiring blood transfusion and GI consultation         Orene Desanctis DO [facility]   If 7PM-7AM, please contact night-coverage [email or web address]   04/22/2021, 10:50 PM

## 2021-04-22 NOTE — ED Triage Notes (Signed)
Pt here d/t nose bleed. Pt had syncopal episode. Thready pulse and slow. Pt diaphoretic. Blood coming from mouth and nose. Pt states she cant breath when came to.

## 2021-04-22 NOTE — ED Notes (Signed)
Pt given ice chips per request and MD approval. Waiting for further orders. Pt talking with daughter at bedside. No acute changes noted. Will continue to monitor.

## 2021-04-22 NOTE — ED Notes (Signed)
Bleeding controlled at present.

## 2021-04-23 ENCOUNTER — Other Ambulatory Visit: Payer: Self-pay

## 2021-04-23 ENCOUNTER — Encounter (HOSPITAL_COMMUNITY): Payer: Self-pay | Admitting: Family Medicine

## 2021-04-23 DIAGNOSIS — D649 Anemia, unspecified: Secondary | ICD-10-CM | POA: Diagnosis not present

## 2021-04-23 LAB — RESP PANEL BY RT-PCR (FLU A&B, COVID) ARPGX2
Influenza A by PCR: NEGATIVE
Influenza B by PCR: NEGATIVE
SARS Coronavirus 2 by RT PCR: NEGATIVE

## 2021-04-23 LAB — CBC
HCT: 24.7 % — ABNORMAL LOW (ref 36.0–46.0)
Hemoglobin: 8 g/dL — ABNORMAL LOW (ref 12.0–15.0)
MCH: 27.4 pg (ref 26.0–34.0)
MCHC: 32.4 g/dL (ref 30.0–36.0)
MCV: 84.6 fL (ref 80.0–100.0)
Platelets: 293 10*3/uL (ref 150–400)
RBC: 2.92 MIL/uL — ABNORMAL LOW (ref 3.87–5.11)
RDW: 17.2 % — ABNORMAL HIGH (ref 11.5–15.5)
WBC: 9.2 10*3/uL (ref 4.0–10.5)
nRBC: 0 % (ref 0.0–0.2)

## 2021-04-23 LAB — CBG MONITORING, ED: Glucose-Capillary: 85 mg/dL (ref 70–99)

## 2021-04-23 LAB — HEMOGLOBIN AND HEMATOCRIT, BLOOD
HCT: 26.3 % — ABNORMAL LOW (ref 36.0–46.0)
Hemoglobin: 8.3 g/dL — ABNORMAL LOW (ref 12.0–15.0)

## 2021-04-23 MED ORDER — PANTOPRAZOLE SODIUM 40 MG IV SOLR
40.0000 mg | Freq: Two times a day (BID) | INTRAVENOUS | Status: DC
Start: 2021-04-23 — End: 2021-04-24
  Administered 2021-04-23 – 2021-04-24 (×3): 40 mg via INTRAVENOUS
  Filled 2021-04-23 (×3): qty 40

## 2021-04-23 MED ORDER — SODIUM CHLORIDE 0.9 % IV SOLN: INTRAVENOUS | Status: DC

## 2021-04-23 NOTE — ED Notes (Signed)
Pt ambulatory to bathroom with walker and then back to room without difficulty.

## 2021-04-23 NOTE — H&P (View-Only) (Signed)
Referring Provider:  [facility] Primary Care Physician:  Rogers Blocker, MD Primary Gastroenterologist:  Dr. Michail Sermon  Reason for Consultation: Anemia  HPI: Lacey Vega is a 70 y.o. female with past medical history of AAA s/p EVAR which was complicated by left ischemic limb requiring thrombectomy.  She also underwent left superficial femoral artery stenting on March 29, 2021 and was started on Plavix along with aspirin.  She presented to the hospital with ongoing nosebleed.  GI is consulted for further evaluation of anemia given her history of duodenal and gastric AVMs.  She was found to have hemoglobin of 7.2 on admission down from 8.8 last month.  Mild elevated BUN at 18.  Normal LFTs.  Normal INR.  Hemoglobin improved to 8 with blood transfusion.  Patient seen and examined at bedside.  She has been having black tarry stool intermittently since starting Plavix last month.  She denies any abdominal pain, nausea or vomiting.  Started nosebleeding yesterday.  Denies seeing any bright red blood in the stool.   EGD in December 2020 by Dr. Michail Sermon showed LA grade C esophagitis at GE junction and few tiny nonbleeding duodenal AVMs.   Past Medical History:  Diagnosis Date   Anemia    Anxiety    Asthma    Asthma    Bronchitis    hx   Colon polyps    hyperplastic   Diabetes (HCC)    mild   Diabetes mellitus    DJD (degenerative joint disease), cervical    Dysphagia    history of   Dyspnea    Gallstones    GERD (gastroesophageal reflux disease)    H/O chest pain    secondary to anemia   Headache    History of arteriovenous malformation (AVM)    History of hiatal hernia    History of tobacco use    Hyperlipemia    Hypertension    Hypothyroidism    Iron deficiency anemia    Sickle cell anemia ([place])    "patient is not aware of this"   Thyroid disease    TIA (transient ischemic attack)    "was told that she possible had one"    Past Surgical History:  Procedure Laterality  Date   ABDOMINAL AORTIC ENDOVASCULAR STENT GRAFT Bilateral 01/31/2021   Procedure: ENDOVASCULAR ANEURYSM REPAIR (EVAR)Bilateral Groin Cutdown, left femoral endaterectomy with bovine patch angioplasty.;  Surgeon: Cherre Robins, MD;  Location: [facility];  Service: Vascular;  Laterality: Bilateral;   ABDOMINAL HYSTERECTOMY     ANTERIOR CERVICAL DECOMP/DISCECTOMY FUSION N/A 10/03/2016   Procedure: ANTERIOR CERVICAL DECOMPRESSION/DISCECTOMY FUSION CERVICAL THREE- CERVICAL FOUR;  Surgeon: Ashok Pall, MD;  Location: [place];  Service: Neurosurgery;  Laterality: N/A;  Right side approach   BOTOX INJECTION  08/29/2015   Procedure: BOTOX INJECTION;  Surgeon: Wilford Corner, MD;  Location: [place];  Service: Endoscopy;;   CHOLECYSTECTOMY     COLONOSCOPY  12/03/2011   Procedure: COLONOSCOPY;  Surgeon: [name], MD;  Location: [facility];  Service: Endoscopy;  Laterality: N/A;   ESOPHAGEAL MANOMETRY N/A 07/13/2017   Procedure: ESOPHAGEAL MANOMETRY (EM);  Surgeon: Wilford Corner, MD;  Location: [facility];  Service: Endoscopy;  Laterality: N/A;   ESOPHAGOGASTRODUODENOSCOPY  12/03/2011   Procedure: ESOPHAGOGASTRODUODENOSCOPY (EGD);  Surgeon: [name], MD;  Location: [facility];  Service: Endoscopy;  Laterality: N/A;   ESOPHAGOGASTRODUODENOSCOPY (EGD) WITH PROPOFOL N/A 08/29/2015   Procedure: ESOPHAGOGASTRODUODENOSCOPY (EGD) WITH PROPOFOL;  Surgeon: Wilford Corner, MD;  Location: [place];  Service: Endoscopy;  Laterality: N/A;   ESOPHAGOGASTRODUODENOSCOPY (EGD) WITH PROPOFOL N/A 05/17/2019   Procedure: ESOPHAGOGASTRODUODENOSCOPY (EGD) WITH PROPOFOL;  Surgeon: Wilford Corner, MD;  Location: [facility];  Service: Endoscopy;  Laterality: N/A;   GIVENS CAPSULE STUDY N/A 08/05/2018   Procedure: GIVENS CAPSULE STUDY;  Surgeon: Wilford Corner, MD;  Location: [place];  Service: Endoscopy;  Laterality: N/A;   HOT HEMOSTASIS  12/03/2011   Procedure: HOT HEMOSTASIS (ARGON PLASMA  COAGULATION/BICAP);  Surgeon: [name], MD;  Location: [facility];  Service: Endoscopy;  Laterality: N/A;   NECK SURGERY     PERIPHERAL VASCULAR INTERVENTION  03/29/2021   Procedure: PERIPHERAL VASCULAR INTERVENTION;  Surgeon: Cherre Robins, MD;  Location: [place] [facility];  Service: Cardiovascular;;  Lt SFA Brachial Approach   ROTATOR CUFF REPAIR     right   ROTATOR CUFF REPAIR Right 2014   THROMBECTOMY FEMORAL ARTERY Left 01/31/2021   Procedure: THROMBECTOMY POPLITEAL  ARTERY;  Surgeon: Cherre Robins, MD;  Location: [facility];  Service: Vascular;  Laterality: Left;   TONSILLECTOMY      Prior to Admission medications   Medication Sig Start Date End Date Taking? Authorizing Provider  albuterol (VENTOLIN HFA) 108 (90 Base) MCG/ACT inhaler Inhale 2 puffs into the lungs every 6 (six) hours as needed for wheezing or shortness of breath.     [provider]  aspirin EC 81 MG tablet Take 81 mg by mouth daily.    [provider]  atorvastatin (LIPITOR) 40 MG tablet Take 40 mg by mouth daily.    [provider]  carisoprodol (SOMA) 250 MG tablet Take 250 mg by mouth 2 (two) times daily as needed (cramps).  05/04/19   [provider]  CINNAMON PO Take 1 tablet by mouth daily. 1000mg     [provider]  clopidogrel (PLAVIX) 75 MG tablet Take 1 tablet (75 mg total) by mouth daily. 03/29/21 03/29/22  Cherre Robins, MD  Cyanocobalamin (VITAMIN B 12) 500 MCG TABS Take 1,000 mg by mouth every morning.    [provider]  docusate sodium (COLACE) 100 MG capsule Take 100 mg by mouth daily.    [provider]  donepezil (ARICEPT) 5 MG tablet Take 5 mg by mouth at bedtime.    [provider]  ferrous sulfate ([employer] IRON) 325 (65 FE) MG tablet TAKE 1 TABLET EVERY DAY . PLEASE TAKE 1 PILL DAILY WITH ORANGE JUICE OR 500 UNITS OF VITAMIN C 03/10/21   Orson Slick, MD  furosemide (LASIX) 20 MG tablet Take 20 mg by mouth daily.      [provider]  levothyroxine (SYNTHROID) 125 MCG tablet Take 125 mcg by mouth daily before breakfast.    [provider]  Magnesium Oxide 420 (252 Mg) MG TABS Take 420 mg by mouth daily.    [provider]  montelukast (SINGULAIR) 10 MG tablet Take 10 mg by mouth daily.      [provider]  Multiple Vitamins-Minerals (HAIR/SKIN/NAILS/BIOTIN PO) Take 1 tablet by mouth daily.    [provider]  nitroGLYCERIN (NITROSTAT) 0.4 MG SL tablet Place 0.4 mg under the tongue every 5 (five) minutes as needed for chest pain.    [provider]  Omega-3 Fatty Acids (FISH OIL BURP-LESS) 1200 MG CAPS Take 2 capsules by mouth daily.    [provider]  omeprazole (PRILOSEC) 40 MG capsule Take 40 mg by mouth daily.    [provider]  traMADol (  ULTRAM) 50 MG tablet Take 1 tablet (50 mg total) by mouth every 6 (six) hours as needed. 02/05/21 02/05/22  Ulyses Amor, PA-C  traMADol (ULTRAM) 50 MG tablet Take 1 tablet (50 mg total) by mouth every 6 (six) hours as needed. 03/19/21   Cherre Robins, MD    Scheduled Meds: Continuous Infusions: PRN Meds:.  Allergies as of 04/22/2021 - Review Complete 04/22/2021  Allergen Reaction Noted   Oxycodone Shortness Of Breath 03/12/2012    Family History  Problem Relation Age of Onset   Diabetes Mother    Heart attack Brother    Cancer Maternal Aunt     Social History   Socioeconomic History   Marital status: Divorced    Spouse name: Not on file   Number of children: 2   Years of education: Not on file   Highest education level: Not on file  Occupational History   Occupation: group leader environmental services    Employer: [employer]    Comment: [employer]  Tobacco Use   Smoking status: Former    Types: Cigarettes    Quit date: 06/02/2000    Years since quitting: 20.9   Smokeless tobacco: Never  Vaping Use   Vaping Use: Never used  Substance and Sexual  Activity   Alcohol use: No   Drug use: No   Sexual activity: Not on file  Other Topics Concern   Not on file  Social History Narrative   ** Merged History Encounter **       Social Determinants of Health   Financial Resource Strain: Not on file  Food Insecurity: Not on file  Transportation Needs: Not on file  Physical Activity: Not on file  Stress: Not on file  Social Connections: Not on file  Intimate Partner Violence: Not on file    Review of Systems: All negative except as stated above in HPI.  Physical Exam: Vital signs: Vitals:   04/23/21 0600 04/23/21 0630  BP: (!) 118/56 (!) 103/50  Pulse: 76 75  Resp: 15 15  Temp:    SpO2: 96% 97%     General:   Alert,  Well-developed, well-nourished, pleasant and cooperative in NAD HEENT : NS, AT, EOMI Lungs: No visible respiratory distress Heart:  Regular rate and rhythm; no murmurs, clicks, rubs,  or gallops. Abdomen: Soft, nontender, nondistended, bowel sounds present, no peritoneal signs Neuro : A/O X 3 Psych : Mood and affect normal Rectal:  Deferred  GI:  Lab Results: Recent Labs    04/22/21 1748 04/23/21 0408  WBC 10.2 9.2  HGB 7.2* 8.0*  HCT 24.1* 24.7*  PLT 440* 293   BMET Recent Labs    04/22/21 1748  NA 141  K 3.7  CL 101  CO2 25  GLUCOSE 167*  BUN 18  CREATININE 1.26*  CALCIUM 9.4   LFT Recent Labs    04/22/21 1748  PROT 7.2  ALBUMIN 3.2*  AST 17  ALT 9  ALKPHOS 105  BILITOT 0.8   PT/INR Recent Labs    04/22/21 1748  LABPROT 13.3  INR 1.0     Studies/Results: DG Chest Portable 1 View  Result Date: 04/22/2021 CLINICAL DATA:  Syncope, exertional shortness of breath. Diaphoresis. Nose bleed. History of asthma. EXAM: PORTABLE CHEST 1 VIEW COMPARISON:  02/01/2021 FINDINGS: Stable mild prominence of the central pulmonary arteries potentially reflecting pulmonary arterial hypertension. Atherosclerotic calcification of the aortic arch. No cardiomegaly. Emphysema is present. The  lungs appear otherwise  clear. Lower cervical plate and screw fixator. No blunting of the costophrenic angles. IMPRESSION: 1. Emphysema with prominent central pulmonary arteries suggesting secondary pulmonary arterial hypertension. 2.  Aortic Atherosclerosis (ICD10-I70.0). 3. No acute findings. Electronically Signed   By: Van Clines M.D.   On: 04/22/2021 18:32    Impression/Plan: -Melena while on Plavix and aspirin.  Patient with history of gastric and duodenal AVMs. -Peripheral arterial disease.  Last dose of Plavix yesterday morning. -Nosebleed.  Resolved now -History of esophagitis  Recommendations ---------------------------- -Start pantoprazole 40 mg IV twice daily -Continue full liquid diet.  Keep n.p.o. past midnight. -Tentative plan for EGD tomorrow depending on schedule availability.  Risks (bleeding, infection, bowel perforation that could require surgery, sedation-related changes in cardiopulmonary systems), benefits (identification and possible treatment of source of symptoms, exclusion of certain causes of symptoms), and alternatives (watchful waiting, radiographic imaging studies, empiric medical treatment)  were explained to patient/family in detail and patient wishes to proceed.     LOS: 1 day   Otis Brace  MD, FACP 04/23/2021, 8:05 AM  Contact #  [phone]

## 2021-04-23 NOTE — Progress Notes (Signed)
PROGRESS NOTE    Lacey Vega  WRU:[id number] DOB: Apr 20, 1951 DOA: 04/22/2021 PCP: Rogers Blocker, MD     Brief Narrative:  Lacey Vega is a  70 y.o. female with medical history significant for AAA s/p EVAR, PAD, type 2 diabetes, COPD/asthma, iron deficiency anemia, angiodysplasia of the small bowel, sickle cell trait, and hypothyroidism who presents with concerns of persistent nosebleed.   Patient had 2 hours of continuous nosebleed despite packing and decided to present to the ED.  She has intermittent nosebleeds but never this severe.  She is on both aspirin and Plavix. She also has noted a week of increasing shortness of breath with exertion as well as exertional fatigue.  Had dark tarry stool for a month since she was started on Plavix.   Back in Sept 2022, she underwent urgent EVAR for large infrarenal abdominal aortic aneurysm.  Repair was complicated by need for extended repair into the external iliac artery and left common femoral artery.  She shortly after developed left ischemic limb requiring thrombectomy.  Following procedure she continued to have claudication and underwent left superficial femoral artery stenting on 03/29/2021 and was started on Plavix for a month in addition to aspirin.    She also has extension GI hx with duodenal and gastric AVM requiring APC. Has chronic iron deficiency anemia due to chronic blood loss requiring both transfusion and IV iron in the past.   In the emergency department she was found to have hemoglobin of 7.2 and received 1 unit packed red blood cell.  Nosebleed stopped following Afrin.  Patient was admitted for GI bleed and symptomatic anemia.  New events last 24 hours / Subjective: Feeling well overall.  She received 1 unit packed red blood cells since admission.  Patient is seen in the emergency department.  She states that she has noted black tarry stools for the past month.  Denies any abdominal pain or nausea or vomiting.  No  chest pain or shortness of breath.  Denies any bright red blood per rectum  Assessment & Plan:   Principal Problem:   Symptomatic anemia Active Problems:   Asthma with COPD ([place])   Hypothyroidism (acquired)   History of arteriovenous malformation (AVM)   Diabetes (HCC)   AAA (abdominal aortic aneurysm)   AKI (acute kidney injury) ([place])   Epistaxis   PAD (peripheral artery disease) (HCC)   HLD (hyperlipidemia)   Sickle cell trait (HCC)    Symptomatic anemia from melena, epistaxis, history of iron deficiency anemia, history of AVM -Epistaxis has resolved -Status post 1 unit packed red blood cell.  Continue to trend H&H -GI consulted, planning for EGD 11/23 -Hold aspirin Plavix -IV PPI -N.p.o. at midnight  Peripheral artery disease with history of AAA status post repair, complicated by left ischemic limb requiring thrombectomy and left femoral artery stenting -Hold aspirin Plavix  CKD stage IIIa -Baseline creatinine 1.2 -Stable  Diabetes mellitus -Hemoglobin A1c pending  Hypothyroidism -Resume Synthroid once able to tolerate regular diet  Hyperlipidemia -Resume Lipitor once able to tolerate regular diet  DVT prophylaxis: SCDs Start: 04/22/21 2250 Code Status: Full code Family Communication: No family at bedside Disposition Plan:  Status is: Inpatient  Remains inpatient appropriate because: Further monitoring for GI bleeding, planning for EGD tomorrow      Consultants:  GI  Procedures:  None  Antimicrobials:  Anti-infectives (From admission, onward)    None        Objective: Vitals:   04/23/21 0830 04/23/21 0900  04/23/21 1100 04/23/21 1200  BP: (!) 110/54 112/83 (!) 109/51 (!) 109/50  Pulse: 71 72 69 74  Resp: 16 15 15 20   Temp:      TempSrc:      SpO2: 97% 96% 99% 100%    Intake/Output Summary (Last 24 hours) at 04/23/2021 1322 Last data filed at 04/22/2021 2355 Gross per 24 hour  Intake 325 ml  Output --  Net 325 ml   There were  no vitals filed for this visit.  Examination:  General exam: Appears calm and comfortable  Respiratory system: Clear to auscultation. Respiratory effort normal. No respiratory distress. No conversational dyspnea.  Cardiovascular system: S1 & S2 heard, RRR. No murmurs. No pedal edema. Gastrointestinal system: Abdomen is nondistended, soft and nontender. Normal bowel sounds heard. Central nervous system: Alert and oriented. No focal neurological deficits. Speech clear.  Extremities: Symmetric in appearance  Skin: No rashes, lesions or ulcers on exposed skin  Psychiatry: Judgement and insight appear normal. Mood & affect appropriate.   Data Reviewed: I have personally reviewed following labs and imaging studies  CBC: Recent Labs  Lab 04/22/21 1748 04/23/21 0408  WBC 10.2 9.2  NEUTROABS 7.3  --   HGB 7.2* 8.0*  HCT 24.1* 24.7*  MCV 84.6 84.6  PLT 440* 937   Basic Metabolic Panel: Recent Labs  Lab 04/22/21 1748  NA 141  K 3.7  CL 101  CO2 25  GLUCOSE 167*  BUN 18  CREATININE 1.26*  CALCIUM 9.4   GFR: Estimated Creatinine Clearance: 44.5 mL/min (A) (by C-G formula based on SCr of 1.26 mg/dL (H)). Liver Function Tests: Recent Labs  Lab 04/22/21 1748  AST 17  ALT 9  ALKPHOS 105  BILITOT 0.8  PROT 7.2  ALBUMIN 3.2*   No results for input(s): LIPASE, AMYLASE in the last 168 hours. No results for input(s): AMMONIA in the last 168 hours. Coagulation Profile: Recent Labs  Lab 04/22/21 1748  INR 1.0   Cardiac Enzymes: No results for input(s): CKTOTAL, CKMB, CKMBINDEX, TROPONINI in the last 168 hours. BNP (last 3 results) No results for input(s): PROBNP in the last 8760 hours. HbA1C: No results for input(s): HGBA1C in the last 72 hours. CBG: Recent Labs  Lab 04/22/21 1738  GLUCAP 128*   Lipid Profile: No results for input(s): CHOL, HDL, LDLCALC, TRIG, CHOLHDL, LDLDIRECT in the last 72 hours. Thyroid Function Tests: No results for input(s): TSH, T4TOTAL,  FREET4, T3FREE, THYROIDAB in the last 72 hours. Anemia Panel: No results for input(s): VITAMINB12, FOLATE, FERRITIN, TIBC, IRON, RETICCTPCT in the last 72 hours. Sepsis Labs: No results for input(s): PROCALCITON, LATICACIDVEN in the last 168 hours.  No results found for this or any previous visit (from the past 240 hour(s)).    Radiology Studies: DG Chest Portable 1 View  Result Date: 04/22/2021 CLINICAL DATA:  Syncope, exertional shortness of breath. Diaphoresis. Nose bleed. History of asthma. EXAM: PORTABLE CHEST 1 VIEW COMPARISON:  02/01/2021 FINDINGS: Stable mild prominence of the central pulmonary arteries potentially reflecting pulmonary arterial hypertension. Atherosclerotic calcification of the aortic arch. No cardiomegaly. Emphysema is present. The lungs appear otherwise clear. Lower cervical plate and screw fixator. No blunting of the costophrenic angles. IMPRESSION: 1. Emphysema with prominent central pulmonary arteries suggesting secondary pulmonary arterial hypertension. 2.  Aortic Atherosclerosis (ICD10-I70.0). 3. No acute findings. Electronically Signed   By: Van Clines M.D.   On: 04/22/2021 18:32      Scheduled Meds:  pantoprazole (PROTONIX) IV  40 mg Intravenous  Q12H   Continuous Infusions:   LOS: 1 day      Time spent: 25 minutes   Dessa Phi, DO [facility] 04/23/2021, 1:22 PM   Available via Epic secure chat 7am-7pm After these hours, please refer to coverage provider listed on [email or web address]

## 2021-04-23 NOTE — ED Notes (Signed)
Pt restingon stretcher with eyes closed, respirations even and unlabored. No acute changes noted. Daughter has gone home and pt attempting to sleep. Will continue to monitor.

## 2021-04-23 NOTE — Consult Note (Signed)
Referring Provider:  [facility] Primary Care Physician:  Rogers Blocker, MD Primary Gastroenterologist:  Dr. Michail Sermon  Reason for Consultation: Anemia  HPI: Lacey Vega is a 70 y.o. female with past medical history of AAA s/p EVAR which was complicated by left ischemic limb requiring thrombectomy.  She also underwent left superficial femoral artery stenting on March 29, 2021 and was started on Plavix along with aspirin.  She presented to the hospital with ongoing nosebleed.  GI is consulted for further evaluation of anemia given her history of duodenal and gastric AVMs.  She was found to have hemoglobin of 7.2 on admission down from 8.8 last month.  Mild elevated BUN at 18.  Normal LFTs.  Normal INR.  Hemoglobin improved to 8 with blood transfusion.  Patient seen and examined at bedside.  She has been having black tarry stool intermittently since starting Plavix last month.  She denies any abdominal pain, nausea or vomiting.  Started nosebleeding yesterday.  Denies seeing any bright red blood in the stool.   EGD in December 2020 by Dr. Michail Sermon showed LA grade C esophagitis at GE junction and few tiny nonbleeding duodenal AVMs.   Past Medical History:  Diagnosis Date   Anemia    Anxiety    Asthma    Asthma    Bronchitis    hx   Colon polyps    hyperplastic   Diabetes (HCC)    mild   Diabetes mellitus    DJD (degenerative joint disease), cervical    Dysphagia    history of   Dyspnea    Gallstones    GERD (gastroesophageal reflux disease)    H/O chest pain    secondary to anemia   Headache    History of arteriovenous malformation (AVM)    History of hiatal hernia    History of tobacco use    Hyperlipemia    Hypertension    Hypothyroidism    Iron deficiency anemia    Sickle cell anemia ([place])    "patient is not aware of this"   Thyroid disease    TIA (transient ischemic attack)    "was told that she possible had one"    Past Surgical History:  Procedure Laterality  Date   ABDOMINAL AORTIC ENDOVASCULAR STENT GRAFT Bilateral 01/31/2021   Procedure: ENDOVASCULAR ANEURYSM REPAIR (EVAR)Bilateral Groin Cutdown, left femoral endaterectomy with bovine patch angioplasty.;  Surgeon: Cherre Robins, MD;  Location: [facility];  Service: Vascular;  Laterality: Bilateral;   ABDOMINAL HYSTERECTOMY     ANTERIOR CERVICAL DECOMP/DISCECTOMY FUSION N/A 10/03/2016   Procedure: ANTERIOR CERVICAL DECOMPRESSION/DISCECTOMY FUSION CERVICAL THREE- CERVICAL FOUR;  Surgeon: Ashok Pall, MD;  Location: [place];  Service: Neurosurgery;  Laterality: N/A;  Right side approach   BOTOX INJECTION  08/29/2015   Procedure: BOTOX INJECTION;  Surgeon: Wilford Corner, MD;  Location: [place];  Service: Endoscopy;;   CHOLECYSTECTOMY     COLONOSCOPY  12/03/2011   Procedure: COLONOSCOPY;  Surgeon: [name], MD;  Location: [facility];  Service: Endoscopy;  Laterality: N/A;   ESOPHAGEAL MANOMETRY N/A 07/13/2017   Procedure: ESOPHAGEAL MANOMETRY (EM);  Surgeon: Wilford Corner, MD;  Location: [facility];  Service: Endoscopy;  Laterality: N/A;   ESOPHAGOGASTRODUODENOSCOPY  12/03/2011   Procedure: ESOPHAGOGASTRODUODENOSCOPY (EGD);  Surgeon: [name], MD;  Location: [facility];  Service: Endoscopy;  Laterality: N/A;   ESOPHAGOGASTRODUODENOSCOPY (EGD) WITH PROPOFOL N/A 08/29/2015   Procedure: ESOPHAGOGASTRODUODENOSCOPY (EGD) WITH PROPOFOL;  Surgeon: Wilford Corner, MD;  Location: [place];  Service: Endoscopy;  Laterality: N/A;   ESOPHAGOGASTRODUODENOSCOPY (EGD) WITH PROPOFOL N/A 05/17/2019   Procedure: ESOPHAGOGASTRODUODENOSCOPY (EGD) WITH PROPOFOL;  Surgeon: Wilford Corner, MD;  Location: [facility];  Service: Endoscopy;  Laterality: N/A;   GIVENS CAPSULE STUDY N/A 08/05/2018   Procedure: GIVENS CAPSULE STUDY;  Surgeon: Wilford Corner, MD;  Location: [place];  Service: Endoscopy;  Laterality: N/A;   HOT HEMOSTASIS  12/03/2011   Procedure: HOT HEMOSTASIS (ARGON PLASMA  COAGULATION/BICAP);  Surgeon: [name], MD;  Location: [facility];  Service: Endoscopy;  Laterality: N/A;   NECK SURGERY     PERIPHERAL VASCULAR INTERVENTION  03/29/2021   Procedure: PERIPHERAL VASCULAR INTERVENTION;  Surgeon: Cherre Robins, MD;  Location: [place] [facility];  Service: Cardiovascular;;  Lt SFA Brachial Approach   ROTATOR CUFF REPAIR     right   ROTATOR CUFF REPAIR Right 2014   THROMBECTOMY FEMORAL ARTERY Left 01/31/2021   Procedure: THROMBECTOMY POPLITEAL  ARTERY;  Surgeon: Cherre Robins, MD;  Location: [facility];  Service: Vascular;  Laterality: Left;   TONSILLECTOMY      Prior to Admission medications   Medication Sig Start Date End Date Taking? Authorizing Provider  albuterol (VENTOLIN HFA) 108 (90 Base) MCG/ACT inhaler Inhale 2 puffs into the lungs every 6 (six) hours as needed for wheezing or shortness of breath.     [provider]  aspirin EC 81 MG tablet Take 81 mg by mouth daily.    [provider]  atorvastatin (LIPITOR) 40 MG tablet Take 40 mg by mouth daily.    [provider]  carisoprodol (SOMA) 250 MG tablet Take 250 mg by mouth 2 (two) times daily as needed (cramps).  05/04/19   [provider]  CINNAMON PO Take 1 tablet by mouth daily. 1000mg     [provider]  clopidogrel (PLAVIX) 75 MG tablet Take 1 tablet (75 mg total) by mouth daily. 03/29/21 03/29/22  Cherre Robins, MD  Cyanocobalamin (VITAMIN B 12) 500 MCG TABS Take 1,000 mg by mouth every morning.    [provider]  docusate sodium (COLACE) 100 MG capsule Take 100 mg by mouth daily.    [provider]  donepezil (ARICEPT) 5 MG tablet Take 5 mg by mouth at bedtime.    [provider]  ferrous sulfate ([employer] IRON) 325 (65 FE) MG tablet TAKE 1 TABLET EVERY DAY . PLEASE TAKE 1 PILL DAILY WITH ORANGE JUICE OR 500 UNITS OF VITAMIN C 03/10/21   Orson Slick, MD  furosemide (LASIX) 20 MG tablet Take 20 mg by mouth daily.      [provider]  levothyroxine (SYNTHROID) 125 MCG tablet Take 125 mcg by mouth daily before breakfast.    [provider]  Magnesium Oxide 420 (252 Mg) MG TABS Take 420 mg by mouth daily.    [provider]  montelukast (SINGULAIR) 10 MG tablet Take 10 mg by mouth daily.      [provider]  Multiple Vitamins-Minerals (HAIR/SKIN/NAILS/BIOTIN PO) Take 1 tablet by mouth daily.    [provider]  nitroGLYCERIN (NITROSTAT) 0.4 MG SL tablet Place 0.4 mg under the tongue every 5 (five) minutes as needed for chest pain.    [provider]  Omega-3 Fatty Acids (FISH OIL BURP-LESS) 1200 MG CAPS Take 2 capsules by mouth daily.    [provider]  omeprazole (PRILOSEC) 40 MG capsule Take 40 mg by mouth daily.    [provider]  traMADol (  ULTRAM) 50 MG tablet Take 1 tablet (50 mg total) by mouth every 6 (six) hours as needed. 02/05/21 02/05/22  Ulyses Amor, PA-C  traMADol (ULTRAM) 50 MG tablet Take 1 tablet (50 mg total) by mouth every 6 (six) hours as needed. 03/19/21   Cherre Robins, MD    Scheduled Meds: Continuous Infusions: PRN Meds:.  Allergies as of 04/22/2021 - Review Complete 04/22/2021  Allergen Reaction Noted   Oxycodone Shortness Of Breath 03/12/2012    Family History  Problem Relation Age of Onset   Diabetes Mother    Heart attack Brother    Cancer Maternal Aunt     Social History   Socioeconomic History   Marital status: Divorced    Spouse name: Not on file   Number of children: 2   Years of education: Not on file   Highest education level: Not on file  Occupational History   Occupation: group leader environmental services    Employer: [employer]    Comment: [employer]  Tobacco Use   Smoking status: Former    Types: Cigarettes    Quit date: 06/02/2000    Years since quitting: 20.9   Smokeless tobacco: Never  Vaping Use   Vaping Use: Never used  Substance and Sexual  Activity   Alcohol use: No   Drug use: No   Sexual activity: Not on file  Other Topics Concern   Not on file  Social History Narrative   ** Merged History Encounter **       Social Determinants of Health   Financial Resource Strain: Not on file  Food Insecurity: Not on file  Transportation Needs: Not on file  Physical Activity: Not on file  Stress: Not on file  Social Connections: Not on file  Intimate Partner Violence: Not on file    Review of Systems: All negative except as stated above in HPI.  Physical Exam: Vital signs: Vitals:   04/23/21 0600 04/23/21 0630  BP: (!) 118/56 (!) 103/50  Pulse: 76 75  Resp: 15 15  Temp:    SpO2: 96% 97%     General:   Alert,  Well-developed, well-nourished, pleasant and cooperative in NAD HEENT : NS, AT, EOMI Lungs: No visible respiratory distress Heart:  Regular rate and rhythm; no murmurs, clicks, rubs,  or gallops. Abdomen: Soft, nontender, nondistended, bowel sounds present, no peritoneal signs Neuro : A/O X 3 Psych : Mood and affect normal Rectal:  Deferred  GI:  Lab Results: Recent Labs    04/22/21 1748 04/23/21 0408  WBC 10.2 9.2  HGB 7.2* 8.0*  HCT 24.1* 24.7*  PLT 440* 293   BMET Recent Labs    04/22/21 1748  NA 141  K 3.7  CL 101  CO2 25  GLUCOSE 167*  BUN 18  CREATININE 1.26*  CALCIUM 9.4   LFT Recent Labs    04/22/21 1748  PROT 7.2  ALBUMIN 3.2*  AST 17  ALT 9  ALKPHOS 105  BILITOT 0.8   PT/INR Recent Labs    04/22/21 1748  LABPROT 13.3  INR 1.0     Studies/Results: DG Chest Portable 1 View  Result Date: 04/22/2021 CLINICAL DATA:  Syncope, exertional shortness of breath. Diaphoresis. Nose bleed. History of asthma. EXAM: PORTABLE CHEST 1 VIEW COMPARISON:  02/01/2021 FINDINGS: Stable mild prominence of the central pulmonary arteries potentially reflecting pulmonary arterial hypertension. Atherosclerotic calcification of the aortic arch. No cardiomegaly. Emphysema is present. The  lungs appear otherwise  clear. Lower cervical plate and screw fixator. No blunting of the costophrenic angles. IMPRESSION: 1. Emphysema with prominent central pulmonary arteries suggesting secondary pulmonary arterial hypertension. 2.  Aortic Atherosclerosis (ICD10-I70.0). 3. No acute findings. Electronically Signed   By: Van Clines M.D.   On: 04/22/2021 18:32    Impression/Plan: -Melena while on Plavix and aspirin.  Patient with history of gastric and duodenal AVMs. -Peripheral arterial disease.  Last dose of Plavix yesterday morning. -Nosebleed.  Resolved now -History of esophagitis  Recommendations ---------------------------- -Start pantoprazole 40 mg IV twice daily -Continue full liquid diet.  Keep n.p.o. past midnight. -Tentative plan for EGD tomorrow depending on schedule availability.  Risks (bleeding, infection, bowel perforation that could require surgery, sedation-related changes in cardiopulmonary systems), benefits (identification and possible treatment of source of symptoms, exclusion of certain causes of symptoms), and alternatives (watchful waiting, radiographic imaging studies, empiric medical treatment)  were explained to patient/family in detail and patient wishes to proceed.     LOS: 1 day   Otis Brace  MD, FACP 04/23/2021, 8:05 AM  Contact #  [phone]

## 2021-04-23 NOTE — Progress Notes (Signed)
Pt arrived from ED, VSS, CHG complete, orders checked, oriented to unit, call light within reach.   Chrisandra Carota, RN 04/23/2021 2:30 PM

## 2021-04-24 ENCOUNTER — Encounter (HOSPITAL_COMMUNITY)
Admission: EM | Disposition: A | Discharge status: Home / Self Care | Payer: Self-pay | Source: Home / Self Care | Attending: Internal Medicine

## 2021-04-24 ENCOUNTER — Inpatient Hospital Stay (HOSPITAL_COMMUNITY): Payer: Medicare HMO | Admitting: Anesthesiology

## 2021-04-24 DIAGNOSIS — D649 Anemia, unspecified: Secondary | ICD-10-CM | POA: Diagnosis not present

## 2021-04-24 HISTORY — PX: ESOPHAGOGASTRODUODENOSCOPY (EGD) WITH PROPOFOL: SHX5813

## 2021-04-24 LAB — CBC
HCT: 25.7 % — ABNORMAL LOW (ref 36.0–46.0)
Hemoglobin: 8.1 g/dL — ABNORMAL LOW (ref 12.0–15.0)
MCH: 26.6 pg (ref 26.0–34.0)
MCHC: 31.5 g/dL (ref 30.0–36.0)
MCV: 84.5 fL (ref 80.0–100.0)
Platelets: 303 10*3/uL (ref 150–400)
RBC: 3.04 MIL/uL — ABNORMAL LOW (ref 3.87–5.11)
RDW: 17.6 % — ABNORMAL HIGH (ref 11.5–15.5)
WBC: 7.2 10*3/uL (ref 4.0–10.5)
nRBC: 0 % (ref 0.0–0.2)

## 2021-04-24 LAB — HEMOGLOBIN A1C
Hgb A1c MFr Bld: 4.7 % — ABNORMAL LOW (ref 4.8–5.6)
Mean Plasma Glucose: 88.19 mg/dL

## 2021-04-24 LAB — BASIC METABOLIC PANEL
Anion gap: 8 (ref 5–15)
BUN: 19 mg/dL (ref 8–23)
CO2: 27 mmol/L (ref 22–32)
Calcium: 8.7 mg/dL — ABNORMAL LOW (ref 8.9–10.3)
Chloride: 103 mmol/L (ref 98–111)
Creatinine, Ser: 1.11 mg/dL — ABNORMAL HIGH (ref 0.44–1.00)
GFR, Estimated: 53 mL/min — ABNORMAL LOW (ref >60)
Glucose, Bld: 101 mg/dL — ABNORMAL HIGH (ref 70–99)
Potassium: 4.1 mmol/L (ref 3.5–5.1)
Sodium: 138 mmol/L (ref 135–145)

## 2021-04-24 SURGERY — ESOPHAGOGASTRODUODENOSCOPY (EGD) WITH PROPOFOL
Anesthesia: Monitor Anesthesia Care

## 2021-04-24 MED ORDER — LACTATED RINGERS IV SOLN: INTRAVENOUS | Status: DC | PRN

## 2021-04-24 MED ORDER — PROPOFOL 500 MG/50ML IV EMUL
INTRAVENOUS | Status: DC | PRN
  Administered 2021-04-24: 150 ug/kg/min via INTRAVENOUS

## 2021-04-24 MED ORDER — ASPIRIN EC 81 MG PO TBEC: 81.0000 mg | DELAYED_RELEASE_TABLET | Freq: Every day | ORAL | 11 refills | Status: DC

## 2021-04-24 MED ORDER — ONDANSETRON HCL 4 MG/2ML IJ SOLN
INTRAMUSCULAR | Status: DC | PRN
  Administered 2021-04-24: 4 mg via INTRAVENOUS

## 2021-04-24 MED ORDER — PANTOPRAZOLE SODIUM 40 MG PO TBEC: DELAYED_RELEASE_TABLET | ORAL | 1 refills | Status: DC

## 2021-04-24 MED ORDER — CLOPIDOGREL BISULFATE 75 MG PO TABS: 75.0000 mg | ORAL_TABLET | Freq: Every day | ORAL | 11 refills | Status: AC

## 2021-04-24 SURGICAL SUPPLY — 15 items

## 2021-04-24 NOTE — Discharge Summary (Signed)
Physician Discharge Summary  Daneshia Tavano UUV:[id number] DOB: 10-18-1950 DOA: 04/22/2021  PCP: Rogers Blocker, MD  Admit date: 04/22/2021 Discharge date: 04/24/2021  Admitted From: Home Disposition:  Home  Recommendations for Outpatient Follow-up:  Follow up with PCP in 1 week Follow up with GI  Repeat CBC in 1 week   Discharge Condition: Stable CODE STATUS: Full  Diet recommendation: Carb modified   Brief/Interim Summary: Lacey Vega is a  70 y.o. female with medical history significant for AAA s/p EVAR, PAD, type 2 diabetes, COPD/asthma, iron deficiency anemia, angiodysplasia of the small bowel, sickle cell trait, and hypothyroidism who presents with concerns of persistent nosebleed.   Patient had 2 hours of continuous nosebleed despite packing and decided to present to the ED.  She has intermittent nosebleeds but never this severe.  She is on both aspirin and Plavix. She also has noted a week of increasing shortness of breath with exertion as well as exertional fatigue.  Had dark tarry stool for a month since she was started on Plavix.   Back in Sept 2022, she underwent urgent EVAR for large infrarenal abdominal aortic aneurysm.  Repair was complicated by need for extended repair into the external iliac artery and left common femoral artery.  She shortly after developed left ischemic limb requiring thrombectomy.  Following procedure she continued to have claudication and underwent left superficial femoral artery stenting on 03/29/2021 and was started on Plavix for a month in addition to aspirin.    She also has extension GI hx with duodenal and gastric AVM requiring APC. Has chronic iron deficiency anemia due to chronic blood loss requiring both transfusion and IV iron in the past.    In the emergency department she was found to have hemoglobin of 7.2 and received 1 unit packed red blood cell.  Nosebleed stopped following Afrin.  Patient was admitted for GI bleed and  symptomatic anemia. She underwent EGD on 11/23 which revealed moderate gastritis.   Discharge Diagnoses:  Principal Problem:   Symptomatic anemia Active Problems:   Asthma with COPD ([place])   Hypothyroidism (acquired)   History of arteriovenous malformation (AVM)   Diabetes ([place])   AAA (abdominal aortic aneurysm)   AKI (acute kidney injury) ([place])   Epistaxis   PAD (peripheral artery disease) ([facility])   HLD (hyperlipidemia)   Sickle cell trait ([facility])   Symptomatic anemia from melena, epistaxis, history of iron deficiency anemia, history of AVM -Epistaxis has resolved -Status post 1 unit packed red blood cell -GI consulted, s/p EGD 11/23 revealed gastritis with hemorrhage  -Recommend Protonix 40 mg twice a day for 4 weeks followed by Protonix 40 mg once a day. Okay to resume aspirin and Plavix after 48 hours.  Recommend to start with baby aspirin for 1 week and if no bleeding, increased to full dose aspirin if needed.  Peripheral artery disease with history of AAA status post repair, complicated by left ischemic limb requiring thrombectomy and left femoral artery stenting -Hold aspirin Plavix 48 hours   CKD stage IIIa -Baseline creatinine 1.2 -Stable   Hypothyroidism -Resume Synthroid   Hyperlipidemia -Resume Lipitor  Discharge Instructions  Discharge Instructions     Call MD for:  difficulty breathing, headache or visual disturbances   Complete by: As directed    Call MD for:  extreme fatigue   Complete by: As directed    Call MD for:  persistant dizziness or light-headedness   Complete by: As directed    Call MD for:  persistant nausea and vomiting   Complete by: As directed    Call MD for:  severe uncontrolled pain   Complete by: As directed    Call MD for:  temperature >100.4   Complete by: As directed    Discharge instructions   Complete by: As directed    [place] ON 11/25.   You were cared for by a hospitalist during your hospital stay. If you  have any questions about your discharge medications or the care you received while you were in the hospital after you are discharged, you can call the unit and ask to speak with the hospitalist on call if the hospitalist that took care of you is not available. Once you are discharged, your primary care physician will handle any further medical issues. Please note that NO REFILLS for any discharge medications will be authorized once you are discharged, as it is imperative that you return to your primary care physician (or establish a relationship with a primary care physician if you do not have one) for your aftercare needs so that they can reassess your need for medications and monitor your lab values.   Increase activity slowly   Complete by: As directed       Allergies as of 04/24/2021       Reactions   Oxycodone Shortness Of Breath        Medication List     STOP taking these medications    omeprazole 40 MG capsule Commonly known as: PRILOSEC   traMADol 50 MG tablet Commonly known as: ULTRAM       TAKE these medications    aspirin EC 81 MG tablet Take 81 mg by mouth daily.   atorvastatin 40 MG tablet Commonly known as: LIPITOR Take 40 mg by mouth daily.   carisoprodol 250 MG tablet Commonly known as: SOMA Take 250 mg by mouth 2 (two) times daily as needed (cramps).   CINNAMON PO Take 1,000 mg by mouth daily.   clopidogrel 75 MG tablet Commonly known as: Plavix Take 1 tablet (75 mg total) by mouth daily.   docusate sodium 100 MG capsule Commonly known as: COLACE Take 100 mg by mouth daily.   donepezil 5 MG tablet Commonly known as: ARICEPT Take 5 mg by mouth at bedtime.   ferrous sulfate 325 (65 FE) MG tablet Commonly known as: [employer] Iron TAKE 1 TABLET EVERY DAY . PLEASE TAKE 1 PILL DAILY WITH ORANGE JUICE OR 500 UNITS OF VITAMIN C What changed:  how much to take how to take this when to take this additional instructions   Fish Oil Burp-Less 1200 MG  Caps Take 2 capsules by mouth daily.   furosemide 20 MG tablet Commonly known as: LASIX Take 20 mg by mouth daily.   HAIR/SKIN/NAILS/BIOTIN PO Take 100 mg by mouth daily.   levothyroxine 125 MCG tablet Commonly known as: SYNTHROID Take 125 mcg by mouth daily before breakfast.   Magnesium Oxide 420 (252 Mg) MG Tabs Take 420 mg by mouth daily.   montelukast 10 MG tablet Commonly known as: SINGULAIR Take 10 mg by mouth daily.   nitroGLYCERIN 0.4 MG SL tablet Commonly known as: NITROSTAT Place 0.4 mg under the tongue every 5 (five) minutes as needed for chest pain.   pantoprazole 40 MG tablet Commonly known as: Protonix 40 mg twice a day for 4 weeks followed by Protonix 40 mg once a day.        Follow-up Information  Rogers Blocker, MD. Schedule an appointment as soon as possible for a visit in 1 week(s).   Specialty: Internal Medicine Contact information: [address] [address] [phone]         [facility], [facility] Follow up.   Why: As needed, If symptoms worsen Contact information: [address] [place] [address] [phone]                Allergies  Allergen Reactions   Oxycodone Shortness Of Breath    Consultations: GI    Procedures/Studies: PERIPHERAL VASCULAR CATHETERIZATION  Result Date: 03/29/2021 DATE OF SERVICE: 03/29/2021  PATIENT:  Lacey Vega  70 y.o. female  PRE-OPERATIVE DIAGNOSIS:  Flow limiting stenosis in left superficial femoral artery after EVAR  POST-OPERATIVE DIAGNOSIS:  Same  PROCEDURE:  1) US guided left brachial artery access 2) left lower extremity angiogram with second order cannulation (54mL total contrast) 3) left superficial femoral artery stenting (7x35mm Eluvia) 4) Conscious sedation (40 minutes)  SURGEON:  [name], MD  ASSISTANT: none  ANESTHESIA:   local and IV sedation  ESTIMATED BLOOD LOSS: min  LOCAL MEDICATIONS USED:  LIDOCAINE  COUNTS: confirmed  correct.  PATIENT DISPOSITION:  PACU - hemodynamically stable.  Delay start of Pharmacological VTE agent (>24hrs) due to surgical blood loss or risk of bleeding: no  INDICATION FOR PROCEDURE: Rozell Kettlewell is a 70 y.o. female with complex recent vascular history.  On 9 1/22 patient underwent urgent EVAR for large infrarenal abdominal aortic aneurysm.  This was complicated by need to extend repair into the external iliac artery bilaterally as well as need to cut down on the left common femoral artery because of Perclose failure.  The patient developed an ischemic limb shortly after the intervention and return to the operating room the early morning of 02/01/2021 for thrombectomy.  This restored Doppler flow to her left foot.  The patient return to clinic with claudication symptoms in her left leg.  While her ABI improved, the calf incision from her popliteal thrombectomy was not healing well. CT angiogram revealed flow limiting lesion in the proximal left superficial femoral artery. After careful discussion of risks, benefits, and alternatives the patient was offered angiography with intervention.  The patient understood and wished to proceed.  OPERATIVE FINDINGS:  Left lower extremity:             External iliac artery: Patent distal to EVAR graft. Common femoral artery: Widely patent without flow-limiting stenosis Profunda femoris artery: Widely patent without flow-limiting stenosis Superficial femoral artery: Severe stenosis about the proximal superficial femoral artery, remainder of artery widely patent Popliteal artery: Widely patent without flow-limiting stenosis Anterior tibial artery: Widely patent without flow-limiting stenosis Tibioperoneal trunk: Widely patent without flow-limiting stenosis Peroneal artery: Widely patent without flow-limiting stenosis Posterior tibial artery: Widely patent without flow-limiting stenosis  DESCRIPTION OF PROCEDURE: After identification of the patient in the  pre-operative holding area, the patient was transferred to the operating room. The patient was positioned supine on the operating room table. Anesthesia was induced. The groins was prepped and draped in standard fashion. A surgical pause was performed confirming correct patient, procedure, and operative location.  The left antecubital fossa was anesthetized with subcutaneous injection of 1% lidocaine. Using ultrasound guidance, the left brachial artery was accessed with micropuncture technique. Fluoroscopy was used to confirm cannulation over the femoral head. The 90F sheath was upsized to 47F.  A Benson wire was advanced into the subclavian  artery.  A Glidewire advantage was advanced into the subclavian artery.  The patient was systemically heparinized.  The wire and catheter were delivered into the ascending aorta and used to select the descending aorta.  The wire was navigated into the left limb of the EVAR device.  The catheter was withdrawn.  The 5 French sheath was exchanged for a 6 [place] by 90 cm sheath into the left external iliac artery.  An angiogram was performed over the left femoral head and identified the proximal superficial femoral artery lesion previously demonstrated on CT angiogram.  Lesion was crossed with a versa core wire.  Over the wire a 7 x 40 mm Olevia stent was delivered and deployed taking great care to avoid jailing the origin of the left profunda femoris artery.  The stent was postdilated with a 6 x 40 mm Mustang balloon.  Completion angiography revealed good result with resolution of the flow-limiting stenosis.  Angiography was then performed on the leg to confirm that no debris had embolized.  Normal angiogram was noted.  Catheters were removed.  The 6 [place] by 90 cm sheath was removed over the wire and exchanged for a 6 [place] by 10 cm sheath.  This was left in place to be removed in the recovery area.  Conscious sedation was administered with the use of IV fentanyl and midazolam  under continuous physician and nurse monitoring.  Heart rate, blood pressure, and oxygen saturation were continuously monitored.  Total sedation time was 40 minutes  Upon completion of the case instrument and sharps counts were confirmed correct. The patient was transferred to the PACU in good condition. I was present for all portions of the procedure.  PLAN: Aspirin 81 mg by mouth daily indefinitely.  Plavix 75 mg by mouth daily x1 month.  Lipitor 40 mg by mouth daily indefinitely.  Follow-up with me in 1 to 2 weeks with repeat ABI.  [name], MD [facility] Phone Number: [phone] 03/29/2021 8:30 AM   DG Chest Portable 1 View  Result Date: 04/22/2021 CLINICAL DATA:  Syncope, exertional shortness of breath. Diaphoresis. Nose bleed. History of asthma. EXAM: PORTABLE CHEST 1 VIEW COMPARISON:  02/01/2021 FINDINGS: Stable mild prominence of the central pulmonary arteries potentially reflecting pulmonary arterial hypertension. Atherosclerotic calcification of the aortic arch. No cardiomegaly. Emphysema is present. The lungs appear otherwise clear. Lower cervical plate and screw fixator. No blunting of the costophrenic angles. IMPRESSION: 1. Emphysema with prominent central pulmonary arteries suggesting secondary pulmonary arterial hypertension. 2.  Aortic Atherosclerosis (ICD10-I70.0). 3. No acute findings. Electronically Signed   By: Van Clines M.D.   On: 04/22/2021 18:32   VAS [place] ABI WITH/WO TBI  Result Date: 04/16/2021  LOWER EXTREMITY DOPPLER STUDY Patient Name:  Lacey Vega  Date of Exam:   04/16/2021 Medical Rec #: [id number]             Accession #:    [id number] Date of Birth: 1950-09-02            Patient Gender: F Patient Age:   23 years Exam Location:  [facility] Procedure:      VAS [place] ABI WITH/WO TBI Referring Phys: Jamelle Haring --------------------------------------------------------------------------------   Indications: Follow up left SFA stent.  Vascular Interventions: 03/29/21: Left SFA stent status post ischemic limb                         subsequent  to EVAR complication on 11/02/67. Performing Technologist: Ralene Cork RVT  Examination Guidelines: A complete evaluation includes at minimum, Doppler waveform signals and systolic blood pressure reading at the level of bilateral brachial, anterior tibial, and posterior tibial arteries, when vessel segments are accessible. Bilateral testing is considered an integral part of a complete examination. Photoelectric Plethysmograph (PPG) waveforms and toe systolic pressure readings are included as required and additional duplex testing as needed. Limited examinations for reoccurring indications may be performed as noted.  ABI Findings: +---------+------------------+-----+--------+--------+ Right    Rt Pressure (mmHg)IndexWaveformComment  +---------+------------------+-----+--------+--------+ Brachial 106                                     +---------+------------------+-----+--------+--------+ PTA      112               1.06 biphasic         +---------+------------------+-----+--------+--------+ DP       102               0.96 biphasic         +---------+------------------+-----+--------+--------+ Great Toe24                0.23                  +---------+------------------+-----+--------+--------+ +---------+------------------+-----+--------+-------+ Left     Lt Pressure (mmHg)IndexWaveformComment +---------+------------------+-----+--------+-------+ Brachial 99                                     +---------+------------------+-----+--------+-------+ PTA      104               0.98 biphasic        +---------+------------------+-----+--------+-------+ DP       76                0.72 biphasic        +---------+------------------+-----+--------+-------+ Great Toe56                0.53                  +---------+------------------+-----+--------+-------+ +-------+-----------+-----------+------------+------------+ ABI/TBIToday's ABIToday's TBIPrevious ABIPrevious TBI +-------+-----------+-----------+------------+------------+ Right  1.06       0.23       1.05        0.54         +-------+-----------+-----------+------------+------------+ Left   0.98       0.53       0.48        0.19         +-------+-----------+-----------+------------+------------+  Previous ABi on 02/26/21.  Summary: Right: Resting right ankle-brachial index is within normal range. The right toe-brachial index is abnormal. Left: Resting left ankle-brachial index is within normal range. The left toe-brachial index is abnormal.  *See table(s) above for measurements and observations.  Electronically signed by Jamelle Haring on 04/16/2021 at 4:58:18 PM.    Final        Discharge Exam: Vitals:   04/24/21 1225 04/24/21 1235  BP: (!) 121/44 (!) 114/53  Pulse: 72 71  Resp: (!) 21 15  Temp:    SpO2: 99% 98%    General: Pt is alert, awake, not in acute distress Cardiovascular: RRR, S1/S2 +, no edema Respiratory: CTA bilaterally, no wheezing, no rhonchi, no respiratory distress, no conversational dyspnea  Abdominal: Soft, NT, ND, bowel sounds +  Extremities: no edema, no cyanosis Psych: Normal mood and affect, stable judgement and insight     The results of significant diagnostics from this hospitalization (including imaging, microbiology, ancillary and laboratory) are listed below for reference.     Microbiology: Recent Results (from the past 240 hour(s))  Resp Panel by RT-PCR (Flu A&B, Covid) Nasopharyngeal Swab     Status: None   Collection Time: 04/23/21  1:44 PM   Specimen: Nasopharyngeal Swab; Nasopharyngeal(NP) swabs in vial transport medium  Result Value Ref Range Status   SARS Coronavirus 2 by RT PCR NEGATIVE NEGATIVE Final    Comment: (NOTE) SARS-CoV-2 target nucleic acids are NOT DETECTED.  The  SARS-CoV-2 RNA is generally detectable in upper respiratory specimens during the acute phase of infection. The lowest concentration of SARS-CoV-2 viral copies this assay can detect is 138 copies/mL. A negative result does not preclude SARS-Cov-2 infection and should not be used as the sole basis for treatment or other patient management decisions. A negative result may occur with  improper specimen collection/handling, submission of specimen other than nasopharyngeal swab, presence of viral mutation(s) within the areas targeted by this assay, and inadequate number of viral copies(<138 copies/mL). A negative result must be combined with clinical observations, patient history, and epidemiological information. The expected result is Negative.  Fact Sheet for Patients:  EntrepreneurPulse.com.au  Fact Sheet for Healthcare Providers:  IncredibleEmployment.be  This test is no t yet approved or cleared by the [place] FDA and  has been authorized for detection and/or diagnosis of SARS-CoV-2 by FDA under an Emergency Use Authorization (EUA). This EUA will remain  in effect (meaning this test can be used) for the duration of the COVID-19 declaration under Section 564(b)(1) of the Act, 21 U.S.C.section 360bbb-3(b)(1), unless the authorization is terminated  or revoked sooner.       Influenza A by PCR NEGATIVE NEGATIVE Final   Influenza B by PCR NEGATIVE NEGATIVE Final    Comment: (NOTE) The Xpert Xpress SARS-CoV-2/FLU/RSV plus assay is intended as an aid in the diagnosis of influenza from Nasopharyngeal swab specimens and should not be used as a sole basis for treatment. Nasal washings and aspirates are unacceptable for Xpert Xpress SARS-CoV-2/FLU/RSV testing.  Fact Sheet for Patients: EntrepreneurPulse.com.au  Fact Sheet for Healthcare Providers: IncredibleEmployment.be  This test is not yet approved or  cleared by the [place] FDA and has been authorized for detection and/or diagnosis of SARS-CoV-2 by FDA under an Emergency Use Authorization (EUA). This EUA will remain in effect (meaning this test can be used) for the duration of the COVID-19 declaration under Section 564(b)(1) of the Act, 21 U.S.C. section 360bbb-3(b)(1), unless the authorization is terminated or revoked.  Performed at [facility], [address], [place], [place] [address]      Labs: BNP (last 3 results) Recent Labs    04/22/21 1748  BNP 60.1   Basic Metabolic Panel: Recent Labs  Lab 04/22/21 1748 04/24/21 0129  NA 141 138  K 3.7 4.1  CL 101 103  CO2 25 27  GLUCOSE 167* 101*  BUN 18 19  CREATININE 1.26* 1.11*  CALCIUM 9.4 8.7*   Liver Function Tests: Recent Labs  Lab 04/22/21 1748  AST 17  ALT 9  ALKPHOS 105  BILITOT 0.8  PROT 7.2  ALBUMIN 3.2*   No results for input(s): LIPASE, AMYLASE in the last 168 hours. No results for input(s): AMMONIA in the last 168 hours. CBC: Recent Labs  Lab 04/22/21 1748 04/23/21  0408 04/23/21 1632 04/24/21 0129  WBC 10.2 9.2  --  7.2  NEUTROABS 7.3  --   --   --   HGB 7.2* 8.0* 8.3* 8.1*  HCT 24.1* 24.7* 26.3* 25.7*  MCV 84.6 84.6  --  84.5  PLT 440* 293  --  303   Cardiac Enzymes: No results for input(s): CKTOTAL, CKMB, CKMBINDEX, TROPONINI in the last 168 hours. BNP: Invalid input(s): POCBNP CBG: Recent Labs  Lab 04/22/21 1738 04/23/21 1354  GLUCAP 128* 85   D-Dimer No results for input(s): DDIMER in the last 72 hours. Hgb A1c Recent Labs    04/24/21 0129  HGBA1C 4.7*   Lipid Profile No results for input(s): CHOL, HDL, LDLCALC, TRIG, CHOLHDL, LDLDIRECT in the last 72 hours. Thyroid function studies No results for input(s): TSH, T4TOTAL, T3FREE, THYROIDAB in the last 72 hours.  Invalid input(s): FREET3 Anemia work up No results for input(s): VITAMINB12, FOLATE, FERRITIN, TIBC, IRON, RETICCTPCT in the last 72  hours. Urinalysis    Component Value Date/Time   COLORURINE YELLOW 03/27/2009 1801   APPEARANCEUR CLOUDY (A) 03/27/2009 1801   LABSPEC 1.003 (L) 03/27/2009 1801   PHURINE 6.5 03/27/2009 1801   GLUCOSEU NEGATIVE 03/27/2009 1801   HGBUR NEGATIVE 03/27/2009 1801   BILIRUBINUR NEGATIVE 03/27/2009 1801   KETONESUR NEGATIVE 03/27/2009 1801   PROTEINUR NEGATIVE 03/27/2009 1801   UROBILINOGEN 0.2 03/27/2009 1801   NITRITE NEGATIVE 03/27/2009 1801   LEUKOCYTESUR  03/27/2009 1801    NEGATIVE MICROSCOPIC NOT DONE ON URINES WITH NEGATIVE PROTEIN, BLOOD, LEUKOCYTES, NITRITE, OR GLUCOSE <1000 mg/dL.   Sepsis Labs Invalid input(s): PROCALCITONIN,  WBC,  LACTICIDVEN Microbiology Recent Results (from the past 240 hour(s))  Resp Panel by RT-PCR (Flu A&B, Covid) Nasopharyngeal Swab     Status: None   Collection Time: 04/23/21  1:44 PM   Specimen: Nasopharyngeal Swab; Nasopharyngeal(NP) swabs in vial transport medium  Result Value Ref Range Status   SARS Coronavirus 2 by RT PCR NEGATIVE NEGATIVE Final    Comment: (NOTE) SARS-CoV-2 target nucleic acids are NOT DETECTED.  The SARS-CoV-2 RNA is generally detectable in upper respiratory specimens during the acute phase of infection. The lowest concentration of SARS-CoV-2 viral copies this assay can detect is 138 copies/mL. A negative result does not preclude SARS-Cov-2 infection and should not be used as the sole basis for treatment or other patient management decisions. A negative result may occur with  improper specimen collection/handling, submission of specimen other than nasopharyngeal swab, presence of viral mutation(s) within the areas targeted by this assay, and inadequate number of viral copies(<138 copies/mL). A negative result must be combined with clinical observations, patient history, and epidemiological information. The expected result is Negative.  Fact Sheet for Patients:  EntrepreneurPulse.com.au  Fact Sheet  for Healthcare Providers:  IncredibleEmployment.be  This test is no t yet approved or cleared by the [place] FDA and  has been authorized for detection and/or diagnosis of SARS-CoV-2 by FDA under an Emergency Use Authorization (EUA). This EUA will remain  in effect (meaning this test can be used) for the duration of the COVID-19 declaration under Section 564(b)(1) of the Act, 21 U.S.C.section 360bbb-3(b)(1), unless the authorization is terminated  or revoked sooner.       Influenza A by PCR NEGATIVE NEGATIVE Final   Influenza B by PCR NEGATIVE NEGATIVE Final    Comment: (NOTE) The Xpert Xpress SARS-CoV-2/FLU/RSV plus assay is intended as an aid in the diagnosis of influenza from Nasopharyngeal swab specimens and should not  be used as a sole basis for treatment. Nasal washings and aspirates are unacceptable for Xpert Xpress SARS-CoV-2/FLU/RSV testing.  Fact Sheet for Patients: EntrepreneurPulse.com.au  Fact Sheet for Healthcare Providers: IncredibleEmployment.be  This test is not yet approved or cleared by the [place] FDA and has been authorized for detection and/or diagnosis of SARS-CoV-2 by FDA under an Emergency Use Authorization (EUA). This EUA will remain in effect (meaning this test can be used) for the duration of the COVID-19 declaration under Section 564(b)(1) of the Act, 21 U.S.C. section 360bbb-3(b)(1), unless the authorization is terminated or revoked.  Performed at [facility], [place] [address], [place], [place] [address]      Patient was seen and examined on the day of discharge and was found to be in stable condition. Time coordinating discharge: 25 minutes including assessment and coordination of care, as well as examination of the patient.   SIGNED:  Dessa Phi, DO [facility] 04/24/2021, 12:37 PM

## 2021-04-24 NOTE — Discharge Instructions (Signed)
RESUME ASPIRIN AND PLAVIX ON 11/25.

## 2021-04-24 NOTE — Anesthesia Postprocedure Evaluation (Signed)
Anesthesia Post Note  Patient: Lacey Vega  Procedure(s) Performed: ESOPHAGOGASTRODUODENOSCOPY (EGD) WITH PROPOFOL     Patient location during evaluation: PACU Anesthesia Type: MAC Level of consciousness: awake and alert Pain management: pain level controlled Vital Signs Assessment: post-procedure vital signs reviewed and stable Respiratory status: spontaneous breathing and respiratory function stable Cardiovascular status: stable Postop Assessment: no apparent nausea or vomiting Anesthetic complications: no   No notable events documented.  Last Vitals:  Vitals:   04/24/21 1225 04/24/21 1235  BP: (!) 121/44 (!) 114/53  Pulse: 72 71  Resp: (!) 21 15  Temp:    SpO2: 99% 98%    Last Pain:  Vitals:   04/24/21 1235  TempSrc:   PainSc: 0-No pain                 Byanca Kasper DANIEL

## 2021-04-24 NOTE — Interval H&P Note (Signed)
History and Physical Interval Note:  04/24/2021 11:44 AM  Lacey Vega  has presented today for surgery, with the diagnosis of Melena.  The various methods of treatment have been discussed with the patient and family. After consideration of risks, benefits and other options for treatment, the patient has consented to  Procedure(s): ESOPHAGOGASTRODUODENOSCOPY (EGD) WITH PROPOFOL (N/A) as a surgical intervention.  The patient's history has been reviewed, patient examined, no change in status, stable for surgery.  I have reviewed the patient's chart and labs.  Questions were answered to the patient's satisfaction.     [name]

## 2021-04-24 NOTE — Transfer of Care (Signed)
Immediate Anesthesia Transfer of Care Note  Patient: Lacey Vega & [employer]  Procedure(s) Performed: ESOPHAGOGASTRODUODENOSCOPY (EGD) WITH PROPOFOL  Patient Location: Endoscopy Unit  Anesthesia Type:MAC  Level of Consciousness: drowsy and patient cooperative  Airway & Oxygen Therapy: Patient Spontanous Breathing  Post-op Assessment: Report given to RN and Post -op Vital signs reviewed and stable  Post vital signs: Reviewed and stable  Last Vitals:  Vitals Value Taken Time  BP 103/43 04/24/21 1215  Temp    Pulse 75 04/24/21 1216  Resp 22 04/24/21 1216  SpO2 100 % 04/24/21 1216  Vitals shown include unvalidated device data.  Last Pain:  Vitals:   04/24/21 1215  TempSrc:   PainSc: 0-No pain         Complications: No notable events documented.

## 2021-04-24 NOTE — Progress Notes (Signed)
PROGRESS NOTE    Lacey Vega  SFK:[id number] DOB: 1950-12-05 DOA: 04/22/2021 PCP: Rogers Blocker, MD     Brief Narrative:  Lacey Vega is a  70 y.o. female with medical history significant for AAA s/p EVAR, PAD, type 2 diabetes, COPD/asthma, iron deficiency anemia, angiodysplasia of the small bowel, sickle cell trait, and hypothyroidism who presents with concerns of persistent nosebleed.   Patient had 2 hours of continuous nosebleed despite packing and decided to present to the ED.  She has intermittent nosebleeds but never this severe.  She is on both aspirin and Plavix. She also has noted a week of increasing shortness of breath with exertion as well as exertional fatigue.  Had dark tarry stool for a month since she was started on Plavix.   Back in Sept 2022, she underwent urgent EVAR for large infrarenal abdominal aortic aneurysm.  Repair was complicated by need for extended repair into the external iliac artery and left common femoral artery.  She shortly after developed left ischemic limb requiring thrombectomy.  Following procedure she continued to have claudication and underwent left superficial femoral artery stenting on 03/29/2021 and was started on Plavix for a month in addition to aspirin.    She also has extension GI hx with duodenal and gastric AVM requiring APC. Has chronic iron deficiency anemia due to chronic blood loss requiring both transfusion and IV iron in the past.   In the emergency department she was found to have hemoglobin of 7.2 and received 1 unit packed red blood cell.  Nosebleed stopped following Afrin.  Patient was admitted for GI bleed and symptomatic anemia.  New events last 24 hours / Subjective: Patient states that she had a little bit of nosebleed this morning that quickly stopped after applying pressure.  Had 1 black tarry stool last night.  No nausea, vomiting, abdominal pain.  Assessment & Plan:   Principal Problem:   Symptomatic  anemia Active Problems:   Asthma with COPD ([place])   Hypothyroidism (acquired)   History of arteriovenous malformation (AVM)   Diabetes (H[facility])   AAA (abdominal aortic aneurysm)   AKI (acute kidney injury) ([place])   Epistaxis   PAD (peripheral artery disease) (HCC)   HLD (hyperlipidemia)   Sickle cell trait (HCC)    Symptomatic anemia from melena, epistaxis, history of iron deficiency anemia, history of AVM -Epistaxis has resolved -Status post 1 unit packed red blood cell.  Continue to trend H&H -GI consulted, planning for EGD 11/23 -Hold aspirin Plavix -IV PPI  Peripheral artery disease with history of AAA status post repair, complicated by left ischemic limb requiring thrombectomy and left femoral artery stenting -Hold aspirin Plavix  CKD stage IIIa -Baseline creatinine 1.2 -Stable  Hypothyroidism -Resume Synthroid once able to tolerate regular diet  Hyperlipidemia -Resume Lipitor once able to tolerate regular diet  DVT prophylaxis: SCDs Start: 04/22/21 2250 Code Status: Full code Family Communication: No family at bedside Disposition Plan:  Status is: Inpatient  Remains inpatient appropriate because: Further monitoring for GI bleeding, planning for EGD today      Consultants:  GI  Procedures:  None  Antimicrobials:  Anti-infectives (From admission, onward)    None        Objective: Vitals:   04/23/21 1943 04/23/21 2326 04/24/21 0400 04/24/21 0733  BP: (!) 120/56 (!) 113/55 (!) 90/57 115/66  Pulse: 84 77 82 77  Resp: 19 20 20 20   Temp: 98.6 F (37 C) 98 F (36.7 C) 98.1 F (36.7  C) 98.3 F (36.8 C)  TempSrc: Oral Oral Oral Oral  SpO2: 100% 100% 100% 100%  Weight:      Height:        Intake/Output Summary (Last 24 hours) at 04/24/2021 0933 Last data filed at 04/24/2021 0255 Gross per 24 hour  Intake 160.01 ml  Output --  Net 160.01 ml    Filed Weights   04/23/21 1428  Weight: 76.2 kg   Examination: General exam: Appears calm and  comfortable  Respiratory system: Clear to auscultation. Respiratory effort normal. Cardiovascular system: S1 & S2 heard, RRR. No pedal edema. Gastrointestinal system: Abdomen is nondistended, soft and nontender. Normal bowel sounds heard. Central nervous system: Alert and oriented. Non focal exam. Speech clear  Extremities: Symmetric in appearance bilaterally  Skin: No rashes, lesions or ulcers on exposed skin  Psychiatry: Judgement and insight appear stable. Mood & affect appropriate.    Data Reviewed: I have personally reviewed following labs and imaging studies  CBC: Recent Labs  Lab 04/22/21 1748 04/23/21 0408 04/23/21 1632 04/24/21 0129  WBC 10.2 9.2  --  7.2  NEUTROABS 7.3  --   --   --   HGB 7.2* 8.0* 8.3* 8.1*  HCT 24.1* 24.7* 26.3* 25.7*  MCV 84.6 84.6  --  84.5  PLT 440* 293  --  403    Basic Metabolic Panel: Recent Labs  Lab 04/22/21 1748 04/24/21 0129  NA 141 138  K 3.7 4.1  CL 101 103  CO2 25 27  GLUCOSE 167* 101*  BUN 18 19  CREATININE 1.26* 1.11*  CALCIUM 9.4 8.7*    GFR: Estimated Creatinine Clearance: 49.5 mL/min (A) (by C-G formula based on SCr of 1.11 mg/dL (H)). Liver Function Tests: Recent Labs  Lab 04/22/21 1748  AST 17  ALT 9  ALKPHOS 105  BILITOT 0.8  PROT 7.2  ALBUMIN 3.2*    No results for input(s): LIPASE, AMYLASE in the last 168 hours. No results for input(s): AMMONIA in the last 168 hours. Coagulation Profile: Recent Labs  Lab 04/22/21 1748  INR 1.0    Cardiac Enzymes: No results for input(s): CKTOTAL, CKMB, CKMBINDEX, TROPONINI in the last 168 hours. BNP (last 3 results) No results for input(s): PROBNP in the last 8760 hours. HbA1C: Recent Labs    04/24/21 0129  HGBA1C 4.7*   CBG: Recent Labs  Lab 04/22/21 1738 04/23/21 1354  GLUCAP 128* 85    Lipid Profile: No results for input(s): CHOL, HDL, LDLCALC, TRIG, CHOLHDL, LDLDIRECT in the last 72 hours. Thyroid Function Tests: No results for input(s): TSH,  T4TOTAL, FREET4, T3FREE, THYROIDAB in the last 72 hours. Anemia Panel: No results for input(s): VITAMINB12, FOLATE, FERRITIN, TIBC, IRON, RETICCTPCT in the last 72 hours. Sepsis Labs: No results for input(s): PROCALCITON, LATICACIDVEN in the last 168 hours.  Recent Results (from the past 240 hour(s))  Resp Panel by RT-PCR (Flu A&B, Covid) Nasopharyngeal Swab     Status: None   Collection Time: 04/23/21  1:44 PM   Specimen: Nasopharyngeal Swab; Nasopharyngeal(NP) swabs in vial transport medium  Result Value Ref Range Status   SARS Coronavirus 2 by RT PCR NEGATIVE NEGATIVE Final    Comment: (NOTE) SARS-CoV-2 target nucleic acids are NOT DETECTED.  The SARS-CoV-2 RNA is generally detectable in upper respiratory specimens during the acute phase of infection. The lowest concentration of SARS-CoV-2 viral copies this assay can detect is 138 copies/mL. A negative result does not preclude SARS-Cov-2 infection and should not be used as  the sole basis for treatment or other patient management decisions. A negative result may occur with  improper specimen collection/handling, submission of specimen other than nasopharyngeal swab, presence of viral mutation(s) within the areas targeted by this assay, and inadequate number of viral copies(<138 copies/mL). A negative result must be combined with clinical observations, patient history, and epidemiological information. The expected result is Negative.  Fact Sheet for Patients:  EntrepreneurPulse.com.au  Fact Sheet for Healthcare Providers:  IncredibleEmployment.be  This test is no t yet approved or cleared by the [place] FDA and  has been authorized for detection and/or diagnosis of SARS-CoV-2 by FDA under an Emergency Use Authorization (EUA). This EUA will remain  in effect (meaning this test can be used) for the duration of the COVID-19 declaration under Section 564(b)(1) of the Act, 21 U.S.C.section  360bbb-3(b)(1), unless the authorization is terminated  or revoked sooner.       Influenza A by PCR NEGATIVE NEGATIVE Final   Influenza B by PCR NEGATIVE NEGATIVE Final    Comment: (NOTE) The Xpert Xpress SARS-CoV-2/FLU/RSV plus assay is intended as an aid in the diagnosis of influenza from Nasopharyngeal swab specimens and should not be used as a sole basis for treatment. Nasal washings and aspirates are unacceptable for Xpert Xpress SARS-CoV-2/FLU/RSV testing.  Fact Sheet for Patients: EntrepreneurPulse.com.au  Fact Sheet for Healthcare Providers: IncredibleEmployment.be  This test is not yet approved or cleared by the [place] FDA and has been authorized for detection and/or diagnosis of SARS-CoV-2 by FDA under an Emergency Use Authorization (EUA). This EUA will remain in effect (meaning this test can be used) for the duration of the COVID-19 declaration under Section 564(b)(1) of the Act, 21 U.S.C. section 360bbb-3(b)(1), unless the authorization is terminated or revoked.  Performed at [facility], [place] [address], [place], [place] [address]       Radiology Studies: DG Chest Portable 1 View  Result Date: 04/22/2021 CLINICAL DATA:  Syncope, exertional shortness of breath. Diaphoresis. Nose bleed. History of asthma. EXAM: PORTABLE CHEST 1 VIEW COMPARISON:  02/01/2021 FINDINGS: Stable mild prominence of the central pulmonary arteries potentially reflecting pulmonary arterial hypertension. Atherosclerotic calcification of the aortic arch. No cardiomegaly. Emphysema is present. The lungs appear otherwise clear. Lower cervical plate and screw fixator. No blunting of the costophrenic angles. IMPRESSION: 1. Emphysema with prominent central pulmonary arteries suggesting secondary pulmonary arterial hypertension. 2.  Aortic Atherosclerosis (ICD10-I70.0). 3. No acute findings. Electronically Signed   By: Van Clines M.D.   On:  04/22/2021 18:32      Scheduled Meds:  pantoprazole (PROTONIX) IV  40 mg Intravenous Q12H   Continuous Infusions:  sodium chloride 20 mL/hr at 04/24/21 0601     LOS: 2 days      Time spent: 25 minutes   Dessa Phi, DO [facility] 04/24/2021, 9:33 AM   Available via Epic secure chat 7am-7pm After these hours, please refer to coverage provider listed on [email or web address]

## 2021-04-24 NOTE — Op Note (Signed)
[facility] Patient Name: Lacey Vega Procedure Date : 04/24/2021 MRN: [id number] Attending MD: Otis Brace , MD Date of Birth: [birth year] CSN: [id number] Age: 70 Admit Type: Inpatient Procedure:                Upper GI endoscopy Indications:              Melena Providers:                Otis Brace, MD, Particia Nearing, RN, Tyna Jaksch Technician Referring MD:              Medicines:                Sedation Administered by an Anesthesia Professional Complications:            No immediate complications. Estimated Blood Loss:     Estimated blood loss was minimal. Procedure:                Pre-Anesthesia Assessment:                           - Prior to the procedure, a History and Physical                            was performed, and patient medications and                            allergies were reviewed. The patient's tolerance of                            previous anesthesia was also reviewed. The risks                            and benefits of the procedure and the sedation                            options and risks were discussed with the patient.                            All questions were answered, and informed consent                            was obtained. Prior Anticoagulants: The patient                            last took Plavix (clopidogrel) 2 days prior to the                            procedure. ASA Grade Assessment: III - A patient                            with severe systemic disease. After reviewing the  risks and benefits, the patient was deemed in                            satisfactory condition to undergo the procedure.                           After obtaining informed consent, the endoscope was                            passed under direct vision. Throughout the                            procedure, the patient's blood pressure, pulse, and                             oxygen saturations were monitored continuously. The                            [id number]-[id number] ([id number]) Olympus endoscope was introduced                            through the mouth, and advanced to the third part                            of duodenum. The upper GI endoscopy was                            accomplished without difficulty. The patient                            tolerated the procedure well. Scope In: Scope Out: Findings:      The Z-line was regular and was found 40 cm from the incisors.      Patchy moderate inflammation with hemorrhage characterized by congestion       (edema), erosions and erythema was found in the gastric antrum and in       the prepyloric region of the stomach.      The cardia and gastric fundus were normal on retroflexion.      The duodenal bulb, first portion of the duodenum, second portion of the       duodenum and third portion of the duodenum were normal. Impression:               - Z-line regular, 40 cm from the incisors.                           - Gastritis with hemorrhage.                           - Normal duodenal bulb, first portion of the                            duodenum, second portion of the duodenum and third  portion of the duodenum.                           - No specimens collected. Recommendation:           - Return patient to hospital ward for ongoing care.                           - Resume previous diet.                           - Continue present medications. Procedure Code(s):        --- Professional ---                           [id number], Esophagogastroduodenoscopy, flexible,                            transoral; diagnostic, including collection of                            specimen(s) by brushing or washing, when performed                            (separate procedure) Diagnosis Code(s):        --- Professional ---                           K29.71, Gastritis, unspecified, with bleeding                            K92.1, Melena (includes Hematochezia) CPT copyright 2019 American Medical Association. All rights reserved. The codes documented in this report are preliminary and upon coder review may  be revised to meet current compliance requirements. Otis Brace, MD Otis Brace, MD 04/24/2021 12:19:37 PM Number of Addenda: 0

## 2021-04-24 NOTE — Anesthesia Preprocedure Evaluation (Addendum)
Anesthesia Evaluation  Patient identified by MRN, date of birth, ID band Patient awake    Reviewed: Allergy & Precautions, H&P , NPO status , Patient's Chart, lab work & pertinent test results  History of Anesthesia Complications Negative for: history of anesthetic complications  Airway Mallampati: I  TM Distance: >3 FB Neck ROM: Full    Dental  (+) Edentulous Upper, Edentulous Lower, Dental Advisory Given   Pulmonary shortness of breath, asthma , COPD, former smoker,  COVID+   Pulmonary exam normal        Cardiovascular hypertension, Normal cardiovascular exam     Neuro/Psych  Headaches, PSYCHIATRIC DISORDERS Anxiety    GI/Hepatic GERD  ,  Endo/Other  diabetes, Type 2Hypothyroidism   Renal/GU      Musculoskeletal  (+) Arthritis ,   Abdominal   Peds  Hematology  (+) Blood dyscrasia, Sickle cell anemia and anemia ,   Anesthesia Other Findings   Reproductive/Obstetrics                            Anesthesia Physical  Anesthesia Plan  ASA: 3  Anesthesia Plan: MAC   Post-op Pain Management:    Induction: Intravenous  PONV Risk Score and Plan: 2 and Ondansetron and Propofol infusion  Airway Management Planned: Natural Airway and Simple Face Mask  Additional Equipment:   Intra-op Plan:   Post-operative Plan:   Informed Consent: I have reviewed the patients History and Physical, chart, labs and discussed the procedure including the risks, benefits and alternatives for the proposed anesthesia with the patient or authorized representative who has indicated his/her understanding and acceptance.     Dental advisory given  Plan Discussed with: Anesthesiologist and CRNA  Anesthesia Plan Comments:        Anesthesia Quick Evaluation

## 2021-04-24 NOTE — Brief Op Note (Addendum)
04/22/2021 - 04/24/2021  12:20 PM  PATIENT:  Lacey Vega  70 y.o. female  PRE-OPERATIVE DIAGNOSIS:  Melena  POST-OPERATIVE DIAGNOSIS:  Gastritis  PROCEDURE:  Procedure(s): ESOPHAGOGASTRODUODENOSCOPY (EGD) WITH PROPOFOL (N/A)  SURGEON:  Surgeon(s) and Role:    * Laylah Riga, MD - Primary  Findings ----------- -EGD showed moderate gastritis without any active bleeding.   Recommendations ------------------------ -Recommend Protonix 40 mg twice a day for 4 weeks followed by Protonix 40 mg once a day. -Okay to resume aspirin and Plavix after 48 hours.  Recommend to start with baby aspirin for 1 week and if no bleeding, increased to full dose aspirin if needed. -No further inpatient GI work-up planned. -Patient wants to go home for Thanksgiving.  Okay to discharge from GI standpoint. -GI will sign off.  Call us back if needed  Otis Brace MD, FACP 04/24/2021, 12:21 PM  Contact #  [phone]

## 2021-04-25 LAB — BPAM RBC
Blood Product Expiration Date: 202212262359
Blood Product Expiration Date: 202212282359
ISSUE DATE / TIME: 202211212051
Unit Type and Rh: 600
Unit Type and Rh: 6200

## 2021-04-25 LAB — TYPE AND SCREEN
ABO/RH(D): A POS
Antibody Screen: NEGATIVE
Donor AG Type: NEGATIVE
Donor AG Type: NEGATIVE
Unit division: 0
Unit division: 0

## 2021-04-28 ENCOUNTER — Encounter (HOSPITAL_COMMUNITY): Payer: Self-pay | Admitting: Gastroenterology

## 2021-05-08 ENCOUNTER — Other Ambulatory Visit: Payer: Self-pay

## 2021-05-08 ENCOUNTER — Other Ambulatory Visit: Payer: Self-pay | Admitting: Hematology and Oncology

## 2021-05-08 ENCOUNTER — Inpatient Hospital Stay: Payer: Medicare HMO | Attending: Hematology and Oncology | Admitting: Hematology and Oncology

## 2021-05-08 ENCOUNTER — Other Ambulatory Visit: Payer: Self-pay | Admitting: *Deleted

## 2021-05-08 ENCOUNTER — Inpatient Hospital Stay: Payer: Medicare HMO

## 2021-05-08 VITALS — BP 148/53 | HR 107 | Temp 97.5°F | Resp 18 | Wt 175.2 lb

## 2021-05-08 DIAGNOSIS — E119 Type 2 diabetes mellitus without complications: Secondary | ICD-10-CM | POA: Insufficient documentation

## 2021-05-08 DIAGNOSIS — D500 Iron deficiency anemia secondary to blood loss (chronic): Secondary | ICD-10-CM

## 2021-05-08 DIAGNOSIS — D573 Sickle-cell trait: Secondary | ICD-10-CM | POA: Insufficient documentation

## 2021-05-08 DIAGNOSIS — Z809 Family history of malignant neoplasm, unspecified: Secondary | ICD-10-CM | POA: Insufficient documentation

## 2021-05-08 DIAGNOSIS — I10 Essential (primary) hypertension: Secondary | ICD-10-CM | POA: Insufficient documentation

## 2021-05-08 LAB — CMP (CANCER CENTER ONLY)
ALT: 9 U/L (ref 0–44)
AST: 15 U/L (ref 15–41)
Albumin: 3.2 g/dL — ABNORMAL LOW (ref 3.5–5.0)
Alkaline Phosphatase: 97 U/L (ref 38–126)
Anion gap: 11 (ref 5–15)
BUN: 20 mg/dL (ref 8–23)
CO2: 24 mmol/L (ref 22–32)
Calcium: 8.7 mg/dL — ABNORMAL LOW (ref 8.9–10.3)
Chloride: 109 mmol/L (ref 98–111)
Creatinine: 1.08 mg/dL — ABNORMAL HIGH (ref 0.44–1.00)
GFR, Estimated: 55 mL/min — ABNORMAL LOW (ref >60)
Glucose, Bld: 102 mg/dL — ABNORMAL HIGH (ref 70–99)
Potassium: 4 mmol/L (ref 3.5–5.1)
Sodium: 144 mmol/L (ref 135–145)
Total Bilirubin: 0.6 mg/dL (ref 0.3–1.2)
Total Protein: 7 g/dL (ref 6.5–8.1)

## 2021-05-08 LAB — CBC WITH DIFFERENTIAL (CANCER CENTER ONLY)
Abs Immature Granulocytes: 0.02 10*3/uL (ref 0.00–0.07)
Basophils Absolute: 0 10*3/uL (ref 0.0–0.1)
Basophils Relative: 0 %
Eosinophils Absolute: 0.2 10*3/uL (ref 0.0–0.5)
Eosinophils Relative: 3 %
HCT: 20.2 % — ABNORMAL LOW (ref 36.0–46.0)
Hemoglobin: 6.3 g/dL — CL (ref 12.0–15.0)
Immature Granulocytes: 0 %
Lymphocytes Relative: 17 %
Lymphs Abs: 1.2 10*3/uL (ref 0.7–4.0)
MCH: 26.4 pg (ref 26.0–34.0)
MCHC: 31.2 g/dL (ref 30.0–36.0)
MCV: 84.5 fL (ref 80.0–100.0)
Monocytes Absolute: 0.5 10*3/uL (ref 0.1–1.0)
Monocytes Relative: 8 %
Neutro Abs: 5.1 10*3/uL (ref 1.7–7.7)
Neutrophils Relative %: 72 %
Platelet Count: 349 10*3/uL (ref 150–400)
RBC: 2.39 MIL/uL — ABNORMAL LOW (ref 3.87–5.11)
RDW: 17.5 % — ABNORMAL HIGH (ref 11.5–15.5)
WBC Count: 7.1 10*3/uL (ref 4.0–10.5)
nRBC: 0.3 % — ABNORMAL HIGH (ref 0.0–0.2)

## 2021-05-08 LAB — PREPARE RBC (CROSSMATCH)

## 2021-05-08 LAB — RETIC PANEL
Immature Retic Fract: 32.6 % — ABNORMAL HIGH (ref 2.3–15.9)
RBC.: 2.39 MIL/uL — ABNORMAL LOW (ref 3.87–5.11)
Retic Count, Absolute: 113 10*3/uL (ref 19.0–186.0)
Retic Ct Pct: 4.7 % — ABNORMAL HIGH (ref 0.4–3.1)
Reticulocyte Hemoglobin: 23.4 pg — ABNORMAL LOW (ref >27.9)

## 2021-05-08 LAB — SAMPLE TO BLOOD BANK

## 2021-05-08 NOTE — Progress Notes (Signed)
[place] [phone]   [phone]  PROGRESS NOTE  Patient Care Team: Rogers Blocker, MD as PCP - General (Internal Medicine)  Hematological/Oncological History # Iron Deficiency Anemia due to Chronic Blood Loss # Sickle Cell Trait  1) 03/26/2009: Hgb 7.2, first Hgb on record 2) 09/02/2011: 2 units of PRBC at [facility] (Hgb noted at 8.9).  3)  12/03/2011: colonoscopy, found colon polyp and sigmoid diverticula. 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 Underwent argon plasma coagulation  4) Jan 2018: colonoscopy with 2 tubular adenomas removed. Small colonic AVM and small internal hemorrhoids.  5)10/24/2017: Hgb 7.7, MCV 71, Plt 338, and WBC 6.7. POEM procedure for Achalasia. duodenal AVM fulgurated.  6) 03/2018: Hgb 8.2.  7) March 2020: capsule endoscopy: nonbleeding gastric and proximal small bowel AVM,  8) 04/26/2019: Establish Care with Dr. Lorenso Courier  WBC 6.1, Hgb 9.3, MCV 75.3, Plt 289 9) 05/17/2019: Underwent EGD which showed esophagitis and small non-bleeding duodenal angiodysplasias. 10) 07/27/2019: WBC 5.9, Hgb 9.8, MCV 77.8, Plt 271. Recommend IV iron based on labs. Received feraheme 510mg  IV q7 days x 2 doses on 3/8 and 08/15/2019. Hgb electrophoresis confirms sickle cell trait.  11) 10/24/2019: WBC 5.4, Hgb 10.2, MCV 80.5, Plt 252 12) 02/15/2020: WBC 5.1, Hgb 9.7, MCV 80.2, Plt 280 13) 9/24-10/06/2019: IV feraheme 510mg  x 2 doses 14) 04/22/2021: Patient presented to emergency department with heavy nosebleeding.  Had a syncopal episode in the emergency department.  Discharged on 04/24/2021. Hgb 7.2 on admission.   Interval History:  Lacey Vega 70 y.o. female with medical history significant for iron deficiency anemia 2/2 to chronic blood loss/ sickle cell trait who presents for a follow up visit. The patient's last visit was on 11/05/2020. In the interim since the last visit Lacey Vega was admitted from 04/22/2021 to 04/24/2021 for  persistent nose bleed.   On exam today [name] notes she was having very heavy nosebleeds the day before she was admitted to the emergency department on 04/22/2021.  She notes that she has never had nosebleeds that heavy.  She reports that since discharge from the hospital she has had no further episodes of bleeding.  She also denies any other sources of bleeding such as bleeding, or blood in her stools.  She reports that she continues to take iron pills though is currently due for refill.  She reports that she has no difficulty with the medications and takes them faithfully.  She does that she is having difficulties with fatigue but no dizziness, shortness of breath, or chest pain.  She has had no issues with fevers, chills, sweats, nausea, vomiting or diarrhea.  A full 10 point ROS is listed below.  MEDICAL HISTORY:  Past Medical History:  Diagnosis Date   Anemia    Anxiety    Asthma    Asthma    Bronchitis    hx   Colon polyps    hyperplastic   Diabetes ([place])    mild   Diabetes mellitus    DJD (degenerative joint disease), cervical    Dysphagia    history of   Dyspnea    Gallstones    GERD (gastroesophageal reflux disease)    H/O chest pain    secondary to anemia   Headache    History of arteriovenous malformation (AVM)    History of hiatal hernia    History of tobacco use    Hyperlipemia    Hypertension    Hypothyroidism  Iron deficiency anemia    Sickle cell anemia (HCC)    "patient is not aware of this"   Thyroid disease    TIA (transient ischemic attack)    "was told that she possible had one"    SURGICAL HISTORY: Past Surgical History:  Procedure Laterality Date   ABDOMINAL AORTIC ENDOVASCULAR STENT GRAFT Bilateral 01/31/2021   Procedure: ENDOVASCULAR ANEURYSM REPAIR (EVAR)Bilateral Groin Cutdown, left femoral endaterectomy with bovine patch angioplasty.;  Surgeon: Cherre Robins, MD;  Location: [place];  Service: Vascular;  Laterality: Bilateral;   ABDOMINAL  HYSTERECTOMY     ANTERIOR CERVICAL DECOMP/DISCECTOMY FUSION N/A 10/03/2016   Procedure: ANTERIOR CERVICAL DECOMPRESSION/DISCECTOMY FUSION CERVICAL THREE- CERVICAL FOUR;  Surgeon: Ashok Pall, MD;  Location: [place];  Service: Neurosurgery;  Laterality: N/A;  Right side approach   BOTOX INJECTION  08/29/2015   Procedure: BOTOX INJECTION;  Surgeon: Wilford Corner, MD;  Location: [place];  Service: Endoscopy;;   CHOLECYSTECTOMY     COLONOSCOPY  12/03/2011   Procedure: COLONOSCOPY;  Surgeon: [name], MD;  Location: [facility];  Service: Endoscopy;  Laterality: N/A;   ESOPHAGEAL MANOMETRY N/A 07/13/2017   Procedure: ESOPHAGEAL MANOMETRY (EM);  Surgeon: Wilford Corner, MD;  Location: [facility];  Service: Endoscopy;  Laterality: N/A;   ESOPHAGOGASTRODUODENOSCOPY  12/03/2011   Procedure: ESOPHAGOGASTRODUODENOSCOPY (EGD);  Surgeon: [name], MD;  Location: [facility];  Service: Endoscopy;  Laterality: N/A;   ESOPHAGOGASTRODUODENOSCOPY (EGD) WITH PROPOFOL N/A 08/29/2015   Procedure: ESOPHAGOGASTRODUODENOSCOPY (EGD) WITH PROPOFOL;  Surgeon: Wilford Corner, MD;  Location: [facility];  Service: Endoscopy;  Laterality: N/A;   ESOPHAGOGASTRODUODENOSCOPY (EGD) WITH PROPOFOL N/A 05/17/2019   Procedure: ESOPHAGOGASTRODUODENOSCOPY (EGD) WITH PROPOFOL;  Surgeon: Wilford Corner, MD;  Location: [facility];  Service: Endoscopy;  Laterality: N/A;   ESOPHAGOGASTRODUODENOSCOPY (EGD) WITH PROPOFOL N/A 04/24/2021   Procedure: ESOPHAGOGASTRODUODENOSCOPY (EGD) WITH PROPOFOL;  Surgeon: Otis Brace, MD;  Location: [place];  Service: Gastroenterology;  Laterality: N/A;   GIVENS CAPSULE STUDY N/A 08/05/2018   Procedure: GIVENS CAPSULE STUDY;  Surgeon: Wilford Corner, MD;  Location: [place];  Service: Endoscopy;  Laterality: N/A;   HOT HEMOSTASIS  12/03/2011   Procedure: HOT HEMOSTASIS (ARGON PLASMA COAGULATION/BICAP);  Surgeon: [name], MD;  Location: [facility];  Service: Endoscopy;  Laterality: N/A;   NECK SURGERY     PERIPHERAL VASCULAR INTERVENTION  03/29/2021   Procedure: PERIPHERAL VASCULAR INTERVENTION;  Surgeon: Cherre Robins, MD;  Location: [facility];  Service: Cardiovascular;;  Lt SFA Brachial Approach   ROTATOR CUFF REPAIR     right   ROTATOR CUFF REPAIR Right 2014   THROMBECTOMY FEMORAL ARTERY Left 01/31/2021   Procedure: THROMBECTOMY POPLITEAL  ARTERY;  Surgeon: Cherre Robins, MD;  Location: [facility];  Service: Vascular;  Laterality: Left;   TONSILLECTOMY      SOCIAL HISTORY: Social History   Socioeconomic History   Marital status: Divorced    Spouse name: Not on file   Number of children: 2   Years of education: Not on file   Highest education level: Not on file  Occupational History   Occupation: group leader environmental services    Employer: [employer]    Comment: [employer]  Tobacco Use   Smoking status: Former    Types: Cigarettes    Quit date: 06/02/2000    Years since quitting: 20.9   Smokeless tobacco: Never  Vaping Use   Vaping Use: Never used  Substance and Sexual Activity  Alcohol use: No   Drug use: No   Sexual activity: Not on file  Other Topics Concern   Not on file  Social History Narrative   ** Merged History Encounter **       Social Determinants of Health   Financial Resource Strain: Not on file  Food Insecurity: Not on file  Transportation Needs: Not on file  Physical Activity: Not on file  Stress: Not on file  Social Connections: Not on file  Intimate Partner Violence: Not on file    FAMILY HISTORY: Family History  Problem Relation Age of Onset   Diabetes Mother    Heart attack Brother    Cancer Maternal Aunt     ALLERGIES:  is allergic to oxycodone.  MEDICATIONS:  Current Outpatient Medications  Medication Sig Dispense Refill   aspirin EC 81 MG tablet Take 1 tablet (81 mg total) by mouth daily. **Start on 11/25** 30 tablet 11    atorvastatin (LIPITOR) 40 MG tablet Take 40 mg by mouth daily.     carisoprodol (SOMA) 250 MG tablet Take 250 mg by mouth 2 (two) times daily as needed (cramps).      CINNAMON PO Take 1,000 mg by mouth daily.     clopidogrel (PLAVIX) 75 MG tablet Take 1 tablet (75 mg total) by mouth daily. **Start on 11/25** 30 tablet 11   docusate sodium (COLACE) 100 MG capsule Take 100 mg by mouth daily.     donepezil (ARICEPT) 5 MG tablet Take 5 mg by mouth at bedtime.     ferrous sulfate ([employer] IRON) 325 (65 FE) MG tablet TAKE 1 TABLET EVERY DAY . PLEASE TAKE 1 PILL DAILY WITH ORANGE JUICE OR 500 UNITS OF VITAMIN C (Patient taking differently: Take 325 mg by mouth daily with breakfast. PLEASE TAKE 1 PILL DAILY WITH ORANGE JUICE OR 500 UNITS OF VITAMIN C) 90 tablet 1   furosemide (LASIX) 20 MG tablet Take 20 mg by mouth daily.      levothyroxine (SYNTHROID) 125 MCG tablet Take 125 mcg by mouth daily before breakfast.     Magnesium Oxide 420 (252 Mg) MG TABS Take 420 mg by mouth daily.     montelukast (SINGULAIR) 10 MG tablet Take 10 mg by mouth daily.       Multiple Vitamins-Minerals (HAIR/SKIN/NAILS/BIOTIN PO) Take 100 mg by mouth daily.     nitroGLYCERIN (NITROSTAT) 0.4 MG SL tablet Place 0.4 mg under the tongue every 5 (five) minutes as needed for chest pain.     Omega-3 Fatty Acids (FISH OIL BURP-LESS) 1200 MG CAPS Take 2 capsules by mouth daily.     pantoprazole (PROTONIX) 40 MG tablet 40 mg twice a day for 4 weeks followed by Protonix 40 mg once a day. 60 tablet 1   No current facility-administered medications for this visit.   Facility-Administered Medications Ordered in Other Visits  Medication Dose Route Frequency Provider Last Rate Last Admin   0.9 %  sodium chloride infusion   Intravenous Continuous Wilford Corner, MD        REVIEW OF SYSTEMS:   Constitutional: ( - ) fevers, ( - )  chills , ( - ) night sweats (+) fatigue Eyes: ( - ) blurriness of vision, ( - ) double vision, ( - ) watery  eyes Ears, nose, mouth, throat, and face: ( - ) mucositis, ( - ) sore throat Respiratory: ( - ) cough, ( - ) dyspnea, ( - ) wheezes Cardiovascular: ( - ) palpitation, ( - )  chest discomfort, ( - ) lower extremity swelling Gastrointestinal:  ( - ) nausea, ( - ) heartburn, ( - ) change in bowel habits Skin: ( - ) abnormal skin rashes Lymphatics: ( - ) new lymphadenopathy, ( - ) easy bruising Neurological: ( - ) numbness, ( - ) tingling, ( - ) new weaknesses Behavioral/Psych: ( - ) mood change, ( - ) new changes  All other systems were reviewed with the patient and are negative.  PHYSICAL EXAMINATION: ECOG PERFORMANCE STATUS: 1 - Symptomatic but completely ambulatory  Vitals:   05/08/21 1557  BP: (!) 148/53  Pulse: (!) 107  Resp: 18  Temp: (!) 97.5 F (36.4 C)  SpO2: 100%   Filed Weights   05/08/21 1557  Weight: 175 lb 3.2 oz (79.5 kg)    GENERAL: well appearing elderly African American female in NAD  SKIN: skin color, texture, turgor are normal, no rashes or significant lesions EYES: conjunctiva are pink and non-injected, sclera clear LUNGS: clear to auscultation and percussion with normal breathing effort HEART: regular rate & rhythm and no murmurs and no lower extremity edema Musculoskeletal: no cyanosis of digits and no clubbing  PSYCH: alert & oriented x 3, fluent speech NEURO: no focal motor/sensory deficits  LABORATORY DATA:  I have reviewed the data as listed CBC Latest Ref Rng & Units 05/08/2021 04/24/2021 04/23/2021  WBC 4.0 - 10.5 K/uL 7.1 7.2 -  Hemoglobin 12.0 - 15.0 g/dL 6.3(LL) 8.1(L) 8.3(L)  Hematocrit 36.0 - 46.0 % 20.2(L) 25.7(L) 26.3(L)  Platelets 150 - 400 K/uL 349 303 -    CMP Latest Ref Rng & Units 05/08/2021 04/24/2021 04/22/2021  Glucose 70 - 99 mg/dL 102(H) 101(H) 167(H)  BUN 8 - 23 mg/dL 20 19 18   Creatinine 0.44 - 1.00 mg/dL 1.08(H) 1.11(H) 1.26(H)  Sodium 135 - 145 mmol/L 144 138 141  Potassium 3.5 - 5.1 mmol/L 4.0 4.1 3.7  Chloride 98 - 111  mmol/L 109 103 101  CO2 22 - 32 mmol/L 24 27 25   Calcium 8.9 - 10.3 mg/dL 8.7(L) 8.7(L) 9.4  Total Protein 6.5 - 8.1 g/dL 7.0 - 7.2  Total Bilirubin 0.3 - 1.2 mg/dL 0.6 - 0.8  Alkaline Phos 38 - 126 U/L 97 - 105  AST 15 - 41 U/L 15 - 17  ALT 0 - 44 U/L 9 - 9    RADIOGRAPHIC STUDIES: None relevant to review.  DG Chest Portable 1 View  Result Date: 04/22/2021 CLINICAL DATA:  Syncope, exertional shortness of breath. Diaphoresis. Nose bleed. History of asthma. EXAM: PORTABLE CHEST 1 VIEW COMPARISON:  02/01/2021 FINDINGS: Stable mild prominence of the central pulmonary arteries potentially reflecting pulmonary arterial hypertension. Atherosclerotic calcification of the aortic arch. No cardiomegaly. Emphysema is present. The lungs appear otherwise clear. Lower cervical plate and screw fixator. No blunting of the costophrenic angles. IMPRESSION: 1. Emphysema with prominent central pulmonary arteries suggesting secondary pulmonary arterial hypertension. 2.  Aortic Atherosclerosis (ICD10-I70.0). 3. No acute findings. Electronically Signed   By: Van Clines M.D.   On: 04/22/2021 18:32   VAS [place] ABI WITH/WO TBI  Result Date: 04/16/2021  LOWER EXTREMITY DOPPLER STUDY Patient Name:  Sirinity Outland  Date of Exam:   04/16/2021 Medical Rec #: [id number]             Accession #:    [id number] Date of Birth: [birth year]            Patient Gender: F Patient Age:   70 years Exam Location:  [facility] Procedure:      VAS [place] ABI WITH/WO TBI Referring Phys: Jamelle Haring --------------------------------------------------------------------------------  Indications: Follow up left SFA stent.  Vascular Interventions: 03/29/21: Left SFA stent status post ischemic limb                         subsequent to EVAR complication on 12/07/39. Performing Technologist: Ralene Cork RVT  Examination Guidelines: A complete evaluation includes at minimum, Doppler waveform signals and systolic blood  pressure reading at the level of bilateral brachial, anterior tibial, and posterior tibial arteries, when vessel segments are accessible. Bilateral testing is considered an integral part of a complete examination. Photoelectric Plethysmograph (PPG) waveforms and toe systolic pressure readings are included as required and additional duplex testing as needed. Limited examinations for reoccurring indications may be performed as noted.  ABI Findings: +---------+------------------+-----+--------+--------+ Right    Rt Pressure (mmHg)IndexWaveformComment  +---------+------------------+-----+--------+--------+ Brachial 106                                     +---------+------------------+-----+--------+--------+ PTA      112               1.06 biphasic         +---------+------------------+-----+--------+--------+ DP       102               0.96 biphasic         +---------+------------------+-----+--------+--------+ Great Toe24                0.23                  +---------+------------------+-----+--------+--------+ +---------+------------------+-----+--------+-------+ Left     Lt Pressure (mmHg)IndexWaveformComment +---------+------------------+-----+--------+-------+ Brachial 99                                     +---------+------------------+-----+--------+-------+ PTA      104               0.98 biphasic        +---------+------------------+-----+--------+-------+ DP       76                0.72 biphasic        +---------+------------------+-----+--------+-------+ Great Toe56                0.53                 +---------+------------------+-----+--------+-------+ +-------+-----------+-----------+------------+------------+ ABI/TBIToday's ABIToday's TBIPrevious ABIPrevious TBI +-------+-----------+-----------+------------+------------+ Right  1.06       0.23       1.05        0.54         +-------+-----------+-----------+------------+------------+  Left   0.98       0.53       0.48        0.19         +-------+-----------+-----------+------------+------------+  Previous ABi on 02/26/21.  Summary: Right: Resting right ankle-brachial index is within normal range. The right toe-brachial index is abnormal. Left: Resting left ankle-brachial index is within normal range. The left toe-brachial index is abnormal.  *See table(s) above for measurements and observations.  Electronically signed by Jamelle Haring on 04/16/2021 at 4:58:18 PM.    Final     ASSESSMENT & PLAN Lacey Vega 69 y.o. female with medical  history significant for iron deficiency anemia 2/2 to chronic blood loss presents for a follow up visit.  Ms. Niccoli establish care with [place] in November 2020 and has been taking p.o. iron since that time.  When her levels failed to improve at her last visit she was started on IV Iron therapy and received IV feraheme 510mg  x 2 doses on 3/8 and 08/15/19. She received additional doses on 9/24 and 03/02/2020.  She had a hospital admission from 04/22/2021 until 04/24/2021 for epistaxis.  In clinic on 05/09/2019 the patient was found to have hemoglobin 6.3.  Transfusion was arranged for 2 units packed red blood cells on 05/09/2021.  #Iron Deficiency Anemia from Chronic Blood Loss --continue PO ferrous sulfide 325mg  daily with source of vitamin C. --source of blood loss is clearly identified as AVMs in the GI tract. No need for repeat endoscopy at this time. Likely a strong contribution from the patient's sickle cell trait preventing her from reaching a normal Hgb. Baseline in approximately 10.  --transfusion goal for Hgb <7.0. Hgb today at 6.3, will arrange for 2 units of PRBC tomorrow and IV iron in the following weeks.  --patient received IV feraheme 510mg  x2 dose on 9/24-10/06/2019 --RTC in 2 months for close monitoring of her Hgb.    #Sickle Cell Trait -- Hgb electrophoresis confirmed sickle cell trait on 07/27/2019. --like a strong contributing  factor to the patients persistently low Hgb.     #Bleeding AVM/GI Bleeding --currently followed by [facility] --last EGD on 05/17/2019, showed esophagitis and small non-bleeding duodenal angiodysplasias.  --continue to monitor.   Orders Placed This Encounter  Procedures   Sample to Blood Bank    Standing Status:   Future    Number of Occurrences:   1    Standing Expiration Date:   05/08/2022   All questions were answered. The patient knows to call the clinic with any problems, questions or concerns.  A total of more than 30 minutes were spent on this encounter and over half of that time was spent on counseling and coordination of care as outlined above.   Ledell Peoples, MD Department of Hematology/Oncology [place] [facility] Phone: [phone] Pager: [phone] Email: [email or web address]  05/08/2021 4:30 PM

## 2021-05-09 ENCOUNTER — Other Ambulatory Visit: Payer: Self-pay | Admitting: Hematology and Oncology

## 2021-05-09 ENCOUNTER — Inpatient Hospital Stay: Payer: Medicare HMO

## 2021-05-09 ENCOUNTER — Encounter: Payer: Self-pay | Admitting: Hematology and Oncology

## 2021-05-09 ENCOUNTER — Other Ambulatory Visit: Payer: Self-pay

## 2021-05-09 DIAGNOSIS — D500 Iron deficiency anemia secondary to blood loss (chronic): Secondary | ICD-10-CM

## 2021-05-09 LAB — FERRITIN: Ferritin: 47 ng/mL (ref 11–307)

## 2021-05-09 LAB — IRON AND TIBC
Iron: 45 ug/dL (ref 41–142)
Saturation Ratios: 13 % — ABNORMAL LOW (ref 21–57)
TIBC: 340 ug/dL (ref 236–444)
UIBC: 295 ug/dL (ref 120–384)

## 2021-05-09 MED ORDER — SODIUM CHLORIDE 0.9% IV SOLUTION
250.0000 mL | Freq: Once | INTRAVENOUS | Status: AC
  Administered 2021-05-09: 250 mL via INTRAVENOUS

## 2021-05-09 MED ORDER — ACETAMINOPHEN 325 MG PO TABS
650.0000 mg | ORAL_TABLET | Freq: Once | ORAL | Status: AC
  Administered 2021-05-09: 650 mg via ORAL
  Filled 2021-05-09: qty 2

## 2021-05-09 NOTE — Patient Instructions (Signed)
Blood Transfusion, Adult A blood transfusion is a procedure in which you receive blood or a type of blood cell (blood component) through an IV. You may need a blood transfusion when your blood level is low. This may result from a bleeding disorder, illness, injury, or surgery. The blood may come from a donor. You may also be able to donate blood for yourself (autologous blood donation) before a planned surgery. The blood given in a transfusion is made up of different blood components. You may receive: Red blood cells. These carry oxygen to the cells in the body. Platelets. These help your blood to clot. Plasma. This is the liquid part of your blood. It carries proteins and other substances throughout the body. White blood cells. These help you fight infections. If you have hemophilia or another clotting disorder, you may also receive other types of blood products. Tell a health care provider about: Any blood disorders you have. Any previous reactions you have had during a blood transfusion. Any allergies you have. All medicines you are taking, including vitamins, herbs, eye drops, creams, and over-the-counter medicines. Any surgeries you have had. Any medical conditions you have, including any recent fever or cold symptoms. Whether you are pregnant or may be pregnant. What are the risks? Generally, this is a safe procedure. However, problems may occur. The most common problems include: A mild allergic reaction, such as red, swollen areas of skin (hives) and itching. Fever or chills. This may be the body's response to new blood cells received. This may occur during or up to 4 hours after the transfusion. More serious problems may include: Transfusion-associated circulatory overload (TACO), or too much fluid in the lungs. This may cause breathing problems. A serious allergic reaction, such as difficulty breathing or swelling around the face and lips. Transfusion-related acute lung injury  (TRALI), which causes breathing difficulty and low oxygen in the blood. This can occur within hours of the transfusion or several days later. Iron overload. This can happen after receiving many blood transfusions over a period of time. Infection or virus being transmitted. This is rare because donated blood is carefully tested before it is given. Hemolytic transfusion reaction. This is rare. It happens when your body's defense system (immune system)tries to attack the new blood cells. Symptoms may include fever, chills, nausea, low blood pressure, and low back or chest pain. Transfusion-associated graft-versus-host disease (TAGVHD). This is rare. It happens when donated cells attack your body's healthy tissues. What happens before the procedure? Medicines Ask your health care provider about: Changing or stopping your regular medicines. This is especially important if you are taking diabetes medicines or blood thinners. Taking medicines such as aspirin and ibuprofen. These medicines can thin your blood. Do not take these medicines unless your health care provider tells you to take them. Taking over-the-counter medicines, vitamins, herbs, and supplements. General instructions Follow instructions from your health care provider about eating and drinking restrictions. You will have a blood test to determine your blood type. This is necessary to know what kind of blood your body will accept and to match it to the donor blood. If you are going to have a planned surgery, you may be able to do an autologous blood donation. This may be done in case you need to have a transfusion. You will have your temperature, blood pressure, and pulse monitored before the transfusion. If you have had an allergic reaction to a transfusion in the past, you may be given medicine to help prevent   a reaction. This medicine may be given to you by mouth (orally) or through an IV. Set aside time for the blood transfusion. This  procedure generally takes 1-4 hours to complete. What happens during the procedure?  An IV will be inserted into one of your veins. The bag of donated blood will be attached to your IV. The blood will then enter through your vein. Your temperature, blood pressure, and pulse will be monitored regularly during the transfusion. This monitoring is done to detect early signs of a transfusion reaction. Tell your nurse right away if you have any of these symptoms during the transfusion: Shortness of breath or trouble breathing. Chest or back pain. Fever or chills. Hives or itching. If you have any signs or symptoms of a reaction, your transfusion will be stopped and you may be given medicine. When the transfusion is complete, your IV will be removed. Pressure may be applied to the IV site for a few minutes. A bandage (dressing)will be applied. The procedure may vary among health care providers and hospitals. What happens after the procedure? Your temperature, blood pressure, pulse, breathing rate, and blood oxygen level will be monitored until you leave the hospital or clinic. Your blood may be tested to see how you are responding to the transfusion. You may be warmed with fluids or blankets to maintain a normal body temperature. If you receive your blood transfusion in an outpatient setting, you will be told whom to contact to report any reactions. Where to find more information For more information on blood transfusions, visit the [employer]: redcross.org Summary A blood transfusion is a procedure in which you receive blood or a type of blood cell (blood component) through an IV. The blood you receive may come from a donor or be donated by yourself (autologous blood donation) before a planned surgery. The blood given in a transfusion is made up of different blood components. You may receive red blood cells, platelets, plasma, or white blood cells depending on the condition treated. Your  temperature, blood pressure, and pulse will be monitored before, during, and after the transfusion. After the transfusion, your blood may be tested to see how your body has responded. This information is not intended to replace advice given to you by your health care provider. Make sure you discuss any questions you have with your health care provider. Document Revised: 03/24/2019 Document Reviewed: 11/11/2018 Elsevier Patient Education  2022 Else[employer] Inc.  

## 2021-05-10 ENCOUNTER — Telehealth: Payer: Self-pay | Admitting: Pharmacy Technician

## 2021-05-10 LAB — TYPE AND SCREEN
ABO/RH(D): A POS
Antibody Screen: NEGATIVE
Donor AG Type: NEGATIVE
Donor AG Type: NEGATIVE
Unit division: 0
Unit division: 0

## 2021-05-10 LAB — BPAM RBC
Blood Product Expiration Date: 202212282359
Blood Product Expiration Date: 202212282359
ISSUE DATE / TIME: 202212081236
ISSUE DATE / TIME: 202212081236
Unit Type and Rh: 6200
Unit Type and Rh: 6200

## 2021-05-10 NOTE — Telephone Encounter (Signed)
Dr. Lorenso Courier, Auth Submission: DENIED Payer: [employer] Medication & CPT/J Code(s) submitted: Feraheme (ferumoxytol) [phone] Route of submission (phone, fax, portal): [phone] Auth type: Buy/Bill Units/visits requested: 2  Denied due to patient has not tried and or failed step therapy.  Venofer Infef Ferrlicit  Would you like to try venofer??? Please advise.

## 2021-05-22 ENCOUNTER — Ambulatory Visit
Admission: RE | Admit: 2021-05-22 | Discharge: 2021-05-22 | Disposition: A | Discharge status: Home / Self Care | Payer: Medicare HMO | Source: Ambulatory Visit | Attending: Internal Medicine | Admitting: Internal Medicine

## 2021-05-22 DIAGNOSIS — Z1231 Encounter for screening mammogram for malignant neoplasm of breast: Secondary | ICD-10-CM

## 2021-05-28 ENCOUNTER — Other Ambulatory Visit: Payer: Self-pay | Admitting: Pharmacy Technician

## 2021-05-28 NOTE — Telephone Encounter (Signed)
We will change the order and schedule the patient as soon as possbile, Lacey Vega Lacey Vega

## 2021-06-04 ENCOUNTER — Other Ambulatory Visit: Payer: Self-pay | Admitting: Hematology and Oncology

## 2021-06-04 ENCOUNTER — Other Ambulatory Visit: Payer: Self-pay | Admitting: *Deleted

## 2021-06-04 ENCOUNTER — Other Ambulatory Visit: Payer: Self-pay

## 2021-06-04 ENCOUNTER — Ambulatory Visit (INDEPENDENT_AMBULATORY_CARE_PROVIDER_SITE_OTHER): Payer: Medicare HMO

## 2021-06-04 VITALS — BP 101/56 | HR 76 | Temp 97.4°F | Resp 18 | Ht 65.0 in | Wt 179.2 lb

## 2021-06-04 DIAGNOSIS — D500 Iron deficiency anemia secondary to blood loss (chronic): Secondary | ICD-10-CM

## 2021-06-04 MED ORDER — EPINEPHRINE 0.3 MG/0.3ML IJ SOAJ: 0.3000 mg | Freq: Once | INTRAMUSCULAR | Status: DC | PRN

## 2021-06-04 MED ORDER — DIPHENHYDRAMINE HCL 50 MG/ML IJ SOLN: 50.0000 mg | Freq: Once | INTRAMUSCULAR | Status: DC | PRN

## 2021-06-04 MED ORDER — ALBUTEROL SULFATE HFA 108 (90 BASE) MCG/ACT IN AERS: 2.0000 | INHALATION_SPRAY | Freq: Once | RESPIRATORY_TRACT | Status: DC | PRN

## 2021-06-04 MED ORDER — DIPHENHYDRAMINE HCL 25 MG PO CAPS
50.0000 mg | ORAL_CAPSULE | Freq: Once | ORAL | Status: AC
  Administered 2021-06-04: 50 mg via ORAL
  Filled 2021-06-04: qty 2

## 2021-06-04 MED ORDER — SODIUM CHLORIDE 0.9 % IV SOLN: Freq: Once | INTRAVENOUS | Status: DC | PRN

## 2021-06-04 MED ORDER — ACETAMINOPHEN 325 MG PO TABS
650.0000 mg | ORAL_TABLET | Freq: Once | ORAL | Status: AC
  Administered 2021-06-04: 650 mg via ORAL
  Filled 2021-06-04: qty 2

## 2021-06-04 MED ORDER — SODIUM CHLORIDE 0.9 % IV SOLN
200.0000 mg | Freq: Once | INTRAVENOUS | Status: AC
  Administered 2021-06-04: 200 mg via INTRAVENOUS
  Filled 2021-06-04: qty 10

## 2021-06-04 MED ORDER — FAMOTIDINE IN NACL 20-0.9 MG/50ML-% IV SOLN: 20.0000 mg | Freq: Once | INTRAVENOUS | Status: DC | PRN

## 2021-06-04 MED ORDER — METHYLPREDNISOLONE SODIUM SUCC 125 MG IJ SOLR: 125.0000 mg | Freq: Once | INTRAMUSCULAR | Status: DC | PRN

## 2021-06-04 NOTE — Progress Notes (Signed)
Diagnosis: Iron Deficiency Anemia  Provider:  Marshell Garfinkel, MD  Procedure: Infusion  IV Type: Peripheral, IV Location: L Antecubital  Venofer (Iron Sucrose), Dose: 200 mg  Infusion Start Time: 8001  Infusion Stop Time: 2393  Post Infusion IV Care: Observation period completed and Peripheral IV Discontinued  Discharge: Condition: Good, Destination: Home . AVS provided to patient.   Performed by:  Malic Rosten, Sherlon Handing, LPN

## 2021-06-05 ENCOUNTER — Other Ambulatory Visit: Payer: Self-pay | Admitting: *Deleted

## 2021-06-05 ENCOUNTER — Telehealth: Payer: Self-pay

## 2021-06-05 ENCOUNTER — Telehealth: Payer: Self-pay | Admitting: *Deleted

## 2021-06-05 ENCOUNTER — Inpatient Hospital Stay: Payer: Medicare HMO | Attending: Hematology and Oncology

## 2021-06-05 ENCOUNTER — Inpatient Hospital Stay: Payer: Medicare HMO

## 2021-06-05 DIAGNOSIS — D573 Sickle-cell trait: Secondary | ICD-10-CM | POA: Diagnosis not present

## 2021-06-05 DIAGNOSIS — D500 Iron deficiency anemia secondary to blood loss (chronic): Secondary | ICD-10-CM | POA: Insufficient documentation

## 2021-06-05 LAB — IRON AND IRON BINDING CAPACITY (CC-WL,HP ONLY)
Iron: 74 ug/dL (ref 28–170)
Saturation Ratios: 19 % (ref 10.4–31.8)
TIBC: 393 ug/dL (ref 250–450)
UIBC: 319 ug/dL (ref 148–442)

## 2021-06-05 LAB — CMP (CANCER CENTER ONLY)
ALT: 11 U/L (ref 0–44)
AST: 18 U/L (ref 15–41)
Albumin: 3.5 g/dL (ref 3.5–5.0)
Alkaline Phosphatase: 101 U/L (ref 38–126)
Anion gap: 4 — ABNORMAL LOW (ref 5–15)
BUN: 22 mg/dL (ref 8–23)
CO2: 30 mmol/L (ref 22–32)
Calcium: 9 mg/dL (ref 8.9–10.3)
Chloride: 108 mmol/L (ref 98–111)
Creatinine: 1.03 mg/dL — ABNORMAL HIGH (ref 0.44–1.00)
GFR, Estimated: 58 mL/min — ABNORMAL LOW (ref >60)
Glucose, Bld: 103 mg/dL — ABNORMAL HIGH (ref 70–99)
Potassium: 4 mmol/L (ref 3.5–5.1)
Sodium: 142 mmol/L (ref 135–145)
Total Bilirubin: 0.5 mg/dL (ref 0.3–1.2)
Total Protein: 6.6 g/dL (ref 6.5–8.1)

## 2021-06-05 LAB — CBC WITH DIFFERENTIAL (CANCER CENTER ONLY)
Abs Immature Granulocytes: 0.03 10*3/uL (ref 0.00–0.07)
Basophils Absolute: 0 10*3/uL (ref 0.0–0.1)
Basophils Relative: 0 %
Eosinophils Absolute: 0.2 10*3/uL (ref 0.0–0.5)
Eosinophils Relative: 3 %
HCT: 18.7 % — ABNORMAL LOW (ref 36.0–46.0)
Hemoglobin: 5.7 g/dL — CL (ref 12.0–15.0)
Immature Granulocytes: 0 %
Lymphocytes Relative: 18 %
Lymphs Abs: 1.2 10*3/uL (ref 0.7–4.0)
MCH: 26 pg (ref 26.0–34.0)
MCHC: 30.5 g/dL (ref 30.0–36.0)
MCV: 85.4 fL (ref 80.0–100.0)
Monocytes Absolute: 0.6 10*3/uL (ref 0.1–1.0)
Monocytes Relative: 9 %
Neutro Abs: 4.6 10*3/uL (ref 1.7–7.7)
Neutrophils Relative %: 70 %
Platelet Count: 348 10*3/uL (ref 150–400)
RBC: 2.19 MIL/uL — ABNORMAL LOW (ref 3.87–5.11)
RDW: 15.8 % — ABNORMAL HIGH (ref 11.5–15.5)
WBC Count: 6.7 10*3/uL (ref 4.0–10.5)
nRBC: 0 % (ref 0.0–0.2)

## 2021-06-05 LAB — PREPARE RBC (CROSSMATCH)

## 2021-06-05 LAB — FERRITIN: Ferritin: 155 ng/mL (ref 11–307)

## 2021-06-05 LAB — RETIC PANEL
Immature Retic Fract: 39.3 % — ABNORMAL HIGH (ref 2.3–15.9)
RBC.: 2.18 MIL/uL — ABNORMAL LOW (ref 3.87–5.11)
Retic Count, Absolute: 113.4 10*3/uL (ref 19.0–186.0)
Retic Ct Pct: 5.2 % — ABNORMAL HIGH (ref 0.4–3.1)
Reticulocyte Hemoglobin: 22.2 pg — ABNORMAL LOW (ref >27.9)

## 2021-06-05 LAB — SAMPLE TO BLOOD BANK

## 2021-06-05 MED ORDER — SODIUM CHLORIDE 0.9% IV SOLUTION
250.0000 mL | Freq: Once | INTRAVENOUS | Status: AC
  Administered 2021-06-05: 250 mL via INTRAVENOUS

## 2021-06-05 MED ORDER — ACETAMINOPHEN 325 MG PO TABS
650.0000 mg | ORAL_TABLET | Freq: Once | ORAL | Status: AC
  Administered 2021-06-05: 650 mg via ORAL
  Filled 2021-06-05: qty 2

## 2021-06-05 NOTE — Telephone Encounter (Signed)
CRITICAL VALUE STICKER  CRITICAL VALUE: Hgb = 5.7  RECEIVER (on-site recipient of call): Yetta Glassman, [place] NOTIFIED: 06/05/21 at 10:09am  MESSENGER (representative from lab): Pam  MD NOTIFIED: DORSEY  TIME OF NOTIFICATION: 06/05/21 at 10:10am  RESPONSE: Notification given to beth T., RN for follow-up with pt and provider.

## 2021-06-05 NOTE — Patient Instructions (Signed)
Blood Transfusion, Adult, Care After This sheet gives you information about how to care for yourself after your procedure. Your doctor may also give you more specific instructions. If you have problems or questions, contact your doctor. What can I expect after the procedure? After the procedure, it is common to have: Bruising and soreness at the IV site. A headache. Follow these instructions at home: Insertion site care   Follow instructions from your doctor about how to take care of your insertion site. This is where an IV tube was put into your vein. Make sure you: Wash your hands with soap and water before and after you change your bandage (dressing). If you cannot use soap and water, use hand sanitizer. Change your bandage as told by your doctor. Check your insertion site every day for signs of infection. Check for: Redness, swelling, or pain. Bleeding from the site. Warmth. Pus or a bad smell. General instructions Take over-the-counter and prescription medicines only as told by your doctor. Rest as told by your doctor. Go back to your normal activities as told by your doctor. Keep all follow-up visits as told by your doctor. This is important. Contact a doctor if: You have itching or red, swollen areas of skin (hives). You feel worried or nervous (anxious). You feel weak after doing your normal activities. You have redness, swelling, warmth, or pain around the insertion site. You have blood coming from the insertion site, and the blood does not stop with pressure. You have pus or a bad smell coming from the insertion site. Get help right away if: You have signs of a serious reaction. This may be coming from an allergy or the body's defense system (immune system). Signs include: Trouble breathing or shortness of breath. Swelling of the face or feeling warm (flushed). Fever or chills. Head, chest, or back pain. Dark pee (urine) or blood in the pee. Widespread rash. Fast  heartbeat. Feeling dizzy or light-headed. You may receive your blood transfusion in an outpatient setting. If so, you will be told whom to contact to report any reactions. These symptoms may be an emergency. Do not wait to see if the symptoms will go away. Get medical help right away. Call your local emergency services (911 in the [place] Do not drive yourself to the hospital. Summary Bruising and soreness at the IV site are common. Check your insertion site every day for signs of infection. Rest as told by your doctor. Go back to your normal activities as told by your doctor. Get help right away if you have signs of a serious reaction. This information is not intended to replace advice given to you by your health care provider. Make sure you discuss any questions you have with your health care provider. Document Revised: 09/13/2020 Document Reviewed: 11/11/2018 Elsevier Patient Education  2022 Else[employer] Inc.  

## 2021-06-05 NOTE — Telephone Encounter (Signed)
Call made to pt after receiving her CBC results. Pt's HGB is 5.7 and will require 2 units of blood. Pt voiced understanding and will return to [facility]. He appt for blood will be at [place] had been made aware and was able to take pt today.   Transfusion orders placed.

## 2021-06-06 LAB — TYPE AND SCREEN
ABO/RH(D): A POS
Antibody Screen: NEGATIVE
Donor AG Type: NEGATIVE
Donor AG Type: NEGATIVE
Unit division: 0
Unit division: 0

## 2021-06-06 LAB — BPAM RBC
Blood Product Expiration Date: 202301222359
Blood Product Expiration Date: 202301252359
ISSUE DATE / TIME: 202301041245
ISSUE DATE / TIME: 202301041245
Unit Type and Rh: 6200
Unit Type and Rh: 6200

## 2021-06-11 ENCOUNTER — Other Ambulatory Visit: Payer: Self-pay

## 2021-06-11 ENCOUNTER — Ambulatory Visit (INDEPENDENT_AMBULATORY_CARE_PROVIDER_SITE_OTHER): Payer: Medicare HMO

## 2021-06-11 VITALS — BP 119/65 | HR 93 | Temp 97.8°F | Resp 16 | Ht 67.0 in | Wt 180.0 lb

## 2021-06-11 DIAGNOSIS — D500 Iron deficiency anemia secondary to blood loss (chronic): Secondary | ICD-10-CM

## 2021-06-11 MED ORDER — ACETAMINOPHEN 325 MG PO TABS
650.0000 mg | ORAL_TABLET | Freq: Once | ORAL | Status: AC
  Administered 2021-06-11: 650 mg via ORAL
  Filled 2021-06-11: qty 2

## 2021-06-11 MED ORDER — ALBUTEROL SULFATE HFA 108 (90 BASE) MCG/ACT IN AERS: 2.0000 | INHALATION_SPRAY | Freq: Once | RESPIRATORY_TRACT | Status: DC | PRN

## 2021-06-11 MED ORDER — FAMOTIDINE IN NACL 20-0.9 MG/50ML-% IV SOLN: 20.0000 mg | Freq: Once | INTRAVENOUS | Status: DC | PRN

## 2021-06-11 MED ORDER — EPINEPHRINE 0.3 MG/0.3ML IJ SOAJ: 0.3000 mg | Freq: Once | INTRAMUSCULAR | Status: DC | PRN

## 2021-06-11 MED ORDER — DIPHENHYDRAMINE HCL 25 MG PO CAPS
50.0000 mg | ORAL_CAPSULE | Freq: Once | ORAL | Status: AC
  Administered 2021-06-11: 50 mg via ORAL
  Filled 2021-06-11: qty 2

## 2021-06-11 MED ORDER — DIPHENHYDRAMINE HCL 50 MG/ML IJ SOLN: 50.0000 mg | Freq: Once | INTRAMUSCULAR | Status: DC | PRN

## 2021-06-11 MED ORDER — SODIUM CHLORIDE 0.9 % IV SOLN
200.0000 mg | Freq: Once | INTRAVENOUS | Status: AC
  Administered 2021-06-11: 200 mg via INTRAVENOUS
  Filled 2021-06-11: qty 10

## 2021-06-11 MED ORDER — METHYLPREDNISOLONE SODIUM SUCC 125 MG IJ SOLR: 125.0000 mg | Freq: Once | INTRAMUSCULAR | Status: DC | PRN

## 2021-06-11 MED ORDER — SODIUM CHLORIDE 0.9 % IV SOLN: Freq: Once | INTRAVENOUS | Status: DC | PRN

## 2021-06-11 NOTE — Progress Notes (Signed)
Diagnosis: Iron Deficiency Anemia  Provider:  Marshell Garfinkel, MD  Procedure: Infusion  IV Type: Peripheral, IV Location: L Antecubital  Venofer (Iron Sucrose), Dose: 200 mg  Infusion Start Time: 0814  Infusion Stop Time: 0958  Post Infusion IV Care: Peripheral IV Discontinued  Discharge: Condition: Good, Destination: Home . AVS provided to patient.   Performed by:  Koren Shiver, RN

## 2021-06-18 ENCOUNTER — Other Ambulatory Visit: Payer: Self-pay

## 2021-06-18 ENCOUNTER — Ambulatory Visit (INDEPENDENT_AMBULATORY_CARE_PROVIDER_SITE_OTHER): Payer: Medicare HMO

## 2021-06-18 VITALS — BP 123/63 | HR 68 | Temp 97.9°F | Resp 16 | Ht 65.0 in | Wt 178.8 lb

## 2021-06-18 DIAGNOSIS — D500 Iron deficiency anemia secondary to blood loss (chronic): Secondary | ICD-10-CM | POA: Diagnosis not present

## 2021-06-18 MED ORDER — EPINEPHRINE 0.3 MG/0.3ML IJ SOAJ: 0.3000 mg | Freq: Once | INTRAMUSCULAR | Status: DC | PRN

## 2021-06-18 MED ORDER — DIPHENHYDRAMINE HCL 25 MG PO CAPS
50.0000 mg | ORAL_CAPSULE | Freq: Once | ORAL | Status: AC
  Administered 2021-06-18: 50 mg via ORAL
  Filled 2021-06-18: qty 2

## 2021-06-18 MED ORDER — METHYLPREDNISOLONE SODIUM SUCC 125 MG IJ SOLR: 125.0000 mg | Freq: Once | INTRAMUSCULAR | Status: DC | PRN

## 2021-06-18 MED ORDER — ALBUTEROL SULFATE HFA 108 (90 BASE) MCG/ACT IN AERS: 2.0000 | INHALATION_SPRAY | Freq: Once | RESPIRATORY_TRACT | Status: DC | PRN

## 2021-06-18 MED ORDER — DIPHENHYDRAMINE HCL 50 MG/ML IJ SOLN: 50.0000 mg | Freq: Once | INTRAMUSCULAR | Status: DC | PRN

## 2021-06-18 MED ORDER — ACETAMINOPHEN 325 MG PO TABS
650.0000 mg | ORAL_TABLET | Freq: Once | ORAL | Status: AC
  Administered 2021-06-18: 650 mg via ORAL
  Filled 2021-06-18: qty 2

## 2021-06-18 MED ORDER — FAMOTIDINE IN NACL 20-0.9 MG/50ML-% IV SOLN: 20.0000 mg | Freq: Once | INTRAVENOUS | Status: DC | PRN

## 2021-06-18 MED ORDER — SODIUM CHLORIDE 0.9 % IV SOLN: Freq: Once | INTRAVENOUS | Status: DC | PRN

## 2021-06-18 MED ORDER — SODIUM CHLORIDE 0.9 % IV SOLN
200.0000 mg | Freq: Once | INTRAVENOUS | Status: AC
  Administered 2021-06-18: 200 mg via INTRAVENOUS
  Filled 2021-06-18: qty 10

## 2021-06-18 NOTE — Progress Notes (Signed)
Diagnosis: Iron Deficiency Anemia  Provider:  Marshell Garfinkel, MD  Procedure: Infusion  IV Type: Peripheral, IV Location: L Forearm  Venofer (Iron Sucrose), Dose: 200 mg  Infusion Start Time: 1224  Infusion Stop Time: 4975  Post Infusion IV Care: Peripheral IV Discontinued  Discharge: Condition: Good, Destination: Home . AVS provided to patient.   Performed by:  Koren Shiver, RN

## 2021-06-25 ENCOUNTER — Ambulatory Visit (INDEPENDENT_AMBULATORY_CARE_PROVIDER_SITE_OTHER): Payer: Medicare HMO

## 2021-06-25 ENCOUNTER — Other Ambulatory Visit: Payer: Self-pay

## 2021-06-25 VITALS — BP 102/58 | HR 81 | Temp 97.8°F | Resp 18 | Ht 65.0 in | Wt 177.8 lb

## 2021-06-25 DIAGNOSIS — D500 Iron deficiency anemia secondary to blood loss (chronic): Secondary | ICD-10-CM

## 2021-06-25 MED ORDER — DIPHENHYDRAMINE HCL 25 MG PO CAPS
50.0000 mg | ORAL_CAPSULE | Freq: Once | ORAL | Status: AC
  Administered 2021-06-25: 09:00:00 50 mg via ORAL
  Filled 2021-06-25: qty 2

## 2021-06-25 MED ORDER — SODIUM CHLORIDE 0.9 % IV SOLN
200.0000 mg | Freq: Once | INTRAVENOUS | Status: AC
  Administered 2021-06-25: 10:00:00 200 mg via INTRAVENOUS
  Filled 2021-06-25: qty 10

## 2021-06-25 MED ORDER — DIPHENHYDRAMINE HCL 50 MG/ML IJ SOLN: 50.0000 mg | Freq: Once | INTRAMUSCULAR | Status: DC | PRN

## 2021-06-25 MED ORDER — METHYLPREDNISOLONE SODIUM SUCC 125 MG IJ SOLR: 125.0000 mg | Freq: Once | INTRAMUSCULAR | Status: DC | PRN

## 2021-06-25 MED ORDER — FAMOTIDINE IN NACL 20-0.9 MG/50ML-% IV SOLN: 20.0000 mg | Freq: Once | INTRAVENOUS | Status: DC | PRN

## 2021-06-25 MED ORDER — EPINEPHRINE 0.3 MG/0.3ML IJ SOAJ: 0.3000 mg | Freq: Once | INTRAMUSCULAR | Status: DC | PRN

## 2021-06-25 MED ORDER — SODIUM CHLORIDE 0.9 % IV SOLN: Freq: Once | INTRAVENOUS | Status: DC | PRN

## 2021-06-25 MED ORDER — ALBUTEROL SULFATE HFA 108 (90 BASE) MCG/ACT IN AERS: 2.0000 | INHALATION_SPRAY | Freq: Once | RESPIRATORY_TRACT | Status: DC | PRN

## 2021-06-25 MED ORDER — ACETAMINOPHEN 325 MG PO TABS
650.0000 mg | ORAL_TABLET | Freq: Once | ORAL | Status: AC
  Administered 2021-06-25: 09:00:00 650 mg via ORAL
  Filled 2021-06-25: qty 2

## 2021-06-25 NOTE — Progress Notes (Signed)
Diagnosis: Iron Deficiency Anemia  Provider:  Marshell Garfinkel, MD  Procedure: Infusion  IV Type: Peripheral, IV Location: L Forearm  Venofer (Iron Sucrose), Dose: 200 mg  Infusion Start Time: 09.38 06/25/2021  Infusion Stop Time: 09.55 06/25/2021  Post Infusion IV Care: Peripheral IV Discontinued  Discharge: Condition: Good, Destination: Home . AVS provided to patient.   Performed by:  Arnoldo Morale, RN

## 2021-07-02 ENCOUNTER — Ambulatory Visit (INDEPENDENT_AMBULATORY_CARE_PROVIDER_SITE_OTHER): Payer: Medicare HMO

## 2021-07-02 ENCOUNTER — Other Ambulatory Visit: Payer: Self-pay

## 2021-07-02 VITALS — BP 104/57 | HR 79 | Temp 98.3°F | Resp 18 | Ht 65.0 in | Wt 180.4 lb

## 2021-07-02 DIAGNOSIS — D500 Iron deficiency anemia secondary to blood loss (chronic): Secondary | ICD-10-CM | POA: Diagnosis not present

## 2021-07-02 MED ORDER — SODIUM CHLORIDE 0.9 % IV SOLN: Freq: Once | INTRAVENOUS | Status: DC | PRN

## 2021-07-02 MED ORDER — DIPHENHYDRAMINE HCL 25 MG PO CAPS
50.0000 mg | ORAL_CAPSULE | Freq: Once | ORAL | Status: AC
  Administered 2021-07-02: 50 mg via ORAL
  Filled 2021-07-02: qty 2

## 2021-07-02 MED ORDER — ALBUTEROL SULFATE HFA 108 (90 BASE) MCG/ACT IN AERS: 2.0000 | INHALATION_SPRAY | Freq: Once | RESPIRATORY_TRACT | Status: DC | PRN

## 2021-07-02 MED ORDER — METHYLPREDNISOLONE SODIUM SUCC 125 MG IJ SOLR: 125.0000 mg | Freq: Once | INTRAMUSCULAR | Status: DC | PRN

## 2021-07-02 MED ORDER — FAMOTIDINE IN NACL 20-0.9 MG/50ML-% IV SOLN: 20.0000 mg | Freq: Once | INTRAVENOUS | Status: DC | PRN

## 2021-07-02 MED ORDER — EPINEPHRINE 0.3 MG/0.3ML IJ SOAJ: 0.3000 mg | Freq: Once | INTRAMUSCULAR | Status: DC | PRN

## 2021-07-02 MED ORDER — DIPHENHYDRAMINE HCL 50 MG/ML IJ SOLN: 50.0000 mg | Freq: Once | INTRAMUSCULAR | Status: DC | PRN

## 2021-07-02 MED ORDER — SODIUM CHLORIDE 0.9 % IV SOLN
200.0000 mg | Freq: Once | INTRAVENOUS | Status: AC
  Administered 2021-07-02: 200 mg via INTRAVENOUS
  Filled 2021-07-02 (×2): qty 10

## 2021-07-02 MED ORDER — ACETAMINOPHEN 325 MG PO TABS
650.0000 mg | ORAL_TABLET | Freq: Once | ORAL | Status: AC
  Administered 2021-07-02: 650 mg via ORAL
  Filled 2021-07-02: qty 2

## 2021-07-02 NOTE — Progress Notes (Signed)
Diagnosis: Iron Deficiency Anemia  Provider:  Marshell Garfinkel, MD  Procedure: Infusion  IV Type: Peripheral, IV Location: L Antecubital  Venofer (Iron Sucrose), Dose: 200 mg  Infusion Start Time: 0939  Infusion Stop Time: 1000  Post Infusion IV Care: Peripheral IV Discontinued  Discharge: Condition: Good, Destination: Home . AVS provided to patient.   Performed by:  Cleophus Molt, RN

## 2021-07-05 ENCOUNTER — Inpatient Hospital Stay (HOSPITAL_BASED_OUTPATIENT_CLINIC_OR_DEPARTMENT_OTHER): Payer: Medicare HMO | Admitting: Hematology and Oncology

## 2021-07-05 ENCOUNTER — Inpatient Hospital Stay: Payer: Medicare HMO | Attending: Hematology and Oncology

## 2021-07-05 ENCOUNTER — Other Ambulatory Visit: Payer: Self-pay | Admitting: Hematology and Oncology

## 2021-07-05 ENCOUNTER — Other Ambulatory Visit: Payer: Self-pay

## 2021-07-05 ENCOUNTER — Telehealth: Payer: Self-pay

## 2021-07-05 ENCOUNTER — Inpatient Hospital Stay (HOSPITAL_COMMUNITY)
Admission: RE | Admit: 2021-07-05 | Discharge: 2021-07-09 | DRG: 378 | Disposition: A | Discharge status: Home / Self Care | Payer: Medicare HMO | Source: Ambulatory Visit | Attending: Family Medicine | Admitting: Family Medicine

## 2021-07-05 VITALS — BP 115/51 | HR 102 | Temp 98.2°F | Resp 16 | Ht 65.0 in | Wt 181.0 lb

## 2021-07-05 DIAGNOSIS — D573 Sickle-cell trait: Secondary | ICD-10-CM | POA: Diagnosis present

## 2021-07-05 DIAGNOSIS — I739 Peripheral vascular disease, unspecified: Secondary | ICD-10-CM | POA: Diagnosis present

## 2021-07-05 DIAGNOSIS — Z79899 Other long term (current) drug therapy: Secondary | ICD-10-CM

## 2021-07-05 DIAGNOSIS — Z20822 Contact with and (suspected) exposure to covid-19: Secondary | ICD-10-CM | POA: Diagnosis present

## 2021-07-05 DIAGNOSIS — K219 Gastro-esophageal reflux disease without esophagitis: Secondary | ICD-10-CM | POA: Diagnosis present

## 2021-07-05 DIAGNOSIS — Z79890 Hormone replacement therapy (postmenopausal): Secondary | ICD-10-CM

## 2021-07-05 DIAGNOSIS — Z87891 Personal history of nicotine dependence: Secondary | ICD-10-CM

## 2021-07-05 DIAGNOSIS — J4489 Other specified chronic obstructive pulmonary disease: Secondary | ICD-10-CM | POA: Diagnosis present

## 2021-07-05 DIAGNOSIS — D62 Acute posthemorrhagic anemia: Principal | ICD-10-CM | POA: Diagnosis present

## 2021-07-05 DIAGNOSIS — E1122 Type 2 diabetes mellitus with diabetic chronic kidney disease: Secondary | ICD-10-CM | POA: Diagnosis present

## 2021-07-05 DIAGNOSIS — D500 Iron deficiency anemia secondary to blood loss (chronic): Secondary | ICD-10-CM

## 2021-07-05 DIAGNOSIS — Z95828 Presence of other vascular implants and grafts: Secondary | ICD-10-CM

## 2021-07-05 DIAGNOSIS — E785 Hyperlipidemia, unspecified: Secondary | ICD-10-CM | POA: Diagnosis present

## 2021-07-05 DIAGNOSIS — E039 Hypothyroidism, unspecified: Secondary | ICD-10-CM | POA: Diagnosis present

## 2021-07-05 DIAGNOSIS — Z833 Family history of diabetes mellitus: Secondary | ICD-10-CM

## 2021-07-05 DIAGNOSIS — Z7982 Long term (current) use of aspirin: Secondary | ICD-10-CM

## 2021-07-05 DIAGNOSIS — Z7902 Long term (current) use of antithrombotics/antiplatelets: Secondary | ICD-10-CM

## 2021-07-05 DIAGNOSIS — E1151 Type 2 diabetes mellitus with diabetic peripheral angiopathy without gangrene: Secondary | ICD-10-CM | POA: Diagnosis present

## 2021-07-05 DIAGNOSIS — Z8249 Family history of ischemic heart disease and other diseases of the circulatory system: Secondary | ICD-10-CM

## 2021-07-05 DIAGNOSIS — J449 Chronic obstructive pulmonary disease, unspecified: Secondary | ICD-10-CM | POA: Diagnosis present

## 2021-07-05 DIAGNOSIS — K31811 Angiodysplasia of stomach and duodenum with bleeding: Principal | ICD-10-CM | POA: Diagnosis present

## 2021-07-05 DIAGNOSIS — N1831 Chronic kidney disease, stage 3a: Secondary | ICD-10-CM | POA: Diagnosis present

## 2021-07-05 LAB — CMP (CANCER CENTER ONLY)
ALT: 10 U/L (ref 0–44)
AST: 18 U/L (ref 15–41)
Albumin: 3.6 g/dL (ref 3.5–5.0)
Alkaline Phosphatase: 100 U/L (ref 38–126)
Anion gap: 6 (ref 5–15)
BUN: 22 mg/dL (ref 8–23)
CO2: 30 mmol/L (ref 22–32)
Calcium: 8.9 mg/dL (ref 8.9–10.3)
Chloride: 105 mmol/L (ref 98–111)
Creatinine: 1.07 mg/dL — ABNORMAL HIGH (ref 0.44–1.00)
GFR, Estimated: 56 mL/min — ABNORMAL LOW (ref >60)
Glucose, Bld: 121 mg/dL — ABNORMAL HIGH (ref 70–99)
Potassium: 3.9 mmol/L (ref 3.5–5.1)
Sodium: 141 mmol/L (ref 135–145)
Total Bilirubin: 0.5 mg/dL (ref 0.3–1.2)
Total Protein: 6.4 g/dL — ABNORMAL LOW (ref 6.5–8.1)

## 2021-07-05 LAB — CBC WITH DIFFERENTIAL (CANCER CENTER ONLY)
Abs Immature Granulocytes: 0.02 10*3/uL (ref 0.00–0.07)
Basophils Absolute: 0 10*3/uL (ref 0.0–0.1)
Basophils Relative: 0 %
Eosinophils Absolute: 0.1 10*3/uL (ref 0.0–0.5)
Eosinophils Relative: 2 %
HCT: 20.9 % — ABNORMAL LOW (ref 36.0–46.0)
Hemoglobin: 6.2 g/dL — CL (ref 12.0–15.0)
Immature Granulocytes: 0 %
Lymphocytes Relative: 11 %
Lymphs Abs: 0.7 10*3/uL (ref 0.7–4.0)
MCH: 26.4 pg (ref 26.0–34.0)
MCHC: 29.7 g/dL — ABNORMAL LOW (ref 30.0–36.0)
MCV: 88.9 fL (ref 80.0–100.0)
Monocytes Absolute: 0.5 10*3/uL (ref 0.1–1.0)
Monocytes Relative: 7 %
Neutro Abs: 5.6 10*3/uL (ref 1.7–7.7)
Neutrophils Relative %: 80 %
Platelet Count: 286 10*3/uL (ref 150–400)
RBC: 2.35 MIL/uL — ABNORMAL LOW (ref 3.87–5.11)
RDW: 17.3 % — ABNORMAL HIGH (ref 11.5–15.5)
WBC Count: 7 10*3/uL (ref 4.0–10.5)
nRBC: 0 % (ref 0.0–0.2)

## 2021-07-05 LAB — PREPARE RBC (CROSSMATCH)

## 2021-07-05 LAB — RETIC PANEL
Immature Retic Fract: 33.9 % — ABNORMAL HIGH (ref 2.3–15.9)
RBC.: 2.33 MIL/uL — ABNORMAL LOW (ref 3.87–5.11)
Retic Count, Absolute: 133 10*3/uL (ref 19.0–186.0)
Retic Ct Pct: 5.7 % — ABNORMAL HIGH (ref 0.4–3.1)
Reticulocyte Hemoglobin: 28.1 pg (ref >27.9)

## 2021-07-05 LAB — IRON AND IRON BINDING CAPACITY (CC-WL,HP ONLY)
Iron: 51 ug/dL (ref 28–170)
Saturation Ratios: 14 % (ref 10.4–31.8)
TIBC: 356 ug/dL (ref 250–450)
UIBC: 305 ug/dL (ref 148–442)

## 2021-07-05 LAB — FERRITIN: Ferritin: 237 ng/mL (ref 11–307)

## 2021-07-05 LAB — SAMPLE TO BLOOD BANK

## 2021-07-05 MED ORDER — ONDANSETRON HCL 4 MG PO TABS: 4.0000 mg | ORAL_TABLET | Freq: Four times a day (QID) | ORAL | Status: DC | PRN

## 2021-07-05 MED ORDER — MONTELUKAST SODIUM 10 MG PO TABS
10.0000 mg | ORAL_TABLET | Freq: Every day | ORAL | Status: DC
  Administered 2021-07-06 – 2021-07-08 (×2): 10 mg via ORAL
  Filled 2021-07-05 (×2): qty 1

## 2021-07-05 MED ORDER — HEPARIN SOD (PORK) LOCK FLUSH 100 UNIT/ML IV SOLN
250.0000 [IU] | INTRAVENOUS | Status: DC | PRN
  Filled 2021-07-05: qty 2.5

## 2021-07-05 MED ORDER — ONDANSETRON HCL 4 MG/2ML IJ SOLN: 4.0000 mg | Freq: Four times a day (QID) | INTRAMUSCULAR | Status: DC | PRN

## 2021-07-05 MED ORDER — HEPARIN SOD (PORK) LOCK FLUSH 100 UNIT/ML IV SOLN
500.0000 [IU] | Freq: Every day | INTRAVENOUS | Status: DC | PRN
  Filled 2021-07-05: qty 5

## 2021-07-05 MED ORDER — ACETAMINOPHEN 325 MG PO TABS: 650.0000 mg | ORAL_TABLET | Freq: Four times a day (QID) | ORAL | Status: DC | PRN

## 2021-07-05 MED ORDER — FUROSEMIDE 20 MG PO TABS
20.0000 mg | ORAL_TABLET | Freq: Every day | ORAL | Status: DC
  Administered 2021-07-06 – 2021-07-09 (×4): 20 mg via ORAL
  Filled 2021-07-05 (×4): qty 1

## 2021-07-05 MED ORDER — ACETAMINOPHEN 325 MG PO TABS: 650.0000 mg | ORAL_TABLET | Freq: Once | ORAL | Status: DC

## 2021-07-05 MED ORDER — DONEPEZIL HCL 5 MG PO TABS
5.0000 mg | ORAL_TABLET | Freq: Every day | ORAL | Status: DC
  Administered 2021-07-05 – 2021-07-08 (×3): 5 mg via ORAL
  Filled 2021-07-05 (×3): qty 1

## 2021-07-05 MED ORDER — ACETAMINOPHEN 650 MG RE SUPP: 650.0000 mg | Freq: Four times a day (QID) | RECTAL | Status: DC | PRN

## 2021-07-05 MED ORDER — ALBUTEROL SULFATE (2.5 MG/3ML) 0.083% IN NEBU: 2.5000 mg | INHALATION_SOLUTION | RESPIRATORY_TRACT | Status: DC | PRN

## 2021-07-05 MED ORDER — ATORVASTATIN CALCIUM 40 MG PO TABS
40.0000 mg | ORAL_TABLET | Freq: Every day | ORAL | Status: DC
  Administered 2021-07-06 – 2021-07-08 (×2): 40 mg via ORAL
  Filled 2021-07-05 (×2): qty 1

## 2021-07-05 MED ORDER — LEVOTHYROXINE SODIUM 125 MCG PO TABS
125.0000 ug | ORAL_TABLET | Freq: Every day | ORAL | Status: DC
  Administered 2021-07-08 – 2021-07-09 (×2): 125 ug via ORAL
  Filled 2021-07-05 (×2): qty 1

## 2021-07-05 MED ORDER — SODIUM CHLORIDE 0.9% FLUSH
3.0000 mL | INTRAVENOUS | Status: AC | PRN
  Administered 2021-07-07: 3 mL

## 2021-07-05 MED ORDER — PANTOPRAZOLE SODIUM 40 MG IV SOLR
40.0000 mg | Freq: Two times a day (BID) | INTRAVENOUS | Status: DC
  Administered 2021-07-05 – 2021-07-09 (×7): 40 mg via INTRAVENOUS
  Filled 2021-07-05 (×6): qty 40
  Filled 2021-07-05: qty 10

## 2021-07-05 MED ORDER — MOMETASONE FURO-FORMOTEROL FUM 100-5 MCG/ACT IN AERO
2.0000 | INHALATION_SPRAY | Freq: Two times a day (BID) | RESPIRATORY_TRACT | Status: DC
  Administered 2021-07-06 – 2021-07-09 (×7): 2 via RESPIRATORY_TRACT
  Filled 2021-07-05: qty 8.8

## 2021-07-05 MED ORDER — SODIUM CHLORIDE 0.9% FLUSH: 10.0000 mL | INTRAVENOUS | Status: DC | PRN

## 2021-07-05 MED ORDER — SODIUM CHLORIDE 0.9% IV SOLUTION
250.0000 mL | Freq: Once | INTRAVENOUS | Status: AC
  Administered 2021-07-05: 250 mL via INTRAVENOUS

## 2021-07-05 NOTE — H&P (Signed)
History and Physical    Lacey Vega UDJ:[id number] DOB: 07/20/1950 DOA: 07/05/2021  PCP: Rogers Blocker, MD  Patient coming from: Home  I have personally briefly reviewed patient's old medical records in [place]  Chief Complaint: Acute on chronic anemia, dark tarry stool  HPI: Lacey Vega is a 71 y.o. female with medical history significant for iron deficiency anemia due to chronic blood loss secondary to duodenal and gastric AVMs and sickle cell trait, PAD, AAA s/p EVAR complicated by left ischemic limb requiring thrombectomy and left femoral artery stenting, asthma, hypothyroidism, CKD stage IIIa, hyperlipidemia who was directly admitted for evaluation of acute on chronic anemia.  Patient presented to hematology/oncology clinic 07/05/2021 for follow-up with Dr. Lorenso Courier for management of iron deficiency anemia due to chronic blood loss.  Labs are notable for hemoglobin 6.2 and patient reported dark tarry stool.  Type and screen was performed.  Dr. Lorenso Courier discussed with [facility], Dr. Watt Climes, who recommended hospital admission and GI will consult in hospital.  On arrival to floor, order for 2 unit PRBC transfusion was placed by Dr. Lorenso Courier.  Patient reports chronic exertional dyspnea and palpitations with mild activity.  She also reports chronic dark stools, appear more tarry recently.  She does take iron supplements as well as aspirin 81 mg and Plavix 75 mg every morning.  She has not seen any recent red blood in her stool.  She has not had any nausea, vomiting, abdominal pain, dysuria.  She reports chronic swelling in her lower extremities for which she takes Lasix and wears compression stockings.  Review of Systems: All systems reviewed and are negative except as documented in history of present illness above.   Past Medical History:  Diagnosis Date   Anemia    Anxiety    Asthma    Asthma    Bronchitis    hx   Colon polyps    hyperplastic   Diabetes (HCC)    mild    Diabetes mellitus    DJD (degenerative joint disease), cervical    Dysphagia    history of   Dyspnea    Gallstones    GERD (gastroesophageal reflux disease)    H/O chest pain    secondary to anemia   Headache    History of arteriovenous malformation (AVM)    History of hiatal hernia    History of tobacco use    Hyperlipemia    Hypertension    Hypothyroidism    Iron deficiency anemia    Sickle cell anemia ([place])    "patient is not aware of this"   Thyroid disease    TIA (transient ischemic attack)    "was told that she possible had one"    Past Surgical History:  Procedure Laterality Date   ABDOMINAL AORTIC ENDOVASCULAR STENT GRAFT Bilateral 01/31/2021   Procedure: ENDOVASCULAR ANEURYSM REPAIR (EVAR)Bilateral Groin Cutdown, left femoral endaterectomy with bovine patch angioplasty.;  Surgeon: Cherre Robins, MD;  Location: [facility];  Service: Vascular;  Laterality: Bilateral;   ABDOMINAL HYSTERECTOMY     ANTERIOR CERVICAL DECOMP/DISCECTOMY FUSION N/A 10/03/2016   Procedure: ANTERIOR CERVICAL DECOMPRESSION/DISCECTOMY FUSION CERVICAL THREE- CERVICAL FOUR;  Surgeon: Ashok Pall, MD;  Location: [place];  Service: Neurosurgery;  Laterality: N/A;  Right side approach   BOTOX INJECTION  08/29/2015   Procedure: BOTOX INJECTION;  Surgeon: Wilford Corner, MD;  Location: [place];  Service: Endoscopy;;   CHOLECYSTECTOMY     COLONOSCOPY  12/03/2011   Procedure: COLONOSCOPY;  Surgeon: [name], MD;  Location: [facility];  Service: Endoscopy;  Laterality: N/A;   ESOPHAGEAL MANOMETRY N/A 07/13/2017   Procedure: ESOPHAGEAL MANOMETRY (EM);  Surgeon: Wilford Corner, MD;  Location: [facility];  Service: Endoscopy;  Laterality: N/A;   ESOPHAGOGASTRODUODENOSCOPY  12/03/2011   Procedure: ESOPHAGOGASTRODUODENOSCOPY (EGD);  Surgeon: [name], MD;  Location: [facility];  Service: Endoscopy;  Laterality: N/A;   ESOPHAGOGASTRODUODENOSCOPY (EGD) WITH PROPOFOL N/A 08/29/2015    Procedure: ESOPHAGOGASTRODUODENOSCOPY (EGD) WITH PROPOFOL;  Surgeon: Wilford Corner, MD;  Location: [facility] END[facility]COPY;  Service: Endoscopy;  Laterality: N/A;   ESOPHAGOGASTRODUODENOSCOPY (EGD) WITH PROPOFOL N/A 05/17/2019   Procedure: ESOPHAGOGASTRODUODENOSCOPY (EGD) WITH PROPOFOL;  Surgeon: Wilford Corner, MD;  Location: [facility];  Service: Endoscopy;  Laterality: N/A;   ESOPHAGOGASTRODUODENOSCOPY (EGD) WITH PROPOFOL N/A 04/24/2021   Procedure: ESOPHAGOGASTRODUODENOSCOPY (EGD) WITH PROPOFOL;  Surgeon: Otis Brace, MD;  Location: [place];  Service: Gastroenterology;  Laterality: N/A;   GIVENS CAPSULE STUDY N/A 08/05/2018   Procedure: GIVENS CAPSULE STUDY;  Surgeon: Wilford Corner, MD;  Location: [place];  Service: Endoscopy;  Laterality: N/A;   HOT HEMOSTASIS  12/03/2011   Procedure: HOT HEMOSTASIS (ARGON PLASMA COAGULATION/BICAP);  Surgeon: [name], MD;  Location: [facility];  Service: Endoscopy;  Laterality: N/A;   NECK SURGERY     PERIPHERAL VASCULAR INTERVENTION  03/29/2021   Procedure: PERIPHERAL VASCULAR INTERVENTION;  Surgeon: Cherre Robins, MD;  Location: [place] [facility];  Service: Cardiovascular;;  Lt SFA Brachial Approach   ROTATOR CUFF REPAIR     right   ROTATOR CUFF REPAIR Right 2014   THROMBECTOMY FEMORAL ARTERY Left 01/31/2021   Procedure: THROMBECTOMY POPLITEAL  ARTERY;  Surgeon: Cherre Robins, MD;  Location: [place];  Service: Vascular;  Laterality: Left;   TONSILLECTOMY      Social History:  reports that she quit smoking about 21 years ago. Her smoking use included cigarettes. She has never used smokeless tobacco. She reports that she does not drink alcohol and does not use drugs.  Allergies  Allergen Reactions   Oxycodone Shortness Of Breath    Family History  Problem Relation Age of Onset   Diabetes Mother    Heart attack Brother    Cancer Maternal Aunt      Prior to Admission medications   Medication Sig Start Date End  Date Taking? Authorizing Provider  aspirin EC 81 MG tablet Take 1 tablet (81 mg total) by mouth daily. **Start on 11/25** 04/26/21   Dessa Phi, DO  atorvastatin (LIPITOR) 40 MG tablet Take 40 mg by mouth daily.    [provider]  carisoprodol (SOMA) 250 MG tablet Take 250 mg by mouth 2 (two) times daily as needed (cramps).  05/04/19   [provider]  CINNAMON PO Take 1,000 mg by mouth daily.    [provider]  clopidogrel (PLAVIX) 75 MG tablet Take 1 tablet (75 mg total) by mouth daily. **Start on 11/25** 04/26/21 04/26/22  Dessa Phi, DO  docusate sodium (COLACE) 100 MG capsule Take 100 mg by mouth daily.    [provider]  donepezil (ARICEPT) 5 MG tablet Take 5 mg by mouth at bedtime.    [provider]  ferrous sulfate ([employer] IRON) 325 (65 FE) MG tablet TAKE 1 TABLET EVERY DAY . PLEASE TAKE 1 PILL DAILY WITH ORANGE JUICE OR 500 UNITS OF VITAMIN C Patient taking differently: Take 325 mg by mouth daily with breakfast. PLEASE TAKE 1 PILL DAILY WITH ORANGE JUICE OR  500 UNITS OF VITAMIN C 03/10/21   Orson Slick, MD  furosemide (LASIX) 20 MG tablet Take 20 mg by mouth daily.     [provider]  levothyroxine (SYNTHROID) 125 MCG tablet Take 125 mcg by mouth daily before breakfast.    [provider]  Magnesium Oxide 420 (252 Mg) MG TABS Take 420 mg by mouth daily.    [provider]  montelukast (SINGULAIR) 10 MG tablet Take 10 mg by mouth daily.      [provider]  Multiple Vitamins-Minerals (HAIR/SKIN/NAILS/BIOTIN PO) Take 100 mg by mouth daily.    [provider]  nitroGLYCERIN (NITROSTAT) 0.4 MG SL tablet Place 0.4 mg under the tongue every 5 (five) minutes as needed for chest pain.    [provider]  Omega-3 Fatty Acids (FISH OIL BURP-LESS) 1200 MG CAPS Take 2 capsules by mouth daily.    [provider]  pantoprazole (PROTONIX) 40 MG tablet 40 mg twice a day for 4 weeks  followed by Protonix 40 mg once a day. 04/24/21   Dessa Phi, DO    Physical Exam: Vitals:   07/05/21 2100  BP: (!) 117/52  Pulse: 96  Resp: 18  Temp: 98.6 F (37 C)  TempSrc: Oral  SpO2: 98%   Constitutional: Resting in bed, NAD, calm, comfortable Eyes: PERRL, lids and conjunctivae normal ENMT: Mucous membranes are moist. Posterior pharynx clear of any exudate or lesions.Normal dentition.  Neck: normal, supple, no masses. Respiratory: clear to auscultation bilaterally, no wheezing, no crackles. Normal respiratory effort. No accessory muscle use.  Cardiovascular: Regular rate and rhythm, no murmurs / rubs / gallops. No extremity edema. 2+ pedal pulses. Abdomen: Mild upper abdominal tenderness to palpation, no masses palpated. No hepatosplenomegaly. Bowel sounds positive.  Musculoskeletal: no clubbing / cyanosis. No joint deformity upper and lower extremities. Good ROM, no contractures. Normal muscle tone.  Skin: no rashes, lesions, ulcers. No induration Neurologic: CN 2-12 grossly intact. Sensation intact. Strength 5/5 in all 4.  Psychiatric: Normal judgment and insight. Alert and oriented x 3. Normal mood.   EKG: Not performed.  Assessment/Plan Principal Problem:   Acute on chronic blood loss anemia Active Problems:   PAD (peripheral artery disease) (HCC)   Asthma with COPD ([place])   Chronic kidney disease, stage 3a ([place])   Hypothyroidism (acquired)   Lacey Vega is a 71 y.o. 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  Assessment and Plan: * Acute on chronic blood loss anemia- (present on admission) Hx  gastric and duodenal AVMs Iron deficiency due to chronic blood loss Direct admit from hematology office with hemoglobin 6.2 and dark tarry stool.  She is symptomatic with exertional dyspnea.  Last EGD 04/24/2021 showed gastritis with hemorrhage. -Transfuse 2 unit PRBCs -Start IV Protonix 40 mg twice daily -Keep n.p.o. after midnight -Hold aspirin and Plavix -GI aware, plan for capsule endoscopy  PAD (peripheral artery disease) ([place])- (present on admission) Hx of AAA status post repair, complicated by left ischemic limb requiring thrombectomy and left femoral artery stenting Holding aspirin and Plavix as above.  Continue atorvastatin.  Asthma with COPD ([place])- (present on admission) Stable without acute exacerbation.  Continue Dulera and albuterol as needed.  Chronic kidney disease, stage 3a ([place])- (present on admission) Stable, continue to monitor.  Hypothyroidism (acquired)- (present on admission) Continue Synthroid.  DVT prophylaxis: SCDs Start: 07/05/21 1935 Code Status: Full code, confirmed with patient on admission Family Communication: Discussed with patient, she has discussed with family Disposition Plan: From home and likely discharge to home pending clinical progress Consults called: GI Severity of Illness: The appropriate patient status for this patient is OBSERVATION. Observation status is judged to be reasonable and necessary in order to provide the required intensity of service to ensure the patient's safety. The patient's presenting symptoms, physical exam findings, and initial radiographic and laboratory data in the context of their medical condition is felt to place them at decreased risk for further clinical deterioration. Furthermore, it is anticipated that the patient will be medically stable for discharge from the hospital within 2 midnights of admission.   Zada Finders MD [facility]  If 7PM-7AM, please contact night-coverage [email or web address]  07/05/2021, 9:40  PM

## 2021-07-05 NOTE — Assessment & Plan Note (Addendum)
History of gastric and duodenal AVMs. Also with iron deficiency secondary to chronic blood loss. Hemoglobin of 6.2 on admission. 2 units of PRBC transfused with post-transfusion hemoglobin of 8.2. GI consulted and recommended a capsule endoscopy study which was significant for evidence of duodenal angioectasia. Small bowel enteroscopy performed on 2/5 and was significant for a non-bleeding AVM in the stomach and three non-bleeding AVMs, all treated with APC. Hemoglobin has remained stable and is 8.3 on day of discharge. Aspirin and Plavix restarted prior to discharge. Patient to follow-up with GI as an outpatient.

## 2021-07-05 NOTE — Progress Notes (Signed)
Plan of Care Note for accepted transfer   Patient: Nohelia Valenza MRN: [id number]   DOA: (Not on file)  Facility requesting transfer: Medical oncology clinic Requesting Provider: Dr. Lorenso Courier Reason for transfer: Symptomatic anemia Facility course: Routine follow-up for iron deficiency anemia, hemoglobin 6.2, dark tarry stool, Dr. Lorenso Courier talk to [facility] Dr. Watt Climes who recommend hospital admission, GI will see patient in consult, Dr. Lorenso Courier recommend 2 units PRBC transfusion after admission  Plan of care: The patient is accepted for admission to Telemetry unit, at [facility]..    Author: Florencia Reasons, MD PhD FACP 07/05/2021  Check [email or web address] for on-call coverage.  Nursing staff, Please call [place] number on Amion as soon as patient's arrival, so appropriate admitting provider can evaluate the pt.

## 2021-07-05 NOTE — Assessment & Plan Note (Addendum)
Stable

## 2021-07-05 NOTE — Telephone Encounter (Signed)
Patient admitted to room 1511/ 5 E. Report given to RN receiving pt.

## 2021-07-05 NOTE — Assessment & Plan Note (Addendum)
Continue Synthroid °

## 2021-07-05 NOTE — Progress Notes (Signed)
Pt arrived from Cancer center as a direct admit to room 1511 at 1730. Pt arrived with orders to receive 2 units of PRBCs. No IV access. RN was unable to access pts chart until after 1800. IV consult placed and MD made aware.

## 2021-07-05 NOTE — Progress Notes (Signed)
[place] [phone]   [phone]  PROGRESS NOTE  Patient Care Team: Rogers Blocker, MD as PCP - General (Internal Medicine)  Hematological/Oncological History # Iron Deficiency Anemia due to Chronic Blood Loss # Sickle Cell Trait  1) 03/26/2009: Hgb 7.2, first Hgb on record 2) 09/02/2011: 2 units of PRBC at [facility] (Hgb noted at 8.9).  3)  12/03/2011: colonoscopy, found colon polyp and sigmoid diverticula. 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 Underwent argon plasma coagulation  4) Jan 2018: colonoscopy with 2 tubular adenomas removed. Small colonic AVM and small internal hemorrhoids.  5)10/24/2017: Hgb 7.7, MCV 71, Plt 338, and WBC 6.7. POEM procedure for Achalasia. duodenal AVM fulgurated.  6) 03/2018: Hgb 8.2.  7) March 2020: capsule endoscopy: nonbleeding gastric and proximal small bowel AVM,  8) 04/26/2019: Establish Care with Dr. Lorenso Courier  WBC 6.1, Hgb 9.3, MCV 75.3, Plt 289 9) 05/17/2019: Underwent EGD which showed esophagitis and small non-bleeding duodenal angiodysplasias. 10) 07/27/2019: WBC 5.9, Hgb 9.8, MCV 77.8, Plt 271. Recommend IV iron based on labs. Received feraheme 510mg  IV q7 days x 2 doses on 3/8 and 08/15/2019. Hgb electrophoresis confirms sickle cell trait.  11) 10/24/2019: WBC 5.4, Hgb 10.2, MCV 80.5, Plt 252 12) 02/15/2020: WBC 5.1, Hgb 9.7, MCV 80.2, Plt 280 13) 9/24-10/06/2019: IV feraheme 510mg  x 2 doses 14) 04/22/2021: Patient presented to emergency department with heavy nosebleeding.  Had a syncopal episode in the emergency department.  Discharged on 04/24/2021. Hgb 7.2 on admission.  15) 07/05/2021: presented to clinic with Hgb 6.2, MCV 88.9 and Plt 286. Admitted for GI workup.   Interval History:  Lacey Vega 71 y.o. female with medical history significant for iron deficiency anemia 2/2 to chronic blood loss/ sickle cell trait who presents for a follow up visit. The patient's last visit was on  05/08/2021. In the interim since the last visit Lacey Vega continues to require frequent blood transfusions.   On exam today Lacey Vega notes that she continues to struggle with dark tarry stools.  She notes that it is quite sticky and black.  She notes that she feels "overly tired" and has been suffering from racing heart.  She is not seen bleeding from any other sources at this time.  She does endorse any abdominal pain or discomfort.  She notes that she is "fed up" with having to require constant blood transfusions.  She denies dizziness, shortness of breath, or chest pain.  She has had no issues with fevers, chills, sweats, nausea, vomiting or diarrhea.  A full 10 point ROS is listed below.  Due to concern for black tarry stools and hemoglobin of 6.2 we requested the patient be admitted for GI evaluation.  MEDICAL HISTORY:  Past Medical History:  Diagnosis Date   Anemia    Anxiety    Asthma    Asthma    Bronchitis    hx   Colon polyps    hyperplastic   Diabetes ([place])    mild   Diabetes mellitus    DJD (degenerative joint disease), cervical    Dysphagia    history of   Dyspnea    Gallstones    GERD (gastroesophageal reflux disease)    H/O chest pain    secondary to anemia   Headache    History of arteriovenous malformation (AVM)    History of hiatal hernia    History of tobacco use    Hyperlipemia    Hypertension  Hypothyroidism    Iron deficiency anemia    Sickle cell anemia ([place])    "patient is not aware of this"   Thyroid disease    TIA (transient ischemic attack)    "was told that she possible had one"    SURGICAL HISTORY: Past Surgical History:  Procedure Laterality Date   ABDOMINAL AORTIC ENDOVASCULAR STENT GRAFT Bilateral 01/31/2021   Procedure: ENDOVASCULAR ANEURYSM REPAIR (EVAR)Bilateral Groin Cutdown, left femoral endaterectomy with bovine patch angioplasty.;  Surgeon: Cherre Robins, MD;  Location: [place];  Service: Vascular;  Laterality: Bilateral;    ABDOMINAL HYSTERECTOMY     ANTERIOR CERVICAL DECOMP/DISCECTOMY FUSION N/A 10/03/2016   Procedure: ANTERIOR CERVICAL DECOMPRESSION/DISCECTOMY FUSION CERVICAL THREE- CERVICAL FOUR;  Surgeon: Ashok Pall, MD;  Location: [place];  Service: Neurosurgery;  Laterality: N/A;  Right side approach   BOTOX INJECTION  08/29/2015   Procedure: BOTOX INJECTION;  Surgeon: Wilford Corner, MD;  Location: [place];  Service: Endoscopy;;   CHOLECYSTECTOMY     COLONOSCOPY  12/03/2011   Procedure: COLONOSCOPY;  Surgeon: [name], MD;  Location: [facility];  Service: Endoscopy;  Laterality: N/A;   ESOPHAGEAL MANOMETRY N/A 07/13/2017   Procedure: ESOPHAGEAL MANOMETRY (EM);  Surgeon: Wilford Corner, MD;  Location: [facility];  Service: Endoscopy;  Laterality: N/A;   ESOPHAGOGASTRODUODENOSCOPY  12/03/2011   Procedure: ESOPHAGOGASTRODUODENOSCOPY (EGD);  Surgeon: [name], MD;  Location: [facility];  Service: Endoscopy;  Laterality: N/A;   ESOPHAGOGASTRODUODENOSCOPY (EGD) WITH PROPOFOL N/A 08/29/2015   Procedure: ESOPHAGOGASTRODUODENOSCOPY (EGD) WITH PROPOFOL;  Surgeon: Wilford Corner, MD;  Location: [facility];  Service: Endoscopy;  Laterality: N/A;   ESOPHAGOGASTRODUODENOSCOPY (EGD) WITH PROPOFOL N/A 05/17/2019   Procedure: ESOPHAGOGASTRODUODENOSCOPY (EGD) WITH PROPOFOL;  Surgeon: Wilford Corner, MD;  Location: [facility];  Service: Endoscopy;  Laterality: N/A;   ESOPHAGOGASTRODUODENOSCOPY (EGD) WITH PROPOFOL N/A 04/24/2021   Procedure: ESOPHAGOGASTRODUODENOSCOPY (EGD) WITH PROPOFOL;  Surgeon: Otis Brace, MD;  Location: [place];  Service: Gastroenterology;  Laterality: N/A;   GIVENS CAPSULE STUDY N/A 08/05/2018   Procedure: GIVENS CAPSULE STUDY;  Surgeon: Wilford Corner, MD;  Location: [place];  Service: Endoscopy;  Laterality: N/A;   HOT HEMOSTASIS  12/03/2011   Procedure: HOT HEMOSTASIS (ARGON PLASMA COAGULATION/BICAP);  Surgeon: [name], MD;  Location:  [facility];  Service: Endoscopy;  Laterality: N/A;   NECK SURGERY     PERIPHERAL VASCULAR INTERVENTION  03/29/2021   Procedure: PERIPHERAL VASCULAR INTERVENTION;  Surgeon: Cherre Robins, MD;  Location: [facility];  Service: Cardiovascular;;  Lt SFA Brachial Approach   ROTATOR CUFF REPAIR     right   ROTATOR CUFF REPAIR Right 2014   THROMBECTOMY FEMORAL ARTERY Left 01/31/2021   Procedure: THROMBECTOMY POPLITEAL  ARTERY;  Surgeon: Cherre Robins, MD;  Location: [facility];  Service: Vascular;  Laterality: Left;   TONSILLECTOMY      SOCIAL HISTORY: Social History   Socioeconomic History   Marital status: Divorced    Spouse name: Not on file   Number of children: 2   Years of education: Not on file   Highest education level: Not on file  Occupational History   Occupation: group leader environmental services    Employer: [employer]    Comment: [employer]  Tobacco Use   Smoking status: Former    Types: Cigarettes    Quit date: 06/02/2000    Years since quitting: 21.1   Smokeless tobacco: Never  Vaping Use   Vaping Use: Never used  Substance and Sexual Activity   Alcohol use: No   Drug use: No   Sexual activity: Not on file  Other Topics Concern   Not on file  Social History Narrative   ** Merged History Encounter **       Social Determinants of Health   Financial Resource Strain: Not on file  Food Insecurity: Not on file  Transportation Needs: Not on file  Physical Activity: Not on file  Stress: Not on file  Social Connections: Not on file  Intimate Partner Violence: Not on file    FAMILY HISTORY: Family History  Problem Relation Age of Onset   Diabetes Mother    Heart attack Brother    Cancer Maternal Aunt     ALLERGIES:  is allergic to oxycodone.  MEDICATIONS:  No current facility-administered medications for this visit.   No current outpatient medications on file.   Facility-Administered Medications Ordered in Other Visits   Medication Dose Route Frequency Provider Last Rate Last Admin   0.9 %  sodium chloride infusion   Intravenous Continuous Wilford Corner, MD       acetaminophen (TYLENOL) tablet 650 mg  650 mg Oral Q6H PRN Lenore Cordia, MD       Or   acetaminophen (TYLENOL) suppository 650 mg  650 mg Rectal Q6H PRN Lenore Cordia, MD       albuterol (PROVENTIL) (2.5 MG/3ML) 0.083% nebulizer solution 2.5 mg  2.5 mg Nebulization Q4H PRN Lenore Cordia, MD       atorvastatin (LIPITOR) tablet 40 mg  40 mg Oral Daily Zada Finders R, MD       donepezil (ARICEPT) tablet 5 mg  5 mg Oral QHS Zada Finders R, MD   5 mg at 07/05/21 2254   furosemide (LASIX) tablet 20 mg  20 mg Oral Daily Zada Finders R, MD       heparin lock flush 100 unit/mL  500 Units Intracatheter Daily PRN Ledell Peoples IV, MD       heparin lock flush 100 unit/mL  250 Units Intracatheter PRN Orson Slick, MD       levothyroxine (SYNTHROID) tablet 125 mcg  125 mcg Oral QAC breakfast Lenore Cordia, MD       mometasone-formoterol (DULERA) 100-5 MCG/ACT inhaler 2 puff  2 puff Inhalation BID Lenore Cordia, MD   2 puff at 07/06/21 0902   montelukast (SINGULAIR) tablet 10 mg  10 mg Oral Daily Lenore Cordia, MD       ondansetron (ZOFRAN) tablet 4 mg  4 mg Oral Q6H PRN Lenore Cordia, MD       Or   ondansetron (ZOFRAN) injection 4 mg  4 mg Intravenous Q6H PRN Lenore Cordia, MD       pantoprazole (PROTONIX) injection 40 mg  40 mg Intravenous Q12H Zada Finders R, MD   40 mg at 07/06/21 1053   sodium chloride flush (NS) 0.9 % injection 10 mL  10 mL Intracatheter PRN Ledell Peoples IV, MD       sodium chloride flush (NS) 0.9 % injection 3 mL  3 mL Intracatheter PRN Orson Slick, MD        REVIEW OF SYSTEMS:   Constitutional: ( - ) fevers, ( - )  chills , ( - ) night sweats (+) fatigue Eyes: ( - ) blurriness of vision, ( - ) double vision, ( - ) watery eyes Ears, nose, mouth, throat, and face: ( - )  mucositis, ( - ) sore  throat Respiratory: ( - ) cough, ( - ) dyspnea, ( - ) wheezes Cardiovascular: ( - ) palpitation, ( - ) chest discomfort, ( - ) lower extremity swelling Gastrointestinal:  ( - ) nausea, ( - ) heartburn, ( - ) change in bowel habits Skin: ( - ) abnormal skin rashes Lymphatics: ( - ) new lymphadenopathy, ( - ) easy bruising Neurological: ( - ) numbness, ( - ) tingling, ( - ) new weaknesses Behavioral/Psych: ( - ) mood change, ( - ) new changes  All other systems were reviewed with the patient and are negative.  PHYSICAL EXAMINATION: ECOG PERFORMANCE STATUS: 1 - Symptomatic but completely ambulatory  Vitals:   07/05/21 1401  BP: (!) 115/51  Pulse: (!) 102  Resp: 16  Temp: 98.2 F (36.8 C)  SpO2: 97%   Filed Weights   07/05/21 1401  Weight: 181 lb (82.1 kg)    GENERAL: well appearing elderly African American female in NAD  SKIN: skin color, texture, turgor are normal, no rashes or significant lesions EYES: conjunctiva are pink and non-injected, sclera clear LUNGS: clear to auscultation and percussion with normal breathing effort HEART: regular rate & rhythm and no murmurs and no lower extremity edema Musculoskeletal: no cyanosis of digits and no clubbing  PSYCH: alert & oriented x 3, fluent speech NEURO: no focal motor/sensory deficits  LABORATORY DATA:  I have reviewed the data as listed CBC Latest Ref Rng & Units 07/06/2021 07/05/2021 06/05/2021  WBC 4.0 - 10.5 K/uL - 7.0 6.7  Hemoglobin 12.0 - 15.0 g/dL 8.2(L) 6.2(LL) 5.7(LL)  Hematocrit 36.0 - 46.0 % 26.3(L) 20.9(L) 18.7(L)  Platelets 150 - 400 K/uL - 286 348    CMP Latest Ref Rng & Units 07/05/2021 06/05/2021 05/08/2021  Glucose 70 - 99 mg/dL 121(H) 103(H) 102(H)  BUN 8 - 23 mg/dL 22 22 20   Creatinine 0.44 - 1.00 mg/dL 1.07(H) 1.03(H) 1.08(H)  Sodium 135 - 145 mmol/L 141 142 144  Potassium 3.5 - 5.1 mmol/L 3.9 4.0 4.0  Chloride 98 - 111 mmol/L 105 108 109  CO2 22 - 32 mmol/L 30 30 24   Calcium 8.9 - 10.3 mg/dL 8.9 9.0 8.7(L)   Total Protein 6.5 - 8.1 g/dL 6.4(L) 6.6 7.0  Total Bilirubin 0.3 - 1.2 mg/dL 0.5 0.5 0.6  Alkaline Phos 38 - 126 U/L 100 101 97  AST 15 - 41 U/L 18 18 15   ALT 0 - 44 U/L 10 11 9     RADIOGRAPHIC STUDIES: None relevant to review.  No results found.  ASSESSMENT & [place] 71 y.o. female with medical history significant for iron deficiency anemia 2/2 to chronic blood loss presents for a follow up visit.  Lacey Vega establish care with [place] in November 2020 and has been taking p.o. iron since that time.  When her levels failed to improve at her last visit she was started on IV Iron therapy and received IV feraheme 510mg  x 2 doses on 3/8 and 08/15/19. She received additional doses on 9/24 and 03/02/2020.  She had a hospital admission from 04/22/2021 until 04/24/2021 for epistaxis.  In clinic on 05/09/2019 the patient was found to have hemoglobin 6.3.  Transfusion was arranged for 2 units packed red blood cells on 05/09/2021.  #Iron Deficiency Anemia from Chronic Blood Loss --continue PO ferrous sulfide 325mg  daily with source of vitamin C. --source of blood loss previously identified as AVMs in the GI tract. Likely a strong contribution from  the patient's sickle cell trait preventing her from reaching a normal Hgb. Baseline Hgb is  approximately 10.  --transfusion goal for Hgb <7.0. Hgb today at 6.2, questing admission and GI evaluation. --patient received IV feraheme 510mg  x 2 dose on 9/24-10/06/2019 --RTC pending work-up and subsequent findings after discharge from the hospital   #Sickle Cell Trait -- Hgb electrophoresis confirmed sickle cell trait on 07/27/2019. --like a strong contributing factor to the patients persistently low Hgb.  Most likely her baseline hemoglobin is approximately 10.0   #Bleeding AVM/GI Bleeding --currently followed by [facility] --last EGD on 05/17/2019, showed esophagitis and small non-bleeding duodenal angiodysplasias.  -- [facility] notified upon patient  admission on 07/05/2021  No orders of the defined types were placed in this encounter.  All questions were answered. The patient knows to call the clinic with any problems, questions or concerns.  A total of more than 30 minutes were spent on this encounter and over half of that time was spent on counseling and coordination of care as outlined above.   Ledell Peoples, MD Department of Hematology/Oncology [place] at [facility] Phone: [phone] Pager: [phone] Email: [email or web address]  07/06/2021 12:59 PM

## 2021-07-05 NOTE — Assessment & Plan Note (Addendum)
Stable. Continue Dulera, Singulair, Albuterol.

## 2021-07-05 NOTE — Assessment & Plan Note (Addendum)
Patient with left femoral artery stenting in 03/2021. Patient started on DAPT with recommendations for only one month treatment of Plavix. Aspirin and Plavix held on admission. Restarted day of discharge after cleared by GI service.

## 2021-07-05 NOTE — Hospital Course (Addendum)
Lacey Vega is a 71 y.o. 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 Patient received a 2 unit PRBC transfusion and capsule endoscopy performed which was significant for duodenal AVM. Small bowel enteroscopy was significant for gastric/duodenal AVMs, treated with APC. Hemoglobin stable prior to discharge.

## 2021-07-06 ENCOUNTER — Encounter (HOSPITAL_COMMUNITY)
Admission: RE | Disposition: A | Discharge status: Home / Self Care | Payer: Self-pay | Source: Ambulatory Visit | Attending: Family Medicine

## 2021-07-06 ENCOUNTER — Encounter: Payer: Self-pay | Admitting: Hematology and Oncology

## 2021-07-06 ENCOUNTER — Encounter (HOSPITAL_COMMUNITY): Payer: Self-pay | Admitting: Internal Medicine

## 2021-07-06 ENCOUNTER — Inpatient Hospital Stay: Payer: Medicare HMO

## 2021-07-06 DIAGNOSIS — J449 Chronic obstructive pulmonary disease, unspecified: Secondary | ICD-10-CM | POA: Diagnosis not present

## 2021-07-06 DIAGNOSIS — D62 Acute posthemorrhagic anemia: Secondary | ICD-10-CM

## 2021-07-06 DIAGNOSIS — N1831 Chronic kidney disease, stage 3a: Secondary | ICD-10-CM

## 2021-07-06 DIAGNOSIS — I739 Peripheral vascular disease, unspecified: Secondary | ICD-10-CM

## 2021-07-06 DIAGNOSIS — E039 Hypothyroidism, unspecified: Secondary | ICD-10-CM

## 2021-07-06 HISTORY — PX: GIVENS CAPSULE STUDY: SHX5432

## 2021-07-06 LAB — BASIC METABOLIC PANEL
Anion gap: 7 (ref 5–15)
BUN: 19 mg/dL (ref 8–23)
CO2: 27 mmol/L (ref 22–32)
Calcium: 8.5 mg/dL — ABNORMAL LOW (ref 8.9–10.3)
Chloride: 105 mmol/L (ref 98–111)
Creatinine, Ser: 0.87 mg/dL (ref 0.44–1.00)
GFR, Estimated: >60 mL/min (ref >60)
Glucose, Bld: 98 mg/dL (ref 70–99)
Potassium: 3.8 mmol/L (ref 3.5–5.1)
Sodium: 139 mmol/L (ref 135–145)

## 2021-07-06 LAB — CBC
HCT: 26 % — ABNORMAL LOW (ref 36.0–46.0)
Hemoglobin: 8.1 g/dL — ABNORMAL LOW (ref 12.0–15.0)
MCH: 27.5 pg (ref 26.0–34.0)
MCHC: 31.2 g/dL (ref 30.0–36.0)
MCV: 88.1 fL (ref 80.0–100.0)
Platelets: 237 10*3/uL (ref 150–400)
RBC: 2.95 MIL/uL — ABNORMAL LOW (ref 3.87–5.11)
RDW: 17.1 % — ABNORMAL HIGH (ref 11.5–15.5)
WBC: 5.6 10*3/uL (ref 4.0–10.5)
nRBC: 0 % (ref 0.0–0.2)

## 2021-07-06 LAB — HEMOGLOBIN AND HEMATOCRIT, BLOOD
HCT: 26.3 % — ABNORMAL LOW (ref 36.0–46.0)
Hemoglobin: 8.2 g/dL — ABNORMAL LOW (ref 12.0–15.0)

## 2021-07-06 LAB — PROTIME-INR
INR: 1.1 (ref 0.8–1.2)
Prothrombin Time: 13.7 seconds (ref 11.4–15.2)

## 2021-07-06 LAB — RESP PANEL BY RT-PCR (FLU A&B, COVID) ARPGX2
Influenza A by PCR: NEGATIVE
Influenza B by PCR: NEGATIVE
SARS Coronavirus 2 by RT PCR: NEGATIVE

## 2021-07-06 SURGERY — ESOPHAGOGASTRODUODENOSCOPY (EGD) WITH PROPOFOL
Anesthesia: Monitor Anesthesia Care

## 2021-07-06 SURGERY — IMAGING PROCEDURE, GI TRACT, INTRALUMINAL, VIA CAPSULE
Anesthesia: LOCAL

## 2021-07-06 MED ORDER — SODIUM CHLORIDE 0.9 % IV SOLN: INTRAVENOUS | Status: DC

## 2021-07-06 SURGICAL SUPPLY — 1 items: TOWEL COTTON PACK 4EA (MISCELLANEOUS) ×6 IMPLANT

## 2021-07-06 NOTE — Progress Notes (Signed)
Given's capsule administered at 0942. Leads were applied and patient given instructions on wearing leads and carrying device near her until 2142 today. Instructed patient on directions for eating and drinking. Patient verbalized understanding.

## 2021-07-06 NOTE — Consult Note (Signed)
Reason for Consult: Chronic GI blood loss Referring Physician: Hospital team and hematology  Lacey Vega is an 71 y.o. female.  HPI: Patient seen and examined and discussed with Dr. Lorenso Courier and the hospital team and her office computer chart and hospital computer chart reviewed and she has no GI complaints except for black stools but is on oral iron and her previous work-up was reviewed and she is currently in the middle of a capsule endoscopy which was deemed her next test since her last upper endoscopy did not show any duodenal AVMs and she is followed by my partner Dr. Michail Sermon and has no other complaints and is feeling better after her transfusion  Past Medical History:  Diagnosis Date   Anemia    Anxiety    Asthma    Asthma    Bronchitis    hx   Colon polyps    hyperplastic   Diabetes ([place])    mild   Diabetes mellitus    DJD (degenerative joint disease), cervical    Dysphagia    history of   Dyspnea    Gallstones    GERD (gastroesophageal reflux disease)    H/O chest pain    secondary to anemia   Headache    History of arteriovenous malformation (AVM)    History of hiatal hernia    History of tobacco use    Hyperlipemia    Hypertension    Hypothyroidism    Iron deficiency anemia    Sickle cell anemia ([place])    "patient is not aware of this"   Thyroid disease    TIA (transient ischemic attack)    "was told that she possible had one"    Past Surgical History:  Procedure Laterality Date   ABDOMINAL AORTIC ENDOVASCULAR STENT GRAFT Bilateral 01/31/2021   Procedure: ENDOVASCULAR ANEURYSM REPAIR (EVAR)Bilateral Groin Cutdown, left femoral endaterectomy with bovine patch angioplasty.;  Surgeon: Cherre Robins, MD;  Location: [facility];  Service: Vascular;  Laterality: Bilateral;   ABDOMINAL HYSTERECTOMY     ANTERIOR CERVICAL DECOMP/DISCECTOMY FUSION N/A 10/03/2016   Procedure: ANTERIOR CERVICAL DECOMPRESSION/DISCECTOMY FUSION CERVICAL THREE- CERVICAL FOUR;  Surgeon:  Ashok Pall, MD;  Location: [place];  Service: Neurosurgery;  Laterality: N/A;  Right side approach   BOTOX INJECTION  08/29/2015   Procedure: BOTOX INJECTION;  Surgeon: Wilford Corner, MD;  Location: [place];  Service: Endoscopy;;   CHOLECYSTECTOMY     COLONOSCOPY  12/03/2011   Procedure: COLONOSCOPY;  Surgeon: [name], MD;  Location: [facility];  Service: Endoscopy;  Laterality: N/A;   ESOPHAGEAL MANOMETRY N/A 07/13/2017   Procedure: ESOPHAGEAL MANOMETRY (EM);  Surgeon: Wilford Corner, MD;  Location: [facility];  Service: Endoscopy;  Laterality: N/A;   ESOPHAGOGASTRODUODENOSCOPY  12/03/2011   Procedure: ESOPHAGOGASTRODUODENOSCOPY (EGD);  Surgeon: [name], MD;  Location: [facility];  Service: Endoscopy;  Laterality: N/A;   ESOPHAGOGASTRODUODENOSCOPY (EGD) WITH PROPOFOL N/A 08/29/2015   Procedure: ESOPHAGOGASTRODUODENOSCOPY (EGD) WITH PROPOFOL;  Surgeon: Wilford Corner, MD;  Location: [facility] ENDOSCOPY;  Service: Endoscopy;  Laterality: N/A;   ESOPHAGOGASTRODUODENOSCOPY (EGD) WITH PROPOFOL N/A 05/17/2019   Procedure: ESOPHAGOGASTRODUODENOSCOPY (EGD) WITH PROPOFOL;  Surgeon: Wilford Corner, MD;  [facility];  Service: Endoscopy;  Laterality: N/A;   ESOPHAGOGASTRODUODENOSCOPY (EGD) WITH PROPOFOL N/A 04/24/2021   Procedure: ESOPHAGOGASTRODUODENOSCOPY (EGD) WITH PROPOFOL;  Surgeon: Otis Brace, MD;  Location: [place];  Service: Gastroenterology;  Laterality: N/A;   GIVENS CAPSULE STUDY N/A 08/05/2018   Procedure: GIVENS CAPSULE STUDY;  Surgeon: Wilford Corner, MD;  Location: [facility];  Service: Endoscopy;  Laterality: N/A;   HOT HEMOSTASIS  12/03/2011   Procedure: HOT HEMOSTASIS (ARGON PLASMA COAGULATION/BICAP);  Surgeon: [name], MD;  Location: [facility];  Service: Endoscopy;  Laterality: N/A;   NECK SURGERY     PERIPHERAL VASCULAR INTERVENTION  03/29/2021   Procedure: PERIPHERAL VASCULAR INTERVENTION;  Surgeon: Cherre Robins, MD;  Location: [place] [facility];  Service: Cardiovascular;;  Lt SFA Brachial Approach   ROTATOR CUFF REPAIR     right   ROTATOR CUFF REPAIR Right 2014   THROMBECTOMY FEMORAL ARTERY Left 01/31/2021   Procedure: THROMBECTOMY POPLITEAL  ARTERY;  Surgeon: Cherre Robins, MD;  Location: [facility];  Service: Vascular;  Laterality: Left;   TONSILLECTOMY      Family History  Problem Relation Age of Onset   Diabetes Mother    Heart attack Brother    Cancer Maternal Aunt     Social History:  reports that she quit smoking about 21 years ago. Her smoking use included cigarettes. She has never used smokeless tobacco. She reports that she does not drink alcohol and does not use drugs.  Allergies:  Allergies  Allergen Reactions   Oxycodone Shortness Of Breath    Medications: I have reviewed the patient's current medications.  Results for orders placed or performed during the hospital encounter of 07/05/21 (from the past 48 hour(s))  Resp Panel by RT-PCR (Flu A&B, Covid) Nasopharyngeal Swab     Status: None   Collection Time: 07/05/21 12:11 AM   Specimen: Nasopharyngeal Swab; Nasopharyngeal(NP) swabs in vial transport medium  Result Value Ref Range   SARS Coronavirus 2 by RT PCR NEGATIVE NEGATIVE    Comment: (NOTE) SARS-CoV-2 target nucleic acids are NOT DETECTED.  The SARS-CoV-2 RNA is generally detectable in upper respiratory specimens during the acute phase of infection. The lowest concentration of SARS-CoV-2 viral copies this assay can detect is 138 copies/mL. A negative result does not preclude SARS-Cov-2 infection and should not be used as the sole basis for treatment or other patient management decisions. A negative result may occur with  improper specimen collection/handling, submission of specimen other than nasopharyngeal swab, presence of viral mutation(s) within the areas targeted by this assay, and inadequate number of viral copies(<138 copies/mL). A negative result  must be combined with clinical observations, patient history, and epidemiological information. The expected result is Negative.  Fact Sheet for Patients:  EntrepreneurPulse.com.au  Fact Sheet for Healthcare Providers:  IncredibleEmployment.be  This test is no t yet approved or cleared by the [place] FDA and  has been authorized for detection and/or diagnosis of SARS-CoV-2 by FDA under an Emergency Use Authorization (EUA). This EUA will remain  in effect (meaning this test can be used) for the duration of the COVID-19 declaration under Section 564(b)(1) of the Act, 21 U.S.C.section 360bbb-3(b)(1), unless the authorization is terminated  or revoked sooner.       Influenza A by PCR NEGATIVE NEGATIVE   Influenza B by PCR NEGATIVE NEGATIVE    Comment: (NOTE) The Xpert Xpress SARS-CoV-2/FLU/RSV plus assay is intended as an aid in the diagnosis of influenza from Nasopharyngeal swab specimens and should not be used as a sole basis for treatment. Nasal washings and aspirates are unacceptable for Xpert Xpress SARS-CoV-2/FLU/RSV testing.  Fact Sheet for Patients: EntrepreneurPulse.com.au  Fact Sheet for Healthcare Providers: IncredibleEmployment.be  This test is not yet approved or cleared by the [place] FDA and has been authorized for detection and/or diagnosis  of SARS-CoV-2 by FDA under an Emergency Use Authorization (EUA). This EUA will remain in effect (meaning this test can be used) for the duration of the COVID-19 declaration under Section 564(b)(1) of the Act, 21 U.S.C. section 360bbb-3(b)(1), unless the authorization is terminated or revoked.  Performed at [facility], [place] [address] [place], [place] [address]   Hemoglobin and hematocrit, blood     Status: Abnormal   Collection Time: 07/06/21 10:36 AM  Result Value Ref Range   Hemoglobin 8.2 (L) 12.0 - 15.0 g/dL   HCT  26.3 (L) 36.0 - 46.0 %    Comment: Performed at [facility], [place] [address] [place], [place] [address]    No results found.  Review of Systems negative except above Blood pressure (!) 117/57, pulse 72, temperature 98.3 F (36.8 C), temperature source Oral, resp. rate 18, height 5\' 5"  (1.651 m), weight 82.1 kg, SpO2 100 %. Physical Exam vital signs stable afebrile no acute distress exam pertinent for her abdomen being soft nontender labs reviewed BUN and creatinine okay not currently iron deficient hemoglobin increased with transfusion  Assessment/Plan: Chronic GI blood loss Plan: Once the capsule endoscopy is completed this evening she could go home either tonight or tomorrow if doing well and no other medical issues and we will look at the capsule endoscopy either Sunday afternoon or Monday and call her as an outpatient to discuss the findings and decide how to proceed from a GI standpoint and she agrees with this plan and she was instructed if she did not hear from Dr. Kathline Magic nurse by Wednesday to give [place] a call  [facility] 07/06/2021, 1:49 PM

## 2021-07-06 NOTE — Progress Notes (Signed)
Pt given water, Sprite, and chicken broth. 2 hours post capsule.

## 2021-07-06 NOTE — Progress Notes (Signed)
VCE Results Note  Findings: First gastric image 00:01:29 First duodenal image 00:15:34 First cecal image 02:05:04  Blood visualized in the stomach. Duodenal angioectasia visualized at 00:15:44, 00:15:52, and 00:16:23.  Blood clot with possible active bleeding visualized at 00:21:13 and 00:21:23.  Summary and Recommendations: Recommend SBE for treatment of duodenal angioectasias and evaluation of bleeding source from the stomach and proximal small bowel. Spoke with Dr. Watt Climes and he will plan to do this procedure tomorrow. Made NPO after MN in preparation.   Blood in stomach at 00:13:36   Largest duodenal angioectasia at 00:15:52   Blood clot with possible active bleeding seen at 00:21:23

## 2021-07-06 NOTE — Progress Notes (Signed)
PROGRESS NOTE    Lacey Vega  YTK:[id number] DOB: [birth year] DOA: 07/05/2021 PCP: Rogers Blocker, MD   Brief Narrative: Lacey Vega is a 71 y.o. 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 Patient received a 2 unit PRBC transfusion and capsule endoscopy performed.   Assessment & Plan:   Assessment and Plan: * Acute on chronic blood loss anemia- (present on admission) History of gastric and duodenal AVMs. Also with iron deficiency secondary to chronic blood loss. Hemoglobin of 6.2 on admission. 2 units of PRBC transfused with post-transfusion hemoglobin of 8.2. GI consulted and have recommended a capsule endoscopy study which was started today. -Continue Protonix -Continue to hold aspirin and Plavix for now  Chronic kidney disease, stage 3a ([place])- (present on admission) Stable.  PAD (peripheral artery disease) ([place])- (present on admission) Patient with left femoral artery stenting in 03/2021. Patient started on DAPT with recommendations for only one month treatment of Plavix. Aspirin and Plavix held on admission.  Hypothyroidism (acquired)- (present on admission) Continue Synthroid.  Asthma with COPD ([place])- (present on admission) Stable. -Continue Dulera, Singulair, Albuterol     DVT prophylaxis: SCDs Code Status:   Code Status: Full Code Family Communication: None at bedside Disposition Plan: Discharge home likely in 24 hours   Consultants:  [facility]  Procedures:  Capsule endoscopy  Antimicrobials: None    Subjective: No concerns today. Feels better after blood  transfusion  Objective: Vitals:   07/06/21 0648 07/06/21 0903 07/06/21 0915 07/06/21 1322  BP: 122/61   (!) 117/57  Pulse: 75   72  Resp: 18   18  Temp: 98.2 F (36.8 C)   98.3 F (36.8 C)  TempSrc: Oral   Oral  SpO2: 100% 99%  100%  Weight:   82.1 kg   Height:   5\' 5"  (1.651 m)     Intake/Output Summary (Last 24 hours) at 07/06/2021 1512 Last data filed at 07/06/2021 1300 Gross per 24 hour  Intake 1767 ml  Output 0 ml  Net 1767 ml   Filed Weights   07/06/21 0915  Weight: 82.1 kg    Examination:  General exam: Appears calm and comfortable  Respiratory system: Clear to auscultation. Respiratory effort normal. Cardiovascular system: S1 & S2 heard, RRR. Gastrointestinal system: Abdomen is nondistended, soft and nontender. No organomegaly or masses felt. Normal bowel sounds heard. Central nervous system: Alert and oriented. No focal neurological deficits. Musculoskeletal: No edema. No calf tenderness Skin: No cyanosis. No rashes Psychiatry: Judgement and insight appear normal. Mood & affect appropriate.     Data Reviewed: I have personally reviewed following labs and imaging studies  CBC Lab Results  Component Value Date   WBC 5.6 07/06/2021   RBC 2.95 (L) 07/06/2021   HGB 8.1 (L) 07/06/2021   HCT 26.0 (L) 07/06/2021   MCV 88.1 07/06/2021   MCH 27.5 07/06/2021   PLT 237 07/06/2021   MCHC 31.2 07/06/2021   RDW 17.1 (H) 07/06/2021   LYMPHSABS 0.7 07/05/2021   MONOABS 0.5 07/05/2021   EOSABS 0.1 07/05/2021   BASOSABS 0.0 75/17/0017     Last metabolic panel Lab Results  Component Value Date   NA  139 07/06/2021   K 3.8 07/06/2021   CL 105 07/06/2021   CO2 27 07/06/2021   BUN 19 07/06/2021   CREATININE 0.87 07/06/2021   GLUCOSE 98 07/06/2021   GFRNONAA >60 07/06/2021   GFRAA >60 02/15/2020   CALCIUM 8.5 (L) 07/06/2021   PROT 6.4 (L) 07/05/2021   ALBUMIN 3.6 07/05/2021   BILITOT 0.5 07/05/2021   ALKPHOS 100 07/05/2021   AST 18 07/05/2021   ALT 10  07/05/2021   ANIONGAP 7 07/06/2021    CBG (last 3)  No results for input(s): GLUCAP in the last 72 hours.   GFR: Estimated Creatinine Clearance: 62.7 mL/min (by C-G formula based on SCr of 0.87 mg/dL).  Coagulation Profile: Recent Labs  Lab 07/06/21 1418  INR 1.1    Recent Results (from the past 240 hour(s))  Resp Panel by RT-PCR (Flu A&B, Covid) Nasopharyngeal Swab     Status: None   Collection Time: 07/05/21 12:11 AM   Specimen: Nasopharyngeal Swab; Nasopharyngeal(NP) swabs in vial transport medium  Result Value Ref Range Status   SARS Coronavirus 2 by RT PCR NEGATIVE NEGATIVE Final    Comment: (NOTE) SARS-CoV-2 target nucleic acids are NOT DETECTED.  The SARS-CoV-2 RNA is generally detectable in upper respiratory specimens during the acute phase of infection. The lowest concentration of SARS-CoV-2 viral copies this assay can detect is 138 copies/mL. A negative result does not preclude SARS-Cov-2 infection and should not be used as the sole basis for treatment or other patient management decisions. A negative result may occur with  improper specimen collection/handling, submission of specimen other than nasopharyngeal swab, presence of viral mutation(s) within the areas targeted by this assay, and inadequate number of viral copies(<138 copies/mL). A negative result must be combined with clinical observations, patient history, and epidemiological information. The expected result is Negative.  Fact Sheet for Patients:  EntrepreneurPulse.com.au  Fact Sheet for Healthcare Providers:  IncredibleEmployment.be  This test is no t yet approved or cleared by the [place] FDA and  has been authorized for detection and/or diagnosis of SARS-CoV-2 by FDA under an Emergency Use Authorization (EUA). This EUA will remain  in effect (meaning this test can be used) for the duration of the COVID-19 declaration under Section 564(b)(1) of the Act,  21 U.S.C.section 360bbb-3(b)(1), unless the authorization is terminated  or revoked sooner.       Influenza A by PCR NEGATIVE NEGATIVE Final   Influenza B by PCR NEGATIVE NEGATIVE Final    Comment: (NOTE) The Xpert Xpress SARS-CoV-2/FLU/RSV plus assay is intended as an aid in the diagnosis of influenza from Nasopharyngeal swab specimens and should not be used as a sole basis for treatment. Nasal washings and aspirates are unacceptable for Xpert Xpress SARS-CoV-2/FLU/RSV testing.  Fact Sheet for Patients: EntrepreneurPulse.com.au  Fact Sheet for Healthcare Providers: IncredibleEmployment.be  This test is not yet approved or cleared by the [place] FDA and has been authorized for detection and/or diagnosis of SARS-CoV-2 by FDA under an Emergency Use Authorization (EUA). This EUA will remain in effect (meaning this test can be used) for the duration of the COVID-19 declaration under Section 564(b)(1) of the Act, 21 U.S.C. section 360bbb-3(b)(1), unless the authorization is terminated or revoked.  Performed at [facility], [place] [address] [place], [place] [address]         Radiology Studies: No results found.      Scheduled Meds:  atorvastatin  40 mg Oral Daily   donepezil  5 mg Oral QHS  furosemide  20 mg Oral Daily   levothyroxine  125 mcg Oral QAC breakfast   mometasone-formoterol  2 puff Inhalation BID   montelukast  10 mg Oral Daily   pantoprazole (PROTONIX) IV  40 mg Intravenous Q12H   Continuous Infusions:   LOS: 1 day     Cordelia Poche, MD [facility] 07/06/2021, 3:12 PM  If 7PM-7AM, please contact night-coverage [email or web address]

## 2021-07-07 ENCOUNTER — Encounter (HOSPITAL_COMMUNITY): Payer: Self-pay | Admitting: Internal Medicine

## 2021-07-07 ENCOUNTER — Observation Stay (HOSPITAL_COMMUNITY): Payer: Medicare HMO | Admitting: Certified Registered Nurse Anesthetist

## 2021-07-07 ENCOUNTER — Encounter (HOSPITAL_COMMUNITY)
Admission: RE | Disposition: A | Discharge status: Home / Self Care | Payer: Self-pay | Source: Ambulatory Visit | Attending: Family Medicine

## 2021-07-07 DIAGNOSIS — Z7982 Long term (current) use of aspirin: Secondary | ICD-10-CM | POA: Diagnosis not present

## 2021-07-07 DIAGNOSIS — Z87891 Personal history of nicotine dependence: Secondary | ICD-10-CM | POA: Diagnosis not present

## 2021-07-07 DIAGNOSIS — Z79890 Hormone replacement therapy (postmenopausal): Secondary | ICD-10-CM | POA: Diagnosis not present

## 2021-07-07 DIAGNOSIS — Z8249 Family history of ischemic heart disease and other diseases of the circulatory system: Secondary | ICD-10-CM | POA: Diagnosis not present

## 2021-07-07 DIAGNOSIS — Z95828 Presence of other vascular implants and grafts: Secondary | ICD-10-CM | POA: Diagnosis not present

## 2021-07-07 DIAGNOSIS — E785 Hyperlipidemia, unspecified: Secondary | ICD-10-CM | POA: Diagnosis present

## 2021-07-07 DIAGNOSIS — Z20822 Contact with and (suspected) exposure to covid-19: Secondary | ICD-10-CM | POA: Diagnosis present

## 2021-07-07 DIAGNOSIS — D62 Acute posthemorrhagic anemia: Secondary | ICD-10-CM | POA: Diagnosis present

## 2021-07-07 DIAGNOSIS — Z833 Family history of diabetes mellitus: Secondary | ICD-10-CM | POA: Diagnosis not present

## 2021-07-07 DIAGNOSIS — Z79899 Other long term (current) drug therapy: Secondary | ICD-10-CM | POA: Diagnosis not present

## 2021-07-07 DIAGNOSIS — D573 Sickle-cell trait: Secondary | ICD-10-CM | POA: Diagnosis present

## 2021-07-07 DIAGNOSIS — J449 Chronic obstructive pulmonary disease, unspecified: Secondary | ICD-10-CM | POA: Diagnosis present

## 2021-07-07 DIAGNOSIS — E039 Hypothyroidism, unspecified: Secondary | ICD-10-CM | POA: Diagnosis present

## 2021-07-07 DIAGNOSIS — K31811 Angiodysplasia of stomach and duodenum with bleeding: Secondary | ICD-10-CM | POA: Diagnosis present

## 2021-07-07 DIAGNOSIS — Z7902 Long term (current) use of antithrombotics/antiplatelets: Secondary | ICD-10-CM | POA: Diagnosis not present

## 2021-07-07 DIAGNOSIS — E1122 Type 2 diabetes mellitus with diabetic chronic kidney disease: Secondary | ICD-10-CM | POA: Diagnosis present

## 2021-07-07 DIAGNOSIS — E1151 Type 2 diabetes mellitus with diabetic peripheral angiopathy without gangrene: Secondary | ICD-10-CM | POA: Diagnosis present

## 2021-07-07 DIAGNOSIS — N1831 Chronic kidney disease, stage 3a: Secondary | ICD-10-CM | POA: Diagnosis present

## 2021-07-07 DIAGNOSIS — K219 Gastro-esophageal reflux disease without esophagitis: Secondary | ICD-10-CM | POA: Diagnosis present

## 2021-07-07 HISTORY — PX: ENTEROSCOPY: SHX5533

## 2021-07-07 HISTORY — PX: HOT HEMOSTASIS: SHX5433

## 2021-07-07 LAB — CBC WITH DIFFERENTIAL/PLATELET
Abs Immature Granulocytes: 0.01 10*3/uL (ref 0.00–0.07)
Basophils Absolute: 0 10*3/uL (ref 0.0–0.1)
Basophils Relative: 0 %
Eosinophils Absolute: 0.2 10*3/uL (ref 0.0–0.5)
Eosinophils Relative: 3 %
HCT: 28.5 % — ABNORMAL LOW (ref 36.0–46.0)
Hemoglobin: 8.7 g/dL — ABNORMAL LOW (ref 12.0–15.0)
Immature Granulocytes: 0 %
Lymphocytes Relative: 14 %
Lymphs Abs: 1.1 10*3/uL (ref 0.7–4.0)
MCH: 27.3 pg (ref 26.0–34.0)
MCHC: 30.5 g/dL (ref 30.0–36.0)
MCV: 89.3 fL (ref 80.0–100.0)
Monocytes Absolute: 0.7 10*3/uL (ref 0.1–1.0)
Monocytes Relative: 9 %
Neutro Abs: 5.7 10*3/uL (ref 1.7–7.7)
Neutrophils Relative %: 74 %
Platelets: 254 10*3/uL (ref 150–400)
RBC: 3.19 MIL/uL — ABNORMAL LOW (ref 3.87–5.11)
RDW: 17.2 % — ABNORMAL HIGH (ref 11.5–15.5)
WBC: 7.8 10*3/uL (ref 4.0–10.5)
nRBC: 0 % (ref 0.0–0.2)

## 2021-07-07 LAB — TYPE AND SCREEN
ABO/RH(D): A POS
Antibody Screen: NEGATIVE
Donor AG Type: NEGATIVE
Donor AG Type: NEGATIVE
Unit division: 0
Unit division: 0

## 2021-07-07 LAB — BPAM RBC
Blood Product Expiration Date: 202302232359
Blood Product Expiration Date: 202302242359
ISSUE DATE / TIME: 202302032347
ISSUE DATE / TIME: 202302040333
Unit Type and Rh: 6200
Unit Type and Rh: 6200

## 2021-07-07 SURGERY — ENTEROSCOPY
Anesthesia: Monitor Anesthesia Care

## 2021-07-07 MED ORDER — PHENYLEPHRINE 40 MCG/ML (10ML) SYRINGE FOR IV PUSH (FOR BLOOD PRESSURE SUPPORT)
PREFILLED_SYRINGE | INTRAVENOUS | Status: DC | PRN
  Administered 2021-07-07 (×2): 80 ug via INTRAVENOUS

## 2021-07-07 MED ORDER — LIDOCAINE 2% (20 MG/ML) 5 ML SYRINGE
INTRAMUSCULAR | Status: DC | PRN
  Administered 2021-07-07: 80 mg via INTRAVENOUS

## 2021-07-07 MED ORDER — PROPOFOL 10 MG/ML IV BOLUS
INTRAVENOUS | Status: DC | PRN
  Administered 2021-07-07 (×2): 20 mg via INTRAVENOUS
  Administered 2021-07-07: 10 mg via INTRAVENOUS
  Administered 2021-07-07: 20 mg via INTRAVENOUS
  Administered 2021-07-07: 30 mg via INTRAVENOUS
  Administered 2021-07-07: 20 mg via INTRAVENOUS

## 2021-07-07 MED ORDER — LACTATED RINGERS IV SOLN: INTRAVENOUS | Status: DC

## 2021-07-07 MED ORDER — PROPOFOL 500 MG/50ML IV EMUL
INTRAVENOUS | Status: DC | PRN
  Administered 2021-07-07: 100 ug/kg/min via INTRAVENOUS

## 2021-07-07 NOTE — Transfer of Care (Signed)
Immediate Anesthesia Transfer of Care Note  Patient: Lacey Vega & [employer]  Procedure(s) Performed: ENTEROSCOPY HOT HEMOSTASIS (ARGON PLASMA COAGULATION/BICAP)  Patient Location: PACU  Anesthesia Type:MAC  Level of Consciousness: drowsy and patient cooperative  Airway & Oxygen Therapy: Patient Spontanous Breathing and Patient connected to face mask oxygen  Post-op Assessment: Report given to RN and Post -op Vital signs reviewed and stable  Post vital signs: Reviewed and stable  Last Vitals:  Vitals Value Taken Time  BP 107/56 07/07/21 1115  Temp    Pulse 66 07/07/21 1118  Resp 19 07/07/21 1118  SpO2 100 % 07/07/21 1118  Vitals shown include unvalidated device data.  Last Pain:  Vitals:   07/07/21 1026  TempSrc: Oral  PainSc: 0-No pain         Complications: No notable events documented.

## 2021-07-07 NOTE — Anesthesia Postprocedure Evaluation (Signed)
Anesthesia Post Note  Patient: Lacey Vega  Procedure(s) Performed: ENTEROSCOPY HOT HEMOSTASIS (ARGON PLASMA COAGULATION/BICAP)     Patient location during evaluation: PACU Anesthesia Type: MAC Level of consciousness: awake and alert Pain management: pain level controlled Vital Signs Assessment: post-procedure vital signs reviewed and stable Respiratory status: spontaneous breathing, nonlabored ventilation, respiratory function stable and patient connected to nasal cannula oxygen Cardiovascular status: stable and blood pressure returned to baseline Postop Assessment: no apparent nausea or vomiting Anesthetic complications: no   No notable events documented.  Last Vitals:  Vitals:   07/07/21 1522 07/07/21 1543  BP: (!) 99/45 120/61  Pulse: 68 62  Resp: 18   Temp: 36.8 C   SpO2: 100%     Last Pain:  Vitals:   07/07/21 1522  TempSrc: Oral  PainSc:                  Lacey Vega

## 2021-07-07 NOTE — Progress Notes (Signed)
Pt has returned from Endo. Walked to bed from stretcher. Alert and oriented. In bed and sleeping.  VS WNL.

## 2021-07-07 NOTE — Anesthesia Preprocedure Evaluation (Addendum)
Anesthesia Evaluation  Patient identified by MRN, date of birth, ID band Patient awake    Reviewed: Allergy & Precautions, NPO status , Patient's Chart, lab work & pertinent test results  History of Anesthesia Complications Negative for: history of anesthetic complications  Airway Mallampati: II  TM Distance: >3 FB Neck ROM: Full    Dental  (+) Edentulous Upper, Edentulous Lower   Pulmonary asthma , COPD, former smoker,    breath sounds clear to auscultation       Cardiovascular hypertension, + Peripheral Vascular Disease and + DOE   Rhythm:Regular     Neuro/Psych  Headaches, PSYCHIATRIC DISORDERS Anxiety TIA   GI/Hepatic Neg liver ROS, hiatal hernia, GERD  ,  Endo/Other  diabetesHypothyroidism Lab Results      Component                Value               Date                      HGBA1C                   4.7 (L)             04/24/2021             Renal/GU CRFRenal diseaseLab Results      Component                Value               Date                      CREATININE               0.87                07/06/2021           Lab Results      Component                Value               Date                      K                        3.8                 07/06/2021                Musculoskeletal  (+) Arthritis ,   Abdominal   Peds  Hematology  (+) Blood dyscrasia, anemia , Lab Results      Component                Value               Date                      WBC                      5.6                 07/06/2021                HGB  8.1 (L)             07/06/2021                HCT                      26.0 (L)            07/06/2021                MCV                      88.1                07/06/2021                PLT                      237                 07/06/2021              Anesthesia Other Findings HPI: Lacey Vega is a 71 y.o. female with medical history significant  for iron deficiency anemia due to chronic blood loss secondary to duodenal and gastric AVMs and sickle cell trait, PAD, AAA s/p EVAR complicated by left ischemic limb requiring thrombectomy and left femoral artery stenting, asthma, hypothyroidism, CKD stage IIIa, hyperlipidemia who was directly admitted for evaluation of acute on chronic anemia.  Patient presented to hematology/oncology clinic 07/05/2021 for follow-up with Dr. Lorenso Courier for management of iron deficiency anemia due to chronic blood loss.  Labs are notable for hemoglobin 6.2 and patient reported dark tarry stool.  Type and screen was performed.  Dr. Lorenso Courier discussed with [facility], Dr. Watt Climes, who recommended hospital admission and GI will consult in hospital.  On arrival to floor, order for 2 unit PRBC transfusion was placed by Dr. Lorenso Courier.  Patient reports chronic exertional dyspnea and palpitations with mild activity.  She also reports chronic dark stools, appear more tarry recently.  She does take iron supplements as well as aspirin 81 mg and Plavix 75 mg every morning.  She has not seen any recent red blood in her stool.  She has not had any nausea, vomiting, abdominal pain, dysuria.  She reports chronic swelling in her lower extremities for which she takes Lasix and wears compression stockings.   Reproductive/Obstetrics                            Anesthesia Physical Anesthesia Plan  ASA: 3  Anesthesia Plan: MAC   Post-op Pain Management: Minimal or no pain anticipated   Induction:   PONV Risk Score and Plan: 2 and Propofol infusion and Treatment may vary due to age or medical condition  Airway Management Planned: Nasal Cannula  Additional Equipment: None  Intra-op Plan:   Post-operative Plan:   Informed Consent: I have reviewed the patients History and Physical, chart, labs and discussed the procedure including the risks, benefits and alternatives for the proposed anesthesia with the patient or  authorized representative who has indicated his/her understanding and acceptance.     Dental advisory given  Plan Discussed with: CRNA and Anesthesiologist  Anesthesia Plan Comments:         Anesthesia Quick Evaluation

## 2021-07-07 NOTE — Op Note (Signed)
[facility] Patient Name: Lacey Vega Procedure Date: 07/07/2021 MRN: [id number] Attending MD: Clarene Essex , MD Date of Birth: [birth year] CSN: [id number] Age: 71 Admit Type: Inpatient Procedure:                Small bowel enteroscopy Indications:              Iron deficiency anemia secondary to chronic blood                            loss, Arteriovenous malformation in the stomach,                            Arteriovenous malformation in the small intestine Providers:                Clarene Essex, MD, Jaci Carrel, RN, Tyna Jaksch                            Technician Referring MD:              Medicines:                Propofol total dose 360 mg IV, 80 mg IV lidocaine Complications:            No immediate complications. Estimated Blood Loss:     Estimated blood loss: none. Procedure:                Pre-Anesthesia Assessment:                           - Prior to the procedure, a History and Physical                            was performed, and patient medications and                            allergies were reviewed. The patient's tolerance of                            previous anesthesia was also reviewed. The risks                            and benefits of the procedure and the sedation                            options and risks were discussed with the patient.                            All questions were answered, and informed consent                            was obtained. Prior Anticoagulants: The patient has                            taken Plavix (clopidogrel), last dose was 2 days  prior to procedure. ASA Grade Assessment: III - A                            patient with severe systemic disease. After                            reviewing the risks and benefits, the patient was                            deemed in satisfactory condition to undergo the                            procedure.                           After  obtaining informed consent, the endoscope was                            passed under direct vision. Throughout the                            procedure, the patient's blood pressure, pulse, and                            oxygen saturations were monitored continuously. The                            [id number]-H190TL ([id number] Olympus slim colonoscope was                            introduced through the mouth and advanced to the                            distal jejunum. The small bowel enteroscopy was                            accomplished without difficulty. The patient                            tolerated the procedure well. Scope In: Scope Out: Findings:      The examined esophagus was normal.      A single diminutive angiodysplastic lesion with no bleeding was found in       the gastric antrum. Fulguration to ablate the lesion to prevent bleeding       by argon plasma at .5 liter/minute and 20 watts was successful.      Three angiodysplastic lesions with no bleeding were found in the first       portion of the duodenum and in the fourth portion of the duodenum.       Fulguration to ablate the lesion to prevent bleeding by argon plasma at       0.5 liters/minute and 20 watts was successful.      There was no evidence of significant pathology in the entire examined       portion of jejunum. Impression:               -  Normal esophagus.                           - A single non-bleeding angiodysplastic lesion in                            the stomach. Treated with argon plasma coagulation                            (APC).                           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 Treated with argon plasma coagulation                            (APC).                           - The examined portion of the jejunum was normal.                           - No specimens collected. Moderate Sedation:      Not Applicable - Patient had care  per Anesthesia. Recommendation:           - Soft diet today. Hopefully can go home soon                            repeat labs today                           - Continue present medications. Reevaluate need for                            both aspirin and Plavix                           - Return to GI clinic PRN. Or in 1 month with your                            primary gastroenterologist Dr. Michail Sermon                           - Telephone GI clinic if symptomatic PRN. Procedure Code(s):        --- Professional ---                           [phone], Small intestinal endoscopy, enteroscopy                            beyond second portion of duodenum, not including                            ileum; with control of bleeding (eg, injection,  bipolar cautery, unipolar cautery, laser, heater                            probe, stapler, plasma coagulator) Diagnosis Code(s):        --- Professional ---                           K31.819, Angiodysplasia of stomach and duodenum                            without bleeding                           D50.0, Iron deficiency anemia secondary to blood                            loss (chronic) CPT copyright 2019 American Medical Association. All rights reserved. The codes documented in this report are preliminary and upon coder review may  be revised to meet current compliance requirements. Clarene Essex, MD 07/07/2021 11:22:28 AM This report has been signed electronically. Number of Addenda: 0

## 2021-07-07 NOTE — Progress Notes (Signed)
Lacey Vega 9:37 AM  Subjective: Patient doing well this morning without any new complaints and thanks to Dr. Lorenso Courier for reading the capsule endoscopy and I discussed the results with the patient and will proceed with endoscopy today  Objective: Vital signs stable afebrile no acute distress exam please see preassessment evaluation no new labs today  Assessment: History of AVM blood loss and patient on aspirin and Plavix with positive capsule endoscopy  Plan: Okay to proceed with enteroscopy with possible APC with anesthesia assistance today with further work-up and plans pending those findings  [facility]  office [phone] After 5PM or if no answer call [phone]

## 2021-07-07 NOTE — Progress Notes (Signed)
PROGRESS NOTE    Lacey Vega  JJK:[id number] DOB: Dec 25, 1950 DOA: 07/05/2021 PCP: Rogers Blocker, MD   Brief Narrative: Lacey Vega is a 71 y.o. 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 Patient received a 2 unit PRBC transfusion and capsule endoscopy performed which was significant for duodenal AVM. Small bowel enteroscopy was significant for gastric/duodenal AVMs, treated with APC.   Assessment and Plan: * Acute on chronic blood loss anemia- (present on admission) History of gastric and duodenal AVMs. Also with iron deficiency secondary to chronic blood loss. Hemoglobin of 6.2 on admission. 2 units of PRBC transfused with post-transfusion hemoglobin of 8.2. GI consulted and have recommended a capsule endoscopy study which was significant for evidence of duodenal angioectasia. Small bowel enteroscopy performed on 2/5 and was significant for a non-bleeding AVM in the stomach and three non-bleeding AVMs, all treated with APC. -Continue Protonix -Continue to hold aspirin and Plavix for now; restart pending GI recommendations  Chronic kidney disease, stage 3a ([place])- (present on admission) Stable.  PAD (peripheral artery disease) ([place])- (present on admission) Patient with left femoral artery stenting in 03/2021. Patient started on DAPT with recommendations for only one month treatment of Plavix. Aspirin and Plavix held on admission.  Hypothyroidism (acquired)- (present on admission) -Continue Synthroid.  Asthma with COPD ([place])- (present on admission) Stable. -Continue Dulera, Singulair, Albuterol     DVT  prophylaxis: SCDs Code Status:   Code Status: Full Code Family Communication: None at bedside Disposition Plan: Discharge home likely in 24 hours pending final GI recommendations   Consultants:  [facility]  Procedures:  Capsule endoscopy  Antimicrobials: None    Subjective: No issues this morning. Waiting for endoscopy. No bowel movement overnight.  Objective: Vitals:   07/07/21 1140 07/07/21 1201 07/07/21 1522 07/07/21 1543  BP: 128/71 128/63 (!) 99/45 120/61  Pulse: 70 66 68 62  Resp: 16 16 18    Temp:  97.6 F (36.4 C) 98.2 F (36.8 C)   TempSrc:  Oral Oral   SpO2: 100% 100% 100%   Weight:      Height:        Intake/Output Summary (Last 24 hours) at 07/07/2021 1608 Last data filed at 07/07/2021 1500 Gross per 24 hour  Intake 640 ml  Output --  Net 640 ml    Filed Weights   07/06/21 0915 07/07/21 1026  Weight: 82.1 kg 82.1 kg    Examination:  General exam: Appears calm and comfortable Respiratory system: Clear to auscultation. Respiratory effort normal. Cardiovascular system: S1 & S2 heard, RRR. No murmurs, rubs, gallops or clicks. Gastrointestinal system: Abdomen is nondistended, soft and nontender. No organomegaly or masses felt. Normal bowel sounds heard. Central nervous system: Alert and oriented. No focal neurological deficits. Musculoskeletal: No edema. No calf tenderness Skin: No cyanosis. No rashes Psychiatry: Judgement and insight appear normal. Mood & affect appropriate.     Data Reviewed: I have personally reviewed following labs and imaging studies  CBC Lab Results  Component Value Date   WBC 7.8 07/07/2021   RBC 3.19 (L) 07/07/2021   HGB 8.7 (L) 07/07/2021   HCT 28.5 (L) 07/07/2021   MCV 89.3 07/07/2021  MCH 27.3 07/07/2021   PLT 254 07/07/2021   MCHC 30.5 07/07/2021   RDW 17.2 (H) 07/07/2021   LYMPHSABS 1.1 07/07/2021   MONOABS 0.7 07/07/2021   EOSABS 0.2 07/07/2021   BASOSABS 0.0 31/51/7616     Last metabolic panel Lab  Results  Component Value Date   NA 139 07/06/2021   K 3.8 07/06/2021   CL 105 07/06/2021   CO2 27 07/06/2021   BUN 19 07/06/2021   CREATININE 0.87 07/06/2021   GLUCOSE 98 07/06/2021   GFRNONAA >60 07/06/2021   GFRAA >60 02/15/2020   CALCIUM 8.5 (L) 07/06/2021   PROT 6.4 (L) 07/05/2021   ALBUMIN 3.6 07/05/2021   BILITOT 0.5 07/05/2021   ALKPHOS 100 07/05/2021   AST 18 07/05/2021   ALT 10 07/05/2021   ANIONGAP 7 07/06/2021    CBG (last 3)  No results for input(s): GLUCAP in the last 72 hours.   GFR: Estimated Creatinine Clearance: 62.7 mL/min (by C-G formula based on SCr of 0.87 mg/dL).  Coagulation Profile: Recent Labs  Lab 07/06/21 1418  INR 1.1     Recent Results (from the past 240 hour(s))  Resp Panel by RT-PCR (Flu A&B, Covid) Nasopharyngeal Swab     Status: None   Collection Time: 07/05/21 12:11 AM   Specimen: Nasopharyngeal Swab; Nasopharyngeal(NP) swabs in vial transport medium  Result Value Ref Range Status   SARS Coronavirus 2 by RT PCR NEGATIVE NEGATIVE Final    Comment: (NOTE) SARS-CoV-2 target nucleic acids are NOT DETECTED.  The SARS-CoV-2 RNA is generally detectable in upper respiratory specimens during the acute phase of infection. The lowest concentration of SARS-CoV-2 viral copies this assay can detect is 138 copies/mL. A negative result does not preclude SARS-Cov-2 infection and should not be used as the sole basis for treatment or other patient management decisions. A negative result may occur with  improper specimen collection/handling, submission of specimen other than nasopharyngeal swab, presence of viral mutation(s) within the areas targeted by this assay, and inadequate number of viral copies(<138 copies/mL). A negative result must be combined with clinical observations, patient history, and epidemiological information. The expected result is Negative.  Fact Sheet for Patients:  EntrepreneurPulse.com.au  Fact Sheet  for Healthcare Providers:  IncredibleEmployment.be  This test is no t yet approved or cleared by the [place] FDA and  has been authorized for detection and/or diagnosis of SARS-CoV-2 by FDA under an Emergency Use Authorization (EUA). This EUA will remain  in effect (meaning this test can be used) for the duration of the COVID-19 declaration under Section 564(b)(1) of the Act, 21 U.S.C.section 360bbb-3(b)(1), unless the authorization is terminated  or revoked sooner.       Influenza A by PCR NEGATIVE NEGATIVE Final   Influenza B by PCR NEGATIVE NEGATIVE Final    Comment: (NOTE) The Xpert Xpress SARS-CoV-2/FLU/RSV plus assay is intended as an aid in the diagnosis of influenza from Nasopharyngeal swab specimens and should not be used as a sole basis for treatment. Nasal washings and aspirates are unacceptable for Xpert Xpress SARS-CoV-2/FLU/RSV testing.  Fact Sheet for Patients: EntrepreneurPulse.com.au  Fact Sheet for Healthcare Providers: IncredibleEmployment.be  This test is not yet approved or cleared by the [place] FDA and has been authorized for detection and/or diagnosis of SARS-CoV-2 by FDA under an Emergency Use Authorization (EUA). This EUA will remain in effect (meaning this test can be used) for the duration of the COVID-19 declaration under Section 564(b)(1) of the Act, 21 U.S.C. section 360bbb-3(b)(1),  unless the authorization is terminated or revoked.  Performed at [facility], [place] [address] [place], [place] [address]          Radiology Studies: No results found.      Scheduled Meds:  atorvastatin  40 mg Oral Daily   donepezil  5 mg Oral QHS   furosemide  20 mg Oral Daily   levothyroxine  125 mcg Oral QAC breakfast   mometasone-formoterol  2 puff Inhalation BID   montelukast  10 mg Oral Daily   pantoprazole (PROTONIX) IV  40 mg Intravenous Q12H   Continuous  Infusions:   LOS: 1 day     Cordelia Poche, MD [facility] 07/07/2021, 4:08 PM  If 7PM-7AM, please contact night-coverage [email or web address]

## 2021-07-08 ENCOUNTER — Encounter (HOSPITAL_COMMUNITY): Payer: Self-pay | Admitting: Gastroenterology

## 2021-07-08 LAB — CBC
HCT: 26.5 % — ABNORMAL LOW (ref 36.0–46.0)
Hemoglobin: 8.2 g/dL — ABNORMAL LOW (ref 12.0–15.0)
MCH: 27.3 pg (ref 26.0–34.0)
MCHC: 30.9 g/dL (ref 30.0–36.0)
MCV: 88.3 fL (ref 80.0–100.0)
Platelets: 241 10*3/uL (ref 150–400)
RBC: 3 MIL/uL — ABNORMAL LOW (ref 3.87–5.11)
RDW: 16.7 % — ABNORMAL HIGH (ref 11.5–15.5)
WBC: 6.4 10*3/uL (ref 4.0–10.5)
nRBC: 0 % (ref 0.0–0.2)

## 2021-07-08 NOTE — Progress Notes (Signed)
PROGRESS NOTE    Lacey Vega  EXN:[id number] DOB: 1951/05/19 DOA: 07/05/2021 PCP: Rogers Blocker, MD   Brief Narrative: Lacey Vega is a 71 y.o. 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 Patient received a 2 unit PRBC transfusion and capsule endoscopy performed which was significant for duodenal AVM. Small bowel enteroscopy was significant for gastric/duodenal AVMs, treated with APC.   Assessment and Plan: * Acute on chronic blood loss anemia- (present on admission) History of gastric and duodenal AVMs. Also with iron deficiency secondary to chronic blood loss. Hemoglobin of 6.2 on admission. 2 units of PRBC transfused with post-transfusion hemoglobin of 8.2. GI consulted and have recommended a capsule endoscopy study which was significant for evidence of duodenal angioectasia. Small bowel enteroscopy performed on 2/5 and was significant for a non-bleeding AVM in the stomach and three non-bleeding AVMs, all treated with APC. Episode of melena overnight -Continue Protonix -Continue to hold aspirin and Plavix for now; restart pending GI recommendations -Check CBC today  Chronic kidney disease, stage 3a ([place])- (present on admission) Stable.  PAD (peripheral artery disease) ([place])- (present on admission) Patient with left femoral artery stenting in 03/2021. Patient started on DAPT with recommendations for only one month treatment of Plavix. Aspirin and Plavix held on admission.  Hypothyroidism (acquired)- (present on admission) -Continue Synthroid  Asthma with COPD ([place])- (present on admission) Stable. -Continue  Dulera, Singulair, Albuterol    DVT prophylaxis: SCDs Code Status:   Code Status: Full Code Family Communication: Daughter at bedside Disposition Plan: Discharge home likely in 24 hours pending final GI recommendations and stable hemoglobin   Consultants:  [facility]  Procedures:  Capsule endoscopy  Antimicrobials: None    Subjective: No issues this morning. Patient reports frankly melanotic stool overnight.  Objective: Vitals:   07/07/21 1543 07/07/21 1944 07/08/21 0348 07/08/21 0837  BP: 120/61 (!) 109/51 (!) 103/48   Pulse: 62 70 69   Resp:  18 18   Temp:  98.2 F (36.8 C) 98.7 F (37.1 C)   TempSrc:  Oral Oral   SpO2:  100% 100% 99%  Weight:      Height:        Intake/Output Summary (Last 24 hours) at 07/08/2021 1031 Last data filed at 07/07/2021 1856 Gross per 24 hour  Intake 550 ml  Output --  Net 550 ml    Filed Weights   07/06/21 0915 07/07/21 1026  Weight: 82.1 kg 82.1 kg    Examination:  General exam: Appears calm and comfortable Respiratory system: Clear to auscultation. Respiratory effort normal. Cardiovascular system: S1 & S2 heard, RRR. No murmurs, rubs, gallops or clicks. Gastrointestinal system: Abdomen is nondistended, soft and nontender. No organomegaly or masses felt. Normal bowel sounds heard. Central nervous system: Alert and oriented. No focal neurological deficits. Musculoskeletal: No edema. No calf tenderness Skin: No cyanosis. No rashes Psychiatry: Judgement and insight appear normal. Mood & affect appropriate.     Data Reviewed: I have personally reviewed following labs and imaging studies  CBC Lab Results  Component Value Date   WBC 7.8 07/07/2021   RBC 3.19 (L) 07/07/2021   HGB 8.7 (L) 07/07/2021   HCT  28.5 (L) 07/07/2021   MCV 89.3 07/07/2021   MCH 27.3 07/07/2021   PLT 254 07/07/2021   MCHC 30.5 07/07/2021   RDW 17.2 (H) 07/07/2021   LYMPHSABS 1.1 07/07/2021   MONOABS 0.7 07/07/2021   EOSABS 0.2 07/07/2021    BASOSABS 0.0 11/26/9483     Last metabolic panel Lab Results  Component Value Date   NA 139 07/06/2021   K 3.8 07/06/2021   CL 105 07/06/2021   CO2 27 07/06/2021   BUN 19 07/06/2021   CREATININE 0.87 07/06/2021   GLUCOSE 98 07/06/2021   GFRNONAA >60 07/06/2021   GFRAA >60 02/15/2020   CALCIUM 8.5 (L) 07/06/2021   PROT 6.4 (L) 07/05/2021   ALBUMIN 3.6 07/05/2021   BILITOT 0.5 07/05/2021   ALKPHOS 100 07/05/2021   AST 18 07/05/2021   ALT 10 07/05/2021   ANIONGAP 7 07/06/2021    CBG (last 3)  No results for input(s): GLUCAP in the last 72 hours.   GFR: Estimated Creatinine Clearance: 62.7 mL/min (by C-G formula based on SCr of 0.87 mg/dL).  Coagulation Profile: Recent Labs  Lab 07/06/21 1418  INR 1.1     Recent Results (from the past 240 hour(s))  Resp Panel by RT-PCR (Flu A&B, Covid) Nasopharyngeal Swab     Status: None   Collection Time: 07/05/21 12:11 AM   Specimen: Nasopharyngeal Swab; Nasopharyngeal(NP) swabs in vial transport medium  Result Value Ref Range Status   SARS Coronavirus 2 by RT PCR NEGATIVE NEGATIVE Final    Comment: (NOTE) SARS-CoV-2 target nucleic acids are NOT DETECTED.  The SARS-CoV-2 RNA is generally detectable in upper respiratory specimens during the acute phase of infection. The lowest concentration of SARS-CoV-2 viral copies this assay can detect is 138 copies/mL. A negative result does not preclude SARS-Cov-2 infection and should not be used as the sole basis for treatment or other patient management decisions. A negative result may occur with  improper specimen collection/handling, submission of specimen other than nasopharyngeal swab, presence of viral mutation(s) within the areas targeted by this assay, and inadequate number of viral copies(<138 copies/mL). A negative result must be combined with clinical observations, patient history, and epidemiological information. The expected result is Negative.  Fact Sheet for Patients:   EntrepreneurPulse.com.au  Fact Sheet for Healthcare Providers:  IncredibleEmployment.be  This test is no t yet approved or cleared by the [place] FDA and  has been authorized for detection and/or diagnosis of SARS-CoV-2 by FDA under an Emergency Use Authorization (EUA). This EUA will remain  in effect (meaning this test can be used) for the duration of the COVID-19 declaration under Section 564(b)(1) of the Act, 21 U.S.C.section 360bbb-3(b)(1), unless the authorization is terminated  or revoked sooner.       Influenza A by PCR NEGATIVE NEGATIVE Final   Influenza B by PCR NEGATIVE NEGATIVE Final    Comment: (NOTE) The Xpert Xpress SARS-CoV-2/FLU/RSV plus assay is intended as an aid in the diagnosis of influenza from Nasopharyngeal swab specimens and should not be used as a sole basis for treatment. Nasal washings and aspirates are unacceptable for Xpert Xpress SARS-CoV-2/FLU/RSV testing.  Fact Sheet for Patients: EntrepreneurPulse.com.au  Fact Sheet for Healthcare Providers: IncredibleEmployment.be  This test is not yet approved or cleared by the [place] FDA and has been authorized for detection and/or diagnosis of SARS-CoV-2 by FDA under an Emergency Use Authorization (EUA). This EUA will remain in effect (meaning this test can be used) for the duration of the COVID-19 declaration  under Section 564(b)(1) of the Act, 21 U.S.C. section 360bbb-3(b)(1), unless the authorization is terminated or revoked.  Performed at [facility], [place] [address], [place], [place] [address]          Radiology Studies: No results found.      Scheduled Meds:  atorvastatin  40 mg Oral Daily   donepezil  5 mg Oral QHS   furosemide  20 mg Oral Daily   levothyroxine  125 mcg Oral QAC breakfast   mometasone-formoterol  2 puff Inhalation BID   montelukast  10 mg Oral Daily    pantoprazole (PROTONIX) IV  40 mg Intravenous Q12H   Continuous Infusions:   LOS: 1 day     Cordelia Poche, MD [facility] 07/08/2021, 10:31 AM  If 7PM-7AM, please contact night-coverage [email or web address]

## 2021-07-08 NOTE — Progress Notes (Addendum)
[facility] Progress Note  Lacey Vega 71 y.o. 07-02-1950  CC: Melena, anemia   Subjective: Patient states she had a dark tarry stool last night.  She states her stools are always dark due to iron supplementation, though not always tarry.  Had enteroscopy yesterday.  Had dinner last night and tolerated it well.  Denies abdominal pain.  Denies nausea/vomiting.  ROS : Review of Systems  Gastrointestinal:  Positive for melena. Negative for abdominal pain, blood in stool, constipation, diarrhea, heartburn, nausea and vomiting.  Genitourinary:  Negative for dysuria and urgency.     Objective: Vital signs in last 24 hours: Vitals:   07/08/21 0348 07/08/21 0837  BP: (!) 103/48   Pulse: 69   Resp: 18   Temp: 98.7 F (37.1 C)   SpO2: 100% 99%    Physical Exam:  General:  Alert, cooperative, no distress, appears stated age  Head:  Normocephalic, without obvious abnormality, atraumatic  Eyes:  Anicteric sclera, EOM's intact, conjunctival pallor  Lungs:   Clear to auscultation bilaterally, respirations unlabored  Heart:  Regular rate and rhythm, S1, S2 normal  Abdomen:   Soft, non-tender, bowel sounds active all four quadrants,  no masses,     Lab Results: Recent Labs    07/05/21 1355 07/06/21 1418  NA 141 139  K 3.9 3.8  CL 105 105  CO2 30 27  GLUCOSE 121* 98  BUN 22 19  CREATININE 1.07* 0.87  CALCIUM 8.9 8.5*   Recent Labs    07/05/21 1355  AST 18  ALT 10  ALKPHOS 100  BILITOT 0.5  PROT 6.4*  ALBUMIN 3.6   Recent Labs    07/05/21 1355 07/06/21 1036 07/06/21 1418 07/07/21 1231  WBC 7.0  --  5.6 7.8  NEUTROABS 5.6  --   --  5.7  HGB 6.2*   < > 8.1* 8.7*  HCT 20.9*   < > 26.0* 28.5*  MCV 88.9  --  88.1 89.3  PLT 286  --  237 254   < > = values in this interval not displayed.   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  No specimens collected  CKD  PAD  Asthma with COPD  Plan: Small bowel enteroscopy yesterday, 4 angiodysplastic lesions treated with APC.  Hemoglobin is improving, though patient had dark bowel movement last night.  Possible residual bleeding. Continue to monitor bowel movements and hemoglobin. Continue daily CBC and transfuse as needed to maintain HGB > 7  Continue supportive care Continue diet as tolerated Continue to hold plavix and ASA at this point. [facility] will follow  Garnette Scheuermann PA-C 07/08/2021, 9:22 AM  Contact #  [phone]

## 2021-07-08 NOTE — Clinical Social Work Note (Signed)
°  Transition of Care [facility]) Screening Note   Patient Details  Name: Julane Crock Date of Birth: 1950-09-16   Transition of Care [facility]) CM/SW Contact:    Trish Mage, LCSW Phone Number: 07/08/2021, 10:37 AM    Transition of Care Department [facility]) has reviewed patient and no TOC needs have been identified at this time. We will continue to monitor patient advancement through interdisciplinary progression rounds. If new patient transition needs arise, please place a TOC consult.

## 2021-07-09 LAB — CBC
HCT: 27.4 % — ABNORMAL LOW (ref 36.0–46.0)
Hemoglobin: 8.3 g/dL — ABNORMAL LOW (ref 12.0–15.0)
MCH: 27 pg (ref 26.0–34.0)
MCHC: 30.3 g/dL (ref 30.0–36.0)
MCV: 89.3 fL (ref 80.0–100.0)
Platelets: 243 10*3/uL (ref 150–400)
RBC: 3.07 MIL/uL — ABNORMAL LOW (ref 3.87–5.11)
RDW: 16.4 % — ABNORMAL HIGH (ref 11.5–15.5)
WBC: 6.6 10*3/uL (ref 4.0–10.5)
nRBC: 0 % (ref 0.0–0.2)

## 2021-07-09 MED ORDER — ASPIRIN EC 81 MG PO TBEC
81.0000 mg | DELAYED_RELEASE_TABLET | Freq: Every day | ORAL | Status: DC
  Administered 2021-07-09: 81 mg via ORAL
  Filled 2021-07-09 (×2): qty 1

## 2021-07-09 MED ORDER — CLOPIDOGREL BISULFATE 75 MG PO TABS
75.0000 mg | ORAL_TABLET | Freq: Every day | ORAL | Status: DC
  Administered 2021-07-09: 75 mg via ORAL
  Filled 2021-07-09: qty 1

## 2021-07-09 NOTE — Discharge Summary (Signed)
Physician Discharge Summary   Patient: Lacey Vega MRN: [id number] DOB: 1950/10/03  Admit date:     07/05/2021  Discharge date: 07/09/21  Discharge Physician: Cordelia Poche, MD   PCP: Rogers Blocker, MD   Recommendations at discharge:   Outpatient follow-up with PCP and GI  Discharge Diagnoses: Principal Problem:   Acute on chronic blood loss anemia Active Problems:   Asthma with COPD ([place])   Hypothyroidism (acquired)   PAD (peripheral artery disease) (HCC)   Chronic kidney disease, stage 3a ([place])  Resolved Problems:   * No resolved hospital problems. [facility] Course: Lacey Vega is a 71 y.o. 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 Patient received a 2 unit PRBC transfusion and capsule endoscopy performed which was significant for duodenal AVM. Small bowel enteroscopy was significant for gastric/duodenal AVMs, treated with APC. Hemoglobin stable prior to discharge.  Assessment and Plan: * Acute on chronic blood loss anemia- (present on admission) History of gastric and duodenal AVMs. Also with iron deficiency secondary to chronic blood loss. Hemoglobin of 6.2 on admission. 2 units of PRBC transfused with post-transfusion hemoglobin of 8.2. GI consulted and recommended a capsule endoscopy study which was significant for evidence of duodenal angioectasia. Small bowel enteroscopy performed on 2/5 and was significant for a non-bleeding AVM in the stomach and three non-bleeding AVMs, all treated with APC. Hemoglobin has remained stable and is 8.3 on day of discharge. Aspirin and Plavix restarted prior to  discharge. Patient to follow-up with GI as an outpatient.  Chronic kidney disease, stage 3a ([place])- (present on admission) Stable.  PAD (peripheral artery disease) ([place])- (present on admission) Patient with left femoral artery stenting in 03/2021. Patient started on DAPT with recommendations for only one month treatment of Plavix. Aspirin and Plavix held on admission. Restarted day of discharge after cleared by GI service.  Hypothyroidism (acquired)- (present on admission) Continue Synthroid.  Asthma with COPD ([place])- (present on admission) Stable. Continue Dulera, Singulair, Albuterol.           Consultants: [facility] Procedures performed: Small bowel enteroscopy  Disposition: Home Diet recommendation:  Cardiac diet  DISCHARGE MEDICATION: Allergies as of 07/09/2021       Reactions   Oxycodone Shortness Of Breath        Medication List     STOP taking these medications    omeprazole 40 MG capsule Commonly known as: PRILOSEC       TAKE these medications    acetaminophen 500 MG tablet Commonly known as: TYLENOL Take 500-1,000 mg by mouth every 6 (six) hours as needed for mild pain or headache.   albuterol 108 (90 Base) MCG/ACT inhaler Commonly known as: VENTOLIN HFA Inhale 1-2 puffs into the lungs every 6 (six) hours as needed for wheezing or shortness of breath.   aspirin EC 81 MG tablet Take 1 tablet (81 mg total) by mouth daily. **Start on 11/25** What changed: additional instructions   atorvastatin 40 MG tablet Commonly known as: LIPITOR Take 40 mg by mouth at bedtime.   carisoprodol 250 MG tablet Commonly known as: SOMA Take 250 mg by mouth 2 (two) times daily as needed (cramps).  CINNAMON PO Take 2,000 mg by mouth every evening.   clopidogrel 75 MG tablet Commonly known as: Plavix Take 1 tablet (75 mg total) by mouth daily. **Start on 11/25** What changed: additional instructions   donepezil 5 MG tablet Commonly known as:  ARICEPT Take 5 mg by mouth at bedtime.   ferrous sulfate 325 (65 FE) MG tablet Commonly known as: [employer] Iron TAKE 1 TABLET EVERY DAY . PLEASE TAKE 1 PILL DAILY WITH ORANGE JUICE OR 500 UNITS OF VITAMIN C What changed:  how much to take how to take this when to take this additional instructions   Fish Oil Burp-Less 1200 MG Caps Take 2,400 mg by mouth every evening.   folic acid 706 MCG tablet Commonly known as: FOLVITE Take 800 mcg by mouth daily.   furosemide 20 MG tablet Commonly known as: LASIX Take 20 mg by mouth daily.   levothyroxine 125 MCG tablet Commonly known as: SYNTHROID Take 125 mcg by mouth daily before breakfast.   Magnesium Oxide 420 (252 Mg) MG Tabs Take 420-840 mg by mouth as needed (for cramps).   montelukast 10 MG tablet Commonly known as: SINGULAIR Take 10 mg by mouth every evening.   nitroGLYCERIN 0.4 MG SL tablet Commonly known as: NITROSTAT Place 0.4 mg under the tongue every 5 (five) minutes as needed for chest pain.   pantoprazole 40 MG tablet Commonly known as: Protonix 40 mg twice a day for 4 weeks followed by Protonix 40 mg once a day. What changed:  how much to take how to take this when to take this additional instructions   vitamin B-12 1000 MCG tablet Commonly known as: CYANOCOBALAMIN Take 1,000 mcg by mouth daily.        Follow-up Information     Rogers Blocker, MD. Schedule an appointment as soon as possible for a visit in 1 week(s).   Specialty: Internal Medicine Why: For hospital follow-up Contact information: [address] [place] [address] [phone]         Wilford Corner, MD. Schedule an appointment as soon as possible for a visit in 4 week(s).   Specialty: Gastroenterology Why: For hospital follow-up Contact information: [address] [place] [address] [phone]                 Discharge Exam: Danley Danker Weights   07/06/21 0915 07/07/21 1026  Weight: 82.1  kg 82.1 kg   General exam: Appears calm and comfortable Respiratory system: Clear to auscultation. Respiratory effort normal. Cardiovascular system: S1 & S2 heard, RRR. Gastrointestinal system: Abdomen is nondistended, soft and nontender. No organomegaly or masses felt. Normal bowel sounds heard. Central nervous system: Alert and oriented. No focal neurological deficits. Musculoskeletal: No edema. No calf tenderness Skin: No cyanosis. No rashes Psychiatry: Judgement and insight appear normal. Mood & affect appropriate.   Condition at discharge: stable  The results of significant diagnostics from this hospitalization (including imaging, microbiology, ancillary and laboratory) are listed below for reference.   Imaging Studies: No results found.  Microbiology: Results for orders placed or performed during the hospital encounter of 07/05/21  Resp Panel by RT-PCR (Flu A&B, Covid) Nasopharyngeal Swab     Status: None   Collection Time: 07/05/21 12:11 AM   Specimen: Nasopharyngeal Swab; Nasopharyngeal(NP) swabs in vial transport medium  Result Value Ref Range Status   SARS Coronavirus 2 by RT PCR NEGATIVE NEGATIVE Final    Comment: (NOTE) SARS-CoV-2 target nucleic acids are NOT DETECTED.  The SARS-CoV-2 RNA is generally detectable in upper respiratory specimens during the acute phase of infection. The lowest concentration of SARS-CoV-2 viral copies this assay can detect is 138 copies/mL. A negative result does not preclude SARS-Cov-2 infection and should not be used as the sole basis for treatment or other patient management decisions. A negative result may occur with  improper specimen collection/handling, submission of specimen other than nasopharyngeal swab, presence of viral mutation(s) within the areas targeted by this assay, and inadequate number of viral copies(<138 copies/mL). A negative result must be combined with clinical observations, patient history, and  epidemiological information. The expected result is Negative.  Fact Sheet for Patients:  EntrepreneurPulse.com.au  Fact Sheet for Healthcare Providers:  IncredibleEmployment.be  This test is no t yet approved or cleared by the [place] FDA and  has been authorized for detection and/or diagnosis of SARS-CoV-2 by FDA under an Emergency Use Authorization (EUA). This EUA will remain  in effect (meaning this test can be used) for the duration of the COVID-19 declaration under Section 564(b)(1) of the Act, 21 U.S.C.section 360bbb-3(b)(1), unless the authorization is terminated  or revoked sooner.       Influenza A by PCR NEGATIVE NEGATIVE Final   Influenza B by PCR NEGATIVE NEGATIVE Final    Comment: (NOTE) The Xpert Xpress SARS-CoV-2/FLU/RSV plus assay is intended as an aid in the diagnosis of influenza from Nasopharyngeal swab specimens and should not be used as a sole basis for treatment. Nasal washings and aspirates are unacceptable for Xpert Xpress SARS-CoV-2/FLU/RSV testing.  Fact Sheet for Patients: EntrepreneurPulse.com.au  Fact Sheet for Healthcare Providers: IncredibleEmployment.be  This test is not yet approved or cleared by the [place] FDA and has been authorized for detection and/or diagnosis of SARS-CoV-2 by FDA under an Emergency Use Authorization (EUA). This EUA will remain in effect (meaning this test can be used) for the duration of the COVID-19 declaration under Section 564(b)(1) of the Act, 21 U.S.C. section 360bbb-3(b)(1), unless the authorization is terminated or revoked.  Performed at [facility], [address] [place], [place] [address]     Labs: CBC: Recent Labs  Lab 07/05/21 1355 07/06/21 1036 07/06/21 1418 07/07/21 1231 07/08/21 1011 07/09/21 0641  WBC 7.0  --  5.6 7.8 6.4 6.6  NEUTROABS 5.6  --   --  5.7  --   --   HGB 6.2* 8.2* 8.1*  8.7* 8.2* 8.3*  HCT 20.9* 26.3* 26.0* 28.5* 26.5* 27.4*  MCV 88.9  --  88.1 89.3 88.3 89.3  PLT 286  --  237 254 241 416   Basic Metabolic Panel: Recent Labs  Lab 07/05/21 1355 07/06/21 1418  NA 141 139  K 3.9 3.8  CL 105 105  CO2 30 27  GLUCOSE 121* 98  BUN 22 19  CREATININE 1.07* 0.87  CALCIUM 8.9 8.5*   Liver Function Tests: Recent Labs  Lab 07/05/21 1355  AST 18  ALT 10  ALKPHOS 100  BILITOT 0.5  PROT 6.4*  ALBUMIN 3.6    Discharge time spent: 35 minutes.  Signed: Cordelia Poche, MD [facility] 07/09/2021

## 2021-07-09 NOTE — Discharge Instructions (Signed)
[place]  [place] were in the hospital with GI bleeding. You had some blood vessels in your stomach/intestines that were fixed to help minimize this from happening in the future. Please follow-up with your primary care physician.

## 2021-07-09 NOTE — Progress Notes (Signed)
Went over discharge instructions w/ pt. Pt verbalized understanding.  

## 2021-07-09 NOTE — Progress Notes (Signed)
[facility]astroenterology Progress Note  Lacey Vega 71 y.o. 04/23/1951  CC:  Melena, anemia   Subjective: Patient resting comfortably in bed.  She is feeling well today and denies nausea, vomiting, or abdominal pain.  Patient states she has not had a bowel movement today and did not have one yesterday.  Her last BM 2 days ago was dark and tarry. She has been eating and tolerating food well.   ROS : Review of Systems  Constitutional:  Negative for chills and fever.  Cardiovascular:  Negative for chest pain and palpitations.  Gastrointestinal:  Negative for abdominal pain, nausea and vomiting.     Objective: Vital signs in last 24 hours: Vitals:   07/09/21 0300 07/09/21 0732  BP: (!) 112/45   Pulse: 77   Resp: 15   Temp: 99 F (37.2 C)   SpO2: 100% 96%    Physical Exam:  General:  Alert, cooperative, no distress, appears stated age  Head:  Normocephalic, without obvious abnormality, atraumatic  Eyes:  Anicteric sclera, EOM's intact  Lungs:   Clear to auscultation bilaterally, respirations unlabored  Heart:  Regular rate and rhythm, S1, S2 normal  Abdomen:   Soft, non-tender, bowel sounds active all four quadrants,  no masses,   Extremities: Extremities normal, atraumatic, no  edema  Pulses: 2+ and symmetric    Lab Results: Recent Labs    07/06/21 1418  NA 139  K 3.8  CL 105  CO2 27  GLUCOSE 98  BUN 19  CREATININE 0.87  CALCIUM 8.5*   No results for input(s): AST, ALT, ALKPHOS, BILITOT, PROT, ALBUMIN in the last 72 hours. Recent Labs    07/07/21 1231 07/08/21 1011 07/09/21 0641  WBC 7.8 6.4 6.6  NEUTROABS 5.7  --   --   HGB 8.7* 8.2* 8.3*  HCT 28.5* 26.5* 27.4*  MCV 89.3 88.3 89.3  PLT 254 241 243   Recent Labs    07/06/21 1418  LABPROT 13.7  INR 1.1      Assessment Melena, anemia -Hgb 8.3, stable. Per chart review, patient's baseline appears to be around 8.0 - 9.0 (8.1 on 05/2018, 9.5 on 03/2012). -BUN 19, creatinine 0.87 -Iron 51,  ferritin 155 -Small bowel enteroscopy 2/5: Normal esophagus, single nonbleeding angiodysplastic lesion in the stomach, treated with APC, 3 nonbleeding angiodysplastic lesions in the duodenum, treated with APC, jejunum normal.  No specimens collected  CKD  PAD  Asthma with COPD   Plan: Small bowel enteroscopy 2/5, 4 angiodysplastic lesions treated with APC.   Continue to monitor bowel movements and hemoglobin. Continue daily CBC and transfuse as needed to maintain HGB > 7  Continue supportive care Continue diet as tolerated Can resume ASA/Plavix as deemed appropriate by primary team Hemoglobin is stable and likely is at patient's baseline. [facility] can consider signing off.  Angelique Holm PA-C 07/09/2021, 9:41 AM  Contact #  [phone]

## 2021-07-30 ENCOUNTER — Telehealth: Payer: Self-pay | Admitting: *Deleted

## 2021-07-30 ENCOUNTER — Inpatient Hospital Stay: Payer: Medicare HMO

## 2021-07-30 ENCOUNTER — Other Ambulatory Visit: Payer: Self-pay

## 2021-07-30 ENCOUNTER — Other Ambulatory Visit: Payer: Self-pay | Admitting: *Deleted

## 2021-07-30 DIAGNOSIS — D573 Sickle-cell trait: Secondary | ICD-10-CM | POA: Diagnosis not present

## 2021-07-30 DIAGNOSIS — D500 Iron deficiency anemia secondary to blood loss (chronic): Secondary | ICD-10-CM | POA: Diagnosis not present

## 2021-07-30 LAB — CBC WITH DIFFERENTIAL (CANCER CENTER ONLY)
Abs Immature Granulocytes: 0.03 10*3/uL (ref 0.00–0.07)
Basophils Absolute: 0 10*3/uL (ref 0.0–0.1)
Basophils Relative: 0 %
Eosinophils Absolute: 0.2 10*3/uL (ref 0.0–0.5)
Eosinophils Relative: 2 %
HCT: 20.8 % — ABNORMAL LOW (ref 36.0–46.0)
Hemoglobin: 6.2 g/dL — CL (ref 12.0–15.0)
Immature Granulocytes: 0 %
Lymphocytes Relative: 18 %
Lymphs Abs: 1.3 10*3/uL (ref 0.7–4.0)
MCH: 25.3 pg — ABNORMAL LOW (ref 26.0–34.0)
MCHC: 29.8 g/dL — ABNORMAL LOW (ref 30.0–36.0)
MCV: 84.9 fL (ref 80.0–100.0)
Monocytes Absolute: 0.6 10*3/uL (ref 0.1–1.0)
Monocytes Relative: 9 %
Neutro Abs: 5 10*3/uL (ref 1.7–7.7)
Neutrophils Relative %: 71 %
Platelet Count: 499 10*3/uL — ABNORMAL HIGH (ref 150–400)
RBC: 2.45 MIL/uL — ABNORMAL LOW (ref 3.87–5.11)
RDW: 17.1 % — ABNORMAL HIGH (ref 11.5–15.5)
WBC Count: 7.1 10*3/uL (ref 4.0–10.5)
nRBC: 0.4 % — ABNORMAL HIGH (ref 0.0–0.2)

## 2021-07-30 LAB — PREPARE RBC (CROSSMATCH)

## 2021-07-30 LAB — SAMPLE TO BLOOD BANK

## 2021-07-30 NOTE — Telephone Encounter (Signed)
Hgb at [facility] 6.2, second unit of blood added for transfusion tomorrow. Pt notified. Dr Marlou Sa notified

## 2021-07-30 NOTE — Telephone Encounter (Signed)
Dr Marlou Sa states Ms Lacey Vega's Hgb has dropped to 7.0, is requesting we transfuse this week. Dr Lorenso Courier ordered 1 unit of blood. RN spoke with patient. She is going to call and let us know when her daughter can bring her to [facility] for labs.

## 2021-07-31 ENCOUNTER — Inpatient Hospital Stay: Payer: Medicare HMO | Attending: Hematology and Oncology

## 2021-07-31 DIAGNOSIS — D500 Iron deficiency anemia secondary to blood loss (chronic): Secondary | ICD-10-CM | POA: Diagnosis not present

## 2021-07-31 DIAGNOSIS — D573 Sickle-cell trait: Secondary | ICD-10-CM | POA: Insufficient documentation

## 2021-07-31 MED ORDER — DIPHENHYDRAMINE HCL 25 MG PO CAPS
25.0000 mg | ORAL_CAPSULE | Freq: Once | ORAL | Status: AC
  Administered 2021-07-31: 25 mg via ORAL
  Filled 2021-07-31: qty 1

## 2021-07-31 MED ORDER — ACETAMINOPHEN 325 MG PO TABS
650.0000 mg | ORAL_TABLET | Freq: Once | ORAL | Status: AC
  Administered 2021-07-31: 650 mg via ORAL
  Filled 2021-07-31: qty 2

## 2021-07-31 MED ORDER — HEPARIN SOD (PORK) LOCK FLUSH 100 UNIT/ML IV SOLN: 500.0000 [IU] | Freq: Every day | INTRAVENOUS | Status: DC | PRN

## 2021-07-31 MED ORDER — SODIUM CHLORIDE 0.9% IV SOLUTION
250.0000 mL | Freq: Once | INTRAVENOUS | Status: AC
  Administered 2021-07-31: 250 mL via INTRAVENOUS

## 2021-07-31 MED ORDER — SODIUM CHLORIDE 0.9% FLUSH: 10.0000 mL | INTRAVENOUS | Status: DC | PRN

## 2021-07-31 NOTE — Patient Instructions (Signed)
Blood Transfusion, Adult, Care After This sheet gives you information about how to care for yourself after your procedure. Your doctor may also give you more specific instructions. If you have problems or questions, contact your doctor. What can I expect after the procedure? After the procedure, it is common to have: Bruising and soreness at the IV site. A headache. Follow these instructions at home: Insertion site care   Follow instructions from your doctor about how to take care of your insertion site. This is where an IV tube was put into your vein. Make sure you: Wash your hands with soap and water before and after you change your bandage (dressing). If you cannot use soap and water, use hand sanitizer. Change your bandage as told by your doctor. Check your insertion site every day for signs of infection. Check for: Redness, swelling, or pain. Bleeding from the site. Warmth. Pus or a bad smell. General instructions Take over-the-counter and prescription medicines only as told by your doctor. Rest as told by your doctor. Go back to your normal activities as told by your doctor. Keep all follow-up visits as told by your doctor. This is important. Contact a doctor if: You have itching or red, swollen areas of skin (hives). You feel worried or nervous (anxious). You feel weak after doing your normal activities. You have redness, swelling, warmth, or pain around the insertion site. You have blood coming from the insertion site, and the blood does not stop with pressure. You have pus or a bad smell coming from the insertion site. Get help right away if: You have signs of a serious reaction. This may be coming from an allergy or the body's defense system (immune system). Signs include: Trouble breathing or shortness of breath. Swelling of the face or feeling warm (flushed). Fever or chills. Head, chest, or back pain. Dark pee (urine) or blood in the pee. Widespread rash. Fast  heartbeat. Feeling dizzy or light-headed. You may receive your blood transfusion in an outpatient setting. If so, you will be told whom to contact to report any reactions. These symptoms may be an emergency. Do not wait to see if the symptoms will go away. Get medical help right away. Call your local emergency services (911 in the [place] Do not drive yourself to the hospital. Summary Bruising and soreness at the IV site are common. Check your insertion site every day for signs of infection. Rest as told by your doctor. Go back to your normal activities as told by your doctor. Get help right away if you have signs of a serious reaction. This information is not intended to replace advice given to you by your health care provider. Make sure you discuss any questions you have with your health care provider. Document Revised: 09/13/2020 Document Reviewed: 11/11/2018 Elsevier Patient Education  2022 Else[employer] Inc.  

## 2021-08-01 ENCOUNTER — Other Ambulatory Visit: Payer: Self-pay | Admitting: Gastroenterology

## 2021-08-01 ENCOUNTER — Encounter (HOSPITAL_COMMUNITY): Payer: Self-pay | Admitting: Gastroenterology

## 2021-08-01 LAB — TYPE AND SCREEN
ABO/RH(D): A POS
Antibody Screen: POSITIVE
Donor AG Type: NEGATIVE
Donor AG Type: NEGATIVE
PT AG Type: NEGATIVE
Unit division: 0
Unit division: 0
Unit division: 0

## 2021-08-01 LAB — BPAM RBC
Blood Product Expiration Date: 202303302359
Blood Product Expiration Date: 202304022359
Blood Product Expiration Date: 202304022359
ISSUE DATE / TIME: 202303011252
ISSUE DATE / TIME: 202303011252
Unit Type and Rh: 5100
Unit Type and Rh: 5100
Unit Type and Rh: 5100

## 2021-08-05 NOTE — H&P (Signed)
HPI: ?70/female with ongoing anemia, history of SB AVM. ? ?Vital Signs  ?Wt 177, Wt change -0.8 lbs, Ht 66, BMI 28.57.  ? ?Current Medications  ?Aspirin 81 MG Tablet Delayed Release 1 tablet Orally Once a day ?Atorvastatin Calcium 40 MG Tablet 1 tablet Orally at bedtime ?Biotin 5000(Biotin) 5 MG Capsule 2 capsules Orally Once a day ?Carisoprodol 250 MG Tablet 1 tablet as needed Orally Four times a day ?Centrum Silver - Tablet 1 tablet Orally Once a day ?Clopidogrel Bisulfate 75 MG Tablet 1 tablet Orally Once a day ?Docusate Sodium 100 MG Capsule 1 capsule as needed Orally Once a day ?Donepezil HCl 5 MG Tablet 1 tablet at bedtime Orally Once a day ?Ferrous Gluconate 324 (38 Fe) MG Tablet 1 tablet Orally three times a day ?Furosemide 20 MG Tablet 1 tablet Orally Once a day ?Levothyroxine Sodium 125 MCG Tablet 1 tablet every morning on an empty stomach Orally Once a day ?Magnesium 500 MG Tablet 1 tablet with a meal Orally Once a day ?Montelukast Sodium 10 MG Tablet 1 tablet Orally Once a day ?Nitroglycerin 0.4 MG Tablet Sublingual as directed Sublingual , Notes: prn ?Pantoprazole Sodium 40 MG Tablet Delayed Release 1 tablet Orally Once a day ?Vitamin B12 1000 MCG Tablet Extended Release 1 tablet Orally Once a day  ? ? ? ?Past Medical History  ?     Iron deficiency anemia. ?     COPD. ?     DJD. ?     Gastritis. ?     Restrictive lung disease. ?     Colonic polyps. ?     Anxiety. ?     Reflux. ?     Hypothyroidism. ?     Hyperlipidemia. ?     Diabetes mellitus. ?     Gallstones. ?     Dysphagia. ?     Seasonal allergies. ?     Chest pain. ?     Hot flashes. ?     Asthma. ?     Headache. ?     AAA. ?     Duodenal and gastric AVM requiring APC.  ? ?Surgical History  ?      neck surgery   ?      hysterectomy   ?      cholecystectomy   ?      colonoscopy 03/2009,12/2011,06/2016  ?      wisdom teeth extraction   ?      dental surgery   ?      EGD-07/2009,10/2009,11/2010,04/2011,12/2011,08/2015,09/2017,05/2019   ?      rotator cuff,  right shoulder 2014  ?      Capsule Endo 08/2018  ?      EGD Hospital 04/24/2021  ?      AAA 01/2021  ?      Stent placed 03/29/2021  ?      Blood transfusion 04/2021  ?      Transfusions (weekly for month of 06/2021) 06/2021   ? ?Family History  ?Father: deceased, heart attack  ?Mother: deceased, diabetes  ?3 brother(s) , 1 sister(s) . 1 son(s) , 1 daughter(s) .   ?neg family history of colon cancer, polyps or liver disease.   ? ?Social History  ?General:   ?Tobacco use   ?    cigarettes:  Former smoker ?    Quit in year  2002 ?    Tobacco history last updated  06/14/2021 ?    Vaping  No ?no EXPOSURE  TO PASSIVE SMOKE.   ?no Alcohol.  ?no Recreational drug use.   ?  ? ?Allergies  ?N.K.D.A.   ? ?  ? ?Review of Systems  ?GI PROCEDURE:   ?       Pacemaker/ AICD no.  Artificial heart valves no.  MI/heart attack no.  Abnormal heart rhythm no.  Angina no.  CVA no.  Hypertension no.  Hypotension no.  Asthma, COPD YES, Asthma w/ COPD inhaler PRN but can't find them .  Sleep apnea no.  Seizure disorders no.  Artificial joints no.  Severe DJD no.  Diabetes YES, type II.  Significant headaches no.  Vertigo no.  Depression/anxiety YES.  Abnormal bleeding YES, Taking blood thinners.  Kidney Disease no.  Liver disease no.  Chance of pregnancy no.  Blood transfusion YES.       ?  ?Examination  ?Gastroenterology:: ?       GENERAL APPEARANCE:  lethargic, elderly, limited mobility, pleasant, well-nourished.  ?SCLERA: anicteric.  ?CARDIOVASCULAR  normal.  ?RESPIRATORY  clear to auscultation bilaterally.  ?ABDOMEN diffuse tenderness (greatest in RLQ) with guarding, soft, nondistended, +BS.  ?RECTAL:  deferred.  ?EXTREMITIES:  no edema.  ?PSYCH: mood/affect normal.   ? 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 ? ?

## 2021-08-06 ENCOUNTER — Ambulatory Visit (HOSPITAL_COMMUNITY)
Admission: RE | Admit: 2021-08-06 | Discharge: 2021-08-06 | Disposition: A | Discharge status: Home / Self Care | Payer: Medicare HMO | Source: Ambulatory Visit | Attending: Gastroenterology | Admitting: Gastroenterology

## 2021-08-06 ENCOUNTER — Ambulatory Visit (HOSPITAL_BASED_OUTPATIENT_CLINIC_OR_DEPARTMENT_OTHER): Payer: Medicare HMO | Admitting: Certified Registered Nurse Anesthetist

## 2021-08-06 ENCOUNTER — Ambulatory Visit (HOSPITAL_COMMUNITY): Payer: Medicare HMO | Admitting: Certified Registered Nurse Anesthetist

## 2021-08-06 ENCOUNTER — Other Ambulatory Visit: Payer: Self-pay

## 2021-08-06 ENCOUNTER — Encounter (HOSPITAL_COMMUNITY): Payer: Self-pay | Admitting: Gastroenterology

## 2021-08-06 ENCOUNTER — Encounter (HOSPITAL_COMMUNITY)
Admission: RE | Disposition: A | Discharge status: Home / Self Care | Payer: Self-pay | Source: Ambulatory Visit | Attending: Gastroenterology

## 2021-08-06 DIAGNOSIS — E039 Hypothyroidism, unspecified: Secondary | ICD-10-CM | POA: Insufficient documentation

## 2021-08-06 DIAGNOSIS — D649 Anemia, unspecified: Secondary | ICD-10-CM | POA: Insufficient documentation

## 2021-08-06 DIAGNOSIS — K219 Gastro-esophageal reflux disease without esophagitis: Secondary | ICD-10-CM | POA: Insufficient documentation

## 2021-08-06 DIAGNOSIS — I10 Essential (primary) hypertension: Secondary | ICD-10-CM | POA: Diagnosis not present

## 2021-08-06 DIAGNOSIS — Z87891 Personal history of nicotine dependence: Secondary | ICD-10-CM | POA: Diagnosis not present

## 2021-08-06 DIAGNOSIS — K31811 Angiodysplasia of stomach and duodenum with bleeding: Secondary | ICD-10-CM | POA: Diagnosis not present

## 2021-08-06 DIAGNOSIS — K449 Diaphragmatic hernia without obstruction or gangrene: Secondary | ICD-10-CM | POA: Insufficient documentation

## 2021-08-06 DIAGNOSIS — M1990 Unspecified osteoarthritis, unspecified site: Secondary | ICD-10-CM | POA: Diagnosis not present

## 2021-08-06 DIAGNOSIS — J449 Chronic obstructive pulmonary disease, unspecified: Secondary | ICD-10-CM | POA: Insufficient documentation

## 2021-08-06 DIAGNOSIS — E1151 Type 2 diabetes mellitus with diabetic peripheral angiopathy without gangrene: Secondary | ICD-10-CM | POA: Diagnosis not present

## 2021-08-06 DIAGNOSIS — K2101 Gastro-esophageal reflux disease with esophagitis, with bleeding: Secondary | ICD-10-CM | POA: Insufficient documentation

## 2021-08-06 DIAGNOSIS — Z8673 Personal history of transient ischemic attack (TIA), and cerebral infarction without residual deficits: Secondary | ICD-10-CM | POA: Diagnosis not present

## 2021-08-06 DIAGNOSIS — K259 Gastric ulcer, unspecified as acute or chronic, without hemorrhage or perforation: Secondary | ICD-10-CM | POA: Diagnosis not present

## 2021-08-06 DIAGNOSIS — K254 Chronic or unspecified gastric ulcer with hemorrhage: Secondary | ICD-10-CM | POA: Diagnosis not present

## 2021-08-06 DIAGNOSIS — K2091 Esophagitis, unspecified with bleeding: Secondary | ICD-10-CM

## 2021-08-06 HISTORY — PX: ENTEROSCOPY: SHX5533

## 2021-08-06 HISTORY — PX: HOT HEMOSTASIS: SHX5433

## 2021-08-06 LAB — POCT I-STAT, CHEM 8
BUN: 20 mg/dL (ref 8–23)
Calcium, Ion: 1.3 mmol/L (ref 1.15–1.40)
Chloride: 108 mmol/L (ref 98–111)
Creatinine, Ser: 1 mg/dL (ref 0.44–1.00)
Glucose, Bld: 94 mg/dL (ref 70–99)
HCT: 27 % — ABNORMAL LOW (ref 36.0–46.0)
Hemoglobin: 9.2 g/dL — ABNORMAL LOW (ref 12.0–15.0)
Potassium: 4 mmol/L (ref 3.5–5.1)
Sodium: 143 mmol/L (ref 135–145)
TCO2: 27 mmol/L (ref 22–32)

## 2021-08-06 SURGERY — ENTEROSCOPY
Anesthesia: Monitor Anesthesia Care

## 2021-08-06 MED ORDER — PROPOFOL 1000 MG/100ML IV EMUL
INTRAVENOUS | Status: AC
  Filled 2021-08-06: qty 100

## 2021-08-06 MED ORDER — LIDOCAINE 2% (20 MG/ML) 5 ML SYRINGE
INTRAMUSCULAR | Status: DC | PRN
  Administered 2021-08-06: 60 mg via INTRAVENOUS

## 2021-08-06 MED ORDER — SODIUM CHLORIDE 0.9 % IV SOLN: INTRAVENOUS | Status: DC

## 2021-08-06 MED ORDER — LACTATED RINGERS IV SOLN
INTRAVENOUS | Status: DC | PRN
Start: 2021-08-06 — End: 2021-08-06

## 2021-08-06 MED ORDER — PROPOFOL 10 MG/ML IV BOLUS
INTRAVENOUS | Status: DC | PRN
  Administered 2021-08-06: 30 mg via INTRAVENOUS
  Administered 2021-08-06: 20 mg via INTRAVENOUS

## 2021-08-06 MED ORDER — PROPOFOL 500 MG/50ML IV EMUL
INTRAVENOUS | Status: DC | PRN
  Administered 2021-08-06: 125 ug/kg/min via INTRAVENOUS

## 2021-08-06 NOTE — Op Note (Signed)
[facility] ?Patient Name: Lacey Vega ?Procedure Date: 08/06/2021 ?MRN: [id number] ?Attending MD: Ronnette Juniper , MD ?Date of Birth: May 01, 1951 ?CSN: [id number] ?Age: 71 ?Admit Type: Inpatient ?Procedure:                Small bowel enteroscopy ?Indications:              Arteriovenous malformation in the small intestine ?Providers:                Ronnette Juniper, MD, Carlyn Reichert, RN, Despina Pole,  ?                          Technician, Tyna Jaksch Technician, [facility] [place] ?Referring MD:             Randall Hiss Dean,MD ?Medicines:                Monitored Anesthesia Care ?Complications:            No immediate complications. Estimated blood loss:  ?                          Minimal. ?Estimated Blood Loss:     Estimated blood loss was minimal. ?Procedure:                Pre-Anesthesia Assessment: ?                          - Prior to the procedure, a History and Physical  ?                          was performed, and patient medications and  ?                          allergies were reviewed. The patient's tolerance of  ?                          previous anesthesia was also reviewed. The risks  ?                          and benefits of the procedure and the sedation  ?                          options and risks were discussed with the patient.  ?                          All questions were answered, and informed consent  ?                          was obtained. Prior Anticoagulants: The patient has  ?                          taken Plavix (clopidogrel), last dose was 5 days  ?                          prior to procedure. ASA Grade Assessment: III - A  ?  patient with severe systemic disease. After  ?                          reviewing the risks and benefits, the patient was  ?                          deemed in satisfactory condition to undergo the  ?                          procedure. ?                          After obtaining informed consent, the endoscope was  ?                           passed under direct vision. Throughout the  ?                          procedure, the patient's blood pressure, pulse, and  ?                          oxygen saturations were monitored continuously. The  ?                          PCF-H190TL ([id number]) Olympus slim colonoscope was  ?                          introduced through the mouth and advanced to the  ?                          jejunum, to the 160 cm mark (from the incisors).  ?                          The small bowel enteroscopy was accomplished  ?                          without difficulty. The patient tolerated the  ?                          procedure well. ?Scope In: ?Scope Out: ?Findings: ?     LA Grade A (one or more mucosal breaks less than 5 mm, not extending  ?     between tops of 2 mucosal folds) esophagitis with minimal oozing was  ?     found 34 to 35 cm from the incisors. ?     One non-bleeding superficial gastric ulcer with a clean ulcer base  ?     (Forrest Class III) was found in the gastric antrum. The lesion was 6 mm  ?     in largest dimension. ?     The cardia and gastric fundus were normal on retroflexion. ?     Multiple angioectasias with stigmata of recent bleeding were found in  ?     the duodenal bulb and in the first portion of the duodenum. Coagulation  ?     for tissue destruction using argon plasma at 0.5 liters/minute and 20  ?  watts was successful. ?Impression:               - LA Grade A esophagitis with bleeding. ?                          - Non-bleeding gastric ulcer with a clean ulcer  ?                          base (Forrest Class III). ?                          - Multiple recently bleeding angioectasias in the  ?                          duodenum. Treated with argon plasma coagulation  ?                          (APC). ?                          - No specimens collected. ?Moderate Sedation: ?     Patient did not receive moderate sedation for this procedure, but  ?     instead received monitored anesthesia  care. ?Recommendation:           - Resume regular diet. ?                          - Resume Plavix (clopidogrel) at prior dose  ?                          tomorrow. ?                          - PPI once a day indefinitely. ?Procedure Code(s):        --- Professional --- ?                          [phone], Small intestinal endoscopy, enteroscopy  ?                          beyond second portion of duodenum, not including  ?                          ileum; with control of bleeding (eg, injection,  ?                          bipolar cautery, unipolar cautery, laser, heater  ?                          probe, stapler, plasma coagulator) ?Diagnosis Code(s):        --- Professional --- ?                          K20.91, Esophagitis, unspecified with bleeding ?                          K25.9, Gastric ulcer, unspecified as  acute or  ?                          chronic, without hemorrhage or perforation ?                          K31.811, Angiodysplasia of stomach and duodenum  ?                          with bleeding ?CPT copyright 2019 American Medical Association. All rights reserved. ?The codes documented in this report are preliminary and upon coder review may  ?be revised to meet current compliance requirements. ?Ronnette Juniper, MD ?08/06/2021 12:35:31 PM ?This report has been signed electronically. ?Number of Addenda: 0 ?

## 2021-08-06 NOTE — Interval H&P Note (Signed)
History and Physical Interval Note: ?70/female with gastric and SB AVMs and anemia for Enteroscopy with possible APC, last dose of plavix was ?last week as per patient. ? ?08/06/2021 ?11:57 AM ? ?Lacey Vega  has presented today for enteroscopy with APC, with the diagnosis of anemia/small bowel AVM's.  The various methods of treatment have been discussed with the patient and family. After consideration of risks, benefits and other options for treatment, the patient has consented to  Procedure(s): ?ENTEROSCOPY (N/A) ?HOT HEMOSTASIS (ARGON PLASMA COAGULATION/BICAP) (N/A) as a surgical intervention.  The patient's history has been reviewed, patient examined, no change in status, stable for surgery.  I have reviewed the patient's chart and labs.  Questions were answered to the patient's satisfaction.   ? ? ?Ronnette Juniper ? ? ?

## 2021-08-06 NOTE — Anesthesia Preprocedure Evaluation (Addendum)
Anesthesia Evaluation  ?Patient identified by MRN, date of birth, ID band ?Patient awake ? ? ? ?Reviewed: ?Allergy & Precautions, Patient's Chart, lab work & pertinent test results ? ?Airway ?Mallampati: I ? ?TM Distance: >3 FB ?Neck ROM: Full ? ? ? Dental ? ?(+) Edentulous Upper, Edentulous Lower, Dental Advisory Given ?  ?Pulmonary ?asthma , COPD, former smoker,  ?  ?Pulmonary exam normal ?breath sounds clear to auscultation ? ? ? ? ? ? Cardiovascular ?hypertension, + Peripheral Vascular Disease  ?Normal cardiovascular exam ?Rhythm:Regular Rate:Normal ? ? ?  ?Neuro/Psych ? Headaches, PSYCHIATRIC DISORDERS Anxiety TIA  ? GI/Hepatic ?Neg liver ROS, hiatal hernia, GERD  Medicated,  ?Endo/Other  ?diabetesHypothyroidism  ? Renal/GU ?Renal InsufficiencyRenal disease  ?negative genitourinary ?  ?Musculoskeletal ? ?(+) Arthritis ,  ? Abdominal ?  ?Peds ? Hematology ? ?(+) Blood dyscrasia (on plavix), anemia ,   ?Anesthesia Other Findings ? ? Reproductive/Obstetrics ? ?  ? ? ? ? ? ? ? ? ? ? ? ? ? ?  ?  ? ? ? ? ? ? ? ?Anesthesia Physical ?Anesthesia Plan ? ?ASA: 3 ? ?Anesthesia Plan: MAC  ? ?Post-op Pain Management:   ? ?Induction: Intravenous ? ?PONV Risk Score and Plan: Propofol infusion and Treatment may vary due to age or medical condition ? ?Airway Management Planned: Natural Airway ? ?Additional Equipment:  ? ?Intra-op Plan:  ? ?Post-operative Plan:  ? ?Informed Consent: I have reviewed the patients History and Physical, chart, labs and discussed the procedure including the risks, benefits and alternatives for the proposed anesthesia with the patient or authorized representative who has indicated his/her understanding and acceptance.  ? ? ? ?Dental advisory given ? ?Plan Discussed with: CRNA ? ?Anesthesia Plan Comments:   ? ? ? ? ? ? ?Anesthesia Quick Evaluation ? ?

## 2021-08-06 NOTE — Discharge Instructions (Signed)

## 2021-08-06 NOTE — Transfer of Care (Signed)
Immediate Anesthesia Transfer of Care Note ? ?Patient: Lacey Vega ? ?Procedure(s) Performed: ENTEROSCOPY ?HOT HEMOSTASIS (ARGON PLASMA COAGULATION/BICAP) ? ?Patient Location: Endoscopy Unit ? ?Anesthesia Type:MAC ? ?Level of Consciousness: drowsy ? ?Airway & Oxygen Therapy: Patient Spontanous Breathing and Patient connected to face mask ? ?Post-op Assessment: Report given to RN and Post -op Vital signs reviewed and stable ? ?Post vital signs: Reviewed and stable ? ?Last Vitals:  ?Vitals Value Taken Time  ?BP    ?Temp    ?Pulse 65 08/06/21 1235  ?Resp 11 08/06/21 1235  ?SpO2 100 % 08/06/21 1235  ?Vitals shown include unvalidated device data. ? ?Last Pain:  ?Vitals:  ? 08/06/21 1113  ?TempSrc: Temporal  ?PainSc: 0-No pain  ?   ? ?  ? ?Complications: No notable events documented. ?

## 2021-08-06 NOTE — Anesthesia Postprocedure Evaluation (Signed)
Anesthesia Post Note ? ?Patient: Lacey Vega ? ?Procedure(s) Performed: ENTEROSCOPY ?HOT HEMOSTASIS (ARGON PLASMA COAGULATION/BICAP) ? ?  ? ?Patient location during evaluation: Endoscopy ?Anesthesia Type: MAC ?Level of consciousness: awake and alert ?Pain management: pain level controlled ?Vital Signs Assessment: post-procedure vital signs reviewed and stable ?Respiratory status: spontaneous breathing, nonlabored ventilation, respiratory function stable and patient connected to nasal cannula oxygen ?Cardiovascular status: blood pressure returned to baseline and stable ?Postop Assessment: no apparent nausea or vomiting ?Anesthetic complications: no ? ? ?No notable events documented. ? ?Last Vitals:  ?Vitals:  ? 08/06/21 1250 08/06/21 1300  ?BP: 120/63 140/70  ?Pulse: 70 68  ?Resp: 19 16  ?Temp:    ?SpO2: 96% 98%  ?  ?Last Pain:  ?Vitals:  ? 08/06/21 1300  ?TempSrc:   ?PainSc: 0-No pain  ? ? ?  ?  ?  ?  ?  ?  ? ?[name] ? ? ? ? ?

## 2021-08-06 NOTE — Anesthesia Procedure Notes (Signed)
Procedure Name: [place] ?Date/Time: 08/06/2021 12:18 PM ?Performed by: Claudia Desanctis, CRNA ?Pre-anesthesia Checklist: Patient identified, Emergency Drugs available, Suction available and Patient being monitored ?Patient Re-evaluated:Patient Re-evaluated prior to induction ?Oxygen Delivery Method: Simple face mask ? ? ? ? ?

## 2021-08-12 ENCOUNTER — Other Ambulatory Visit: Payer: Self-pay | Admitting: Pharmacy Technician

## 2021-09-20 ENCOUNTER — Telehealth: Payer: Self-pay | Admitting: Internal Medicine

## 2021-09-20 ENCOUNTER — Telehealth: Payer: Self-pay | Admitting: Hematology and Oncology

## 2021-09-20 ENCOUNTER — Other Ambulatory Visit: Payer: Self-pay | Admitting: Hematology and Oncology

## 2021-09-20 NOTE — Telephone Encounter (Signed)
?   Lacey Vega ?DOB: [birth year] ?MRN: [id number]  ? ?RIDER WAIVER AND RELEASE OF LIABILITY ? ?For purposes of improving physical access to our facilities, [facility] is pleased to partner with third parties to provide [facility] patients or other authorized individuals the option of convenient, on-demand ground transportation services (the ?[employer]?) through use of the technology service that enables users to request on-demand ground transportation from independent third-party providers. ? ?By opting to use and accept these [employer], I, the undersigned, hereby agree on behalf of myself, and on behalf of any minor child using the Government social research officer for whom I am the parent or legal guardian, as follows: ? ?Government social research officer provided to me are provided by independent third-party transportation providers who are not [employer] or employees and who are unaffiliated with [employer]. ?[facility] is neither a transportation carrier nor a common or public carrier. ?[facility] has no control over the quality or safety of the transportation that occurs as a result of the [employer]. ?[facility] cannot guarantee that any third-party transportation provider will complete any arranged transportation service. ?[facility] makes no representation, warranty, or guarantee regarding the reliability, timeliness, quality, safety, suitability, or availability of any of the Transport Services or that they will be error free. ?I fully understand that traveling by vehicle involves risks and dangers of serious bodily injury, including permanent disability, paralysis, and death. I agree, on behalf of myself and on behalf of any minor child using the Transport Services for whom I am the parent or legal guardian, that the entire risk arising out of my use of the [employer] remains solely with me, to the maximum extent permitted under applicable law. ?The [employer] are  provided ?as is? and ?as available.? [facility] disclaims all representations and warranties, express, implied or statutory, not expressly set out in these terms, including the implied warranties of merchantability and fitness for a particular purpose. ?I hereby waive and release [facility], its agents, employees, officers, directors, representatives, insurers, attorneys, assigns, successors, subsidiaries, and affiliates from any and all past, present, or future claims, demands, liabilities, actions, causes of action, or suits of any kind directly or indirectly arising from acceptance and use of the [employer]. ?I further waive and release [facility] and its affiliates from all present and future liability and responsibility for any injury or death to persons or damages to property caused by or related to the use of the [employer]. ?I have read this Waiver and Release of Liability, and I understand the terms used in it and their legal significance. This Waiver is freely and voluntarily given with the understanding that my right (as well as the right of any minor child for whom I am the parent or legal guardian using the [employer]) to legal recourse against [facility] in connection with the [employer] is knowingly surrendered in return for use of these services. ? ? ?I attest that I read the consent document to [name], gave [name] the opportunity to ask questions and answered the questions asked (if any). I affirm that [name] then provided consent for she's participation in this program.   ?  ?Lacey Vega  ? ?                          ?

## 2021-09-20 NOTE — Telephone Encounter (Signed)
.  Called patient to schedule appointment per 4/21 inbasket, patient is aware of date and time.   ?

## 2021-09-23 ENCOUNTER — Other Ambulatory Visit: Payer: Self-pay

## 2021-09-23 ENCOUNTER — Inpatient Hospital Stay: Payer: Medicare HMO | Attending: Hematology and Oncology

## 2021-09-23 ENCOUNTER — Other Ambulatory Visit: Payer: Self-pay | Admitting: *Deleted

## 2021-09-23 DIAGNOSIS — D500 Iron deficiency anemia secondary to blood loss (chronic): Secondary | ICD-10-CM

## 2021-09-23 DIAGNOSIS — D573 Sickle-cell trait: Secondary | ICD-10-CM | POA: Insufficient documentation

## 2021-09-23 LAB — CMP (CANCER CENTER ONLY)
ALT: 10 U/L (ref 0–44)
AST: 18 U/L (ref 15–41)
Albumin: 4 g/dL (ref 3.5–5.0)
Alkaline Phosphatase: 96 U/L (ref 38–126)
Anion gap: 3 — ABNORMAL LOW (ref 5–15)
BUN: 22 mg/dL (ref 8–23)
CO2: 31 mmol/L (ref 22–32)
Calcium: 9.5 mg/dL (ref 8.9–10.3)
Chloride: 109 mmol/L (ref 98–111)
Creatinine: 1.03 mg/dL — ABNORMAL HIGH (ref 0.44–1.00)
GFR, Estimated: 58 mL/min — ABNORMAL LOW (ref >60)
Glucose, Bld: 101 mg/dL — ABNORMAL HIGH (ref 70–99)
Potassium: 4.4 mmol/L (ref 3.5–5.1)
Sodium: 143 mmol/L (ref 135–145)
Total Bilirubin: 0.5 mg/dL (ref 0.3–1.2)
Total Protein: 7.2 g/dL (ref 6.5–8.1)

## 2021-09-23 LAB — CBC WITH DIFFERENTIAL (CANCER CENTER ONLY)
Abs Immature Granulocytes: 0.01 10*3/uL (ref 0.00–0.07)
Basophils Absolute: 0 10*3/uL (ref 0.0–0.1)
Basophils Relative: 1 %
Eosinophils Absolute: 0.1 10*3/uL (ref 0.0–0.5)
Eosinophils Relative: 3 %
HCT: 25.1 % — ABNORMAL LOW (ref 36.0–46.0)
Hemoglobin: 7.4 g/dL — ABNORMAL LOW (ref 12.0–15.0)
Immature Granulocytes: 0 %
Lymphocytes Relative: 19 %
Lymphs Abs: 1 10*3/uL (ref 0.7–4.0)
MCH: 24.1 pg — ABNORMAL LOW (ref 26.0–34.0)
MCHC: 29.5 g/dL — ABNORMAL LOW (ref 30.0–36.0)
MCV: 81.8 fL (ref 80.0–100.0)
Monocytes Absolute: 0.5 10*3/uL (ref 0.1–1.0)
Monocytes Relative: 11 %
Neutro Abs: 3.3 10*3/uL (ref 1.7–7.7)
Neutrophils Relative %: 66 %
Platelet Count: 355 10*3/uL (ref 150–400)
RBC: 3.07 MIL/uL — ABNORMAL LOW (ref 3.87–5.11)
RDW: 15 % (ref 11.5–15.5)
WBC Count: 5 10*3/uL (ref 4.0–10.5)
nRBC: 0 % (ref 0.0–0.2)

## 2021-09-23 LAB — RETIC PANEL
Immature Retic Fract: 26.1 % — ABNORMAL HIGH (ref 2.3–15.9)
RBC.: 2.98 MIL/uL — ABNORMAL LOW (ref 3.87–5.11)
Retic Count, Absolute: 67.1 10*3/uL (ref 19.0–186.0)
Retic Ct Pct: 2.3 % (ref 0.4–3.1)
Reticulocyte Hemoglobin: 23.2 pg — ABNORMAL LOW (ref >27.9)

## 2021-09-23 LAB — IRON AND IRON BINDING CAPACITY (CC-WL,HP ONLY)
Iron: 280 ug/dL — ABNORMAL HIGH (ref 28–170)
Saturation Ratios: 63 % — ABNORMAL HIGH (ref 10.4–31.8)
TIBC: 442 ug/dL (ref 250–450)
UIBC: 162 ug/dL (ref 148–442)

## 2021-09-23 LAB — FERRITIN: Ferritin: 18 ng/mL (ref 11–307)

## 2021-09-23 LAB — SAMPLE TO BLOOD BANK

## 2021-09-23 LAB — PREPARE RBC (CROSSMATCH)

## 2021-09-24 ENCOUNTER — Inpatient Hospital Stay: Payer: Medicare HMO

## 2021-09-24 ENCOUNTER — Other Ambulatory Visit: Payer: Medicare HMO

## 2021-09-24 DIAGNOSIS — D500 Iron deficiency anemia secondary to blood loss (chronic): Secondary | ICD-10-CM

## 2021-09-24 MED ORDER — ACETAMINOPHEN 325 MG PO TABS
650.0000 mg | ORAL_TABLET | Freq: Once | ORAL | Status: AC
  Administered 2021-09-24: 650 mg via ORAL
  Filled 2021-09-24: qty 2

## 2021-09-24 MED ORDER — SODIUM CHLORIDE 0.9% IV SOLUTION: 250.0000 mL | Freq: Once | INTRAVENOUS | Status: DC

## 2021-09-24 MED ORDER — SODIUM CHLORIDE 0.9% FLUSH: 10.0000 mL | INTRAVENOUS | Status: DC | PRN

## 2021-09-25 LAB — BPAM RBC
Blood Product Expiration Date: 202305182359
ISSUE DATE / TIME: 202304250849
Unit Type and Rh: 6200

## 2021-09-25 LAB — TYPE AND SCREEN
ABO/RH(D): A POS
Antibody Screen: POSITIVE
Donor AG Type: NEGATIVE
Unit division: 0

## 2021-09-30 ENCOUNTER — Telehealth: Payer: Self-pay | Admitting: Hematology and Oncology

## 2021-09-30 NOTE — Telephone Encounter (Signed)
.  Called patient to schedule appointment per 5/1 inbasket, patient is aware of date and time.   ?

## 2021-10-01 ENCOUNTER — Inpatient Hospital Stay: Payer: Medicare HMO | Admitting: Hematology and Oncology

## 2021-10-01 ENCOUNTER — Inpatient Hospital Stay: Payer: Medicare HMO

## 2021-10-09 ENCOUNTER — Inpatient Hospital Stay: Payer: Medicare HMO | Admitting: Hematology and Oncology

## 2021-10-09 ENCOUNTER — Inpatient Hospital Stay: Payer: Medicare HMO

## 2021-10-09 ENCOUNTER — Other Ambulatory Visit: Payer: Medicare HMO

## 2021-10-09 ENCOUNTER — Ambulatory Visit: Payer: Medicare HMO | Admitting: Physician Assistant

## 2021-10-14 ENCOUNTER — Inpatient Hospital Stay: Payer: Medicare HMO

## 2021-10-14 ENCOUNTER — Inpatient Hospital Stay (HOSPITAL_BASED_OUTPATIENT_CLINIC_OR_DEPARTMENT_OTHER): Payer: Medicare HMO | Admitting: Hematology and Oncology

## 2021-10-14 ENCOUNTER — Inpatient Hospital Stay: Payer: Medicare HMO | Attending: Hematology and Oncology

## 2021-10-14 ENCOUNTER — Other Ambulatory Visit: Payer: Self-pay | Admitting: Hematology and Oncology

## 2021-10-14 ENCOUNTER — Other Ambulatory Visit: Payer: Self-pay

## 2021-10-14 ENCOUNTER — Telehealth: Payer: Self-pay | Admitting: Internal Medicine

## 2021-10-14 VITALS — BP 127/50 | HR 96 | Temp 97.3°F | Resp 17 | Wt 185.6 lb

## 2021-10-14 DIAGNOSIS — D500 Iron deficiency anemia secondary to blood loss (chronic): Secondary | ICD-10-CM | POA: Insufficient documentation

## 2021-10-14 DIAGNOSIS — Z809 Family history of malignant neoplasm, unspecified: Secondary | ICD-10-CM | POA: Insufficient documentation

## 2021-10-14 DIAGNOSIS — E119 Type 2 diabetes mellitus without complications: Secondary | ICD-10-CM | POA: Diagnosis not present

## 2021-10-14 DIAGNOSIS — D573 Sickle-cell trait: Secondary | ICD-10-CM | POA: Insufficient documentation

## 2021-10-14 DIAGNOSIS — Z87891 Personal history of nicotine dependence: Secondary | ICD-10-CM | POA: Insufficient documentation

## 2021-10-14 DIAGNOSIS — I10 Essential (primary) hypertension: Secondary | ICD-10-CM | POA: Diagnosis not present

## 2021-10-14 LAB — CBC WITH DIFFERENTIAL (CANCER CENTER ONLY)
Abs Immature Granulocytes: 0.02 10*3/uL (ref 0.00–0.07)
Basophils Absolute: 0 10*3/uL (ref 0.0–0.1)
Basophils Relative: 0 %
Eosinophils Absolute: 0.2 10*3/uL (ref 0.0–0.5)
Eosinophils Relative: 3 %
HCT: 19.6 % — ABNORMAL LOW (ref 36.0–46.0)
Hemoglobin: 5.9 g/dL — CL (ref 12.0–15.0)
Immature Granulocytes: 0 %
Lymphocytes Relative: 16 %
Lymphs Abs: 1 10*3/uL (ref 0.7–4.0)
MCH: 24.8 pg — ABNORMAL LOW (ref 26.0–34.0)
MCHC: 30.1 g/dL (ref 30.0–36.0)
MCV: 82.4 fL (ref 80.0–100.0)
Monocytes Absolute: 0.5 10*3/uL (ref 0.1–1.0)
Monocytes Relative: 8 %
Neutro Abs: 4.4 10*3/uL (ref 1.7–7.7)
Neutrophils Relative %: 73 %
Platelet Count: 345 10*3/uL (ref 150–400)
RBC: 2.38 MIL/uL — ABNORMAL LOW (ref 3.87–5.11)
RDW: 15.9 % — ABNORMAL HIGH (ref 11.5–15.5)
WBC Count: 6.2 10*3/uL (ref 4.0–10.5)
nRBC: 0 % (ref 0.0–0.2)

## 2021-10-14 LAB — IRON AND IRON BINDING CAPACITY (CC-WL,HP ONLY)
Iron: 20 ug/dL — ABNORMAL LOW (ref 28–170)
Saturation Ratios: 5 % — ABNORMAL LOW (ref 10.4–31.8)
TIBC: 413 ug/dL (ref 250–450)
UIBC: 393 ug/dL (ref 148–442)

## 2021-10-14 LAB — CMP (CANCER CENTER ONLY)
ALT: 10 U/L (ref 0–44)
AST: 16 U/L (ref 15–41)
Albumin: 3.6 g/dL (ref 3.5–5.0)
Alkaline Phosphatase: 72 U/L (ref 38–126)
Anion gap: 5 (ref 5–15)
BUN: 20 mg/dL (ref 8–23)
CO2: 30 mmol/L (ref 22–32)
Calcium: 9.2 mg/dL (ref 8.9–10.3)
Chloride: 108 mmol/L (ref 98–111)
Creatinine: 0.99 mg/dL (ref 0.44–1.00)
GFR, Estimated: >60 mL/min (ref >60)
Glucose, Bld: 113 mg/dL — ABNORMAL HIGH (ref 70–99)
Potassium: 3.9 mmol/L (ref 3.5–5.1)
Sodium: 143 mmol/L (ref 135–145)
Total Bilirubin: 0.5 mg/dL (ref 0.3–1.2)
Total Protein: 6.3 g/dL — ABNORMAL LOW (ref 6.5–8.1)

## 2021-10-14 LAB — SAMPLE TO BLOOD BANK

## 2021-10-14 LAB — FERRITIN: Ferritin: 20 ng/mL (ref 11–307)

## 2021-10-14 LAB — RETIC PANEL
Immature Retic Fract: 35.8 % — ABNORMAL HIGH (ref 2.3–15.9)
RBC.: 2.36 MIL/uL — ABNORMAL LOW (ref 3.87–5.11)
Retic Count, Absolute: 91.1 10*3/uL (ref 19.0–186.0)
Retic Ct Pct: 3.9 % — ABNORMAL HIGH (ref 0.4–3.1)
Reticulocyte Hemoglobin: 23 pg — ABNORMAL LOW (ref >27.9)

## 2021-10-14 LAB — PREPARE RBC (CROSSMATCH)

## 2021-10-14 MED ORDER — SODIUM CHLORIDE 0.9% IV SOLUTION
250.0000 mL | Freq: Once | INTRAVENOUS | Status: AC
  Administered 2021-10-14: 250 mL via INTRAVENOUS

## 2021-10-14 MED ORDER — ACETAMINOPHEN 325 MG PO TABS
650.0000 mg | ORAL_TABLET | Freq: Once | ORAL | Status: AC
  Administered 2021-10-14: 650 mg via ORAL
  Filled 2021-10-14: qty 2

## 2021-10-14 NOTE — Telephone Encounter (Signed)
Good Afternoon Dr. Lorenso Courier, ? ?Patient is wanting do to a second opinion with you due to being seen at [facility] and is continually requiring blood transfusions. I am aware that you are okay with scheduling patient, I just want to double check before I schedule. Is that okay? ? ? ?Please advise.  ?

## 2021-10-14 NOTE — Patient Instructions (Signed)
Blood Transfusion, Adult, Care After This sheet gives you information about how to care for yourself after your procedure. Your doctor may also give you more specific instructions. If you have problems or questions, contact your doctor. What can I expect after the procedure? After the procedure, it is common to have: Bruising and soreness at the IV site. A headache. Follow these instructions at home: Insertion site care     Follow instructions from your doctor about how to take care of your insertion site. This is where an IV tube was put into your vein. Make sure you: Wash your hands with soap and water before and after you change your bandage (dressing). If you cannot use soap and water, use hand sanitizer. Change your bandage as told by your doctor. Check your insertion site every day for signs of infection. Check for: Redness, swelling, or pain. Bleeding from the site. Warmth. Pus or a bad smell. General instructions Take over-the-counter and prescription medicines only as told by your doctor. Rest as told by your doctor. Go back to your normal activities as told by your doctor. Keep all follow-up visits as told by your doctor. This is important. Contact a doctor if: You have itching or red, swollen areas of skin (hives). You feel worried or nervous (anxious). You feel weak after doing your normal activities. You have redness, swelling, warmth, or pain around the insertion site. You have blood coming from the insertion site, and the blood does not stop with pressure. You have pus or a bad smell coming from the insertion site. Get help right away if: You have signs of a serious reaction. This may be coming from an allergy or the body's defense system (immune system). Signs include: Trouble breathing or shortness of breath. Swelling of the face or feeling warm (flushed). Fever or chills. Head, chest, or back pain. Dark pee (urine) or blood in the pee. Widespread rash. Fast  heartbeat. Feeling dizzy or light-headed. You may receive your blood transfusion in an outpatient setting. If so, you will be told whom to contact to report any reactions. These symptoms may be an emergency. Do not wait to see if the symptoms will go away. Get medical help right away. Call your local emergency services (911 in the [place] Do not drive yourself to the hospital. Summary Bruising and soreness at the IV site are common. Check your insertion site every day for signs of infection. Rest as told by your doctor. Go back to your normal activities as told by your doctor. Get help right away if you have signs of a serious reaction. This information is not intended to replace advice given to you by your health care provider. Make sure you discuss any questions you have with your health care provider. Document Revised: 09/13/2020 Document Reviewed: 11/11/2018 Elsevier Patient Education  2023 [employer]  

## 2021-10-14 NOTE — Progress Notes (Signed)
?[place] ?Telephone:(336) [phone]   [phone] ? ?PROGRESS NOTE ? ?Patient Care Team: ?Rogers Blocker, MD as PCP - General (Internal Medicine) ? ?Hematological/Oncological History ?# Iron Deficiency Anemia due to Chronic Blood Loss ?# Sickle Cell Trait  ?1) 03/26/2009: Hgb 7.2, first Hgb on record ?2) 09/02/2011: 2 units of PRBC at [facility] (Hgb noted at 8.9).  ?3)  12/03/2011: colonoscopy, found colon polyp and sigmoid diverticula. 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 Underwent argon plasma coagulation  ?4) Jan 2018: colonoscopy with 2 tubular adenomas removed. Small colonic AVM and small internal hemorrhoids.  ?5)10/24/2017: Hgb 7.7, MCV 71, Plt 338, and WBC 6.7. POEM procedure for Achalasia. duodenal AVM fulgurated.  ?6) 03/2018: Hgb 8.2.  ?7) March 2020: capsule endoscopy: nonbleeding gastric and proximal small bowel AVM,  ?8) 04/26/2019: Establish Care with Dr. Lorenso Courier  WBC 6.1, Hgb 9.3, MCV 75.3, Plt 289 ?9) 05/17/2019: Underwent EGD which showed esophagitis and small non-bleeding duodenal angiodysplasias. ?10) 07/27/2019: WBC 5.9, Hgb 9.8, MCV 77.8, Plt 271. Recommend IV iron based on labs. Received feraheme '510mg'$  IV q7 days x 2 doses on 3/8 and 08/15/2019. Hgb electrophoresis confirms sickle cell trait.  ?11) 10/24/2019: WBC 5.4, Hgb 10.2, MCV 80.5, Plt 252 ?12) 02/15/2020: WBC 5.1, Hgb 9.7, MCV 80.2, Plt 280 ?13) 9/24-10/06/2019: IV feraheme '510mg'$  x 2 doses ?14) 04/22/2021: Patient presented to emergency department with heavy nosebleeding.  Had a syncopal episode in the emergency department.  Discharged on 04/24/2021. Hgb 7.2 on admission.  ?15) 07/05/2021: presented to clinic with Hgb 6.2, MCV 88.9 and Plt 286. Admitted for GI workup.  ? ?Interval History:  ?Caleigha Zale 71 y.o. female with medical history significant for iron deficiency anemia 2/2 to chronic blood loss/ sickle cell trait who presents for a follow up visit. The patient's last visit was on 07/05/2021.  In the interim since the last visit [name] continues to require blood transfusions due to Hgb <7.0.   ? ?On exam today Mrs. Fulgham notes she feels "not good".  She notes that she continually feels cold and has issues with shortness of breath.  Fortunately she is not having any lightheadedness or dizziness.  She reports that she is "not sturdy" anymore.  She reports that she feels unsteady on her feet and sometimes wobbly.  She notes that she is not seeing any overt signs of bleeding such as gum bleeding or nosebleeds.  She notes that her stools can be "weird" and sometimes they are "crumbly or sticky and tarry".  She reports she is not having any pain with bowel movements.  She reports that she is frustrated with the inability to find the source of her GI bleeding.  She is requesting referral to [facility] at this time.  She has had no issues with fevers, chills, sweats, nausea, vomiting or diarrhea.  A full 10 point ROS is listed below. ? ?Due to concern her hemoglobin of less than 6 she is given 2 units of packed red blood cells in clinic today. ? ?MEDICAL HISTORY:  ?Past Medical History:  ?Diagnosis Date  ? Anemia   ? Anxiety   ? Asthma   ? Asthma   ? Bronchitis   ? hx  ? Colon polyps   ? hyperplastic  ? Diabetes ([place])   ? mild  ? Diabetes mellitus   ? DJD (degenerative joint disease), cervical   ? Dysphagia   ? history of  ? Dyspnea   ?  Gallstones   ? GERD (gastroesophageal reflux disease)   ? H/O chest pain   ? secondary to anemia  ? Headache   ? History of arteriovenous malformation (AVM)   ? History of hiatal hernia   ? History of tobacco use   ? Hyperlipemia   ? Hypertension   ? Hypothyroidism   ? Iron deficiency anemia   ? Sickle cell anemia (HCC)   ? "patient is not aware of this"  ? Thyroid disease   ? TIA (transient ischemic attack)   ? "was told that she possible had one"  ? ? ?SURGICAL HISTORY: ?Past Surgical History:  ?Procedure Laterality Date  ? ABDOMINAL AORTIC ENDOVASCULAR STENT GRAFT Bilateral  01/31/2021  ? Procedure: ENDOVASCULAR ANEURYSM REPAIR (EVAR)Bilateral Groin Cutdown, left femoral endaterectomy with bovine patch angioplasty.;  Surgeon: Cherre Robins, MD;  Location: [facility] OR;  Service: Vascular;  Laterality: Bilateral;  ? ABDOMINAL HYSTERECTOMY    ? ANTERIOR CERVICAL DECOMP/DISCECTOMY FUSION N/A 10/03/2016  ? Procedure: ANTERIOR CERVICAL DECOMPRESSION/DISCECTOMY FUSION CERVICAL THREE- CERVICAL FOUR;  Surgeon: Ashok Pall, MD;  Location: [place];  Service: Neurosurgery;  Laterality: N/A;  Right side approach  ? BOTOX INJECTION  08/29/2015  ? Procedure: BOTOX INJECTION;  Surgeon: Wilford Corner, MD;  Location: [facility] ENDOSCOPY;  Service: Endoscopy;;  ? CHOLECYSTECTOMY    ? COLONOSCOPY  12/03/2011  ? Procedure: COLONOSCOPY;  Surgeon: [name], MD;  Location: [facility];  Service: Endoscopy;  Laterality: N/A;  ? ENTEROSCOPY N/A 07/07/2021  ? Procedure: ENTEROSCOPY;  Surgeon: Clarene Essex, MD;  Location: [facility] ENDOSCOPY;  Service: Endoscopy;  Laterality: N/A;  ? ENTEROSCOPY N/A 08/06/2021  ? Procedure: ENTEROSCOPY;  Surgeon: Ronnette Juniper, MD;  Location: [facility] ENDOSCOPY;  Service: Gastroenterology;  Laterality: N/A;  ? ESOPHAGEAL MANOMETRY N/A 07/13/2017  ? Procedure: ESOPHAGEAL MANOMETRY (EM);  Surgeon: Wilford Corner, MD;  Location: [facility] ENDOSCOPY;  Service: Endoscopy;  Laterality: N/A;  ? ESOPHAGOGASTRODUODENOSCOPY  12/03/2011  ? Procedure: ESOPHAGOGASTRODUODENOSCOPY (EGD);  Surgeon: [name], MD;  Location: [facility] ENDOSCOPY;  Service: Endoscopy;  Laterality: N/A;  ? ESOPHAGOGASTRODUODENOSCOPY (EGD) WITH PROPOFOL N/A 08/29/2015  ? Procedure: ESOPHAGOGASTRODUODENOSCOPY (EGD) WITH PROPOFOL;  Surgeon: Wilford Corner, MD;  Location: [facility] ENDOSCOPY;  Service: Endoscopy;  Laterality: N/A;  ? ESOPHAGOGASTRODUODENOSCOPY (EGD) WITH PROPOFOL N/A 05/17/2019  ? Procedure: ESOPHAGOGASTRODUODENOSCOPY (EGD) WITH PROPOFOL;  Surgeon: Wilford Corner, MD;  Location: [facility] ENDOSCOPY;  Service: Endoscopy;  Laterality: N/A;   ? ESOPHAGOGASTRODUODENOSCOPY (EGD) WITH PROPOFOL N/A 04/24/2021  ? Procedure: ESOPHAGOGASTRODUODENOSCOPY (EGD) WITH PROPOFOL;  Surgeon: Otis Brace, MD;  Location: [facility] ENDOSCOPY;  Service: Gastroenterology;  Laterality: N/A;  ? GIVENS CAPSULE STUDY N/A 08/05/2018  ? Procedure: GIVENS CAPSULE STUDY;  Surgeon: Wilford Corner, MD;  Location: [facility] ENDOSCOPY;  Service: Endoscopy;  Laterality: N/A;  ? GIVENS CAPSULE STUDY N/A 07/06/2021  ? Procedure: GIVENS CAPSULE STUDY;  Surgeon: Clarene Essex, MD;  Location: [facility] ENDOSCOPY;  Service: Endoscopy;  Laterality: N/A;  ? HOT HEMOSTASIS  12/03/2011  ? Procedure: HOT HEMOSTASIS (ARGON PLASMA COAGULATION/BICAP);  Surgeon: [name], MD;  Location: [facility] ENDOSCOPY;  Service: Endoscopy;  Laterality: N/A;  ? HOT HEMOSTASIS N/A 07/07/2021  ? Procedure: HOT HEMOSTASIS (ARGON PLASMA COAGULATION/BICAP);  Surgeon: Clarene Essex, MD;  Location: [facility] ENDOSCOPY;  Service: Endoscopy;  Laterality: N/A;  ? HOT HEMOSTASIS N/A 08/06/2021  ? Procedure: HOT HEMOSTASIS (ARGON PLASMA COAGULATION/BICAP);  Surgeon: Ronnette Juniper, MD;  Location: [facility] ENDOSCOPY;  Service: Gastroenterology;  Laterality: N/A;  ? NECK SURGERY    ? PERIPHERAL VASCULAR INTERVENTION  03/29/2021  ?  Procedure: PERIPHERAL VASCULAR INTERVENTION;  Surgeon: Cherre Robins, MD;  Location: [place] [facility] LAB;  Service: Cardiovascular;;  Lt SFA Brachial Approach  ? ROTATOR CUFF REPAIR    ? right  ? ROTATOR CUFF REPAIR Right 2014  ? THROMBECTOMY FEMORAL ARTERY Left 01/31/2021  ? Procedure: THROMBECTOMY POPLITEAL  ARTERY;  Surgeon: Cherre Robins, MD;  Location: [place] OR;  Service: Vascular;  Laterality: Left;  ? TONSILLECTOMY    ? ? ?SOCIAL HISTORY: ?Social History  ? ?Socioeconomic History  ? Marital status: Divorced  ?  Spouse name: Not on file  ? Number of children: 2  ? Years of education: Not on file  ? Highest education level: Not on file  ?Occupational History  ? Occupation: group leader environmental services  ?  Employer: [employer]  ?  Comment: [employer]  ?Tobacco Use  ? Smoking status: Former  ?  Types: Cigarettes  ?  Quit date: 06/02/2000  ?  Years since quitting: 21.3  ? Smokeless tobacco: Never  ?Vaping Use  ?

## 2021-10-15 ENCOUNTER — Encounter: Payer: Self-pay | Admitting: Hematology and Oncology

## 2021-10-15 LAB — TYPE AND SCREEN
ABO/RH(D): A POS
Antibody Screen: POSITIVE
Donor AG Type: NEGATIVE
Donor AG Type: NEGATIVE
Unit division: 0
Unit division: 0

## 2021-10-15 LAB — BPAM RBC
Blood Product Expiration Date: 202306042359
Blood Product Expiration Date: 202306082359
ISSUE DATE / TIME: 202305151229
ISSUE DATE / TIME: 202305151229
Unit Type and Rh: 6200
Unit Type and Rh: 6200

## 2021-10-15 NOTE — Telephone Encounter (Signed)
LVM for patient to call back to schedule

## 2021-10-16 ENCOUNTER — Telehealth: Payer: Self-pay | Admitting: Hematology and Oncology

## 2021-10-16 NOTE — Telephone Encounter (Signed)
Scheduled per 5/15 los, pt has been called and confirmed  ?

## 2021-10-22 ENCOUNTER — Other Ambulatory Visit: Payer: Self-pay | Admitting: Hematology and Oncology

## 2021-10-22 ENCOUNTER — Other Ambulatory Visit: Payer: Self-pay

## 2021-10-22 ENCOUNTER — Other Ambulatory Visit: Payer: Self-pay | Admitting: *Deleted

## 2021-10-22 ENCOUNTER — Inpatient Hospital Stay: Payer: Medicare HMO

## 2021-10-22 DIAGNOSIS — D500 Iron deficiency anemia secondary to blood loss (chronic): Secondary | ICD-10-CM

## 2021-10-22 LAB — CBC WITH DIFFERENTIAL (CANCER CENTER ONLY)
Abs Immature Granulocytes: 0.01 10*3/uL (ref 0.00–0.07)
Basophils Absolute: 0 10*3/uL (ref 0.0–0.1)
Basophils Relative: 0 %
Eosinophils Absolute: 0.2 10*3/uL (ref 0.0–0.5)
Eosinophils Relative: 2 %
HCT: 27.2 % — ABNORMAL LOW (ref 36.0–46.0)
Hemoglobin: 8.4 g/dL — ABNORMAL LOW (ref 12.0–15.0)
Immature Granulocytes: 0 %
Lymphocytes Relative: 15 %
Lymphs Abs: 1 10*3/uL (ref 0.7–4.0)
MCH: 26 pg (ref 26.0–34.0)
MCHC: 30.9 g/dL (ref 30.0–36.0)
MCV: 84.2 fL (ref 80.0–100.0)
Monocytes Absolute: 0.6 10*3/uL (ref 0.1–1.0)
Monocytes Relative: 9 %
Neutro Abs: 4.6 10*3/uL (ref 1.7–7.7)
Neutrophils Relative %: 74 %
Platelet Count: 306 10*3/uL (ref 150–400)
RBC: 3.23 MIL/uL — ABNORMAL LOW (ref 3.87–5.11)
RDW: 15.9 % — ABNORMAL HIGH (ref 11.5–15.5)
WBC Count: 6.3 10*3/uL (ref 4.0–10.5)
nRBC: 0 % (ref 0.0–0.2)

## 2021-10-22 LAB — SAMPLE TO BLOOD BANK

## 2021-10-29 ENCOUNTER — Inpatient Hospital Stay: Payer: Medicare HMO

## 2021-10-29 ENCOUNTER — Other Ambulatory Visit: Payer: Self-pay

## 2021-10-29 ENCOUNTER — Other Ambulatory Visit: Payer: Self-pay | Admitting: Hematology and Oncology

## 2021-10-29 VITALS — BP 108/55 | HR 77 | Temp 98.1°F | Resp 17

## 2021-10-29 DIAGNOSIS — D500 Iron deficiency anemia secondary to blood loss (chronic): Secondary | ICD-10-CM

## 2021-10-29 LAB — CMP (CANCER CENTER ONLY)
ALT: 14 U/L (ref 0–44)
AST: 20 U/L (ref 15–41)
Albumin: 3.8 g/dL (ref 3.5–5.0)
Alkaline Phosphatase: 105 U/L (ref 38–126)
Anion gap: 6 (ref 5–15)
BUN: 18 mg/dL (ref 8–23)
CO2: 31 mmol/L (ref 22–32)
Calcium: 9.3 mg/dL (ref 8.9–10.3)
Chloride: 105 mmol/L (ref 98–111)
Creatinine: 1.12 mg/dL — ABNORMAL HIGH (ref 0.44–1.00)
GFR, Estimated: 53 mL/min — ABNORMAL LOW (ref >60)
Glucose, Bld: 117 mg/dL — ABNORMAL HIGH (ref 70–99)
Potassium: 3.9 mmol/L (ref 3.5–5.1)
Sodium: 142 mmol/L (ref 135–145)
Total Bilirubin: 0.7 mg/dL (ref 0.3–1.2)
Total Protein: 6.7 g/dL (ref 6.5–8.1)

## 2021-10-29 LAB — CBC WITH DIFFERENTIAL (CANCER CENTER ONLY)
Abs Immature Granulocytes: 0.01 10*3/uL (ref 0.00–0.07)
Basophils Absolute: 0 10*3/uL (ref 0.0–0.1)
Basophils Relative: 1 %
Eosinophils Absolute: 0.2 10*3/uL (ref 0.0–0.5)
Eosinophils Relative: 3 %
HCT: 25.4 % — ABNORMAL LOW (ref 36.0–46.0)
Hemoglobin: 7.8 g/dL — ABNORMAL LOW (ref 12.0–15.0)
Immature Granulocytes: 0 %
Lymphocytes Relative: 16 %
Lymphs Abs: 0.9 10*3/uL (ref 0.7–4.0)
MCH: 25.6 pg — ABNORMAL LOW (ref 26.0–34.0)
MCHC: 30.7 g/dL (ref 30.0–36.0)
MCV: 83.3 fL (ref 80.0–100.0)
Monocytes Absolute: 0.5 10*3/uL (ref 0.1–1.0)
Monocytes Relative: 9 %
Neutro Abs: 4.1 10*3/uL (ref 1.7–7.7)
Neutrophils Relative %: 71 %
Platelet Count: 331 10*3/uL (ref 150–400)
RBC: 3.05 MIL/uL — ABNORMAL LOW (ref 3.87–5.11)
RDW: 15.2 % (ref 11.5–15.5)
WBC Count: 5.7 10*3/uL (ref 4.0–10.5)
nRBC: 0 % (ref 0.0–0.2)

## 2021-10-29 LAB — SAMPLE TO BLOOD BANK

## 2021-10-29 MED ORDER — SODIUM CHLORIDE 0.9 % IV SOLN: Freq: Once | INTRAVENOUS | Status: AC

## 2021-10-29 MED ORDER — SODIUM CHLORIDE 0.9 % IV SOLN
200.0000 mg | Freq: Once | INTRAVENOUS | Status: AC
  Administered 2021-10-29: 200 mg via INTRAVENOUS
  Filled 2021-10-29: qty 200

## 2021-10-29 NOTE — Progress Notes (Signed)
Patient declined to stay for 30 minute post observation period. VSS, ambulatory to lobby without incident.

## 2021-10-29 NOTE — Patient Instructions (Addendum)

## 2021-10-31 DIAGNOSIS — Z9289 Personal history of other medical treatment: Secondary | ICD-10-CM

## 2021-10-31 HISTORY — DX: Personal history of other medical treatment: Z92.89

## 2021-11-05 ENCOUNTER — Telehealth: Payer: Self-pay

## 2021-11-05 ENCOUNTER — Encounter (HOSPITAL_COMMUNITY): Payer: Self-pay

## 2021-11-05 ENCOUNTER — Other Ambulatory Visit: Payer: Self-pay

## 2021-11-05 ENCOUNTER — Other Ambulatory Visit: Payer: Self-pay | Admitting: Hematology and Oncology

## 2021-11-05 ENCOUNTER — Inpatient Hospital Stay: Payer: Medicare HMO

## 2021-11-05 ENCOUNTER — Inpatient Hospital Stay: Payer: Medicare HMO | Attending: Hematology and Oncology

## 2021-11-05 ENCOUNTER — Emergency Department (HOSPITAL_COMMUNITY)
Admission: EM | Admit: 2021-11-05 | Discharge: 2021-11-05 | Disposition: A | Discharge status: Home / Self Care | Payer: Medicare HMO | Attending: Emergency Medicine | Admitting: Emergency Medicine

## 2021-11-05 VITALS — BP 113/50 | HR 95 | Temp 98.6°F | Resp 17

## 2021-11-05 DIAGNOSIS — N189 Chronic kidney disease, unspecified: Secondary | ICD-10-CM | POA: Diagnosis not present

## 2021-11-05 DIAGNOSIS — M7989 Other specified soft tissue disorders: Secondary | ICD-10-CM | POA: Diagnosis not present

## 2021-11-05 DIAGNOSIS — D500 Iron deficiency anemia secondary to blood loss (chronic): Secondary | ICD-10-CM

## 2021-11-05 DIAGNOSIS — D649 Anemia, unspecified: Secondary | ICD-10-CM | POA: Diagnosis not present

## 2021-11-05 DIAGNOSIS — T801XXA Vascular complications following infusion, transfusion and therapeutic injection, initial encounter: Secondary | ICD-10-CM | POA: Diagnosis present

## 2021-11-05 DIAGNOSIS — Z7982 Long term (current) use of aspirin: Secondary | ICD-10-CM | POA: Insufficient documentation

## 2021-11-05 DIAGNOSIS — J45909 Unspecified asthma, uncomplicated: Secondary | ICD-10-CM | POA: Insufficient documentation

## 2021-11-05 DIAGNOSIS — J449 Chronic obstructive pulmonary disease, unspecified: Secondary | ICD-10-CM | POA: Insufficient documentation

## 2021-11-05 DIAGNOSIS — D573 Sickle-cell trait: Secondary | ICD-10-CM | POA: Insufficient documentation

## 2021-11-05 LAB — CBC WITH DIFFERENTIAL/PLATELET
Abs Immature Granulocytes: 0.01 10*3/uL (ref 0.00–0.07)
Basophils Absolute: 0 10*3/uL (ref 0.0–0.1)
Basophils Relative: 1 %
Eosinophils Absolute: 0.1 10*3/uL (ref 0.0–0.5)
Eosinophils Relative: 2 %
HCT: 20.8 % — ABNORMAL LOW (ref 36.0–46.0)
Hemoglobin: 6.2 g/dL — CL (ref 12.0–15.0)
Immature Granulocytes: 0 %
Lymphocytes Relative: 15 %
Lymphs Abs: 1.1 10*3/uL (ref 0.7–4.0)
MCH: 26.3 pg (ref 26.0–34.0)
MCHC: 29.8 g/dL — ABNORMAL LOW (ref 30.0–36.0)
MCV: 88.1 fL (ref 80.0–100.0)
Monocytes Absolute: 0.7 10*3/uL (ref 0.1–1.0)
Monocytes Relative: 9 %
Neutro Abs: 5.5 10*3/uL (ref 1.7–7.7)
Neutrophils Relative %: 73 %
Platelets: 319 10*3/uL (ref 150–400)
RBC: 2.36 MIL/uL — ABNORMAL LOW (ref 3.87–5.11)
RDW: 16.8 % — ABNORMAL HIGH (ref 11.5–15.5)
WBC: 7.5 10*3/uL (ref 4.0–10.5)
nRBC: 0 % (ref 0.0–0.2)

## 2021-11-05 LAB — CMP (CANCER CENTER ONLY)
ALT: 13 U/L (ref 0–44)
AST: 21 U/L (ref 15–41)
Albumin: 3.7 g/dL (ref 3.5–5.0)
Alkaline Phosphatase: 82 U/L (ref 38–126)
Anion gap: 8 (ref 5–15)
BUN: 27 mg/dL — ABNORMAL HIGH (ref 8–23)
CO2: 29 mmol/L (ref 22–32)
Calcium: 9 mg/dL (ref 8.9–10.3)
Chloride: 105 mmol/L (ref 98–111)
Creatinine: 1.18 mg/dL — ABNORMAL HIGH (ref 0.44–1.00)
GFR, Estimated: 49 mL/min — ABNORMAL LOW (ref >60)
Glucose, Bld: 118 mg/dL — ABNORMAL HIGH (ref 70–99)
Potassium: 3.4 mmol/L — ABNORMAL LOW (ref 3.5–5.1)
Sodium: 142 mmol/L (ref 135–145)
Total Bilirubin: 0.6 mg/dL (ref 0.3–1.2)
Total Protein: 6.4 g/dL — ABNORMAL LOW (ref 6.5–8.1)

## 2021-11-05 LAB — BASIC METABOLIC PANEL
Anion gap: 8 (ref 5–15)
BUN: 25 mg/dL — ABNORMAL HIGH (ref 8–23)
CO2: 24 mmol/L (ref 22–32)
Calcium: 8.5 mg/dL — ABNORMAL LOW (ref 8.9–10.3)
Chloride: 109 mmol/L (ref 98–111)
Creatinine, Ser: 1.26 mg/dL — ABNORMAL HIGH (ref 0.44–1.00)
GFR, Estimated: 46 mL/min — ABNORMAL LOW (ref >60)
Glucose, Bld: 105 mg/dL — ABNORMAL HIGH (ref 70–99)
Potassium: 3.5 mmol/L (ref 3.5–5.1)
Sodium: 141 mmol/L (ref 135–145)

## 2021-11-05 LAB — CBC WITH DIFFERENTIAL (CANCER CENTER ONLY)
Abs Immature Granulocytes: 0.02 10*3/uL (ref 0.00–0.07)
Basophils Absolute: 0 10*3/uL (ref 0.0–0.1)
Basophils Relative: 0 %
Eosinophils Absolute: 0.2 10*3/uL (ref 0.0–0.5)
Eosinophils Relative: 2 %
HCT: 18.7 % — ABNORMAL LOW (ref 36.0–46.0)
Hemoglobin: 5.8 g/dL — CL (ref 12.0–15.0)
Immature Granulocytes: 0 %
Lymphocytes Relative: 18 %
Lymphs Abs: 1.3 10*3/uL (ref 0.7–4.0)
MCH: 25.8 pg — ABNORMAL LOW (ref 26.0–34.0)
MCHC: 31 g/dL (ref 30.0–36.0)
MCV: 83.1 fL (ref 80.0–100.0)
Monocytes Absolute: 0.7 10*3/uL (ref 0.1–1.0)
Monocytes Relative: 9 %
Neutro Abs: 5.1 10*3/uL (ref 1.7–7.7)
Neutrophils Relative %: 71 %
Platelet Count: 324 10*3/uL (ref 150–400)
RBC: 2.25 MIL/uL — ABNORMAL LOW (ref 3.87–5.11)
RDW: 16.3 % — ABNORMAL HIGH (ref 11.5–15.5)
WBC Count: 7.2 10*3/uL (ref 4.0–10.5)
nRBC: 0 % (ref 0.0–0.2)

## 2021-11-05 LAB — SAMPLE TO BLOOD BANK

## 2021-11-05 LAB — PREPARE RBC (CROSSMATCH)

## 2021-11-05 MED ORDER — SODIUM CHLORIDE 0.9 % IV SOLN: Freq: Once | INTRAVENOUS | Status: AC

## 2021-11-05 MED ORDER — SODIUM CHLORIDE 0.9 % IV SOLN
200.0000 mg | Freq: Once | INTRAVENOUS | Status: AC
  Administered 2021-11-05: 200 mg via INTRAVENOUS
  Filled 2021-11-05: qty 200

## 2021-11-05 NOTE — Discharge Instructions (Signed)
You were seen in the ER for arm swelling. As discussed, most likely the swelling is because of some bleeding or there could be some iron that infiltrated in that area.  The treatment will be alternating between ice and heat for the next 2 to 3 days over the affected area.  Return to the ER if you start having excruciating pain over your arm, if the swelling starts moving towards your shoulder, the overlying skin becomes black, and starts draining fluid or skin starts blistering.  Please have the oncology nursing staff and physician also assess your arm tomorrow when you go for your blood transfusion.  Your hemoglobin is 6.2.  Make sure you get your transfusion tomorrow.

## 2021-11-05 NOTE — Patient Instructions (Signed)

## 2021-11-05 NOTE — Telephone Encounter (Signed)
CRITICAL VALUE STICKER  CRITICAL VALUE: HGB 5.8 g/dL  RECEIVER (on-site recipient of call): Charese Abundis RN  [facility] NOTIFIED: [id number]  MESSENGER (representative from lab): Rosann Auerbach MD NOTIFIED:  Dr. Lorenso Courier  TIME OF NOTIFICATION: [id number]  RESPONSE:  Transfuse 2 units of blood

## 2021-11-05 NOTE — ED Triage Notes (Signed)
Pt states that she was at cancer center earlier today for an iron infusion and had an IV in her L arm that possibly infiltrated. Noticeable swelling and bruising to the area.

## 2021-11-05 NOTE — ED Provider Notes (Signed)
[place] DEPT Provider Note   CSN: [id number] Arrival date & time: 11/05/21  1716     History  Chief Complaint  Patient presents with   Arm Swelling    Lacey Vega is a 71 y.o. female.  HPI     71 year old female comes in with chief complaint of left upper extremity swelling. Has history of asthma/COPD and iron deficiency anemia for which she requires frequent transfusion.  She also has history of CKD, PAD.  She indicates that she had gone for her routine IV iron infusion earlier today.  Towards the end, she was having some pain over her arm.  When the nurse pulled out the IV, she had increased pain at the site of the infusion.  Subsequently, she has had increased swelling to her left upper extremity.  She is having associated pain.  Patient is not on any blood thinners.   Home Medications Prior to Admission medications   Medication Sig Start Date End Date Taking? Authorizing Provider  acetaminophen (TYLENOL) 500 MG tablet Take 500-1,000 mg by mouth every 6 (six) hours as needed for mild pain or headache.    [provider]  albuterol (VENTOLIN HFA) 108 (90 Base) MCG/ACT inhaler Inhale 1-2 puffs into the lungs every 6 (six) hours as needed for wheezing or shortness of breath.    [provider]  aspirin EC 81 MG tablet Take 1 tablet (81 mg total) by mouth daily. **Start on 11/25** Patient taking differently: Take 81 mg by mouth daily. 04/26/21   Dessa Phi, DO  atorvastatin (LIPITOR) 40 MG tablet Take 40 mg by mouth at bedtime.    [provider]  carisoprodol (SOMA) 250 MG tablet Take 250 mg by mouth 2 (two) times daily as needed (cramps).  05/04/19   [provider]  Cinnamon 500 MG TABS Take 1,000 mg by mouth every evening.    [provider]  clopidogrel (PLAVIX) 75 MG tablet Take 1 tablet (75 mg total) by mouth daily. **Start on 11/25** Patient taking differently: Take 75 mg by mouth  daily. 04/26/21 04/26/22  Dessa Phi, DO  donepezil (ARICEPT) 5 MG tablet Take 5 mg by mouth at bedtime.    [provider]  ferrous sulfate 325 (65 FE) MG tablet Take 1 tablet (325 mg total) by mouth daily with breakfast. with orange juice 09/20/21   Orson Slick, MD  folic acid (FOLVITE) 314 MCG tablet Take 800 mcg by mouth daily.    [provider]  furosemide (LASIX) 20 MG tablet Take 20 mg by mouth daily.     [provider]  levothyroxine (SYNTHROID) 100 MCG tablet Take 100 mcg by mouth daily. 07/31/21   [provider]  Magnesium Oxide 420 (252 Mg) MG TABS Take 420-840 mg by mouth as needed (for cramps).    [provider]  montelukast (SINGULAIR) 10 MG tablet Take 10 mg by mouth every evening.    [provider]  Multiple Vitamins-Minerals (HAIR/SKIN/NAILS) CAPS Take 5,000 mcg by mouth daily.    [provider]  nitroGLYCERIN (NITROSTAT) 0.4 MG SL tablet Place 0.4 mg under the tongue every 5 (five) minutes as needed for chest pain.    [provider]  Omega-3 Fatty Acids (FISH OIL BURP-LESS) 1200 MG CAPS Take 1,000 mg by mouth every evening.    [provider]  omeprazole (PRILOSEC) 40 MG capsule Take 40 mg by mouth daily.    [provider]  pantoprazole (  PROTONIX) 40 MG tablet 40 mg twice a day for 4 weeks followed by Protonix 40 mg once a day. Patient taking differently: Take 40 mg by mouth daily before breakfast. 04/24/21   Dessa Phi, DO  vitamin B-12 (CYANOCOBALAMIN) 1000 MCG tablet Take 1,000 mcg by mouth daily.    [provider]      Allergies    Oxycodone    Review of Systems   Review of Systems  All other systems reviewed and are negative.  Physical Exam Updated Vital Signs BP 122/70 (BP Location: Right Arm)   Pulse 60   Temp 98.2 F (36.8 C) (Oral)   Resp 18   SpO2 100%  Physical Exam Vitals and nursing note reviewed.  Constitutional:      Appearance: She  is well-developed.  HENT:     Head: Atraumatic.  Cardiovascular:     Rate and Rhythm: Normal rate.  Pulmonary:     Effort: Pulmonary effort is normal.  Musculoskeletal:        General: Swelling and tenderness present. No deformity.     Cervical back: Normal range of motion and neck supple.     Comments: Compartments are soft, no crepitus  Skin:    General: Skin is warm and dry.     Findings: Bruising and rash present.     Comments: Hyperpigmented skin over the left [facility]  Neurological:     Mental Status: She is alert and oriented to person, place, and time.     ED Results / Procedures / Treatments   Labs (all labs ordered are listed, but only abnormal results are displayed) Labs Reviewed  CBC WITH DIFFERENTIAL/PLATELET - Abnormal; Notable for the following components:      Result Value   RBC 2.36 (*)    Hemoglobin 6.2 (*)    HCT 20.8 (*)    MCHC 29.8 (*)    RDW 16.8 (*)    All other components within normal limits  BASIC METABOLIC PANEL - Abnormal; Notable for the following components:   Glucose, Bld 105 (*)    BUN 25 (*)    Creatinine, Ser 1.26 (*)    Calcium 8.5 (*)    GFR, Estimated 46 (*)    All other components within normal limits  TYPE AND SCREEN    EKG None  Radiology No results found.  Procedures Ultrasound ED Soft Tissue  Date/Time: 11/05/2021 8:54 PM Performed by: Varney Biles, MD Authorized by: Varney Biles, MD   Procedure details:    Indications: limb pain     Transverse view:  Visualized   Longitudinal view:  Visualized   Images: archived   Location:    Location: upper extremity     Side:  Left Findings:     no abscess present    no foreign body present Comments:     Patient is clear evidence of fluid collection in the subcutaneous tissue and subcutaneous fat.  No gas appreciated    Medications Ordered in ED Medications - No data to display  ED Course/ Medical Decision Making/ A&P Clinical Course as of 11/05/21 2059  Tue Nov 05, 2021  2057 Hemoglobin(!!): 6.2 Hemoglobin of 6.2 was discussed with the patient.  Along with CBC and the anemia, metabolic profile was also reviewed and interpreted independently  Patient is supposed to get blood transfusion tomorrow. She is not feeling any different.  She would not come to the ER if it were not for the arm swelling.  Patient in agreement  to get her blood transfused tomorrow.  I do not see any reason to emergently transfuse the patient in the ED [AN]    Clinical Course User Index [AN] Varney Biles, MD                           Medical Decision Making  71 year old female comes in with chief complaint of left upper extremity swelling.  She has history of chronic anemia requiring IV iron infusion and routine blood transfusion.  During her IV iron infusion today, she started having some pain over the left AC.  When the IV was removed, she had more pain.  Subsequently there has been increased swelling.  Differential diagnosis includes compartment syndrome, tissue necrosis, tissue edema from infiltration, subcutaneous gas, thrombophlebitis  On exam, patient does not have any evidence of compartment syndrome. Given the acute nature of the symptoms, doubt that this is thrombophlebitis.  Ultrasound ruled out gas.  Clear evidence of subcutaneous edema appreciated.  We will advised patient to treat with RICE and also alternating ice and heat compresses.  I did consult pharmacist.  We discussed patient's presenting symptoms and the possibility of IV iron infiltrating.  We asked him if there was any concerns for localized tissue necrosis.  The pharmacist was able to review the infused iron and indicated that there is no red flags suggesting concerning complications and there is no to dose.   This recommendation along with the ultrasound finding were discussed with the patient.  Stable for discharge from the perspective of the upper extremity swelling.   Of note, admission for  blood transfusion considered.  However patient has chronic anemia, she is not symptomatic from her severe anemia right now and she is due for transfusion tomorrow morning.  In our judgment, patient can wait till tomorrow.  Patient also in agreement with the plan.   Final Clinical Impression(s) / ED Diagnoses Final diagnoses:  Chronic anemia  IV infiltrate, initial encounter  Swelling of arm    Rx / DC Orders ED Discharge Orders     None         Varney Biles, MD 11/05/21 2059

## 2021-11-05 NOTE — ED Provider Triage Note (Signed)
Emergency Medicine Provider Triage Evaluation Note  Lacey Vega , Vega 71 y.o. female  was evaluated in triage.  Pt complains of LUE swelling. Seen at [facility] center today. Has had some fatigue. Noted Hgb 5 per patient. Received iron today, supposed to get blood transfusion tomorrow morning. Noted after transfusion today when they got home her LUE was swollen where the IV was placed. No redness, warmth. No CP, SOB  Review of Systems  Positive: LUE swelling at IV placement Negative:   Physical Exam  There were no vitals taken for this visit. Gen:   Awake, no distress   Resp:  Normal effort  MSK:   Moves extremities without difficulty. Mild swelling to LUE at [facility] Other:    Medical Decision Making  Medically screening exam initiated at 5:25 PM.  Appropriate orders placed.  Lacey Vega was informed that the remainder of the evaluation will be completed by another provider, this initial triage assessment does not replace that evaluation, and the importance of remaining in the ED until their evaluation is complete.  LUE swelling   Lacey Mick A, PA-C 11/05/21 1727

## 2021-11-05 NOTE — Telephone Encounter (Signed)
Patient has been scheduled for follow up with Dr. Lorenso Courier on 11/08/21 at 9:50 am. Patient is aware.

## 2021-11-05 NOTE — Telephone Encounter (Signed)
-----   Message from Sharyn Creamer, MD sent at 11/05/2021  2:06 PM EDT ----- Hi Ammie, please get this patient a clinic appointment for me at my next available opening.  Thanks, Lyndee Leo

## 2021-11-06 ENCOUNTER — Inpatient Hospital Stay: Payer: Medicare HMO

## 2021-11-06 DIAGNOSIS — D573 Sickle-cell trait: Secondary | ICD-10-CM | POA: Diagnosis not present

## 2021-11-06 DIAGNOSIS — D500 Iron deficiency anemia secondary to blood loss (chronic): Secondary | ICD-10-CM

## 2021-11-06 MED ORDER — ACETAMINOPHEN 325 MG PO TABS
650.0000 mg | ORAL_TABLET | Freq: Once | ORAL | Status: AC
  Administered 2021-11-06: 650 mg via ORAL
  Filled 2021-11-06: qty 2

## 2021-11-06 MED ORDER — SODIUM CHLORIDE 0.9% IV SOLUTION
250.0000 mL | Freq: Once | INTRAVENOUS | Status: AC
  Administered 2021-11-06: 250 mL via INTRAVENOUS

## 2021-11-06 NOTE — Patient Instructions (Signed)
Blood Transfusion, Adult, Care After This sheet gives you information about how to care for yourself after your procedure. Your doctor may also give you more specific instructions. If you have problems or questions, contact your doctor. What can I expect after the procedure? After the procedure, it is common to have: Bruising and soreness at the IV site. A headache. Follow these instructions at home: Insertion site care     Follow instructions from your doctor about how to take care of your insertion site. This is where an IV tube was put into your vein. Make sure you: Wash your hands with soap and water before and after you change your bandage (dressing). If you cannot use soap and water, use hand sanitizer. Change your bandage as told by your doctor. Check your insertion site every day for signs of infection. Check for: Redness, swelling, or pain. Bleeding from the site. Warmth. Pus or a bad smell. General instructions Take over-the-counter and prescription medicines only as told by your doctor. Rest as told by your doctor. Go back to your normal activities as told by your doctor. Keep all follow-up visits as told by your doctor. This is important. Contact a doctor if: You have itching or red, swollen areas of skin (hives). You feel worried or nervous (anxious). You feel weak after doing your normal activities. You have redness, swelling, warmth, or pain around the insertion site. You have blood coming from the insertion site, and the blood does not stop with pressure. You have pus or a bad smell coming from the insertion site. Get help right away if: You have signs of a serious reaction. This may be coming from an allergy or the body's defense system (immune system). Signs include: Trouble breathing or shortness of breath. Swelling of the face or feeling warm (flushed). Fever or chills. Head, chest, or back pain. Dark pee (urine) or blood in the pee. Widespread rash. Fast  heartbeat. Feeling dizzy or light-headed. You may receive your blood transfusion in an outpatient setting. If so, you will be told whom to contact to report any reactions. These symptoms may be an emergency. Do not wait to see if the symptoms will go away. Get medical help right away. Call your local emergency services (911 in the [place] Do not drive yourself to the hospital. Summary Bruising and soreness at the IV site are common. Check your insertion site every day for signs of infection. Rest as told by your doctor. Go back to your normal activities as told by your doctor. Get help right away if you have signs of a serious reaction. This information is not intended to replace advice given to you by your health care provider. Make sure you discuss any questions you have with your health care provider. Document Revised: 09/13/2020 Document Reviewed: 11/11/2018 Elsevier Patient Education  2023 [employer]  

## 2021-11-07 LAB — BPAM RBC
Blood Product Expiration Date: 202306282359
Blood Product Expiration Date: 202307112359
ISSUE DATE / TIME: 202306070918
ISSUE DATE / TIME: 202306070918
Unit Type and Rh: 5100
Unit Type and Rh: 6200

## 2021-11-07 LAB — TYPE AND SCREEN
ABO/RH(D): A POS
Antibody Screen: NEGATIVE
Donor AG Type: NEGATIVE
Donor AG Type: NEGATIVE
Unit division: 0
Unit division: 0

## 2021-11-08 ENCOUNTER — Encounter: Payer: Self-pay | Admitting: Internal Medicine

## 2021-11-08 ENCOUNTER — Other Ambulatory Visit (INDEPENDENT_AMBULATORY_CARE_PROVIDER_SITE_OTHER): Payer: Medicare HMO

## 2021-11-08 ENCOUNTER — Telehealth: Payer: Self-pay

## 2021-11-08 ENCOUNTER — Ambulatory Visit (INDEPENDENT_AMBULATORY_CARE_PROVIDER_SITE_OTHER): Payer: Medicare HMO | Admitting: Internal Medicine

## 2021-11-08 DIAGNOSIS — K5520 Angiodysplasia of colon without hemorrhage: Secondary | ICD-10-CM

## 2021-11-08 DIAGNOSIS — K5900 Constipation, unspecified: Secondary | ICD-10-CM | POA: Diagnosis not present

## 2021-11-08 DIAGNOSIS — K921 Melena: Secondary | ICD-10-CM | POA: Diagnosis not present

## 2021-11-08 DIAGNOSIS — D500 Iron deficiency anemia secondary to blood loss (chronic): Secondary | ICD-10-CM

## 2021-11-08 LAB — CBC WITH DIFFERENTIAL/PLATELET
Basophils Absolute: 0 10*3/uL (ref 0.0–0.1)
Basophils Relative: 0.5 % (ref 0.0–3.0)
Eosinophils Absolute: 0.2 10*3/uL (ref 0.0–0.7)
Eosinophils Relative: 3.5 % (ref 0.0–5.0)
HCT: 25.5 % — ABNORMAL LOW (ref 36.0–46.0)
Hemoglobin: 8.1 g/dL — ABNORMAL LOW (ref 12.0–15.0)
Lymphocytes Relative: 14.3 % (ref 12.0–46.0)
Lymphs Abs: 1 10*3/uL (ref 0.7–4.0)
MCHC: 31.9 g/dL (ref 30.0–36.0)
MCV: 82.3 fl (ref 78.0–100.0)
Monocytes Absolute: 0.6 10*3/uL (ref 0.1–1.0)
Monocytes Relative: 9.2 % (ref 3.0–12.0)
Neutro Abs: 5 10*3/uL (ref 1.4–7.7)
Neutrophils Relative %: 72.5 % (ref 43.0–77.0)
Platelets: 286 10*3/uL (ref 150.0–400.0)
RBC: 3.1 Mil/uL — ABNORMAL LOW (ref 3.87–5.11)
RDW: 16.4 % — ABNORMAL HIGH (ref 11.5–15.5)
WBC: 6.9 10*3/uL (ref 4.0–10.5)

## 2021-11-08 LAB — IBC PANEL
Iron: 19 ug/dL — ABNORMAL LOW (ref 42–145)
Saturation Ratios: 5 % — ABNORMAL LOW (ref 20.0–50.0)
TIBC: 382.2 ug/dL (ref 250.0–450.0)
Transferrin: 273 mg/dL (ref 212.0–360.0)

## 2021-11-08 LAB — TSH: TSH: <0.01 u[IU]/mL — ABNORMAL LOW (ref 0.35–5.50)

## 2021-11-08 MED ORDER — LINACLOTIDE 145 MCG PO CAPS: 145.0000 ug | ORAL_CAPSULE | Freq: Every day | ORAL | 3 refills | Status: DC

## 2021-11-08 MED ORDER — NA SULFATE-K SULFATE-MG SULF 17.5-3.13-1.6 GM/177ML PO SOLN: 1.0000 | ORAL | 0 refills | Status: DC

## 2021-11-08 MED ORDER — PANTOPRAZOLE SODIUM 40 MG PO TBEC: 40.0000 mg | DELAYED_RELEASE_TABLET | Freq: Every day | ORAL | 3 refills | Status: DC

## 2021-11-08 MED ORDER — SANDOSTATIN LAR DEPOT 10 MG IM KIT: 10.0000 mg | PACK | INTRAMUSCULAR | 3 refills | Status: DC

## 2021-11-08 NOTE — Patient Instructions (Addendum)
If you are age 72 or older, your body mass index should be between 23-30. Your Body mass index is 30.79 kg/m. If this is out of the aforementioned range listed, please consider follow up with your Primary Care Provider. ________________________________________________________  The [facility] providers would like to encourage you to use [facility] to communicate with providers for non-urgent requests or questions.  Due to long hold times on the telephone, sending your provider a message by [facility] may be a faster and more efficient way to get a response.  Please allow 48 business hours for a response.  Please remember that this is for non-urgent requests.  _______________________________________________________  Your provider has requested that you go to the basement level for lab work before leaving today. Press "B" on the elevator. The lab is located at the first door on the left as you exit the elevator.  You have been scheduled for a colonoscopy. Please follow written instructions given to you at your visit today.  Please pick up your prep supplies at the pharmacy within the next 1-3 days. If you use inhalers (even only as needed), please bring them with you on the day of your procedure.  Due to recent changes in healthcare laws, you may see the results of your imaging and laboratory studies on MyChart before your provider has had a chance to review them.  We understand that in some cases there may be results that are confusing or concerning to you. Not all laboratory results come back in the same time frame and the provider may be waiting for multiple results in order to interpret others.  Please give [place] 48 hours in order for your provider to thoroughly review all the results before contacting the office for clarification of your results.   Drink 8 cups of water a day and walk 30 minutes a day.  Please purchase the following medications over the counter and take as directed: Fiber supplement such  as Benefiber- use as directed daily SennaLax: Take as  directed up to 3 times a day to achieve regular bowel movements  We have sent the following medications to your pharmacy for you to pick up at your convenience:  START: Linzess 145 mcg daily before breakfast. START: Sandostatin LAR '10mg'$   monthly REFILL: pantoprazole  Thank you for entrusting me with your care and choosing [facility].  Dr Lorenso Courier

## 2021-11-08 NOTE — Telephone Encounter (Signed)
   Lacey Vega Aug 09, 1950 [id number]  Dear Dr Marlou Sa,  We have scheduled the above named patient for a colonoscopy/small bowel enteroscopy procedure. Our records show that she is on anticoagulation therapy.  We realize that the patient is not currently taking Plavix, but in the event she was to be restarted we would need clearance to hold. Please advise as to whether the patient may come off their therapy of Plavix 5 to 7 days prior to their procedure which is scheduled for 11-27-21.  Please route your response to Elmyra Ricks, [place] or fax response to [phone]  Sincerely,    [facility]enter[facility]

## 2021-11-08 NOTE — Progress Notes (Signed)
Chief Complaint: Melena and anemia  HPI : 71 year old female with history of small bowel angioectasias, PUD, achalasia s/p POEM in 2019, asthma, DM, anemia gallstones, TIA, hypothyroidism presents with melena and anemia  Patient presents as a second opinion for ongoing issues with anemia and melena.  She has been quite frustrated with how often she has had to get blood transfusions and IV iron infusions to keep her blood counts up.  She states that she is still seeing black tarry stools.  The stools are sticky.  She is taking oral iron supplements currently. She has felt very fatigued due to her ongoing issues with anemia.  Patient is typically on Plavix therapy but due to her recent drop in her blood counts and her ongoing melena, her PCP asked to hold her Plavix starting last week.  She has not restarted her Plavix therapy yet.  Denies NSAID use.  She is currently on pantoprazole 40 mg QD.  Patient has a history of achalasia and was treated with POEM procedure at [facility] with Dr. Stephanie Acre in 2019.  She does not like taking MiraLAX  Past Medical History:  Diagnosis Date   Anemia    Anxiety    Asthma    Asthma    Bronchitis    hx   Colon polyps    hyperplastic   Diabetes (HCC)    mild   Diabetes mellitus    DJD (degenerative joint disease), cervical    Dysphagia    history of   Dyspnea    Gallstones    GERD (gastroesophageal reflux disease)    H/O chest pain    secondary to anemia   Headache    History of arteriovenous malformation (AVM)    History of hiatal hernia    History of tobacco use    Hyperlipemia    Hypertension    Hypothyroidism    Iron deficiency anemia    Sickle cell anemia ([place])    "patient is not aware of this"   Thyroid disease    TIA (transient ischemic attack)    "was told that she possible had one"     Past Surgical History:  Procedure Laterality Date   ABDOMINAL AORTIC ENDOVASCULAR STENT GRAFT Bilateral 01/31/2021   Procedure: ENDOVASCULAR  ANEURYSM REPAIR (EVAR)Bilateral Groin Cutdown, left femoral endaterectomy with bovine patch angioplasty.;  Surgeon: Cherre Robins, MD;  Location: [facility] OR;  Service: Vascular;  Laterality: Bilateral;   ABDOMINAL HYSTERECTOMY     ANTERIOR CERVICAL DECOMP/DISCECTOMY FUSION N/A 10/03/2016   Procedure: ANTERIOR CERVICAL DECOMPRESSION/DISCECTOMY FUSION CERVICAL THREE- CERVICAL FOUR;  Surgeon: Ashok Pall, MD;  Location: [place];  Service: Neurosurgery;  Laterality: N/A;  Right side approach   BOTOX INJECTION  08/29/2015   Procedure: BOTOX INJECTION;  Surgeon: Wilford Corner, MD;  Location: [place];  Service: Endoscopy;;   CHOLECYSTECTOMY     COLONOSCOPY  12/03/2011   Procedure: COLONOSCOPY;  Surgeon: [name], MD;  Location: [facility];  Service: Endoscopy;  Laterality: N/A;   ENTEROSCOPY N/A 07/07/2021   Procedure: ENTEROSCOPY;  Surgeon: Clarene Essex, MD;  Location: [facility];  Service: Endoscopy;  Laterality: N/A;   ENTEROSCOPY N/A 08/06/2021   Procedure: ENTEROSCOPY;  Surgeon: Ronnette Juniper, MD;  Location: [facility];  Service: Gastroenterology;  Laterality: N/A;   ESOPHAGEAL MANOMETRY N/A 07/13/2017   Procedure: ESOPHAGEAL MANOMETRY (EM);  Surgeon: Wilford Corner, MD;  Location: [facility];  Service: Endoscopy;  Laterality: N/A;   ESOPHAGOGASTRODUODENOSCOPY  12/03/2011   Procedure:  ESOPHAGOGASTRODUODENOSCOPY (EGD);  Surgeon: [name], MD;  Location[facility];  Service: Endoscopy;  Laterality: N/A;   ESOPHAGOGASTRODUODENOSCOPY (EGD) WITH PROPOFOL N/A 08/29/2015   Procedure: ESOPHAGOGASTRODUODENOSCOPY (EGD) WITH PROPOFOL;  Surgeon: Wilford Corner, MD;  Location: [facility] ENDOSCOPY;  Service: Endoscopy;  Laterality: N/A;   ESOPHAGOGASTRODUODENOSCOPY (EGD) WITH PROPOFOL N/A 05/17/2019   Procedure: ESOPHAGOGASTRODUODENOSCOPY (EGD) WITH PROPOFOL;  Surgeon: Wilford Corner, MD;  Location: [facility];  Service: Endoscopy;  Laterality: N/A;   ESOPHAGOGASTRODUODENOSCOPY (EGD)  WITH PROPOFOL N/A 04/24/2021   Procedure: ESOPHAGOGASTRODUODENOSCOPY (EGD) WITH PROPOFOL;  Surgeon: Otis Brace, MD;  Location: [place];  Service: Gastroenterology;  Laterality: N/A;   GIVENS CAPSULE STUDY N/A 08/05/2018   Procedure: GIVENS CAPSULE STUDY;  Surgeon: Wilford Corner, MD;  Location: [place];  Service: Endoscopy;  Laterality: N/A;   GIVENS CAPSULE STUDY N/A 07/06/2021   Procedure: GIVENS CAPSULE STUDY;  Surgeon: Clarene Essex, MD;  Location: [facility];  Service: Endoscopy;  Laterality: N/A;   HOT HEMOSTASIS  12/03/2011   Procedure: HOT HEMOSTASIS (ARGON PLASMA COAGULATION/BICAP);  Surgeon: [name], MD;  Location: [facility];  Service: Endoscopy;  Laterality: N/A;   HOT HEMOSTASIS N/A 07/07/2021   Procedure: HOT HEMOSTASIS (ARGON PLASMA COAGULATION/BICAP);  Surgeon: Clarene Essex, MD;  Location: [facility];  Service: Endoscopy;  Laterality: N/A;   HOT HEMOSTASIS N/A 08/06/2021   Procedure: HOT HEMOSTASIS (ARGON PLASMA COAGULATION/BICAP);  Surgeon: Ronnette Juniper, MD;  Location: [facility];  Service: Gastroenterology;  Laterality: N/A;   NECK SURGERY     PERIPHERAL VASCULAR INTERVENTION  03/29/2021   Procedure: PERIPHERAL VASCULAR INTERVENTION;  Surgeon: Cherre Robins, MD;  Location: [place] [facility];  Service: Cardiovascular;;  Lt SFA Brachial Approach   ROTATOR CUFF REPAIR     right   ROTATOR CUFF REPAIR Right 2014   THROMBECTOMY FEMORAL ARTERY Left 01/31/2021   Procedure: THROMBECTOMY POPLITEAL  ARTERY;  Surgeon: Cherre Robins, MD;  Location: [facility] OR;  Service: Vascular;  Laterality: Left;   TONSILLECTOMY     Family History  Problem Relation Age of Onset   Diabetes Mother    Heart attack Brother    Cancer Maternal Aunt    Social History   Tobacco Use   Smoking status: Former    Types: Cigarettes    Quit date: 06/02/2000    Years since quitting: 21.4   Smokeless tobacco: Never  Vaping Use   Vaping Use: Never used  Substance Use Topics    Alcohol use: No   Drug use: No   Current Outpatient Medications  Medication Sig Dispense Refill   acetaminophen (TYLENOL) 500 MG tablet Take 500-1,000 mg by mouth every 6 (six) hours as needed for mild pain or headache.     albuterol (VENTOLIN HFA) 108 (90 Base) MCG/ACT inhaler Inhale 1-2 puffs into the lungs every 6 (six) hours as needed for wheezing or shortness of breath.     aspirin EC 81 MG tablet Take 1 tablet (81 mg total) by mouth daily. **Start on 11/25** (Patient taking differently: Take 81 mg by mouth daily.) 30 tablet 11   atorvastatin (LIPITOR) 40 MG tablet Take 40 mg by mouth at bedtime.     carisoprodol (SOMA) 250 MG tablet Take 250 mg by mouth 2 (two) times daily as needed (cramps).      Cinnamon 500 MG TABS Take 1,000 mg by mouth every evening.     donepezil (ARICEPT) 5 MG tablet Take 5 mg by mouth at bedtime.     ferrous sulfate  325 (65 FE) MG tablet Take 1 tablet (325 mg total) by mouth daily with breakfast. with orange juice 90 tablet 1   folic acid (FOLVITE) 768 MCG tablet Take 800 mcg by mouth daily.     furosemide (LASIX) 20 MG tablet Take 20 mg by mouth daily.      levothyroxine (SYNTHROID) 100 MCG tablet Take 100 mcg by mouth daily.     linaclotide (LINZESS) 145 MCG CAPS capsule Take 1 capsule (145 mcg total) by mouth daily before breakfast. 30 capsule 3   Magnesium Oxide 420 (252 Mg) MG TABS Take 420-840 mg by mouth as needed (for cramps).     montelukast (SINGULAIR) 10 MG tablet Take 10 mg by mouth every evening.     Multiple Vitamins-Minerals (HAIR/SKIN/NAILS) CAPS Take 5,000 mcg by mouth daily.     Na Sulfate-K Sulfate-Mg Sulf (SUPREP BOWEL PREP KIT) 17.5-3.13-1.6 GM/177ML SOLN Take 1 kit by mouth as directed. 324 mL 0   nitroGLYCERIN (NITROSTAT) 0.4 MG SL tablet Place 0.4 mg under the tongue every 5 (five) minutes as needed for chest pain.     octreotide (SANDOSTATIN LAR DEPOT) 10 MG injection Inject 10 mg into the muscle every 28 (twenty-eight) days. 1 each 3    Omega-3 Fatty Acids (FISH OIL BURP-LESS) 1200 MG CAPS Take 1,000 mg by mouth every evening.     vitamin B-12 (CYANOCOBALAMIN) 1000 MCG tablet Take 1,000 mcg by mouth daily.     clopidogrel (PLAVIX) 75 MG tablet Take 1 tablet (75 mg total) by mouth daily. **Start on 11/25** (Patient not taking: Reported on 11/08/2021) 30 tablet 11   pantoprazole (PROTONIX) 40 MG tablet Take 1 tablet (40 mg total) by mouth daily before breakfast. 30 tablet 3   No current facility-administered medications for this visit.   Facility-Administered Medications Ordered in Other Visits  Medication Dose Route Frequency Provider Last Rate Last Admin   0.9 %  sodium chloride infusion   Intravenous Continuous Wilford Corner, MD       Allergies  Allergen Reactions   Oxycodone Shortness Of Breath   Review of Systems: All systems reviewed and negative except where noted in HPI.   Physical Exam: BP 130/60   Pulse 66   Ht 5' 5" (1.651 m)   Wt 185 lb (83.9 kg)   BMI 30.79 kg/m  Constitutional: Pleasant,well-developed, female in no acute distress. HEENT: Normocephalic and atraumatic. Conjunctivae are normal. No scleral icterus. Cardiovascular: Normal rate, regular rhythm.  Pulmonary/chest: Effort normal and breath sounds normal. No wheezing, rales or rhonchi. Abdominal: Soft, nondistended, nontender. Bowel sounds active throughout. There are no masses palpable. No hepatomegaly. Extremities: No edema Neurological: Alert and oriented to person place and time. Skin: Skin is warm and dry. No rashes noted. Psychiatric: Normal mood and affect. Behavior is normal.  Labs 10/2021: BMP with elevated BUN of 25 and elevated Cr of 1.26. CBC with low Hb of 6.2.  CTA A/P w/contrast 03/16/21: IMPRESSION: VASCULAR  1. Focal flow limiting dissection flap in the origin of the left superficial femoral artery adjacent to the cutdown and presumed endarterectomy site in the common femoral artery. The vessels distal (SFA, popliteal  and runoff arteries) remain patent but are relatively diminutive in caliber consistent with decreased flow. This is likely the source of the patient's claudication. 2. 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 New occlusion of the left internal iliac artery likely decreases ability for auto  collateralization around the flow limiting lesion in the proximal superficial femoral artery. 3. Successful endovascular aortic repair of large infrarenal abdominal aortic aneurysm. Aneurysm sac size is slightly decreased in diameter. No evidence of endoleak on arterial phase imaging. Please note this study was not protocoled for thorough evaluation of endoleak (no delayed phase images obtained). 4. 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 NON-VASCULAR  1. No acute abnormality within the abdomen or pelvis. 2. Similar appearance of 10 mm ground-glass attenuation nodular opacity in the peripheral right lower lobe as seen on the recent prior imaging. As previously noted, initial follow-up at 6-12 months is recommended to confirm persistence. If persistent, repeat CT is recommended every 2 years until 5 years of stability has been established. 3. Stable misty mesentery which is a nonspecific finding. This is favored to be related to the relatively large aneurysm size and resultant congestion of lymphatic outflow. Recommend attention on routine follow-up imaging post aneurysm repair. If there is evidence of progressive lymphadenopathy, then an underlying lymphoproliferative disorder would have to be considered  Colonoscopy 12/2011: Colon polyp s/p snare cautery. Path: Colon, polyp(s), left descending TUBULAR ADENOMA. NO HIGH GRADE DYSPLASIA OR MALIGNANCY IDENTIFIED. (ONE  FRAGMENT).  Esophageal manometry 07/13/17: Achalasia type 3  EGD with POEM 10/23/17: Findings:       Abnormal motility was noted in the lower third of the esophagus. There       is spasticity of the esophageal body. Two small pulsion diverticula were       noted in the body. Normal peristalsis not noted. The gastroscope was       fitted with a clear 54m straight cap. Retained secretions were present       in the esophagus. Motility of the esophageal body was assessed       endoscopically. The GEJ was located at 40 cm and was tight. The       endoscope was advanced through the GEJ. The esophagus was washed with       chlorhexidine. The mucosal incision was created at 24 cm. A mucosal bleb       was created by injection a combination of Voluven and blue dye into the       submucosa. A 2 cm incision was made with a triangle tip (TT) knife using       Endocut mode at 5:00 on the esophageal wall. The submucosal fibers were       dissected with spray coagulation (532W, effect 2) and the endoscope       entered the submucosal space. A submucosal tunnel was created using       spray coagulation and injection of indigo carmine solution via the pump.       When vessels were identified they were treated. The myotomy was       performed from 28 to 44 cm using spray coagulation current (579W, effect       2), confirmed by anatomy. Hemostasis was verified at the end of the       procedure and gentamicin 849mwas injected into the tunnel. The mucosal       incision was then securely closed with 6 hemoclips The stomach was normal.       Multiple small angioectasias without bleeding were found in the duodenal       bulb and in the second portion of the  duodenum. Fulguration to ablate       the lesion by TT knife spray coagulation was successful.   VCE 08/05/18: Small non-bleeding gastric AVM. Small nonbleeding proximal small bowel AVM. Duodenal erosion  EGD 05/17/19: - LA Grade C esophagitis with  bleeding. - Normal stomach. - A few small non-bleeding angiodysplastic lesions in the duodenum. - Mucosal changes in the duodenum.  EGD 04/24/21: - Z-line regular, 40 cm from the incisors. - Gastritis with hemorrhage. - Normal duodenal bulb, first portion of the duodenum, second portion of the duodenum and third portion of the duodenum. - No specimens collected.  VCE 07/06/21 Findings: First gastric image 00:01:29 First duodenal image 00:15:34 First cecal image 02:05:04 Blood visualized in the stomach. Duodenal angioectasia visualized at 00:15:44, 00:15:52, and 00:16:23.  Blood clot with possible active bleeding visualized at 00:21:13 and 00:21:23. Summary and Recommendations: Recommend SBE for treatment of duodenal angioectasias and evaluation of bleeding source from the stomach and proximal small bowel. Spoke with Dr. Watt Climes and he will plan to do this procedure tomorrow. Made NPO after MN in preparation.  SBE 07/07/21: - Normal esophagus. - A single non-bleeding angiodysplastic lesion in the stomach. Treated with argon plasma coagulation (APC). - Three non-bleeding angiodysplastic lesions in the duodenum. Treated with argon plasma coagulation (APC). - The examined portion of the jejunum was normal. - No specimens collected.  SBE 08/06/21: - LA Grade A esophagitis with bleeding. - Non-bleeding gastric ulcer with a clean ulcer base (Forrest Class III). - Multiple recently bleeding angioectasias in the duodenum. Treated with argon plasma coagulation (APC). - No specimens collected.  ASSESSMENT AND PLAN: IDA Melena Constipation SB angioectasias History of PUD and esophagitis Patient presents with persistent iron deficiency anemia and has known history of small bowel angiectasia's that have been treated with APC in the past.  She has been dependent on PRBC transfusions so we will attempt to get her on octreotide therapy to prevent development of small bowel AVMs.  In the meantime  we will recheck her blood counts, iron levels and thyroid levels.  We will plan for repeat SBE to treat any small bowel angiectasias that were found.  Patient may benefit from longstanding PPI twice daily therapy, but will look for any signs of PUD or gastritis in her upcoming SBE. Patient has also not had a colonoscopy for the last 10 years so we will plan for repeat colonoscopy for evaluation as well.  I encouraged her to try to start some constipation therapies before undergoing her colonoscopy procedure to ensure an adequate prep.  Patient does not like MiraLAX so we will start Linzess for constipation. - CBC, ferritin/IBC, TSH - Start 8 cups of water per day - Cont pantoprazole 40 mg QD. Refilled. - Start octreotide 10 mg IM monthly  - Start Linzess 145 mcg QD.  Gave samples - SBE/Colonoscopy [facility]. Suprep.  Patient is on Plavix therapy and will need to have this held 5 days before her procedure (She is currently not taking Plavix but this will need to make sure this is held if it is restarted). Will plan to touch base with her PCP Dr. Marlou Sa.  Will need to be done at the hospital because patient may have angioectasias that require APC  Christia Reading, MD  I spent 68 minutes of time, including in depth chart review, independent review of results as outlined above, communicating results with the patient directly, face-to-face time with the patient, coordinating care, ordering studies and medications as appropriate, and documentation.

## 2021-11-12 ENCOUNTER — Telehealth: Payer: Self-pay | Admitting: Hematology and Oncology

## 2021-11-12 ENCOUNTER — Inpatient Hospital Stay: Payer: Medicare HMO

## 2021-11-12 ENCOUNTER — Other Ambulatory Visit: Payer: Self-pay

## 2021-11-12 ENCOUNTER — Other Ambulatory Visit: Payer: Self-pay | Admitting: Hematology and Oncology

## 2021-11-12 ENCOUNTER — Inpatient Hospital Stay (HOSPITAL_BASED_OUTPATIENT_CLINIC_OR_DEPARTMENT_OTHER): Payer: Medicare HMO | Admitting: Hematology and Oncology

## 2021-11-12 VITALS — BP 131/59 | HR 67 | Temp 98.2°F | Resp 18 | Ht 65.0 in | Wt 186.1 lb

## 2021-11-12 VITALS — BP 110/51 | HR 66 | Temp 98.3°F | Resp 17

## 2021-11-12 DIAGNOSIS — D573 Sickle-cell trait: Secondary | ICD-10-CM | POA: Diagnosis not present

## 2021-11-12 DIAGNOSIS — D500 Iron deficiency anemia secondary to blood loss (chronic): Secondary | ICD-10-CM | POA: Diagnosis not present

## 2021-11-12 LAB — CMP (CANCER CENTER ONLY)
ALT: 11 U/L (ref 0–44)
AST: 16 U/L (ref 15–41)
Albumin: 3.6 g/dL (ref 3.5–5.0)
Alkaline Phosphatase: 75 U/L (ref 38–126)
Anion gap: 4 — ABNORMAL LOW (ref 5–15)
BUN: 16 mg/dL (ref 8–23)
CO2: 31 mmol/L (ref 22–32)
Calcium: 9.1 mg/dL (ref 8.9–10.3)
Chloride: 110 mmol/L (ref 98–111)
Creatinine: 1 mg/dL (ref 0.44–1.00)
GFR, Estimated: >60 mL/min (ref >60)
Glucose, Bld: 103 mg/dL — ABNORMAL HIGH (ref 70–99)
Potassium: 3.5 mmol/L (ref 3.5–5.1)
Sodium: 145 mmol/L (ref 135–145)
Total Bilirubin: 0.6 mg/dL (ref 0.3–1.2)
Total Protein: 6.4 g/dL — ABNORMAL LOW (ref 6.5–8.1)

## 2021-11-12 LAB — CBC WITH DIFFERENTIAL (CANCER CENTER ONLY)
Abs Immature Granulocytes: 0.02 10*3/uL (ref 0.00–0.07)
Basophils Absolute: 0 10*3/uL (ref 0.0–0.1)
Basophils Relative: 0 %
Eosinophils Absolute: 0.1 10*3/uL (ref 0.0–0.5)
Eosinophils Relative: 3 %
HCT: 26.2 % — ABNORMAL LOW (ref 36.0–46.0)
Hemoglobin: 7.9 g/dL — ABNORMAL LOW (ref 12.0–15.0)
Immature Granulocytes: 0 %
Lymphocytes Relative: 13 %
Lymphs Abs: 0.7 10*3/uL (ref 0.7–4.0)
MCH: 25.9 pg — ABNORMAL LOW (ref 26.0–34.0)
MCHC: 30.2 g/dL (ref 30.0–36.0)
MCV: 85.9 fL (ref 80.0–100.0)
Monocytes Absolute: 0.4 10*3/uL (ref 0.1–1.0)
Monocytes Relative: 8 %
Neutro Abs: 4.2 10*3/uL (ref 1.7–7.7)
Neutrophils Relative %: 76 %
Platelet Count: 299 10*3/uL (ref 150–400)
RBC: 3.05 MIL/uL — ABNORMAL LOW (ref 3.87–5.11)
RDW: 15.6 % — ABNORMAL HIGH (ref 11.5–15.5)
WBC Count: 5.6 10*3/uL (ref 4.0–10.5)
nRBC: 0 % (ref 0.0–0.2)

## 2021-11-12 LAB — IRON AND IRON BINDING CAPACITY (CC-WL,HP ONLY)
Iron: 155 ug/dL (ref 28–170)
Saturation Ratios: 43 % — ABNORMAL HIGH (ref 10.4–31.8)
TIBC: 364 ug/dL (ref 250–450)
UIBC: 209 ug/dL (ref 148–442)

## 2021-11-12 LAB — RETIC PANEL
Immature Retic Fract: 22.4 % — ABNORMAL HIGH (ref 2.3–15.9)
RBC.: 3.06 MIL/uL — ABNORMAL LOW (ref 3.87–5.11)
Retic Count, Absolute: 68.9 10*3/uL (ref 19.0–186.0)
Retic Ct Pct: 2.3 % (ref 0.4–3.1)
Reticulocyte Hemoglobin: 24.4 pg — ABNORMAL LOW (ref >27.9)

## 2021-11-12 LAB — SAMPLE TO BLOOD BANK

## 2021-11-12 LAB — FERRITIN: Ferritin: 63 ng/mL (ref 11–307)

## 2021-11-12 MED ORDER — SODIUM CHLORIDE 0.9 % IV SOLN: Freq: Once | INTRAVENOUS | Status: AC

## 2021-11-12 MED ORDER — SODIUM CHLORIDE 0.9 % IV SOLN
200.0000 mg | Freq: Once | INTRAVENOUS | Status: AC
  Administered 2021-11-12: 200 mg via INTRAVENOUS
  Filled 2021-11-12: qty 200

## 2021-11-12 NOTE — Telephone Encounter (Signed)
Per 6/13 los called and spoke to pt about to appointment.  Pt confirmed appointment

## 2021-11-12 NOTE — Progress Notes (Signed)
[place] [phone]   [phone]  PROGRESS NOTE  Patient Care Team: Rogers Blocker, MD as PCP - General (Internal Medicine)  Hematological/Oncological History # Iron Deficiency Anemia due to Chronic Blood Loss # Sickle Cell Trait  1) 03/26/2009: Hgb 7.2, first Hgb on record 2) 09/02/2011: 2 units of PRBC at [facility] (Hgb noted at 8.9).  3)  12/03/2011: colonoscopy, found colon polyp and sigmoid diverticula. 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 Underwent argon plasma coagulation  4) Jan 2018: colonoscopy with 2 tubular adenomas removed. Small colonic AVM and small internal hemorrhoids.  5)10/24/2017: Hgb 7.7, MCV 71, Plt 338, and WBC 6.7. POEM procedure for Achalasia. duodenal AVM fulgurated.  6) 03/2018: Hgb 8.2.  7) March 2020: capsule endoscopy: nonbleeding gastric and proximal small bowel AVM,  8) 04/26/2019: Establish Care with Dr. Lorenso Courier  WBC 6.1, Hgb 9.3, MCV 75.3, Plt 289 9) 05/17/2019: Underwent EGD which showed esophagitis and small non-bleeding duodenal angiodysplasias. 10) 07/27/2019: WBC 5.9, Hgb 9.8, MCV 77.8, Plt 271. Recommend IV iron based on labs. Received feraheme 570m IV q7 days x 2 doses on 3/8 and 08/15/2019. Hgb electrophoresis confirms sickle cell trait.  11) 10/24/2019: WBC 5.4, Hgb 10.2, MCV 80.5, Plt 252 12) 02/15/2020: WBC 5.1, Hgb 9.7, MCV 80.2, Plt 280 13) 9/24-10/06/2019: IV feraheme [id number] 2 doses 14) 04/22/2021: Patient presented to emergency department with heavy nosebleeding.  Had a syncopal episode in the emergency department.  Discharged on 04/24/2021. Hgb 7.2 on admission.  15) 07/05/2021: presented to clinic with Hgb 6.2, MCV 88.9 and Plt 286. Admitted for GI workup.   Interval History:  [place]71 y.o. female with medical history significant for iron deficiency anemia 2/2 to chronic blood loss/ sickle cell trait who presents for a follow up visit. The patient's last visit was on  10/14/2021. In the interim since the last visit Lacey Vega establish care with [facility] and has started another round of IV iron therapy.  On exam today Lacey Vega she continues to feel cold.  She is otherwise been tolerating the IV iron infusions quite well.  She notes that she is establish care with gastroenterology and colonoscopy is planned for 11/27/2021.  She notes that she is not as tired as she was before though she can occasionally get short winded.  She notes that she is not having much in the way of dark stools or bright red blood per rectum.  She has also been taking Linzess.  She notes that stools have been regular colored and brown.  She notes that she has been eating all right and appetite has been good.  She denies any issues of lightheadedness or dizziness.  She otherwise denies any fevers, chills, sweats, nausea, vomiting or diarrhea.  A full 10 point ROS is listed below.  MEDICAL HISTORY:  Past Medical History:  Diagnosis Date   Anemia    Anxiety    Asthma    Asthma    Bronchitis    hx   Colon polyps    hyperplastic   [place]   mild   Diabetes mellitus    DJD (degenerative joint disease), cervical    Dysphagia    history of   Dyspnea    Gallstones    GERD (gastroesophageal reflux disease)    H/O chest pain    secondary to anemia   Headache    History of arteriovenous malformation (AVM)  History of hiatal hernia    History of tobacco use    Hyperlipemia    Hypertension    Hypothyroidism    Iron deficiency anemia    Sickle cell anemia ([place])    "patient is not aware of this"   Thyroid disease    TIA (transient ischemic attack)    "was told that she possible had one"    SURGICAL HISTORY: Past Surgical History:  Procedure Laterality Date   ABDOMINAL AORTIC ENDOVASCULAR STENT GRAFT Bilateral 01/31/2021   Procedure: ENDOVASCULAR ANEURYSM REPAIR (EVAR)Bilateral Groin Cutdown, left femoral endaterectomy with bovine patch angioplasty.;  Surgeon:  Cherre Robins, MD;  Location: [facility];  Service: Vascular;  Laterality: Bilateral;   ABDOMINAL HYSTERECTOMY     ANTERIOR CERVICAL DECOMP/DISCECTOMY FUSION N/A 10/03/2016   Procedure: ANTERIOR CERVICAL DECOMPRESSION/DISCECTOMY FUSION CERVICAL THREE- CERVICAL FOUR;  Surgeon: Ashok Pall, MD;  Location: [facility];  Service: Neurosurgery;  Laterality: N/A;  Right side approach   BOTOX INJECTION  08/29/2015   Procedure: BOTOX INJECTION;  Surgeon: Wilford Corner, MD;  Location: [place];  Service: Endoscopy;;   CHOLECYSTECTOMY     COLONOSCOPY  12/03/2011   Procedure: COLONOSCOPY;  Surgeon: [name], MD;  Location: [facility];  Service: Endoscopy;  Laterality: N/A;   ENTEROSCOPY N/A 07/07/2021   Procedure: ENTEROSCOPY;  Surgeon: Clarene Essex, MD;  Location: [facility];  Service: Endoscopy;  Laterality: N/A;   ENTEROSCOPY N/A 08/06/2021   Procedure: ENTEROSCOPY;  Surgeon: Ronnette Juniper, MD;  Location: [facility];  Service: Gastroenterology;  Laterality: N/A;   ESOPHAGEAL MANOMETRY N/A 07/13/2017   Procedure: ESOPHAGEAL MANOMETRY (EM);  Surgeon: Wilford Corner, MD;  Location: [facility];  Service: Endoscopy;  Laterality: N/A;   ESOPHAGOGASTRODUODENOSCOPY  12/03/2011   Procedure: ESOPHAGOGASTRODUODENOSCOPY (EGD);  Surgeon: [name], MD;  Location[facility];  Service: Endoscopy;  Laterality: N/A;   ESOPHAGOGASTRODUODENOSCOPY (EGD) WITH PROPOFOL N/A 08/29/2015   Procedure: ESOPHAGOGASTRODUODENOSCOPY (EGD) WITH PROPOFOL;  Surgeon: Wilford Corner, MD;  [facility];  Service: Endoscopy;  Laterality: N/A;   ESOPHAGOGASTRODUODENOSCOPY (EGD) WITH PROPOFOL N/A 05/17/2019   Procedure: ESOPHAGOGASTRODUODENOSCOPY (EGD) WITH PROPOFOL;  Surgeon: Wilford Corner, MD;  Location: [facility];  Service: Endoscopy;  Laterality: N/A;   ESOPHAGOGASTRODUODENOSCOPY (EGD) WITH PROPOFOL N/A 04/24/2021   Procedure: ESOPHAGOGASTRODUODENOSCOPY (EGD) WITH PROPOFOL;  Surgeon: Otis Brace, MD;  Location: [place];  Service: Gastroenterology;  Laterality: N/A;   GIVENS CAPSULE STUDY N/A 08/05/2018   Procedure: GIVENS CAPSULE STUDY;  Surgeon: Wilford Corner, MD;  Location: [place];  Service: Endoscopy;  Laterality: N/A;   GIVENS CAPSULE STUDY N/A 07/06/2021   Procedure: GIVENS CAPSULE STUDY;  Surgeon: Clarene Essex, MD;  Location[facility];  Service: Endoscopy;  Laterality: N/A;   HOT HEMOSTASIS  12/03/2011   Procedure: HOT HEMOSTASIS (ARGON PLASMA COAGULATION/BICAP);  Surgeon: [name], MD;  Location: [facility];  Service: Endoscopy;  Laterality: N/A;   HOT HEMOSTASIS N/A 07/07/2021   Procedure: HOT HEMOSTASIS (ARGON PLASMA COAGULATION/BICAP);  Surgeon: Clarene Essex, MD;  Location: [facility];  Service: Endoscopy;  Laterality: N/A;   HOT HEMOSTASIS N/A 08/06/2021   Procedure: HOT HEMOSTASIS (ARGON PLASMA COAGULATION/BICAP);  Surgeon: Ronnette Juniper, MD;  Location: [facility];  Service: Gastroenterology;  Laterality: N/A;   NECK SURGERY     PERIPHERAL VASCULAR INTERVENTION  03/29/2021   Procedure: PERIPHERAL VASCULAR INTERVENTION;  Surgeon: Cherre Robins, MD;  Location: [facility];  Service: Cardiovascular;;  Lt SFA Brachial Approach   ROTATOR CUFF REPAIR  right   ROTATOR CUFF REPAIR Right 2014   THROMBECTOMY FEMORAL ARTERY Left 01/31/2021   Procedure: THROMBECTOMY POPLITEAL  ARTERY;  Surgeon: Cherre Robins, MD;  Location[facility];  Service: Vascular;  Laterality: Left;   TONSILLECTOMY      SOCIAL HISTORY: Social History   Socioeconomic History   Marital status: Divorced    Spouse name: Not on file   Number of children: 2   Years of education: Not on file   Highest education level: Not on file  Occupational History   Occupation: group leader environmental services    Employer: [employer]    Comment: [employer]  Tobacco Use   Smoking status: Former    Types: Cigarettes    Quit date: 06/02/2000    Years since  quitting: 21.4   Smokeless tobacco: Never  Vaping Use   Vaping Use: Never used  Substance and Sexual Activity   Alcohol use: No   Drug use: No   Sexual activity: Not on file  Other Topics Concern   Not on file  Social History Narrative   ** Merged History Encounter **       Social Determinants of Health   Financial Resource Strain: Not on file  Food Insecurity: Not on file  Transportation Needs: Not on file  Physical Activity: Not on file  Stress: Not on file  Social Connections: Not on file  Intimate Partner Violence: Not on file    FAMILY HISTORY: Family History  Problem Relation Age of Onset   Diabetes Mother    Heart attack Brother    Cancer Maternal Aunt     ALLERGIES:  is allergic to oxycodone.  MEDICATIONS:  Current Outpatient Medications  Medication Sig Dispense Refill   acetaminophen (TYLENOL) 500 MG tablet Take 500-1,000 mg by mouth every 6 (six) hours as needed for mild pain or headache.     albuterol (VENTOLIN HFA) 108 (90 Base) MCG/ACT inhaler Inhale 1-2 puffs into the lungs every 6 (six) hours as needed for wheezing or shortness of breath.     aspirin EC 81 MG tablet Take 1 tablet (81 mg total) by mouth daily. **Start on 11/25** (Patient taking differently: Take 81 mg by mouth daily.) 30 tablet 11   atorvastatin (LIPITOR) 40 MG tablet Take 40 mg by mouth at bedtime.     carisoprodol (SOMA) 250 MG tablet Take 250 mg by mouth 2 (two) times daily as needed (cramps).      Cinnamon 500 MG TABS Take 1,000 mg by mouth every evening.     clopidogrel (PLAVIX) 75 MG tablet Take 1 tablet (75 mg total) by mouth daily. **Start on 11/25** (Patient not taking: Reported on 11/08/2021) 30 tablet 11   donepezil (ARICEPT) 5 MG tablet Take 5 mg by mouth at bedtime.     ferrous sulfate 325 (65 FE) MG tablet Take 1 tablet (325 mg total) by mouth daily with breakfast. with orange juice 90 tablet 1   folic acid (FOLVITE) 580 MCG tablet Take 800 mcg by mouth daily.     furosemide  (LASIX) 20 MG tablet Take 20 mg by mouth daily.      levothyroxine (SYNTHROID) 100 MCG tablet Take 100 mcg by mouth daily.     linaclotide (LINZESS) 145 MCG CAPS capsule Take 1 capsule (145 mcg total) by mouth daily before breakfast. 30 capsule 3   Magnesium Oxide 420 (252 Mg) MG TABS Take 420-840 mg by mouth as needed (for cramps).  montelukast (SINGULAIR) 10 MG tablet Take 10 mg by mouth every evening.     Multiple Vitamins-Minerals (HAIR/SKIN/NAILS) CAPS Take 5,000 mcg by mouth daily.     Na Sulfate-K Sulfate-Mg Sulf (SUPREP BOWEL PREP KIT) 17.5-3.13-1.6 GM/177ML SOLN Take 1 kit by mouth as directed. 324 mL 0   nitroGLYCERIN (NITROSTAT) 0.4 MG SL tablet Place 0.4 mg under the tongue every 5 (five) minutes as needed for chest pain.     octreotide (SANDOSTATIN LAR DEPOT) 10 MG injection Inject 10 mg into the muscle every 28 (twenty-eight) days. 1 each 3   Omega-3 Fatty Acids (FISH OIL BURP-LESS) 1200 MG CAPS Take 1,000 mg by mouth every evening.     pantoprazole (PROTONIX) 40 MG tablet Take 1 tablet (40 mg total) by mouth daily before breakfast. 30 tablet 3   vitamin B-12 (CYANOCOBALAMIN) 1000 MCG tablet Take 1,000 mcg by mouth daily.     No current facility-administered medications for this visit.   Facility-Administered Medications Ordered in Other Visits  Medication Dose Route Frequency Provider Last Rate Last Admin   0.9 %  sodium chloride infusion   Intravenous Continuous Wilford Corner, MD        REVIEW OF SYSTEMS:   Constitutional: ( - ) fevers, ( - )  chills , ( - ) night sweats (+) fatigue Eyes: ( - ) blurriness of vision, ( - ) double vision, ( - ) watery eyes Ears, nose, mouth, throat, and face: ( - ) mucositis, ( - ) sore throat Respiratory: ( - ) cough, ( - ) dyspnea, ( - ) wheezes Cardiovascular: ( - ) palpitation, ( - ) chest discomfort, ( - ) lower extremity swelling Gastrointestinal:  ( - ) nausea, ( - ) heartburn, ( - ) change in bowel habits Skin: ( - ) abnormal  skin rashes Lymphatics: ( - ) new lymphadenopathy, ( - ) easy bruising Neurological: ( - ) numbness, ( - ) tingling, ( - ) new weaknesses Behavioral/Psych: ( - ) mood change, ( - ) new changes  All other systems were reviewed with the patient and are negative.  PHYSICAL EXAMINATION: ECOG PERFORMANCE STATUS: 1 - Symptomatic but completely ambulatory  Vitals:   11/12/21 1138  BP: (!) 131/59  Pulse: 67  Resp: 18  Temp: 98.2 F (36.8 C)  SpO2: 96%   Filed Weights   11/12/21 1138  Weight: 186 lb 1.6 oz (84.4 kg)    GENERAL: well appearing elderly African American female in NAD  SKIN: skin color, texture, turgor are normal, no rashes or significant lesions EYES: conjunctiva are pink and non-injected, sclera clear LUNGS: clear to auscultation and percussion with normal breathing effort HEART: regular rate & rhythm and no murmurs and no lower extremity edema Musculoskeletal: no cyanosis of digits and no clubbing  PSYCH: alert & oriented x 3, fluent speech NEURO: no focal motor/sensory deficits  LABORATORY DATA:  I have reviewed the data as listed    Latest Ref Rng & Units 11/12/2021   10:50 AM 11/08/2021   11:20 AM 11/05/2021    5:25 PM  CBC  WBC 4.0 - 10.5 K/uL 5.6  6.9  7.5   Hemoglobin 12.0 - 15.0 g/dL 7.9  8.1 Repeated and verified X2.  6.2   Hematocrit 36.0 - 46.0 % 26.2  25.5  20.8   Platelets 150 - 400 K/uL 299  286.0  319        Latest Ref Rng & Units 11/12/2021   10:50 AM 11/05/2021  5:25 PM 11/05/2021    1:23 PM  CMP  Glucose 70 - 99 mg/dL 103  105  118   BUN 8 - 23 mg/dL 16  25  27    Creatinine 0.44 - 1.00 mg/dL 1.00  1.26  1.18   Sodium 135 - 145 mmol/L 145  141  142   Potassium 3.5 - 5.1 mmol/L 3.5  3.5  3.4   Chloride 98 - 111 mmol/L 110  109  105   CO2 22 - 32 mmol/L 31  24  29    Calcium 8.9 - 10.3 mg/dL 9.1  8.5  9.0   Total Protein 6.5 - 8.1 g/dL 6.4   6.4   Total Bilirubin 0.3 - 1.2 mg/dL 0.6   0.6   Alkaline Phos 38 - 126 U/L 75   82   AST 15 - 41 U/L  16   21   ALT 0 - 44 U/L 11   13     RADIOGRAPHIC STUDIES: None relevant to review.  No results found.  ASSESSMENT & Wallace 71 y.o. female with medical history significant for iron deficiency anemia 2/2 to chronic blood loss presents for a follow up visit.  Lacey Vega establish care with [place] in November 2020 and has been taking p.o. iron since that time.  When her levels failed to improve at her last visit she was started on IV Iron therapy and received IV feraheme 534m x 2 doses on 3/8 and 08/15/19. She received additional doses on 9/24 and 03/02/2020.  She had a hospital admission from 04/22/2021 until 04/24/2021 for epistaxis.  In clinic on 05/09/2019 the patient was found to have hemoglobin 6.3.  Transfusion was arranged for 2 units packed red blood cells on 05/09/2021.Transfusion was required again in Feb 2023 with admission. Additional 2 units of PRBC given on 10/14/2021.   #Iron Deficiency Anemia from Chronic Blood Loss --continue PO ferrous sulfide 3257mdaily with source of vitamin C. --source of blood loss previously identified as AVMs in the GI tract. Likely a strong contribution from the patient's sickle cell trait preventing her from reaching a normal Hgb. Baseline Hgb is  approximately 10.  --transfusion goal for Hgb <7.0. Hgb today at 5.9, providing 2 units of PRBC.  --patient received IV feraheme 51042m 2 dose on 9/24-10/06/2019.  --currently receiving 5 doses of IV iron sucrose. Dose 3 received today.  --RTC in 4 weeks with interval weekly lab checks.    #Sickle Cell Trait -- Hgb electrophoresis confirmed sickle cell trait on 07/27/2019. --like a strong contributing factor to the patients persistently low Hgb. Most likely her baseline hemoglobin is approximately 10.0   #Bleeding AVM/GI Bleeding --currently followed by [facility] --last EGD on 05/17/2019, showed esophagitis and small non-bleeding duodenal angiodysplasias.  -- Patient has establish care with [facility] and will be undergoing endoscopic intervention on 11/27/2021.  No orders of the defined types were placed in this encounter.  All questions were answered. The patient knows to call the clinic with any problems, questions or concerns.  A total of more than 30 minutes were spent on this encounter and over half of that time was spent on counseling and coordination of care as outlined above.   JohLedell PeoplesD Department of Hematology/Oncology [facility]: [phone]: [phone]: [email or web address]  11/12/2021 2:08 PM

## 2021-11-12 NOTE — Patient Instructions (Signed)

## 2021-11-14 ENCOUNTER — Encounter (HOSPITAL_COMMUNITY): Payer: Self-pay | Admitting: Internal Medicine

## 2021-11-14 ENCOUNTER — Other Ambulatory Visit: Payer: Self-pay

## 2021-11-19 ENCOUNTER — Inpatient Hospital Stay: Payer: Medicare HMO

## 2021-11-19 ENCOUNTER — Other Ambulatory Visit: Payer: Self-pay

## 2021-11-19 VITALS — BP 124/53 | HR 65 | Temp 98.2°F | Resp 17

## 2021-11-19 DIAGNOSIS — D500 Iron deficiency anemia secondary to blood loss (chronic): Secondary | ICD-10-CM

## 2021-11-19 MED ORDER — SODIUM CHLORIDE 0.9 % IV SOLN
200.0000 mg | Freq: Once | INTRAVENOUS | Status: AC
  Administered 2021-11-19: 200 mg via INTRAVENOUS
  Filled 2021-11-19: qty 200

## 2021-11-19 MED ORDER — SODIUM CHLORIDE 0.9 % IV SOLN: Freq: Once | INTRAVENOUS | Status: AC

## 2021-11-19 NOTE — Progress Notes (Signed)
Pt declined to stay for 30 minute observation period post Venofer infusion. VSS. No complaints at time of discharge.  

## 2021-11-19 NOTE — Patient Instructions (Signed)

## 2021-11-21 NOTE — Telephone Encounter (Addendum)
Called to Dr Randel Pigg office at [phone] and spoke with Dr Marlou Sa personally.  He stated that patient discontinued Plavix several months ago.  He stated that if she had restarted Plavix, it will be OK to hold Plavix.  I also called the patient to confirm whether or not she had restarted Plavix.  Patient stated she has not restarted Plavix recently.  Patient advised to continue holding Plavix until otherwise mentioned by a provider.  Patient agreed to plan and verbalized understanding.  No further questions.

## 2021-11-26 ENCOUNTER — Other Ambulatory Visit: Payer: Self-pay

## 2021-11-26 ENCOUNTER — Inpatient Hospital Stay: Payer: Medicare HMO

## 2021-11-26 VITALS — BP 112/51 | HR 64 | Temp 98.0°F | Resp 17

## 2021-11-26 DIAGNOSIS — D500 Iron deficiency anemia secondary to blood loss (chronic): Secondary | ICD-10-CM

## 2021-11-26 MED ORDER — SODIUM CHLORIDE 0.9 % IV SOLN
200.0000 mg | Freq: Once | INTRAVENOUS | Status: AC
  Administered 2021-11-26: 200 mg via INTRAVENOUS
  Filled 2021-11-26: qty 200

## 2021-11-26 MED ORDER — SODIUM CHLORIDE 0.9 % IV SOLN: Freq: Once | INTRAVENOUS | Status: AC

## 2021-11-27 ENCOUNTER — Encounter (HOSPITAL_COMMUNITY): Payer: Self-pay | Admitting: Internal Medicine

## 2021-11-27 ENCOUNTER — Ambulatory Visit (HOSPITAL_COMMUNITY): Payer: Medicare HMO | Admitting: Certified Registered"

## 2021-11-27 ENCOUNTER — Ambulatory Visit (HOSPITAL_BASED_OUTPATIENT_CLINIC_OR_DEPARTMENT_OTHER): Payer: Medicare HMO | Admitting: Certified Registered"

## 2021-11-27 ENCOUNTER — Other Ambulatory Visit: Payer: Self-pay

## 2021-11-27 ENCOUNTER — Encounter (HOSPITAL_COMMUNITY)
Admission: RE | Disposition: A | Discharge status: Home / Self Care | Payer: Self-pay | Source: Home / Self Care | Attending: Internal Medicine

## 2021-11-27 ENCOUNTER — Ambulatory Visit (HOSPITAL_COMMUNITY)
Admission: RE | Admit: 2021-11-27 | Discharge: 2021-11-27 | Disposition: A | Discharge status: Home / Self Care | Payer: Medicare HMO | Attending: Internal Medicine | Admitting: Internal Medicine

## 2021-11-27 DIAGNOSIS — Z87891 Personal history of nicotine dependence: Secondary | ICD-10-CM | POA: Diagnosis not present

## 2021-11-27 DIAGNOSIS — K31819 Angiodysplasia of stomach and duodenum without bleeding: Secondary | ICD-10-CM

## 2021-11-27 DIAGNOSIS — D649 Anemia, unspecified: Secondary | ICD-10-CM | POA: Insufficient documentation

## 2021-11-27 DIAGNOSIS — K31811 Angiodysplasia of stomach and duodenum with bleeding: Secondary | ICD-10-CM | POA: Insufficient documentation

## 2021-11-27 DIAGNOSIS — D122 Benign neoplasm of ascending colon: Secondary | ICD-10-CM | POA: Diagnosis not present

## 2021-11-27 DIAGNOSIS — J449 Chronic obstructive pulmonary disease, unspecified: Secondary | ICD-10-CM | POA: Insufficient documentation

## 2021-11-27 DIAGNOSIS — I129 Hypertensive chronic kidney disease with stage 1 through stage 4 chronic kidney disease, or unspecified chronic kidney disease: Secondary | ICD-10-CM | POA: Diagnosis not present

## 2021-11-27 DIAGNOSIS — E1122 Type 2 diabetes mellitus with diabetic chronic kidney disease: Secondary | ICD-10-CM | POA: Diagnosis not present

## 2021-11-27 DIAGNOSIS — N189 Chronic kidney disease, unspecified: Secondary | ICD-10-CM | POA: Diagnosis not present

## 2021-11-27 DIAGNOSIS — D123 Benign neoplasm of transverse colon: Secondary | ICD-10-CM | POA: Diagnosis not present

## 2021-11-27 DIAGNOSIS — K921 Melena: Secondary | ICD-10-CM | POA: Diagnosis present

## 2021-11-27 DIAGNOSIS — K5521 Angiodysplasia of colon with hemorrhage: Secondary | ICD-10-CM | POA: Insufficient documentation

## 2021-11-27 DIAGNOSIS — E1151 Type 2 diabetes mellitus with diabetic peripheral angiopathy without gangrene: Secondary | ICD-10-CM | POA: Insufficient documentation

## 2021-11-27 DIAGNOSIS — K2970 Gastritis, unspecified, without bleeding: Secondary | ICD-10-CM | POA: Insufficient documentation

## 2021-11-27 DIAGNOSIS — K5730 Diverticulosis of large intestine without perforation or abscess without bleeding: Secondary | ICD-10-CM | POA: Diagnosis not present

## 2021-11-27 DIAGNOSIS — K209 Esophagitis, unspecified without bleeding: Secondary | ICD-10-CM | POA: Diagnosis not present

## 2021-11-27 DIAGNOSIS — D509 Iron deficiency anemia, unspecified: Secondary | ICD-10-CM | POA: Insufficient documentation

## 2021-11-27 DIAGNOSIS — K2100 Gastro-esophageal reflux disease with esophagitis, without bleeding: Secondary | ICD-10-CM | POA: Diagnosis not present

## 2021-11-27 DIAGNOSIS — K648 Other hemorrhoids: Secondary | ICD-10-CM | POA: Diagnosis not present

## 2021-11-27 DIAGNOSIS — K635 Polyp of colon: Secondary | ICD-10-CM | POA: Diagnosis not present

## 2021-11-27 DIAGNOSIS — E039 Hypothyroidism, unspecified: Secondary | ICD-10-CM | POA: Insufficient documentation

## 2021-11-27 DIAGNOSIS — K5520 Angiodysplasia of colon without hemorrhage: Secondary | ICD-10-CM

## 2021-11-27 DIAGNOSIS — D759 Disease of blood and blood-forming organs, unspecified: Secondary | ICD-10-CM | POA: Insufficient documentation

## 2021-11-27 DIAGNOSIS — A630 Anogenital (venereal) warts: Secondary | ICD-10-CM

## 2021-11-27 DIAGNOSIS — K5900 Constipation, unspecified: Secondary | ICD-10-CM | POA: Insufficient documentation

## 2021-11-27 HISTORY — PX: POLYPECTOMY: SHX5525

## 2021-11-27 HISTORY — PX: COLONOSCOPY: SHX5424

## 2021-11-27 HISTORY — PX: HOT HEMOSTASIS: SHX5433

## 2021-11-27 HISTORY — PX: ENTEROSCOPY: SHX5533

## 2021-11-27 HISTORY — DX: Presence of dental prosthetic device (complete) (partial): Z97.2

## 2021-11-27 HISTORY — DX: Presence of spectacles and contact lenses: Z97.3

## 2021-11-27 LAB — GLUCOSE, CAPILLARY: Glucose-Capillary: 128 mg/dL — ABNORMAL HIGH (ref 70–99)

## 2021-11-27 SURGERY — ENTEROSCOPY
Anesthesia: Monitor Anesthesia Care

## 2021-11-27 MED ORDER — PROPOFOL 500 MG/50ML IV EMUL
INTRAVENOUS | Status: DC | PRN
  Administered 2021-11-27: 125 ug/kg/min via INTRAVENOUS

## 2021-11-27 MED ORDER — SODIUM CHLORIDE 0.9 % IV SOLN: INTRAVENOUS | Status: DC

## 2021-11-27 MED ORDER — PROPOFOL 10 MG/ML IV BOLUS
INTRAVENOUS | Status: DC | PRN
  Administered 2021-11-27 (×4): 40 mg via INTRAVENOUS

## 2021-11-27 MED ORDER — LACTATED RINGERS IV SOLN: INTRAVENOUS | Status: DC | PRN

## 2021-11-27 MED ORDER — GLUCAGON HCL RDNA (DIAGNOSTIC) 1 MG IJ SOLR
INTRAMUSCULAR | Status: DC | PRN
  Administered 2021-11-27: .25 mg via INTRAVENOUS

## 2021-11-27 MED ORDER — PANTOPRAZOLE SODIUM 40 MG PO TBEC: 40.0000 mg | DELAYED_RELEASE_TABLET | Freq: Two times a day (BID) | ORAL | 1 refills | Status: DC

## 2021-11-27 MED ORDER — PHENYLEPHRINE HCL (PRESSORS) 10 MG/ML IV SOLN
INTRAVENOUS | Status: AC
  Filled 2021-11-27: qty 1

## 2021-11-27 MED ORDER — GLUCAGON HCL RDNA (DIAGNOSTIC) 1 MG IJ SOLR
INTRAMUSCULAR | Status: AC
  Filled 2021-11-27: qty 1

## 2021-11-27 MED ORDER — PHENYLEPHRINE HCL-NACL 20-0.9 MG/250ML-% IV SOLN
INTRAVENOUS | Status: DC | PRN
  Administered 2021-11-27: 25 ug/min via INTRAVENOUS

## 2021-11-27 MED ORDER — LACTATED RINGERS IV SOLN: INTRAVENOUS | Status: DC

## 2021-11-27 MED ORDER — LIDOCAINE 2% (20 MG/ML) 5 ML SYRINGE
INTRAMUSCULAR | Status: DC | PRN
  Administered 2021-11-27: 40 mg via INTRAVENOUS

## 2021-11-27 NOTE — H&P (Signed)
GASTROENTEROLOGY PROCEDURE H&P NOTE   Primary Care Physician: Rogers Blocker, MD    Reason for Procedure:   Melena, anemia  Plan:    SBE/colonoscopy  Patient is appropriate for endoscopic procedure(s) in the hospital setting.  The nature of the procedure, as well as the risks, benefits, and alternatives were carefully and thoroughly reviewed with the patient. Ample time for discussion and questions allowed. The patient understood, was satisfied, and agreed to proceed.     HPI: Lacey Vega is a 71 y.o. female who presents for SBE/colonoscopy for evaluation of melena and anemia.  Patient was most recently seen in the Gastroenterology Clinic on 11/08/21.  No interval change in medical history since that appointment. Please refer to that note for full details regarding GI history and clinical presentation.   Past Medical History:  Diagnosis Date   Anemia    Anxiety    Asthma    Asthma    Bronchitis    hx   Colon polyps    hyperplastic   Diabetes (HCC)    mild, history of   DJD (degenerative joint disease), cervical    Dysphagia    history of   Dyspnea    Gallstones    GERD (gastroesophageal reflux disease)    H/O chest pain    secondary to anemia   Headache    History of arteriovenous malformation (AVM)    History of blood transfusion 10/2021   History of hiatal hernia    History of tobacco use    Hyperlipemia    Hypertension    Hypothyroidism    Iron deficiency anemia    Sickle cell anemia ([place])    "patient is not aware of this"   Thyroid disease    TIA (transient ischemic attack)    "was told that she possible had one"   Wears dentures    Wears glasses     Past Surgical History:  Procedure Laterality Date   ABDOMINAL AORTIC ENDOVASCULAR STENT GRAFT Bilateral 01/31/2021   Procedure: ENDOVASCULAR ANEURYSM REPAIR (EVAR)Bilateral Groin Cutdown, left femoral endaterectomy with bovine patch angioplasty.;  Surgeon: Cherre Robins, MD;  Location: [facility];   Service: Vascular;  Laterality: Bilateral;   ABDOMINAL HYSTERECTOMY     ANTERIOR CERVICAL DECOMP/DISCECTOMY FUSION N/A 10/03/2016   Procedure: ANTERIOR CERVICAL DECOMPRESSION/DISCECTOMY FUSION CERVICAL THREE- CERVICAL FOUR;  Surgeon: Ashok Pall, MD;  Location: [place];  Service: Neurosurgery;  Laterality: N/A;  Right side approach   BOTOX INJECTION  08/29/2015   Procedure: BOTOX INJECTION;  Surgeon: Wilford Corner, MD;  Location: [place];  Service: Endoscopy;;   CHOLECYSTECTOMY     COLONOSCOPY  12/03/2011   Procedure: COLONOSCOPY;  Surgeon: [name], MD;  Location: [facility];  Service: Endoscopy;  Laterality: N/A;   ENTEROSCOPY N/A 07/07/2021   Procedure: ENTEROSCOPY;  Surgeon: Clarene Essex, MD;  Location: [facility];  Service: Endoscopy;  Laterality: N/A;   ENTEROSCOPY N/A 08/06/2021   Procedure: ENTEROSCOPY;  Surgeon: Ronnette Juniper, MD;  Location: [facility];  Service: Gastroenterology;  Laterality: N/A;   ESOPHAGEAL MANOMETRY N/A 07/13/2017   Procedure: ESOPHAGEAL MANOMETRY (EM);  Surgeon: Wilford Corner, MD;  Location: [facility];  Service: Endoscopy;  Laterality: N/A;   ESOPHAGOGASTRODUODENOSCOPY  12/03/2011   Procedure: ESOPHAGOGASTRODUODENOSCOPY (EGD);  Surgeon: [name], MD;  Location: [facility];  Service: Endoscopy;  Laterality: N/A;   ESOPHAGOGASTRODUODENOSCOPY (EGD) WITH PROPOFOL N/A 08/29/2015   Procedure: ESOPHAGOGASTRODUODENOSCOPY (EGD) WITH PROPOFOL;  Surgeon: Wilford Corner, MD;  Location: [place];  Service: Endoscopy;  Laterality: N/A;   ESOPHAGOGASTRODUODENOSCOPY (EGD) WITH PROPOFOL N/A 05/17/2019   Procedure: ESOPHAGOGASTRODUODENOSCOPY (EGD) WITH PROPOFOL;  Surgeon: Wilford Corner, MD;  Location: [facility];  Service: Endoscopy;  Laterality: N/A;   ESOPHAGOGASTRODUODENOSCOPY (EGD) WITH PROPOFOL N/A 04/24/2021   Procedure: ESOPHAGOGASTRODUODENOSCOPY (EGD) WITH PROPOFOL;  Surgeon: Otis Brace, MD;  Location: [place];  Service:  Gastroenterology;  Laterality: N/A;   GIVENS CAPSULE STUDY N/A 08/05/2018   Procedure: GIVENS CAPSULE STUDY;  Surgeon: Wilford Corner, MD;  Location: [place];  Service: Endoscopy;  Laterality: N/A;   GIVENS CAPSULE STUDY N/A 07/06/2021   Procedure: GIVENS CAPSULE STUDY;  Surgeon: Clarene Essex, MD;  [facility];  Service: Endoscopy;  Laterality: N/A;   HOT HEMOSTASIS  12/03/2011   Procedure: HOT HEMOSTASIS (ARGON PLASMA COAGULATION/BICAP);  Surgeon: [name], MD;  Location: [facility];  Service: Endoscopy;  Laterality: N/A;   HOT HEMOSTASIS N/A 07/07/2021   Procedure: HOT HEMOSTASIS (ARGON PLASMA COAGULATION/BICAP);  Surgeon: Clarene Essex, MD;  Location: [facility];  Service: Endoscopy;  Laterality: N/A;   HOT HEMOSTASIS N/A 08/06/2021   Procedure: HOT HEMOSTASIS (ARGON PLASMA COAGULATION/BICAP);  Surgeon: Ronnette Juniper, MD;  Location: [facility];  Service: Gastroenterology;  Laterality: N/A;   NECK SURGERY     PERIPHERAL VASCULAR INTERVENTION  03/29/2021   Procedure: PERIPHERAL VASCULAR INTERVENTION;  Surgeon: Cherre Robins, MD;  Location: [place] [facility];  Service: Cardiovascular;;  Lt SFA Brachial Approach   ROTATOR CUFF REPAIR     right   ROTATOR CUFF REPAIR Right 2014   THROMBECTOMY FEMORAL ARTERY Left 01/31/2021   Procedure: THROMBECTOMY POPLITEAL  ARTERY;  Surgeon: Cherre Robins, MD;  Location[facility];  Service: Vascular;  Laterality: Left;   TONSILLECTOMY      Prior to Admission medications   Medication Sig Start Date End Date Taking? Authorizing Provider  atorvastatin (LIPITOR) 40 MG tablet Take 40 mg by mouth at bedtime.   Yes [provider]  carisoprodol (SOMA) 250 MG tablet Take 250 mg by mouth 2 (two) times daily as needed (cramps).  05/04/19  Yes [provider]  Cinnamon 500 MG TABS Take 1,000 mg by mouth every evening.   Yes [provider]  donepezil (ARICEPT) 5 MG tablet Take 5 mg by mouth at bedtime.   Yes [provider]  ferrous sulfate 325 (65 FE) MG tablet Take 1 tablet (325 mg total) by mouth daily with breakfast. with orange juice 09/20/21  Yes Orson Slick, MD  folic acid (FOLVITE) 967 MCG tablet Take 800 mcg by mouth daily.   Yes [provider]  furosemide (LASIX) 20 MG tablet Take 20 mg by mouth daily.    Yes [provider]  levothyroxine (SYNTHROID) 100 MCG tablet Take 100 mcg by mouth daily. 07/31/21  Yes [provider]  linaclotide Rolan Lipa) 145 MCG CAPS capsule Take 1 capsule (145 mcg total) by mouth daily before breakfast. 11/08/21  Yes Sharyn Creamer, MD  montelukast (SINGULAIR) 10 MG tablet Take 10 mg by mouth every evening.   Yes [provider]  Multiple Vitamins-Minerals (HAIR/SKIN/NAILS) CAPS Take 5,000 mcg by mouth daily.   Yes [provider]  Omega-3 Fatty Acids (FISH OIL BURP-LESS) 1200 MG CAPS Take 1,000 mg by mouth every evening.   Yes [provider]  pantoprazole (PROTONIX) 40 MG tablet Take 1 tablet (40 mg total) by mouth daily before breakfast. 11/08/21  Yes Sharyn Creamer, MD  vitamin B-12 (CYANOCOBALAMIN) 1000  MCG tablet Take 1,000 mcg by mouth daily.   Yes [provider]  acetaminophen (TYLENOL) 500 MG tablet Take 500-1,000 mg by mouth every 6 (six) hours as needed for mild pain or headache.    [provider]  albuterol (VENTOLIN HFA) 108 (90 Base) MCG/ACT inhaler Inhale 1-2 puffs into the lungs every 6 (six) hours as needed for wheezing or shortness of breath.    [provider]  aspirin EC 81 MG tablet Take 1 tablet (81 mg total) by mouth daily. **Start on 11/25** Patient taking differently: Take 81 mg by mouth daily. 04/26/21   Dessa Phi, DO  clopidogrel (PLAVIX) 75 MG tablet Take 1 tablet (75 mg total) by mouth daily. **Start on 11/25** Patient not taking: Reported on 11/08/2021 04/26/21 04/26/22  Dessa Phi, DO  Magnesium Oxide 420 (252 Mg) MG TABS Take 420-840 mg by mouth as  needed (for cramps).    [provider]  Na Sulfate-K Sulfate-Mg Sulf (SUPREP BOWEL PREP KIT) 17.5-3.13-1.6 GM/177ML SOLN Take 1 kit by mouth as directed. 11/08/21   Sharyn Creamer, MD  nitroGLYCERIN (NITROSTAT) 0.4 MG SL tablet Place 0.4 mg under the tongue every 5 (five) minutes as needed for chest pain.    [provider]  octreotide (SANDOSTATIN LAR DEPOT) 10 MG injection Inject 10 mg into the muscle every 28 (twenty-eight) days. 11/08/21   Sharyn Creamer, MD    Current Facility-Administered Medications  Medication Dose Route Frequency Provider Last Rate Last Admin   0.9 %  sodium chloride infusion   Intravenous Continuous Sharyn Creamer, MD       lactated ringers infusion   Intravenous Continuous Sharyn Creamer, MD 10 mL/hr at 11/27/21 0745 New Bag at 11/27/21 0745   Facility-Administered Medications Ordered in Other Encounters  Medication Dose Route Frequency Provider Last Rate Last Admin   0.9 %  sodium chloride infusion   Intravenous Continuous Wilford Corner, MD        Allergies as of 11/08/2021 - Review Complete 11/08/2021  Allergen Reaction Noted   Oxycodone Shortness Of Breath 03/12/2012    Family History  Problem Relation Age of Onset   Diabetes Mother    Heart attack Brother    Cancer Maternal Aunt     Social History   Socioeconomic History   Marital status: Divorced    Spouse name: Not on file   Number of children: 2   Years of education: Not on file   Highest education level: Not on file  Occupational History   Occupation: group leader environmental services    Employer: [employer]    Comment: [employer]  Tobacco Use   Smoking status: Former    Types: Cigarettes    Quit date: 06/02/2000    Years since quitting: 21.5   Smokeless tobacco: Never  Vaping Use   Vaping Use: Never used  Substance and Sexual Activity   Alcohol use: No   Drug use: No   Sexual activity: Not on file  Other Topics Concern   Not on file  Social  History Narrative   ** Merged History Encounter **       Social Determinants of Health   Financial Resource Strain: Not on file  Food Insecurity: Not on file  Transportation Needs: Not on file  Physical Activity: Not on file  Stress: Not on file  Social Connections: Not on file  Intimate Partner Violence: Not on file    Physical Exam: Vital signs  in last 24 hours: BP 116/74   Pulse 86   Temp (!) 97 F (36.1 C) (Temporal)   Resp (!) 21   Ht 5' 5"  (1.651 m)   Wt 84.4 kg   SpO2 99%   BMI 30.95 kg/m  GEN: NAD EYE: Sclerae anicteric ENT: MMM CV: Non-tachycardic Pulm: No increased WOB GI: Soft NEURO:  Alert & Oriented   Christia Reading, MD [facility]   11/27/2021 8:16 AM

## 2021-11-27 NOTE — Anesthesia Postprocedure Evaluation (Signed)
Anesthesia Post Note  Patient: [place]  Procedure(s) Performed: SMALL  BOWEL ENTEROSCOPY COLONOSCOPY HOT HEMOSTASIS (ARGON PLASMA COAGULATION/BICAP) POLYPECTOMY     Patient location during evaluation: Endoscopy Anesthesia Type: MAC Level of consciousness: awake and alert Pain management: pain level controlled Vital Signs Assessment: post-procedure vital signs reviewed and stable Respiratory status: spontaneous breathing, nonlabored ventilation and respiratory function stable Cardiovascular status: blood pressure returned to baseline and stable Postop Assessment: no apparent nausea or vomiting Anesthetic complications: no   No notable events documented.  Last Vitals:  Vitals:   11/27/21 1050 11/27/21 1100  BP: (!) 124/58 112/67  Pulse: 72 74  Resp: (!) 21 (!) 22  Temp:    SpO2: 92% 96%    Last Pain:  Vitals:   11/27/21 1100  TempSrc:   PainSc: 0-No pain                 Lidia Collum

## 2021-11-27 NOTE — Anesthesia Procedure Notes (Signed)
Date/Time: 11/27/2021 8:40 AM  Performed by: Cynda Familia, CRNAPre-anesthesia Checklist: Patient identified, Emergency Drugs available, Suction available, Patient being monitored and Timeout performed Patient Re-evaluated:Patient Re-evaluated prior to induction Oxygen Delivery Method: Simple face mask Placement Confirmation: positive ETCO2 and breath sounds checked- equal and bilateral Dental Injury: Teeth and Oropharynx as per pre-operative assessment  Comments: Bite block by RN

## 2021-11-27 NOTE — Op Note (Signed)
[facility] Patient Name: Lacey Vega Procedure Date: 11/27/2021 MRN: [id number] Attending MD: Georgian Co ,  Date of Birth: [birth year] CSN: [id number] Age: 71 Admit Type: Outpatient Procedure:                Small bowel enteroscopy Indications:              Iron deficiency anemia, Melena Providers:                Adline Mango" Remus Blake, RN, William Dalton, Technician, Darliss Cheney, Technician Referring MD:             Ledell Peoples Iv Medicines:                Monitored Anesthesia Care Complications:            No immediate complications. Estimated Blood Loss:     Estimated blood loss was minimal. Procedure:                Pre-Anesthesia Assessment:                           - Prior to the procedure, a History and Physical                            was performed, and patient medications and                            allergies were reviewed. The patient's tolerance of                            previous anesthesia was also reviewed. The risks                            and benefits of the procedure and the sedation                            options and risks were discussed with the patient.                            All questions were answered, and informed consent                            was obtained. Prior Anticoagulants: The patient has                            taken no previous anticoagulant or antiplatelet                            agents. ASA Grade Assessment: III - A patient with                            severe systemic disease. After reviewing the risks  and benefits, the patient was deemed in                            satisfactory condition to undergo the procedure.                           After obtaining informed consent, the endoscope was                            passed under direct vision. Throughout the                            procedure, the patient's blood  pressure, pulse, and                            oxygen saturations were monitored continuously. The                            [id number]-HQ190L ([id number]) Olympus colonoscope was                            introduced through the mouth and advanced to the                            second part of duodenum. The small bowel                            enteroscopy was accomplished without difficulty.                            The patient tolerated the procedure well. Scope In: Scope Out: Findings:      LA Grade A (one or more mucosal breaks less than 5 mm, not extending       between tops of 2 mucosal folds) esophagitis with no bleeding was found       in the distal esophagus.      Localized mild inflammation characterized by congestion (edema) and       erythema was found in the gastric body and in the gastric antrum.      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 Coagulation for bleeding       prevention using argon plasma at 0.8 liters/minute and 20 watts was       successful.      Two angioectasias with no bleeding were found in the proximal jejunum.       Coagulation for bleeding prevention using argon plasma at 0.8       liters/minute and 20 watts was successful. Impression:               - LA Grade A esophagitis with no bleeding.                           - Mild gastritis.                           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                            Treated with argon plasma coagulation (APC).                           - Two non-bleeding angioectasias in the jejunum.                            Treated with argon plasma coagulation (APC).                           - No specimens collected. Moderate Sedation:      Not Applicable - Patient had care per Anesthesia. Recommendation:           - Await pathology  results.                           - Use Protonix (pantoprazole) 40 mg PO BID for 8                            weeks, then decrease to QD.                           - Continue octreotide therapy.                           - Check serum H pylori antibody.                           - Perform a colonoscopy today. Procedure Code(s):        --- Professional ---                           [phone], Esophagogastroduodenoscopy, flexible,                            transoral; with control of bleeding, any method Diagnosis Code(s):        --- Professional ---                           K20.90, Esophagitis, unspecified without bleeding                           K29.70, Gastritis, unspecified, without bleeding                           K31.819, Angiodysplasia of stomach and duodenum                            without bleeding                           K55.20, Angiodysplasia of colon without hemorrhage                           D50.9, Iron deficiency anemia, unspecified  K92.1, Melena (includes Hematochezia) CPT copyright 2019 American Medical Association. All rights reserved. The codes documented in this report are preliminary and upon coder review may  be revised to meet current compliance requirements. Adline Mango" [place],  11/27/2021 10:09:52 AM Number of Addenda: 0

## 2021-11-27 NOTE — Anesthesia Preprocedure Evaluation (Addendum)
Anesthesia Evaluation  Patient identified by MRN, date of birth, ID band Patient awake    Reviewed: Allergy & Precautions, NPO status , Patient's Chart, lab work & pertinent test results  History of Anesthesia Complications Negative for: history of anesthetic complications  Airway Mallampati: II  TM Distance: >3 FB Neck ROM: Full    Dental  (+) Edentulous Upper, Edentulous Lower   Pulmonary asthma , COPD, former smoker,    Pulmonary exam normal        Cardiovascular hypertension, + Peripheral Vascular Disease  Normal cardiovascular exam     Neuro/Psych TIA   GI/Hepatic Neg liver ROS, hiatal hernia, GERD  ,  Endo/Other  diabetesHypothyroidism   Renal/GU CRFRenal disease  negative genitourinary   Musculoskeletal negative musculoskeletal ROS (+)   Abdominal   Peds  Hematology  (+) Blood dyscrasia, anemia ,   Anesthesia Other Findings   Reproductive/Obstetrics                            Anesthesia Physical Anesthesia Plan  ASA: 3  Anesthesia Plan: MAC   Post-op Pain Management: Minimal or no pain anticipated   Induction: Intravenous  PONV Risk Score and Plan: 2 and Propofol infusion, TIVA and Treatment may vary due to age or medical condition  Airway Management Planned: Natural Airway, Nasal Cannula and Simple Face Mask  Additional Equipment: None  Intra-op Plan:   Post-operative Plan:   Informed Consent: I have reviewed the patients History and Physical, chart, labs and discussed the procedure including the risks, benefits and alternatives for the proposed anesthesia with the patient or authorized representative who has indicated his/her understanding and acceptance.       Plan Discussed with:   Anesthesia Plan Comments:         Anesthesia Quick Evaluation

## 2021-11-27 NOTE — Transfer of Care (Signed)
Immediate Anesthesia Transfer of Care Note  Patient: [place]  Procedure(s) Performed: SMALL  BOWEL ENTEROSCOPY COLONOSCOPY HOT HEMOSTASIS (ARGON PLASMA COAGULATION/BICAP) POLYPECTOMY  Patient Location: PACU and Endoscopy Unit  Anesthesia Type:MAC  Level of Consciousness: awake and alert   Airway & Oxygen Therapy: Patient Spontanous Breathing and Patient connected to face mask oxygen  Post-op Assessment: Report given to RN and Post -op Vital signs reviewed and stable  Post vital signs: Reviewed and stable  Last Vitals:  Vitals Value Taken Time  BP 108/84 11/27/21 1011  Temp    Pulse 61 11/27/21 1012  Resp 17 11/27/21 1014  SpO2 82 % 11/27/21 1012  Vitals shown include unvalidated device data.  Last Pain:  Vitals:   11/27/21 0728  TempSrc: Temporal  PainSc: 0-No pain         Complications: No notable events documented.

## 2021-11-27 NOTE — Op Note (Signed)
[facility] Patient Name: Lacey Vega Procedure Date: 11/27/2021 MRN: [id number] Attending MD: Georgian Co ,  Date of Birth: 11/07/1950 CSN: [id number] Age: 71 Admit Type: Outpatient Procedure:                Colonoscopy Indications:              Melena, Iron deficiency anemia Providers:                Donato Schultz, RN, William Dalton, Technician, Darliss Cheney, Technician Referring MD:             Ledell Peoples Iv Medicines:                Monitored Anesthesia Care Complications:            No immediate complications. Estimated Blood Loss:     Estimated blood loss was minimal. Procedure:                Pre-Anesthesia Assessment:                           - Prior to the procedure, a History and Physical                            was performed, and patient medications and                            allergies were reviewed. The patient's tolerance of                            previous anesthesia was also reviewed. The risks                            and benefits of the procedure and the sedation                            options and risks were discussed with the patient.                            All questions were answered, and informed consent                            was obtained. Prior Anticoagulants: The patient has                            taken no previous anticoagulant or antiplatelet                            agents. ASA Grade Assessment: III - A patient with                            severe systemic disease. After reviewing the risks  and benefits, the patient was deemed in                            satisfactory condition to undergo the procedure.                           After obtaining informed consent, the colonoscope                            was passed under direct vision. Throughout the                            procedure, the patient's blood pressure,  pulse, and                            oxygen saturations were monitored continuously. The                            PCF-HQ190L ([id number] Olympus colonoscope was                            introduced through the anus and advanced to the the                            terminal ileum. The colonoscopy was performed                            without difficulty. The patient tolerated the                            procedure well. The quality of the bowel                            preparation was adequate. The terminal ileum,                            ileocecal valve, appendiceal orifice, and rectum                            were photographed. Rectal retroflexion was not                            performed due to limited mobility within the rectal                            vault. Scope In: 9:41:07 AM Scope Out: 10:03:55 AM Scope Withdrawal Time: 0 hours 18 minutes 10 seconds  Total Procedure Duration: 0 hours 22 minutes 48 seconds  Findings:      The terminal ileum appeared normal.      Three sessile polyps were found in the transverse colon and ascending       colon. The polyps were 3 to 4 mm in size. These polyps were removed with       a cold snare. Resection and retrieval were complete.      A  single small localized angioectasia without bleeding was found in the       ascending colon. Coagulation for bleeding prevention using argon plasma       at 0.8 liters/minute and 20 watts was successful.      A few diverticula were found in the sigmoid colon, descending colon,       transverse colon and ascending colon.      Non-bleeding internal hemorrhoids were found during retroflexion.      Anorectal wart was noted on rectal exam. Impression:               - The examined portion of the ileum was normal.                           - Three 3 to 4 mm polyps in the transverse colon                            and in the ascending colon, removed with a cold                            snare.  Resected and retrieved.                           - A single non-bleeding colonic angioectasia.                            Treated with argon plasma coagulation (APC).                           - Diverticulosis in the sigmoid colon, in the                            descending colon, in the transverse colon and in                            the ascending colon.                           - Non-bleeding internal hemorrhoids.                           - Anal wart. Moderate Sedation:      Not Applicable - Patient had care per Anesthesia. Recommendation:           - Discharge patient to home (with escort).                           - Await pathology results.                           - Will place referral to colorectal surgery                            regarding anal wart seen on perianal exam.                           - The  findings and recommendations were discussed                            with the patient.                           - Return to GI clinic in 6 weeks. Procedure Code(s):        --- Professional ---                           [phone], 59, Colonoscopy, flexible; with control of                            bleeding, any method                           45385, Colonoscopy, flexible; with removal of                            tumor(s), polyp(s), or other lesion(s) by snare                            technique Diagnosis Code(s):        --- Professional ---                           K64.8, Other hemorrhoids                           K63.5, Polyp of colon                           K55.20, Angiodysplasia of colon without hemorrhage                           K92.1, Melena (includes Hematochezia)                           D50.9, Iron deficiency anemia, unspecified                           K57.30, Diverticulosis of large intestine without                            perforation or abscess without bleeding CPT copyright 2019 [employer]. All rights reserved. The codes  documented in this report are preliminary and upon coder review may  be revised to meet current compliance requirements. Lacey Vega "Lacey Vega,  11/27/2021 10:16:55 AM Number of Addenda: 0

## 2021-11-27 NOTE — Discharge Instructions (Signed)
YOU HAD AN ENDOSCOPIC PROCEDURE TODAY: Refer to the procedure report and other information in the discharge instructions given to you for any specific questions about what was found during the examination. If this information does not answer your questions, please call [place] office at [phone] to clarify.  ° °YOU SHOULD EXPECT: Some feelings of bloating in the abdomen. Passage of more gas than usual. Walking can help get rid of the air that was put into your GI tract during the procedure and reduce the bloating. If you had a lower endoscopy (such as a colonoscopy or flexible sigmoidoscopy) you may notice spotting of blood in your stool or on the toilet paper. Some abdominal soreness may be present for a day or two, also. ° °DIET: Your first meal following the procedure should be a light meal and then it is ok to progress to your normal diet. A half-sandwich or bowl of soup is an example of a good first meal. Heavy or fried foods are harder to digest and may make you feel nauseous or bloated. Drink plenty of fluids but you should avoid alcoholic beverages for 24 hours. If you had a esophageal dilation, please see attached instructions for diet.   ° °ACTIVITY: Your care partner should take you home directly after the procedure. You should plan to take it easy, moving slowly for the rest of the day. You can resume normal activity the day after the procedure however YOU SHOULD NOT DRIVE, use power tools, machinery or perform tasks that involve climbing or major physical exertion for 24 hours (because of the sedation medicines used during the test).  ° °SYMPTOMS TO REPORT IMMEDIATELY: °A gastroenterologist can be reached at any hour. Please call [phone]  for any of the following symptoms:  °Following lower endoscopy (colonoscopy, flexible sigmoidoscopy) °Excessive amounts of blood in the stool  °Significant tenderness, worsening of abdominal pains  °Swelling of the abdomen that is new, acute  °Fever of 100° or  higher  °Following upper endoscopy (EGD, EUS, ERCP, esophageal dilation) °Vomiting of blood or coffee ground material  °New, significant abdominal pain  °New, significant chest pain or pain under the shoulder blades  °Painful or persistently difficult swallowing  °New shortness of breath  °Black, tarry-looking or red, bloody stools ° °FOLLOW UP:  °If any biopsies were taken you will be contacted by phone or by letter within the next 1-3 weeks. Call [phone]  if you have not heard about the biopsies in 3 weeks.  °Please also call with any specific questions about appointments or follow up tests. ° °

## 2021-11-28 LAB — H. PYLORI ANTIBODY, IGG: H Pylori IgG: 0.38 Index Value (ref 0.00–0.79)

## 2021-11-28 LAB — SURGICAL PATHOLOGY

## 2021-12-15 ENCOUNTER — Other Ambulatory Visit: Payer: Self-pay | Admitting: Hematology and Oncology

## 2021-12-15 DIAGNOSIS — D500 Iron deficiency anemia secondary to blood loss (chronic): Secondary | ICD-10-CM

## 2021-12-16 ENCOUNTER — Inpatient Hospital Stay (HOSPITAL_BASED_OUTPATIENT_CLINIC_OR_DEPARTMENT_OTHER): Payer: Medicare HMO | Admitting: Hematology and Oncology

## 2021-12-16 ENCOUNTER — Inpatient Hospital Stay: Payer: Medicare HMO | Attending: Hematology and Oncology

## 2021-12-16 ENCOUNTER — Other Ambulatory Visit: Payer: Self-pay

## 2021-12-16 VITALS — BP 124/62 | HR 85 | Temp 97.3°F | Resp 16 | Wt 179.2 lb

## 2021-12-16 DIAGNOSIS — Z809 Family history of malignant neoplasm, unspecified: Secondary | ICD-10-CM | POA: Insufficient documentation

## 2021-12-16 DIAGNOSIS — K922 Gastrointestinal hemorrhage, unspecified: Secondary | ICD-10-CM | POA: Diagnosis not present

## 2021-12-16 DIAGNOSIS — Z87891 Personal history of nicotine dependence: Secondary | ICD-10-CM | POA: Diagnosis not present

## 2021-12-16 DIAGNOSIS — I10 Essential (primary) hypertension: Secondary | ICD-10-CM | POA: Insufficient documentation

## 2021-12-16 DIAGNOSIS — D500 Iron deficiency anemia secondary to blood loss (chronic): Secondary | ICD-10-CM | POA: Insufficient documentation

## 2021-12-16 DIAGNOSIS — D573 Sickle-cell trait: Secondary | ICD-10-CM | POA: Diagnosis not present

## 2021-12-16 DIAGNOSIS — E119 Type 2 diabetes mellitus without complications: Secondary | ICD-10-CM | POA: Diagnosis not present

## 2021-12-16 LAB — CMP (CANCER CENTER ONLY)
ALT: 12 U/L (ref 0–44)
AST: 19 U/L (ref 15–41)
Albumin: 4 g/dL (ref 3.5–5.0)
Alkaline Phosphatase: 103 U/L (ref 38–126)
Anion gap: 7 (ref 5–15)
BUN: 12 mg/dL (ref 8–23)
CO2: 34 mmol/L — ABNORMAL HIGH (ref 22–32)
Calcium: 9.3 mg/dL (ref 8.9–10.3)
Chloride: 103 mmol/L (ref 98–111)
Creatinine: 1.06 mg/dL — ABNORMAL HIGH (ref 0.44–1.00)
GFR, Estimated: 56 mL/min — ABNORMAL LOW (ref >60)
Glucose, Bld: 123 mg/dL — ABNORMAL HIGH (ref 70–99)
Potassium: 2.8 mmol/L — ABNORMAL LOW (ref 3.5–5.1)
Sodium: 144 mmol/L (ref 135–145)
Total Bilirubin: 0.9 mg/dL (ref 0.3–1.2)
Total Protein: 7.2 g/dL (ref 6.5–8.1)

## 2021-12-16 LAB — CBC WITH DIFFERENTIAL (CANCER CENTER ONLY)
Abs Immature Granulocytes: 0.01 10*3/uL (ref 0.00–0.07)
Basophils Absolute: 0 10*3/uL (ref 0.0–0.1)
Basophils Relative: 1 %
Eosinophils Absolute: 0.2 10*3/uL (ref 0.0–0.5)
Eosinophils Relative: 4 %
HCT: 31.3 % — ABNORMAL LOW (ref 36.0–46.0)
Hemoglobin: 9.9 g/dL — ABNORMAL LOW (ref 12.0–15.0)
Immature Granulocytes: 0 %
Lymphocytes Relative: 18 %
Lymphs Abs: 1 10*3/uL (ref 0.7–4.0)
MCH: 25.8 pg — ABNORMAL LOW (ref 26.0–34.0)
MCHC: 31.6 g/dL (ref 30.0–36.0)
MCV: 81.7 fL (ref 80.0–100.0)
Monocytes Absolute: 0.5 10*3/uL (ref 0.1–1.0)
Monocytes Relative: 9 %
Neutro Abs: 3.9 10*3/uL (ref 1.7–7.7)
Neutrophils Relative %: 68 %
Platelet Count: 265 10*3/uL (ref 150–400)
RBC: 3.83 MIL/uL — ABNORMAL LOW (ref 3.87–5.11)
RDW: 15.2 % (ref 11.5–15.5)
WBC Count: 5.7 10*3/uL (ref 4.0–10.5)
nRBC: 0 % (ref 0.0–0.2)

## 2021-12-16 LAB — RETIC PANEL
Immature Retic Fract: 18.3 % — ABNORMAL HIGH (ref 2.3–15.9)
RBC.: 3.88 MIL/uL (ref 3.87–5.11)
Retic Count, Absolute: 44.6 10*3/uL (ref 19.0–186.0)
Retic Ct Pct: 1.2 % (ref 0.4–3.1)
Reticulocyte Hemoglobin: 27.5 pg — ABNORMAL LOW (ref >27.9)

## 2021-12-16 LAB — IRON AND IRON BINDING CAPACITY (CC-WL,HP ONLY)
Iron: 51 ug/dL (ref 28–170)
Saturation Ratios: 17 % (ref 10.4–31.8)
TIBC: 302 ug/dL (ref 250–450)
UIBC: 251 ug/dL (ref 148–442)

## 2021-12-16 LAB — FERRITIN: Ferritin: 125 ng/mL (ref 11–307)

## 2021-12-16 NOTE — Progress Notes (Signed)
Lacey Vega [phone]   [phone]  PROGRESS NOTE  Patient Care Team: Rogers Blocker, MD as PCP - General (Internal Medicine)  Hematological/Oncological History # Iron Deficiency Anemia due to Chronic Blood Loss # Sickle Cell Trait  1) 03/26/2009: Hgb 7.2, first Hgb on record 2) 09/02/2011: 2 units of PRBC at [facility] (Hgb noted at 8.9).  3)  12/03/2011: colonoscopy, found colon polyp and sigmoid diverticula. 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 Underwent argon plasma coagulation  4) Jan 2018: colonoscopy with 2 tubular adenomas removed. Small colonic AVM and small internal hemorrhoids.  5)10/24/2017: Hgb 7.7, MCV 71, Plt 338, and WBC 6.7. POEM procedure for Achalasia. duodenal AVM fulgurated.  6) 03/2018: Hgb 8.2.  7) March 2020: capsule endoscopy: nonbleeding gastric and proximal small bowel AVM,  8) 04/26/2019: Establish Care with Dr. Lorenso Courier  WBC 6.1, Hgb 9.3, MCV 75.3, Plt 289 9) 05/17/2019: Underwent EGD which showed esophagitis and small non-bleeding duodenal angiodysplasias. 10) 07/27/2019: WBC 5.9, Hgb 9.8, MCV 77.8, Plt 271. Recommend IV iron based on labs. Received feraheme 577m IV q7 days x 2 doses on 3/8 and 08/15/2019. Hgb electrophoresis confirms sickle cell trait.  11) 10/24/2019: WBC 5.4, Hgb 10.2, MCV 80.5, Plt 252 12) 02/15/2020: WBC 5.1, Hgb 9.7, MCV 80.2, Plt 280 13) 9/24-10/06/2019: IV feraheme 5160mx 2 doses 14) 04/22/2021: Patient presented to emergency department with heavy nosebleeding.  Had a syncopal episode in the emergency department.  Discharged on 04/24/2021. Hgb 7.2 on admission.  15) 07/05/2021: presented to clinic with Hgb 6.2, MCV 88.9 and Plt 286. Admitted for GI workup.  16) 11/27/2021: colonscopy showed a single small localized angioectasia which was ablated. Small bowel endoscopy showed 5 angioectasias treated with APC  Interval History:  Lacey Vega.o. female with medical history  significant for iron deficiency anemia 2/2 to chronic blood loss/ sickle cell trait who presents for a follow up visit. The patient's last visit was on 11/12/2021. In the interim since the last visit Lacey Vega underwent colonscopy on 11/27/2021 which showed a single small localized angioectasia which was ablated. Small bowel endoscopy showed 5 angioectasias treated with APC.   On exam today Lacey Vega she has been "feeling fine" in the interim since her last visit.  She notes that she is no longer taking her Plavix and will discuss that with the prescribing physician.  She does that she is not tired and that her legs are not hurting as much anymore.  She notes her energy today is about an 8 out of 10.  She is not having any shortness of breath or dizziness.  She does continue to feel cold "always" but overall feels improved.  She is not having any overt signs of bleeding or dark stools.  She was prescribed octreotide by gastroenterology but has not picked this up.  She notes she will discuss this with the GI doctor at her next visit in approximately 2 weeks time.  She denies any issues of lightheadedness or dizziness.  She otherwise denies any fevers, chills, sweats, nausea, vomiting or diarrhea.  A full 10 point ROS is listed below.  MEDICAL HISTORY:  Past Medical History:  Diagnosis Date   Anemia    Anxiety    Asthma    Asthma    Bronchitis    hx   Colon polyps    hyperplastic   Diabetes (HCC)    mild, history of   DJD (degenerative joint  disease), cervical    Dysphagia    history of   Dyspnea    Gallstones    GERD (gastroesophageal reflux disease)    H/O chest pain    secondary to anemia   Headache    History of arteriovenous malformation (AVM)    History of blood transfusion 10/2021   History of hiatal hernia    History of tobacco use    Hyperlipemia    Hypertension    Hypothyroidism    Iron deficiency anemia    Sickle cell anemia (HCC)    "patient is not aware of this"    Thyroid disease    TIA (transient ischemic attack)    "was told that she possible had one"   Wears dentures    Wears glasses     SURGICAL HISTORY: Past Surgical History:  Procedure Laterality Date   ABDOMINAL AORTIC ENDOVASCULAR STENT GRAFT Bilateral 01/31/2021   Procedure: ENDOVASCULAR ANEURYSM REPAIR (EVAR)Bilateral Groin Cutdown, left femoral endaterectomy with bovine patch angioplasty.;  Surgeon: Cherre Robins, MD;  Location: [facility];  Service: Vascular;  Laterality: Bilateral;   ABDOMINAL HYSTERECTOMY     ANTERIOR CERVICAL DECOMP/DISCECTOMY FUSION N/A 10/03/2016   Procedure: ANTERIOR CERVICAL DECOMPRESSION/DISCECTOMY FUSION CERVICAL THREE- CERVICAL FOUR;  Surgeon: Ashok Pall, MD;  Location: [place];  Service: Neurosurgery;  Laterality: N/A;  Right side approach   BOTOX INJECTION  08/29/2015   Procedure: BOTOX INJECTION;  Surgeon: Wilford Corner, MD;  Location: [place];  Service: Endoscopy;;   CHOLECYSTECTOMY     COLONOSCOPY  12/03/2011   Procedure: COLONOSCOPY;  Surgeon: [name], MD;  Location: [facility];  Service: Endoscopy;  Laterality: N/A;   COLONOSCOPY N/A 11/27/2021   Procedure: COLONOSCOPY;  Surgeon: Sharyn Creamer, MD;  Location: [facility];  Service: Gastroenterology;  Laterality: N/A;   ENTEROSCOPY N/A 07/07/2021   Procedure: ENTEROSCOPY;  Surgeon: Clarene Essex, MD;  Location: [facility];  Service: Endoscopy;  Laterality: N/A;   ENTEROSCOPY N/A 08/06/2021   Procedure: ENTEROSCOPY;  Surgeon: Ronnette Juniper, MD;  Location: [facility];  Service: Gastroenterology;  Laterality: N/A;   ENTEROSCOPY N/A 11/27/2021   Procedure: SMALL  BOWEL ENTEROSCOPY;  Surgeon: Sharyn Creamer, MD;  Location: [facility];  Service: Gastroenterology;  Laterality: N/A;   ESOPHAGEAL MANOMETRY N/A 07/13/2017   Procedure: ESOPHAGEAL MANOMETRY (EM);  Surgeon: Wilford Corner, MD;  Location: [facility];  Service: Endoscopy;  Laterality: N/A;   ESOPHAGOGASTRODUODENOSCOPY  12/03/2011    Procedure: ESOPHAGOGASTRODUODENOSCOPY (EGD);  Surgeon: [name], MD;  Location: [facility];  Service: Endoscopy;  Laterality: N/A;   ESOPHAGOGASTRODUODENOSCOPY (EGD) WITH PROPOFOL N/A 08/29/2015   Procedure: ESOPHAGOGASTRODUODENOSCOPY (EGD) WITH PROPOFOL;  Surgeon: Wilford Corner, MD;  Location[facility];  Service: Endoscopy;  Laterality: N/A;   ESOPHAGOGASTRODUODENOSCOPY (EGD) WITH PROPOFOL N/A 05/17/2019   Procedure: ESOPHAGOGASTRODUODENOSCOPY (EGD) WITH PROPOFOL;  Surgeon: Wilford Corner, MD;  [facility]: [facility];  Service: Endoscopy;  Laterality: N/A;   ESOPHAGOGASTRODUODENOSCOPY (EGD) WITH PROPOFOL N/A 04/24/2021   Procedure: ESOPHAGOGASTRODUODENOSCOPY (EGD) WITH PROPOFOL;  Surgeon: Otis Brace, MD;  Location: [place];  Service: Gastroenterology;  Laterality: N/A;   GIVENS CAPSULE STUDY N/A 08/05/2018   Procedure: GIVENS CAPSULE STUDY;  Surgeon: Wilford Corner, MD;  Location: [place];  Service: Endoscopy;  Laterality: N/A;   GIVENS CAPSULE STUDY N/A 07/06/2021   Procedure: GIVENS CAPSULE STUDY;  Surgeon: Clarene Essex, MD;  Location: [facility];  Service: Endoscopy;  Laterality: N/A;   HOT HEMOSTASIS  12/03/2011   Procedure: HOT HEMOSTASIS (ARGON PLASMA COAGULATION/BICAP);  Surgeon: [name], MD;  Location: [facility];  Service: Endoscopy;  Laterality: N/A;   HOT HEMOSTASIS N/A 07/07/2021   Procedure: HOT HEMOSTASIS (ARGON PLASMA COAGULATION/BICAP);  Surgeon: Clarene Essex, MD;  Location: [facility];  Service: Endoscopy;  Laterality: N/A;   HOT HEMOSTASIS N/A 08/06/2021   Procedure: HOT HEMOSTASIS (ARGON PLASMA COAGULATION/BICAP);  Surgeon: Ronnette Juniper, MD;  Location: [facility];  Service: Gastroenterology;  Laterality: N/A;   HOT HEMOSTASIS  11/27/2021   Procedure: HOT HEMOSTASIS (ARGON PLASMA COAGULATION/BICAP);  Surgeon: Sharyn Creamer, MD;  Location: [facility];  Service: Gastroenterology;;  EGD and COLON   NECK SURGERY     PERIPHERAL  VASCULAR INTERVENTION  03/29/2021   Procedure: PERIPHERAL VASCULAR INTERVENTION;  Surgeon: Cherre Robins, MD;  Location: [place] [facility];  Service: Cardiovascular;;  Lt SFA Brachial Approach   POLYPECTOMY  11/27/2021   Procedure: POLYPECTOMY;  Surgeon: Sharyn Creamer, MD;  Location: [facility];  Service: Gastroenterology;;   ROTATOR CUFF REPAIR     right   ROTATOR CUFF REPAIR Right 2014   THROMBECTOMY FEMORAL ARTERY Left 01/31/2021   Procedure: THROMBECTOMY POPLITEAL  ARTERY;  Surgeon: Cherre Robins, MD;  Location: [facility] OR;  Service: Vascular;  Laterality: Left;   TONSILLECTOMY      SOCIAL HISTORY: Social History   Socioeconomic History   Marital status: Divorced    Spouse name: Not on file   Number of children: 2   Years of education: Not on file   Highest education level: Not on file  Occupational History   Occupation: group leader environmental services    Employer: [employer]    Comment: [employer]  Tobacco Use   Smoking status: Former    Types: Cigarettes    Quit date: 06/02/2000    Years since quitting: 21.5   Smokeless tobacco: Never  Vaping Use   Vaping Use: Never used  Substance and Sexual Activity   Alcohol use: No   Drug use: No   Sexual activity: Not on file  Other Topics Concern   Not on file  Social History Narrative   ** Merged History Encounter **       Social Determinants of Health   Financial Resource Strain: Not on file  Food Insecurity: Not on file  Transportation Needs: Not on file  Physical Activity: Not on file  Stress: Not on file  Social Connections: Not on file  Intimate Partner Violence: Not on file    FAMILY HISTORY: Family History  Problem Relation Age of Onset   Diabetes Mother    Heart attack Brother    Cancer Maternal Aunt     ALLERGIES:  is allergic to oxycodone.  MEDICATIONS:  Current Outpatient Medications  Medication Sig Dispense Refill   acetaminophen (TYLENOL) 500 MG tablet Take 500-1,000 mg  by mouth every 6 (six) hours as needed for mild pain or headache.     albuterol (VENTOLIN HFA) 108 (90 Base) MCG/ACT inhaler Inhale 1-2 puffs into the lungs every 6 (six) hours as needed for wheezing or shortness of breath.     aspirin EC 81 MG tablet Take 1 tablet (81 mg total) by mouth daily. **Start on 11/25** (Patient taking differently: Take 81 mg by mouth daily.) 30 tablet 11   atorvastatin (LIPITOR) 40 MG tablet Take 40 mg by mouth at bedtime.     carisoprodol (SOMA) 250 MG tablet Take 250 mg by mouth 2 (two) times daily as needed (cramps).  Cinnamon 500 MG TABS Take 1,000 mg by mouth every evening.     clopidogrel (PLAVIX) 75 MG tablet Take 1 tablet (75 mg total) by mouth daily. **Start on 11/25** (Patient not taking: Reported on 11/08/2021) 30 tablet 11   donepezil (ARICEPT) 5 MG tablet Take 5 mg by mouth at bedtime.     ferrous sulfate 325 (65 FE) MG tablet Take 1 tablet (325 mg total) by mouth daily with breakfast. with orange juice 90 tablet 1   folic acid (FOLVITE) 917 MCG tablet Take 800 mcg by mouth daily.     furosemide (LASIX) 20 MG tablet Take 20 mg by mouth daily.      levothyroxine (SYNTHROID) 100 MCG tablet Take 100 mcg by mouth daily.     linaclotide (LINZESS) 145 MCG CAPS capsule Take 1 capsule (145 mcg total) by mouth daily before breakfast. 30 capsule 3   Magnesium Oxide 420 (252 Mg) MG TABS Take 420-840 mg by mouth as needed (for cramps).     montelukast (SINGULAIR) 10 MG tablet Take 10 mg by mouth every evening.     Multiple Vitamins-Minerals (HAIR/SKIN/NAILS) CAPS Take 5,000 mcg by mouth daily.     Na Sulfate-K Sulfate-Mg Sulf (SUPREP BOWEL PREP KIT) 17.5-3.13-1.6 GM/177ML SOLN Take 1 kit by mouth as directed. 324 mL 0   nitroGLYCERIN (NITROSTAT) 0.4 MG SL tablet Place 0.4 mg under the tongue every 5 (five) minutes as needed for chest pain.     octreotide (SANDOSTATIN LAR DEPOT) 10 MG injection Inject 10 mg into the muscle every 28 (twenty-eight) days. 1 each 3    Omega-3 Fatty Acids (FISH OIL BURP-LESS) 1200 MG CAPS Take 1,000 mg by mouth every evening.     pantoprazole (PROTONIX) 40 MG tablet Take 1 tablet (40 mg total) by mouth 2 (two) times daily before a meal. 60 tablet 1   vitamin B-12 (CYANOCOBALAMIN) 1000 MCG tablet Take 1,000 mcg by mouth daily.     No current facility-administered medications for this visit.   Facility-Administered Medications Ordered in Other Visits  Medication Dose Route Frequency Provider Last Rate Last Admin   0.9 %  sodium chloride infusion   Intravenous Continuous Wilford Corner, MD        REVIEW OF SYSTEMS:   Constitutional: ( - ) fevers, ( - )  chills , ( - ) night sweats (+) fatigue Eyes: ( - ) blurriness of vision, ( - ) double vision, ( - ) watery eyes Ears, nose, mouth, throat, and face: ( - ) mucositis, ( - ) sore throat Respiratory: ( - ) cough, ( - ) dyspnea, ( - ) wheezes Cardiovascular: ( - ) palpitation, ( - ) chest discomfort, ( - ) lower extremity swelling Gastrointestinal:  ( - ) nausea, ( - ) heartburn, ( - ) change in bowel habits Skin: ( - ) abnormal skin rashes Lymphatics: ( - ) new lymphadenopathy, ( - ) easy bruising Neurological: ( - ) numbness, ( - ) tingling, ( - ) new weaknesses Behavioral/Psych: ( - ) mood change, ( - ) new changes  All other systems were reviewed with the patient and are negative.  PHYSICAL EXAMINATION: ECOG PERFORMANCE STATUS: 1 - Symptomatic but completely ambulatory  Vitals:   12/16/21 1048  BP: 124/62  Pulse: 85  Resp: 16  Temp: (!) 97.3 F (36.3 C)  SpO2: 98%   Filed Weights   12/16/21 1048  Weight: 179 lb 3.2 oz (81.3 kg)    GENERAL: well appearing elderly African  American female in NAD  SKIN: skin color, texture, turgor are normal, no rashes or significant lesions EYES: conjunctiva are pink and non-injected, sclera clear LUNGS: clear to auscultation and percussion with normal breathing effort HEART: regular rate & rhythm and no murmurs and no  lower extremity edema Musculoskeletal: no cyanosis of digits and no clubbing  PSYCH: alert & oriented x 3, fluent speech NEURO: no focal motor/sensory deficits  LABORATORY DATA:  I have reviewed the data as listed    Latest Ref Rng & Units 12/16/2021   10:33 AM 11/12/2021   10:50 AM 11/08/2021   11:20 AM  CBC  WBC 4.0 - 10.5 K/uL 5.7  5.6  6.9   Hemoglobin 12.0 - 15.0 g/dL 9.9  7.9  8.1 Repeated and verified X2.   Hematocrit 36.0 - 46.0 % 31.3  26.2  25.5   Platelets 150 - 400 K/uL 265  299  286.0        Latest Ref Rng & Units 12/16/2021   10:33 AM 11/12/2021   10:50 AM 11/05/2021    5:25 PM  CMP  Glucose 70 - 99 mg/dL 123  103  105   BUN 8 - 23 mg/dL 12  16  25    Creatinine 0.44 - 1.00 mg/dL 1.06  1.00  1.26   Sodium 135 - 145 mmol/L 144  145  141   Potassium 3.5 - 5.1 mmol/L 2.8  3.5  3.5   Chloride 98 - 111 mmol/L 103  110  109   CO2 22 - 32 mmol/L 34  31  24   Calcium 8.9 - 10.3 mg/dL 9.3  9.1  8.5   Total Protein 6.5 - 8.1 g/dL 7.2  6.4    Total Bilirubin 0.3 - 1.2 mg/dL 0.9  0.6    Alkaline Phos 38 - 126 U/L 103  75    AST 15 - 41 U/L 19  16    ALT 0 - 44 U/L 12  11      RADIOGRAPHIC STUDIES: None relevant to review.  No results found.  ASSESSMENT & [place] 71 y.o. female with medical history significant for iron deficiency anemia 2/2 to chronic blood loss presents for a follow up visit.  Lacey Vega establish care with [place] in November 2020 and has been taking p.o. iron since that time.  When her levels failed to improve at her last visit she was started on IV Iron therapy and received IV feraheme 539m x 2 doses on 3/8 and 08/15/19. She received additional doses on 9/24 and 03/02/2020.  She had a hospital admission from 04/22/2021 until 04/24/2021 for epistaxis.  In clinic on 05/09/2019 the patient was found to have hemoglobin 6.3.  Transfusion was arranged for 2 units packed red blood cells on 05/09/2021.Transfusion was required again in Feb 2023 with admission.  Additional 2 units of PRBC given on 10/14/2021.   #Iron Deficiency Anemia from Chronic Blood Loss --continue PO ferrous sulfide 3210mdaily with source of vitamin C. --source of blood loss previously identified as AVMs in the GI tract. Likely a strong contribution from the patient's sickle cell trait preventing her from reaching a normal Hgb. Baseline Hgb is  approximately 10.  --transfusion goal for Hgb <7.0. Hgb today at 9.9 --patient received IV feraheme 51054m 2 dose on 9/24-10/06/2019.  --received 5 doses of IV iron sucorse from 10/29/2021 to 11/26/2021.  --RTC in 8 weeks with interval 4 week lab check   #Sickle Cell Trait --  Hgb electrophoresis confirmed sickle cell trait on 07/27/2019. --like a strong contributing factor to the patients persistently low Hgb. Most likely her baseline hemoglobin is approximately 10.0   #Bleeding AVM/GI Bleeding --colonscopy and small bowel endoscopy performed on 11/27/2021 --small bowel endoscopy showed 5 angioectasias treated with APC --colonscopy showed 1 angioectasias --f/u with GI next month as scheduled.   No orders of the defined types were placed in this encounter.  All questions were answered. The patient knows to call the clinic with any problems, questions or concerns.  A total of more than 30 minutes were spent on this encounter and over half of that time was spent on counseling and coordination of care as outlined above.   Ledell Peoples, MD Department of Hematology/Oncology [place] [facility] Phone: [phone] Pager: [phone] Email: [email or web address]  12/16/2021 12:40 PM

## 2021-12-31 ENCOUNTER — Other Ambulatory Visit (HOSPITAL_COMMUNITY): Payer: Self-pay

## 2021-12-31 ENCOUNTER — Encounter: Payer: Self-pay | Admitting: Hematology and Oncology

## 2021-12-31 ENCOUNTER — Telehealth: Payer: Self-pay | Admitting: Pharmacy Technician

## 2021-12-31 ENCOUNTER — Encounter: Payer: Self-pay | Admitting: Internal Medicine

## 2021-12-31 ENCOUNTER — Telehealth: Payer: Self-pay | Admitting: Internal Medicine

## 2021-12-31 ENCOUNTER — Ambulatory Visit (INDEPENDENT_AMBULATORY_CARE_PROVIDER_SITE_OTHER): Payer: Medicare HMO | Admitting: Internal Medicine

## 2021-12-31 VITALS — BP 120/60 | HR 70 | Ht 65.0 in | Wt 180.0 lb

## 2021-12-31 DIAGNOSIS — K5520 Angiodysplasia of colon without hemorrhage: Secondary | ICD-10-CM | POA: Diagnosis not present

## 2021-12-31 DIAGNOSIS — K5900 Constipation, unspecified: Secondary | ICD-10-CM | POA: Diagnosis not present

## 2021-12-31 DIAGNOSIS — D500 Iron deficiency anemia secondary to blood loss (chronic): Secondary | ICD-10-CM

## 2021-12-31 MED ORDER — LINACLOTIDE 145 MCG PO CAPS: 145.0000 ug | ORAL_CAPSULE | Freq: Every day | ORAL | 6 refills | Status: DC

## 2021-12-31 MED ORDER — PANTOPRAZOLE SODIUM 40 MG PO TBEC: 40.0000 mg | DELAYED_RELEASE_TABLET | Freq: Two times a day (BID) | ORAL | 2 refills | Status: DC

## 2021-12-31 MED ORDER — SANDOSTATIN LAR DEPOT 10 MG IM KIT: 10.0000 mg | PACK | INTRAMUSCULAR | 6 refills | Status: DC

## 2021-12-31 NOTE — Telephone Encounter (Signed)
Patient Advocate Encounter  Received notification from PHARMACY that prior authorization for SANDOSTATIN '10MG'$  is required.   PA submitted on 8.1.23 Key [id number] Status is pending    [name], CPhT Patient Advocate Phone: [phone]

## 2021-12-31 NOTE — Telephone Encounter (Signed)
Received a call from Janett Billow, [place], [phone], ext [phone], requesting a prior authorization for patient's Sandostatin.  Any questions, please call.  Thank you.Marland Kitchen

## 2021-12-31 NOTE — Progress Notes (Signed)
Chief Complaint: Melena and anemia  HPI : 71 year old female with history of small bowel angioectasias, PUD, achalasia s/p POEM in 2019, asthma, DM, anemia gallstones, TIA, hypothyroidism presents for follow up of melena and anemia  Interval History: Stools have been looking dark looking but not tarry or sticky. She is on oral iron supplements. Denies ab pain. Denies hematochezia. She is eating and drinking pretty well. Denies N&V. Denies dysphagia, odynophagia, chest burning, or regurgitation. She gets the hiccups every once in a while when she eats. She is not taking Plavix currently. Patient has been taking Linzess as needed to help with constipation. Patient does not think that she is taking PPI therapy and has not started octreotide.  Wt Readings from Last 3 Encounters:  12/31/21 180 lb (81.6 kg)  12/16/21 179 lb 3.2 oz (81.3 kg)  11/27/21 186 lb (84.4 kg)   Current Outpatient Medications  Medication Sig Dispense Refill   acetaminophen (TYLENOL) 500 MG tablet Take 500-1,000 mg by mouth every 6 (six) hours as needed for mild pain or headache.     albuterol (VENTOLIN HFA) 108 (90 Base) MCG/ACT inhaler Inhale 1-2 puffs into the lungs every 6 (six) hours as needed for wheezing or shortness of breath.     aspirin EC 81 MG tablet Take 1 tablet (81 mg total) by mouth daily. **Start on 11/25** (Patient taking differently: Take 81 mg by mouth daily.) 30 tablet 11   atorvastatin (LIPITOR) 40 MG tablet Take 40 mg by mouth at bedtime.     carisoprodol (SOMA) 250 MG tablet Take 250 mg by mouth 2 (two) times daily as needed (cramps).      Cinnamon 500 MG TABS Take 1,000 mg by mouth every evening.     donepezil (ARICEPT) 5 MG tablet Take 5 mg by mouth at bedtime.     ferrous sulfate 325 (65 FE) MG tablet Take 1 tablet (325 mg total) by mouth daily with breakfast. with orange juice 90 tablet 1   folic acid (FOLVITE) 829 MCG tablet Take 800 mcg by mouth daily.     furosemide (LASIX) 20 MG tablet Take  20 mg by mouth daily.      levothyroxine (SYNTHROID) 100 MCG tablet Take 100 mcg by mouth daily.     linaclotide (LINZESS) 145 MCG CAPS capsule Take 1 capsule (145 mcg total) by mouth daily before breakfast. 30 capsule 3   Magnesium Oxide 420 (252 Mg) MG TABS Take 420-840 mg by mouth as needed (for cramps).     montelukast (SINGULAIR) 10 MG tablet Take 10 mg by mouth every evening.     Multiple Vitamins-Minerals (HAIR/SKIN/NAILS) CAPS Take 5,000 mcg by mouth daily.     Na Sulfate-K Sulfate-Mg Sulf (SUPREP BOWEL PREP KIT) 17.5-3.13-1.6 GM/177ML SOLN Take 1 kit by mouth as directed. 324 mL 0   nitroGLYCERIN (NITROSTAT) 0.4 MG SL tablet Place 0.4 mg under the tongue every 5 (five) minutes as needed for chest pain.     octreotide (SANDOSTATIN LAR DEPOT) 10 MG injection Inject 10 mg into the muscle every 28 (twenty-eight) days. 1 each 3   Omega-3 Fatty Acids (FISH OIL BURP-LESS) 1200 MG CAPS Take 1,000 mg by mouth every evening.     pantoprazole (PROTONIX) 40 MG tablet Take 1 tablet (40 mg total) by mouth 2 (two) times daily before a meal. 60 tablet 1   vitamin B-12 (CYANOCOBALAMIN) 1000 MCG tablet Take 1,000 mcg by mouth daily.     clopidogrel (PLAVIX) 75 MG tablet  Take 1 tablet (75 mg total) by mouth daily. **Start on 11/25** (Patient not taking: Reported on 11/08/2021) 30 tablet 11   No current facility-administered medications for this visit.   Facility-Administered Medications Ordered in Other Visits  Medication Dose Route Frequency Provider Last Rate Last Admin   0.9 %  sodium chloride infusion   Intravenous Continuous Wilford Corner, MD       Physical Exam: BP 120/60   Pulse 70   Ht _0  (1.651 m)   Wt 180 lb (81.6 kg)   BMI 29.95 kg/m  Constitutional: Pleasant,well-developed, female in no acute distress. HEENT: Normocephalic and atraumatic. Conjunctivae are normal. No scleral icterus. Cardiovascular: Normal rate, regular rhythm.  Pulmonary/chest: Effort normal and breath sounds  normal. No wheezing, rales or rhonchi. Abdominal: Soft, nondistended, nontender. Bowel sounds active throughout. There are no masses palpable. No hepatomegaly. Extremities: No edema Neurological: Alert and oriented to person place and time. Skin: Skin is warm and dry. No rashes noted. Psychiatric: Normal mood and affect. Behavior is normal.  Labs 10/2021: BMP with elevated BUN of 25 and elevated Cr of 1.26. CBC with low Hb of 6.2. H pylori antibody negative.  Labs 11/2021: CBC with low Hb of 9.9. CMP with mildly elevated Cr of 1.06. Iron/TIBC nml. Ferritin is 125  CTA A/P w/contrast 03/16/21: IMPRESSION: VASCULAR  1. Focal flow limiting dissection flap in the origin of the left superficial femoral artery adjacent to the cutdown and presumed endarterectomy site in the common femoral artery. The vessels distal (SFA, popliteal and runoff arteries) remain patent but are relatively diminutive in caliber consistent with decreased flow. This is likely the source of the patient's claudication. 2. 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 New occlusion of the left internal iliac artery likely decreases ability for auto collateralization around the flow limiting lesion in the proximal superficial femoral artery. 3. Successful endovascular aortic repair of large infrarenal abdominal aortic aneurysm. Aneurysm sac size is slightly decreased in diameter. No evidence of endoleak on arterial phase imaging. Please note this study was not protocoled for thorough evaluation of endoleak (no delayed phase images obtained). 4. 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 NON-VASCULAR  1. No acute abnormality within the abdomen or pelvis. 2. Similar appearance of 10 mm ground-glass  attenuation nodular opacity in the peripheral right lower lobe as seen on the recent prior imaging. As previously noted, initial follow-up at 6-12 months is recommended to confirm persistence. If persistent, repeat CT is recommended every 2 years until 5 years of stability has been established. 3. Stable misty mesentery which is a nonspecific finding. This is favored to be related to the relatively large aneurysm size and resultant congestion of lymphatic outflow. Recommend attention on routine follow-up imaging post aneurysm repair. If there is evidence of progressive lymphadenopathy, then an underlying lymphoproliferative disorder would have to be considered  Colonoscopy 12/2011: Colon polyp s/p snare cautery. Path: Colon, polyp(s), left descending TUBULAR ADENOMA. NO HIGH GRADE DYSPLASIA OR MALIGNANCY IDENTIFIED. (ONE FRAGMENT).  Esophageal manometry 07/13/17: Achalasia type 3  EGD with POEM 10/23/17: Findings:       Abnormal motility was noted in the lower third of the esophagus. There       is spasticity of the esophageal body. Two small pulsion diverticula were  noted in the body. Normal peristalsis not noted. The gastroscope was       fitted with a clear 57m straight cap. Retained secretions were present       in the esophagus. Motility of the esophageal body was assessed       endoscopically. The GEJ was located at 40 cm and was tight. The       endoscope was advanced through the GEJ. The esophagus was washed with       chlorhexidine. The mucosal incision was created at 24 cm. A mucosal bleb       was created by injection a combination of Voluven and blue dye into the       submucosa. A 2 cm incision was made with a triangle tip (TT) knife using       Endocut mode at 5:00 on the esophageal wall. The submucosal fibers were       dissected with spray coagulation (573W, effect 2) and the endoscope       entered the submucosal space. A submucosal tunnel was created using        spray coagulation and injection of indigo carmine solution via the pump.       When vessels were identified they were treated. The myotomy was       performed from 28 to 44 cm using spray coagulation current (540W, effect       2), confirmed by anatomy. Hemostasis was verified at the end of the       procedure and gentamicin 875mwas injected into the tunnel. The mucosal       incision was then securely closed with 6 hemoclips The stomach was normal.       Multiple small angioectasias without bleeding were found in the duodenal       bulb and in the second portion of the duodenum. Fulguration to ablate       the lesion by TT knife spray coagulation was successful.   VCE 08/05/18: Small non-bleeding gastric AVM. Small nonbleeding proximal small bowel AVM. Duodenal erosion  EGD 05/17/19: - LA Grade C esophagitis with bleeding. - Normal stomach. - A few small non-bleeding angiodysplastic lesions in the duodenum. - Mucosal changes in the duodenum.  EGD 04/24/21: - Z-line regular, 40 cm from the incisors. - Gastritis with hemorrhage. - Normal duodenal bulb, first portion of the duodenum, second portion of the duodenum and third portion of the duodenum. - No specimens collected.  VCE 07/06/21 Findings: First gastric image 00:01:29 First duodenal image 00:15:34 First cecal image 02:05:04 Blood visualized in the stomach. Duodenal angioectasia visualized at 00:15:44, 00:15:52, and 00:16:23.  Blood clot with possible active bleeding visualized at 00:21:13 and 00:21:23. Summary and Recommendations: Recommend SBE for treatment of duodenal angioectasias and evaluation of bleeding source from the stomach and proximal small bowel. Spoke with Dr. MaWatt Climesnd he will plan to do this procedure tomorrow. Made NPO after MN in preparation.  SBE 07/07/21: - Normal esophagus. - A single non-bleeding angiodysplastic lesion in the stomach. Treated with argon plasma coagulation (APC). - Three non-bleeding  angiodysplastic lesions in the duodenum. Treated with argon plasma coagulation (APC). - The examined portion of the jejunum was normal. - No specimens collected.  SBE 08/06/21: - LA Grade A esophagitis with bleeding. - Non-bleeding gastric ulcer with a clean ulcer base (Forrest Class III). - Multiple recently bleeding angioectasias in the duodenum. Treated with argon plasma coagulation (APC). - No specimens collected.  SBE 11/27/21: -  LA Grade A esophagitis with no bleeding. - Mild gastritis. - Five non-bleeding angioectasias in the duodenum. Treated with argon plasma coagulation (APC). - Two non-bleeding angioectasias in the jejunum. Treated with argon plasma coagulation (APC). - No specimens collected.  Colonoscopy 11/27/21: - The examined portion of the ileum was normal. - Three 3 to 4 mm polyps in the transverse colon and in the ascending colon, removed with a cold snare. Resected and retrieved. - A single non-bleeding colonic angioectasia. Treated with argon plasma coagulation (APC). - Diverticulosis in the sigmoid colon, in the descending colon, in the transverse colon and in the ascending colon. - Non-bleeding internal hemorrhoids. - Anal wart. Path: A. TRANSVERSE COLON, POLYPECTOMY:  Benign colonic mucosa with lymphoid aggregate  B. ASCENDING COLON, POLYPECTOMY:  Tubular adenoma  Negative for high-grade dysplasia and carcinoma   ASSESSMENT AND PLAN: IDA Melena - improved Constipation SB angioectasias History of PUD and esophagitis History of colon polyps Patient presents for follow up of IDA and SB angioectasias. I performed her last SBE in 10/2021 with treatment of 7 small bowel angioectasias at that time. Her stools are no longer tarry and her recent labs show a significant improvement in her Hb and iron levels. This suggests that her bleeding has stopped for now. Patient has not yet started PPI therapy or IM octreotide. I went over the reasoning for these treatments,  including treatment of esophagitis (with PPI) and prevention of future small bowel AVMs (with octreotide). Patient understands and will plan to start these medications. Patient's constipation seems to be better on the Linzess therapy. - Continue to trend Hb and iron levels - Start pantoprazole 40 mg BID. Refilled - Start octreotide 10 mg IM monthly. - Cont Linzess 145 mcg QD. Refilled. - Repeat colonoscopy due in 10/2028 if patient is interested at that time - RTC in 6 months. Patient will let us know if she develops any recurrent GI bleeding  Christia Reading, MD

## 2021-12-31 NOTE — Patient Instructions (Signed)
If you are age 72 or older, your body mass index should be between 23-30. Your Body mass index is 29.95 kg/m. If this is out of the aforementioned range listed, please consider follow up with your Primary Care Provider. ________________________________________________________  The [facility] providers would like to encourage you to use [facility] to communicate with providers for non-urgent requests or questions.  Due to long hold times on the telephone, sending your provider a message by [facility] may be a faster and more efficient way to get a response.  Please allow 48 business hours for a response.  Please remember that this is for non-urgent requests.  _______________________________________________________  We have sent the following medications to your pharmacy for you to pick up at your convenience: Pantoprazole '40mg'$  Octreotide Linzess  You will follow up in our office in 6 months (February 2024).  We will contact you to schedule this appointment.  Thank you for entrusting me with your care and choosing [facility].  Dr Lorenso Courier

## 2022-01-02 ENCOUNTER — Other Ambulatory Visit (HOSPITAL_COMMUNITY): Payer: Self-pay

## 2022-01-02 NOTE — Telephone Encounter (Signed)
Patient Advocate Encounter  Prior Authorization for SANDOSTATIN 10MG KIT has been approved.    PA# [id number] Effective dates: 8.2.23 through 12.31.23  Jandel Patriarca B. CPhT P: [phone] F: [phone]

## 2022-01-06 ENCOUNTER — Other Ambulatory Visit: Payer: Self-pay

## 2022-01-06 NOTE — Telephone Encounter (Signed)
The patient assistance program is for oncology patients. The co-pay card cannot be used when the patient has medicare. This is all I could find.

## 2022-01-07 MED ORDER — AMBULATORY NON FORMULARY MEDICATION: 0 refills | Status: DC

## 2022-01-08 ENCOUNTER — Telehealth: Payer: Self-pay | Admitting: Pharmacy Technician

## 2022-01-08 ENCOUNTER — Other Ambulatory Visit (HOSPITAL_COMMUNITY): Payer: Self-pay

## 2022-01-08 ENCOUNTER — Other Ambulatory Visit: Payer: Self-pay

## 2022-01-08 MED ORDER — AMBULATORY NON FORMULARY MEDICATION: 0 refills | Status: DC

## 2022-01-08 NOTE — Telephone Encounter (Signed)
PA for Octreotide '50mg'$  Syr has been submitted and Telephone Encounter has been created. Will follow up on encounter and forward information when decision is made.

## 2022-01-08 NOTE — Telephone Encounter (Signed)
Patient Advocate Encounter  Received notification from [employer] that prior authorization for OCTREOTIDE '50MG'$  SYR is required.   PA submitted on 8.9.23 Key [id number] Status is pending    [name], CPhT Patient Advocate Phone: [phone]

## 2022-01-09 ENCOUNTER — Other Ambulatory Visit (HOSPITAL_COMMUNITY): Payer: Self-pay

## 2022-01-09 ENCOUNTER — Other Ambulatory Visit: Payer: Self-pay

## 2022-01-09 MED ORDER — OCTREOTIDE ACETATE 50 MCG/ML IJ SOLN: 50.0000 ug | Freq: Two times a day (BID) | INTRAMUSCULAR | 0 refills | Status: DC

## 2022-01-09 NOTE — Telephone Encounter (Signed)
Patient Advocate Encounter  Prior Authorization for OCTREOTIDE 50MCG SYR has been approved.    PA# [id number] Effective dates: 1.1.23 through 12.31.23  Arsal Tappan B. CPhT P: [phone] F: [phone]

## 2022-01-09 NOTE — Telephone Encounter (Signed)
Yes. PA for 3mg has been submitted. No decision made as of yet.

## 2022-01-10 ENCOUNTER — Other Ambulatory Visit: Payer: Self-pay

## 2022-01-10 ENCOUNTER — Telehealth: Payer: Self-pay

## 2022-01-10 NOTE — Telephone Encounter (Signed)
Spoke with the patient about her octreotide injections. The pharmacist at [facility] [place] ([place] [place], confirmed the Octreotide will be pre-filled syringes; it is not available in pens. Patient is advised the octreotide must be stored in her refrigerator and protected from the light. The Octreotide injection will be injected into the fat layer under the skin as a subcutaneous injection twice a day.  The patient will contact me when she receives the injections. Her daughter or daughter-in-law will contact me prior to starting the injections. These family members work in healthcare and one is insulin dependent diabetic familiar with subcutaneous injections.

## 2022-01-13 ENCOUNTER — Inpatient Hospital Stay: Payer: Medicare HMO | Attending: Hematology and Oncology

## 2022-01-13 ENCOUNTER — Telehealth: Payer: Self-pay | Admitting: Internal Medicine

## 2022-01-13 ENCOUNTER — Other Ambulatory Visit: Payer: Self-pay

## 2022-01-13 DIAGNOSIS — D573 Sickle-cell trait: Secondary | ICD-10-CM | POA: Insufficient documentation

## 2022-01-13 DIAGNOSIS — D500 Iron deficiency anemia secondary to blood loss (chronic): Secondary | ICD-10-CM | POA: Diagnosis present

## 2022-01-13 LAB — CBC WITH DIFFERENTIAL (CANCER CENTER ONLY)
Abs Immature Granulocytes: 0.01 10*3/uL (ref 0.00–0.07)
Basophils Absolute: 0 10*3/uL (ref 0.0–0.1)
Basophils Relative: 1 %
Eosinophils Absolute: 0.3 10*3/uL (ref 0.0–0.5)
Eosinophils Relative: 6 %
HCT: 29.1 % — ABNORMAL LOW (ref 36.0–46.0)
Hemoglobin: 9.2 g/dL — ABNORMAL LOW (ref 12.0–15.0)
Immature Granulocytes: 0 %
Lymphocytes Relative: 21 %
Lymphs Abs: 1.1 10*3/uL (ref 0.7–4.0)
MCH: 25.2 pg — ABNORMAL LOW (ref 26.0–34.0)
MCHC: 31.6 g/dL (ref 30.0–36.0)
MCV: 79.7 fL — ABNORMAL LOW (ref 80.0–100.0)
Monocytes Absolute: 0.7 10*3/uL (ref 0.1–1.0)
Monocytes Relative: 13 %
Neutro Abs: 3 10*3/uL (ref 1.7–7.7)
Neutrophils Relative %: 59 %
Platelet Count: 301 10*3/uL (ref 150–400)
RBC: 3.65 MIL/uL — ABNORMAL LOW (ref 3.87–5.11)
RDW: 15.5 % (ref 11.5–15.5)
WBC Count: 5.1 10*3/uL (ref 4.0–10.5)
nRBC: 0 % (ref 0.0–0.2)

## 2022-01-13 LAB — CMP (CANCER CENTER ONLY)
ALT: 10 U/L (ref 0–44)
AST: 15 U/L (ref 15–41)
Albumin: 4 g/dL (ref 3.5–5.0)
Alkaline Phosphatase: 107 U/L (ref 38–126)
Anion gap: 3 — ABNORMAL LOW (ref 5–15)
BUN: 13 mg/dL (ref 8–23)
CO2: 32 mmol/L (ref 22–32)
Calcium: 8.9 mg/dL (ref 8.9–10.3)
Chloride: 107 mmol/L (ref 98–111)
Creatinine: 0.96 mg/dL (ref 0.44–1.00)
GFR, Estimated: >60 mL/min (ref >60)
Glucose, Bld: 101 mg/dL — ABNORMAL HIGH (ref 70–99)
Potassium: 3.5 mmol/L (ref 3.5–5.1)
Sodium: 142 mmol/L (ref 135–145)
Total Bilirubin: 0.7 mg/dL (ref 0.3–1.2)
Total Protein: 7 g/dL (ref 6.5–8.1)

## 2022-01-13 LAB — IRON AND IRON BINDING CAPACITY (CC-WL,HP ONLY)
Iron: 41 ug/dL (ref 28–170)
Saturation Ratios: 12 % (ref 10.4–31.8)
TIBC: 330 ug/dL (ref 250–450)
UIBC: 289 ug/dL (ref 148–442)

## 2022-01-13 LAB — RETIC PANEL
Immature Retic Fract: 21.3 % — ABNORMAL HIGH (ref 2.3–15.9)
RBC.: 3.57 MIL/uL — ABNORMAL LOW (ref 3.87–5.11)
Retic Count, Absolute: 46.8 10*3/uL (ref 19.0–186.0)
Retic Ct Pct: 1.3 % (ref 0.4–3.1)
Reticulocyte Hemoglobin: 26.4 pg — ABNORMAL LOW (ref >27.9)

## 2022-01-13 LAB — FERRITIN: Ferritin: 61 ng/mL (ref 11–307)

## 2022-01-13 NOTE — Telephone Encounter (Signed)
Inbound call from patient stating that she was supposed to be taking octreotide injections. Patient stated she was told that they were going to cost her about 45 dollars and the pharmacy called and told her it was going to be about 95 dollars. Patient stated she told the pharmacy not worry about it, she was not going to pay that much. Patient just wanted to inform our office.

## 2022-01-13 NOTE — Telephone Encounter (Signed)
FYI

## 2022-01-15 NOTE — Telephone Encounter (Signed)
Patient has declined due to co-pay on medication. Dr Lorenso Courier notified.

## 2022-01-17 ENCOUNTER — Telehealth: Payer: Self-pay | Admitting: Internal Medicine

## 2022-01-17 NOTE — Telephone Encounter (Signed)
See previous phone note. Patient declines to pay for co-pay for this medication. Informed pharmacy that prescription is cancelled per patient.

## 2022-01-17 NOTE — Telephone Encounter (Signed)
Inbound call from [facility] needing clarification on prescription Octreotide 50 mg. Please give a call back at your earliest convenience.  Thank you

## 2022-02-10 ENCOUNTER — Inpatient Hospital Stay: Payer: Medicare HMO | Admitting: Hematology and Oncology

## 2022-02-10 ENCOUNTER — Inpatient Hospital Stay: Payer: Medicare HMO

## 2022-03-10 ENCOUNTER — Other Ambulatory Visit: Payer: Self-pay | Admitting: Internal Medicine

## 2022-03-10 ENCOUNTER — Ambulatory Visit
Admission: RE | Admit: 2022-03-10 | Discharge: 2022-03-10 | Disposition: A | Discharge status: Home / Self Care | Payer: Medicare HMO | Source: Ambulatory Visit | Attending: Internal Medicine | Admitting: Internal Medicine

## 2022-03-10 DIAGNOSIS — R52 Pain, unspecified: Secondary | ICD-10-CM

## 2022-03-27 ENCOUNTER — Other Ambulatory Visit: Payer: Self-pay | Admitting: Hematology and Oncology

## 2022-05-12 ENCOUNTER — Other Ambulatory Visit: Payer: Self-pay | Admitting: Internal Medicine

## 2022-05-12 ENCOUNTER — Ambulatory Visit
Admission: RE | Admit: 2022-05-12 | Discharge: 2022-05-12 | Disposition: A | Discharge status: Home / Self Care | Payer: Medicare HMO | Source: Ambulatory Visit | Attending: Internal Medicine | Admitting: Internal Medicine

## 2022-05-12 DIAGNOSIS — R059 Cough, unspecified: Secondary | ICD-10-CM

## 2022-06-03 ENCOUNTER — Telehealth: Payer: Self-pay

## 2022-06-03 NOTE — Telephone Encounter (Signed)
-----   Message from Stevan Born, [place] sent at 12/31/2021 11:26 AM EDT ----- Regarding: February 2024 6 months, YCD, dx IDA, avm's, constipation,

## 2022-06-03 NOTE — Telephone Encounter (Signed)
Left message for patient to return call to schedule 6 month follow up appointment with Dr Lorenso Courier in February 2024.  Will continue efforts.

## 2022-06-04 NOTE — Telephone Encounter (Signed)
Patient aware that she is scheduled to follow up with Dr Lorenso Courier on 07-08-22 at 11:10am.  Patient agreed to plan and verbalized understanding.  No further questions.

## 2022-06-06 ENCOUNTER — Other Ambulatory Visit: Payer: Self-pay | Admitting: *Deleted

## 2022-06-06 DIAGNOSIS — I739 Peripheral vascular disease, unspecified: Secondary | ICD-10-CM

## 2022-06-06 DIAGNOSIS — I714 Abdominal aortic aneurysm, without rupture, unspecified: Secondary | ICD-10-CM

## 2022-06-13 ENCOUNTER — Other Ambulatory Visit: Payer: Self-pay | Admitting: Internal Medicine

## 2022-06-16 NOTE — Progress Notes (Unsigned)
[facility] [place] PROGRESS NOTE  ASSESSMENT / PLAN: Lacey Vega is a 72 y.o. female s/p EVAR complicated by left femoral acute limb ischemia requiring thrombectomy.  She had persistent stenosis at the proximal superficial femoral artery which I treated endovascularly/28/22.  She has had normalization of her ankle-brachial index and good healing of her calf.  I think we he can begin routine surveillance post EVAR.  We will see her again in 6 months with duplex of the stent graft.  SUBJECTIVE: Doing better from a left leg standpoint. Left calf has nearly healed. No pain in the foot or legs. Still some discomfort in the thighs with walking which may be improving.   OBJECTIVE: There were no vitals taken for this visit. No acute distress Regular rate and rhythm Unlabored breathing Soft abdomen Left calf incision nearly healed     Latest Ref Rng & Units 01/13/2022   10:24 AM 12/16/2021   10:33 AM 11/12/2021   10:50 AM  CBC  WBC 4.0 - 10.5 K/uL 5.1  5.7  5.6   Hemoglobin 12.0 - 15.0 g/dL 9.2  9.9  7.9   Hematocrit 36.0 - 46.0 % 29.1  31.3  26.2   Platelets 150 - 400 K/uL 301  265  299         Latest Ref Rng & Units 01/13/2022   10:24 AM 12/16/2021   10:33 AM 11/12/2021   10:50 AM  CMP  Glucose 70 - 99 mg/dL 101  123  103   BUN 8 - 23 mg/dL '13  12  16   '$ Creatinine 0.44 - 1.00 mg/dL 0.96  1.06  1.00   Sodium 135 - 145 mmol/L 142  144  145   Potassium 3.5 - 5.1 mmol/L 3.5  2.8  3.5   Chloride 98 - 111 mmol/L 107  103  110   CO2 22 - 32 mmol/L 32  34  31   Calcium 8.9 - 10.3 mg/dL 8.9  9.3  9.1   Total Protein 6.5 - 8.1 g/dL 7.0  7.2  6.4   Total Bilirubin 0.3 - 1.2 mg/dL 0.7  0.9  0.6   Alkaline Phos 38 - 126 U/L 107  103  75   AST 15 - 41 U/L '15  19  16   '$ ALT 0 - 44 U/L '10  12  11     '$ +-------+-----------+-----------+------------+------------+  ABI/TBIToday's ABIToday's TBIPrevious ABIPrevious TBI   +-------+-----------+-----------+------------+------------+  Right  1.06       0.23       1.05        0.54          +-------+-----------+-----------+------------+------------+  Left   0.98       0.53       0.48        0.19          +-------+-----------+-----------+------------+------------+   [name], MD [facility] Phone Number: [phone] 06/16/2022 9:12 PM

## 2022-06-17 ENCOUNTER — Encounter: Payer: Self-pay | Admitting: Vascular Surgery

## 2022-06-17 ENCOUNTER — Ambulatory Visit (INDEPENDENT_AMBULATORY_CARE_PROVIDER_SITE_OTHER)
Admission: RE | Admit: 2022-06-17 | Discharge: 2022-06-17 | Disposition: A | Discharge status: Home / Self Care | Payer: Medicare HMO | Source: Ambulatory Visit | Attending: Vascular Surgery | Admitting: Vascular Surgery

## 2022-06-17 ENCOUNTER — Ambulatory Visit (HOSPITAL_COMMUNITY)
Admission: RE | Admit: 2022-06-17 | Discharge: 2022-06-17 | Disposition: A | Discharge status: Home / Self Care | Payer: Medicare HMO | Source: Ambulatory Visit | Attending: Vascular Surgery | Admitting: Vascular Surgery

## 2022-06-17 ENCOUNTER — Ambulatory Visit: Payer: Medicare HMO | Admitting: Vascular Surgery

## 2022-06-17 VITALS — BP 130/78 | HR 79 | Temp 97.7°F | Resp 20 | Ht 65.0 in | Wt 183.0 lb

## 2022-06-17 DIAGNOSIS — I714 Abdominal aortic aneurysm, without rupture, unspecified: Secondary | ICD-10-CM | POA: Insufficient documentation

## 2022-06-17 DIAGNOSIS — I739 Peripheral vascular disease, unspecified: Secondary | ICD-10-CM | POA: Insufficient documentation

## 2022-06-17 DIAGNOSIS — Z8679 Personal history of other diseases of the circulatory system: Secondary | ICD-10-CM | POA: Diagnosis not present

## 2022-06-17 DIAGNOSIS — Z9889 Other specified postprocedural states: Secondary | ICD-10-CM | POA: Diagnosis not present

## 2022-06-19 ENCOUNTER — Other Ambulatory Visit: Payer: Self-pay

## 2022-06-19 DIAGNOSIS — Z8679 Personal history of other diseases of the circulatory system: Secondary | ICD-10-CM

## 2022-06-20 LAB — VAS US ABI WITH/WO TBI
Left ABI: 0.71
Right ABI: 0.91

## 2022-07-08 ENCOUNTER — Encounter: Payer: Self-pay | Admitting: Internal Medicine

## 2022-07-08 ENCOUNTER — Other Ambulatory Visit (INDEPENDENT_AMBULATORY_CARE_PROVIDER_SITE_OTHER): Payer: Medicare HMO

## 2022-07-08 ENCOUNTER — Ambulatory Visit: Payer: Medicare HMO | Admitting: Internal Medicine

## 2022-07-08 VITALS — BP 122/60 | HR 68 | Ht 65.0 in | Wt 183.0 lb

## 2022-07-08 DIAGNOSIS — D500 Iron deficiency anemia secondary to blood loss (chronic): Secondary | ICD-10-CM

## 2022-07-08 DIAGNOSIS — K5520 Angiodysplasia of colon without hemorrhage: Secondary | ICD-10-CM | POA: Diagnosis not present

## 2022-07-08 LAB — CBC WITH DIFFERENTIAL/PLATELET
Basophils Absolute: 0 10*3/uL (ref 0.0–0.1)
Basophils Relative: 0.9 % (ref 0.0–3.0)
Eosinophils Absolute: 0.1 10*3/uL (ref 0.0–0.7)
Eosinophils Relative: 2.6 % (ref 0.0–5.0)
HCT: 33.1 % — ABNORMAL LOW (ref 36.0–46.0)
Hemoglobin: 10.5 g/dL — ABNORMAL LOW (ref 12.0–15.0)
Lymphocytes Relative: 25.3 % (ref 12.0–46.0)
Lymphs Abs: 1.1 10*3/uL (ref 0.7–4.0)
MCHC: 31.8 g/dL (ref 30.0–36.0)
MCV: 77.3 fl — ABNORMAL LOW (ref 78.0–100.0)
Monocytes Absolute: 0.5 10*3/uL (ref 0.1–1.0)
Monocytes Relative: 12.2 % — ABNORMAL HIGH (ref 3.0–12.0)
Neutro Abs: 2.5 10*3/uL (ref 1.4–7.7)
Neutrophils Relative %: 59 % (ref 43.0–77.0)
Platelets: 294 10*3/uL (ref 150.0–400.0)
RBC: 4.29 Mil/uL (ref 3.87–5.11)
RDW: 16.5 % — ABNORMAL HIGH (ref 11.5–15.5)
WBC: 4.3 10*3/uL (ref 4.0–10.5)

## 2022-07-08 LAB — IBC + FERRITIN
Ferritin: 55.6 ng/mL (ref 10.0–291.0)
Iron: 58 ug/dL (ref 42–145)
Saturation Ratios: 14.9 % — ABNORMAL LOW (ref 20.0–50.0)
TIBC: 389.2 ug/dL (ref 250.0–450.0)
Transferrin: 278 mg/dL (ref 212.0–360.0)

## 2022-07-08 MED ORDER — LINACLOTIDE 145 MCG PO CAPS: 145.0000 ug | ORAL_CAPSULE | Freq: Every day | ORAL | 6 refills | Status: DC

## 2022-07-08 MED ORDER — PANTOPRAZOLE SODIUM 40 MG PO TBEC: 40.0000 mg | DELAYED_RELEASE_TABLET | Freq: Every day | ORAL | 1 refills | Status: AC

## 2022-07-08 NOTE — Patient Instructions (Signed)
Your provider has requested that you go to the basement level for lab work before leaving today. Press "B" on the elevator. The lab is located at the first door on the left as you exit the elevator.  We have sent the following medications to your pharmacy for you to pick up at your convenience: Pantoprazole & Linzess  Due to recent changes in healthcare laws, you may see the results of your imaging and laboratory studies on MyChart before your provider has had a chance to review them.  We understand that in some cases there may be results that are confusing or concerning to you. Not all laboratory results come back in the same time frame and the provider may be waiting for multiple results in order to interpret others.  Please give [place] 48 hours in order for your provider to thoroughly review all the results before contacting the office for clarification of your results.    It was a pleasure to see you today!  Thank you for trusting me with your gastrointestinal care!

## 2022-07-08 NOTE — Progress Notes (Signed)
Chief Complaint: Anemia  HPI : 72 year old female with history of small bowel angioectasias, PUD, achalasia s/p POEM in 2019, asthma, DM, anemia gallstones, TIA, hypothyroidism presents for follow up of anemia  Interval History: Stools are sometimes dark. She is still on oral iron. She is eating and drinking well. Denies N&V or ab pain. Denies acid reflux. She is still off of the Plavix. She is still using the Linzess PRN, which is helping with constipation. Denies weakness. She was not able to take the octreotide because it was prohibitively expensive.  Wt Readings from Last 3 Encounters:  07/08/22 183 lb (83 kg)  06/17/22 183 lb (83 kg)  12/31/21 180 lb (81.6 kg)   Current Outpatient Medications  Medication Sig Dispense Refill   acetaminophen (TYLENOL) 500 MG tablet Take 500-1,000 mg by mouth every 6 (six) hours as needed for mild pain or headache.     albuterol (VENTOLIN HFA) 108 (90 Base) MCG/ACT inhaler Inhale 1-2 puffs into the lungs every 6 (six) hours as needed for wheezing or shortness of breath.     aspirin EC 81 MG tablet Take 1 tablet (81 mg total) by mouth daily. **Start on 11/25** (Patient taking differently: Take 81 mg by mouth daily.) 30 tablet 11   atorvastatin (LIPITOR) 40 MG tablet Take 40 mg by mouth at bedtime.     carisoprodol (SOMA) 250 MG tablet Take 250 mg by mouth 2 (two) times daily as needed (cramps).      Cinnamon 500 MG TABS Take 1,000 mg by mouth every evening.     donepezil (ARICEPT) 5 MG tablet Take 5 mg by mouth at bedtime.     ferrous sulfate 325 (65 FE) MG tablet TAKE 1 TABLET (325 MG TOTAL) BY MOUTH DAILY WITH BREAKFAST. WITH ORANGE JUICE 90 tablet 1   folic acid (FOLVITE) 086 MCG tablet Take 800 mcg by mouth daily.     furosemide (LASIX) 20 MG tablet Take 20 mg by mouth daily.      levothyroxine (SYNTHROID) 100 MCG tablet Take 100 mcg by mouth daily.     linaclotide (LINZESS) 145 MCG CAPS capsule Take 1 capsule (145 mcg total) by mouth daily before  breakfast. 30 capsule 6   Magnesium Oxide 420 (252 Mg) MG TABS Take 420-840 mg by mouth as needed (for cramps).     montelukast (SINGULAIR) 10 MG tablet Take 10 mg by mouth every evening.     Multiple Vitamins-Minerals (HAIR/SKIN/NAILS) CAPS Take 5,000 mcg by mouth daily.     Na Sulfate-K Sulfate-Mg Sulf (SUPREP BOWEL PREP KIT) 17.5-3.13-1.6 GM/177ML SOLN Take 1 kit by mouth as directed. 324 mL 0   nitroGLYCERIN (NITROSTAT) 0.4 MG SL tablet Place 0.4 mg under the tongue every 5 (five) minutes as needed for chest pain.     octreotide (SANDOSTATIN) 50 MCG/ML SOLN injection Inject 1 mL (50 mcg total) into the skin 2 (two) times daily. 60 mL 0   Omega-3 Fatty Acids (FISH OIL BURP-LESS) 1200 MG CAPS Take 1,000 mg by mouth every evening.     pantoprazole (PROTONIX) 40 MG tablet TAKE 1 TABLET BY MOUTH DAILY BEFORE BREAKFAST 90 tablet 1   vitamin B-12 (CYANOCOBALAMIN) 1000 MCG tablet Take 1,000 mcg by mouth daily.     No current facility-administered medications for this visit.   Facility-Administered Medications Ordered in Other Visits  Medication Dose Route Frequency Provider Last Rate Last Admin   0.9 %  sodium chloride infusion   Intravenous Continuous Wilford Corner, MD  Physical Exam: BP 122/60   Pulse 68   Ht '5\' 5"'$  (1.651 m)   Wt 183 lb (83 kg)   SpO2 98%   BMI 30.45 kg/m  Constitutional: Pleasant,well-developed, female in no acute distress. HEENT: Normocephalic and atraumatic. Conjunctivae are normal. No scleral icterus. Cardiovascular: Normal rate  Pulmonary/chest: Effort normal  Abdominal: Soft, nondistended, nontender. Bowel sounds active throughout. There are no masses palpable. No hepatomegaly. Extremities: No edema Neurological: Alert and oriented to person place and time. Skin: Skin is warm and dry. No rashes noted. Psychiatric: Normal mood and affect. Behavior is normal.  Labs 10/2021: BMP with elevated BUN of 25 and elevated Cr of 1.26. CBC with low Hb of 6.2. H  pylori antibody negative.  Labs 11/2021: CBC with low Hb of 9.9. CMP with mildly elevated Cr of 1.06. Iron/TIBC nml. Ferritin is 125  Labs 12/2021: CBC with Hb of 9.2. Ferritin 62. CMP unremarkable.  CTA A/P w/contrast 03/16/21: IMPRESSION: VASCULAR  1. Focal flow limiting dissection flap in the origin of the left superficial femoral artery adjacent to the cutdown and presumed endarterectomy site in the common femoral artery. The vessels distal (SFA, popliteal and runoff arteries) remain patent but are relatively diminutive in caliber consistent with decreased flow. This is likely the source of the patient's claudication. 2. 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 New occlusion of the left internal iliac artery likely decreases ability for auto collateralization around the flow limiting lesion in the proximal superficial femoral artery. 3. Successful endovascular aortic repair of large infrarenal abdominal aortic aneurysm. Aneurysm sac size is slightly decreased in diameter. No evidence of endoleak on arterial phase imaging. Please note this study was not protocoled for thorough evaluation of endoleak (no delayed phase images obtained). 4. 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 NON-VASCULAR  1. No acute abnormality within the abdomen or pelvis. 2. Similar appearance of 10 mm ground-glass attenuation nodular opacity in the peripheral right lower lobe as seen on the recent prior imaging. As previously noted, initial follow-up at 6-12 months is recommended to confirm persistence. If persistent, repeat CT is recommended every 2 years until 5 years of stability has been established. 3. Stable misty mesentery which is a nonspecific finding. This is favored to  be related to the relatively large aneurysm size and resultant congestion of lymphatic outflow. Recommend attention on routine follow-up imaging post aneurysm repair. If there is evidence of progressive lymphadenopathy, then an underlying lymphoproliferative disorder would have to be considered  Colonoscopy 12/2011: Colon polyp s/p snare cautery. Path: Colon, polyp(s), left descending TUBULAR ADENOMA. NO HIGH GRADE DYSPLASIA OR MALIGNANCY IDENTIFIED. (ONE FRAGMENT).  Esophageal manometry 07/13/17: Achalasia type 3  EGD with POEM 10/23/17: Findings:       Abnormal motility was noted in the lower third of the esophagus. There       is spasticity of the esophageal body. Two small pulsion diverticula were       noted in the body. Normal peristalsis not noted. The gastroscope was       fitted with a clear 37m straight cap. Retained secretions were present       in the esophagus. Motility of the esophageal body was assessed       endoscopically. The GEJ was located at 40 cm and was tight. The  endoscope was advanced through the GEJ. The esophagus was washed with       chlorhexidine. The mucosal incision was created at 24 cm. A mucosal bleb       was created by injection a combination of Voluven and blue dye into the       submucosa. A 2 cm incision was made with a triangle tip (TT) knife using       Endocut mode at 5:00 on the esophageal wall. The submucosal fibers were       dissected with spray coagulation (70 W, effect 2) and the endoscope       entered the submucosal space. A submucosal tunnel was created using       spray coagulation and injection of indigo carmine solution via the pump.       When vessels were identified they were treated. The myotomy was       performed from 28 to 44 cm using spray coagulation current (64 W, effect       2), confirmed by anatomy. Hemostasis was verified at the end of the       procedure and gentamicin '80mg'$  was injected into the tunnel. The mucosal        incision was then securely closed with 6 hemoclips The stomach was normal.       Multiple small angioectasias without bleeding were found in the duodenal       bulb and in the second portion of the duodenum. Fulguration to ablate       the lesion by TT knife spray coagulation was successful.   VCE 08/05/18: Small non-bleeding gastric AVM. Small nonbleeding proximal small bowel AVM. Duodenal erosion  EGD 05/17/19: - LA Grade C esophagitis with bleeding. - Normal stomach. - A few small non-bleeding angiodysplastic lesions in the duodenum. - Mucosal changes in the duodenum.  EGD 04/24/21: - Z-line regular, 40 cm from the incisors. - Gastritis with hemorrhage. - Normal duodenal bulb, first portion of the duodenum, second portion of the duodenum and third portion of the duodenum. - No specimens collected.  VCE 07/06/21 Findings: First gastric image 00:01:29 First duodenal image 00:15:34 First cecal image 02:05:04 Blood visualized in the stomach. Duodenal angioectasia visualized at 00:15:44, 00:15:52, and 00:16:23.  Blood clot with possible active bleeding visualized at 00:21:13 and 00:21:23. Summary and Recommendations: Recommend SBE for treatment of duodenal angioectasias and evaluation of bleeding source from the stomach and proximal small bowel. Spoke with Dr. Watt Climes and he will plan to do this procedure tomorrow. Made NPO after MN in preparation.  SBE 07/07/21: - Normal esophagus. - A single non-bleeding angiodysplastic lesion in the stomach. Treated with argon plasma coagulation (APC). - Three non-bleeding angiodysplastic lesions in the duodenum. Treated with argon plasma coagulation (APC). - The examined portion of the jejunum was normal. - No specimens collected.  SBE 08/06/21: - LA Grade A esophagitis with bleeding. - Non-bleeding gastric ulcer with a clean ulcer base (Forrest Class III). - Multiple recently bleeding angioectasias in the duodenum. Treated with argon  plasma coagulation (APC). - No specimens collected.  SBE 11/27/21: - LA Grade A esophagitis with no bleeding. - Mild gastritis. - Five non-bleeding angioectasias in the duodenum. Treated with argon plasma coagulation (APC). - Two non-bleeding angioectasias in the jejunum. Treated with argon plasma coagulation (APC). - No specimens collected.  Colonoscopy 11/27/21: - The examined portion of the ileum was normal. - Three 3 to 4 mm polyps in the transverse colon and in the ascending  colon, removed with a cold snare. Resected and retrieved. - A single non-bleeding colonic angioectasia. Treated with argon plasma coagulation (APC). - Diverticulosis in the sigmoid colon, in the descending colon, in the transverse colon and in the ascending colon. - Non-bleeding internal hemorrhoids. - Anal wart. Path: A. TRANSVERSE COLON, POLYPECTOMY:  Benign colonic mucosa with lymphoid aggregate  B. ASCENDING COLON, POLYPECTOMY:  Tubular adenoma  Negative for high-grade dysplasia and carcinoma   ASSESSMENT AND PLAN: IDA Melena - improved Constipation SB angioectasias History of PUD and esophagitis History of colon polyps Patient presents for follow up of IDA and SB angioectasias. I performed her last SBE in 10/2021 with treatment of 7 small bowel angioectasias at that time. Patient has been doing well since then. She does have occasional dark stools but this may be due to her iron supplements. Will recheck her blood counts and iron levels today to see if she is table. - Check CBC and ferritin/IBC - Cont oral iron - Cont pantoprazole 40 mg BID. Refilled - Patient previously declined octreotide because it was too expensive - Cont Linzess 145 mcg QD. Refilled. - Repeat colonoscopy due in 10/2028 if patient is interested at that time - RTC in 6 months. Patient will let us know if she develops any recurrent GI bleeding  I spent 30 minutes of time, including in depth chart review, independent review of  results as outlined above, communicating results with the patient directly, face-to-face time with the patient, coordinating care, and ordering studies and medications as appropriate, and documentation.   Christia Reading, MD

## 2022-07-11 ENCOUNTER — Other Ambulatory Visit: Payer: Self-pay

## 2022-07-11 DIAGNOSIS — I714 Abdominal aortic aneurysm, without rupture, unspecified: Secondary | ICD-10-CM

## 2022-07-17 ENCOUNTER — Other Ambulatory Visit: Payer: Self-pay | Admitting: Vascular Surgery

## 2022-07-20 ENCOUNTER — Ambulatory Visit (HOSPITAL_BASED_OUTPATIENT_CLINIC_OR_DEPARTMENT_OTHER)
Admission: RE | Admit: 2022-07-20 | Discharge: 2022-07-20 | Disposition: A | Discharge status: Home / Self Care | Payer: Medicare HMO | Source: Ambulatory Visit | Attending: Vascular Surgery | Admitting: Vascular Surgery

## 2022-07-20 DIAGNOSIS — I714 Abdominal aortic aneurysm, without rupture, unspecified: Secondary | ICD-10-CM | POA: Insufficient documentation

## 2022-07-20 MED ORDER — IOHEXOL 350 MG/ML SOLN
100.0000 mL | Freq: Once | INTRAVENOUS | Status: AC | PRN
  Administered 2022-07-20: 100 mL via INTRAVENOUS

## 2022-07-21 NOTE — Progress Notes (Unsigned)
[facility] [place] PROGRESS NOTE  ASSESSMENT / PLAN: Lacey Vega is a 72 y.o. female s/p EVAR complicated by left femoral acute limb ischemia requiring thrombectomy.  She had persistent stenosis at the proximal superficial femoral artery which I treated endovascularly 06/29/20.    Noninvasive testing today shows smaller aneurysms back measuring 63 mm.  There is some question of flow within the aneurysm sac, raising the question of endoleak.  Her noninvasive testing shows a slight decrease in the ankle-brachial index on the left.  Her clinical exam shows palpable pulse, however.  Will obtain a CT scan in the coming weeks.  I will call her with the results.  I will see her again in 6 months with repeat duplex unless CT scan shows endoleak.  SUBJECTIVE: No complaints.  Doing well overall.  Walking is much as she likes.   OBJECTIVE: There were no vitals taken for this visit. No acute distress Regular rate and rhythm Unlabored breathing Soft abdomen Left calf incision healed Left DP pulse 2+     Latest Ref Rng & Units 07/08/2022   11:32 AM 01/13/2022   10:24 AM 12/16/2021   10:33 AM  CBC  WBC 4.0 - 10.5 K/uL 4.3  5.1  5.7   Hemoglobin 12.0 - 15.0 g/dL 10.5  9.2  9.9   Hematocrit 36.0 - 46.0 % 33.1  29.1  31.3   Platelets 150.0 - 400.0 K/uL 294.0  301  265         Latest Ref Rng & Units 01/13/2022   10:24 AM 12/16/2021   10:33 AM 11/12/2021   10:50 AM  CMP  Glucose 70 - 99 mg/dL 101  123  103   BUN 8 - 23 mg/dL 13  12  16   $ Creatinine 0.44 - 1.00 mg/dL 0.96  1.06  1.00   Sodium 135 - 145 mmol/L 142  144  145   Potassium 3.5 - 5.1 mmol/L 3.5  2.8  3.5   Chloride 98 - 111 mmol/L 107  103  110   CO2 22 - 32 mmol/L 32  34  31   Calcium 8.9 - 10.3 mg/dL 8.9  9.3  9.1   Total Protein 6.5 - 8.1 g/dL 7.0  7.2  6.4   Total Bilirubin 0.3 - 1.2 mg/dL 0.7  0.9  0.6   Alkaline Phos 38 - 126 U/L 107  103  75   AST 15 - 41 U/L 15  19  16   $ ALT 0 - 44 U/L 10  12   11     $ +-------+-----------+-----------+------------+------------+  ABI/TBIToday's ABIToday's TBIPrevious ABIPrevious TBI  +-------+-----------+-----------+------------+------------+  Right 0.91       0.44       1.06        0.23          +-------+-----------+-----------+------------+------------+  Left  0.71       0.44       0.98        0.53          +-------+-----------+-----------+------------+------------+   Abdominal Aorta: The largest aortic measurement is 6.3 cm. Patent  endovascular aneurysm repair with evidence of possible endolea.   [name], MD [facility] Phone Number: [phone] 07/21/2022 8:28 PM

## 2022-07-22 ENCOUNTER — Ambulatory Visit (INDEPENDENT_AMBULATORY_CARE_PROVIDER_SITE_OTHER): Payer: Medicare HMO | Admitting: Vascular Surgery

## 2022-07-22 ENCOUNTER — Encounter: Payer: Self-pay | Admitting: Vascular Surgery

## 2022-07-22 DIAGNOSIS — Z8679 Personal history of other diseases of the circulatory system: Secondary | ICD-10-CM

## 2022-07-22 DIAGNOSIS — Z9889 Other specified postprocedural states: Secondary | ICD-10-CM

## 2022-07-23 ENCOUNTER — Other Ambulatory Visit: Payer: Self-pay | Admitting: Internal Medicine

## 2022-07-23 DIAGNOSIS — Z1231 Encounter for screening mammogram for malignant neoplasm of breast: Secondary | ICD-10-CM

## 2022-07-24 ENCOUNTER — Ambulatory Visit
Admission: RE | Admit: 2022-07-24 | Discharge: 2022-07-24 | Disposition: A | Discharge status: Home / Self Care | Payer: Medicare HMO | Source: Ambulatory Visit | Attending: Internal Medicine | Admitting: Internal Medicine

## 2022-07-24 DIAGNOSIS — Z1231 Encounter for screening mammogram for malignant neoplasm of breast: Secondary | ICD-10-CM

## 2022-08-19 ENCOUNTER — Other Ambulatory Visit: Payer: Self-pay | Admitting: Hematology and Oncology

## 2022-08-19 DIAGNOSIS — D500 Iron deficiency anemia secondary to blood loss (chronic): Secondary | ICD-10-CM

## 2022-09-26 ENCOUNTER — Telehealth: Payer: Self-pay | Admitting: Hematology and Oncology

## 2022-09-26 ENCOUNTER — Telehealth: Payer: Self-pay | Admitting: *Deleted

## 2022-09-26 NOTE — Telephone Encounter (Signed)
Receive call from patient requesting labs and visit with Dr. Leonides Schanz. She states she is feeling very fatigued and SOB again. Her last visit here was in February 2024  Scheduling message sent. Advised that patient can expect to hear from scheduling soon. Pt voiced understanding.

## 2022-09-26 NOTE — Telephone Encounter (Signed)
Reached out to patient to schedule per 4/26 IB, patient aware of date and time

## 2022-10-09 IMAGING — MG DIGITAL SCREENING BILAT W/ CAD
6 series · 6 of 6 positions shown · non-contrast
Comparison: None.

CLINICAL DATA: Screening.

EXAM:
DIGITAL SCREENING BILATERAL MAMMOGRAM WITH CAD

[L MLO (1 of 2)]
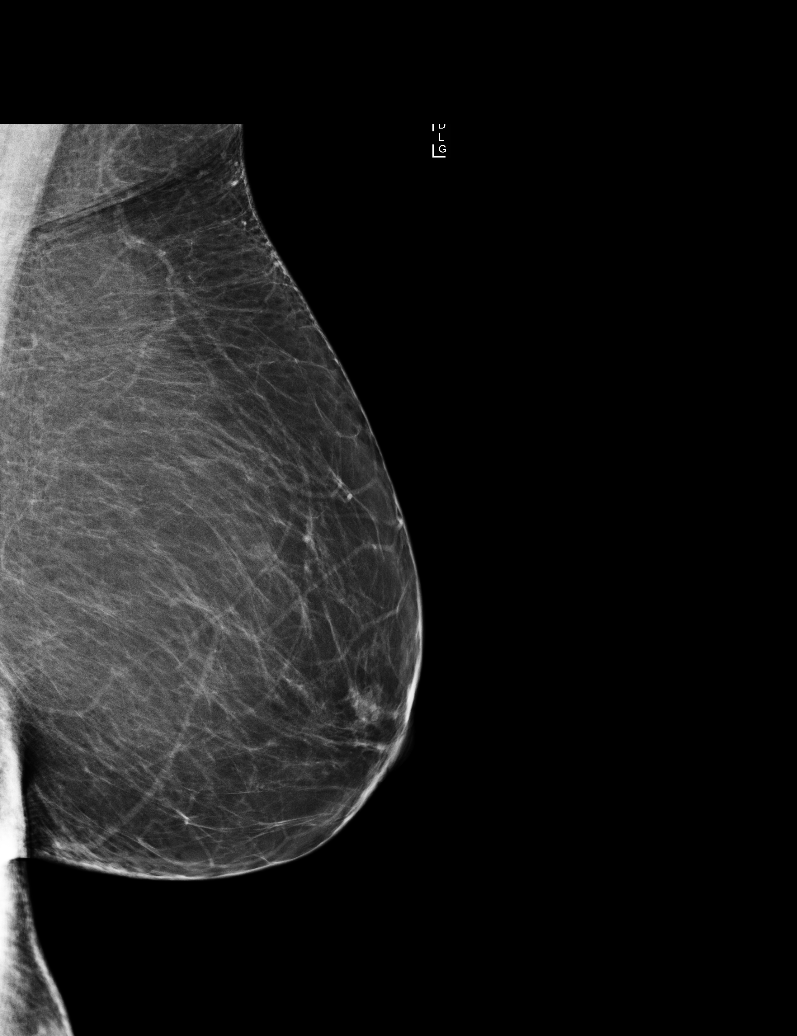

[L MLO (2 of 2)]
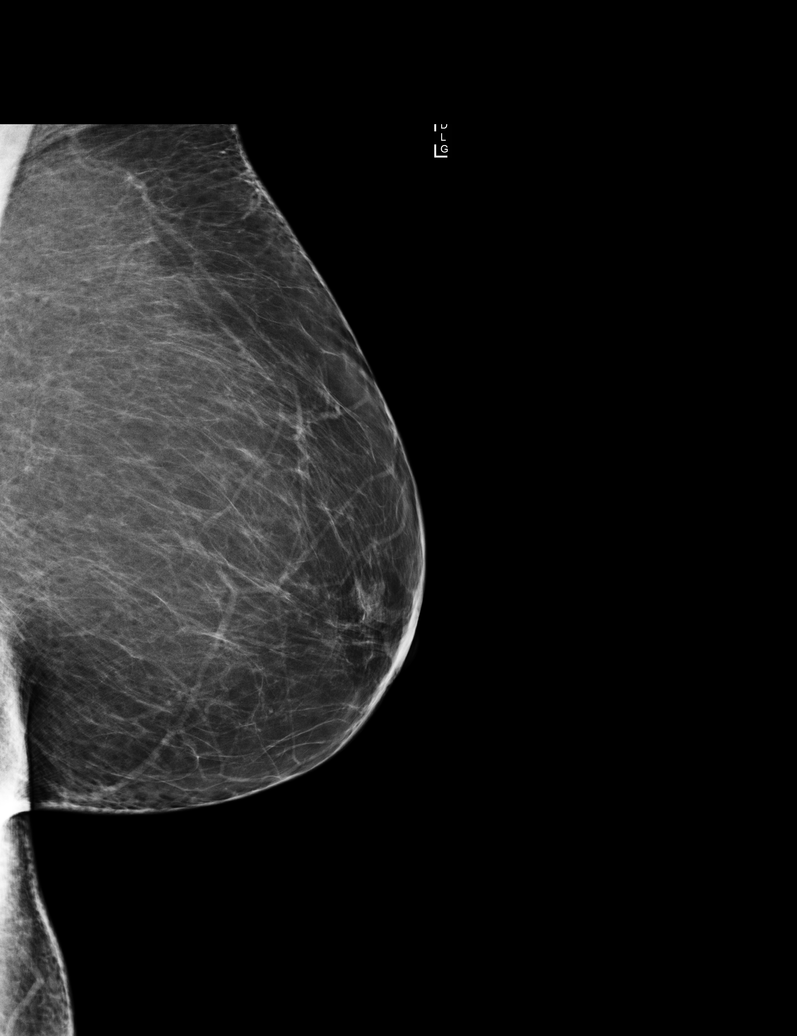

[R MLO (1 of 2)]
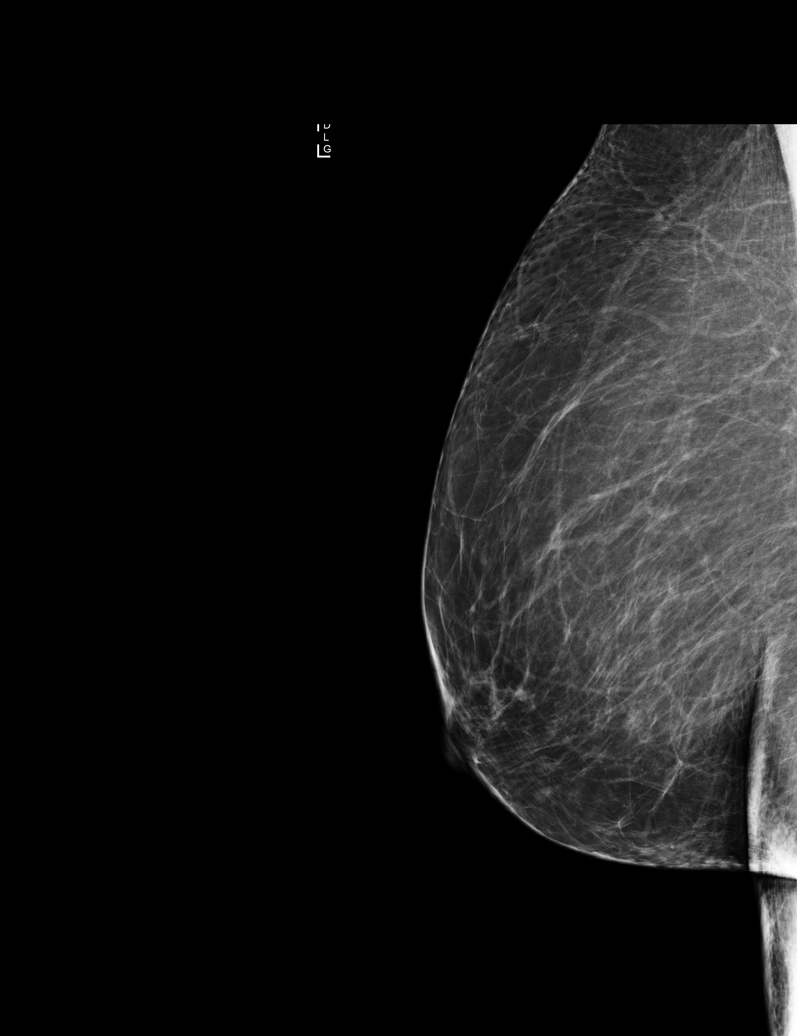

[R MLO (2 of 2)]
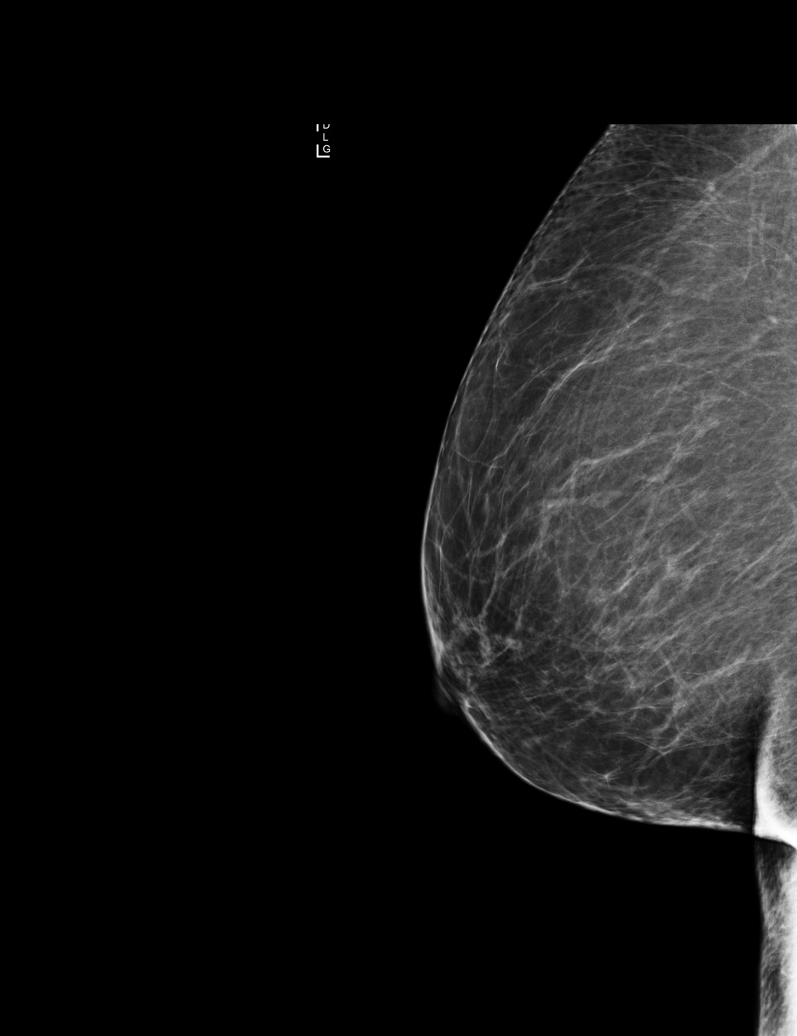

[L CC]
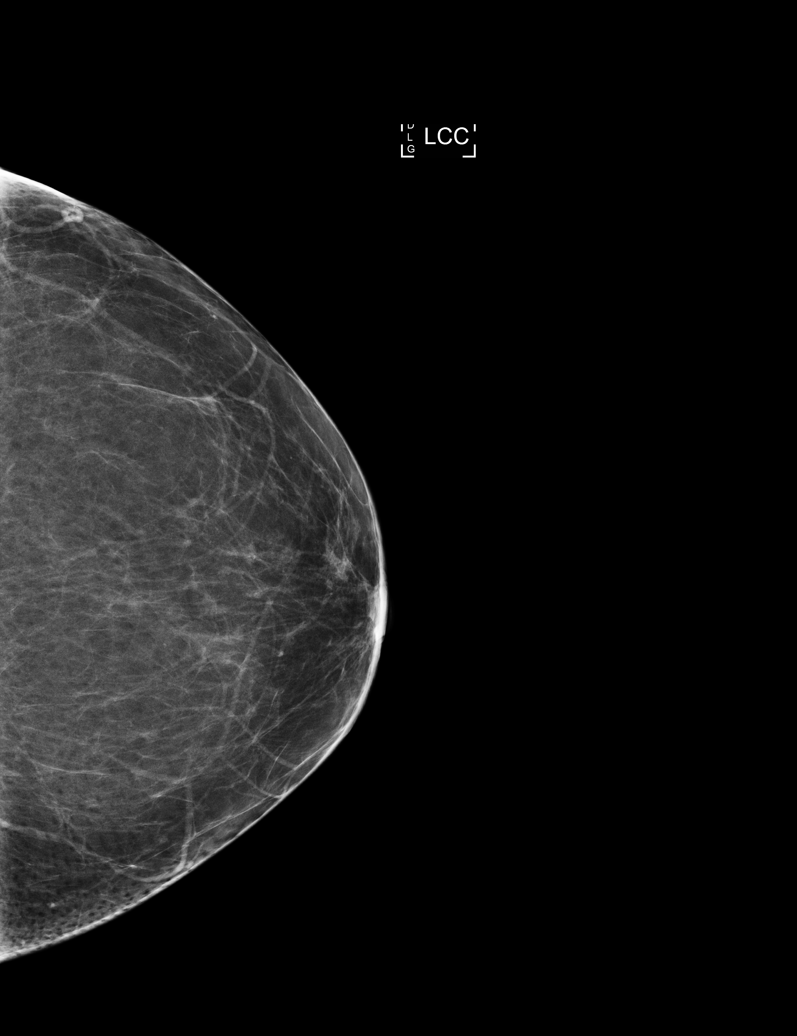

[R CC]
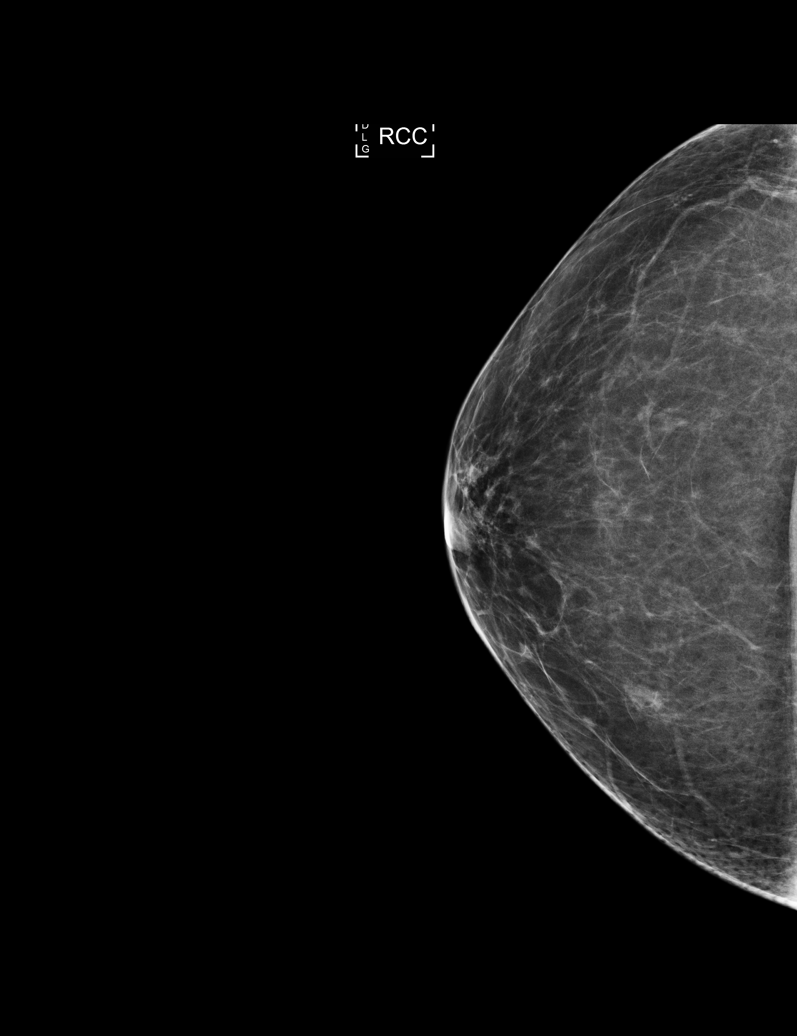

[6 of 6 positions shown; findings below may reference images not displayed]

ACR Breast Density Category b: There are scattered areas of
fibroglandular density.
FINDINGS: In the right breast, a possible asymmetry warrants further
evaluation. In the left breast, no findings suspicious for
malignancy. Images were processed with CAD.
IMPRESSION: Further evaluation is suggested for possible asymmetry in the right
breast.

RECOMMENDATION:
Diagnostic mammogram and possibly ultrasound of the right breast.
(Code:[id number])

The patient will be contacted regarding the findings, and additional
imaging will be scheduled.

BI-RADS CATEGORY  0: Incomplete. Need additional imaging evaluation
and/or prior mammograms for comparison.

## 2022-10-30 ENCOUNTER — Inpatient Hospital Stay: Payer: Medicare HMO

## 2022-10-30 ENCOUNTER — Inpatient Hospital Stay: Payer: Medicare HMO | Attending: Hematology and Oncology | Admitting: Hematology and Oncology

## 2022-10-30 VITALS — BP 119/56 | HR 92 | Temp 98.1°F | Resp 17 | Wt 179.3 lb

## 2022-10-30 DIAGNOSIS — D500 Iron deficiency anemia secondary to blood loss (chronic): Secondary | ICD-10-CM

## 2022-10-30 DIAGNOSIS — D573 Sickle-cell trait: Secondary | ICD-10-CM

## 2022-10-30 DIAGNOSIS — Z87891 Personal history of nicotine dependence: Secondary | ICD-10-CM | POA: Diagnosis not present

## 2022-10-30 LAB — CBC WITH DIFFERENTIAL (CANCER CENTER ONLY)
Abs Immature Granulocytes: 0.11 10*3/uL — ABNORMAL HIGH (ref 0.00–0.07)
Basophils Absolute: 0 10*3/uL (ref 0.0–0.1)
Basophils Relative: 0 %
Eosinophils Absolute: 0.2 10*3/uL (ref 0.0–0.5)
Eosinophils Relative: 2 %
HCT: 23 % — ABNORMAL LOW (ref 36.0–46.0)
Hemoglobin: 7.1 g/dL — ABNORMAL LOW (ref 12.0–15.0)
Immature Granulocytes: 2 %
Lymphocytes Relative: 12 %
Lymphs Abs: 0.9 10*3/uL (ref 0.7–4.0)
MCH: 24.5 pg — ABNORMAL LOW (ref 26.0–34.0)
MCHC: 30.9 g/dL (ref 30.0–36.0)
MCV: 79.3 fL — ABNORMAL LOW (ref 80.0–100.0)
Monocytes Absolute: 0.7 10*3/uL (ref 0.1–1.0)
Monocytes Relative: 9 %
Neutro Abs: 5.5 10*3/uL (ref 1.7–7.7)
Neutrophils Relative %: 75 %
Platelet Count: 330 10*3/uL (ref 150–400)
RBC: 2.9 MIL/uL — ABNORMAL LOW (ref 3.87–5.11)
RDW: 15.1 % (ref 11.5–15.5)
WBC Count: 7.4 10*3/uL (ref 4.0–10.5)
nRBC: 0 % (ref 0.0–0.2)

## 2022-10-30 LAB — CMP (CANCER CENTER ONLY)
ALT: 6 U/L (ref 0–44)
AST: 14 U/L — ABNORMAL LOW (ref 15–41)
Albumin: 4 g/dL (ref 3.5–5.0)
Alkaline Phosphatase: 113 U/L (ref 38–126)
Anion gap: 8 (ref 5–15)
BUN: 27 mg/dL — ABNORMAL HIGH (ref 8–23)
CO2: 31 mmol/L (ref 22–32)
Calcium: 9.4 mg/dL (ref 8.9–10.3)
Chloride: 105 mmol/L (ref 98–111)
Creatinine: 1.39 mg/dL — ABNORMAL HIGH (ref 0.44–1.00)
GFR, Estimated: 40 mL/min — ABNORMAL LOW (ref >60)
Glucose, Bld: 138 mg/dL — ABNORMAL HIGH (ref 70–99)
Potassium: 3.8 mmol/L (ref 3.5–5.1)
Sodium: 144 mmol/L (ref 135–145)
Total Bilirubin: 0.5 mg/dL (ref 0.3–1.2)
Total Protein: 7.1 g/dL (ref 6.5–8.1)

## 2022-10-30 LAB — IRON AND IRON BINDING CAPACITY (CC-WL,HP ONLY)
Iron: 197 ug/dL — ABNORMAL HIGH (ref 28–170)
Saturation Ratios: 47 % — ABNORMAL HIGH (ref 10.4–31.8)
TIBC: 420 ug/dL (ref 250–450)
UIBC: 223 ug/dL (ref 148–442)

## 2022-10-30 LAB — RETIC PANEL
Immature Retic Fract: 27.2 % — ABNORMAL HIGH (ref 2.3–15.9)
RBC.: 2.87 MIL/uL — ABNORMAL LOW (ref 3.87–5.11)
Retic Count, Absolute: 72 10*3/uL (ref 19.0–186.0)
Retic Ct Pct: 2.5 % (ref 0.4–3.1)
Reticulocyte Hemoglobin: 23.5 pg — ABNORMAL LOW (ref >27.9)

## 2022-10-30 LAB — FERRITIN: Ferritin: 19 ng/mL (ref 11–307)

## 2022-10-30 NOTE — Progress Notes (Signed)
[facility] [phone]   [phone]  PROGRESS NOTE  Patient Care Team: Gwenyth Bender, MD as PCP - General (Internal Medicine)  Hematological/Oncological History # Iron Deficiency Anemia due to Chronic Blood Loss # Sickle Cell Trait  1) 03/26/2009: Hgb 7.2, first Hgb on record 2) 09/02/2011: 2 units of PRBC at [facility] (Hgb noted at 8.9).  3)  12/03/2011: colonoscopy, found colon polyp and sigmoid diverticula. 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 Underwent argon plasma coagulation  4) Jan 2018: colonoscopy with 2 tubular adenomas removed. Small colonic AVM and small internal hemorrhoids.  5)10/24/2017: Hgb 7.7, MCV 71, Plt 338, and WBC 6.7. POEM procedure for Achalasia. duodenal AVM fulgurated.  6) 03/2018: Hgb 8.2.  7) March 2020: capsule endoscopy: nonbleeding gastric and proximal small bowel AVM,  8) 04/26/2019: Establish Care with Dr. Leonides Schanz  WBC 6.1, Hgb 9.3, MCV 75.3, Plt 289 9) 05/17/2019: Underwent EGD which showed esophagitis and small non-bleeding duodenal angiodysplasias. 10) 07/27/2019: WBC 5.9, Hgb 9.8, MCV 77.8, Plt 271. Recommend IV iron based on labs. Received feraheme 510mg  IV q7 days x 2 doses on 3/8 and 08/15/2019. Hgb electrophoresis confirms sickle cell trait.  11) 10/24/2019: WBC 5.4, Hgb 10.2, MCV 80.5, Plt 252 12) 02/15/2020: WBC 5.1, Hgb 9.7, MCV 80.2, Plt 280 13) 9/24-10/06/2019: IV feraheme 510mg  x 2 doses 14) 04/22/2021: Patient presented to emergency department with heavy nosebleeding.  Had a syncopal episode in the emergency department.  Discharged on 04/24/2021. Hgb 7.2 on admission.  15) 07/05/2021: presented to clinic with Hgb 6.2, MCV 88.9 and Plt 286. Admitted for GI workup.  16) 11/27/2021: colonscopy showed a single small localized angioectasia which was ablated. Small bowel endoscopy showed 5 angioectasias treated with APC  Interval History:  Lacey Vega 72 y.o. female with medical history  significant for iron deficiency anemia 2/2 to chronic blood loss/ sickle cell trait who presents for a follow up visit. The patient's last visit was on 07/08/2022. In the interim since the last visit Lacey Vega has had no major changes in her health.  On exam today Lacey Vega notes she feels unwell.  She notes that she has "not been good".  She is tired and short of breath.  She is not having any dizziness or lightheadedness but is having some wobbling when walking.  She notes that she is also having increased pain in her lower back.  She reports that if she starts moving around she can get "really short of breath".  She notes that her stools are dark and can be sticky and tarry.  She notes that she is taking iron pills well without any difficulty such as abdominal discomfort or nausea/constipation.  She reports that she has a very poor appetite and can "go all day without eating".  She does that she is doing her best to try to keep up with hydration.  She otherwise denies any fevers, chills, sweats, nausea, vomiting or diarrhea.  A full 10 point ROS is listed below.  MEDICAL HISTORY:  Past Medical History:  Diagnosis Date   Anemia    Anxiety    Asthma    Asthma    Bronchitis    hx   Colon polyps    hyperplastic   Diabetes (HCC)    mild, history of   DJD (degenerative joint disease), cervical    Dysphagia    history of   Dyspnea    Gallstones    GERD (gastroesophageal reflux  disease)    H/O chest pain    secondary to anemia   Headache    History of arteriovenous malformation (AVM)    History of blood transfusion 10/2021   History of hiatal hernia    History of tobacco use    Hyperlipemia    Hypertension    Hypothyroidism    Iron deficiency anemia    Sickle cell anemia (HCC)    "patient is not aware of this"   Thyroid disease    TIA (transient ischemic attack)    "was told that she possible had one"   Wears dentures    Wears glasses     SURGICAL HISTORY: Past Surgical History:   Procedure Laterality Date   ABDOMINAL AORTIC ENDOVASCULAR STENT GRAFT Bilateral 01/31/2021   Procedure: ENDOVASCULAR ANEURYSM REPAIR (EVAR)Bilateral Groin Cutdown, left femoral endaterectomy with bovine patch angioplasty.;  Surgeon: Leonie Douglas, MD;  Location: [facility] OR;  Service: Vascular;  Laterality: Bilateral;   ABDOMINAL HYSTERECTOMY     ANTERIOR CERVICAL DECOMP/DISCECTOMY FUSION N/A 10/03/2016   Procedure: ANTERIOR CERVICAL DECOMPRESSION/DISCECTOMY FUSION CERVICAL THREE- CERVICAL FOUR;  Surgeon: Coletta Memos, MD;  Location: [facility] OR;  Service: Neurosurgery;  Laterality: N/A;  Right side approach   BOTOX INJECTION  08/29/2015   Procedure: BOTOX INJECTION;  Surgeon: Charlott Rakes, MD;  Location: [facility] ENDOSCOPY;  Service: Endoscopy;;   CHOLECYSTECTOMY     COLONOSCOPY  12/03/2011   Procedure: COLONOSCOPY;  Surgeon: Shirley Friar, MD;  Location: [facility] ENDOSCOPY;  Service: Endoscopy;  Laterality: N/A;   COLONOSCOPY N/A 11/27/2021   Procedure: COLONOSCOPY;  Surgeon: Imogene Burn, MD;  Location: [facility] ENDOSCOPY;  Service: Gastroenterology;  Laterality: N/A;   ENTEROSCOPY N/A 07/07/2021   Procedure: ENTEROSCOPY;  Surgeon: Vida Rigger, MD;  Location: [facility] ENDOSCOPY;  Service: Endoscopy;  Laterality: N/A;   ENTEROSCOPY N/A 08/06/2021   Procedure: ENTEROSCOPY;  Surgeon: Kerin Salen, MD;  Location: [facility] ENDOSCOPY;  Service: Gastroenterology;  Laterality: N/A;   ENTEROSCOPY N/A 11/27/2021   Procedure: SMALL  BOWEL ENTEROSCOPY;  Surgeon: Imogene Burn, MD;  Location: [facility] ENDOSCOPY;  Service: Gastroenterology;  Laterality: N/A;   ESOPHAGEAL MANOMETRY N/A 07/13/2017   Procedure: ESOPHAGEAL MANOMETRY (EM);  Surgeon: Charlott Rakes, MD;  Location: [facility] ENDOSCOPY;  Service: Endoscopy;  Laterality: N/A;   ESOPHAGOGASTRODUODENOSCOPY  12/03/2011   Procedure: ESOPHAGOGASTRODUODENOSCOPY (EGD);  Surgeon: Shirley Friar, MD;  Location: [facility] ENDOSCOPY;  Service: Endoscopy;  Laterality: N/A;   ESOPHAGOGASTRODUODENOSCOPY (EGD)  WITH PROPOFOL N/A 08/29/2015   Procedure: ESOPHAGOGASTRODUODENOSCOPY (EGD) WITH PROPOFOL;  Surgeon: Charlott Rakes, MD;  Location: [facility] ENDOSCOPY;  Service: Endoscopy;  Laterality: N/A;   ESOPHAGOGASTRODUODENOSCOPY (EGD) WITH PROPOFOL N/A 05/17/2019   Procedure: ESOPHAGOGASTRODUODENOSCOPY (EGD) WITH PROPOFOL;  Surgeon: Charlott Rakes, MD;  Location: [facility] ENDOSCOPY;  Service: Endoscopy;  Laterality: N/A;   ESOPHAGOGASTRODUODENOSCOPY (EGD) WITH PROPOFOL N/A 04/24/2021   Procedure: ESOPHAGOGASTRODUODENOSCOPY (EGD) WITH PROPOFOL;  Surgeon: Kathi Der, MD;  Location: [facility] ENDOSCOPY;  Service: Gastroenterology;  Laterality: N/A;   GIVENS CAPSULE STUDY N/A 08/05/2018   Procedure: GIVENS CAPSULE STUDY;  Surgeon: Charlott Rakes, MD;  Location: [facility] ENDOSCOPY;  Service: Endoscopy;  Laterality: N/A;   GIVENS CAPSULE STUDY N/A 07/06/2021   Procedure: GIVENS CAPSULE STUDY;  Surgeon: Vida Rigger, MD;  Location: [facility] ENDOSCOPY;  Service: Endoscopy;  Laterality: N/A;   HOT HEMOSTASIS  12/03/2011   Procedure: HOT HEMOSTASIS (ARGON PLASMA COAGULATION/BICAP);  Surgeon: Shirley Friar, MD;  Location: [facility] ENDOSCOPY;  Service: Endoscopy;  Laterality: N/A;   HOT HEMOSTASIS N/A 07/07/2021  Procedure: HOT HEMOSTASIS (ARGON PLASMA COAGULATION/BICAP);  Surgeon: Vida Rigger, MD;  Location: [facility] ENDOSCOPY;  Service: Endoscopy;  Laterality: N/A;   HOT HEMOSTASIS N/A 08/06/2021   Procedure: HOT HEMOSTASIS (ARGON PLASMA COAGULATION/BICAP);  Surgeon: Kerin Salen, MD;  Location: [facility] ENDOSCOPY;  Service: Gastroenterology;  Laterality: N/A;   HOT HEMOSTASIS  11/27/2021   Procedure: HOT HEMOSTASIS (ARGON PLASMA COAGULATION/BICAP);  Surgeon: Imogene Burn, MD;  Location: [facility] ENDOSCOPY;  Service: Gastroenterology;;  EGD and COLON   NECK SURGERY     PERIPHERAL VASCULAR INTERVENTION  03/29/2021   Procedure: PERIPHERAL VASCULAR INTERVENTION;  Surgeon: Leonie Douglas, MD;  Location: [facility] INVASIVE CV LAB;  Service: Cardiovascular;;  Lt SFA  Brachial Approach   POLYPECTOMY  11/27/2021   Procedure: POLYPECTOMY;  Surgeon: Imogene Burn, MD;  Location: [facility] ENDOSCOPY;  Service: Gastroenterology;;   ROTATOR CUFF REPAIR     right   ROTATOR CUFF REPAIR Right 2014   THROMBECTOMY FEMORAL ARTERY Left 01/31/2021   Procedure: THROMBECTOMY POPLITEAL  ARTERY;  Surgeon: Leonie Douglas, MD;  Location: [facility] OR;  Service: Vascular;  Laterality: Left;   TONSILLECTOMY      SOCIAL HISTORY: Social History   Socioeconomic History   Marital status: Divorced    Spouse name: Not on file   Number of children: 2   Years of education: Not on file   Highest education level: Not on file  Occupational History   Occupation: group leader environmental services    Employer: [employer]    Comment: [employer]  Tobacco Use   Smoking status: Former    Types: Cigarettes    Quit date: 06/02/2000    Years since quitting: 22.4   Smokeless tobacco: Never  Vaping Use   Vaping Use: Never used  Substance and Sexual Activity   Alcohol use: No   Drug use: No   Sexual activity: Not on file  Other Topics Concern   Not on file  Social History Narrative   ** Merged History Encounter **       Social Determinants of Health   Financial Resource Strain: Not on file  Food Insecurity: Not on file  Transportation Needs: Not on file  Physical Activity: Not on file  Stress: Not on file  Social Connections: Not on file  Intimate Partner Violence: Not on file    FAMILY HISTORY: Family History  Problem Relation Age of Onset   Diabetes Mother    Heart attack Brother    Cancer Maternal Aunt     ALLERGIES:  is allergic to oxycodone.  MEDICATIONS:  Current Outpatient Medications  Medication Sig Dispense Refill   acetaminophen (TYLENOL) 500 MG tablet Take 500-1,000 mg by mouth every 6 (six) hours as needed for mild pain or headache.     albuterol (VENTOLIN HFA) 108 (90 Base) MCG/ACT inhaler Inhale 1-2 puffs into the lungs every 6 (six) hours as  needed for wheezing or shortness of breath.     aspirin EC 81 MG tablet Take 1 tablet (81 mg total) by mouth daily. **Start on 11/25** (Patient taking differently: Take 81 mg by mouth daily.) 30 tablet 11   atorvastatin (LIPITOR) 40 MG tablet Take 40 mg by mouth at bedtime.     carisoprodol (SOMA) 250 MG tablet Take 250 mg by mouth 2 (two) times daily as needed (cramps).      Cinnamon 500 MG TABS Take 1,000 mg by mouth every evening.     clopidogrel (PLAVIX) 75 MG tablet TAKE  1 TABLET BY MOUTH EVERY DAY 90 tablet 3   donepezil (ARICEPT) 5 MG tablet Take 5 mg by mouth at bedtime.     ferrous sulfate 325 (65 FE) MG tablet TAKE 1 TABLET (325 MG TOTAL) BY MOUTH DAILY WITH BREAKFAST. WITH ORANGE JUICE 90 tablet 1   folic acid (FOLVITE) 800 MCG tablet Take 800 mcg by mouth daily.     furosemide (LASIX) 20 MG tablet Take 20 mg by mouth daily.      levothyroxine (SYNTHROID) 100 MCG tablet Take 100 mcg by mouth daily.     linaclotide (LINZESS) 145 MCG CAPS capsule Take 1 capsule (145 mcg total) by mouth daily before breakfast. 30 capsule 6   Magnesium Oxide 420 (252 Mg) MG TABS Take 420-840 mg by mouth as needed (for cramps).     montelukast (SINGULAIR) 10 MG tablet Take 10 mg by mouth every evening.     Multiple Vitamins-Minerals (HAIR/SKIN/NAILS) CAPS Take 5,000 mcg by mouth daily.     Na Sulfate-K Sulfate-Mg Sulf (SUPREP BOWEL PREP KIT) 17.5-3.13-1.6 GM/177ML SOLN Take 1 kit by mouth as directed. 324 mL 0   nitroGLYCERIN (NITROSTAT) 0.4 MG SL tablet Place 0.4 mg under the tongue every 5 (five) minutes as needed for chest pain.     octreotide (SANDOSTATIN) 50 MCG/ML SOLN injection Inject 1 mL (50 mcg total) into the skin 2 (two) times daily. 60 mL 0   Omega-3 Fatty Acids (FISH OIL BURP-LESS) 1200 MG CAPS Take 1,000 mg by mouth every evening.     pantoprazole (PROTONIX) 40 MG tablet Take 1 tablet (40 mg total) by mouth daily before breakfast. 90 tablet 1   vitamin B-12 (CYANOCOBALAMIN) 1000 MCG tablet  Take 1,000 mcg by mouth daily.     No current facility-administered medications for this visit.   Facility-Administered Medications Ordered in Other Visits  Medication Dose Route Frequency Provider Last Rate Last Admin   0.9 %  sodium chloride infusion   Intravenous Continuous Charlott Rakes, MD        REVIEW OF SYSTEMS:   Constitutional: ( - ) fevers, ( - )  chills , ( - ) night sweats (+) fatigue Eyes: ( - ) blurriness of vision, ( - ) double vision, ( - ) watery eyes Ears, nose, mouth, throat, and face: ( - ) mucositis, ( - ) sore throat Respiratory: ( - ) cough, ( - ) dyspnea, ( - ) wheezes Cardiovascular: ( - ) palpitation, ( - ) chest discomfort, ( - ) lower extremity swelling Gastrointestinal:  ( - ) nausea, ( - ) heartburn, ( - ) change in bowel habits Skin: ( - ) abnormal skin rashes Lymphatics: ( - ) new lymphadenopathy, ( - ) easy bruising Neurological: ( - ) numbness, ( - ) tingling, ( - ) new weaknesses Behavioral/Psych: ( - ) mood change, ( - ) new changes  All other systems were reviewed with the patient and are negative.  PHYSICAL EXAMINATION: ECOG PERFORMANCE STATUS: 1 - Symptomatic but completely ambulatory  Vitals:   10/30/22 1414  BP: (!) 119/56  Pulse: 92  Resp: 17  Temp: 98.1 F (36.7 C)  SpO2: 99%   Filed Weights   10/30/22 1414  Weight: 179 lb 4.8 oz (81.3 kg)    GENERAL: well appearing elderly African American female in NAD  SKIN: skin color, texture, turgor are normal, no rashes or significant lesions EYES: conjunctiva are pink and non-injected, sclera clear LUNGS: clear to auscultation and percussion with normal breathing  effort HEART: regular rate & rhythm and no murmurs and no lower extremity edema Musculoskeletal: no cyanosis of digits and no clubbing  PSYCH: alert & oriented x 3, fluent speech NEURO: no focal motor/sensory deficits  LABORATORY DATA:  I have reviewed the data as listed    Latest Ref Rng & Units 10/30/2022    1:56 PM  07/08/2022   11:32 AM 01/13/2022   10:24 AM  CBC  WBC 4.0 - 10.5 K/uL 7.4  4.3  5.1   Hemoglobin 12.0 - 15.0 g/dL 7.1  78.2  9.2   Hematocrit 36.0 - 46.0 % 23.0  33.1  29.1   Platelets 150 - 400 K/uL 330  294.0  301        Latest Ref Rng & Units 10/30/2022    1:56 PM 01/13/2022   10:24 AM 12/16/2021   10:33 AM  CMP  Glucose 70 - 99 mg/dL 956  213  086   BUN 8 - 23 mg/dL 27  13  12    Creatinine 0.44 - 1.00 mg/dL 5.78  4.69  6.29   Sodium 135 - 145 mmol/L 144  142  144   Potassium 3.5 - 5.1 mmol/L 3.8  3.5  2.8   Chloride 98 - 111 mmol/L 105  107  103   CO2 22 - 32 mmol/L 31  32  34   Calcium 8.9 - 10.3 mg/dL 9.4  8.9  9.3   Total Protein 6.5 - 8.1 g/dL 7.1  7.0  7.2   Total Bilirubin 0.3 - 1.2 mg/dL 0.5  0.7  0.9   Alkaline Phos 38 - 126 U/L 113  107  103   AST 15 - 41 U/L 14  15  19    ALT 0 - 44 U/L 6  10  12      RADIOGRAPHIC STUDIES: None relevant to review.  No results found.  ASSESSMENT & PLAN Ladesha Goldberg 72 y.o. female with medical history significant for iron deficiency anemia 2/2 to chronic blood loss presents for a follow up visit.  Ms. Bruegger establish care with [place] in November 2020 and has been taking p.o. iron since that time.  When her levels failed to improve at her last visit she was started on IV Iron therapy and received IV feraheme 510mg  x 2 doses on 3/8 and 08/15/19. She received additional doses on 9/24 and 03/02/2020.  She had a hospital admission from 04/22/2021 until 04/24/2021 for epistaxis.  In clinic on 05/09/2019 the patient was found to have hemoglobin 6.3.  Transfusion was arranged for 2 units packed red blood cells on 05/09/2021.Transfusion was required again in Feb 2023 with admission. Additional 2 units of PRBC given on 10/14/2021.   #Iron Deficiency Anemia from Chronic Blood Loss --continue PO ferrous sulfide 325mg  daily with source of vitamin C. --source of blood loss previously identified as AVMs in the GI tract. Likely a strong contribution from  the patient's sickle cell trait preventing her from reaching a normal Hgb. Baseline Hgb is approximately 10.  --transfusion goal for Hgb <7.0. Hgb today at 7.1 --patient received IV feraheme 510mg  x 2 dose on 9/24-10/06/2019.  --received 5 doses of IV iron sucorse from 10/29/2021 to 11/26/2021.  --will plan for another round of IV iron next week.  -- GI notified of patient's low Hgb and dark stools.  --RTC in 4 weeks with interval 1 week lab check/IV iron.    #Sickle Cell Trait -- Hgb electrophoresis confirmed sickle cell trait on 07/27/2019. --like  a strong contributing factor to the patients persistently low Hgb. Most likely her baseline hemoglobin is approximately 10.0   #Bleeding AVM/GI Bleeding --colonscopy and small bowel endoscopy performed on 11/27/2021 --small bowel endoscopy showed 5 angioectasias treated with APC --colonscopy showed 1 angioectasias --GI aware of her last Hgb drop.   No orders of the defined types were placed in this encounter.  All questions were answered. The patient knows to call the clinic with any problems, questions or concerns.  A total of more than 30 minutes were spent on this encounter and over half of that time was spent on counseling and coordination of care as outlined above.   Ulysees Barns, MD Department of Hematology/Oncology [facility] Phone: [phone] Pager: [phone] Email: [email or web address]  10/30/2022 2:57 PM

## 2022-10-31 ENCOUNTER — Telehealth: Payer: Self-pay

## 2022-10-31 ENCOUNTER — Other Ambulatory Visit: Payer: Self-pay

## 2022-10-31 DIAGNOSIS — K5521 Angiodysplasia of colon with hemorrhage: Secondary | ICD-10-CM

## 2022-10-31 DIAGNOSIS — D500 Iron deficiency anemia secondary to blood loss (chronic): Secondary | ICD-10-CM

## 2022-10-31 NOTE — Telephone Encounter (Signed)
Spoke with the patient. Patient advised of the appointment for SBE to be done 11/19/22 at [facility]. Arrival 8/15 am. Advised of need for care partner to drive for her. Brief review of medication list. Patient is on Plavix by order of Dr Payton Emerald.  Telephone message routed to Dr Payton Emerald asking for clearance to hold Plavix 5 to 7 days. Written instructions mailed to the patient.  Ambulatory referral placed.

## 2022-10-31 NOTE — Telephone Encounter (Signed)
Pre-operative Risk Assessment     Request for surgical clearance:     Endoscopy Procedure  What type of surgery is being performed?     11/19/22  When is this surgery scheduled?     enteroscopy  What type of clearance is required ?   Pharmacy  Are there any medications that need to be held prior to surgery and how long? Plavix to be held for 5 to 7 days  Practice name and name of physician performing surgery?      [facility]  What is your office phone and fax number?      Phone- [phone]  Fax- [phone]  Anesthesia type (None, local, MAC, general) ?       MAC

## 2022-11-03 ENCOUNTER — Telehealth: Payer: Self-pay | Admitting: Hematology and Oncology

## 2022-11-03 ENCOUNTER — Encounter: Payer: Self-pay | Admitting: Hematology and Oncology

## 2022-11-04 NOTE — Telephone Encounter (Signed)
Clearance request faxed to the office of Dr Lemar Livings.

## 2022-11-05 ENCOUNTER — Other Ambulatory Visit: Payer: Self-pay | Admitting: *Deleted

## 2022-11-05 ENCOUNTER — Inpatient Hospital Stay: Payer: Medicare HMO | Attending: Hematology and Oncology

## 2022-11-05 ENCOUNTER — Inpatient Hospital Stay: Payer: Medicare HMO

## 2022-11-05 ENCOUNTER — Other Ambulatory Visit: Payer: Self-pay

## 2022-11-05 ENCOUNTER — Encounter (HOSPITAL_COMMUNITY): Payer: Self-pay

## 2022-11-05 ENCOUNTER — Encounter (HOSPITAL_COMMUNITY): Payer: Self-pay | Admitting: Internal Medicine

## 2022-11-05 ENCOUNTER — Inpatient Hospital Stay (HOSPITAL_COMMUNITY)
Admission: AD | Admit: 2022-11-05 | Discharge: 2022-11-08 | DRG: 378 | Disposition: A | Discharge status: Home / Self Care | Payer: Medicare HMO | Source: Ambulatory Visit | Attending: Internal Medicine | Admitting: Internal Medicine

## 2022-11-05 VITALS — BP 117/54 | HR 78 | Temp 98.6°F | Resp 14

## 2022-11-05 DIAGNOSIS — Z8711 Personal history of peptic ulcer disease: Secondary | ICD-10-CM

## 2022-11-05 DIAGNOSIS — Z6828 Body mass index (BMI) 28.0-28.9, adult: Secondary | ICD-10-CM | POA: Diagnosis not present

## 2022-11-05 DIAGNOSIS — E1122 Type 2 diabetes mellitus with diabetic chronic kidney disease: Secondary | ICD-10-CM | POA: Diagnosis present

## 2022-11-05 DIAGNOSIS — Z79890 Hormone replacement therapy (postmenopausal): Secondary | ICD-10-CM

## 2022-11-05 DIAGNOSIS — K31819 Angiodysplasia of stomach and duodenum without bleeding: Secondary | ICD-10-CM | POA: Diagnosis not present

## 2022-11-05 DIAGNOSIS — T45525A Adverse effect of antithrombotic drugs, initial encounter: Secondary | ICD-10-CM | POA: Diagnosis present

## 2022-11-05 DIAGNOSIS — Z8673 Personal history of transient ischemic attack (TIA), and cerebral infarction without residual deficits: Secondary | ICD-10-CM

## 2022-11-05 DIAGNOSIS — I10 Essential (primary) hypertension: Secondary | ICD-10-CM | POA: Diagnosis not present

## 2022-11-05 DIAGNOSIS — D500 Iron deficiency anemia secondary to blood loss (chronic): Secondary | ICD-10-CM

## 2022-11-05 DIAGNOSIS — Z8719 Personal history of other diseases of the digestive system: Secondary | ICD-10-CM

## 2022-11-05 DIAGNOSIS — E1151 Type 2 diabetes mellitus with diabetic peripheral angiopathy without gangrene: Secondary | ICD-10-CM | POA: Diagnosis not present

## 2022-11-05 DIAGNOSIS — I129 Hypertensive chronic kidney disease with stage 1 through stage 4 chronic kidney disease, or unspecified chronic kidney disease: Secondary | ICD-10-CM | POA: Diagnosis present

## 2022-11-05 DIAGNOSIS — K5521 Angiodysplasia of colon with hemorrhage: Secondary | ICD-10-CM | POA: Diagnosis present

## 2022-11-05 DIAGNOSIS — Z7902 Long term (current) use of antithrombotics/antiplatelets: Secondary | ICD-10-CM

## 2022-11-05 DIAGNOSIS — I714 Abdominal aortic aneurysm, without rupture, unspecified: Secondary | ICD-10-CM | POA: Diagnosis present

## 2022-11-05 DIAGNOSIS — K922 Gastrointestinal hemorrhage, unspecified: Principal | ICD-10-CM | POA: Diagnosis present

## 2022-11-05 DIAGNOSIS — Z86010 Personal history of colonic polyps: Secondary | ICD-10-CM

## 2022-11-05 DIAGNOSIS — D6832 Hemorrhagic disorder due to extrinsic circulating anticoagulants: Secondary | ICD-10-CM | POA: Diagnosis present

## 2022-11-05 DIAGNOSIS — K31811 Angiodysplasia of stomach and duodenum with bleeding: Principal | ICD-10-CM | POA: Diagnosis present

## 2022-11-05 DIAGNOSIS — J449 Chronic obstructive pulmonary disease, unspecified: Secondary | ICD-10-CM | POA: Diagnosis not present

## 2022-11-05 DIAGNOSIS — J45909 Unspecified asthma, uncomplicated: Secondary | ICD-10-CM | POA: Diagnosis present

## 2022-11-05 DIAGNOSIS — E669 Obesity, unspecified: Secondary | ICD-10-CM | POA: Diagnosis present

## 2022-11-05 DIAGNOSIS — K219 Gastro-esophageal reflux disease without esophagitis: Secondary | ICD-10-CM | POA: Diagnosis present

## 2022-11-05 DIAGNOSIS — D638 Anemia in other chronic diseases classified elsewhere: Secondary | ICD-10-CM | POA: Diagnosis not present

## 2022-11-05 DIAGNOSIS — D62 Acute posthemorrhagic anemia: Secondary | ICD-10-CM | POA: Diagnosis present

## 2022-11-05 DIAGNOSIS — Z833 Family history of diabetes mellitus: Secondary | ICD-10-CM | POA: Diagnosis not present

## 2022-11-05 DIAGNOSIS — D573 Sickle-cell trait: Secondary | ICD-10-CM | POA: Diagnosis present

## 2022-11-05 DIAGNOSIS — Z8249 Family history of ischemic heart disease and other diseases of the circulatory system: Secondary | ICD-10-CM

## 2022-11-05 DIAGNOSIS — Z981 Arthrodesis status: Secondary | ICD-10-CM

## 2022-11-05 DIAGNOSIS — D509 Iron deficiency anemia, unspecified: Secondary | ICD-10-CM | POA: Diagnosis present

## 2022-11-05 DIAGNOSIS — Z9049 Acquired absence of other specified parts of digestive tract: Secondary | ICD-10-CM

## 2022-11-05 DIAGNOSIS — K6389 Other specified diseases of intestine: Secondary | ICD-10-CM | POA: Diagnosis not present

## 2022-11-05 DIAGNOSIS — N1831 Chronic kidney disease, stage 3a: Secondary | ICD-10-CM | POA: Diagnosis present

## 2022-11-05 DIAGNOSIS — Z87891 Personal history of nicotine dependence: Secondary | ICD-10-CM | POA: Diagnosis not present

## 2022-11-05 DIAGNOSIS — Z7982 Long term (current) use of aspirin: Secondary | ICD-10-CM

## 2022-11-05 DIAGNOSIS — Z8679 Personal history of other diseases of the circulatory system: Secondary | ICD-10-CM | POA: Diagnosis not present

## 2022-11-05 DIAGNOSIS — E785 Hyperlipidemia, unspecified: Secondary | ICD-10-CM | POA: Diagnosis present

## 2022-11-05 DIAGNOSIS — E039 Hypothyroidism, unspecified: Secondary | ICD-10-CM | POA: Diagnosis present

## 2022-11-05 DIAGNOSIS — Z90710 Acquired absence of both cervix and uterus: Secondary | ICD-10-CM

## 2022-11-05 DIAGNOSIS — Z79899 Other long term (current) drug therapy: Secondary | ICD-10-CM

## 2022-11-05 LAB — SAMPLE TO BLOOD BANK

## 2022-11-05 LAB — CMP (CANCER CENTER ONLY)
ALT: 6 U/L (ref 0–44)
AST: 12 U/L — ABNORMAL LOW (ref 15–41)
Albumin: 3.8 g/dL (ref 3.5–5.0)
Alkaline Phosphatase: 109 U/L (ref 38–126)
Anion gap: 8 (ref 5–15)
BUN: 22 mg/dL (ref 8–23)
CO2: 27 mmol/L (ref 22–32)
Calcium: 9 mg/dL (ref 8.9–10.3)
Chloride: 110 mmol/L (ref 98–111)
Creatinine: 1.21 mg/dL — ABNORMAL HIGH (ref 0.44–1.00)
GFR, Estimated: 48 mL/min — ABNORMAL LOW (ref >60)
Glucose, Bld: 120 mg/dL — ABNORMAL HIGH (ref 70–99)
Potassium: 3.2 mmol/L — ABNORMAL LOW (ref 3.5–5.1)
Sodium: 145 mmol/L (ref 135–145)
Total Bilirubin: 0.5 mg/dL (ref 0.3–1.2)
Total Protein: 6.7 g/dL (ref 6.5–8.1)

## 2022-11-05 LAB — CBC WITH DIFFERENTIAL (CANCER CENTER ONLY)
Abs Immature Granulocytes: 0.02 10*3/uL (ref 0.00–0.07)
Basophils Absolute: 0 10*3/uL (ref 0.0–0.1)
Basophils Relative: 1 %
Eosinophils Absolute: 0.2 10*3/uL (ref 0.0–0.5)
Eosinophils Relative: 3 %
HCT: 21.3 % — ABNORMAL LOW (ref 36.0–46.0)
Hemoglobin: 6.3 g/dL — CL (ref 12.0–15.0)
Immature Granulocytes: 0 %
Lymphocytes Relative: 14 %
Lymphs Abs: 0.8 10*3/uL (ref 0.7–4.0)
MCH: 23.9 pg — ABNORMAL LOW (ref 26.0–34.0)
MCHC: 29.6 g/dL — ABNORMAL LOW (ref 30.0–36.0)
MCV: 80.7 fL (ref 80.0–100.0)
Monocytes Absolute: 0.6 10*3/uL (ref 0.1–1.0)
Monocytes Relative: 11 %
Neutro Abs: 4.1 10*3/uL (ref 1.7–7.7)
Neutrophils Relative %: 71 %
Platelet Count: 353 10*3/uL (ref 150–400)
RBC: 2.64 MIL/uL — ABNORMAL LOW (ref 3.87–5.11)
RDW: 15.9 % — ABNORMAL HIGH (ref 11.5–15.5)
WBC Count: 5.7 10*3/uL (ref 4.0–10.5)
nRBC: 0 % (ref 0.0–0.2)

## 2022-11-05 LAB — PREPARE RBC (CROSSMATCH)

## 2022-11-05 MED ORDER — LEVOTHYROXINE SODIUM 25 MCG PO TABS
125.0000 ug | ORAL_TABLET | Freq: Every day | ORAL | Status: DC
  Administered 2022-11-06 – 2022-11-08 (×3): 125 ug via ORAL
  Filled 2022-11-05 (×2): qty 1

## 2022-11-05 MED ORDER — MONTELUKAST SODIUM 10 MG PO TABS
10.0000 mg | ORAL_TABLET | Freq: Every day | ORAL | Status: DC
  Administered 2022-11-05 – 2022-11-07 (×3): 10 mg via ORAL
  Filled 2022-11-05 (×3): qty 1

## 2022-11-05 MED ORDER — POTASSIUM CHLORIDE CRYS ER 20 MEQ PO TBCR
40.0000 meq | EXTENDED_RELEASE_TABLET | Freq: Once | ORAL | Status: AC
  Administered 2022-11-05: 40 meq via ORAL
  Filled 2022-11-05: qty 2

## 2022-11-05 MED ORDER — PANTOPRAZOLE SODIUM 40 MG PO TBEC
40.0000 mg | DELAYED_RELEASE_TABLET | Freq: Every day | ORAL | Status: DC
  Administered 2022-11-06 – 2022-11-08 (×3): 40 mg via ORAL
  Filled 2022-11-05 (×3): qty 1

## 2022-11-05 MED ORDER — ALBUTEROL SULFATE (2.5 MG/3ML) 0.083% IN NEBU: 2.5000 mg | INHALATION_SOLUTION | Freq: Four times a day (QID) | RESPIRATORY_TRACT | Status: DC | PRN

## 2022-11-05 MED ORDER — FOLIC ACID 1 MG PO TABS
1.0000 mg | ORAL_TABLET | Freq: Every day | ORAL | Status: DC
  Administered 2022-11-06 – 2022-11-08 (×3): 1 mg via ORAL
  Filled 2022-11-05 (×3): qty 1

## 2022-11-05 MED ORDER — ALBUTEROL SULFATE (2.5 MG/3ML) 0.083% IN NEBU
2.5000 mg | INHALATION_SOLUTION | Freq: Four times a day (QID) | RESPIRATORY_TRACT | Status: DC
  Administered 2022-11-05: 2.5 mg via RESPIRATORY_TRACT
  Filled 2022-11-05: qty 3

## 2022-11-05 MED ORDER — ACETAMINOPHEN 650 MG RE SUPP: 650.0000 mg | Freq: Four times a day (QID) | RECTAL | Status: DC | PRN

## 2022-11-05 MED ORDER — ATORVASTATIN CALCIUM 40 MG PO TABS
40.0000 mg | ORAL_TABLET | Freq: Every day | ORAL | Status: DC
  Administered 2022-11-05 – 2022-11-07 (×3): 40 mg via ORAL
  Filled 2022-11-05 (×3): qty 1

## 2022-11-05 MED ORDER — LEVOTHYROXINE SODIUM 100 MCG PO TABS: 100.0000 ug | ORAL_TABLET | Freq: Every day | ORAL | Status: DC

## 2022-11-05 MED ORDER — FERROUS SULFATE 325 (65 FE) MG PO TABS
325.0000 mg | ORAL_TABLET | Freq: Every day | ORAL | Status: DC
  Administered 2022-11-06 – 2022-11-08 (×3): 325 mg via ORAL
  Filled 2022-11-05 (×3): qty 1

## 2022-11-05 MED ORDER — HYDRALAZINE HCL 20 MG/ML IJ SOLN: 10.0000 mg | Freq: Four times a day (QID) | INTRAMUSCULAR | Status: DC | PRN

## 2022-11-05 MED ORDER — SODIUM CHLORIDE 0.9% IV SOLUTION: Freq: Once | INTRAVENOUS | Status: AC

## 2022-11-05 MED ORDER — VITAMIN B-12 1000 MCG PO TABS
1000.0000 ug | ORAL_TABLET | Freq: Every day | ORAL | Status: DC
  Administered 2022-11-06 – 2022-11-08 (×3): 1000 ug via ORAL
  Filled 2022-11-05 (×3): qty 1

## 2022-11-05 MED ORDER — SODIUM CHLORIDE 0.9 % IV SOLN: Freq: Once | INTRAVENOUS | Status: AC

## 2022-11-05 MED ORDER — ACETAMINOPHEN 325 MG PO TABS: 650.0000 mg | ORAL_TABLET | Freq: Four times a day (QID) | ORAL | Status: DC | PRN

## 2022-11-05 MED ORDER — SODIUM CHLORIDE 0.9 % IV SOLN
510.0000 mg | Freq: Once | INTRAVENOUS | Status: AC
  Administered 2022-11-05: 510 mg via INTRAVENOUS
  Filled 2022-11-05: qty 510

## 2022-11-05 NOTE — H&P (Signed)
[facility] History and Physical  Lacey Vega UUV:[id number] DOB: Sep 18, 1950 DOA: 11/05/2022 PCP: Gwenyth Bender, MD  Admitted from: Home Chief Complaint: Worsening fatigue, DOE  History of Present Illness: Lacey Vega is a 72 y.o. female with PMH significant for sickle cell trait, h/o GI AVM,  chronic blood loss with iron deficiency anemia, DM2, HTN, HLD, AAA s/p repair asthma, anxiety Patient follows up with hematologist Dr. Irene Limbo as an outpatient for periodic infusion of IV iron.   Had EGD in the past by GI Dr. Angelia Mould On aspirin and Plavix after AAA endovascular grafting in 2022 Last seen 5/30 by hematologist Dr. Irene Limbo reported DOE, worsening fatigue, increased lower back pain, dark stools, poor appetite and hydration.  Labs showed hemoglobin low at 7.1.  She was given outpatient referral to GI and was also planned for IV iron infusion today 6/5.  Today, she presented to the cancer center for IV Feraheme. Labs showed drop in hemoglobin to 6.3, platelet normal at 353 Directly admitted to [facility] at [facility]  At the time of my evaluation, patient was lying on bed.  Not in distress.  No new symptoms. afebrile, heart rate 85, blood pressure 130/58 Recently seen dark stool. Lives at home, multiple family members.  Uses a cane to walk outside her home.   Review of Systems:  All systems were reviewed and were negative unless otherwise mentioned in the HPI   Past medical history: Past Medical History:  Diagnosis Date   Anemia    Anxiety    Asthma    Asthma    Bronchitis    hx   Colon polyps    hyperplastic   Diabetes (HCC)    mild, history of   DJD (degenerative joint disease), cervical    Dysphagia    history of   Dyspnea    Gallstones    GERD (gastroesophageal reflux disease)    H/O chest pain    secondary to anemia   Headache    History of arteriovenous malformation (AVM)    History of blood transfusion 10/2021   History of hiatal hernia     History of tobacco use    Hyperlipemia    Hypertension    Hypothyroidism    Iron deficiency anemia    Sickle cell anemia (HCC)    "patient is not aware of this"   Thyroid disease    TIA (transient ischemic attack)    "was told that she possible had one"   Wears dentures    Wears glasses     Past surgical history: Past Surgical History:  Procedure Laterality Date   ABDOMINAL AORTIC ENDOVASCULAR STENT GRAFT Bilateral 01/31/2021   Procedure: ENDOVASCULAR ANEURYSM REPAIR (EVAR)Bilateral Groin Cutdown, left femoral endaterectomy with bovine patch angioplasty.;  Surgeon: Leonie Douglas, MD;  Location: [facility] OR;  Service: Vascular;  Laterality: Bilateral;   ABDOMINAL HYSTERECTOMY     ANTERIOR CERVICAL DECOMP/DISCECTOMY FUSION N/A 10/03/2016   Procedure: ANTERIOR CERVICAL DECOMPRESSION/DISCECTOMY FUSION CERVICAL THREE- CERVICAL FOUR;  Surgeon: Coletta Memos, MD;  Location: [facility] OR;  Service: Neurosurgery;  Laterality: N/A;  Right side approach   BOTOX INJECTION  08/29/2015   Procedure: BOTOX INJECTION;  Surgeon: Charlott Rakes, MD;  Location: [facility] ENDOSCOPY;  Service: Endoscopy;;   CHOLECYSTECTOMY     COLONOSCOPY  12/03/2011   Procedure: COLONOSCOPY;  Surgeon: Shirley Friar, MD;  Location: [facility] ENDOSCOPY;  Service: Endoscopy;  Laterality: N/A;   COLONOSCOPY N/A 11/27/2021   Procedure: COLONOSCOPY;  Surgeon:  Imogene Burn, MD;  Location: [facility] ENDOSCOPY;  Service: Gastroenterology;  Laterality: N/A;   ENTEROSCOPY N/A 07/07/2021   Procedure: ENTEROSCOPY;  Surgeon: Vida Rigger, MD;  Location: [facility] ENDOSCOPY;  Service: Endoscopy;  Laterality: N/A;   ENTEROSCOPY N/A 08/06/2021   Procedure: ENTEROSCOPY;  Surgeon: Kerin Salen, MD;  Location: [facility] ENDOSCOPY;  Service: Gastroenterology;  Laterality: N/A;   ENTEROSCOPY N/A 11/27/2021   Procedure: SMALL  BOWEL ENTEROSCOPY;  Surgeon: Imogene Burn, MD;  Location: [facility] ENDOSCOPY;  Service: Gastroenterology;  Laterality: N/A;   ESOPHAGEAL MANOMETRY N/A 07/13/2017    Procedure: ESOPHAGEAL MANOMETRY (EM);  Surgeon: Charlott Rakes, MD;  Location: [facility] ENDOSCOPY;  Service: Endoscopy;  Laterality: N/A;   ESOPHAGOGASTRODUODENOSCOPY  12/03/2011   Procedure: ESOPHAGOGASTRODUODENOSCOPY (EGD);  Surgeon: Shirley Friar, MD;  Location: [facility] ENDOSCOPY;  Service: Endoscopy;  Laterality: N/A;   ESOPHAGOGASTRODUODENOSCOPY (EGD) WITH PROPOFOL N/A 08/29/2015   Procedure: ESOPHAGOGASTRODUODENOSCOPY (EGD) WITH PROPOFOL;  Surgeon: Charlott Rakes, MD;  Location: [facility] ENDOSCOPY;  Service: Endoscopy;  Laterality: N/A;   ESOPHAGOGASTRODUODENOSCOPY (EGD) WITH PROPOFOL N/A 05/17/2019   Procedure: ESOPHAGOGASTRODUODENOSCOPY (EGD) WITH PROPOFOL;  Surgeon: Charlott Rakes, MD;  Location: [facility] ENDOSCOPY;  Service: Endoscopy;  Laterality: N/A;   ESOPHAGOGASTRODUODENOSCOPY (EGD) WITH PROPOFOL N/A 04/24/2021   Procedure: ESOPHAGOGASTRODUODENOSCOPY (EGD) WITH PROPOFOL;  Surgeon: Kathi Der, MD;  Location: [facility] ENDOSCOPY;  Service: Gastroenterology;  Laterality: N/A;   GIVENS CAPSULE STUDY N/A 08/05/2018   Procedure: GIVENS CAPSULE STUDY;  Surgeon: Charlott Rakes, MD;  Location: [facility] ENDOSCOPY;  Service: Endoscopy;  Laterality: N/A;   GIVENS CAPSULE STUDY N/A 07/06/2021   Procedure: GIVENS CAPSULE STUDY;  Surgeon: Vida Rigger, MD;  Location: [facility] ENDOSCOPY;  Service: Endoscopy;  Laterality: N/A;   HOT HEMOSTASIS  12/03/2011   Procedure: HOT HEMOSTASIS (ARGON PLASMA COAGULATION/BICAP);  Surgeon: Shirley Friar, MD;  Location: [facility] ENDOSCOPY;  Service: Endoscopy;  Laterality: N/A;   HOT HEMOSTASIS N/A 07/07/2021   Procedure: HOT HEMOSTASIS (ARGON PLASMA COAGULATION/BICAP);  Surgeon: Vida Rigger, MD;  Location: [facility] ENDOSCOPY;  Service: Endoscopy;  Laterality: N/A;   HOT HEMOSTASIS N/A 08/06/2021   Procedure: HOT HEMOSTASIS (ARGON PLASMA COAGULATION/BICAP);  Surgeon: Kerin Salen, MD;  Location: [facility] ENDOSCOPY;  Service: Gastroenterology;  Laterality: N/A;   HOT HEMOSTASIS  11/27/2021   Procedure: HOT  HEMOSTASIS (ARGON PLASMA COAGULATION/BICAP);  Surgeon: Imogene Burn, MD;  Location: [facility] ENDOSCOPY;  Service: Gastroenterology;;  EGD and COLON   NECK SURGERY     PERIPHERAL VASCULAR INTERVENTION  03/29/2021   Procedure: PERIPHERAL VASCULAR INTERVENTION;  Surgeon: Leonie Douglas, MD;  Location: [facility] INVASIVE CV LAB;  Service: Cardiovascular;;  Lt SFA Brachial Approach   POLYPECTOMY  11/27/2021   Procedure: POLYPECTOMY;  Surgeon: Imogene Burn, MD;  Location: [facility] ENDOSCOPY;  Service: Gastroenterology;;   ROTATOR CUFF REPAIR     right   ROTATOR CUFF REPAIR Right 2014   THROMBECTOMY FEMORAL ARTERY Left 01/31/2021   Procedure: THROMBECTOMY POPLITEAL  ARTERY;  Surgeon: Leonie Douglas, MD;  Location: [facility] OR;  Service: Vascular;  Laterality: Left;   TONSILLECTOMY      Social History:  reports that she quit smoking about 22 years ago. Her smoking use included cigarettes. She has never used smokeless tobacco. She reports that she does not drink alcohol and does not use drugs.  Allergies:  Allergies  Allergen Reactions   Oxycodone Shortness Of Breath   Oxycodone   Family history:  Family History  Problem Relation Age of Onset   Diabetes Mother    Heart attack  Brother    Cancer Maternal Aunt      Home Meds: Prior to Admission medications   Medication Sig Start Date End Date Taking? Authorizing Provider  acetaminophen (TYLENOL) 500 MG tablet Take 500-1,000 mg by mouth every 6 (six) hours as needed for mild pain or headache.    [provider]  albuterol (VENTOLIN HFA) 108 (90 Base) MCG/ACT inhaler Inhale 1-2 puffs into the lungs every 6 (six) hours as needed for wheezing or shortness of breath.    [provider]  aspirin EC 81 MG tablet Take 1 tablet (81 mg total) by mouth daily. **Start on 11/25** Patient taking differently: Take 81 mg by mouth daily. 04/26/21   Noralee Stain, DO  atorvastatin (LIPITOR) 40 MG tablet Take 40 mg by mouth at bedtime.    [provider]  carisoprodol (SOMA) 250 MG tablet Take 250 mg by mouth 2 (two) times daily as needed (cramps).  05/04/19   [provider]  Cinnamon 500 MG TABS Take 1,000 mg by mouth every evening.    [provider]  clopidogrel (PLAVIX) 75 MG tablet TAKE 1 TABLET BY MOUTH EVERY DAY 07/17/22   Maeola Harman, MD  donepezil (ARICEPT) 5 MG tablet Take 5 mg by mouth at bedtime.    [provider]  ferrous sulfate 325 (65 FE) MG tablet TAKE 1 TABLET (325 MG TOTAL) BY MOUTH DAILY WITH BREAKFAST. WITH ORANGE JUICE 08/19/22   Jaci Standard, MD  folic acid (FOLVITE) 800 MCG tablet Take 800 mcg by mouth daily.    [provider]  furosemide (LASIX) 20 MG tablet Take 20 mg by mouth daily.     [provider]  levothyroxine (SYNTHROID) 100 MCG tablet Take 100 mcg by mouth daily. 07/31/21   [provider]  linaclotide Karlene Einstein) 145 MCG CAPS capsule Take 1 capsule (145 mcg total) by mouth daily before breakfast. 07/08/22   Imogene Burn, MD  Magnesium Oxide 420 (252 Mg) MG TABS Take 420-840 mg by mouth as needed (for cramps).    [provider]  montelukast (SINGULAIR) 10 MG tablet Take 10 mg by mouth every evening.    [provider]  Multiple Vitamins-Minerals (HAIR/SKIN/NAILS) CAPS Take 5,000 mcg by mouth daily.    [provider]  Na Sulfate-K Sulfate-Mg Sulf (SUPREP BOWEL PREP KIT) 17.5-3.13-1.6 GM/177ML SOLN Take 1 kit by mouth as directed. 11/08/21   Imogene Burn, MD  nitroGLYCERIN (NITROSTAT) 0.4 MG SL tablet Place 0.4 mg under the tongue every 5 (five) minutes as needed for chest pain.    [provider]  octreotide (SANDOSTATIN) 50 MCG/ML SOLN injection Inject 1 mL (50 mcg total) into the skin 2 (two) times daily. 01/09/22   Imogene Burn, MD  Omega-3 Fatty Acids (FISH OIL BURP-LESS) 1200 MG CAPS Take 1,000 mg by mouth every evening.    [provider]  pantoprazole (PROTONIX) 40 MG tablet  Take 1 tablet (40 mg total) by mouth daily before breakfast. 07/08/22   Imogene Burn, MD  vitamin B-12 (CYANOCOBALAMIN) 1000 MCG tablet Take 1,000 mcg by mouth daily.    [provider]    Physical Exam: Vitals:   11/05/22 1703 11/05/22 1727  BP: (!) 130/58   Pulse: 85   Resp: 16   Temp: 98.5 F (36.9 C)   TempSrc: Oral   SpO2: 100%   Weight:  82.4 kg  Height:  5\' 7"  (1.702 m)   Wt Readings from  Last 3 Encounters:  11/05/22 82.4 kg  10/30/22 81.3 kg  07/08/22 83 kg   Body mass index is 28.44 kg/m.  General exam: Pleasant, elderly African-American female.  Not in distress Skin: No rashes, lesions or ulcers. HEENT: Atraumatic, normocephalic, no obvious bleeding Lungs: Clear to auscultation bilaterally CVS: Regular rate and rhythm, no murmur GI/Abd soft, nontender, nondistended, bowel sound present CNS: Alert, awake, x 3 Psychiatry: Mood appropriate Extremities: Trace bilateral pedal edema   ------------------------------------------------------------------------------------------------------ Assessment/Plan: Principal Problem:   Acute GI bleeding Active Problems:   Angiodysplasia of intestine with hemorrhage   AAA (abdominal aortic aneurysm)   Acute on chronic blood loss anemia   Chronic kidney disease, stage 3a (HCC)  Symptomatic anemia Chronic anemia due to GI AVM as well as sickle cell trait Chronic iron deficiency Patient follows up with hematologist Dr. Leonides Schanz and periodically receives blood transfusion as well as IV iron infusion. Lately, patient has seen dark stool and reports progressively worsening symptoms of anemia. Hemoglobin trend as below showing the lowest of 6.3. Received IV Feraheme at the cancer center Ordered for 2 units of PRBC transfusion tonight.  Reports she had tolerated transfusions in the past without reaction Continue to monitor hemoglobin Given history of GI AVM, GI workup may be needed as well It seems GI Dr. Chales Abrahams has been  made aware prior to transfer, worth checking tomorrow.  I have attached him to the care team. I will order for clear liquid diet for tonight and n.p.o. after midnight just in case EGD possible tomorrow Continue Protonix, iron-B12 and folic acid Recent Labs    11/12/21 1050 12/16/21 1033 12/16/21 1034 01/13/22 1024 07/08/22 1132 10/30/22 1355 10/30/22 1356 11/05/22 1436  HGB 7.9* 9.9*  --  9.2* 10.5*  --  7.1* 6.3*  MCV 85.9 81.7  --  79.7* 77.3*  --  79.3* 80.7  FERRITIN 63  --  125 61 55.6  --  19  --   TIBC 364 302  --  330 389.2  --  420  --   IRON 155 51  --  41 58  --  197*  --   RETICCTPCT 2.3 1.2  --  1.3  --  2.5  --   --    AAA s/p EVG HLD Chronically on aspirin, Plavix and statin. Okay to continue statin.  I will keep aspirin Plavix on hold while undergoing anemia workup  Hypokalemia Potassium low at 3.2 earlier.  Replacement given.  Recheck Recent Labs  Lab 10/30/22 1356 11/05/22 1436  K 3.8 3.2*   CKD 3a Creatinine seems to be at baseline.  Continue to monitor Recent Labs    11/12/21 1050 12/16/21 1033 01/13/22 1024 10/30/22 1356 11/05/22 1436  BUN 16 12 13  27* 22  CREATININE 1.00 1.06* 0.96 1.39* 1.21*   Type 2 diabetes mellitus A1c 4.7 2022 PTA not on meds  Essential hypertension PTA on Lasix 20 mg daily.  Currently stable.  Euvolemic.  I will hold Lasix for now.  Mobility: Encourage ambulation  Goals of care   Code Status: Full Code    DVT prophylaxis:  SCDs Start: 11/05/22 1720   Antimicrobials: None Fluid: PRBC transfusion Consultants: GI Family Communication: None at bedside  Dispo: The patient is from: Home              Anticipated d/c is to: Home hopefully in 2 to 3 days  Diet: Diet Order             Diet  NPO time specified  Diet effective midnight           Diet clear liquid Room service appropriate? Yes; Fluid consistency: Thin  Diet effective now                    ------------------------------------------------------------------------------------- Severity of Illness: The appropriate patient status for this patient is INPATIENT. Inpatient status is judged to be reasonable and necessary in order to provide the required intensity of service to ensure the patient's safety. The patient's presenting symptoms, physical exam findings, and initial radiographic and laboratory data in the context of their chronic comorbidities is felt to place them at high risk for further clinical deterioration. Furthermore, it is not anticipated that the patient will be medically stable for discharge from the hospital within 2 midnights of admission.   * I certify that at the point of admission it is my clinical judgment that the patient will require inpatient hospital care spanning beyond 2 midnights from the point of admission due to high intensity of service, high risk for further deterioration and high frequency of surveillance required.* -------------------------------------------------------------------------------------  Labs on Admission:   CBC: Recent Labs  Lab 10/30/22 1356 11/05/22 1436  WBC 7.4 5.7  NEUTROABS 5.5 4.1  HGB 7.1* 6.3*  HCT 23.0* 21.3*  MCV 79.3* 80.7  PLT 330 353    Basic Metabolic Panel: Recent Labs  Lab 10/30/22 1356 11/05/22 1436  NA 144 145  K 3.8 3.2*  CL 105 110  CO2 31 27  GLUCOSE 138* 120*  BUN 27* 22  CREATININE 1.39* 1.21*  CALCIUM 9.4 9.0    Liver Function Tests: Recent Labs  Lab 10/30/22 1356 11/05/22 1436  AST 14* 12*  ALT 6 6  ALKPHOS 113 109  BILITOT 0.5 0.5  PROT 7.1 6.7  ALBUMIN 4.0 3.8   No results for input(s): "LIPASE", "AMYLASE" in the last 168 hours. No results for input(s): "AMMONIA" in the last 168 hours.  Cardiac Enzymes: No results for input(s): "CKTOTAL", "CKMB", "CKMBINDEX", "TROPONINI" in the last 168 hours.  BNP (last 3 results) No results for input(s): "BNP" in the last 8760  hours.  ProBNP (last 3 results) No results for input(s): "PROBNP" in the last 8760 hours.  CBG: No results for input(s): "GLUCAP" in the last 168 hours.  Lipase     Component Value Date/Time   LIPASE 38 01/31/2021 1137     Urinalysis    Component Value Date/Time   COLORURINE YELLOW 03/27/2009 1801   APPEARANCEUR CLOUDY (A) 03/27/2009 1801   LABSPEC 1.003 (L) 03/27/2009 1801   PHURINE 6.5 03/27/2009 1801   GLUCOSEU NEGATIVE 03/27/2009 1801   HGBUR NEGATIVE 03/27/2009 1801   BILIRUBINUR NEGATIVE 03/27/2009 1801   KETONESUR NEGATIVE 03/27/2009 1801   PROTEINUR NEGATIVE 03/27/2009 1801   UROBILINOGEN 0.2 03/27/2009 1801   NITRITE NEGATIVE 03/27/2009 1801   LEUKOCYTESUR  03/27/2009 1801    NEGATIVE MICROSCOPIC NOT DONE ON URINES WITH NEGATIVE PROTEIN, BLOOD, LEUKOCYTES, NITRITE, OR GLUCOSE <1000 mg/dL.     Drugs of Abuse  No results found for: "LABOPIA", "COCAINSCRNUR", "LABBENZ", "AMPHETMU", "THCU", "LABBARB"    Radiological Exams on Admission: No results found.   Signed, Lorin Glass, MD [facility] 11/05/2022

## 2022-11-05 NOTE — Progress Notes (Signed)
Pt observed for 30 minutes post Feraheme infusion. Pt tolerated trtmt well w/out incident. VSS at discharge.  Beth RN assisted Pt via W/C to inpatient d/t drop in hemoglobin.  This RN check Pt's PIV; blood return was noted and PIV was saline locked. This RN reinforced dressing and covered site with coban for transport to inpatient.

## 2022-11-05 NOTE — Patient Instructions (Signed)

## 2022-11-05 NOTE — Progress Notes (Signed)
Pt reported to infusion with c/o worsened SOB, fatigue and dark stools. Pt's Hgb 6.3 g/dL today. This RN made Dr. Leonides Schanz in hem/onc aware of Pt's complaints. Per Waynetta Sandy RN, Dr. Leonides Schanz to direct admit Pt to [facility] for observation. Pt and family made aware and verbalized understanding. Pt agreeable to be admitted. This RN received V/O from Dr. Leonides Schanz to leave Pt's established PIV intact. Pt was agreeable and expressed gratitiude over not having to be stuck again for an IV.

## 2022-11-06 DIAGNOSIS — K922 Gastrointestinal hemorrhage, unspecified: Secondary | ICD-10-CM | POA: Diagnosis not present

## 2022-11-06 DIAGNOSIS — D509 Iron deficiency anemia, unspecified: Secondary | ICD-10-CM

## 2022-11-06 DIAGNOSIS — K6389 Other specified diseases of intestine: Secondary | ICD-10-CM | POA: Diagnosis not present

## 2022-11-06 DIAGNOSIS — D62 Acute posthemorrhagic anemia: Secondary | ICD-10-CM

## 2022-11-06 LAB — CBC
HCT: 22.4 % — ABNORMAL LOW (ref 36.0–46.0)
HCT: 25.3 % — ABNORMAL LOW (ref 36.0–46.0)
Hemoglobin: 6.6 g/dL — CL (ref 12.0–15.0)
Hemoglobin: 7.8 g/dL — ABNORMAL LOW (ref 12.0–15.0)
MCH: 24.8 pg — ABNORMAL LOW (ref 26.0–34.0)
MCH: 26.1 pg (ref 26.0–34.0)
MCHC: 29.5 g/dL — ABNORMAL LOW (ref 30.0–36.0)
MCHC: 30.8 g/dL (ref 30.0–36.0)
MCV: 84.2 fL (ref 80.0–100.0)
MCV: 84.6 fL (ref 80.0–100.0)
Platelets: 298 10*3/uL (ref 150–400)
Platelets: 293 10*3/uL (ref 150–400)
RBC: 2.66 MIL/uL — ABNORMAL LOW (ref 3.87–5.11)
RBC: 2.99 MIL/uL — ABNORMAL LOW (ref 3.87–5.11)
RDW: 15.9 % — ABNORMAL HIGH (ref 11.5–15.5)
RDW: 15.4 % (ref 11.5–15.5)
WBC: 5.4 10*3/uL (ref 4.0–10.5)
WBC: 5 10*3/uL (ref 4.0–10.5)
nRBC: 0 % (ref 0.0–0.2)
nRBC: 0 % (ref 0.0–0.2)

## 2022-11-06 LAB — BASIC METABOLIC PANEL
Anion gap: 8 (ref 5–15)
BUN: 16 mg/dL (ref 8–23)
CO2: 23 mmol/L (ref 22–32)
Calcium: 8.4 mg/dL — ABNORMAL LOW (ref 8.9–10.3)
Chloride: 110 mmol/L (ref 98–111)
Creatinine, Ser: 1.17 mg/dL — ABNORMAL HIGH (ref 0.44–1.00)
GFR, Estimated: 50 mL/min — ABNORMAL LOW (ref >60)
Glucose, Bld: 99 mg/dL (ref 70–99)
Potassium: 3.6 mmol/L (ref 3.5–5.1)
Sodium: 141 mmol/L (ref 135–145)

## 2022-11-06 LAB — BPAM RBC
Blood Product Expiration Date: 202406242359
Unit Type and Rh: 6200
Unit Type and Rh: 9500

## 2022-11-06 LAB — TYPE AND SCREEN
ABO/RH(D): A POS
Donor AG Type: NEGATIVE

## 2022-11-06 NOTE — Progress Notes (Addendum)
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  She was directly admitted to the hospital for blood transfusion.  Subjective:  Patient has no complaints today.  She does admit to dark stools. Assessment and Plan: Principal Problem:   Acute on chronic blood loss anemia -Hemoglobin was 7.1 on 5/30 and then 6.3 on 6/5 -She follows as outpatient with hematology for iron transfusions and also takes 3 times daily oral iron-last ferritin level was 19, iron 197 and iron saturation 47 when checked on 5/30 -Feraheme given on 11/05/2022 patient - Hematology referred her back to GI due to persistently low hemoglobins and an enteroscopy was planned for later this month on the 19th - Has been transfused 2 units of packed red blood cells overnight and hemoglobin has only improved to 6.6 when checked early this morning - Will recheck hemoglobin now and if it remains low, will order further transfusions -Appreciate GI evaluation-she is scheduled for an enteroscopy tomorrow - Addendum: Hgb 7.8 - follow  Active Problems:    Angiodysplasia of intestine  -Previously noted in the duodenum and jejunum -Treated with APC   History of esophagitis and gastritis - Also noted on prior enteroscopy - Continue daily Protonix   AAA (abdominal aortic aneurysm) and left femoral stent -Is on Plavix for this as outpatient-currently on hold    Chronic kidney disease, stage 3a (HCC) -Creatinine 1.17 with a GFR of about 50 -follow      Code Status: Full Code Consultants: GI Level of Care: Level of care:  Med-Surg Total time on patient care: 35 minutes DVT prophylaxis:  SCDs Start: 11/05/22 1720     Objective:   Vitals:   11/06/22 0449 11/06/22 0733 11/06/22 1117 11/06/22 1148  BP: (!) 124/52 (!) 118/56 (!) 130/55 (!) 117/58  Pulse: 77 72 66 68  Resp: 16 17 17 17   Temp: 98 F (36.7 C) 98.1 F (36.7 C) (!) 97.5 F (36.4 C) 97.8 F (36.6 C)  TempSrc: Oral Oral Oral Oral  SpO2: 99% 100% 100% 100%  Weight:      Height:       Filed Weights   11/05/22 1727  Weight: 82.4 kg   Exam: General exam: Appears comfortable  HEENT: oral mucosa moist Respiratory system: Clear to auscultation.  Cardiovascular system: S1 & S2 heard  Gastrointestinal system: Abdomen soft, non-tender, nondistended. Normal bowel sounds   Extremities: No cyanosis, clubbing or edema Psychiatry:  Mood & affect appropriate.      CBC: Recent Labs  Lab 10/30/22 1356 11/05/22 1436 11/06/22 0821  WBC 7.4 5.7 5.4  NEUTROABS 5.5 4.1  --   HGB 7.1* 6.3* 6.6*  HCT 23.0* 21.3* 22.4*  MCV 79.3* 80.7 84.2  PLT 330 353 298   Basic Metabolic Panel: Recent Labs  Lab 10/30/22 1356 11/05/22 1436 11/06/22 0821  NA 144 145 141  K 3.8 3.2* 3.6  CL 105 110 110  CO2 31 27 23   GLUCOSE 138* 120* 99  BUN 27* 22 16  CREATININE 1.39* 1.21* 1.17*  CALCIUM 9.4 9.0 8.4*   GFR:  Estimated Creatinine Clearance: 48 mL/min (A) (by C-G formula based on SCr of 1.17 mg/dL (H)).  Scheduled Meds:  atorvastatin  40 mg Oral QHS   cyanocobalamin  1,000 mcg Oral Daily   ferrous sulfate  325 mg Oral Q breakfast   folic acid  1 mg Oral Daily   levothyroxine  125 mcg Oral QAC breakfast   montelukast  10 mg Oral QHS   pantoprazole  40 mg Oral QAC breakfast   Continuous Infusions: Imaging and lab data was personally reviewed No results found.  LOS: 1 day   Author: Calvert Cantor  11/06/2022 1:35 PM  To contact [facility]>   Check the care team in [facility] and look for the attending/consulting [facility] provider listed  Log  into [email or web address] and use [facility]'s universal password   Go to> "[facility]"  and find provider  If you still have difficulty reaching the provider, please page the [facility] (Director on Call) for the Hospitalists listed on amion

## 2022-11-06 NOTE — Consult Note (Addendum)
Consultation  Referring Provider:  [facility]  Primary Care Physician:  Gwenyth Bender, MD Primary Gastroenterologist:  Dr. Leonides Schanz       Reason for Consultation:       LOS: 1 day          HPI:   Lacey Vega is a 72 y.o. female with past medical history significant for sickle cell trait, small bowel angioectasias, PUD, achalasia s/p POEM in 2019, asthma, chronic blood loss with iron deficiency anemia and receives IV iron, DM2, HTN, HLD, AAA s/p repair asthma, anxiety, presents for evaluation of symptomatic anemia.  Patient presented to cancer center for IV Feraheme.  Labs showed hemoglobin dropped to 6.3 and patient was direct admitted.  Patient has reported dyspnea on exertion, worsening fatigue, increased back pain, dark stools, and poor appetite.  Last seen February 2024 by Dr. Leonides Schanz in the office and at that time she was having intermittent black stools due to oral iron.  She was not able to take octreotide due to expense.  Patient states over the last few months her melena has become more progressive. Reports now every BM is dark. Reports a few Bms per week (not daily). Has had increased dyspnea on exertion. Resolves when sitting down. Denies chest pain. Reports 2 lb weight loss. On ASA though denies further NSAIDS, denies alcohol and tobacco use. Is on Plavix and last dose was 6/5 AM.   PREVIOUS GI WORKUP----------------------------------------------------------  Colonoscopy 12/2011: Tubular adenoma  Esophageal manometry 07/13/2017: Achalasia type III  EGD with POEM 10/23/2017: Abnormal motility noted in lower third of esophagus.  Spasticity of esophageal body.  Normal peristalsis not noted.  Multiple small angioectasias without bleeding at the duodenal bulb and second portion of the duodenum.  VCE 08/05/18: Small non-bleeding gastric AVM. Small nonbleeding proximal small bowel AVM. Duodenal erosion   EGD 05/17/19: - LA Grade C esophagitis with bleeding. - Normal  stomach. - A few small non-bleeding angiodysplastic lesions in the duodenum. - Mucosal changes in the duodenum.   EGD 04/24/21: - Z-line regular, 40 cm from the incisors. - Gastritis with hemorrhage. - Normal duodenal bulb, first portion of the duodenum, second portion of the duodenum and third portion of the duodenum. - No specimens collected.   VCE 07/06/21 Findings: First gastric image 00:01:29 First duodenal image 00:15:34 First cecal image 02:05:04 Blood visualized in the stomach. Duodenal angioectasia visualized at 00:15:44, 00:15:52, and 00:16:23.  Blood clot with possible active bleeding visualized at 00:21:13 and 00:21:23. Summary and Recommendations: Recommend SBE for treatment of duodenal angioectasias and evaluation of bleeding source from the stomach and proximal small bowel. Spoke with Dr. Ewing Schlein and he will plan to do this procedure tomorrow. Made NPO after MN in preparation.   SBE 07/07/21: - Normal esophagus. - A single non-bleeding angiodysplastic lesion in the stomach. Treated with argon plasma coagulation (APC). - Three non-bleeding angiodysplastic lesions in the duodenum. Treated with argon plasma coagulation (APC). - The examined portion of the jejunum was normal. - No specimens collected.   SBE 08/06/21: - LA Grade A esophagitis with bleeding. - Non-bleeding gastric ulcer with a clean ulcer base (Forrest Class III). - Multiple recently bleeding angioectasias in the duodenum. Treated with argon plasma coagulation (APC). - No specimens collected.   SBE 11/27/21: - LA Grade A esophagitis with no bleeding. - Mild gastritis. - Five non-bleeding angioectasias in the duodenum. Treated with argon plasma coagulation (APC). - Two non-bleeding angioectasias in the jejunum. Treated with argon  plasma coagulation (APC). - No specimens collected.   Colonoscopy 11/27/21: - The examined portion of the ileum was normal. - Three 3 to 4 mm polyps in the transverse colon and  in the ascending colon, removed with a cold snare. Resected and retrieved. - A single non-bleeding colonic angioectasia. Treated with argon plasma coagulation (APC). - Diverticulosis in the sigmoid colon, in the descending colon, in the transverse colon and in the ascending colon. - Non-bleeding internal hemorrhoids. - Anal wart. Path: A. TRANSVERSE COLON, POLYPECTOMY:  Benign colonic mucosa with lymphoid aggregate  B. ASCENDING COLON, POLYPECTOMY:  Tubular adenoma  Negative for high-grade dysplasia and carcinoma   ED course:    Past Medical History:  Diagnosis Date   Anemia    Anxiety    Asthma    Asthma    Bronchitis    hx   Colon polyps    hyperplastic   Diabetes (HCC)    mild, history of   DJD (degenerative joint disease), cervical    Dysphagia    history of   Dyspnea    Gallstones    GERD (gastroesophageal reflux disease)    H/O chest pain    secondary to anemia   Headache    History of arteriovenous malformation (AVM)    History of blood transfusion 10/2021   History of hiatal hernia    History of tobacco use    Hyperlipemia    Hypertension    Hypothyroidism    Iron deficiency anemia    Sickle cell anemia (HCC)    "patient is not aware of this"   Thyroid disease    TIA (transient ischemic attack)    "was told that she possible had one"   Wears dentures    Wears glasses     Surgical History:  She  has a past surgical history that includes Rotator cuff repair; Cholecystectomy; Abdominal hysterectomy; Neck surgery; Colonoscopy (12/03/2011); Esophagogastroduodenoscopy (12/03/2011); Hot hemostasis (12/03/2011); Esophagogastroduodenoscopy (egd) with propofol (N/A, 08/29/2015); Botox injection (08/29/2015); Tonsillectomy; Rotator cuff repair (Right, 2014); Anterior cervical decomp/discectomy fusion (N/A, 10/03/2016); Esophageal manometry (N/A, 07/13/2017); Givens capsule study (N/A, 08/05/2018); Esophagogastroduodenoscopy (egd) with propofol (N/A, 05/17/2019); Thrombectomy  femoral artery (Left, 01/31/2021); Abdominal aortic endovascular stent graft (Bilateral, 01/31/2021); PERIPHERAL VASCULAR INTERVENTION (03/29/2021); Esophagogastroduodenoscopy (egd) with propofol (N/A, 04/24/2021); Givens capsule study (N/A, 07/06/2021); enteroscopy (N/A, 07/07/2021); Hot hemostasis (N/A, 07/07/2021); enteroscopy (N/A, 08/06/2021); Hot hemostasis (N/A, 08/06/2021); enteroscopy (N/A, 11/27/2021); Colonoscopy (N/A, 11/27/2021); Hot hemostasis (11/27/2021); and polypectomy (11/27/2021). Family History:  Her family history includes Cancer in her maternal aunt; Diabetes in her mother; Heart attack in her brother. Social History:   reports that she quit smoking about 22 years ago. Her smoking use included cigarettes. She has never used smokeless tobacco. She reports that she does not drink alcohol and does not use drugs.  Prior to Admission medications   Medication Sig Start Date End Date Taking? Authorizing Provider  acetaminophen (TYLENOL) 500 MG tablet Take 500-1,000 mg by mouth every 6 (six) hours as needed for mild pain or headache.   Yes [provider]  albuterol (VENTOLIN HFA) 108 (90 Base) MCG/ACT inhaler Inhale 1-2 puffs into the lungs every 6 (six) hours as needed for wheezing or shortness of breath.   Yes [provider]  ascorbic acid (VITAMIN C) 500 MG tablet Take 500 mg by mouth daily.   Yes [provider]  aspirin EC 81 MG tablet Take 1 tablet (81 mg total) by mouth daily. **Start on 11/25** Patient taking differently: Take  81 mg by mouth in the morning. 04/26/21  Yes Noralee Stain, DO  atorvastatin (LIPITOR) 40 MG tablet Take 40 mg by mouth at bedtime.   Yes [provider]  Biotin 2500 MCG CHEW Chew 2,500 mcg by mouth 2 (two) times daily.   Yes [provider]  Cinnamon 500 MG TABS Take 500 mg by mouth 2 (two) times daily.   Yes [provider]  clopidogrel (PLAVIX) 75 MG tablet TAKE 1 TABLET BY MOUTH EVERY DAY 07/17/22  Yes Maeola Harman, MD  cyanocobalamin (VITAMIN B12) 1000 MCG tablet Take 1,000 mcg by mouth daily.   Yes [provider]  donepezil (ARICEPT) 5 MG tablet Take 5 mg by mouth at bedtime.   Yes [provider]  ferrous gluconate (FERGON) 324 MG tablet Take 324 mg by mouth daily with supper.   Yes [provider]  folic acid (FOLVITE) 800 MCG tablet Take 800 mcg by mouth daily.   Yes [provider]  furosemide (LASIX) 20 MG tablet Take 20 mg by mouth daily.    Yes [provider]  levothyroxine (SYNTHROID) 125 MCG tablet Take 125 mcg by mouth See admin instructions. Take 125 mcg by mouth before breakfast on Mon/Tues/Wed/Thurs/Fri/Sat (take nothing on Sundays)   Yes [provider]  linaclotide (LINZESS) 145 MCG CAPS capsule Take 1 capsule (145 mcg total) by mouth daily before breakfast. Patient taking differently: Take 145 mcg by mouth 3 (three) times a week. 07/08/22  Yes Imogene Burn, MD  montelukast (SINGULAIR) 10 MG tablet Take 10 mg by mouth every evening.   Yes [provider]  nitroGLYCERIN (NITROSTAT) 0.4 MG SL tablet Place 0.4 mg under the tongue every 5 (five) minutes as needed for chest pain.   Yes [provider]  Omega-3 Fatty Acids (FISH OIL BURP-LESS) 1200 MG CAPS Take 1,200 mg by mouth daily.   Yes [provider]  pantoprazole (PROTONIX) 40 MG tablet Take 1 tablet (40 mg total) by mouth daily before breakfast. 07/08/22  Yes Imogene Burn, MD  potassium chloride SA (KLOR-CON M) 20 MEQ tablet Take 20 mEq by mouth daily at 6 PM.   Yes [provider]  Monte Fantasia INHUB 250-50 MCG/ACT AEPB Inhale 1 puff into the lungs in the morning and at bedtime.   Yes [provider]  ferrous sulfate 325 (65 FE) MG tablet TAKE 1 TABLET (325 MG TOTAL) BY MOUTH DAILY WITH BREAKFAST. WITH ORANGE JUICE Patient not taking: Reported on 11/05/2022 08/19/22   Jaci Standard, MD  Na Sulfate-K Sulfate-Mg Sulf (SUPREP  BOWEL PREP KIT) 17.5-3.13-1.6 GM/177ML SOLN Take 1 kit by mouth as directed. Patient not taking: Reported on 11/05/2022 11/08/21   Imogene Burn, MD  octreotide (SANDOSTATIN) 50 MCG/ML SOLN injection Inject 1 mL (50 mcg total) into the skin 2 (two) times daily. Patient not taking: Reported on 11/05/2022 01/09/22   Imogene Burn, MD    Current Facility-Administered Medications  Medication Dose Route Frequency Provider Last Rate Last Admin   acetaminophen (TYLENOL) tablet 650 mg  650 mg Oral Q6H PRN Dahal, Melina Schools, MD       Or   acetaminophen (TYLENOL) suppository 650 mg  650 mg Rectal Q6H PRN Dahal, Binaya, MD       albuterol (PROVENTIL) (2.5 MG/3ML) 0.083% nebulizer solution 2.5 mg  2.5 mg Nebulization Q6H PRN Dahal, Binaya, MD       atorvastatin (LIPITOR) tablet 40 mg  40 mg Oral QHS Dahal,  Melina Schools, MD   40 mg at 11/05/22 2304   cyanocobalamin (VITAMIN B12) tablet 1,000 mcg  1,000 mcg Oral Daily Dahal, Binaya, MD       ferrous sulfate tablet 325 mg  325 mg Oral Q breakfast Dahal, Binaya, MD       folic acid (FOLVITE) tablet 1 mg  1 mg Oral Daily Dahal, Binaya, MD       hydrALAZINE (APRESOLINE) injection 10 mg  10 mg Intravenous Q6H PRN Dahal, Binaya, MD       levothyroxine (SYNTHROID) tablet 125 mcg  125 mcg Oral QAC breakfast Anthoney Harada, NP   125 mcg at 11/06/22 0618   montelukast (SINGULAIR) tablet 10 mg  10 mg Oral QHS Dahal, Melina Schools, MD   10 mg at 11/05/22 2304   pantoprazole (PROTONIX) EC tablet 40 mg  40 mg Oral QAC breakfast Dahal, Melina Schools, MD       Facility-Administered Medications Ordered in Other Encounters  Medication Dose Route Frequency Provider Last Rate Last Admin   0.9 %  sodium chloride infusion   Intravenous Continuous Charlott Rakes, MD        Allergies as of 11/05/2022 - Review Complete 11/05/2022  Allergen Reaction Noted   Oxycodone Shortness Of Breath 03/12/2012    Review of Systems  Constitutional:  Positive for malaise/fatigue and weight loss. Negative for  chills and fever.  HENT:  Negative for hearing loss and tinnitus.   Eyes:  Negative for blurred vision and double vision.  Respiratory:  Positive for shortness of breath. Negative for wheezing.   Cardiovascular:  Negative for chest pain and palpitations.  Gastrointestinal:  Positive for melena. Negative for abdominal pain, blood in stool, constipation, diarrhea, heartburn, nausea and vomiting.  Genitourinary:  Negative for dysuria and urgency.  Musculoskeletal:  Positive for back pain. Negative for myalgias and neck pain.  Skin:  Negative for itching and rash.  Neurological:  Negative for seizures and loss of consciousness.  Psychiatric/Behavioral:  Negative for depression and suicidal ideas.        Physical Exam:  Vital signs in last 24 hours: Temp:  [97.7 F (36.5 C)-98.6 F (37 C)] 98.1 F (36.7 C) (06/06 0733) Pulse Rate:  [72-110] 72 (06/06 0733) Resp:  [14-18] 17 (06/06 0733) BP: (107-130)/(51-81) 118/56 (06/06 0733) SpO2:  [96 %-100 %] 100 % (06/06 0733) Weight:  [82.4 kg] 82.4 kg (06/05 1727) Last BM Date : 11/04/22 Last BM recorded by nurses in past 5 days No data recorded  Physical Exam Constitutional:      Appearance: Normal appearance.     Comments: Daughter at bedside  HENT:     Head: Normocephalic and atraumatic.     Mouth/Throat:     Mouth: Mucous membranes are moist.     Pharynx: Oropharynx is clear.  Eyes:     General: No scleral icterus.    Extraocular Movements: Extraocular movements intact.     Comments: Conjunctival pallor   Cardiovascular:     Rate and Rhythm: Normal rate and regular rhythm.  Pulmonary:     Effort: Pulmonary effort is normal. No respiratory distress.  Abdominal:     General: Abdomen is flat. Bowel sounds are normal. There is no distension.     Palpations: Abdomen is soft. There is no mass.     Tenderness: There is no abdominal tenderness. There is no guarding or rebound.     Hernia: No hernia is present.  Musculoskeletal:         General: No swelling.  Normal range of motion.     Cervical back: Normal range of motion and neck supple.  Skin:    General: Skin is warm and dry.     Coloration: Skin is not jaundiced.  Neurological:     General: No focal deficit present.     Mental Status: She is alert and oriented to person, place, and time.  Psychiatric:        Mood and Affect: Mood normal.        Behavior: Behavior normal.        Thought Content: Thought content normal.        Judgment: Judgment normal.      LAB RESULTS: Recent Labs    11/05/22 1436  WBC 5.7  HGB 6.3*  HCT 21.3*  PLT 353   BMET Recent Labs    11/05/22 1436  NA 145  K 3.2*  CL 110  CO2 27  GLUCOSE 120*  BUN 22  CREATININE 1.21*  CALCIUM 9.0   LFT Recent Labs    11/05/22 1436  PROT 6.7  ALBUMIN 3.8  AST 12*  ALT 6  ALKPHOS 109  BILITOT 0.5   PT/INR No results for input(s): "LABPROT", "INR" in the last 72 hours.  STUDIES: No results found.    Impression    GI bleed IDA Small bowel angiectasias History of PUD and esophagitis -Hgb 6.3 -BUN 22, creatinine 1.21, GFR 48 -Iron 197, TIBC 420, saturation 47% -Ferritin 19 Symptomatic anemia with history of small bowel angioectasias.  Most recent small bowel enteroscopy (see above) 11/27/2021 findings 7 AVMs treated with APC.  Suspect recurrent bleeding from known AVMs. Complicated situation in which patient needs to be on Plavix for AAA s/p EVG, however, assume that continued Plavix use will lead to continued bleeding and need for multiple procedures for AVM in the future.  AAA s/p EVG - on ASA and Plavix - last dose Plavix 6/5 AM    Plan   Plan for small bowel enteroscopy tomorrow. I thoroughly discussed the procedures to include nature, alternatives, benefits, and risks including but not limited to bleeding, perforation, infection, anesthesia/cardiac and pulmonary complications. Patient provides understanding and gave verbal consent to proceed. Continue Protonix  40 mg IV BID. Continue daily CBC with transfusion as needed to maintain Hgb >7.   Clear liquids and NPO midnight. Hold Plavix  Thank you for your kind consultation, we will continue to follow.   Bayley Leanna Sato  11/06/2022, 8:47 AM     Attending physician's note   I have taken history, reviewed the chart and examined the patient. I performed a substantive portion of this encounter, including complete performance of at least one of the key components, in conjunction with the APP. I agree with the Advanced Practitioner's note, impression and recommendations.   72yr old with H/O [place] trait, achalasia s/p POEM 2019 (no dysphagia)  With recurrent SB bleed (d/t known AVMs) on bASA/plavix (last dose 6/5). Adm Hb 6.3 (baseline 9) s/p 1U  Extensive GI eval as above including recent VCE Feb 2024 with Px SB AVMs s/p SBE Feb 2024, June 2024 with SB AVMs s/p APC. She was scheduled for outpt SBE 6/19. Unable to get LAR-octreotide\ as outpt d/t cost/INS.  She told me that she did not have any bleeding while she was off Plavix for few months.  Plavix was restarted by vascular d/t H/O AAA s/p grafting 2022 and PAD.   Plan: -IV Protonix -SBE in AM.  I have discussed risks and  benefits -Hold Plavix for now.  Can continue bASA -If continued recurrent bleeding, may have to discuss with vascular (Dr Lenell Antu) to determine if she can be maintained on ASA alone -Trend CBC. Keep Hb around 7 -FU hematology as well as outpt   Edman Circle, MD [facility] [phone]

## 2022-11-06 NOTE — TOC Progression Note (Signed)
Transition of Care [facility]) - Progression Note    Patient Details  Name: Colena Scheele MRN: [id number] Date of Birth: [birth year]  Transition of Care [facility]) CM/SW Contact  Beckie Busing, RN Phone [phone]  11/06/2022, 12:41 PM  Clinical Narrative:    Transition of Care [facility]) - Inpatient Brief Assessment   Patient Details  Name: Zaleigh Salo MRN: [id number] Date of Birth: 12-02-1950  Transition of Care [facility]) CM/SW Contact:    Beckie Busing, RN Phone Number: 11/06/2022, 12:41 PM   Clinical Narrative: Brief assessment completed. No TOC needs noted at this time.    Transition of Care Asessment: Insurance and Status: Insurance coverage has been reviewed Patient has primary care physician: Yes Home environment has been reviewed: Home with several family members Prior level of function:: Independent Prior/Current Home Services: No current home services Social Determinants of Health Reivew: SDOH reviewed no interventions necessary Readmission risk has been reviewed: Yes Transition of care needs: no transition of care needs at this time      Barriers to Discharge: Continued Medical Work up  Expected Discharge Plan and Services                                               Social Determinants of Health (SDOH) Interventions SDOH Screenings   Food Insecurity: No Food Insecurity (11/05/2022)  Housing: Low Risk  (11/05/2022)  Transportation Needs: No Transportation Needs (11/05/2022)  Utilities: Not At Risk (11/05/2022)  Tobacco Use: Medium Risk (11/05/2022)    Readmission Risk Interventions    02/05/2021   11:53 AM  Readmission Risk Prevention Plan  Post Dischage Appt Complete  Medication Screening Complete  Transportation Screening Complete

## 2022-11-06 NOTE — Telephone Encounter (Signed)
Re-routed request to Dr B.Randie Heinz at [facility] for response. I have also contacted [facility] by phone (phone [phone]) to inquire and am told by Byrd Hesselbach that the request should be manually faxed as their doctors are not in office every day to check notes. I have now faxed manually to [phone].

## 2022-11-06 NOTE — H&P (View-Only) (Signed)
     Consultation  Referring Provider:  [facility]  Primary Care Physician:  Dean, Eric L, MD Primary Gastroenterologist:  Dr. Dorsey       Reason for Consultation:       LOS: 1 day          HPI:   Lacey Vega is a 71 y.o. female with past medical history significant for sickle cell trait, small bowel angioectasias, PUD, achalasia s/p POEM in 2019, asthma, chronic blood loss with iron deficiency anemia and receives IV iron, DM2, HTN, HLD, AAA s/p repair asthma, anxiety, presents for evaluation of symptomatic anemia.  Patient presented to cancer center for IV Feraheme.  Labs showed hemoglobin dropped to 6.3 and patient was direct admitted.  Patient has reported dyspnea on exertion, worsening fatigue, increased back pain, dark stools, and poor appetite.  Last seen February 2024 by Dr. Dorsey in the office and at that time she was having intermittent black stools due to oral iron.  She was not able to take octreotide due to expense.  Patient states over the last few months her melena has become more progressive. Reports now every BM is dark. Reports a few Bms per week (not daily). Has had increased dyspnea on exertion. Resolves when sitting down. Denies chest pain. Reports 2 lb weight loss. On ASA though denies further NSAIDS, denies alcohol and tobacco use. Is on Plavix and last dose was 6/5 AM.   PREVIOUS GI WORKUP----------------------------------------------------------  Colonoscopy 12/2011: Tubular adenoma  Esophageal manometry 07/13/2017: Achalasia type III  EGD with POEM 10/23/2017: Abnormal motility noted in lower third of esophagus.  Spasticity of esophageal body.  Normal peristalsis not noted.  Multiple small angioectasias without bleeding at the duodenal bulb and second portion of the duodenum.  VCE 08/05/18: Small non-bleeding gastric AVM. Small nonbleeding proximal small bowel AVM. Duodenal erosion   EGD 05/17/19: - LA Grade C esophagitis with bleeding. - Normal  stomach. - A few small non-bleeding angiodysplastic lesions in the duodenum. - Mucosal changes in the duodenum.   EGD 04/24/21: - Z-line regular, 40 cm from the incisors. - Gastritis with hemorrhage. - Normal duodenal bulb, first portion of the duodenum, second portion of the duodenum and third portion of the duodenum. - No specimens collected.   VCE 07/06/21 Findings: First gastric image 00:01:29 First duodenal image 00:15:34 First cecal image 02:05:04 Blood visualized in the stomach. Duodenal angioectasia visualized at 00:15:44, 00:15:52, and 00:16:23.  Blood clot with possible active bleeding visualized at 00:21:13 and 00:21:23. Summary and Recommendations: Recommend SBE for treatment of duodenal angioectasias and evaluation of bleeding source from the stomach and proximal small bowel. Spoke with Dr. Magod and he will plan to do this procedure tomorrow. Made NPO after MN in preparation.   SBE 07/07/21: - Normal esophagus. - A single non-bleeding angiodysplastic lesion in the stomach. Treated with argon plasma coagulation (APC). - Three non-bleeding angiodysplastic lesions in the duodenum. Treated with argon plasma coagulation (APC). - The examined portion of the jejunum was normal. - No specimens collected.   SBE 08/06/21: - LA Grade A esophagitis with bleeding. - Non-bleeding gastric ulcer with a clean ulcer base (Forrest Class III). - Multiple recently bleeding angioectasias in the duodenum. Treated with argon plasma coagulation (APC). - No specimens collected.   SBE 11/27/21: - LA Grade A esophagitis with no bleeding. - Mild gastritis. - Five non-bleeding angioectasias in the duodenum. Treated with argon plasma coagulation (APC). - Two non-bleeding angioectasias in the jejunum. Treated with argon   plasma coagulation (APC). - No specimens collected.   Colonoscopy 11/27/21: - The examined portion of the ileum was normal. - Three 3 to 4 mm polyps in the transverse colon and  in the ascending colon, removed with a cold snare. Resected and retrieved. - A single non-bleeding colonic angioectasia. Treated with argon plasma coagulation (APC). - Diverticulosis in the sigmoid colon, in the descending colon, in the transverse colon and in the ascending colon. - Non-bleeding internal hemorrhoids. - Anal wart. Path: A. TRANSVERSE COLON, POLYPECTOMY:  Benign colonic mucosa with lymphoid aggregate  B. ASCENDING COLON, POLYPECTOMY:  Tubular adenoma  Negative for high-grade dysplasia and carcinoma   ED course:    Past Medical History:  Diagnosis Date   Anemia    Anxiety    Asthma    Asthma    Bronchitis    hx   Colon polyps    hyperplastic   Diabetes (HCC)    mild, history of   DJD (degenerative joint disease), cervical    Dysphagia    history of   Dyspnea    Gallstones    GERD (gastroesophageal reflux disease)    H/O chest pain    secondary to anemia   Headache    History of arteriovenous malformation (AVM)    History of blood transfusion 10/2021   History of hiatal hernia    History of tobacco use    Hyperlipemia    Hypertension    Hypothyroidism    Iron deficiency anemia    Sickle cell anemia (HCC)    "patient is not aware of this"   Thyroid disease    TIA (transient ischemic attack)    "was told that she possible had one"   Wears dentures    Wears glasses     Surgical History:  She  has a past surgical history that includes Rotator cuff repair; Cholecystectomy; Abdominal hysterectomy; Neck surgery; Colonoscopy (12/03/2011); Esophagogastroduodenoscopy (12/03/2011); Hot hemostasis (12/03/2011); Esophagogastroduodenoscopy (egd) with propofol (N/A, 08/29/2015); Botox injection (08/29/2015); Tonsillectomy; Rotator cuff repair (Right, 2014); Anterior cervical decomp/discectomy fusion (N/A, 10/03/2016); Esophageal manometry (N/A, 07/13/2017); Givens capsule study (N/A, 08/05/2018); Esophagogastroduodenoscopy (egd) with propofol (N/A, 05/17/2019); Thrombectomy  femoral artery (Left, 01/31/2021); Abdominal aortic endovascular stent graft (Bilateral, 01/31/2021); PERIPHERAL VASCULAR INTERVENTION (03/29/2021); Esophagogastroduodenoscopy (egd) with propofol (N/A, 04/24/2021); Givens capsule study (N/A, 07/06/2021); enteroscopy (N/A, 07/07/2021); Hot hemostasis (N/A, 07/07/2021); enteroscopy (N/A, 08/06/2021); Hot hemostasis (N/A, 08/06/2021); enteroscopy (N/A, 11/27/2021); Colonoscopy (N/A, 11/27/2021); Hot hemostasis (11/27/2021); and polypectomy (11/27/2021). Family History:  Her family history includes Cancer in her maternal aunt; Diabetes in her mother; Heart attack in her brother. Social History:   reports that she quit smoking about 22 years ago. Her smoking use included cigarettes. She has never used smokeless tobacco. She reports that she does not drink alcohol and does not use drugs.  Prior to Admission medications   Medication Sig Start Date End Date Taking? Authorizing Provider  acetaminophen (TYLENOL) 500 MG tablet Take 500-1,000 mg by mouth every 6 (six) hours as needed for mild pain or headache.   Yes [provider]  albuterol (VENTOLIN HFA) 108 (90 Base) MCG/ACT inhaler Inhale 1-2 puffs into the lungs every 6 (six) hours as needed for wheezing or shortness of breath.   Yes [provider]  ascorbic acid (VITAMIN C) 500 MG tablet Take 500 mg by mouth daily.   Yes [provider]  aspirin EC 81 MG tablet Take 1 tablet (81 mg total) by mouth daily. **Start on 11/25** Patient taking differently: Take   81 mg by mouth in the morning. 04/26/21  Yes Choi, Jennifer, DO  atorvastatin (LIPITOR) 40 MG tablet Take 40 mg by mouth at bedtime.   Yes [provider]  Biotin 2500 MCG CHEW Chew 2,500 mcg by mouth 2 (two) times daily.   Yes [provider]  Cinnamon 500 MG TABS Take 500 mg by mouth 2 (two) times daily.   Yes [provider]  clopidogrel (PLAVIX) 75 MG tablet TAKE 1 TABLET BY MOUTH EVERY DAY 07/17/22  Yes Cain,  Brandon Christopher, MD  cyanocobalamin (VITAMIN B12) 1000 MCG tablet Take 1,000 mcg by mouth daily.   Yes [provider]  donepezil (ARICEPT) 5 MG tablet Take 5 mg by mouth at bedtime.   Yes [provider]  ferrous gluconate (FERGON) 324 MG tablet Take 324 mg by mouth daily with supper.   Yes [provider]  folic acid (FOLVITE) 800 MCG tablet Take 800 mcg by mouth daily.   Yes [provider]  furosemide (LASIX) 20 MG tablet Take 20 mg by mouth daily.    Yes [provider]  levothyroxine (SYNTHROID) 125 MCG tablet Take 125 mcg by mouth See admin instructions. Take 125 mcg by mouth before breakfast on Mon/Tues/Wed/Thurs/Fri/Sat (take nothing on Sundays)   Yes [provider]  linaclotide (LINZESS) 145 MCG CAPS capsule Take 1 capsule (145 mcg total) by mouth daily before breakfast. Patient taking differently: Take 145 mcg by mouth 3 (three) times a week. 07/08/22  Yes Dorsey, Ying C, MD  montelukast (SINGULAIR) 10 MG tablet Take 10 mg by mouth every evening.   Yes [provider]  nitroGLYCERIN (NITROSTAT) 0.4 MG SL tablet Place 0.4 mg under the tongue every 5 (five) minutes as needed for chest pain.   Yes [provider]  Omega-3 Fatty Acids (FISH OIL BURP-LESS) 1200 MG CAPS Take 1,200 mg by mouth daily.   Yes [provider]  pantoprazole (PROTONIX) 40 MG tablet Take 1 tablet (40 mg total) by mouth daily before breakfast. 07/08/22  Yes Dorsey, Ying C, MD  potassium chloride SA (KLOR-CON M) 20 MEQ tablet Take 20 mEq by mouth daily at 6 PM.   Yes [provider]  WIXELA INHUB 250-50 MCG/ACT AEPB Inhale 1 puff into the lungs in the morning and at bedtime.   Yes [provider]  ferrous sulfate 325 (65 FE) MG tablet TAKE 1 TABLET (325 MG TOTAL) BY MOUTH DAILY WITH BREAKFAST. WITH ORANGE JUICE Patient not taking: Reported on 11/05/2022 08/19/22   Dorsey, John T IV, MD  Na Sulfate-K Sulfate-Mg Sulf (SUPREP  BOWEL PREP KIT) 17.5-3.13-1.6 GM/177ML SOLN Take 1 kit by mouth as directed. Patient not taking: Reported on 11/05/2022 11/08/21   Dorsey, Ying C, MD  octreotide (SANDOSTATIN) 50 MCG/ML SOLN injection Inject 1 mL (50 mcg total) into the skin 2 (two) times daily. Patient not taking: Reported on 11/05/2022 01/09/22   Dorsey, Ying C, MD    Current Facility-Administered Medications  Medication Dose Route Frequency Provider Last Rate Last Admin   acetaminophen (TYLENOL) tablet 650 mg  650 mg Oral Q6H PRN Dahal, Binaya, MD       Or   acetaminophen (TYLENOL) suppository 650 mg  650 mg Rectal Q6H PRN Dahal, Binaya, MD       albuterol (PROVENTIL) (2.5 MG/3ML) 0.083% nebulizer solution 2.5 mg  2.5 mg Nebulization Q6H PRN Dahal, Binaya, MD       atorvastatin (LIPITOR) tablet 40 mg  40 mg Oral QHS Dahal,   Binaya, MD   40 mg at 11/05/22 2304   cyanocobalamin (VITAMIN B12) tablet 1,000 mcg  1,000 mcg Oral Daily Dahal, Binaya, MD       ferrous sulfate tablet 325 mg  325 mg Oral Q breakfast Dahal, Binaya, MD       folic acid (FOLVITE) tablet 1 mg  1 mg Oral Daily Dahal, Binaya, MD       hydrALAZINE (APRESOLINE) injection 10 mg  10 mg Intravenous Q6H PRN Dahal, Binaya, MD       levothyroxine (SYNTHROID) tablet 125 mcg  125 mcg Oral QAC breakfast Chavez, Abigail, NP   125 mcg at 11/06/22 0618   montelukast (SINGULAIR) tablet 10 mg  10 mg Oral QHS Dahal, Binaya, MD   10 mg at 11/05/22 2304   pantoprazole (PROTONIX) EC tablet 40 mg  40 mg Oral QAC breakfast Dahal, Binaya, MD       Facility-Administered Medications Ordered in Other Encounters  Medication Dose Route Frequency Provider Last Rate Last Admin   0.9 %  sodium chloride infusion   Intravenous Continuous Schooler, Vincent, MD        Allergies as of 11/05/2022 - Review Complete 11/05/2022  Allergen Reaction Noted   Oxycodone Shortness Of Breath 03/12/2012    Review of Systems  Constitutional:  Positive for malaise/fatigue and weight loss. Negative for  chills and fever.  HENT:  Negative for hearing loss and tinnitus.   Eyes:  Negative for blurred vision and double vision.  Respiratory:  Positive for shortness of breath. Negative for wheezing.   Cardiovascular:  Negative for chest pain and palpitations.  Gastrointestinal:  Positive for melena. Negative for abdominal pain, blood in stool, constipation, diarrhea, heartburn, nausea and vomiting.  Genitourinary:  Negative for dysuria and urgency.  Musculoskeletal:  Positive for back pain. Negative for myalgias and neck pain.  Skin:  Negative for itching and rash.  Neurological:  Negative for seizures and loss of consciousness.  Psychiatric/Behavioral:  Negative for depression and suicidal ideas.        Physical Exam:  Vital signs in last 24 hours: Temp:  [97.7 F (36.5 C)-98.6 F (37 C)] 98.1 F (36.7 C) (06/06 0733) Pulse Rate:  [72-110] 72 (06/06 0733) Resp:  [14-18] 17 (06/06 0733) BP: (107-130)/(51-81) 118/56 (06/06 0733) SpO2:  [96 %-100 %] 100 % (06/06 0733) Weight:  [82.4 kg] 82.4 kg (06/05 1727) Last BM Date : 11/04/22 Last BM recorded by nurses in past 5 days No data recorded  Physical Exam Constitutional:      Appearance: Normal appearance.     Comments: Daughter at bedside  HENT:     Head: Normocephalic and atraumatic.     Mouth/Throat:     Mouth: Mucous membranes are moist.     Pharynx: Oropharynx is clear.  Eyes:     General: No scleral icterus.    Extraocular Movements: Extraocular movements intact.     Comments: Conjunctival pallor   Cardiovascular:     Rate and Rhythm: Normal rate and regular rhythm.  Pulmonary:     Effort: Pulmonary effort is normal. No respiratory distress.  Abdominal:     General: Abdomen is flat. Bowel sounds are normal. There is no distension.     Palpations: Abdomen is soft. There is no mass.     Tenderness: There is no abdominal tenderness. There is no guarding or rebound.     Hernia: No hernia is present.  Musculoskeletal:         General: No swelling.   Normal range of motion.     Cervical back: Normal range of motion and neck supple.  Skin:    General: Skin is warm and dry.     Coloration: Skin is not jaundiced.  Neurological:     General: No focal deficit present.     Mental Status: She is alert and oriented to person, place, and time.  Psychiatric:        Mood and Affect: Mood normal.        Behavior: Behavior normal.        Thought Content: Thought content normal.        Judgment: Judgment normal.      LAB RESULTS: Recent Labs    11/05/22 1436  WBC 5.7  HGB 6.3*  HCT 21.3*  PLT 353   BMET Recent Labs    11/05/22 1436  NA 145  K 3.2*  CL 110  CO2 27  GLUCOSE 120*  BUN 22  CREATININE 1.21*  CALCIUM 9.0   LFT Recent Labs    11/05/22 1436  PROT 6.7  ALBUMIN 3.8  AST 12*  ALT 6  ALKPHOS 109  BILITOT 0.5   PT/INR No results for input(s): "LABPROT", "INR" in the last 72 hours.  STUDIES: No results found.    Impression    GI bleed IDA Small bowel angiectasias History of PUD and esophagitis -Hgb 6.3 -BUN 22, creatinine 1.21, GFR 48 -Iron 197, TIBC 420, saturation 47% -Ferritin 19 Symptomatic anemia with history of small bowel angioectasias.  Most recent small bowel enteroscopy (see above) 11/27/2021 findings 7 AVMs treated with APC.  Suspect recurrent bleeding from known AVMs. Complicated situation in which patient needs to be on Plavix for AAA s/p EVG, however, assume that continued Plavix use will lead to continued bleeding and need for multiple procedures for AVM in the future.  AAA s/p EVG - on ASA and Plavix - last dose Plavix 6/5 AM    Plan   Plan for small bowel enteroscopy tomorrow. I thoroughly discussed the procedures to include nature, alternatives, benefits, and risks including but not limited to bleeding, perforation, infection, anesthesia/cardiac and pulmonary complications. Patient provides understanding and gave verbal consent to proceed. Continue Protonix  40 mg IV BID. Continue daily CBC with transfusion as needed to maintain Hgb >7.   Clear liquids and NPO midnight. Hold Plavix  Thank you for your kind consultation, we will continue to follow.   Lacey Vega  11/06/2022, 8:47 AM     Attending physician's note   I have taken history, reviewed the chart and examined the patient. I performed a substantive portion of this encounter, including complete performance of at least one of the key components, in conjunction with the APP. I agree with the Advanced Practitioner's note, impression and recommendations.   71yr old with H/O [place] trait, achalasia s/p POEM 2019 (no dysphagia)  With recurrent SB bleed (d/t known AVMs) on bASA/plavix (last dose 6/5). Adm Hb 6.3 (baseline 9) s/p 1U  Extensive GI eval as above including recent VCE Feb 2024 with Px SB AVMs s/p SBE Feb 2024, June 2024 with SB AVMs s/p APC. She was scheduled for outpt SBE 6/19. Unable to get LAR-octreotide\ as outpt d/t cost/INS.  She told me that she did not have any bleeding while she was off Plavix for few months.  Plavix was restarted by vascular d/t H/O AAA s/p grafting 2022 and PAD.   Plan: -IV Protonix -SBE in AM.  I have discussed risks and   benefits -Hold Plavix for now.  Can continue bASA -If continued recurrent bleeding, may have to discuss with vascular (Dr Hawken) to determine if she can be maintained on ASA alone -Trend CBC. Keep Hb around 7 -FU hematology as well as outpt   Lacey Alexys Lobello, MD [facility] [phone]  

## 2022-11-07 ENCOUNTER — Encounter (HOSPITAL_COMMUNITY)
Admission: AD | Disposition: A | Discharge status: Home / Self Care | Payer: Self-pay | Source: Ambulatory Visit | Attending: Internal Medicine

## 2022-11-07 ENCOUNTER — Inpatient Hospital Stay (HOSPITAL_COMMUNITY): Payer: Medicare HMO | Admitting: Anesthesiology

## 2022-11-07 DIAGNOSIS — K31819 Angiodysplasia of stomach and duodenum without bleeding: Secondary | ICD-10-CM

## 2022-11-07 DIAGNOSIS — E1151 Type 2 diabetes mellitus with diabetic peripheral angiopathy without gangrene: Secondary | ICD-10-CM

## 2022-11-07 DIAGNOSIS — J449 Chronic obstructive pulmonary disease, unspecified: Secondary | ICD-10-CM

## 2022-11-07 DIAGNOSIS — E039 Hypothyroidism, unspecified: Secondary | ICD-10-CM

## 2022-11-07 DIAGNOSIS — I10 Essential (primary) hypertension: Secondary | ICD-10-CM

## 2022-11-07 DIAGNOSIS — D62 Acute posthemorrhagic anemia: Secondary | ICD-10-CM | POA: Diagnosis not present

## 2022-11-07 DIAGNOSIS — D638 Anemia in other chronic diseases classified elsewhere: Secondary | ICD-10-CM

## 2022-11-07 DIAGNOSIS — Z87891 Personal history of nicotine dependence: Secondary | ICD-10-CM

## 2022-11-07 HISTORY — PX: ENTEROSCOPY: SHX5533

## 2022-11-07 HISTORY — PX: HOT HEMOSTASIS: SHX5433

## 2022-11-07 LAB — BPAM RBC
Blood Product Expiration Date: 202407022359
ISSUE DATE / TIME: 202406060430
ISSUE DATE / TIME: 202406061118

## 2022-11-07 LAB — CBC
HCT: 24.7 % — ABNORMAL LOW (ref 36.0–46.0)
Hemoglobin: 7.8 g/dL — ABNORMAL LOW (ref 12.0–15.0)
MCH: 26.4 pg (ref 26.0–34.0)
MCHC: 31.6 g/dL (ref 30.0–36.0)
MCV: 83.4 fL (ref 80.0–100.0)
Platelets: 308 10*3/uL (ref 150–400)
RBC: 2.96 MIL/uL — ABNORMAL LOW (ref 3.87–5.11)
RDW: 15.6 % — ABNORMAL HIGH (ref 11.5–15.5)
WBC: 6.4 10*3/uL (ref 4.0–10.5)
nRBC: 0.3 % — ABNORMAL HIGH (ref 0.0–0.2)

## 2022-11-07 LAB — TYPE AND SCREEN
Antibody Screen: NEGATIVE
Donor AG Type: NEGATIVE
Unit division: 0
Unit division: 0

## 2022-11-07 SURGERY — ENTEROSCOPY
Anesthesia: Monitor Anesthesia Care

## 2022-11-07 MED ORDER — LACTATED RINGERS IV SOLN: INTRAVENOUS | Status: DC

## 2022-11-07 MED ORDER — LIDOCAINE 2% (20 MG/ML) 5 ML SYRINGE
INTRAMUSCULAR | Status: DC | PRN
  Administered 2022-11-07: 40 mg via INTRAVENOUS

## 2022-11-07 MED ORDER — PROPOFOL 500 MG/50ML IV EMUL
INTRAVENOUS | Status: AC
  Filled 2022-11-07: qty 50

## 2022-11-07 MED ORDER — PROPOFOL 10 MG/ML IV BOLUS
INTRAVENOUS | Status: DC | PRN
  Administered 2022-11-07: 20 mg via INTRAVENOUS

## 2022-11-07 MED ORDER — PROPOFOL 500 MG/50ML IV EMUL
INTRAVENOUS | Status: DC | PRN
  Administered 2022-11-07: 125 ug/kg/min via INTRAVENOUS

## 2022-11-07 MED ORDER — PHENYLEPHRINE 80 MCG/ML (10ML) SYRINGE FOR IV PUSH (FOR BLOOD PRESSURE SUPPORT)
PREFILLED_SYRINGE | INTRAVENOUS | Status: DC | PRN
  Administered 2022-11-07 (×4): 160 ug via INTRAVENOUS

## 2022-11-07 MED ORDER — SODIUM CHLORIDE 0.9 % IV SOLN: INTRAVENOUS | Status: DC

## 2022-11-07 MED ORDER — GLUCAGON HCL RDNA (DIAGNOSTIC) 1 MG IJ SOLR
INTRAMUSCULAR | Status: DC | PRN
  Administered 2022-11-07 (×3): .25 mg via INTRAVENOUS

## 2022-11-07 MED ORDER — PROPOFOL 10 MG/ML IV BOLUS
INTRAVENOUS | Status: AC
  Filled 2022-11-07: qty 20

## 2022-11-07 MED ORDER — EPHEDRINE SULFATE-NACL 50-0.9 MG/10ML-% IV SOSY
PREFILLED_SYRINGE | INTRAVENOUS | Status: DC | PRN
  Administered 2022-11-07: 5 mg via INTRAVENOUS

## 2022-11-07 NOTE — Progress Notes (Signed)
[facility] Progress Note  Patient: Lacey Vega     WUJ:[id number]  DOA: 11/05/2022   PCP: Gwenyth Bender, MD       Brief hospital course: This is a 72 year old female with history of duodenal and jejunal angioectasias status post APC, chronic blood loss with iron deficiency, sickle cell trait, diabetes mellitus, AAA status postrepair, asthma who presented to the oncologist on 6/5 for IV Feraheme and was noted to have a hemoglobin of 6.3.  She was directly admitted to the hospital for blood transfusion.  Subjective:  She has no had any BMs overnight. No complaints.  Assessment and Plan: Principal Problem:   Acute on chronic blood loss anemia -Hemoglobin was 7.1 on 5/30 and then 6.3 on 6/5 -She follows as outpatient with hematology for iron transfusions and also takes 3 times daily oral iron-last ferritin level was 19, iron 197 and iron saturation 47 when checked on 5/30 -Feraheme given on 11/05/2022 patient - Hematology referred her back to GI due to persistently low hemoglobins and an enteroscopy was planned for later this month on the 19th - Has been transfused 2 units of packed red blood cells  - Hgb 7.8 - follow -Appreciate GI evaluation  enteroscopy reveals  6 duodenal AVMs (not currently bleeding) which have been treated - recommended full liquids today and advance to solids if no bleeding   Active Problems:    H/o angiodysplasia of intestine  -Previously noted in the duodenum and jejunum and treated with APC   History of esophagitis and gastritis - Also noted on prior enteroscopy - Continue daily Protonix   AAA (abdominal aortic aneurysm) and left femoral stent -Is on Plavix for this as outpatient-currently on hold    Chronic kidney disease, stage 3a (HCC) -Creatinine 1.17 with a GFR of about 50 -follow      Code Status: Full Code Consultants: GI Level of Care: Level of care: Med-Surg Total time on patient care: 35 minutes DVT prophylaxis:  SCDs Start:  11/05/22 1720     Objective:   Vitals:   11/07/22 1518 11/07/22 1523 11/07/22 1528 11/07/22 1538  BP: (!) 97/50 (!) 99/44 96/68 (!) 106/52  Pulse: 79 79 82 71  Resp: (!) 26 (!) 22 20 18   Temp: 98.3 F (36.8 C)     TempSrc: Temporal     SpO2: 100% 96% 94% 96%  Weight:      Height:       Filed Weights   11/05/22 1727 11/07/22 1353  Weight: 82.4 kg 82.1 kg   Exam: General exam: Appears comfortable  HEENT: oral mucosa moist Respiratory system: Clear to auscultation.  Cardiovascular system: S1 & S2 heard  Gastrointestinal system: Abdomen soft, non-tender, nondistended. Normal bowel sounds   Extremities: No cyanosis, clubbing or edema Psychiatry:  Mood & affect appropriate.    CBC: Recent Labs  Lab 11/05/22 1436 11/06/22 0821 11/06/22 1453 11/07/22 0550  WBC 5.7 5.4 5.0 6.4  NEUTROABS 4.1  --   --   --   HGB 6.3* 6.6* 7.8* 7.8*  HCT 21.3* 22.4* 25.3* 24.7*  MCV 80.7 84.2 84.6 83.4  PLT 353 298 293 308    Basic Metabolic Panel: Recent Labs  Lab 11/05/22 1436 11/06/22 0821  NA 145 141  K 3.2* 3.6  CL 110 110  CO2 27 23  GLUCOSE 120* 99  BUN 22 16  CREATININE 1.21* 1.17*  CALCIUM 9.0 8.4*    GFR: Estimated Creatinine Clearance: 47.9 mL/min (A) (by C-G formula  based on SCr of 1.17 mg/dL (H)).  Scheduled Meds:  atorvastatin  40 mg Oral QHS   cyanocobalamin  1,000 mcg Oral Daily   ferrous sulfate  325 mg Oral Q breakfast   folic acid  1 mg Oral Daily   levothyroxine  125 mcg Oral QAC breakfast   montelukast  10 mg Oral QHS   pantoprazole  40 mg Oral QAC breakfast   Continuous Infusions: Imaging and lab data was personally reviewed No results found.  LOS: 2 days   Author: Calvert Cantor  11/07/2022 3:55 PM  To contact [facility]>   Check the care team in [facility] and look for the attending/consulting [facility] provider listed  Log into [email or web address] and use [facility]'s universal password   Go to> "[facility]"  and find provider  If you  still have difficulty reaching the provider, please page the [facility] (Director on Call) for the Hospitalists listed on amion

## 2022-11-07 NOTE — Interval H&P Note (Signed)
History and Physical Interval Note:  11/07/2022 2:00 PM  [facility]  has presented today for surgery, with the diagnosis of GI bleed, melena, anemia, history of small bowel AVMs.  The various methods of treatment have been discussed with the patient and family. After consideration of risks, benefits and other options for treatment, the patient has consented to  Procedure(s): ENTEROSCOPY (N/A) as a surgical intervention.  The patient's history has been reviewed, patient examined, no change in status, stable for surgery.  I have reviewed the patient's chart and labs.  Questions were answered to the patient's satisfaction.     Lynann Bologna

## 2022-11-07 NOTE — Anesthesia Preprocedure Evaluation (Addendum)
Anesthesia Evaluation  Patient identified by MRN, date of birth, ID band Patient awake    Reviewed: Allergy & Precautions, NPO status , Patient's Chart, lab work & pertinent test results  Airway Mallampati: II  TM Distance: >3 FB Neck ROM: Full    Dental  (+) Lower Dentures, Upper Dentures, Dental Advisory Given   Pulmonary asthma , COPD,  COPD inhaler, former smoker   Pulmonary exam normal        Cardiovascular hypertension, + Peripheral Vascular Disease  Normal cardiovascular exam     Neuro/Psych  Headaches  Anxiety     TIA   GI/Hepatic Neg liver ROS, hiatal hernia,GERD  Medicated and Controlled,,  Endo/Other  diabetesHypothyroidism    Renal/GU Renal disease     Musculoskeletal  (+) Arthritis ,    Abdominal   Peds  Hematology  (+) Blood dyscrasia (Plavix), anemia   Anesthesia Other Findings GI bleed, melena, anemia, history of small bowel AVMs  Reproductive/Obstetrics                              Anesthesia Physical Anesthesia Plan  ASA: 3  Anesthesia Plan: MAC   Post-op Pain Management:    Induction: Intravenous  PONV Risk Score and Plan: 2 and Propofol infusion and Treatment may vary due to age or medical condition  Airway Management Planned: Nasal Cannula  Additional Equipment:   Intra-op Plan:   Post-operative Plan:   Informed Consent: I have reviewed the patients History and Physical, chart, labs and discussed the procedure including the risks, benefits and alternatives for the proposed anesthesia with the patient or authorized representative who has indicated his/her understanding and acceptance.     Dental advisory given  Plan Discussed with: CRNA  Anesthesia Plan Comments:         Anesthesia Quick Evaluation

## 2022-11-07 NOTE — Progress Notes (Addendum)
Initial Nutrition Assessment  INTERVENTION:   Once diet advanced: -Boost Plus chocolate TID- Each supplement provides 360kcal and 14g protein.      NUTRITION DIAGNOSIS:   Increased nutrient needs related to acute illness as evidenced by estimated needs.  GOAL:   Patient will meet greater than or equal to 90% of their needs  MONITOR:   Diet advancement, Labs, Weight trends, I & O's  REASON FOR ASSESSMENT:   Malnutrition Screening Tool    ASSESSMENT:   72 y.o. female with past medical history significant for sickle cell trait, small bowel angioectasias, PUD, achalasia s/p POEM in 2019, asthma, chronic blood loss with iron deficiency anemia and receives IV iron, DM2, HTN, HLD, AAA s/p repair asthma, anxiety, presents for evaluation of symptomatic anemia.  Patient in room, daughter at bedside. Pt playing a game on her tablet. Pt reports she eats but only consumes 1 meal a day which varies at times when she eats. Pt states she likes to eat english muffins with egg, cheese and ham on them, will eat 2 of these. States this fills her up for the rest of the day. Doesn't really get hungry. She isn't concerned about her weight and has not noticed any changes other than 2 lbs at her last doctor's appointment. States UBW ~181 lbs. This is what is documented for this admission.  Pt currently NPO awaiting enteroscopy. She was told she may discharge following the procedure.  Pt's daughter reports she sometimes drinks Boost if they get it from the store.  Pt denies any issues with chewing despite not having her teeth. States that sometimes foods feel like they get stuck in her throat and she has had to have her esophagus stretched before.   Medications: Vitamin B-12, Ferrous sulfate, Folic acid, Lactated ringers  Labs reviewed.  NUTRITION - FOCUSED PHYSICAL EXAM:  Flowsheet Row Most Recent Value  Orbital Region No depletion  Upper Arm Region No depletion  Thoracic and Lumbar Region No  depletion  Buccal Region No depletion  Temple Region Mild depletion  Clavicle Bone Region No depletion  Clavicle and Acromion Bone Region No depletion  Scapular Bone Region No depletion  Dorsal Hand No depletion  Patellar Region No depletion  Anterior Thigh Region No depletion  Posterior Calf Region No depletion  Edema (RD Assessment) None  Hair Reviewed  Eyes Reviewed  Mouth Unable to assess  [masked]  Skin Reviewed       Diet Order:   Diet Order             Diet NPO time specified  Diet effective now                   EDUCATION NEEDS:   No education needs have been identified at this time  Skin:  Skin Assessment: Reviewed RN Assessment  Last BM:  6/4  Height:   Ht Readings from Last 1 Encounters:  11/07/22 5\' 7"  (1.702 m)    Weight:   Wt Readings from Last 1 Encounters:  11/07/22 82.1 kg    BMI:  Body mass index is 28.35 kg/m.  Estimated Nutritional Needs:   Kcal:  1600-1800  Protein:  75-85g  Fluid:  1.8L/day   Tilda Franco, MS, RD, LDN Inpatient Clinical Dietitian Contact information available via Amion

## 2022-11-07 NOTE — Op Note (Signed)
[facility] Patient Name: Lacey Vega Procedure Date: 11/07/2022 MRN: [id number] Attending MD: Lynann Bologna , MD, [id number] Date of Birth: 08/23/1950 CSN: [id number] Age: 72 Admit Type: Ambulatory Procedure:                Small bowel enteroscopy Indications:              Recurrent SB bleed (d/t known AVMs) on bASA/plavix                            (last dose 6/5). Adm Hb 6.3 (baseline 9) s/p 1U.                            Prev GI eval including recent VCE Feb 2024 with Px                            SB AVMs s/p SBE Feb 2024, June 2024 with SB AVMs                            s/p APC. She was scheduled for outpt SBE 6/19. Providers:                Lynann Bologna, MD, Lorenza Evangelist, RN, Salley Scarlet, Technician, Romie Minus, CRNA Referring MD:             Nicole Kindred "Eulah Pont Medicines:                Monitored Anesthesia Care Complications:            No immediate complications. Estimated Blood Loss:     Estimated blood loss: none. Procedure:                Pre-Anesthesia Assessment:                           - Prior to the procedure, a History and Physical                            was performed, and patient medications and                            allergies were reviewed. The patient's tolerance of                            previous anesthesia was also reviewed. The risks                            and benefits of the procedure and the sedation                            options and risks were discussed with the patient.                            All questions were answered, and informed consent  was obtained. Prior Anticoagulants: The patient has                            taken Plavix (clopidogrel), last dose was 3 days                            prior to procedure. ASA Grade Assessment: III - A                            patient with severe systemic disease. After                             reviewing the risks and benefits, the patient was                            deemed in satisfactory condition to undergo the                            procedure.                           After obtaining informed consent, the endoscope was                            passed under direct vision. Throughout the                            procedure, the patient's blood pressure, pulse, and                            oxygen saturations were monitored continuously. The                            PCF-H190TL ([id number]) Olympus slim colonoscope was                            introduced through the mouth and advanced to the                            proximal jejunum (upto 120 mark). The small bowel                            enteroscopy was accomplished without difficulty.                            The patient tolerated the procedure well.                           Glucagon was administered on withdrawal of the                            scope. Scope In: Scope Out: Findings:      The examined esophagus was normal with a wide open GE junction.      The entire examined  stomach was normal.      Four angiodysplastic lesions with no bleeding were found in the first       portion of the duodenum, in the second portion of the duodenum and in       the third portion of the duodenum. Coagulation for hemostasis using       argon plasma at 0.8 liters/minute and 20 watts was successful.      Two angiodysplastic lesions with no bleeding were found in the proximal       jejunum. Coagulation for hemostasis using argon plasma at 0.8       liters/minute and 20 watts was successful. Impression:               - Normal esophagus.                           - Normal stomach.                           - Four non-bleeding angiodysplastic lesions in the                            duodenum. Treated with argon plasma coagulation                            (APC).                           - Two non-bleeding  angiodysplastic lesions in the                            duodenum. Treated with argon plasma coagulation                            (APC).                           - No specimens collected. Recommendation:           - Return patient to hospital ward for ongoing care.                           - Full liquid diet today. Advance to regular in AM.                           - Resume Plavix (clopidogrel) at prior dose in 6/9                           - FU GI clinic in 4-6 weeks.                           - If continued iron deficiency anemia with                            bleeding, consider stopping Plavix if OK with                            vascular and continue  her on baby aspirin                           - Will sign off for now.                           - The findings and recommendations were discussed                            with the patient's family. Procedure Code(s):        --- Professional ---                           [id number], Small intestinal endoscopy, enteroscopy                            beyond second portion of duodenum, not including                            ileum; with control of bleeding (eg, injection,                            bipolar cautery, unipolar cautery, laser, heater                            probe, stapler, plasma coagulator) Diagnosis Code(s):        --- Professional ---                           N82.956, Angiodysplasia of stomach and duodenum                            without bleeding                           K92.2, Gastrointestinal hemorrhage, unspecified CPT copyright 2022 American Medical Association. All rights reserved. The codes documented in this report are preliminary and upon coder review may  be revised to meet current compliance requirements. Lynann Bologna, MD 11/07/2022 3:23:43 PM This report has been signed electronically. Number of Addenda: 0

## 2022-11-07 NOTE — Transfer of Care (Signed)
Immediate Anesthesia Transfer of Care Note  Patient: Lacey Vega  Procedure(s) Performed: ENTEROSCOPY HOT HEMOSTASIS (ARGON PLASMA COAGULATION/BICAP)  Patient Location: Endoscopy Unit  Anesthesia Type:MAC  Level of Consciousness: awake, oriented, and patient cooperative  Airway & Oxygen Therapy: Patient Spontanous Breathing and Patient connected to face mask oxygen  Post-op Assessment: Report given to RN and Post -op Vital signs reviewed and stable  Post vital signs: Reviewed  Last Vitals:  Vitals Value Taken Time  BP 97/50 11/07/22 1516  Temp    Pulse 79 11/07/22 1518  Resp 26 11/07/22 1518  SpO2 100 % 11/07/22 1518  Vitals shown include unvalidated device data.  Last Pain:  Vitals:   11/07/22 1353  TempSrc: Tympanic  PainSc: 0-No pain         Complications: No notable events documented.

## 2022-11-07 NOTE — Anesthesia Procedure Notes (Signed)
Procedure Name: MAC Date/Time: 11/07/2022 2:33 PM  Performed by: Lovie Chol, CRNAPre-anesthesia Checklist: Patient identified, Emergency Drugs available, Suction available and Patient being monitored Oxygen Delivery Method: Simple face mask

## 2022-11-07 NOTE — Anesthesia Postprocedure Evaluation (Signed)
Anesthesia Post Note  Patient: Lacey Vega  Procedure(s) Performed: ENTEROSCOPY HOT HEMOSTASIS (ARGON PLASMA COAGULATION/BICAP)     Patient location during evaluation: Endoscopy Anesthesia Type: MAC Level of consciousness: awake Pain management: pain level controlled Vital Signs Assessment: post-procedure vital signs reviewed and stable Respiratory status: spontaneous breathing, nonlabored ventilation and respiratory function stable Cardiovascular status: blood pressure returned to baseline and stable Postop Assessment: no apparent nausea or vomiting Anesthetic complications: no   No notable events documented.  Last Vitals:  Vitals:   11/07/22 1528 11/07/22 1538  BP: 96/68 (!) 106/52  Pulse: 82 71  Resp: 20 18  Temp:    SpO2: 94% 96%    Last Pain:  Vitals:   11/07/22 1538  TempSrc:   PainSc: 0-No pain                 Elanie Hammitt P Hazaiah [place]

## 2022-11-08 DIAGNOSIS — D62 Acute posthemorrhagic anemia: Secondary | ICD-10-CM | POA: Diagnosis not present

## 2022-11-08 LAB — CBC
HCT: 26.6 % — ABNORMAL LOW (ref 36.0–46.0)
Hemoglobin: 8.2 g/dL — ABNORMAL LOW (ref 12.0–15.0)
MCH: 26.1 pg (ref 26.0–34.0)
MCHC: 30.8 g/dL (ref 30.0–36.0)
MCV: 84.7 fL (ref 80.0–100.0)
Platelets: 326 10*3/uL (ref 150–400)
RBC: 3.14 MIL/uL — ABNORMAL LOW (ref 3.87–5.11)
RDW: 16.3 % — ABNORMAL HIGH (ref 11.5–15.5)
WBC: 7.2 10*3/uL (ref 4.0–10.5)
nRBC: 0.3 % — ABNORMAL HIGH (ref 0.0–0.2)

## 2022-11-08 MED ORDER — PHENOL 1.4 % MT LIQD: 1.0000 | OROMUCOSAL | Status: DC | PRN

## 2022-11-08 NOTE — Progress Notes (Signed)
Patient discharged to home with family, discharge instructions reviewed with patient who verbalized understanding. 

## 2022-11-08 NOTE — Discharge Summary (Addendum)
Physician Discharge Summary  Lacey Vega UJW:[id number] DOB: 09-02-1950 DOA: 11/05/2022  PCP: Gwenyth Bender, MD  Admit date: 11/05/2022 Discharge date: 11/08/2022 Discharging to: home Recommendations for Outpatient Follow-up:  Have recommended a reassessment by vascular surgery and hematology regarding the anticoagulation she is receiving can consider increasing outpt IV iron replacement (as last ferritin level was 19 in 5/30) at least until anticoagulation is addressed  Consults:  GI Procedures:  Small bowel enteroscopy   Discharge Diagnoses:   Principal Problem:   Acute on chronic blood loss anemia Active Problems:   Angiodysplasia of intestine with hemorrhage   AAA (abdominal aortic aneurysm)   Chronic kidney disease, stage 3a (HCC)   Brief hospital course: This is a 72 year old female with history of duodenal and jejunal angioectasias status post APC, chronic blood loss with iron deficiency, sickle cell trait, diabetes mellitus, AAA status postrepair, asthma who presented to the oncologist on 6/5 for IV Feraheme and was noted to have a hemoglobin of 6.3.  She was directly admitted to the hospital for blood transfusion.   Subjective:  She has no had any BMs overnight. No complaints.  Assessment and Plan: Principal Problem:   Acute on chronic blood loss anemia suspected to be due to chronic GI bleed and Iron deficiency -Hemoglobin noted to be 7.1 on 5/30 and then 6.3 on 6/5 -She follows as outpatient with hematology for iron transfusions and also takes 3 times daily oral iron-last ferritin level was 19, iron 197 and iron saturation 47 when checked on 5/30 - Feraheme given as inpt on 11/05/2022 and total of 2 U PRBC transfused - Hematology had referred her back to GI due to persistently low hemoglobins and an enteroscopy was planned for later this month on the 19th -Appreciate GI evaluation-  enteroscopy reveals  6 duodenal AVMs (not currently bleeding) which have been  treated - Hgb has remained stable subsequent to transfusions  - can consider increasing outpt IV iron replacement (as last ferritin level was 19 in 5/30) at least until anticoagulation is addressed - Plavix has been resumed upon discharge     Active Problems:     H/o angiodysplasia of intestine  -Previously noted in the duodenum and jejunum and treated with APC    History of esophagitis and gastritis - Also noted on prior enteroscopy - Continue daily Protonix for this    AAA (abdominal aortic aneurysm) and left femoral stent -Is on Plavix as outpatient     Chronic kidney disease, stage 3a (HCC) -Creatinine 1.17 with a GFR of about 50    Obesity Body mass index is 28.35 kg/m.       Discharge Instructions  Discharge Instructions     Increase activity slowly   Complete by: As directed       Allergies as of 11/08/2022       Reactions   Oxycodone Shortness Of Breath        Medication List     STOP taking these medications    octreotide 50 MCG/ML Soln injection Commonly known as: SANDOSTATIN       TAKE these medications    acetaminophen 500 MG tablet Commonly known as: TYLENOL Take 500-1,000 mg by mouth every 6 (six) hours as needed for mild pain or headache.   albuterol 108 (90 Base) MCG/ACT inhaler Commonly known as: VENTOLIN HFA Inhale 1-2 puffs into the lungs every 6 (six) hours as needed for wheezing or shortness of breath.   ascorbic acid 500 MG tablet Commonly known  as: VITAMIN C Take 500 mg by mouth daily.   aspirin EC 81 MG tablet Take 1 tablet (81 mg total) by mouth daily. **Start on 11/25** What changed:  when to take this additional instructions   atorvastatin 40 MG tablet Commonly known as: LIPITOR Take 40 mg by mouth at bedtime.   Biotin 2500 MCG Chew Chew 2,500 mcg by mouth 2 (two) times daily.   Cinnamon 500 MG Tabs Take 500 mg by mouth 2 (two) times daily.   clopidogrel 75 MG tablet Commonly known as: PLAVIX TAKE 1  TABLET BY MOUTH EVERY DAY   cyanocobalamin 1000 MCG tablet Commonly known as: VITAMIN B12 Take 1,000 mcg by mouth daily.   donepezil 5 MG tablet Commonly known as: ARICEPT Take 5 mg by mouth at bedtime.   ferrous gluconate 324 MG tablet Commonly known as: FERGON Take 324 mg by mouth daily with supper.   Fish Oil Burp-Less 1200 MG Caps Take 1,200 mg by mouth daily.   folic acid 800 MCG tablet Commonly known as: FOLVITE Take 800 mcg by mouth daily.   furosemide 20 MG tablet Commonly known as: LASIX Take 20 mg by mouth daily.   levothyroxine 125 MCG tablet Commonly known as: SYNTHROID Take 125 mcg by mouth See admin instructions. Take 125 mcg by mouth before breakfast on Mon/Tues/Wed/Thurs/Fri/Sat (take nothing on Sundays)   linaclotide 145 MCG Caps capsule Commonly known as: Linzess Take 1 capsule (145 mcg total) by mouth daily before breakfast. What changed: when to take this   montelukast 10 MG tablet Commonly known as: SINGULAIR Take 10 mg by mouth every evening.   nitroGLYCERIN 0.4 MG SL tablet Commonly known as: NITROSTAT Place 0.4 mg under the tongue every 5 (five) minutes as needed for chest pain.   pantoprazole 40 MG tablet Commonly known as: PROTONIX Take 1 tablet (40 mg total) by mouth daily before breakfast.   potassium chloride SA 20 MEQ tablet Commonly known as: KLOR-CON M Take 20 mEq by mouth daily at 6 PM.   Wixela Inhub 250-50 MCG/ACT Aepb Generic drug: fluticasone-salmeterol Inhale 1 puff into the lungs in the morning and at bedtime.            The results of significant diagnostics from this hospitalization (including imaging, microbiology, ancillary and laboratory) are listed below for reference.    No results found. Labs:   Basic Metabolic Panel: Recent Labs  Lab 11/05/22 1436 11/06/22 0821  NA 145 141  K 3.2* 3.6  CL 110 110  CO2 27 23  GLUCOSE 120* 99  BUN 22 16  CREATININE 1.21* 1.17*  CALCIUM 9.0 8.4*      CBC: Recent Labs  Lab 11/05/22 1436 11/06/22 0821 11/06/22 1453 11/07/22 0550 11/08/22 0701  WBC 5.7 5.4 5.0 6.4 7.2  NEUTROABS 4.1  --   --   --   --   HGB 6.3* 6.6* 7.8* 7.8* 8.2*  HCT 21.3* 22.4* 25.3* 24.7* 26.6*  MCV 80.7 84.2 84.6 83.4 84.7  PLT 353 298 293 308 326         SIGNED:   Calvert Cantor, MD  [facility] 11/08/2022, 3:18 PM

## 2022-11-10 ENCOUNTER — Other Ambulatory Visit: Payer: Self-pay | Admitting: *Deleted

## 2022-11-10 ENCOUNTER — Telehealth: Payer: Self-pay | Admitting: Hematology and Oncology

## 2022-11-10 DIAGNOSIS — D500 Iron deficiency anemia secondary to blood loss (chronic): Secondary | ICD-10-CM

## 2022-11-10 NOTE — Telephone Encounter (Signed)
Contacted patient to scheduled appointments. Patient is aware of appointments that are scheduled.   

## 2022-11-10 NOTE — Telephone Encounter (Signed)
Patient is calling not sure if she is supposed to still have her procedure on 6/19 given that she just had one in the hospital. Please advise

## 2022-11-10 NOTE — Telephone Encounter (Signed)
Yes, please cancel her SBE with me on 6/19. Thank you for scheduling an office follow up.

## 2022-11-10 NOTE — Telephone Encounter (Signed)
See additional 10/31/22 telephone note for further information.

## 2022-11-10 NOTE — Telephone Encounter (Signed)
Enteroscopy originally scheduled for 11/19/22 has been cancelled (spoke with [facility] central scheduling). I have spoken to patient to advise that this enteroscopy has been cancelled as she had the procedure while she was inpatient. Also advised that she has been scheduled for a follow up with Dr Leonides Schanz on 12/26/22 at 930 am. Patient verbalizes understanding.

## 2022-11-10 NOTE — Telephone Encounter (Signed)
Dr Leonides Schanz-  We originally sent a note to cardiology regarding plavix clearance for enteroscopy scheduled for 11/19/22. I see that patient was recently hospitalized and actually had enteroscopy on 11/07/22 inpatient with a note to have her follow up in the office in 4-6 weeks. Is it safe to assume that patient will no longer need the scheduled 6/19 enteroscopy? If so, glad to cancel it. I have already scheduled patient for office follow up 12/26/22 at 930 am. Please advise.Marland KitchenMarland KitchenMarland Kitchen

## 2022-11-12 ENCOUNTER — Inpatient Hospital Stay: Payer: Medicare HMO

## 2022-11-12 ENCOUNTER — Encounter (HOSPITAL_COMMUNITY): Payer: Self-pay | Admitting: Gastroenterology

## 2022-11-12 ENCOUNTER — Other Ambulatory Visit: Payer: Self-pay

## 2022-11-12 VITALS — BP 127/55 | HR 76 | Temp 98.5°F

## 2022-11-12 DIAGNOSIS — D573 Sickle-cell trait: Secondary | ICD-10-CM | POA: Diagnosis not present

## 2022-11-12 DIAGNOSIS — D500 Iron deficiency anemia secondary to blood loss (chronic): Secondary | ICD-10-CM

## 2022-11-12 LAB — CBC WITH DIFFERENTIAL (CANCER CENTER ONLY)
Abs Immature Granulocytes: 0.02 10*3/uL (ref 0.00–0.07)
Basophils Absolute: 0 10*3/uL (ref 0.0–0.1)
Basophils Relative: 1 %
Eosinophils Absolute: 0.2 10*3/uL (ref 0.0–0.5)
Eosinophils Relative: 4 %
HCT: 29.3 % — ABNORMAL LOW (ref 36.0–46.0)
Hemoglobin: 9.2 g/dL — ABNORMAL LOW (ref 12.0–15.0)
Immature Granulocytes: 0 %
Lymphocytes Relative: 11 %
Lymphs Abs: 0.6 10*3/uL — ABNORMAL LOW (ref 0.7–4.0)
MCH: 26.5 pg (ref 26.0–34.0)
MCHC: 31.4 g/dL (ref 30.0–36.0)
MCV: 84.4 fL (ref 80.0–100.0)
Monocytes Absolute: 0.4 10*3/uL (ref 0.1–1.0)
Monocytes Relative: 8 %
Neutro Abs: 4.2 10*3/uL (ref 1.7–7.7)
Neutrophils Relative %: 76 %
Platelet Count: 294 10*3/uL (ref 150–400)
RBC: 3.47 MIL/uL — ABNORMAL LOW (ref 3.87–5.11)
RDW: 16.9 % — ABNORMAL HIGH (ref 11.5–15.5)
WBC Count: 5.5 10*3/uL (ref 4.0–10.5)
nRBC: 0 % (ref 0.0–0.2)

## 2022-11-12 LAB — CMP (CANCER CENTER ONLY)
ALT: 6 U/L (ref 0–44)
AST: 12 U/L — ABNORMAL LOW (ref 15–41)
Albumin: 3.6 g/dL (ref 3.5–5.0)
Alkaline Phosphatase: 107 U/L (ref 38–126)
Anion gap: 6 (ref 5–15)
BUN: 10 mg/dL (ref 8–23)
CO2: 28 mmol/L (ref 22–32)
Calcium: 8.8 mg/dL — ABNORMAL LOW (ref 8.9–10.3)
Chloride: 110 mmol/L (ref 98–111)
Creatinine: 0.94 mg/dL (ref 0.44–1.00)
GFR, Estimated: >60 mL/min (ref >60)
Glucose, Bld: 94 mg/dL (ref 70–99)
Potassium: 3.7 mmol/L (ref 3.5–5.1)
Sodium: 144 mmol/L (ref 135–145)
Total Bilirubin: 0.9 mg/dL (ref 0.3–1.2)
Total Protein: 6.8 g/dL (ref 6.5–8.1)

## 2022-11-12 LAB — SAMPLE TO BLOOD BANK

## 2022-11-12 MED ORDER — SODIUM CHLORIDE 0.9 % IV SOLN
510.0000 mg | Freq: Once | INTRAVENOUS | Status: AC
  Administered 2022-11-12: 510 mg via INTRAVENOUS
  Filled 2022-11-12: qty 510

## 2022-11-12 MED ORDER — SODIUM CHLORIDE 0.9 % IV SOLN: Freq: Once | INTRAVENOUS | Status: AC

## 2022-11-12 NOTE — Patient Instructions (Signed)

## 2022-11-19 ENCOUNTER — Ambulatory Visit (HOSPITAL_COMMUNITY): Admission: RE | Admit: 2022-11-19 | Payer: Medicare HMO | Source: Home / Self Care | Admitting: Internal Medicine

## 2022-11-19 ENCOUNTER — Encounter (HOSPITAL_COMMUNITY): Admission: RE | Payer: Self-pay | Source: Home / Self Care

## 2022-11-19 SURGERY — ENTEROSCOPY
Anesthesia: Monitor Anesthesia Care

## 2022-11-27 NOTE — Progress Notes (Signed)
Fax received from [facility] on 11/06/22 for medical clearance/medication hold for enteroscopy to be signed by B. Randie Heinz, MD.  Provider signed on 11/18/22, form faxed back to sender on 11/18/22, verified successful, sent to scan center.

## 2022-12-10 ENCOUNTER — Ambulatory Visit (HOSPITAL_COMMUNITY)
Admission: RE | Admit: 2022-12-10 | Discharge: 2022-12-10 | Disposition: A | Discharge status: Home / Self Care | Payer: Medicare HMO | Source: Ambulatory Visit | Attending: Vascular Surgery | Admitting: Vascular Surgery

## 2022-12-10 DIAGNOSIS — Z9889 Other specified postprocedural states: Secondary | ICD-10-CM | POA: Insufficient documentation

## 2022-12-10 DIAGNOSIS — Z8679 Personal history of other diseases of the circulatory system: Secondary | ICD-10-CM | POA: Diagnosis not present

## 2022-12-15 NOTE — Progress Notes (Unsigned)
[facility] [place] PROGRESS NOTE  ASSESSMENT / PLAN: Lacey Vega is a 72 y.o. female s/p EVAR complicated by left femoral acute limb ischemia requiring thrombectomy.  She had persistent stenosis at the proximal superficial femoral artery which I treated endovascularly 06/29/20.  Sac measures 5.7 x 5.9cm without evidence of endoleak. Follow up with me in 12 months with ABI and EVAR duplex.  SUBJECTIVE: Reports chronic fatigue and poor exercise tolerance. Getting cardiology workup. Otherwise doing well overall.  Walking is much as she likes.   OBJECTIVE: BP (!) 136/59 (BP Location: Left Arm, Patient Position: Sitting, Cuff Size: Normal)   Pulse 75   Temp 97.9 F (36.6 C)   Resp 20   Ht 5\' 7"  (1.702 m)   Wt 179 lb (81.2 kg)   SpO2 100%   BMI 28.04 kg/m  No acute distress Regular rate and rhythm Unlabored breathing Soft abdomen Left calf incision healed Left DP pulse 2+     Latest Ref Rng & Units 11/12/2022    2:59 PM 11/08/2022    7:01 AM 11/07/2022    5:50 AM  CBC  WBC 4.0 - 10.5 K/uL 5.5  7.2  6.4   Hemoglobin 12.0 - 15.0 g/dL 9.2  8.2  7.8   Hematocrit 36.0 - 46.0 % 29.3  26.6  24.7   Platelets 150 - 400 K/uL 294  326  308         Latest Ref Rng & Units 11/12/2022    2:59 PM 11/06/2022    8:21 AM 11/05/2022    2:36 PM  CMP  Glucose 70 - 99 mg/dL 94  99  621   BUN 8 - 23 mg/dL 10  16  22    Creatinine 0.44 - 1.00 mg/dL 3.08  6.57  8.46   Sodium 135 - 145 mmol/L 144  141  145   Potassium 3.5 - 5.1 mmol/L 3.7  3.6  3.2   Chloride 98 - 111 mmol/L 110  110  110   CO2 22 - 32 mmol/L 28  23  27    Calcium 8.9 - 10.3 mg/dL 8.8  8.4  9.0   Total Protein 6.5 - 8.1 g/dL 6.8   6.7   Total Bilirubin 0.3 - 1.2 mg/dL 0.9   0.5   Alkaline Phos 38 - 126 U/L 107   109   AST 15 - 41 U/L 12   12   ALT 0 - 44 U/L 6   6    Abdominal Aorta: Patent endovascular aneurysm repair with no evidence of  endoleak. The largest aortic diameter remains essentially  unchanged  compared to prior exam.   Rande Brunt. Lenell Antu, MD [facility] Phone Number: [phone] 12/16/2022 12:07 PM

## 2022-12-16 ENCOUNTER — Other Ambulatory Visit (HOSPITAL_COMMUNITY): Payer: Medicare HMO

## 2022-12-16 ENCOUNTER — Ambulatory Visit: Payer: Medicare HMO | Admitting: Vascular Surgery

## 2022-12-16 ENCOUNTER — Encounter: Payer: Self-pay | Admitting: Vascular Surgery

## 2022-12-16 VITALS — BP 136/59 | HR 75 | Temp 97.9°F | Resp 20 | Ht 67.0 in | Wt 179.0 lb

## 2022-12-16 DIAGNOSIS — Z9889 Other specified postprocedural states: Secondary | ICD-10-CM

## 2022-12-16 DIAGNOSIS — I739 Peripheral vascular disease, unspecified: Secondary | ICD-10-CM

## 2022-12-16 DIAGNOSIS — Z8679 Personal history of other diseases of the circulatory system: Secondary | ICD-10-CM | POA: Diagnosis not present

## 2022-12-24 ENCOUNTER — Other Ambulatory Visit: Payer: Self-pay

## 2022-12-24 DIAGNOSIS — I714 Abdominal aortic aneurysm, without rupture, unspecified: Secondary | ICD-10-CM

## 2022-12-26 ENCOUNTER — Ambulatory Visit: Payer: Medicare HMO | Admitting: Internal Medicine

## 2023-03-30 ENCOUNTER — Telehealth: Payer: Self-pay | Admitting: Hematology and Oncology

## 2023-03-30 ENCOUNTER — Telehealth: Payer: Self-pay | Admitting: *Deleted

## 2023-03-30 ENCOUNTER — Other Ambulatory Visit: Payer: Self-pay | Admitting: *Deleted

## 2023-03-30 DIAGNOSIS — D500 Iron deficiency anemia secondary to blood loss (chronic): Secondary | ICD-10-CM

## 2023-03-30 NOTE — Telephone Encounter (Signed)
Received call from pt stating that she is increasingly fatigued, wek, light headed. She has not seen Dr. Leonides Schanz since 10/30/22 and is requestinga visit as soon as possible. Advised we will get her labs this week for sure and then likl;ey Dr. Leonides Schanz next week as he is not working a full week this week. Pt voiced understanding.  Scheduling message sent, lab orders placed.

## 2023-04-01 ENCOUNTER — Inpatient Hospital Stay (HOSPITAL_COMMUNITY)
Admission: EM | Admit: 2023-04-01 | Discharge: 2023-04-05 | DRG: 378 | Disposition: A | Discharge status: Home / Self Care | Payer: Medicare HMO | Attending: Internal Medicine | Admitting: Internal Medicine

## 2023-04-01 ENCOUNTER — Emergency Department (HOSPITAL_COMMUNITY): Payer: Medicare HMO

## 2023-04-01 ENCOUNTER — Encounter (HOSPITAL_COMMUNITY): Payer: Self-pay | Admitting: Emergency Medicine

## 2023-04-01 ENCOUNTER — Inpatient Hospital Stay: Payer: Medicare HMO

## 2023-04-01 ENCOUNTER — Other Ambulatory Visit: Payer: Self-pay

## 2023-04-01 ENCOUNTER — Inpatient Hospital Stay (HOSPITAL_COMMUNITY): Payer: Medicare HMO

## 2023-04-01 ENCOUNTER — Telehealth: Payer: Self-pay | Admitting: *Deleted

## 2023-04-01 DIAGNOSIS — Z7951 Long term (current) use of inhaled steroids: Secondary | ICD-10-CM | POA: Diagnosis not present

## 2023-04-01 DIAGNOSIS — Z87891 Personal history of nicotine dependence: Secondary | ICD-10-CM

## 2023-04-01 DIAGNOSIS — K31819 Angiodysplasia of stomach and duodenum without bleeding: Secondary | ICD-10-CM | POA: Diagnosis present

## 2023-04-01 DIAGNOSIS — E039 Hypothyroidism, unspecified: Secondary | ICD-10-CM | POA: Diagnosis present

## 2023-04-01 DIAGNOSIS — Z7902 Long term (current) use of antithrombotics/antiplatelets: Secondary | ICD-10-CM | POA: Diagnosis not present

## 2023-04-01 DIAGNOSIS — D500 Iron deficiency anemia secondary to blood loss (chronic): Secondary | ICD-10-CM | POA: Insufficient documentation

## 2023-04-01 DIAGNOSIS — K219 Gastro-esophageal reflux disease without esophagitis: Secondary | ICD-10-CM | POA: Diagnosis present

## 2023-04-01 DIAGNOSIS — K635 Polyp of colon: Secondary | ICD-10-CM | POA: Diagnosis present

## 2023-04-01 DIAGNOSIS — D62 Acute posthemorrhagic anemia: Secondary | ICD-10-CM | POA: Diagnosis present

## 2023-04-01 DIAGNOSIS — N179 Acute kidney failure, unspecified: Secondary | ICD-10-CM | POA: Diagnosis present

## 2023-04-01 DIAGNOSIS — Z885 Allergy status to narcotic agent status: Secondary | ICD-10-CM

## 2023-04-01 DIAGNOSIS — D573 Sickle-cell trait: Secondary | ICD-10-CM | POA: Insufficient documentation

## 2023-04-01 DIAGNOSIS — Z8679 Personal history of other diseases of the circulatory system: Secondary | ICD-10-CM

## 2023-04-01 DIAGNOSIS — Z833 Family history of diabetes mellitus: Secondary | ICD-10-CM | POA: Diagnosis not present

## 2023-04-01 DIAGNOSIS — Z79890 Hormone replacement therapy (postmenopausal): Secondary | ICD-10-CM

## 2023-04-01 DIAGNOSIS — J45909 Unspecified asthma, uncomplicated: Secondary | ICD-10-CM | POA: Diagnosis present

## 2023-04-01 DIAGNOSIS — K31811 Angiodysplasia of stomach and duodenum with bleeding: Principal | ICD-10-CM | POA: Diagnosis present

## 2023-04-01 DIAGNOSIS — E1151 Type 2 diabetes mellitus with diabetic peripheral angiopathy without gangrene: Secondary | ICD-10-CM | POA: Diagnosis present

## 2023-04-01 DIAGNOSIS — Z79899 Other long term (current) drug therapy: Secondary | ICD-10-CM | POA: Diagnosis not present

## 2023-04-01 DIAGNOSIS — K5730 Diverticulosis of large intestine without perforation or abscess without bleeding: Secondary | ICD-10-CM | POA: Diagnosis present

## 2023-04-01 DIAGNOSIS — Z8673 Personal history of transient ischemic attack (TIA), and cerebral infarction without residual deficits: Secondary | ICD-10-CM | POA: Diagnosis not present

## 2023-04-01 DIAGNOSIS — D571 Sickle-cell disease without crisis: Secondary | ICD-10-CM | POA: Diagnosis present

## 2023-04-01 DIAGNOSIS — J449 Chronic obstructive pulmonary disease, unspecified: Secondary | ICD-10-CM | POA: Diagnosis not present

## 2023-04-01 DIAGNOSIS — D649 Anemia, unspecified: Secondary | ICD-10-CM | POA: Diagnosis not present

## 2023-04-01 DIAGNOSIS — K921 Melena: Secondary | ICD-10-CM | POA: Diagnosis not present

## 2023-04-01 DIAGNOSIS — Z90710 Acquired absence of both cervix and uterus: Secondary | ICD-10-CM | POA: Diagnosis not present

## 2023-04-01 DIAGNOSIS — Z7982 Long term (current) use of aspirin: Secondary | ICD-10-CM | POA: Diagnosis not present

## 2023-04-01 DIAGNOSIS — Z8249 Family history of ischemic heart disease and other diseases of the circulatory system: Secondary | ICD-10-CM | POA: Diagnosis not present

## 2023-04-01 DIAGNOSIS — K648 Other hemorrhoids: Secondary | ICD-10-CM | POA: Diagnosis present

## 2023-04-01 DIAGNOSIS — K922 Gastrointestinal hemorrhage, unspecified: Secondary | ICD-10-CM | POA: Diagnosis present

## 2023-04-01 DIAGNOSIS — E785 Hyperlipidemia, unspecified: Secondary | ICD-10-CM | POA: Diagnosis present

## 2023-04-01 DIAGNOSIS — I10 Essential (primary) hypertension: Secondary | ICD-10-CM | POA: Diagnosis present

## 2023-04-01 DIAGNOSIS — G8929 Other chronic pain: Secondary | ICD-10-CM | POA: Diagnosis present

## 2023-04-01 DIAGNOSIS — I714 Abdominal aortic aneurysm, without rupture, unspecified: Secondary | ICD-10-CM | POA: Diagnosis present

## 2023-04-01 DIAGNOSIS — I739 Peripheral vascular disease, unspecified: Secondary | ICD-10-CM | POA: Diagnosis present

## 2023-04-01 LAB — CBC WITH DIFFERENTIAL/PLATELET
Abs Immature Granulocytes: 0.02 10*3/uL (ref 0.00–0.07)
Basophils Absolute: 0 10*3/uL (ref 0.0–0.1)
Basophils Relative: 1 %
Eosinophils Absolute: 0.2 10*3/uL (ref 0.0–0.5)
Eosinophils Relative: 3 %
HCT: 17.9 % — ABNORMAL LOW (ref 36.0–46.0)
Hemoglobin: 5.3 g/dL — CL (ref 12.0–15.0)
Immature Granulocytes: 0 %
Lymphocytes Relative: 19 %
Lymphs Abs: 1.1 10*3/uL (ref 0.7–4.0)
MCH: 25.4 pg — ABNORMAL LOW (ref 26.0–34.0)
MCHC: 29.6 g/dL — ABNORMAL LOW (ref 30.0–36.0)
MCV: 85.6 fL (ref 80.0–100.0)
Monocytes Absolute: 0.7 10*3/uL (ref 0.1–1.0)
Monocytes Relative: 12 %
Neutro Abs: 4 10*3/uL (ref 1.7–7.7)
Neutrophils Relative %: 65 %
Platelets: 371 10*3/uL (ref 150–400)
RBC: 2.09 MIL/uL — ABNORMAL LOW (ref 3.87–5.11)
RDW: 16.1 % — ABNORMAL HIGH (ref 11.5–15.5)
WBC: 6.1 10*3/uL (ref 4.0–10.5)
nRBC: 0 % (ref 0.0–0.2)

## 2023-04-01 LAB — CBC WITH DIFFERENTIAL (CANCER CENTER ONLY)
Abs Immature Granulocytes: 0.03 10*3/uL (ref 0.00–0.07)
Basophils Absolute: 0 10*3/uL (ref 0.0–0.1)
Basophils Relative: 1 %
Eosinophils Absolute: 0.2 10*3/uL (ref 0.0–0.5)
Eosinophils Relative: 4 %
HCT: 17.8 % — ABNORMAL LOW (ref 36.0–46.0)
Hemoglobin: 5.4 g/dL — CL (ref 12.0–15.0)
Immature Granulocytes: 1 %
Lymphocytes Relative: 20 %
Lymphs Abs: 1.2 10*3/uL (ref 0.7–4.0)
MCH: 25.6 pg — ABNORMAL LOW (ref 26.0–34.0)
MCHC: 30.3 g/dL (ref 30.0–36.0)
MCV: 84.4 fL (ref 80.0–100.0)
Monocytes Absolute: 0.7 10*3/uL (ref 0.1–1.0)
Monocytes Relative: 13 %
Neutro Abs: 3.5 10*3/uL (ref 1.7–7.7)
Neutrophils Relative %: 61 %
Platelet Count: 340 10*3/uL (ref 150–400)
RBC: 2.11 MIL/uL — ABNORMAL LOW (ref 3.87–5.11)
RDW: 15.9 % — ABNORMAL HIGH (ref 11.5–15.5)
WBC Count: 5.6 10*3/uL (ref 4.0–10.5)
nRBC: 0 % (ref 0.0–0.2)

## 2023-04-01 LAB — COMPREHENSIVE METABOLIC PANEL
ALT: 10 U/L (ref 0–44)
AST: 16 U/L (ref 15–41)
Albumin: 3.5 g/dL (ref 3.5–5.0)
Alkaline Phosphatase: 85 U/L (ref 38–126)
Anion gap: 7 (ref 5–15)
BUN: 17 mg/dL (ref 8–23)
CO2: 25 mmol/L (ref 22–32)
Calcium: 8.5 mg/dL — ABNORMAL LOW (ref 8.9–10.3)
Chloride: 111 mmol/L (ref 98–111)
Creatinine, Ser: 1.26 mg/dL — ABNORMAL HIGH (ref 0.44–1.00)
GFR, Estimated: 45 mL/min — ABNORMAL LOW (ref >60)
Glucose, Bld: 98 mg/dL (ref 70–99)
Potassium: 3.5 mmol/L (ref 3.5–5.1)
Sodium: 143 mmol/L (ref 135–145)
Total Bilirubin: 1 mg/dL (ref 0.3–1.2)
Total Protein: 6.6 g/dL (ref 6.5–8.1)

## 2023-04-01 LAB — RETIC PANEL
Immature Retic Fract: 28 % — ABNORMAL HIGH (ref 2.3–15.9)
RBC.: 2.16 MIL/uL — ABNORMAL LOW (ref 3.87–5.11)
Retic Count, Absolute: 105 10*3/uL (ref 19.0–186.0)
Retic Ct Pct: 4.9 % — ABNORMAL HIGH (ref 0.4–3.1)
Reticulocyte Hemoglobin: 18 pg — ABNORMAL LOW (ref >27.9)

## 2023-04-01 LAB — CMP (CANCER CENTER ONLY)
ALT: 6 U/L (ref 0–44)
AST: 11 U/L — ABNORMAL LOW (ref 15–41)
Albumin: 3.7 g/dL (ref 3.5–5.0)
Alkaline Phosphatase: 82 U/L (ref 38–126)
Anion gap: 6 (ref 5–15)
BUN: 18 mg/dL (ref 8–23)
CO2: 29 mmol/L (ref 22–32)
Calcium: 8.4 mg/dL — ABNORMAL LOW (ref 8.9–10.3)
Chloride: 109 mmol/L (ref 98–111)
Creatinine: 1.39 mg/dL — ABNORMAL HIGH (ref 0.44–1.00)
GFR, Estimated: 40 mL/min — ABNORMAL LOW (ref >60)
Glucose, Bld: 104 mg/dL — ABNORMAL HIGH (ref 70–99)
Potassium: 3.5 mmol/L (ref 3.5–5.1)
Sodium: 144 mmol/L (ref 135–145)
Total Bilirubin: 0.6 mg/dL (ref 0.3–1.2)
Total Protein: 6.4 g/dL — ABNORMAL LOW (ref 6.5–8.1)

## 2023-04-01 LAB — IRON AND IRON BINDING CAPACITY (CC-WL,HP ONLY)
Iron: 13 ug/dL — ABNORMAL LOW (ref 28–170)
Saturation Ratios: 3 % — ABNORMAL LOW (ref 10.4–31.8)
TIBC: 451 ug/dL — ABNORMAL HIGH (ref 250–450)
UIBC: 438 ug/dL (ref 148–442)

## 2023-04-01 LAB — SAMPLE TO BLOOD BANK

## 2023-04-01 LAB — FERRITIN: Ferritin: 15 ng/mL (ref 11–307)

## 2023-04-01 LAB — OCCULT BLOOD X 1 CARD TO LAB, STOOL: Fecal Occult Bld: POSITIVE — AB

## 2023-04-01 LAB — PREPARE RBC (CROSSMATCH)

## 2023-04-01 MED ORDER — PANTOPRAZOLE SODIUM 40 MG IV SOLR
40.0000 mg | Freq: Once | INTRAVENOUS | Status: AC
  Administered 2023-04-01: 40 mg via INTRAVENOUS
  Filled 2023-04-01: qty 10

## 2023-04-01 MED ORDER — PANTOPRAZOLE SODIUM 40 MG IV SOLR
40.0000 mg | Freq: Two times a day (BID) | INTRAVENOUS | Status: DC
  Administered 2023-04-01 – 2023-04-04 (×6): 40 mg via INTRAVENOUS
  Filled 2023-04-01 (×6): qty 10

## 2023-04-01 MED ORDER — SODIUM CHLORIDE 0.9% IV SOLUTION: Freq: Once | INTRAVENOUS | Status: AC

## 2023-04-01 NOTE — ED Notes (Signed)
Blood bank states blood will not be ready for another 45 min

## 2023-04-01 NOTE — H&P (Signed)
[facility] History and Physical   Patient: Lacey Vega XBJ:[id number] DOB: 1950/12/12 DOA: 04/01/2023 PCP: Gwenyth Bender, MD  DOS: the patient was seen and examined on 04/01/2023  Patient coming from: Home  Chief Complaint: Fatigue and tiredness ED TRIAGE note: Patient arrives ambulatory by POV stating she was sent by cancer center for blood transfusion. Patient reports chronic dark stools and states doctor is aware. Patient reports fatigue on going about 2-3 weeks. Patient playing on ipad and speaking on cell phone while RN attempting to triage.    HPI: Odean Quillen is a 72 y.o. 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  Patient presented to the hospital with complaints of back pain, fatigue and tiredness progressively worsening for last 2 to 3 weeks. Symptoms primarily worsening for last 48 hours.  She was having difficulty ambulating.  Had a near syncopal event.  Walking last night caused nausea and vomiting along with severe urge to defecate. No fever no chills. The time of my evaluation no abdominal pain.  No nausea no vomiting. No dizziness and lightheadedness.  No chest pain. She has chronic melanotic stool.  Follows up with GI. She also reports she has back pain which is progressively worsening since her last hospital stay located on her lower back area across the whole back. Pain is continuous but improves with leaning forward. She denies any tingling or numbness in her legs or loss of control of bowel or bladder. She took 1 dose of Aleve this Monday. She tells me that she is taking aspirin per her cardiologist but has never had a stress test that was positive or had any stent placed or strokes. She does have history of PAD with endovascular repair of aneurysm and has been on Plavix. Denies any alcohol  abuse.  Review of Systems: As mentioned in the history of present illness. All other systems reviewed and are negative.  Past Medical History:  Diagnosis Date   Anemia    Anxiety    Asthma    Asthma    Bronchitis    hx   Colon polyps    hyperplastic   Diabetes (HCC)    mild, history of   DJD (degenerative joint disease), cervical    Dysphagia    history of   Dyspnea    Gallstones    GERD (gastroesophageal reflux disease)    H/O chest pain    secondary to anemia   Headache    History of arteriovenous malformation (AVM)    History of blood transfusion 10/2021   History of hiatal hernia    History of tobacco use    Hyperlipemia    Hypertension    Hypothyroidism    Iron deficiency anemia    Sickle cell anemia (HCC)    "patient is not aware of this"   Thyroid disease    TIA (transient ischemic attack)    "was told that she possible had one"   Wears dentures    Wears glasses    Past Surgical History:  Procedure Laterality Date   ABDOMINAL AORTIC ENDOVASCULAR STENT GRAFT Bilateral 01/31/2021   Procedure: ENDOVASCULAR ANEURYSM REPAIR (EVAR)Bilateral Groin Cutdown, left femoral endaterectomy with bovine patch angioplasty.;  Surgeon: Leonie Douglas, MD;  Location: [facility] OR;  Service: Vascular;  Laterality: Bilateral;   ABDOMINAL HYSTERECTOMY     ANTERIOR CERVICAL DECOMP/DISCECTOMY FUSION N/A 10/03/2016   Procedure:  ANTERIOR CERVICAL DECOMPRESSION/DISCECTOMY FUSION CERVICAL THREE- CERVICAL FOUR;  Surgeon: Coletta Memos, MD;  Location: [facility] OR;  Service: Neurosurgery;  Laterality: N/A;  Right side approach   BOTOX INJECTION  08/29/2015   Procedure: BOTOX INJECTION;  Surgeon: Charlott Rakes, MD;  Location: [facility] ENDOSCOPY;  Service: Endoscopy;;   CHOLECYSTECTOMY     COLONOSCOPY  12/03/2011   Procedure: COLONOSCOPY;  Surgeon: Shirley Friar, MD;  Location: [facility] ENDOSCOPY;  Service: Endoscopy;  Laterality: N/A;   COLONOSCOPY N/A 11/27/2021   Procedure: COLONOSCOPY;  Surgeon: Imogene Burn, MD;  Location: [facility] ENDOSCOPY;  Service: Gastroenterology;  Laterality: N/A;   ENTEROSCOPY N/A 07/07/2021   Procedure: ENTEROSCOPY;  Surgeon: Vida Rigger, MD;  Location: [facility] ENDOSCOPY;  Service: Endoscopy;  Laterality: N/A;   ENTEROSCOPY N/A 08/06/2021   Procedure: ENTEROSCOPY;  Surgeon: Kerin Salen, MD;  Location: [facility] ENDOSCOPY;  Service: Gastroenterology;  Laterality: N/A;   ENTEROSCOPY N/A 11/27/2021   Procedure: SMALL  BOWEL ENTEROSCOPY;  Surgeon: Imogene Burn, MD;  Location: [facility] ENDOSCOPY;  Service: Gastroenterology;  Laterality: N/A;   ENTEROSCOPY N/A 11/07/2022   Procedure: ENTEROSCOPY;  Surgeon: Lynann Bologna, MD;  Location: [facility] ENDOSCOPY;  Service: Gastroenterology;  Laterality: N/A;   ESOPHAGEAL MANOMETRY N/A 07/13/2017   Procedure: ESOPHAGEAL MANOMETRY (EM);  Surgeon: Charlott Rakes, MD;  Location: [facility] ENDOSCOPY;  Service: Endoscopy;  Laterality: N/A;   ESOPHAGOGASTRODUODENOSCOPY  12/03/2011   Procedure: ESOPHAGOGASTRODUODENOSCOPY (EGD);  Surgeon: Shirley Friar, MD;  Location: [facility] ENDOSCOPY;  Service: Endoscopy;  Laterality: N/A;   ESOPHAGOGASTRODUODENOSCOPY (EGD) WITH PROPOFOL N/A 08/29/2015   Procedure: ESOPHAGOGASTRODUODENOSCOPY (EGD) WITH PROPOFOL;  Surgeon: Charlott Rakes, MD;  Location: [facility] ENDOSCOPY;  Service: Endoscopy;  Laterality: N/A;   ESOPHAGOGASTRODUODENOSCOPY (EGD) WITH PROPOFOL N/A 05/17/2019   Procedure: ESOPHAGOGASTRODUODENOSCOPY (EGD) WITH PROPOFOL;  Surgeon: Charlott Rakes, MD;  Location: [facility] ENDOSCOPY;  Service: Endoscopy;  Laterality: N/A;   ESOPHAGOGASTRODUODENOSCOPY (EGD) WITH PROPOFOL N/A 04/24/2021   Procedure: ESOPHAGOGASTRODUODENOSCOPY (EGD) WITH PROPOFOL;  Surgeon: Kathi Der, MD;  Location: [facility] ENDOSCOPY;  Service: Gastroenterology;  Laterality: N/A;   GIVENS CAPSULE STUDY N/A 08/05/2018   Procedure: GIVENS CAPSULE STUDY;  Surgeon: Charlott Rakes, MD;  Location: [facility] ENDOSCOPY;  Service: Endoscopy;  Laterality: N/A;   GIVENS CAPSULE STUDY N/A  07/06/2021   Procedure: GIVENS CAPSULE STUDY;  Surgeon: Vida Rigger, MD;  Location: [facility] ENDOSCOPY;  Service: Endoscopy;  Laterality: N/A;   HOT HEMOSTASIS  12/03/2011   Procedure: HOT HEMOSTASIS (ARGON PLASMA COAGULATION/BICAP);  Surgeon: Shirley Friar, MD;  Location: [facility] ENDOSCOPY;  Service: Endoscopy;  Laterality: N/A;   HOT HEMOSTASIS N/A 07/07/2021   Procedure: HOT HEMOSTASIS (ARGON PLASMA COAGULATION/BICAP);  Surgeon: Vida Rigger, MD;  Location: [facility] ENDOSCOPY;  Service: Endoscopy;  Laterality: N/A;   HOT HEMOSTASIS N/A 08/06/2021   Procedure: HOT HEMOSTASIS (ARGON PLASMA COAGULATION/BICAP);  Surgeon: Kerin Salen, MD;  Location: [facility] ENDOSCOPY;  Service: Gastroenterology;  Laterality: N/A;   HOT HEMOSTASIS  11/27/2021   Procedure: HOT HEMOSTASIS (ARGON PLASMA COAGULATION/BICAP);  Surgeon: Imogene Burn, MD;  Location: [facility] ENDOSCOPY;  Service: Gastroenterology;;  EGD and COLON   HOT HEMOSTASIS N/A 11/07/2022   Procedure: HOT HEMOSTASIS (ARGON PLASMA COAGULATION/BICAP);  Surgeon: Lynann Bologna, MD;  Location: [facility] ENDOSCOPY;  Service: Gastroenterology;  Laterality: N/A;   NECK SURGERY     PERIPHERAL VASCULAR INTERVENTION  03/29/2021   Procedure: PERIPHERAL VASCULAR INTERVENTION;  Surgeon: Leonie Douglas, MD;  Location: [facility] INVASIVE CV LAB;  Service: Cardiovascular;;  Lt SFA Brachial Approach   POLYPECTOMY  11/27/2021  Procedure: POLYPECTOMY;  Surgeon: Imogene Burn, MD;  Location: [facility] ENDOSCOPY;  Service: Gastroenterology;;   ROTATOR CUFF REPAIR     right   ROTATOR CUFF REPAIR Right 2014   THROMBECTOMY FEMORAL ARTERY Left 01/31/2021   Procedure: THROMBECTOMY POPLITEAL  ARTERY;  Surgeon: Leonie Douglas, MD;  [facility];  Service: Vascular;  Laterality: Left;   TONSILLECTOMY     Social History:  reports that she quit smoking about 22 years ago. Her smoking use included cigarettes. She has never used smokeless tobacco. She reports that she does not drink alcohol and does not use drugs.  Allergies   Allergen Reactions   Oxycodone Shortness Of Breath    Family history reviewed and not pertinent Family History  Problem Relation Age of Onset   Diabetes Mother    Heart attack Brother    Cancer Maternal Aunt     Prior to Admission medications   Medication Sig Start Date End Date Taking? Authorizing Provider  acetaminophen (TYLENOL) 500 MG tablet Take 500-1,000 mg by mouth every 6 (six) hours as needed for mild pain or headache.    [provider]  albuterol (VENTOLIN HFA) 108 (90 Base) MCG/ACT inhaler Inhale 1-2 puffs into the lungs every 6 (six) hours as needed for wheezing or shortness of breath.    [provider]  ascorbic acid (VITAMIN C) 500 MG tablet Take 500 mg by mouth daily.    [provider]  aspirin EC 81 MG tablet Take 1 tablet (81 mg total) by mouth daily. **Start on 11/25** Patient taking differently: Take 81 mg by mouth in the morning. 04/26/21   Noralee Stain, DO  atorvastatin (LIPITOR) 40 MG tablet Take 40 mg by mouth at bedtime.    [provider]  Biotin 2500 MCG CHEW Chew 2,500 mcg by mouth 2 (two) times daily.    [provider]  Cinnamon 500 MG TABS Take 500 mg by mouth 2 (two) times daily.    [provider]  clopidogrel (PLAVIX) 75 MG tablet TAKE 1 TABLET BY MOUTH EVERY DAY 07/17/22   Maeola Harman, MD  cyanocobalamin (VITAMIN B12) 1000 MCG tablet Take 1,000 mcg by mouth daily.    [provider]  donepezil (ARICEPT) 5 MG tablet Take 5 mg by mouth at bedtime.    [provider]  ferrous gluconate (FERGON) 324 MG tablet Take 324 mg by mouth daily with supper.    [provider]  folic acid (FOLVITE) 800 MCG tablet Take 800 mcg by mouth daily.    [provider]  furosemide (LASIX) 20 MG tablet Take 20 mg by mouth daily.     [provider]  levothyroxine (SYNTHROID) 125 MCG tablet Take 125 mcg by mouth See admin instructions. Take 125 mcg by mouth  before breakfast on Mon/Tues/Wed/Thurs/Fri/Sat (take nothing on Sundays)    [provider]  linaclotide (LINZESS) 145 MCG CAPS capsule Take 1 capsule (145 mcg total) by mouth daily before breakfast. Patient taking differently: Take 145 mcg by mouth 3 (three) times a week. 07/08/22   Imogene Burn, MD  montelukast (SINGULAIR) 10 MG tablet Take 10 mg by mouth every evening.    [provider]  nitroGLYCERIN (NITROSTAT) 0.4 MG SL tablet Place 0.4 mg under the tongue every 5 (five) minutes as needed for chest pain.    [provider]  Omega-3 Fatty Acids (FISH OIL BURP-LESS) 1200 MG CAPS Take 1,200 mg by mouth daily.    [provider]  pantoprazole (PROTONIX) 40 MG tablet Take 1 tablet (40 mg total) by mouth daily before breakfast. 07/08/22   Imogene Burn, MD  potassium chloride SA (KLOR-CON M) 20 MEQ tablet Take 20 mEq by mouth daily at 6 PM.    [provider]  Monte Fantasia INHUB 250-50 MCG/ACT AEPB Inhale 1 puff into the lungs in the morning and at bedtime.    [provider]    Physical Exam: Vitals:   04/01/23 1730 04/01/23 1745 04/01/23 1804 04/01/23 1819  BP: 128/65 (!) 110/59 135/61 122/64  Pulse: 71 80 70 70  Resp: 14 16 19 18   Temp:  98.4 F (36.9 C) 98.2 F (36.8 C) 98.2 F (36.8 C)  TempSrc:  Oral Oral Oral  SpO2: 100% 100% 100% 100%  Weight:      Height:        General: alert and oriented to time, place, and person. Appear in mild distress, affect appropriate Eyes: PERRL, Conjunctiva normal ENT: Oral Mucosa Clear, moist  Neck: no JVD, no Abnormal Mass Or lumps Cardiovascular: S1 and S2 Present, no Murmur, peripheral pulses symmetrical Respiratory: good respiratory effort, Bilateral Air entry equal and Decreased, no signs of accessory muscle use, Clear to Auscultation, no Crackles, no wheezes Abdomen: Bowel Sound present, Soft and no tenderness, no hernia Skin: no rashes  Extremities: bilateral trace Pedal edema, no calf  tenderness Neurologic: without any new focal findings and mental status, alert and oriented x3  Data Reviewed: I have personally reviewed and interpreted labs, imaging as discussed below.  CBC: Recent Labs  Lab 04/01/23 0925 04/01/23 1215  WBC 5.6 6.1  NEUTROABS 3.5 4.0  HGB 5.4* 5.3*  HCT 17.8* 17.9*  MCV 84.4 85.6  PLT 340 371   Basic Metabolic Panel: Recent Labs  Lab 04/01/23 0925 04/01/23 1215  NA 144 143  K 3.5 3.5  CL 109 111  CO2 29 25  GLUCOSE 104* 98  BUN 18 17  CREATININE 1.39* 1.26*  CALCIUM 8.4* 8.5*   GFR: Estimated Creatinine Clearance: 43.6 mL/min (A) (by C-G formula based on SCr of 1.26 mg/dL (H)). Liver Function Tests: Recent Labs  Lab 04/01/23 0925 04/01/23 1215  AST 11* 16  ALT 6 10  ALKPHOS 82 85  BILITOT 0.6 1.0  PROT 6.4* 6.6  ALBUMIN 3.7 3.5   No results for input(s): "LIPASE", "AMYLASE" in the last 168 hours. No results for input(s): "AMMONIA" in the last 168 hours. Coagulation Profile: No results for input(s): "INR", "PROTIME" in the last 168 hours. Cardiac Enzymes: No results for input(s): "CKTOTAL", "CKMB", "CKMBINDEX", "TROPONINI" in the last 168 hours. BNP (last 3 results) No results for input(s): "PROBNP" in the last 8760 hours. HbA1C: No results for input(s): "HGBA1C" in the last 72 hours. CBG: No results for input(s): "GLUCAP" in the last 168 hours. Lipid Profile: No results for input(s): "CHOL", "HDL", "LDLCALC", "TRIG", "CHOLHDL", "LDLDIRECT" in the last 72 hours. Thyroid Function Tests: No results for input(s): "TSH", "T4TOTAL", "FREET4", "T3FREE", "THYROIDAB" in the last 72 hours. Anemia Panel: Recent Labs    04/01/23 0924 04/01/23 0925  FERRITIN  --  15  TIBC  --  451*  IRON  --  13*  RETICCTPCT 4.9*  --    Urine analysis:    Component Value Date/Time   COLORURINE YELLOW 03/27/2009 1801   APPEARANCEUR CLOUDY (A) 03/27/2009 1801   LABSPEC 1.003 (L) 03/27/2009 1801   PHURINE 6.5 03/27/2009 1801    GLUCOSEU NEGATIVE 03/27/2009 1801  HGBUR NEGATIVE 03/27/2009 1801   BILIRUBINUR NEGATIVE 03/27/2009 1801   KETONESUR NEGATIVE 03/27/2009 1801   PROTEINUR NEGATIVE 03/27/2009 1801   UROBILINOGEN 0.2 03/27/2009 1801   NITRITE NEGATIVE 03/27/2009 1801   LEUKOCYTESUR  03/27/2009 1801    NEGATIVE MICROSCOPIC NOT DONE ON URINES WITH NEGATIVE PROTEIN, BLOOD, LEUKOCYTES, NITRITE, OR GLUCOSE <1000 mg/dL.    Radiological Exams on Admission: DG Chest Port 1 View  Result Date: 04/01/2023 CLINICAL DATA:  Shortness of breath EXAM: PORTABLE CHEST 1 VIEW COMPARISON:  X-ray 05/12/2022. CT angiogram abdomen pelvis 07/20/2022. FINDINGS: Normal cardiopericardial silhouette. No consolidation, pneumothorax or effusion. No edema. Overlapping cardiac leads. Fixation hardware along the lower cervical spine at the edge of the imaging field. There is some enlargement of the central pulmonary arteries. IMPRESSION: No acute cardiopulmonary disease. Electronically Signed   By: Karen Kays M.D.   On: 04/01/2023 15:54   EKG: Independently reviewed. normal sinus rhythm, nonspecific ST and T waves changes. I reviewed all nursing notes, pharmacy notes, vitals, pertinent old records.  Assessment/Plan GI bleed Iron deficiency anemia due to chronic blood loss Acute on chronic blood loss anemia Due to ongoing bleeding.  Potentially worsen likely due to use of Aleve. H&H significantly low hemoglobin 5.3. Requiring 2 PRBC transfusion. Monitor. GI consulted. Enteroscopy recommended on Friday. Will discuss with cardiology with regards to need for aspirin.  PAD. Patient has history of PAD with endovascular stent repair by Dr. Lenell Antu and has been on Plavix for that. She is also on 81 mg aspirin per cardiology. Need to discuss with vascular surgery and Dr. Sharyn Lull patient's cardiologist with regards to need for dual antiplatelet therapy in the setting of active bleeding.  Hypothyroidism (acquired) Continue  Synthroid  Chronic back pain. Etiology not clear. In February CT scan did not show any acute musculoskeletal abnormality. Will check lumbar spine x-ray. Straight leg raising test negative for any acute abnormality. No focal deficit at the time of my evaluation. Monitor blood  AKI (acute kidney injury) (HCC) Baseline serum creatinine 0.8. On admission serum creatinine 1.3. Receiving blood transfusion. Will monitor.  HLD (hyperlipidemia) Continue statin.  Sickle cell trait (HCC) Noted.  Nutrition: Clear liquid diet continue to concern for GI bleeding. DVT Prophylaxis: SCD, pharmacological prophylaxis contraindicated due to bleeding  Advance goals of care discussion:   Code Status: Full Code  Discussed with patient.  She does not have POA. Recommended her to discuss with family with regards to her goals of care and arrange an HCPOA. Patient would like to attempt resuscitation but would not want to be kept alive artificially for prolonged duration on "machines".  Consults: I personally Discussed with EDP.  [facility] was called by EDP.  Family Communication: No family was present at bedside, at the time of interview.   Disposition:  From: Home Likely will need Home health on discharge.   Author: Lynden Oxford, MD [facility] 04/01/2023 6:25 PM   To reach On-call, see care teams to locate the attending and reach out to them via [email or web address]. If 7PM-7AM, please contact night-coverage If you still have difficulty reaching the attending provider, please page the [facility] (Director on Call) for [facility] on amion for assistance.

## 2023-04-01 NOTE — ED Triage Notes (Signed)
Patient arrives ambulatory by POV stating she was sent by cancer center for blood transfusion. Patient reports chronic dark stools and states doctor is aware. Patient reports fatigue on going about 2-3 weeks. Patient playing on ipad and speaking on cell phone while RN attempting to triage.

## 2023-04-01 NOTE — Consult Note (Addendum)
Consultation  Primary Care Physician:  Gwenyth Bender, MD Primary Gastroenterologist:  Dr. Leonides Schanz       Reason for Consultation: anemia, melena DOA: 04/01/2023         Hospital Day: 1         HPI:   Lacey Vega is a 72 y.o. female with past medical history significant for  sickle cell trait, small bowel angioectasias, PUD, achalasia s/p POEM in 2019, asthma, chronic blood loss with iron deficiency anemia and receives IV iron, DM2, HTN, HLD, AAA (on plavix) s/p repair asthma, anxiety, presents for evaluation of symptomatic anemia (discovered during infusion at oncology center) and reported melena.   Work up in ED notable for : Creatinine 1.39/UN 18.  Hemoglobin 5.4 at infusion center, previously 9.2 on 11/12/2022.  WBCs 5.6.  Gross Melena noted on physical exam.  2 units of PRBC's ordered.  Patient was deviously admitted on 11/06/2022 for symptomatic anemia, fatigue, dark stools, and poor appetite.  Dr. 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  Treated with APC.  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    Patient was last seen by Dr. Leonides Schanz at the office in February 2024 for follow up of IDA and SB angioectasias.  and at that time she was having intermittent black stools due to taking oral iron.  Was not able to take the Sandostatin because it was too expensive.  Patient lying in bed, ports she was getting iron IV infusion at the oncology center when they discovered her hemoglobin was 5.4, previously 9.2 on 11/12/2022.  Was directly admitted. Patient reports she has had dark black stool for quite some time however as of recent her stools have been more loose with incontinence. She is taking oral iron supplementation and contributed the dark stool to that.  She also reports having shortness of breath and describes her chest as "feeling funny".  Denies chest pain.  Reports  her last dose of Plavix was this morning around 1015.  Patient does report she took a dose of ibuprofen few days ago for arthritis pain.  Patient denies nausea, vomiting, or abdominal pain.  No fever, chills, or weight loss.  No family was present at the time of my evaluation.    Previous GI workup:   VCE 08/05/18: Small non-bleeding gastric AVM. Small nonbleeding proximal small bowel AVM. Duodenal erosion   EGD 05/17/19: - LA Grade C esophagitis with bleeding. - Normal stomach. - A few small non-bleeding angiodysplastic lesions in the duodenum. - Mucosal changes in the duodenum.   EGD 04/24/21: - Z-line regular, 40 cm from the incisors. - Gastritis with hemorrhage. - Normal duodenal bulb, first portion of the duodenum, second portion of the duodenum and third portion of the duodenum. - No specimens collected.   SBE 07/06/21 Findings: First gastric image 00:01:29 First duodenal image 00:15:34 First cecal image 02:05:04 Blood visualized in the stomach. Duodenal angioectasia visualized at 00:15:44, 00:15:52, and 00:16:23.  Blood clot with possible active bleeding visualized at 00:21:13 and 00:21:23. Summary and Recommendations: Recommend SBE for treatment of duodenal angioectasias and evaluation of bleeding source from the stomach and proximal small bowel. Spoke with Dr. Ewing Schlein and he will plan to do this procedure tomorrow. Made NPO after MN in preparation.   VCE 07/07/21: - Normal esophagus. - A single non-bleeding angiodysplastic lesion in the stomach. Treated with argon plasma coagulation (APC). -  Three non-bleeding angiodysplastic lesions in the duodenum. Treated with argon plasma coagulation (APC). - The examined portion of the jejunum was normal. - No specimens collected.   SBE 08/06/21: - LA Grade A esophagitis with bleeding. - Non-bleeding gastric ulcer with a clean ulcer base (Forrest Class III). - Multiple recently bleeding angioectasias in the duodenum. Treated with  argon plasma coagulation (APC). - No specimens collected.   SBE 11/27/21: - LA Grade A esophagitis with no bleeding. - Mild gastritis. - Five non-bleeding angioectasias in the duodenum. Treated with argon plasma coagulation (APC). - Two non-bleeding angioectasias in the jejunum. Treated with argon plasma coagulation (APC). - No specimens collected.   Colonoscopy 11/27/21: - The examined portion of the ileum was normal. - Three 3 to 4 mm polyps in the transverse colon and in the ascending colon, removed with a cold snare. Resected and retrieved. - A single non-bleeding colonic angioectasia. Treated with argon plasma coagulation (APC). - Diverticulosis in the sigmoid colon, in the descending colon, in the transverse colon and in the ascending colon. - Non-bleeding internal hemorrhoids. - Anal wart. Path: A. TRANSVERSE COLON, POLYPECTOMY:  Benign colonic mucosa with lymphoid aggregate  B. ASCENDING COLON, POLYPECTOMY:  Tubular adenoma  Negative for high-grade dysplasia and carcinoma   11/07/2022 Small bowel endoscopy with Dr. 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  Treated with APC.  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      Past Medical History:  Diagnosis Date   Anemia    Anxiety    Asthma    Asthma    Bronchitis    hx   Colon polyps    hyperplastic   Diabetes (HCC)    mild, history of   DJD (degenerative joint disease), cervical    Dysphagia    history of   Dyspnea    Gallstones    GERD (gastroesophageal reflux disease)    H/O chest pain    secondary to anemia   Headache    History of arteriovenous malformation (AVM)    History of blood transfusion 10/2021   History of hiatal hernia    History of tobacco use    Hyperlipemia    Hypertension    Hypothyroidism    Iron deficiency anemia    Sickle cell anemia (HCC)    "patient is not aware of this"   Thyroid disease     TIA (transient ischemic attack)    "was told that she possible had one"   Wears dentures    Wears glasses     Surgical History:  She  has a past surgical history that includes Rotator cuff repair; Cholecystectomy; Abdominal hysterectomy; Neck surgery; Colonoscopy (12/03/2011); Esophagogastroduodenoscopy (12/03/2011); Hot hemostasis (12/03/2011); Esophagogastroduodenoscopy (egd) with propofol (N/A, 08/29/2015); Botox injection (08/29/2015); Tonsillectomy; Rotator cuff repair (Right, 2014); Anterior cervical decomp/discectomy fusion (N/A, 10/03/2016); Esophageal manometry (N/A, 07/13/2017); Givens capsule study (N/A, 08/05/2018); Esophagogastroduodenoscopy (egd) with propofol (N/A, 05/17/2019); Thrombectomy femoral artery (Left, 01/31/2021); Abdominal aortic endovascular stent graft (Bilateral, 01/31/2021); PERIPHERAL VASCULAR INTERVENTION (03/29/2021); Esophagogastroduodenoscopy (egd) with propofol (N/A, 04/24/2021); Givens capsule study (N/A, 07/06/2021); enteroscopy (N/A, 07/07/2021); Hot hemostasis (N/A, 07/07/2021); enteroscopy (N/A, 08/06/2021); Hot hemostasis (N/A, 08/06/2021); enteroscopy (N/A, 11/27/2021); Colonoscopy (N/A, 11/27/2021); Hot hemostasis (11/27/2021); polypectomy (11/27/2021); enteroscopy (N/A, 11/07/2022); and Hot hemostasis (N/A, 11/07/2022). Family History:  Her family history includes Cancer in her maternal aunt; Diabetes in her mother; Heart attack in her brother. Social History:   reports that she quit  smoking about 22 years ago. Her smoking use included cigarettes. She has never used smokeless tobacco. She reports that she does not drink alcohol and does not use drugs.  Prior to Admission medications   Medication Sig Start Date End Date Taking? Authorizing Provider  acetaminophen (TYLENOL) 500 MG tablet Take 500-1,000 mg by mouth every 6 (six) hours as needed for mild pain or headache.    [provider]  albuterol (VENTOLIN HFA) 108 (90 Base) MCG/ACT inhaler Inhale 1-2 puffs into the lungs every 6  (six) hours as needed for wheezing or shortness of breath.    [provider]  ascorbic acid (VITAMIN C) 500 MG tablet Take 500 mg by mouth daily.    [provider]  aspirin EC 81 MG tablet Take 1 tablet (81 mg total) by mouth daily. **Start on 11/25** Patient taking differently: Take 81 mg by mouth in the morning. 04/26/21   Noralee Stain, DO  atorvastatin (LIPITOR) 40 MG tablet Take 40 mg by mouth at bedtime.    [provider]  Biotin 2500 MCG CHEW Chew 2,500 mcg by mouth 2 (two) times daily.    [provider]  Cinnamon 500 MG TABS Take 500 mg by mouth 2 (two) times daily.    [provider]  clopidogrel (PLAVIX) 75 MG tablet TAKE 1 TABLET BY MOUTH EVERY DAY 07/17/22   Maeola Harman, MD  cyanocobalamin (VITAMIN B12) 1000 MCG tablet Take 1,000 mcg by mouth daily.    [provider]  donepezil (ARICEPT) 5 MG tablet Take 5 mg by mouth at bedtime.    [provider]  ferrous gluconate (FERGON) 324 MG tablet Take 324 mg by mouth daily with supper.    [provider]  folic acid (FOLVITE) 800 MCG tablet Take 800 mcg by mouth daily.    [provider]  furosemide (LASIX) 20 MG tablet Take 20 mg by mouth daily.     [provider]  levothyroxine (SYNTHROID) 125 MCG tablet Take 125 mcg by mouth See admin instructions. Take 125 mcg by mouth before breakfast on Mon/Tues/Wed/Thurs/Fri/Sat (take nothing on Sundays)    [provider]  linaclotide (LINZESS) 145 MCG CAPS capsule Take 1 capsule (145 mcg total) by mouth daily before breakfast. Patient taking differently: Take 145 mcg by mouth 3 (three) times a week. 07/08/22   Imogene Burn, MD  montelukast (SINGULAIR) 10 MG tablet Take 10 mg by mouth every evening.    [provider]  nitroGLYCERIN (NITROSTAT) 0.4 MG SL tablet Place 0.4 mg under the tongue every 5 (five) minutes as needed for chest pain.    [provider]  Omega-3  Fatty Acids (FISH OIL BURP-LESS) 1200 MG CAPS Take 1,200 mg by mouth daily.    [provider]  pantoprazole (PROTONIX) 40 MG tablet Take 1 tablet (40 mg total) by mouth daily before breakfast. 07/08/22   Imogene Burn, MD  potassium chloride SA (KLOR-CON M) 20 MEQ tablet Take 20 mEq by mouth daily at 6 PM.    [provider]  Monte Fantasia INHUB 250-50 MCG/ACT AEPB Inhale 1 puff into the lungs in the morning and at bedtime.    [provider]    Current Facility-Administered Medications  Medication Dose Route Frequency Provider Last Rate Last Admin   0.9 %  sodium chloride infusion (Manually program via Guardrails IV Fluids)   Intravenous Once Schuman, James T, PA-C       pantoprazole (PROTONIX) injection 40 mg  40 mg Intravenous Once Schuman, James T, PA-C       Current Outpatient Medications  Medication Sig Dispense Refill   acetaminophen (TYLENOL) 500 MG tablet Take 500-1,000 mg by mouth every 6 (six) hours as needed for mild pain or headache.     albuterol (VENTOLIN HFA) 108 (90 Base) MCG/ACT inhaler Inhale 1-2 puffs into the lungs every 6 (six) hours as needed for wheezing or shortness of breath.     ascorbic acid (VITAMIN C) 500 MG tablet Take 500 mg by mouth daily.     aspirin EC 81 MG tablet Take 1 tablet (81 mg total) by mouth daily. **Start on 11/25** (Patient taking differently: Take 81 mg by mouth in the morning.) 30 tablet 11   atorvastatin (LIPITOR) 40 MG tablet Take 40 mg by mouth at bedtime.     Biotin 2500 MCG CHEW Chew 2,500 mcg by mouth 2 (two) times daily.     Cinnamon 500 MG TABS Take 500 mg by mouth 2 (two) times daily.     clopidogrel (PLAVIX) 75 MG tablet TAKE 1 TABLET BY MOUTH EVERY DAY 90 tablet 3   cyanocobalamin (VITAMIN B12) 1000 MCG tablet Take 1,000 mcg by mouth daily.     donepezil (ARICEPT) 5 MG tablet Take 5 mg by mouth at bedtime.     ferrous gluconate (FERGON) 324 MG tablet Take 324 mg by mouth daily with supper.     folic acid (FOLVITE)  800 MCG tablet Take 800 mcg by mouth daily.     furosemide (LASIX) 20 MG tablet Take 20 mg by mouth daily.      levothyroxine (SYNTHROID) 125 MCG tablet Take 125 mcg by mouth See admin instructions. Take 125 mcg by mouth before breakfast on Mon/Tues/Wed/Thurs/Fri/Sat (take nothing on Sundays)     linaclotide (LINZESS) 145 MCG CAPS capsule Take 1 capsule (145 mcg total) by mouth daily before breakfast. (Patient taking differently: Take 145 mcg by mouth 3 (three) times a week.) 30 capsule 6   montelukast (SINGULAIR) 10 MG tablet Take 10 mg by mouth every evening.     nitroGLYCERIN (NITROSTAT) 0.4 MG SL tablet Place 0.4 mg under the tongue every 5 (five) minutes as needed for chest pain.     Omega-3 Fatty Acids (FISH OIL BURP-LESS) 1200 MG CAPS Take 1,200 mg by mouth daily.     pantoprazole (PROTONIX) 40 MG tablet Take 1 tablet (40 mg total) by mouth daily before breakfast. 90 tablet 1   potassium chloride SA (KLOR-CON M) 20 MEQ tablet Take 20 mEq by mouth daily at 6 PM.     WIXELA INHUB 250-50 MCG/ACT AEPB Inhale 1 puff into the lungs in the morning and at bedtime.     Facility-Administered Medications Ordered in Other Encounters  Medication Dose Route Frequency Provider Last Rate Last Admin   0.9 %  sodium chloride infusion   Intravenous Continuous Charlott Rakes, MD        Allergies as of 04/01/2023 - Review Complete 04/01/2023  Allergen Reaction Noted   Oxycodone Shortness Of Breath 03/12/2012    Review of Systems:    Constitutional: No weight loss, fever, chills, weakness or fatigue HEENT: Eyes: No change in vision               Ears, Nose, Throat:  No change in hearing or congestion Skin: No rash or itching Cardiovascular: No chest pain, chest pressure or palpitations   Respiratory: No SOB or cough Gastrointestinal: See HPI and otherwise negative Genitourinary: No  dysuria or change in urinary frequency Neurological: No headache, dizziness or syncope Musculoskeletal: No new muscle  or joint pain Hematologic: No bleeding or bruising Psychiatric: No history of depression or anxiety     Physical Exam:  Vital signs in last 24 hours: Temp:  [98.4 F (36.9 C)] 98.4 F (36.9 C) (10/30 1119) Pulse Rate:  [75-88] 75 (10/30 1330) Resp:  [18-20] 20 (10/30 1330) BP: (118)/(57-62) 118/57 (10/30 1330) SpO2:  [99 %-100 %] 100 % (10/30 1330) Weight:  [81.2 kg] 81.2 kg (10/30 1129)   Last BM recorded by nurses in past 5 days No data recorded  General:   Pleasant, well developed female in no acute distress Head:  Normocephalic and atraumatic. Eyes: sclerae anicteric,conjunctive  Heart:  regular rate and rhythm, no murmurs or gallops Pulm: Clear anteriorly; no wheezing Abdomen:  Soft, Obese AB, Hypoactive bowel sounds. No tenderness Without guarding, Without rebound, and With rebound, No organomegaly appreciated. Extremities:  With trace edema. Msk:  Symmetrical without gross deformities. Peripheral pulses intact.  Neurologic:  Alert and  oriented x4;  No focal deficits.  Skin:   Dry and intact without significant lesions or rashes. Psychiatric:  Cooperative. Normal mood and affect.  LAB RESULTS: Recent Labs    04/01/23 0925 04/01/23 1215  WBC 5.6 6.1  HGB 5.4* 5.3*  HCT 17.8* 17.9*  PLT 340 371   BMET Recent Labs    04/01/23 0925 04/01/23 1215  NA 144 143  K 3.5 3.5  CL 109 111  CO2 29 25  GLUCOSE 104* 98  BUN 18 17  CREATININE 1.39* 1.26*  CALCIUM 8.4* 8.5*   LFT Recent Labs    04/01/23 1215  PROT 6.6  ALBUMIN 3.5  AST 16  ALT 10  ALKPHOS 85  BILITOT 1.0   PT/INR No results for input(s): "LABPROT", "INR" in the last 72 hours.  STUDIES: No results found.    Impression /Plan:   72 year old female African-American patient past medical history significant for sickle cell trait, small bowel angioectasias, PUD, achalasia s/p POEM in 2019, asthma, chronic blood loss with iron deficiency anemia and receives IV iron, DM2, HTN, HLD, AAA s/p repair  asthma, anxiety, presents for evaluation of symptomatic anemia and reported melena.   Acute on chronic symptomatic anemia, history of iron deficiency anemia secondary to upper GI blood loss/small bowel angioectasias.  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 Both treated with APC.   Complicated situation in which patient needs to be on Plavix for AAA.  However, assume that continued Plavix use will lead to continued bleeding and need for multiple procedures for AVM in the future Positive fecal occult. Hemoglobin 5.3, 9.2 on 11/12/2022.  Patient receiving 2 units of PRBCs. BUN 17/creatinine 1.26/GFR 45 Iron 13, ferritin 15 -H/H, tranfuse for hemoglobin less than 7 -Protonix 40 milligrams IV twice daily -Clear liquid diet, n.p.o. after midnight on Thursday -Scheduled enteroscopy this Friday - Hold Plavix Melena  SB angioectasias History of PUD and esophagitis  AAA and left femoral stent -Is on Plavix -currently on hold  History of colon polyps -Repeat colonoscopy due in 10/2028 if patient is interested at that time       LOS: 0 days   Thank you for your kind consultation, we will continue to follow.   Deanna J May  04/01/2023, 1:48 PM     [facility] Attending   I have taken an interval  history, reviewed the chart and examined the patient. 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  We will plan for a small bowel endoscopy, unable to perform this until Friday due to anesthesia and endoscopy unit availability.The risks and benefits as well as alternatives of endoscopic procedure(s) have been discussed and reviewed. All questions answered. The patient agrees to proceed.   Need for Plavix on a  continuous basis complicates her situation and increases her risk of chronic bleeding.  Iva Boop, MD, [facility] See Loretha Stapler on call - gastroenterology for best contact person 04/01/2023 3:51 PM

## 2023-04-01 NOTE — ED Provider Notes (Signed)
[place] EMERGENCY DEPARTMENT AT [facility] Provider Note   CSN: [id number] Arrival date & time: 04/01/23  1114     History  Chief Complaint  Patient presents with   Abnormal Lab    Lacey Vega is a 72 y.o. female history of AAA status post stent graft, history of chronic anemia, GIB, iron deficiency anemia, hypothyroidism, TIA, multiple abdominal AVMs presented for abnormal lab.  Patient was getting an iron transfusion today at the oncology center when she was told her hemoglobin was 5.4 and to come to the ER to be transfused.  Patient dates has had many times in the past and they are unsure what is happening.  Patient does note that they have found AVMs and repair of them when she gets scoped.  Patient does state that she has chronic melena but is unable to say how long it has been going on for.  Patient does note that she has become more fatigued lately and short of breath when exerting herself and thinks this may be related to the melena.  Patient denies nausea vomiting, abdominal pain, chest pain, shortness of breath, new onset weakness, change in sensation/motor skills.   Home Medications Prior to Admission medications   Medication Sig Start Date End Date Taking? Authorizing Provider  acetaminophen (TYLENOL) 500 MG tablet Take 500-1,000 mg by mouth every 6 (six) hours as needed for mild pain or headache.    [provider]  albuterol (VENTOLIN HFA) 108 (90 Base) MCG/ACT inhaler Inhale 1-2 puffs into the lungs every 6 (six) hours as needed for wheezing or shortness of breath.    [provider]  ascorbic acid (VITAMIN C) 500 MG tablet Take 500 mg by mouth daily.    [provider]  aspirin EC 81 MG tablet Take 1 tablet (81 mg total) by mouth daily. **Start on 11/25** Patient taking differently: Take 81 mg by mouth in the morning. 04/26/21   Noralee Stain, DO  atorvastatin (LIPITOR) 40 MG tablet Take 40 mg by mouth at bedtime.     [provider]  Biotin 2500 MCG CHEW Chew 2,500 mcg by mouth 2 (two) times daily.    [provider]  Cinnamon 500 MG TABS Take 500 mg by mouth 2 (two) times daily.    [provider]  clopidogrel (PLAVIX) 75 MG tablet TAKE 1 TABLET BY MOUTH EVERY DAY 07/17/22   Maeola Harman, MD  cyanocobalamin (VITAMIN B12) 1000 MCG tablet Take 1,000 mcg by mouth daily.    [provider]  donepezil (ARICEPT) 5 MG tablet Take 5 mg by mouth at bedtime.    [provider]  ferrous gluconate (FERGON) 324 MG tablet Take 324 mg by mouth daily with supper.    [provider]  folic acid (FOLVITE) 800 MCG tablet Take 800 mcg by mouth daily.    [provider]  furosemide (LASIX) 20 MG tablet Take 20 mg by mouth daily.     [provider]  levothyroxine (SYNTHROID) 125 MCG tablet Take 125 mcg by mouth See admin instructions. Take 125 mcg by mouth before breakfast on Mon/Tues/Wed/Thurs/Fri/Sat (take nothing on Sundays)    [provider]  linaclotide (LINZESS) 145 MCG CAPS capsule Take 1 capsule (145 mcg total) by mouth daily before breakfast. Patient taking differently: Take 145 mcg by mouth 3 (three) times a week. 07/08/22   Imogene Burn, MD  montelukast (SINGULAIR) 10 MG tablet Take 10 mg by mouth every evening.  [provider]  nitroGLYCERIN (NITROSTAT) 0.4 MG SL tablet Place 0.4 mg under the tongue every 5 (five) minutes as needed for chest pain.    [provider]  Omega-3 Fatty Acids (FISH OIL BURP-LESS) 1200 MG CAPS Take 1,200 mg by mouth daily.    [provider]  pantoprazole (PROTONIX) 40 MG tablet Take 1 tablet (40 mg total) by mouth daily before breakfast. 07/08/22   Imogene Burn, MD  potassium chloride SA (KLOR-CON M) 20 MEQ tablet Take 20 mEq by mouth daily at 6 PM.    [provider]  Monte Fantasia INHUB 250-50 MCG/ACT AEPB Inhale 1 puff into the lungs in the morning and at bedtime.     [provider]      Allergies    Oxycodone    Review of Systems   Review of Systems  Physical Exam Updated Vital Signs BP (!) 118/57   Pulse 75   Temp 98.4 F (36.9 C) (Oral)   Resp 20   Ht 5\' 7"  (1.702 m)   Wt 81.2 kg   SpO2 100%   BMI 28.04 kg/m  Physical Exam Vitals reviewed.  Constitutional:      General: She is not in acute distress. HENT:     Head: Normocephalic and atraumatic.  Eyes:     Extraocular Movements: Extraocular movements intact.     Conjunctiva/sclera: Conjunctivae normal.     Pupils: Pupils are equal, round, and reactive to light.  Cardiovascular:     Rate and Rhythm: Normal rate and regular rhythm.     Pulses: Normal pulses.     Heart sounds: Normal heart sounds.     Comments: 2+ bilateral radial/dorsalis pedis pulses with regular rate Pulmonary:     Effort: Pulmonary effort is normal. No respiratory distress.     Breath sounds: Normal breath sounds.  Abdominal:     Palpations: Abdomen is soft.     Tenderness: There is no abdominal tenderness. There is no guarding or rebound.  Genitourinary:    Comments: Chaperone: Salvadore Farber, RN Gross melena noted No other rectal abnormalities noted Musculoskeletal:        General: Normal range of motion.     Cervical back: Normal range of motion and neck supple.     Comments: 5 out of 5 bilateral grip/leg extension strength  Skin:    General: Skin is warm and dry.     Capillary Refill: Capillary refill takes 2 to 3 seconds.  Neurological:     General: No focal deficit present.     Mental Status: She is alert and oriented to person, place, and time.     Comments: Sensation intact in all 4 limbs  Psychiatric:        Mood and Affect: Mood normal.     ED Results / Procedures / Treatments   Labs (all labs ordered are listed, but only abnormal results are displayed) Labs Reviewed  CBC WITH DIFFERENTIAL/PLATELET - Abnormal; Notable for the following components:      Result Value   RBC  2.09 (*)    Hemoglobin 5.3 (*)    HCT 17.9 (*)    MCH 25.4 (*)    MCHC 29.6 (*)    RDW 16.1 (*)    All other components within normal limits  COMPREHENSIVE METABOLIC PANEL - Abnormal; Notable for the following components:   Creatinine, Ser 1.26 (*)    Calcium 8.5 (*)    GFR, Estimated 45 (*)    All  other components within normal limits  OCCULT BLOOD X 1 CARD TO LAB, STOOL  POC OCCULT BLOOD, ED  TYPE AND SCREEN  PREPARE RBC (CROSSMATCH)    EKG None  Radiology No results found.  Procedures .Critical Care  Performed by: Netta Corrigan, PA-C Authorized by: Netta Corrigan, PA-C   Critical care provider statement:    Critical care time (minutes):  40   Critical care time was exclusive of:  Separately billable procedures and treating other patients   Critical care was necessary to treat or prevent imminent or life-threatening deterioration of the following conditions: GIB, anemia requiring transfusion.   Critical care was time spent personally by me on the following activities:  Blood draw for specimens, development of treatment plan with patient or surrogate, discussions with consultants, evaluation of patient's response to treatment, examination of patient, obtaining history from patient or surrogate, review of old charts, re-evaluation of patient's condition, pulse oximetry, ordering and review of radiographic studies, ordering and review of laboratory studies and ordering and performing treatments and interventions   I assumed direction of critical care for this patient from another provider in my specialty: no     Care discussed with: admitting provider       Medications Ordered in ED Medications  0.9 %  sodium chloride infusion (Manually program via Guardrails IV Fluids) (has no administration in time range)  pantoprazole (PROTONIX) injection 40 mg (40 mg Intravenous Given 04/01/23 1405)    ED Course/ Medical Decision Making/ A&P                                  Medical Decision Making Amount and/or Complexity of Data Reviewed Labs: ordered. Radiology: ordered.  Risk Prescription drug management. Decision regarding hospitalization.   Beatrix [place] 72 y.o. presented today for GIB. Working DDx that I considered at this time includes, but not limited to, Esophagitis, Mallory Weiss/Boerhaave, Variceal bleeding, PUD/gastritis/ulcers, diverticular bleed, colon cancer, rectal bleed, internal/external hemorrhoids  R/o DDx: Esophagitis, Mallory Weiss/Boerhaave, Variceal bleeding, diverticular bleed, colon cancer, rectal bleed, internal/external hemorrhoids: These are considered less likely due to history of present illness, physical exam, labs/imaging findings  Review of prior external notes: /8/24 discharge summary  Unique Tests and My Interpretation:  CBC: Hemoglobin 5.3 CMP: Unremarkable, at baseline Type and screen: Give antibody screen Chest x-ray: no perforation noted Fecal occult: positive  Social Determinants of Health: none  Discussion with Independent Historian: None  Discussion of Management of Tests:  [place], PA-C Micheal Likens, MD Hospitalist  Risk: High: hospitalization or escalation of hospital-level care  Risk Stratification Score: none  Staffed with Belfi, MD  Plan: On exam patient was in no acute distress stable vitals.  Patient's physical exam was largely unremarkable.  With a chaperone a rectal exam was conducted that does show gross melena and so we will start patient on Protonix.  Patient does have history of chronic anemia related to GI bleeds and unclear at this time if patient is for new abdominal AVMs as she has had multiple in the past or what is contributing patient's melena as this does appear chronic.  Patient's hemoglobin at the oncology center was 5.4 however we will repeated today.  Anticipate blood transfusions with GI consult for patient be scoped along with admission due to symptomatic  anemia.  Patient's hgb is 5.3 requiring transfusion Patient does not have any religious beliefs that would restrict them  from receiving blood. I spoke to the patient about the need for a blood transfusion and the risks associated with this such as transfusion reaction, allergic reaction, incompatibility, hemolysis, circulatory overload, etc.  Patient verbalized understanding and acceptance of these risks. Conversation witnessed by Salvadore Farber, RN We will transfuse 2 units. Type and screen has already been ordered.  I spoke to GI and they state that they will scope her tomorrow.  Spoke to the hospitalist and patient was also accepted for admission for symptomatic anemia suspected GI bleed.  Patient stable for admission at this time.  This chart was dictated using voice recognition software.  Despite best efforts to proofread,  errors can occur which can change the documentation meaning.         Final Clinical Impression(s) / ED Diagnoses Final diagnoses:  Symptomatic anemia  Melena    Rx / DC Orders ED Discharge Orders     None         Remi Deter 04/01/23 1448    Rolan Bucco, MD 04/01/23 [phone]

## 2023-04-01 NOTE — ED Provider Triage Note (Signed)
Emergency Medicine Provider Triage Evaluation Note  Lacey Vega , a 73 y.o. female  was evaluated in triage.  Pt complains of abnormal lab.  Patient states that her blood was drawn recently and that she got a call saying that her hemoglobin was 5 something today.  Patient does endorse having darker stools for a long time but is unable to give a definitive timeframe.  Patient does endorse generalized fatigue but denies chest pain or shortness of breath.  Patient denies abdominal pain.  Patient is on aspirin and Plavix but denies any other blood thinners.  Patient has abdominal pain, leg swelling.  Review of Systems  Positive:  Negative:   Physical Exam  BP 118/62 (BP Location: Left Arm)   Pulse 88   Temp 98.4 F (36.9 C) (Oral)   Resp 18   Ht 5\' 7"  (1.702 m)   Wt 81.2 kg   SpO2 99%   BMI 28.04 kg/m  Gen:   Awake, no distress   Resp:  Normal effort  MSK:   Moves extremities without difficulty  Other:  Abd non TTP  Medical Decision Making  Medically screening exam initiated at 11:38 AM.  Appropriate orders placed.  Lacey Vega was informed that the remainder of the evaluation will be completed by another provider, this initial triage assessment does not replace that evaluation, and the importance of remaining in the ED until their evaluation is complete.  Workup initiated, suspect upper GI bleed with symptomatic anemia, patient stable at this time   Netta Corrigan, Cordelia Poche 04/01/23 1140

## 2023-04-01 NOTE — Telephone Encounter (Signed)
TCT patient regarding results of her CBC from today. Spoke with her. Advised that her HGB is very low @ 5.4. This is about half of what it normally is. Pt states she very weak, light headed and dizzy. Advised that Dr. Leonides Schanz recommends she go to the ED this morning to be evaluated for bleeding and to get blood transfusions. Pt voiced understanding and will have her daughter or granddaughter take her to the ED @ [facility] this morning.  Charge nurse @ [facility] has been notified.

## 2023-04-02 DIAGNOSIS — K921 Melena: Secondary | ICD-10-CM | POA: Diagnosis not present

## 2023-04-02 DIAGNOSIS — K31819 Angiodysplasia of stomach and duodenum without bleeding: Secondary | ICD-10-CM | POA: Diagnosis not present

## 2023-04-02 DIAGNOSIS — D500 Iron deficiency anemia secondary to blood loss (chronic): Secondary | ICD-10-CM | POA: Diagnosis not present

## 2023-04-02 DIAGNOSIS — D649 Anemia, unspecified: Secondary | ICD-10-CM | POA: Diagnosis not present

## 2023-04-02 LAB — COMPREHENSIVE METABOLIC PANEL
ALT: 9 U/L (ref 0–44)
ALT: 10 U/L (ref 0–44)
AST: 18 U/L (ref 15–41)
AST: 17 U/L (ref 15–41)
Albumin: 3.4 g/dL — ABNORMAL LOW (ref 3.5–5.0)
Albumin: 3.5 g/dL (ref 3.5–5.0)
Alkaline Phosphatase: 91 U/L (ref 38–126)
Alkaline Phosphatase: 96 U/L (ref 38–126)
Anion gap: 7 (ref 5–15)
Anion gap: 6 (ref 5–15)
BUN: 15 mg/dL (ref 8–23)
BUN: 15 mg/dL (ref 8–23)
CO2: 26 mmol/L (ref 22–32)
CO2: 28 mmol/L (ref 22–32)
Calcium: 8.5 mg/dL — ABNORMAL LOW (ref 8.9–10.3)
Calcium: 8.7 mg/dL — ABNORMAL LOW (ref 8.9–10.3)
Chloride: 108 mmol/L (ref 98–111)
Chloride: 107 mmol/L (ref 98–111)
Creatinine, Ser: 1.3 mg/dL — ABNORMAL HIGH (ref 0.44–1.00)
Creatinine, Ser: 1.34 mg/dL — ABNORMAL HIGH (ref 0.44–1.00)
GFR, Estimated: 43 mL/min — ABNORMAL LOW (ref >60)
GFR, Estimated: 42 mL/min — ABNORMAL LOW (ref >60)
Glucose, Bld: 94 mg/dL (ref 70–99)
Glucose, Bld: 109 mg/dL — ABNORMAL HIGH (ref 70–99)
Potassium: 3.5 mmol/L (ref 3.5–5.1)
Potassium: 3.5 mmol/L (ref 3.5–5.1)
Sodium: 141 mmol/L (ref 135–145)
Sodium: 141 mmol/L (ref 135–145)
Total Bilirubin: 1.5 mg/dL — ABNORMAL HIGH (ref 0.3–1.2)
Total Bilirubin: 1.5 mg/dL — ABNORMAL HIGH (ref 0.3–1.2)
Total Protein: 6.4 g/dL — ABNORMAL LOW (ref 6.5–8.1)
Total Protein: 7 g/dL (ref 6.5–8.1)

## 2023-04-02 LAB — PROTIME-INR
INR: 1.1 (ref 0.8–1.2)
Prothrombin Time: 13.9 s (ref 11.4–15.2)

## 2023-04-02 LAB — CBC WITH DIFFERENTIAL/PLATELET
Abs Immature Granulocytes: 0.02 10*3/uL (ref 0.00–0.07)
Basophils Absolute: 0 10*3/uL (ref 0.0–0.1)
Basophils Relative: 0 %
Eosinophils Absolute: 0.2 10*3/uL (ref 0.0–0.5)
Eosinophils Relative: 3 %
HCT: 24.3 % — ABNORMAL LOW (ref 36.0–46.0)
Hemoglobin: 7.7 g/dL — ABNORMAL LOW (ref 12.0–15.0)
Immature Granulocytes: 0 %
Lymphocytes Relative: 14 %
Lymphs Abs: 0.9 10*3/uL (ref 0.7–4.0)
MCH: 26.6 pg (ref 26.0–34.0)
MCHC: 31.7 g/dL (ref 30.0–36.0)
MCV: 83.8 fL (ref 80.0–100.0)
Monocytes Absolute: 0.6 10*3/uL (ref 0.1–1.0)
Monocytes Relative: 9 %
Neutro Abs: 5 10*3/uL (ref 1.7–7.7)
Neutrophils Relative %: 74 %
Platelets: 353 10*3/uL (ref 150–400)
RBC: 2.9 MIL/uL — ABNORMAL LOW (ref 3.87–5.11)
RDW: 15.8 % — ABNORMAL HIGH (ref 11.5–15.5)
WBC: 6.8 10*3/uL (ref 4.0–10.5)
nRBC: 0 % (ref 0.0–0.2)

## 2023-04-02 LAB — CBC
HCT: 26.9 % — ABNORMAL LOW (ref 36.0–46.0)
Hemoglobin: 8.2 g/dL — ABNORMAL LOW (ref 12.0–15.0)
MCH: 26.2 pg (ref 26.0–34.0)
MCHC: 30.5 g/dL (ref 30.0–36.0)
MCV: 85.9 fL (ref 80.0–100.0)
Platelets: 388 10*3/uL (ref 150–400)
RBC: 3.13 MIL/uL — ABNORMAL LOW (ref 3.87–5.11)
RDW: 15.9 % — ABNORMAL HIGH (ref 11.5–15.5)
WBC: 6.7 10*3/uL (ref 4.0–10.5)
nRBC: 0 % (ref 0.0–0.2)

## 2023-04-02 LAB — HEMOGLOBIN AND HEMATOCRIT, BLOOD
HCT: 27.2 % — ABNORMAL LOW (ref 36.0–46.0)
Hemoglobin: 8.4 g/dL — ABNORMAL LOW (ref 12.0–15.0)

## 2023-04-02 MED ORDER — ONDANSETRON HCL 4 MG/2ML IJ SOLN: 4.0000 mg | Freq: Four times a day (QID) | INTRAMUSCULAR | Status: DC | PRN

## 2023-04-02 MED ORDER — IRON SUCROSE 300 MG IVPB - SIMPLE MED
300.0000 mg | Status: DC
  Administered 2023-04-02 – 2023-04-04 (×3): 300 mg via INTRAVENOUS
  Filled 2023-04-02: qty 265
  Filled 2023-04-02: qty 200
  Filled 2023-04-02: qty 300
  Filled 2023-04-02: qty 200

## 2023-04-02 MED ORDER — MONTELUKAST SODIUM 10 MG PO TABS
10.0000 mg | ORAL_TABLET | Freq: Every evening | ORAL | Status: DC
  Administered 2023-04-02 – 2023-04-04 (×3): 10 mg via ORAL
  Filled 2023-04-02 (×3): qty 1

## 2023-04-02 MED ORDER — DONEPEZIL HCL 5 MG PO TABS
5.0000 mg | ORAL_TABLET | Freq: Every day | ORAL | Status: DC
  Administered 2023-04-02 – 2023-04-04 (×3): 5 mg via ORAL
  Filled 2023-04-02 (×3): qty 1

## 2023-04-02 MED ORDER — FUROSEMIDE 20 MG PO TABS
20.0000 mg | ORAL_TABLET | Freq: Every day | ORAL | Status: DC
  Administered 2023-04-02 – 2023-04-04 (×3): 20 mg via ORAL
  Filled 2023-04-02 (×3): qty 1

## 2023-04-02 MED ORDER — ACETAMINOPHEN 325 MG PO TABS
650.0000 mg | ORAL_TABLET | Freq: Four times a day (QID) | ORAL | Status: DC | PRN
  Administered 2023-04-03: 650 mg via ORAL
  Filled 2023-04-02: qty 2

## 2023-04-02 MED ORDER — ACETAMINOPHEN 650 MG RE SUPP: 650.0000 mg | Freq: Four times a day (QID) | RECTAL | Status: DC | PRN

## 2023-04-02 MED ORDER — ONDANSETRON HCL 4 MG PO TABS: 4.0000 mg | ORAL_TABLET | Freq: Four times a day (QID) | ORAL | Status: DC | PRN

## 2023-04-02 MED ORDER — ATORVASTATIN CALCIUM 40 MG PO TABS
40.0000 mg | ORAL_TABLET | Freq: Every day | ORAL | Status: DC
  Administered 2023-04-02 – 2023-04-04 (×3): 40 mg via ORAL
  Filled 2023-04-02 (×3): qty 1

## 2023-04-02 MED ORDER — LEVOTHYROXINE SODIUM 25 MCG PO TABS
125.0000 ug | ORAL_TABLET | Freq: Every day | ORAL | Status: DC
  Administered 2023-04-02 – 2023-04-05 (×4): 125 ug via ORAL
  Filled 2023-04-02 (×4): qty 1

## 2023-04-02 NOTE — Progress Notes (Addendum)
Progress Note  Primary GI: Dr. Leonides Schanz  LOS: 1 day   Chief Complaint:anemia, melena   Subjective   Pt lying in bed, reports she did not sleep well due to bed. Last reported BM was yesterday evening and dark tarry stool noted. Denies SOB, CP, or dizziness. No abdominal pain, nausea or vomiting. Tolerating Clear liquid. We discussed plan for procedure tomorrow, pt agrees. Answered all her questions.  No family was present at the time of my evaluation.   Objective   Vital signs in last 24 hours: Temp:  [98.1 F (36.7 C)-98.7 F (37.1 C)] 98.1 F (36.7 C) (10/31 0730) Pulse Rate:  [65-88] 70 (10/31 0730) Resp:  [14-22] 19 (10/31 0730) BP: (105-136)/(55-78) 121/71 (10/31 0730) SpO2:  [99 %-100 %] 100 % (10/31 0730) Weight:  [81.2 kg] 81.2 kg (10/30 1129)   Last BM recorded by nurses in past 5 days No data recorded  General:   female in no acute distress  Heart:  Regular rate and rhythm; no murmurs Pulm: Clear anteriorly; no wheezing, on RA Abdomen: soft, nondistended, hypoactive bowel sounds in all quadrants. Nontender without guarding. No organomegaly appreciated. Extremities:  Trace edema in bilateral lower extremities Neurologic:  Alert and  oriented x4;  No focal deficits.  Psych:  Cooperative. Normal mood and affect.    Studies/Results:  Lab Results: Recent Labs    04/01/23 0925 04/01/23 1215 04/02/23 0622  WBC 5.6 6.1 6.8  HGB 5.4* 5.3* 7.7*  HCT 17.8* 17.9* 24.3*  PLT 340 371 353   BMET Recent Labs    04/01/23 0925 04/01/23 1215 04/02/23 0622  NA 144 143 141  K 3.5 3.5 3.5  CL 109 111 108  CO2 29 25 26   GLUCOSE 104* 98 94  BUN 18 17 15   CREATININE 1.39* 1.26* 1.30*  CALCIUM 8.4* 8.5* 8.5*   LFT Recent Labs    04/02/23 0622  PROT 6.4*  ALBUMIN 3.4*  AST 18  ALT 9  ALKPHOS 91  BILITOT 1.5*   PT/INR Recent Labs    04/02/23 0622  LABPROT 13.9  INR 1.1     Scheduled Meds:  sodium chloride   Intravenous Once   pantoprazole  (PROTONIX) IV  40 mg Intravenous Q12H   Continuous Infusions:    Patient Narrative:   Bay Donavan Foil Mouch is a 72 y.o. female with past medical history significant for  sickle cell trait, small bowel angioectasias, PUD, achalasia s/p POEM in 2019, asthma, chronic blood loss with iron deficiency anemia and receives IV iron, DM2, HTN, HLD, AAA (on plavix) s/p repair asthma, anxiety, presents for evaluation of symptomatic anemia (discovered during infusion at oncology center) and reported melena. Hgb was 5.4 and patient received 2 units PRBC's.   Impression/Plan:   72 year old female African-American patient past medical history significant for sickle cell trait, small bowel angioectasias, PUD, achalasia s/p POEM in 2019, asthma, chronic blood loss with iron deficiency anemia and receives IV iron, DM2, HTN, HLD, AAA s/p repair asthma, anxiety, presents for evaluation of symptomatic anemia and reported melena.   Acute on chronic symptomatic anemia, history of iron deficiency anemia secondary to upper GI blood loss/small bowel angioectasias.  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 Both treated with APC.  Pt reports dark tarry BM yesterday. Complicated situation in which patient needs to be on Plavix for AAA.  However, assume that continued Plavix use will lead to  continued bleeding and need for multiple procedures for AVM in the future Positive fecal occult. Hgb 5.3 upon admission, 9.2 on 11/12/2022.  Patient receiving 2 units of PRBCs. Hgb today is 7.7. WBC 6.8 BUN 15/creatinine 1.30/GFR 43  (10/30) Iron 13, ferritin 15 -H/H, tranfuse for hemoglobin less than 7 -Protonix 40 milligrams IV twice daily -Clear liquid diet, n.p.o. after midnight  -Scheduled enteroscopy this Friday - Hold Plavix Melena  SB angioectasias History of PUD and esophagitis   AAA and left femoral  stent -Is on Plavix -currently on hold   History of colon polyps -Repeat colonoscopy due in 10/2028 if patient is interested at that time     Margarite Gouge May  04/02/2023, 8:59 AM  Gastroenterology attending:  I have also seen the patient today and reviewed the chart in this note.  The patient is improved after transfusion.  Plan for enteroscopy and ablation of any visible AVMs tomorrow.  Consider clip application versus PRS that application after therapy given that Plavix is only been held recently.  Beyond that with ferritin of 15 think she needs consideration for parenteral iron either here or as an outpatient.  Given the problems she is encountered I think administering a dose in the hospital would be beneficial and potentially prevent readmission.  Iva Boop, MD, [facility]  Gastroenterology See Loretha Stapler on call - gastroenterology for best contact person 04/02/2023 4:44 PM

## 2023-04-02 NOTE — H&P (View-Only) (Signed)
Progress Note  Primary GI: Dr. Leonides Schanz  LOS: 1 day   Chief Complaint:anemia, melena   Subjective   Pt lying in bed, reports she did not sleep well due to bed. Last reported BM was yesterday evening and dark tarry stool noted. Denies SOB, CP, or dizziness. No abdominal pain, nausea or vomiting. Tolerating Clear liquid. We discussed plan for procedure tomorrow, pt agrees. Answered all her questions.  No family was present at the time of my evaluation.   Objective   Vital signs in last 24 hours: Temp:  [98.1 F (36.7 C)-98.7 F (37.1 C)] 98.1 F (36.7 C) (10/31 0730) Pulse Rate:  [65-88] 70 (10/31 0730) Resp:  [14-22] 19 (10/31 0730) BP: (105-136)/(55-78) 121/71 (10/31 0730) SpO2:  [99 %-100 %] 100 % (10/31 0730) Weight:  [81.2 kg] 81.2 kg (10/30 1129)   Last BM recorded by nurses in past 5 days No data recorded  General:   female in no acute distress  Heart:  Regular rate and rhythm; no murmurs Pulm: Clear anteriorly; no wheezing, on RA Abdomen: soft, nondistended, hypoactive bowel sounds in all quadrants. Nontender without guarding. No organomegaly appreciated. Extremities:  Trace edema in bilateral lower extremities Neurologic:  Alert and  oriented x4;  No focal deficits.  Psych:  Cooperative. Normal mood and affect.    Studies/Results:  Lab Results: Recent Labs    04/01/23 0925 04/01/23 1215 04/02/23 0622  WBC 5.6 6.1 6.8  HGB 5.4* 5.3* 7.7*  HCT 17.8* 17.9* 24.3*  PLT 340 371 353   BMET Recent Labs    04/01/23 0925 04/01/23 1215 04/02/23 0622  NA 144 143 141  K 3.5 3.5 3.5  CL 109 111 108  CO2 29 25 26   GLUCOSE 104* 98 94  BUN 18 17 15   CREATININE 1.39* 1.26* 1.30*  CALCIUM 8.4* 8.5* 8.5*   LFT Recent Labs    04/02/23 0622  PROT 6.4*  ALBUMIN 3.4*  AST 18  ALT 9  ALKPHOS 91  BILITOT 1.5*   PT/INR Recent Labs    04/02/23 0622  LABPROT 13.9  INR 1.1     Scheduled Meds:  sodium chloride   Intravenous Once   pantoprazole  (PROTONIX) IV  40 mg Intravenous Q12H   Continuous Infusions:    Patient Narrative:   Lacey Vega is a 72 y.o. female with past medical history significant for  sickle cell trait, small bowel angioectasias, PUD, achalasia s/p POEM in 2019, asthma, chronic blood loss with iron deficiency anemia and receives IV iron, DM2, HTN, HLD, AAA (on plavix) s/p repair asthma, anxiety, presents for evaluation of symptomatic anemia (discovered during infusion at oncology center) and reported melena. Hgb was 5.4 and patient received 2 units PRBC's.   Impression/Plan:   72 year old female African-American patient past medical history significant for sickle cell trait, small bowel angioectasias, PUD, achalasia s/p POEM in 2019, asthma, chronic blood loss with iron deficiency anemia and receives IV iron, DM2, HTN, HLD, AAA s/p repair asthma, anxiety, presents for evaluation of symptomatic anemia and reported melena.   Acute on chronic symptomatic anemia, history of iron deficiency anemia secondary to upper GI blood loss/small bowel angioectasias.  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 Both treated with APC.  Pt reports dark tarry BM yesterday. Complicated situation in which patient needs to be on Plavix for AAA.  However, assume that continued Plavix use will lead to  continued bleeding and need for multiple procedures for AVM in the future Positive fecal occult. Hgb 5.3 upon admission, 9.2 on 11/12/2022.  Patient receiving 2 units of PRBCs. Hgb today is 7.7. WBC 6.8 BUN 15/creatinine 1.30/GFR 43  (10/30) Iron 13, ferritin 15 -H/H, tranfuse for hemoglobin less than 7 -Protonix 40 milligrams IV twice daily -Clear liquid diet, n.p.o. after midnight  -Scheduled enteroscopy this Friday - Hold Plavix Melena  SB angioectasias History of PUD and esophagitis   AAA and left femoral  stent -Is on Plavix -currently on hold   History of colon polyps -Repeat colonoscopy due in 10/2028 if patient is interested at that time     Lacey Vega May  04/02/2023, 8:59 AM  Gastroenterology attending:  I have also seen the patient today and reviewed the chart in this note.  The patient is improved after transfusion.  Plan for enteroscopy and ablation of any visible AVMs tomorrow.  Consider clip application versus PRS that application after therapy given that Plavix is only been held recently.  Beyond that with ferritin of 15 think she needs consideration for parenteral iron either here or as an outpatient.  Given the problems she is encountered I think administering a dose in the hospital would be beneficial and potentially prevent readmission.  Iva Boop, MD, [facility]  Gastroenterology See Loretha Stapler on call - gastroenterology for best contact person 04/02/2023 4:44 PM

## 2023-04-02 NOTE — ED Notes (Signed)
ED TO INPATIENT HANDOFF REPORT  ED Nurse Name and Phone #: Azriel Dancy N.  S Name/Age/Gender Lacey Vega 72 y.o. female Room/Bed: WA14/WA14  Code Status   Code Status: Full Code  Home/SNF/Other Home Patient oriented to: self, place, time, and situation Is this baseline? Yes   Triage Complete: Triage complete  Chief Complaint GI bleed [K92.2]  Triage Note Patient arrives ambulatory by POV stating she was sent by cancer center for blood transfusion. Patient reports chronic dark stools and states doctor is aware. Patient reports fatigue on going about 2-3 weeks. Patient playing on ipad and speaking on cell phone while RN attempting to triage.    Allergies Allergies  Allergen Reactions   Oxycodone Shortness Of Breath    Level of Care/Admitting Diagnosis ED Disposition     ED Disposition  Admit   Condition  --   Comment  Hospital Area: [facility] [[id number]]  Level of Care: Telemetry [5]  Admit to tele based on following criteria: Other see comments  Comments: anemia  May admit patient to [facility] or [facility] if equivalent level of care is available:: Yes  Covid Evaluation: Asymptomatic - no recent exposure (last 10 days) testing not required  Diagnosis: GI bleed [161096]  Admitting Physician: Rolly Salter [[id number]]  Attending Physician: Rolly Salter [[id number]]  Certification:: I certify there are rare and unusual circumstances requiring inpatient admission  Expected Medical Readiness: 04/03/2023          B Medical/Surgery History Past Medical History:  Diagnosis Date   Anemia    Anxiety    Asthma    Asthma    Bronchitis    hx   Colon polyps    hyperplastic   Diabetes (HCC)    mild, history of   DJD (degenerative joint disease), cervical    Dysphagia    history of   Dyspnea    Gallstones    GERD (gastroesophageal reflux disease)    H/O chest pain    secondary to anemia   Headache    History of arteriovenous  malformation (AVM)    History of blood transfusion 10/2021   History of hiatal hernia    History of tobacco use    Hyperlipemia    Hypertension    Hypothyroidism    Iron deficiency anemia    Sickle cell anemia (HCC)    "patient is not aware of this"   Thyroid disease    TIA (transient ischemic attack)    "was told that she possible had one"   Wears dentures    Wears glasses    Past Surgical History:  Procedure Laterality Date   ABDOMINAL AORTIC ENDOVASCULAR STENT GRAFT Bilateral 01/31/2021   Procedure: ENDOVASCULAR ANEURYSM REPAIR (EVAR)Bilateral Groin Cutdown, left femoral endaterectomy with bovine patch angioplasty.;  Surgeon: Leonie Douglas, MD;  Location: [facility] OR;  Service: Vascular;  Laterality: Bilateral;   ABDOMINAL HYSTERECTOMY     ANTERIOR CERVICAL DECOMP/DISCECTOMY FUSION N/A 10/03/2016   Procedure: ANTERIOR CERVICAL DECOMPRESSION/DISCECTOMY FUSION CERVICAL THREE- CERVICAL FOUR;  Surgeon: Coletta Memos, MD;  Location: [facility] OR;  Service: Neurosurgery;  Laterality: N/A;  Right side approach   BOTOX INJECTION  08/29/2015   Procedure: BOTOX INJECTION;  Surgeon: Charlott Rakes, MD;  Location: [facility] ENDOSCOPY;  Service: Endoscopy;;   CHOLECYSTECTOMY     COLONOSCOPY  12/03/2011   Procedure: COLONOSCOPY;  Surgeon: Shirley Friar, MD;  Location: [facility] ENDOSCOPY;  Service: Endoscopy;  Laterality: N/A;   COLONOSCOPY N/A 11/27/2021  Procedure: COLONOSCOPY;  Surgeon: Imogene Burn, MD;  Location: [facility] ENDOSCOPY;  Service: Gastroenterology;  Laterality: N/A;   ENTEROSCOPY N/A 07/07/2021   Procedure: ENTEROSCOPY;  Surgeon: Vida Rigger, MD;  Location: [facility] ENDOSCOPY;  Service: Endoscopy;  Laterality: N/A;   ENTEROSCOPY N/A 08/06/2021   Procedure: ENTEROSCOPY;  Surgeon: Kerin Salen, MD;  Location: [facility] ENDOSCOPY;  Service: Gastroenterology;  Laterality: N/A;   ENTEROSCOPY N/A 11/27/2021   Procedure: SMALL  BOWEL ENTEROSCOPY;  Surgeon: Imogene Burn, MD;  Location: [facility] ENDOSCOPY;  Service: Gastroenterology;   Laterality: N/A;   ENTEROSCOPY N/A 11/07/2022   Procedure: ENTEROSCOPY;  Surgeon: Lynann Bologna, MD;  Location: [facility] ENDOSCOPY;  Service: Gastroenterology;  Laterality: N/A;   ESOPHAGEAL MANOMETRY N/A 07/13/2017   Procedure: ESOPHAGEAL MANOMETRY (EM);  Surgeon: Charlott Rakes, MD;  Location: [facility] ENDOSCOPY;  Service: Endoscopy;  Laterality: N/A;   ESOPHAGOGASTRODUODENOSCOPY  12/03/2011   Procedure: ESOPHAGOGASTRODUODENOSCOPY (EGD);  Surgeon: Shirley Friar, MD;  Location: [facility] ENDOSCOPY;  Service: Endoscopy;  Laterality: N/A;   ESOPHAGOGASTRODUODENOSCOPY (EGD) WITH PROPOFOL N/A 08/29/2015   Procedure: ESOPHAGOGASTRODUODENOSCOPY (EGD) WITH PROPOFOL;  Surgeon: Charlott Rakes, MD;  Location: [facility] ENDOSCOPY;  Service: Endoscopy;  Laterality: N/A;   ESOPHAGOGASTRODUODENOSCOPY (EGD) WITH PROPOFOL N/A 05/17/2019   Procedure: ESOPHAGOGASTRODUODENOSCOPY (EGD) WITH PROPOFOL;  Surgeon: Charlott Rakes, MD;  Location: [facility] ENDOSCOPY;  Service: Endoscopy;  Laterality: N/A;   ESOPHAGOGASTRODUODENOSCOPY (EGD) WITH PROPOFOL N/A 04/24/2021   Procedure: ESOPHAGOGASTRODUODENOSCOPY (EGD) WITH PROPOFOL;  Surgeon: Kathi Der, MD;  Location: [facility] ENDOSCOPY;  Service: Gastroenterology;  Laterality: N/A;   GIVENS CAPSULE STUDY N/A 08/05/2018   Procedure: GIVENS CAPSULE STUDY;  Surgeon: Charlott Rakes, MD;  Location: [facility] ENDOSCOPY;  Service: Endoscopy;  Laterality: N/A;   GIVENS CAPSULE STUDY N/A 07/06/2021   Procedure: GIVENS CAPSULE STUDY;  Surgeon: Vida Rigger, MD;  Location: [facility] ENDOSCOPY;  Service: Endoscopy;  Laterality: N/A;   HOT HEMOSTASIS  12/03/2011   Procedure: HOT HEMOSTASIS (ARGON PLASMA COAGULATION/BICAP);  Surgeon: Shirley Friar, MD;  Location: [facility] ENDOSCOPY;  Service: Endoscopy;  Laterality: N/A;   HOT HEMOSTASIS N/A 07/07/2021   Procedure: HOT HEMOSTASIS (ARGON PLASMA COAGULATION/BICAP);  Surgeon: Vida Rigger, MD;  Location: [facility] ENDOSCOPY;  Service: Endoscopy;  Laterality: N/A;   HOT HEMOSTASIS N/A  08/06/2021   Procedure: HOT HEMOSTASIS (ARGON PLASMA COAGULATION/BICAP);  Surgeon: Kerin Salen, MD;  Location: [facility] ENDOSCOPY;  Service: Gastroenterology;  Laterality: N/A;   HOT HEMOSTASIS  11/27/2021   Procedure: HOT HEMOSTASIS (ARGON PLASMA COAGULATION/BICAP);  Surgeon: Imogene Burn, MD;  Location: [facility] ENDOSCOPY;  Service: Gastroenterology;;  EGD and COLON   HOT HEMOSTASIS N/A 11/07/2022   Procedure: HOT HEMOSTASIS (ARGON PLASMA COAGULATION/BICAP);  Surgeon: Lynann Bologna, MD;  Location: [facility] ENDOSCOPY;  Service: Gastroenterology;  Laterality: N/A;   NECK SURGERY     PERIPHERAL VASCULAR INTERVENTION  03/29/2021   Procedure: PERIPHERAL VASCULAR INTERVENTION;  Surgeon: Leonie Douglas, MD;  Location: [facility] INVASIVE CV LAB;  Service: Cardiovascular;;  Lt SFA Brachial Approach   POLYPECTOMY  11/27/2021   Procedure: POLYPECTOMY;  Surgeon: Imogene Burn, MD;  Location: [facility] ENDOSCOPY;  Service: Gastroenterology;;   ROTATOR CUFF REPAIR     right   ROTATOR CUFF REPAIR Right 2014   THROMBECTOMY FEMORAL ARTERY Left 01/31/2021   Procedure: THROMBECTOMY POPLITEAL  ARTERY;  Surgeon: Leonie Douglas, MD;  Location: [facility] OR;  Service: Vascular;  Laterality: Left;   TONSILLECTOMY       A IV Location/Drains/Wounds Patient Lines/Drains/Airways Status     Active Line/Drains/Airways  Name Placement date Placement time Site Days   Peripheral IV 04/01/23 20 G Anterior;Distal;Left Forearm 04/01/23  1230  Forearm  1   Peripheral IV 04/01/23 20 G Right Antecubital 04/01/23  --  Antecubital  1            Intake/Output Last 24 hours  Intake/Output Summary (Last 24 hours) at 04/02/2023 1137 Last data filed at 04/01/2023 2100 Gross per 24 hour  Intake 1058.5 ml  Output --  Net 1058.5 ml    Labs/Imaging Results for orders placed or performed during the hospital encounter of 04/01/23 (from the past 48 hour(s))  CBC with Differential     Status: Abnormal   Collection Time: 04/01/23 12:15 PM  Result Value Ref  Range   WBC 6.1 4.0 - 10.5 K/uL   RBC 2.09 (L) 3.87 - 5.11 MIL/uL   Hemoglobin 5.3 (LL) 12.0 - 15.0 g/dL    Comment: REPEATED TO VERIFY   HCT 17.9 (L) 36.0 - 46.0 %   MCV 85.6 80.0 - 100.0 fL   MCH 25.4 (L) 26.0 - 34.0 pg   MCHC 29.6 (L) 30.0 - 36.0 g/dL   RDW 16.1 (H) 09.6 - 04.5 %   Platelets 371 150 - 400 K/uL   nRBC 0.0 0.0 - 0.2 %   Neutrophils Relative % 65 %   Neutro Abs 4.0 1.7 - 7.7 K/uL   Lymphocytes Relative 19 %   Lymphs Abs 1.1 0.7 - 4.0 K/uL   Monocytes Relative 12 %   Monocytes Absolute 0.7 0.1 - 1.0 K/uL   Eosinophils Relative 3 %   Eosinophils Absolute 0.2 0.0 - 0.5 K/uL   Basophils Relative 1 %   Basophils Absolute 0.0 0.0 - 0.1 K/uL   Immature Granulocytes 0 %   Abs Immature Granulocytes 0.02 0.00 - 0.07 K/uL    Comment: Performed at [facility], [address], [place], [place] [address]  Comprehensive metabolic panel     Status: Abnormal   Collection Time: 04/01/23 12:15 PM  Result Value Ref Range   Sodium 143 135 - 145 mmol/L   Potassium 3.5 3.5 - 5.1 mmol/L   Chloride 111 98 - 111 mmol/L   CO2 25 22 - 32 mmol/L   Glucose, Bld 98 70 - 99 mg/dL    Comment: Glucose reference range applies only to samples taken after fasting for at least 8 hours.   BUN 17 8 - 23 mg/dL   Creatinine, Ser 1.91 (H) 0.44 - 1.00 mg/dL   Calcium 8.5 (L) 8.9 - 10.3 mg/dL   Total Protein 6.6 6.5 - 8.1 g/dL   Albumin 3.5 3.5 - 5.0 g/dL   AST 16 15 - 41 U/L   ALT 10 0 - 44 U/L   Alkaline Phosphatase 85 38 - 126 U/L   Total Bilirubin 1.0 0.3 - 1.2 mg/dL   GFR, Estimated 45 (L) >60 mL/min    Comment: (NOTE) Calculated using the CKD-EPI Creatinine Equation (2021)    Anion gap 7 5 - 15    Comment: Performed at [facility], [address], [place], [place] [address]  Type and screen [facility]     Status: None   Collection Time: 04/01/23 12:15 PM  Result Value Ref Range   ABO/RH(D) A POS    Antibody Screen NEG     Sample Expiration 04/04/2023,2359    Unit Number [id number]    Blood Component Type RED CELLS,LR    Unit division 00  Status of Unit ISSUED,FINAL    Donor AG Type NEGATIVE FOR E ANTIGEN NEGATIVE FOR KELL ANTIGEN    Transfusion Status OK TO TRANSFUSE    Crossmatch Result COMPATIBLE    Unit Number [id number]    Blood Component Type RED CELLS,LR    Unit division 00    Status of Unit ISSUED,FINAL    Donor AG Type NEGATIVE FOR E ANTIGEN NEGATIVE FOR KELL ANTIGEN    Transfusion Status OK TO TRANSFUSE    Crossmatch Result COMPATIBLE   Occult blood card to lab, stool     Status: Abnormal   Collection Time: 04/01/23 12:15 PM  Result Value Ref Range   Fecal Occult Bld POSITIVE (A) NEGATIVE    Comment: Performed at [facility], [address] [place], [place] [address]  Prepare RBC     Status: None   Collection Time: 04/01/23  1:09 PM  Result Value Ref Range   Order Confirmation      ORDER PROCESSED BY BLOOD BANK Performed at [facility], [address], [place], [place] [address]   CBC with Differential/Platelet     Status: Abnormal   Collection Time: 04/02/23  6:22 AM  Result Value Ref Range   WBC 6.8 4.0 - 10.5 K/uL   RBC 2.90 (L) 3.87 - 5.11 MIL/uL   Hemoglobin 7.7 (L) 12.0 - 15.0 g/dL    Comment: REPEATED TO VERIFY   HCT 24.3 (L) 36.0 - 46.0 %   MCV 83.8 80.0 - 100.0 fL   MCH 26.6 26.0 - 34.0 pg   MCHC 31.7 30.0 - 36.0 g/dL   RDW 66.0 (H) 63.0 - 16.0 %   Platelets 353 150 - 400 K/uL   nRBC 0.0 0.0 - 0.2 %   Neutrophils Relative % 74 %   Neutro Abs 5.0 1.7 - 7.7 K/uL   Lymphocytes Relative 14 %   Lymphs Abs 0.9 0.7 - 4.0 K/uL   Monocytes Relative 9 %   Monocytes Absolute 0.6 0.1 - 1.0 K/uL   Eosinophils Relative 3 %   Eosinophils Absolute 0.2 0.0 - 0.5 K/uL   Basophils Relative 0 %   Basophils Absolute 0.0 0.0 - 0.1 K/uL   Immature Granulocytes 0 %   Abs Immature Granulocytes 0.02 0.00 - 0.07 K/uL    Comment: Performed at  [facility], [address] [place], [place] [address]  Comprehensive metabolic panel     Status: Abnormal   Collection Time: 04/02/23  6:22 AM  Result Value Ref Range   Sodium 141 135 - 145 mmol/L   Potassium 3.5 3.5 - 5.1 mmol/L   Chloride 108 98 - 111 mmol/L   CO2 26 22 - 32 mmol/L   Glucose, Bld 94 70 - 99 mg/dL    Comment: Glucose reference range applies only to samples taken after fasting for at least 8 hours.   BUN 15 8 - 23 mg/dL   Creatinine, Ser 3.55 (H) 0.44 - 1.00 mg/dL   Calcium 8.5 (L) 8.9 - 10.3 mg/dL   Total Protein 6.4 (L) 6.5 - 8.1 g/dL   Albumin 3.4 (L) 3.5 - 5.0 g/dL   AST 18 15 - 41 U/L   ALT 9 0 - 44 U/L   Alkaline Phosphatase 91 38 - 126 U/L   Total Bilirubin 1.5 (H) 0.3 - 1.2 mg/dL   GFR, Estimated 43 (L) >60 mL/min    Comment: (NOTE) Calculated using the CKD-EPI Creatinine Equation (2021)    Anion  gap 7 5 - 15    Comment: Performed at [facility], [address] [place], [place] [address]  Protime-INR     Status: None   Collection Time: 04/02/23  6:22 AM  Result Value Ref Range   Prothrombin Time 13.9 11.4 - 15.2 seconds   INR 1.1 0.8 - 1.2    Comment: (NOTE) INR goal varies based on device and disease states. Performed at [facility], [address], [place], [place] [address]    DG Lumbar Spine Complete  Result Date: 04/01/2023 CLINICAL DATA:  Chronic dark stools and back pain. EXAM: LUMBAR SPINE - COMPLETE 4+ VIEW COMPARISON:  01/29/2021 FINDINGS: There is transitional anatomy with sacralization of the L5 vertebral body. For the purpose of this report, the lowest formed disc space is designated L5-S1. No evidence of acute fracture or traumatic listhesis. Multilevel mild spondylosis and facet arthropathy. Aortoiliac endovascular stents and left femoral stent. Embolization coils right hemipelvis. Cholecystectomy. IMPRESSION: No evidence of acute fracture or traumatic listhesis.  Electronically Signed   By: Minerva Fester M.D.   On: 04/01/2023 19:49   DG Chest Port 1 View  Result Date: 04/01/2023 CLINICAL DATA:  Shortness of breath EXAM: PORTABLE CHEST 1 VIEW COMPARISON:  X-ray 05/12/2022. CT angiogram abdomen pelvis 07/20/2022. FINDINGS: Normal cardiopericardial silhouette. No consolidation, pneumothorax or effusion. No edema. Overlapping cardiac leads. Fixation hardware along the lower cervical spine at the edge of the imaging field. There is some enlargement of the central pulmonary arteries. IMPRESSION: No acute cardiopulmonary disease. Electronically Signed   By: Karen Kays M.D.   On: 04/01/2023 15:54    Pending Labs Unresulted Labs (From admission, onward)     Start     Ordered   04/02/23 0500  CBC with Differential/Platelet  Daily,   R      04/01/23 1540   04/02/23 0500  Comprehensive metabolic panel  Daily,   R      04/01/23 1540   04/02/23 0500  Protime-INR  Daily,   R      04/01/23 1540   Signed and Held  Comprehensive metabolic panel  Tomorrow morning,   R        Signed and Held   Signed and Held  CBC  Tomorrow morning,   R        Signed and Held            Vitals/Pain Today's Vitals   04/02/23 0800 04/02/23 0900 04/02/23 1000 04/02/23 1036  BP: 114/61 (!) 130/58 128/68 126/66  Pulse: 78 66 78 70  Resp: 15 14 20 12   Temp:    98.2 F (36.8 C)  TempSrc:    Oral  SpO2: 100% 99% 100% 100%  Weight:      Height:      PainSc:        Isolation Precautions No active isolations  Medications Medications  0.9 %  sodium chloride infusion (Manually program via Guardrails IV Fluids) (has no administration in time range)  pantoprazole (PROTONIX) injection 40 mg (40 mg Intravenous Given 04/02/23 0929)  pantoprazole (PROTONIX) injection 40 mg (40 mg Intravenous Given 04/01/23 1405)    Mobility walks     Focused Assessments Gastro- positive occult, low hgb   R Recommendations: See Admitting Provider Note  Report given to:    Additional Notes: aox4, 2 s.l. IVs, ambulatory, RA

## 2023-04-02 NOTE — ED Notes (Signed)
This RN offered Pt a hospital bed while awaiting a room upstairs because she continued top state how the stretcher was so uncomfortable for her but Pt refused the bed. Will continue to try and provide comfort measures.

## 2023-04-02 NOTE — Progress Notes (Signed)
PROGRESS NOTE    Lacey Vega  ZOX:[id number] DOB: 22-Oct-1950 DOA: 04/01/2023 PCP: Gwenyth Bender, MD   Brief Narrative: 72 year old female was sent to the hospital from cancer center due to abnormal labs hemoglobin 5.4 at the cancer infusion center.  Patient has chronic black stools on aspirin and Plavix with history of peptic ulcer disease 2 diabetes hypertension hyperlipidemia AAA chronic back pain hypothyroidism.  Admitted with anemia generalized weakness and fatigue worse over the last 48 hours.  Previous endoscopy in June 2024 with angiodysplastic lesions treated with APC.  Patient admitted for enteroscopy.  Holding aspirin and Plavix received 2 units of blood transfusion with improvement in hemoglobin to 7.7 from 5.3 on admission. Followed by Dr. 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  Status post 2 unit of packed RBCs with improvement in hemoglobin.  Will recheck H&H later today.  Enteroscopy tomorrow.  Appreciate GI.   PAD- Patient has history of PAD with endovascular stent repair by Dr. Lenell Antu and has been on Plavix for that. She is also on 81 mg aspirin per cardiology.   Hypothyroidism  Continue Synthroid   Chronic back pain. Etiology not clear. In February CT scan did not show any acute musculoskeletal abnormality. Will check lumbar spine x-ray - no fractures   AKI (acute kidney injury) (HCC) Baseline serum creatinine 0.8. On  admission serum creatinine 1.3.  Creatinine still elevated at 1.30.  Will check bladder scan to make sure she is not retaining.   HLD (hyperlipidemia) Continue statin.   Sickle cell trait (HCC) Noted.       Estimated body mass index is 28.04 kg/m as calculated from the following:   Height as of this encounter: 5\' 7"  (1.702 m).   Weight as of this encounter: 81.2 kg.  DVT prophylaxis:scd Code Status:full Family Communication:none  Disposition Plan:  Status is: Inpatient Remains inpatient appropriate because: gi bleed   Consultants:  gi  Procedures:none Antimicrobials: none  Subjective:  Complaining of lower back pain being in the bed complains of productive yellow thick phlegm and cough no shortness of breath chest x-ray on admission no acute findings  Objective: Vitals:   04/02/23 0800 04/02/23 0900 04/02/23 1000 04/02/23 1036  BP: 114/61 (!) 130/58 128/68 126/66  Pulse: 78 66 78 70  Resp: 15 14 20 12   Temp:    98.2 F (36.8 C)  TempSrc:    Oral  SpO2: 100% 99% 100% 100%  Weight:      Height:        Intake/Output Summary (Last 24 hours) at 04/02/2023 1134 Last data filed at 04/01/2023 2100 Gross per 24 hour  Intake 1058.5 ml  Output --  Net 1058.5 ml   Filed Weights   04/01/23 1129  Weight: 81.2 kg    Examination:  General exam: Appears in nad  Respiratory system: Clear to auscultation. Respiratory effort normal. Cardiovascular system: S1 & S2 heard, RRR. No JVD, murmurs, rubs,  gallops or clicks. No pedal edema. Gastrointestinal system: Abdomen is nondistended, soft and nontender. No organomegaly or masses felt. Normal bowel sounds heard. Central nervous system: Alert and oriented. No focal neurological deficits. Extremities: trace edema   Data Reviewed: I have personally reviewed following labs and imaging studies  CBC: Recent Labs  Lab 04/01/23 0925 04/01/23 1215 04/02/23 0622  WBC 5.6 6.1 6.8  NEUTROABS 3.5 4.0 5.0  HGB 5.4* 5.3* 7.7*   HCT 17.8* 17.9* 24.3*  MCV 84.4 85.6 83.8  PLT 340 371 353   Basic Metabolic Panel: Recent Labs  Lab 04/01/23 0925 04/01/23 1215 04/02/23 0622  NA 144 143 141  K 3.5 3.5 3.5  CL 109 111 108  CO2 29 25 26   GLUCOSE 104* 98 94  BUN 18 17 15   CREATININE 1.39* 1.26* 1.30*  CALCIUM 8.4* 8.5* 8.5*   GFR: Estimated Creatinine Clearance: 42.2 mL/min (A) (by C-G formula based on SCr of 1.3 mg/dL (H)). Liver Function Tests: Recent Labs  Lab 04/01/23 0925 04/01/23 1215 04/02/23 0622  AST 11* 16 18  ALT 6 10 9   ALKPHOS 82 85 91  BILITOT 0.6 1.0 1.5*  PROT 6.4* 6.6 6.4*  ALBUMIN 3.7 3.5 3.4*   No results for input(s): "LIPASE", "AMYLASE" in the last 168 hours. No results for input(s): "AMMONIA" in the last 168 hours. Coagulation Profile: Recent Labs  Lab 04/02/23 0622  INR 1.1   Cardiac Enzymes: No results for input(s): "CKTOTAL", "CKMB", "CKMBINDEX", "TROPONINI" in the last 168 hours. BNP (last 3 results) No results for input(s): "PROBNP" in the last 8760 hours. HbA1C: No results for input(s): "HGBA1C" in the last 72 hours. CBG: No results for input(s): "GLUCAP" in the last 168 hours. Lipid Profile: No results for input(s): "CHOL", "HDL", "LDLCALC", "TRIG", "CHOLHDL", "LDLDIRECT" in the last 72 hours. Thyroid Function Tests: No results for input(s): "TSH", "T4TOTAL", "FREET4", "T3FREE", "THYROIDAB" in the last 72 hours. Anemia Panel: Recent Labs    04/01/23 0924 04/01/23 0925  FERRITIN  --  15  TIBC  --  451*  IRON  --  13*  RETICCTPCT 4.9*  --    Sepsis Labs: No results for input(s): "PROCALCITON", "LATICACIDVEN" in the last 168 hours.  No results found for this or any previous visit (from the past 240 hour(s)).       Radiology Studies: DG Lumbar Spine Complete  Result Date: 04/01/2023 CLINICAL DATA:  Chronic dark stools and back pain. EXAM: LUMBAR SPINE - COMPLETE 4+ VIEW COMPARISON:  01/29/2021 FINDINGS: There is transitional anatomy with  sacralization of the L5 vertebral body. For the purpose of this report, the lowest formed disc space is designated L5-S1. No evidence of acute fracture or traumatic listhesis. Multilevel mild spondylosis and facet arthropathy. Aortoiliac endovascular stents and left femoral stent. Embolization coils right hemipelvis. Cholecystectomy. IMPRESSION: No evidence of acute fracture or traumatic listhesis. Electronically Signed   By: Minerva Fester M.D.   On: 04/01/2023 19:49   DG Chest Port 1 View  Result Date: 04/01/2023 CLINICAL DATA:  Shortness of breath EXAM: PORTABLE CHEST 1 VIEW COMPARISON:  X-ray 05/12/2022. CT angiogram abdomen pelvis 07/20/2022. FINDINGS: Normal cardiopericardial silhouette. No consolidation, pneumothorax or effusion. No edema. Overlapping cardiac leads. Fixation hardware along the lower cervical spine at the edge of the imaging field. There is some enlargement of the central pulmonary arteries. IMPRESSION: No acute cardiopulmonary disease. Electronically Signed   By: Karen Kays M.D.   On: 04/01/2023 15:54  Scheduled Meds:  sodium chloride   Intravenous Once   pantoprazole (PROTONIX) IV  40 mg Intravenous Q12H   Continuous Infusions:   LOS: 1 day    Time spent: 39 min  Alwyn Ren, MD  04/02/2023, 11:34 AM

## 2023-04-03 ENCOUNTER — Inpatient Hospital Stay (HOSPITAL_COMMUNITY): Payer: Medicare HMO | Admitting: Anesthesiology

## 2023-04-03 ENCOUNTER — Encounter (HOSPITAL_COMMUNITY): Payer: Self-pay | Admitting: Internal Medicine

## 2023-04-03 ENCOUNTER — Encounter (HOSPITAL_COMMUNITY)
Admission: EM | Disposition: A | Discharge status: Home / Self Care | Payer: Self-pay | Source: Home / Self Care | Attending: Internal Medicine

## 2023-04-03 ENCOUNTER — Inpatient Hospital Stay (HOSPITAL_COMMUNITY): Payer: Medicare HMO

## 2023-04-03 DIAGNOSIS — J449 Chronic obstructive pulmonary disease, unspecified: Secondary | ICD-10-CM | POA: Diagnosis not present

## 2023-04-03 DIAGNOSIS — K31819 Angiodysplasia of stomach and duodenum without bleeding: Secondary | ICD-10-CM | POA: Diagnosis not present

## 2023-04-03 DIAGNOSIS — K921 Melena: Secondary | ICD-10-CM | POA: Diagnosis not present

## 2023-04-03 DIAGNOSIS — D500 Iron deficiency anemia secondary to blood loss (chronic): Secondary | ICD-10-CM | POA: Diagnosis not present

## 2023-04-03 HISTORY — PX: ENTEROSCOPY: SHX5533

## 2023-04-03 HISTORY — PX: HOT HEMOSTASIS: SHX5433

## 2023-04-03 LAB — CBC WITH DIFFERENTIAL/PLATELET
Abs Immature Granulocytes: 0.02 10*3/uL (ref 0.00–0.07)
Basophils Absolute: 0 10*3/uL (ref 0.0–0.1)
Basophils Relative: 0 %
Eosinophils Absolute: 0.3 10*3/uL (ref 0.0–0.5)
Eosinophils Relative: 4 %
HCT: 27.4 % — ABNORMAL LOW (ref 36.0–46.0)
Hemoglobin: 8.2 g/dL — ABNORMAL LOW (ref 12.0–15.0)
Immature Granulocytes: 0 %
Lymphocytes Relative: 18 %
Lymphs Abs: 1.1 10*3/uL (ref 0.7–4.0)
MCH: 25.7 pg — ABNORMAL LOW (ref 26.0–34.0)
MCHC: 29.9 g/dL — ABNORMAL LOW (ref 30.0–36.0)
MCV: 85.9 fL (ref 80.0–100.0)
Monocytes Absolute: 0.7 10*3/uL (ref 0.1–1.0)
Monocytes Relative: 11 %
Neutro Abs: 4.2 10*3/uL (ref 1.7–7.7)
Neutrophils Relative %: 67 %
Platelets: 387 10*3/uL (ref 150–400)
RBC: 3.19 MIL/uL — ABNORMAL LOW (ref 3.87–5.11)
RDW: 15.9 % — ABNORMAL HIGH (ref 11.5–15.5)
WBC: 6.3 10*3/uL (ref 4.0–10.5)
nRBC: 0 % (ref 0.0–0.2)

## 2023-04-03 LAB — COMPREHENSIVE METABOLIC PANEL
ALT: 10 U/L (ref 0–44)
AST: 17 U/L (ref 15–41)
Albumin: 3.5 g/dL (ref 3.5–5.0)
Alkaline Phosphatase: 90 U/L (ref 38–126)
Anion gap: 9 (ref 5–15)
BUN: 13 mg/dL (ref 8–23)
CO2: 28 mmol/L (ref 22–32)
Calcium: 8.8 mg/dL — ABNORMAL LOW (ref 8.9–10.3)
Chloride: 104 mmol/L (ref 98–111)
Creatinine, Ser: 1.32 mg/dL — ABNORMAL HIGH (ref 0.44–1.00)
GFR, Estimated: 43 mL/min — ABNORMAL LOW (ref >60)
Glucose, Bld: 103 mg/dL — ABNORMAL HIGH (ref 70–99)
Potassium: 3.3 mmol/L — ABNORMAL LOW (ref 3.5–5.1)
Sodium: 141 mmol/L (ref 135–145)
Total Bilirubin: 1.5 mg/dL — ABNORMAL HIGH (ref 0.3–1.2)
Total Protein: 6.6 g/dL (ref 6.5–8.1)

## 2023-04-03 LAB — HEMOGLOBIN AND HEMATOCRIT, BLOOD
HCT: 26.2 % — ABNORMAL LOW (ref 36.0–46.0)
HCT: 26.6 % — ABNORMAL LOW (ref 36.0–46.0)
Hemoglobin: 8 g/dL — ABNORMAL LOW (ref 12.0–15.0)
Hemoglobin: 8 g/dL — ABNORMAL LOW (ref 12.0–15.0)

## 2023-04-03 LAB — MAGNESIUM: Magnesium: 2.3 mg/dL (ref 1.7–2.4)

## 2023-04-03 LAB — PROTIME-INR
INR: 1 (ref 0.8–1.2)
Prothrombin Time: 13.9 s (ref 11.4–15.2)

## 2023-04-03 SURGERY — ENTEROSCOPY
Anesthesia: General

## 2023-04-03 MED ORDER — PROPOFOL 500 MG/50ML IV EMUL
INTRAVENOUS | Status: DC | PRN
  Administered 2023-04-03: 125 ug/kg/min via INTRAVENOUS

## 2023-04-03 MED ORDER — ALUM & MAG HYDROXIDE-SIMETH 200-200-20 MG/5ML PO SUSP: 30.0000 mL | ORAL | Status: DC | PRN

## 2023-04-03 MED ORDER — ALBUTEROL SULFATE (2.5 MG/3ML) 0.083% IN NEBU
2.5000 mg | INHALATION_SOLUTION | Freq: Two times a day (BID) | RESPIRATORY_TRACT | Status: DC
  Filled 2023-04-03: qty 3

## 2023-04-03 MED ORDER — POTASSIUM CHLORIDE CRYS ER 20 MEQ PO TBCR
40.0000 meq | EXTENDED_RELEASE_TABLET | Freq: Once | ORAL | Status: AC
  Administered 2023-04-03: 40 meq via ORAL
  Filled 2023-04-03: qty 2

## 2023-04-03 MED ORDER — PROPOFOL 10 MG/ML IV BOLUS
INTRAVENOUS | Status: DC | PRN
  Administered 2023-04-03 (×2): 40 mg via INTRAVENOUS

## 2023-04-03 MED ORDER — ALBUTEROL SULFATE (2.5 MG/3ML) 0.083% IN NEBU
2.5000 mg | INHALATION_SOLUTION | Freq: Four times a day (QID) | RESPIRATORY_TRACT | Status: DC
  Administered 2023-04-03 (×3): 2.5 mg via RESPIRATORY_TRACT
  Filled 2023-04-03 (×3): qty 3

## 2023-04-03 MED ORDER — MENTHOL 3 MG MT LOZG
1.0000 | LOZENGE | OROMUCOSAL | Status: DC | PRN
  Administered 2023-04-03: 3 mg via ORAL
  Filled 2023-04-03: qty 9

## 2023-04-03 MED ORDER — PROPOFOL 500 MG/50ML IV EMUL
INTRAVENOUS | Status: AC
  Filled 2023-04-03: qty 50

## 2023-04-03 MED ORDER — ALBUTEROL SULFATE (2.5 MG/3ML) 0.083% IN NEBU: 2.5000 mg | INHALATION_SOLUTION | RESPIRATORY_TRACT | Status: DC | PRN

## 2023-04-03 NOTE — Anesthesia Postprocedure Evaluation (Signed)
Anesthesia Post Note  Patient: Lacey Vega  Procedure(s) Performed: ENTEROSCOPY HOT HEMOSTASIS (ARGON PLASMA COAGULATION/BICAP)     Patient location during evaluation: Endoscopy Anesthesia Type: General Level of consciousness: awake and alert Pain management: pain level controlled Vital Signs Assessment: post-procedure vital signs reviewed and stable Respiratory status: spontaneous breathing, nonlabored ventilation, respiratory function stable and patient connected to nasal cannula oxygen Cardiovascular status: blood pressure returned to baseline and stable Postop Assessment: no apparent nausea or vomiting Anesthetic complications: no   No notable events documented.  Last Vitals:  Vitals:   04/03/23 1330 04/03/23 1352  BP: (!) 94/47 (!) 124/59  Pulse: 75 66  Resp: 17 18  Temp:  36.7 C  SpO2: 94% 99%    Last Pain:  Vitals:   04/03/23 1352  TempSrc: Oral  PainSc: 0-No pain                 Trevor Iha

## 2023-04-03 NOTE — Op Note (Signed)
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 Estimated Blood Loss:     Estimated blood loss: none. Procedure:                Pre-Anesthesia Assessment:                           - Prior to the procedure, a History and Physical                            was performed, and patient medications and                            allergies were reviewed. The patient's tolerance of                            previous anesthesia was also reviewed. The risks                            and benefits of the procedure and the sedation                            options and risks were discussed with the patient.                            All questions were answered, and informed consent                            was obtained. Prior Anticoagulants: The patient  last took Plavix (clopidogrel) 2 days prior to the                            procedure. ASA Grade Assessment: III - A patient                            with severe systemic disease. After reviewing the                            risks and benefits, the patient was deemed in                            satisfactory condition to undergo the procedure.                            After obtaining informed consent, the endoscope was                            passed under direct vision. Throughout the                            procedure, the patient's blood pressure, pulse, and                            oxygen saturations were monitored continuously. The                            [id number]-HQ190L ([id number]) Olympus colonoscope was                            introduced through the mouth and advanced to the                            proximal jejunum. The small bowel enteroscopy was                            accomplished without difficulty. The patient                            tolerated the procedure well. Scope In: Scope Out: Findings:      A single angiodysplastic lesion with no bleeding was found in the fourth       portion of the duodenum. Coagulation for bleeding prevention using argon       plasma was successful. Estimated blood loss: none.      There was no evidence of significant pathology in the proximal jejunum.      The esophagus was normal.      The stomach was normal.      Remainder of exam including gastric retroflexion was normal. Impression:               - A single non-bleeding angiodysplastic lesion in                            the duodenum. Treated with argon plasma coagulation                            (  APC).                           - The examined portion of the jejunum was normal.                           - Normal esophagus.                           - Normal stomach.                           - No specimens collected.                           Most recent prior endoscopic studies                           11/07/2022 Small bowel endoscopy with Dr. Chales Abrahams for                            melena and anemia                           Revealed 4 nonbleeding angiodysplastic lesions in                            the duodenum. Treated with APC. 2 nonbleeding                            angiodysplastic lesions in the duodenum also                             treated with APC.                           Colonoscopy 11/27/21:                           - The examined portion of the ileum was normal.                           - Three 3 to 4 mm polyps in the transverse colon                            and in the ascending colon, removed with a cold                            snare. Resected and retrieved.                           - A single non-bleeding colonic angioectasia.                            Treated with argon plasma coagulation (APC).                           -  Diverticulosis in the sigmoid colon, in the                            descending colon, in the transverse colon and in                            the ascending colon.                           - Non-bleeding internal hemorrhoids.                           - Anal wart.                           Path:                           A. TRANSVERSE COLON, POLYPECTOMY:                           Benign colonic mucosa with lymphoid aggregate                           B. ASCENDING COLON, POLYPECTOMY:                           Tubular adenoma                           Negative for high-grade dysplasia and carcinoma                           VCE 07/07/21:                           - Normal esophagus.                           - A single non-bleeding angiodysplastic lesion in                            the stomach. Treated with argon plasma                           coagulation (APC).                           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 Treated with argon plasma                           coagulation (APC).                           - The examined portion of the jejunum was normal.                           -  No specimens collected. Recommendation:           - reg diet                           home today                           f/u with hematology as planned - she did receive                            parenteral iron  (Venofer) x 1 here                           I will message vascular surgery re: could she stop                            plavix and use ASA for prophylaxis s/p endovascular                            repairs, and will cc primary GI and hematologist                           daily PPI                           resume Plavix in 3 days                           depending upon clinical course consider repeat                            colonoscopy Procedure Code(s):        --- Professional ---                           [phone], Small intestinal endoscopy, enteroscopy                            beyond second portion of duodenum, not including                            ileum; with control of bleeding (eg, injection,                            bipolar cautery, unipolar cautery, laser, heater                            probe, stapler, plasma coagulator) Diagnosis Code(s):        --- Professional ---                           U04.540, Angiodysplasia of stomach and duodenum                            without bleeding  K92.1, Melena (includes Hematochezia)                           K55.20, Angiodysplasia of colon without hemorrhage CPT copyright 2022 [employer]. All rights reserved. The codes documented in this report are preliminary and upon coder review may  be revised to meet current compliance requirements. Iva Boop, MD 04/03/2023 1:22:46 PM This report has been signed electronically. Number of Addenda: 0

## 2023-04-03 NOTE — Transfer of Care (Signed)
Immediate Anesthesia Transfer of Care Note  Patient: Lacey Vega  Procedure(s) Performed: ENTEROSCOPY HOT HEMOSTASIS (ARGON PLASMA COAGULATION/BICAP)  Patient Location: Endoscopy Unit  Anesthesia Type:MAC  Level of Consciousness: awake and patient cooperative  Airway & Oxygen Therapy: Patient Spontanous Breathing and Patient connected to nasal cannula oxygen  Post-op Assessment: Report given to RN and Post -op Vital signs reviewed and stable  Post vital signs: Reviewed and stable  Last Vitals:  Vitals Value Taken Time  BP    Temp    Pulse 60 04/03/23 1312  Resp 14 04/03/23 1312  SpO2 100 % 04/03/23 1312  Vitals shown include unfiled device data.  Last Pain:  Vitals:   04/03/23 1145  TempSrc: Temporal  PainSc: 0-No pain         Complications: No notable events documented.

## 2023-04-03 NOTE — Interval H&P Note (Signed)
History and Physical Interval Note:  04/03/2023 12:32 PM  Lacey Vega  has presented today for surgery, with the diagnosis of melena, symptomatic anemia.  The various methods of treatment have been discussed with the patient and family. After consideration of risks, benefits and other options for treatment, the patient has consented to  Procedure(s): ENTEROSCOPY (N/A) as a surgical intervention.  The patient's history has been reviewed, patient examined, no change in status, stable for surgery.  I have reviewed the patient's chart and labs.  Questions were answered to the patient's satisfaction.     Stan Head

## 2023-04-03 NOTE — Progress Notes (Signed)
PROGRESS NOTE    Lacey Vega  ZOX:[id number] DOB: [birth year] DOA: 04/01/2023 PCP: Gwenyth Bender, MD   Brief Narrative: 72 year old female was sent to the hospital from cancer center due to abnormal labs hemoglobin 5.4 at the cancer infusion center.  Patient has chronic black stools on aspirin and Plavix with history of peptic ulcer disease 2 diabetes hypertension hyperlipidemia AAA chronic back pain hypothyroidism.  Admitted with anemia generalized weakness and fatigue worse over the last 48 hours.  Previous endoscopy in June 2024 with angiodysplastic lesions treated with APC.  Patient admitted for enteroscopy.  Holding aspirin and Plavix received 2 units of blood transfusion with improvement in hemoglobin to 7.7 from 5.3 on admission. Followed by Dr. Leonides Schanz for iron deficiency anemia She complains of a cough with productive thick yellow phlegm  Assessment & Plan:   Principal Problem:   GI bleed Active Problems:   Hypothyroidism (acquired)   AAA (abdominal aortic aneurysm)   Acute on chronic blood loss anemia   AKI (acute kidney injury) (HCC)   PAD (peripheral artery disease) (HCC)   HLD (hyperlipidemia)   Sickle cell trait (HCC)   Iron deficiency anemia due to chronic blood loss   Melena   Symptomatic anemia   Angiodysplasia of stomach and duodenum   Acute on chronic GI bleed/iron deficiency anemia-in the setting of angiodysplastic lesions in the duodenum, aspirin and Plavix on hold.  Status post 1 unit of Venofer and 2 unit of packed RBCs with improvement in hemoglobin.  Hemoglobin 8.2 from 5.3 on admit.   She had a small bowel enteroscopy on 04/03/2023-a single nonbleeding AVM in the duodenum treated with APC.  Normal stomach esophagus and examined portion of the jejunum.  No specimens were collected.  Her prior small bowel endoscopy was on 11/07/2022 when she had full-blown bleeding AVMs that were treated with APC.  Productive cough with no hypoxia no evidence of infiltrates  by chest x-ray on admit.  Will start her on nebs and repeat chest x-ray.   PAD- Patient has history of PAD with endovascular stent repair by Dr. Lenell Antu and has been on Plavix for that. She is also on 81 mg aspirin per cardiology.   Hypothyroidism  Continue Synthroid   Chronic back pain. In February CT scan did not show any acute musculoskeletal abnormality.  lumbar spine x-ray - no fractures   AKI (acute kidney injury) (HCC) Baseline serum creatinine 0.8. On admission serum creatinine 1.3.  Creatinine still elevated at 1.30.  Will check bladder scan to make sure she is not retaining.   HLD (hyperlipidemia) Continue statin.   Sickle cell trait (HCC) Noted.       Estimated body mass index is 28.04 kg/m as calculated from the following:   Height as of this encounter: 5\' 7"  (1.702 m).   Weight as of this encounter: 81.2 kg.  DVT prophylaxis:scd Code Status:full Family Communication:none  Disposition Plan:  Status is: Inpatient Remains inpatient appropriate because: gi bleed   Consultants:  gi  Procedures:none Antimicrobials: none  Subjective:  Complains of throat itching and cough with thick whitish to yellow phlegm chest x-ray on admission did not reveal any acute evidence of infiltrates or effusions. Objective: Vitals:   04/03/23 1311 04/03/23 1320 04/03/23 1327 04/03/23 1330  BP: (!) 80/36 (!) 98/44 (!) 101/50 (!) 94/47  Pulse: 65 72 72 75  Resp: 17 20 (!) 22 17  Temp:      TempSrc:      SpO2: 100% 93% 95%  94%  Weight:      Height:        Intake/Output Summary (Last 24 hours) at 04/03/2023 1352 Last data filed at 04/03/2023 0300 Gross per 24 hour  Intake 253.44 ml  Output --  Net 253.44 ml   Filed Weights   04/01/23 1129 04/03/23 1145  Weight: 81.2 kg 81.2 kg    Examination:  General exam: Appears in nad  Respiratory system: Wheezing today compared to yesterday to auscultation. Respiratory effort normal. Cardiovascular system: S1 & S2 heard,  RRR. No JVD, murmurs, rubs, gallops or clicks. No pedal edema. Gastrointestinal system: Abdomen is nondistended, soft and nontender. No organomegaly or masses felt. Normal bowel sounds heard. Central nervous system: Alert and oriented. No focal neurological deficits. Extremities: trace edema   Data Reviewed: I have personally reviewed following labs and imaging studies  CBC: Recent Labs  Lab 04/01/23 0925 04/01/23 1215 04/02/23 0622 04/02/23 1356 04/02/23 1950 04/03/23 0533  WBC 5.6 6.1 6.8 6.7  --  6.3  NEUTROABS 3.5 4.0 5.0  --   --  4.2  HGB 5.4* 5.3* 7.7* 8.2* 8.4* 8.2*  HCT 17.8* 17.9* 24.3* 26.9* 27.2* 27.4*  MCV 84.4 85.6 83.8 85.9  --  85.9  PLT 340 371 353 388  --  387   Basic Metabolic Panel: Recent Labs  Lab 04/01/23 0925 04/01/23 1215 04/02/23 0622 04/02/23 1356 04/03/23 0533  NA 144 143 141 141 141  K 3.5 3.5 3.5 3.5 3.3*  CL 109 111 108 107 104  CO2 29 25 26 28 28   GLUCOSE 104* 98 94 109* 103*  BUN 18 17 15 15 13   CREATININE 1.39* 1.26* 1.30* 1.34* 1.32*  CALCIUM 8.4* 8.5* 8.5* 8.7* 8.8*   GFR: Estimated Creatinine Clearance: 41.6 mL/min (A) (by C-G formula based on SCr of 1.32 mg/dL (H)). Liver Function Tests: Recent Labs  Lab 04/01/23 0925 04/01/23 1215 04/02/23 0622 04/02/23 1356 04/03/23 0533  AST 11* 16 18 17 17   ALT 6 10 9 10 10   ALKPHOS 82 85 91 96 90  BILITOT 0.6 1.0 1.5* 1.5* 1.5*  PROT 6.4* 6.6 6.4* 7.0 6.6  ALBUMIN 3.7 3.5 3.4* 3.5 3.5   No results for input(s): "LIPASE", "AMYLASE" in the last 168 hours. No results for input(s): "AMMONIA" in the last 168 hours. Coagulation Profile: Recent Labs  Lab 04/02/23 0622 04/03/23 0533  INR 1.1 1.0   Cardiac Enzymes: No results for input(s): "CKTOTAL", "CKMB", "CKMBINDEX", "TROPONINI" in the last 168 hours. BNP (last 3 results) No results for input(s): "PROBNP" in the last 8760 hours. HbA1C: No results for input(s): "HGBA1C" in the last 72 hours. CBG: No results for input(s):  "GLUCAP" in the last 168 hours. Lipid Profile: No results for input(s): "CHOL", "HDL", "LDLCALC", "TRIG", "CHOLHDL", "LDLDIRECT" in the last 72 hours. Thyroid Function Tests: No results for input(s): "TSH", "T4TOTAL", "FREET4", "T3FREE", "THYROIDAB" in the last 72 hours. Anemia Panel: Recent Labs    04/01/23 0924 04/01/23 0925  FERRITIN  --  15  TIBC  --  451*  IRON  --  13*  RETICCTPCT 4.9*  --    Sepsis Labs: No results for input(s): "PROCALCITON", "LATICACIDVEN" in the last 168 hours.  No results found for this or any previous visit (from the past 240 hour(s)).       Radiology Studies: DG Lumbar Spine Complete  Result Date: 04/01/2023 CLINICAL DATA:  Chronic dark stools and back pain. EXAM: LUMBAR SPINE - COMPLETE 4+ VIEW COMPARISON:  01/29/2021 FINDINGS:  There is transitional anatomy with sacralization of the L5 vertebral body. For the purpose of this report, the lowest formed disc space is designated L5-S1. No evidence of acute fracture or traumatic listhesis. Multilevel mild spondylosis and facet arthropathy. Aortoiliac endovascular stents and left femoral stent. Embolization coils right hemipelvis. Cholecystectomy. IMPRESSION: No evidence of acute fracture or traumatic listhesis. Electronically Signed   By: Minerva Fester M.D.   On: 04/01/2023 19:49        Scheduled Meds:  sodium chloride   Intravenous Once   albuterol  2.5 mg Nebulization QID   atorvastatin  40 mg Oral QHS   donepezil  5 mg Oral QHS   furosemide  20 mg Oral Daily   levothyroxine  125 mcg Oral Daily   montelukast  10 mg Oral QPM   pantoprazole (PROTONIX) IV  40 mg Intravenous Q12H   Continuous Infusions:  iron sucrose 300 mg (04/02/23 1827)     LOS: 2 days    Time spent: 39 min  Alwyn Ren, MD  04/03/2023, 1:52 PM

## 2023-04-03 NOTE — Evaluation (Signed)
Physical Therapy Evaluation Patient Details Name: Lacey Vega MRN: [id number] DOB: 05-Aug-1950 Today's Date: 04/03/2023  History of Present Illness  Patient is a 72 y.o. female who presented to the hospital with complaints of back pain, fatigue and tiredness progressively worsening for last 2 to 3 weeks She was having difficulty ambulating. Had a near syncopal event and admitted for management of GI bleed. PMH significant for but not limited to chronic GI bleeding with iron deficiency anemia secondary to angiodysplasia, PAD on Plavix, chronic back pain, hypothyroidism, HLD, diabetes, thyroid disease, HTN, and RTC repair.    Clinical Impression  Pt is a  72 y.o. female with above listed HPI. Pt is independent at baseline and lives with family. Pt modified independent-supervision for all mobility. Ambulated 35ft and additional 92ft to/from stairs. Will have assist from daughter and granddaughter as needed upon d/c. No further skilled PT needs identified. PT will sign off.             Equipment Recommendations None recommended by PT  Recommendations for Other Services       Functional Status Assessment Patient has not had a recent decline in their functional status     Precautions / Restrictions Precautions Precautions: Fall Restrictions Weight Bearing Restrictions: No      Mobility  Bed Mobility Overal bed mobility: Modified Independent                  Transfers Overall transfer level: Modified independent                      Ambulation/Gait Ambulation/Gait assistance: Modified independent (Device/Increase time) Gait Distance (Feet): 50 Feet Assistive device: None Gait Pattern/deviations: Step-through pattern Gait velocity: dreasedec     General Gait Details: no overt LOB observed. Pt denied any feelings of dizziness/lightheadedness and reports no pain.  Stairs Stairs: Yes Stairs assistance: Supervision Stair Management: No rails, Step to  pattern, Forwards Number of Stairs: 4 General stair comments: slow speed and slightly angled side step to descend stairs. Pt reports she usually has her cane for stair negotiatoin.  Wheelchair Mobility     Tilt Bed    Modified Rankin (Stroke Patients Only)       Balance Overall balance assessment: Modified Independent                                           Pertinent Vitals/Pain Pain Assessment Pain Assessment: No/denies pain    Home Living Family/patient expects to be discharged to:: Private residence Living Arrangements: Children;Other relatives Available Help at Discharge: Family Type of Home: House Home Access: Stairs to enter Entrance Stairs-Rails: None Entrance Stairs-Number of Steps: 2 +1   Home Layout: Two level;Able to live on main level with bedroom/bathroom;Full bath on main level Home Equipment: Cane - single point;Rolling Walker (2 wheels);Tub bench;Rollator (4 wheels);Toilet riser;Other (comment) Additional Comments: stays on main level, does not have to negotiate stairs. Daughter lives on other level.    Prior Function Prior Level of Function : Independent/Modified Independent             Mobility Comments: SPC in community. Denies history of falls. ADLs Comments: family provides transportation. Uses tub bench.     Extremity/Trunk Assessment   Upper Extremity Assessment Upper Extremity Assessment: Overall WFL for tasks assessed;Right hand dominant (grossly 4/5 throughout, good bilat grip strength)    Lower  Extremity Assessment  RLE Deficits / Details: grossly 4/5 throughout LLE Deficits / Details: grossly 4/5 throughout    Cervical / Trunk Assessment Cervical / Trunk Assessment: Normal  Communication   Communication Communication: No apparent difficulties  Cognition Arousal: Alert Behavior During Therapy: WFL for tasks assessed/performed Overall Cognitive Status: Within Functional Limits for tasks assessed                                           General Comments      Exercises     Assessment/Plan    PT Assessment Patient does not need any further PT services  PT Problem List         PT Treatment Interventions      PT Goals (Current goals can be found in the Care Plan section)  Acute Rehab PT Goals Patient Stated Goal: Be able to go home soon PT Goal Formulation: With patient Time For Goal Achievement: 04/17/23 Potential to Achieve Goals: Good    Frequency       Co-evaluation               AM-PAC PT "6 Clicks" Mobility  Outcome Measure Help needed turning from your back to your side while in a flat bed without using bedrails?: None Help needed moving from lying on your back to sitting on the side of a flat bed without using bedrails?: None Help needed moving to and from a bed to a chair (including a wheelchair)?: None Help needed standing up from a chair using your arms (e.g., wheelchair or bedside chair)?: None Help needed to walk in hospital room?: None Help needed climbing 3-5 steps with a railing? : A Little 6 Click Score: 23    End of Session   Activity Tolerance: Patient tolerated treatment well Patient left: in bed;with call bell/phone within reach (no bed alarm on pre/post session. Pt reports she has been independent around room.) Nurse Communication: Mobility status PT Visit Diagnosis: Muscle weakness (generalized) (M62.81)    Time: 7371-0626 PT Time Calculation (min) (ACUTE ONLY): 16 min   Charges:   PT Evaluation $PT Eval Low Complexity: 1 Low   PT General Charges $$ ACUTE PT VISIT: 1 Visit         Lyman Speller PT, DPT  Acute Rehabilitation Services  Office [phone]  04/03/2023, 11:25 AM

## 2023-04-03 NOTE — Interval H&P Note (Signed)
History and Physical Interval Note:  04/03/2023 12:35 PM  Lacey Vega  has presented today for surgery, with the diagnosis of melena, symptomatic anemia.  The various methods of treatment have been discussed with the patient and family. After consideration of risks, benefits and other options for treatment, the patient has consented to  Procedure(s): ENTEROSCOPY (N/A) as a surgical intervention.  The patient's history has been reviewed, patient examined, no change in status, stable for surgery.  I have reviewed the patient's chart and labs.  Questions were answered to the patient's satisfaction.     Stan Head

## 2023-04-03 NOTE — Plan of Care (Signed)

## 2023-04-03 NOTE — Evaluation (Signed)
Occupational Therapy Evaluation Patient Details Name: Lacey Vega MRN: [id number] DOB: [birth year] Today's Date: 04/03/2023   History of Present Illness Patient presented to the hospital with complaints of back pain, fatigue and tiredness progressively worsening for last 2 to 3 weeks  She was having difficulty ambulating.  Had a near syncopal eventchronic GI bleeding with iron deficiency anemia secondary to angiodysplasia, PAD on Plavix, chronic back pain, hypothyroidism, HLD, cognitive imbalance   Clinical Impression   Pt received sitting EOB, finishing up with PT eval. At baseline, pt IND with ADLs and mobility. Does not drive. Denies falls, lives with family in multi-level home but stays on one level with daughter living upstairs. No pain reported. Pt with 4/5 bilateral UE strength, R hand dominant and wears glasses. Completes bed mobility MOD I, walks in the room with no AD MOD I, multiple sit<>stand transfers MOD I from EOB / recliner and has been IND in room. Pt feels like she is at her baseline. No acute OT needs at this time, OT will complete orders and sign off.       If plan is discharge home, recommend the following: Assist for transportation    Functional Status Assessment  Patient has not had a recent decline in their functional status  Equipment Recommendations  None recommended by OT       Precautions / Restrictions Precautions Precautions: Fall Restrictions Weight Bearing Restrictions: No      Mobility Bed Mobility Overal bed mobility: Modified Independent                  Transfers Overall transfer level: Modified independent                        Balance Overall balance assessment: Modified Independent                                         ADL either performed or assessed with clinical judgement   ADL Overall ADL's : Modified independent                                       General ADL  Comments: pt at baseline for ADL performance     Vision Baseline Vision/History: 1 Wears glasses       Perception         Praxis         Pertinent Vitals/Pain Pain Assessment Pain Assessment: No/denies pain     Extremity/Trunk Assessment Upper Extremity Assessment Upper Extremity Assessment: Overall WFL for tasks assessed;Right hand dominant (grossly 4/5 throughout, good bilat grip strength)   Lower Extremity Assessment Lower Extremity Assessment: Defer to PT evaluation RLE Deficits / Details: grossly 4/5 throughout LLE Deficits / Details: grossly 4/5 throughout   Cervical / Trunk Assessment Cervical / Trunk Assessment: Normal   Communication Communication Communication: No apparent difficulties   Cognition Arousal: Alert Behavior During Therapy: WFL for tasks assessed/performed Overall Cognitive Status: Within Functional Limits for tasks assessed                                                  Home Living Family/patient expects to  be discharged to:: Private residence Living Arrangements: Children;Other relatives Available Help at Discharge: Family Type of Home: House Home Access: Stairs to enter [employer] of Steps: 2 +1 Entrance Stairs-Rails: None Home Layout: Two level;Able to live on main level with bedroom/bathroom;Full bath on main level     Bathroom Shower/Tub: Producer, television/film/video: Standard Bathroom Accessibility: Yes   Home Equipment: Cane - single Librarian, academic (2 wheels);Tub bench;Rollator (4 wheels);Toilet riser;Other (comment)   Additional Comments: stays on main level, does not have to negotiate stairs. Daughter lives on other level.      Prior Functioning/Environment Prior Level of Function : Independent/Modified Independent             Mobility Comments: SPC in community. Denies history of falls. ADLs Comments: family provides transportation. Uses tub bench.         AM-PAC  OT "6 Clicks" Daily Activity     Outcome Measure Help from another person eating meals?: None Help from another person taking care of personal grooming?: None Help from another person toileting, which includes using toliet, bedpan, or urinal?: None Help from another person bathing (including washing, rinsing, drying)?: None Help from another person to put on and taking off regular upper body clothing?: None Help from another person to put on and taking off regular lower body clothing?: None 6 Click Score: 24   End of Session    Activity Tolerance: Patient tolerated treatment well Patient left: in bed;with call bell/phone within reach                   Time: 1610-9604 OT Time Calculation (min): 15 min Charges:  OT General Charges $OT Visit: 1 Visit OT Evaluation $OT Eval Low Complexity: 1 Low  Aveena Bari L. Muntaha Vermette, OTR/L  04/03/23, 11:22 AM

## 2023-04-03 NOTE — Anesthesia Preprocedure Evaluation (Addendum)
Anesthesia Evaluation    Reviewed: Allergy & Precautions, Patient's Chart, lab work & pertinent test results, Unable to perform ROS - Chart review only  Airway Mallampati: II  TM Distance: >3 FB Neck ROM: Full    Dental  (+) Dental Advisory Given, Edentulous Upper, Edentulous Lower   Pulmonary shortness of breath, asthma , COPD, former smoker   Pulmonary exam normal breath sounds clear to auscultation       Cardiovascular hypertension, Pt. on medications + Peripheral Vascular Disease  Normal cardiovascular exam Rhythm:Regular Rate:Normal     Neuro/Psych  Headaches PSYCHIATRIC DISORDERS Anxiety     TIA   GI/Hepatic Neg liver ROS, hiatal hernia,GERD  ,,  Endo/Other  diabetesHypothyroidism    Renal/GU Renal disease     Musculoskeletal  (+) Arthritis ,    Abdominal   Peds  Hematology  (+) Blood dyscrasia, anemia   Anesthesia Other Findings   Reproductive/Obstetrics                              Anesthesia Physical Anesthesia Plan  ASA: 3  Anesthesia Plan: General   Post-op Pain Management: Minimal or no pain anticipated   Induction: Intravenous  PONV Risk Score and Plan: 3 and Propofol infusion, TIVA and Treatment may vary due to age or medical condition  Airway Management Planned: Natural Airway  Additional Equipment:   Intra-op Plan:   Post-operative Plan:   Informed Consent: I have reviewed the patients History and Physical, chart, labs and discussed the procedure including the risks, benefits and alternatives for the proposed anesthesia with the patient or authorized representative who has indicated his/her understanding and acceptance.     Dental advisory given  Plan Discussed with: CRNA  Anesthesia Plan Comments:          Anesthesia Quick Evaluation

## 2023-04-04 ENCOUNTER — Inpatient Hospital Stay (HOSPITAL_COMMUNITY): Payer: Medicare HMO

## 2023-04-04 DIAGNOSIS — D649 Anemia, unspecified: Secondary | ICD-10-CM | POA: Diagnosis not present

## 2023-04-04 LAB — HEMOGLOBIN AND HEMATOCRIT, BLOOD
HCT: 23.8 % — ABNORMAL LOW (ref 36.0–46.0)
HCT: 25.4 % — ABNORMAL LOW (ref 36.0–46.0)
HCT: 29.6 % — ABNORMAL LOW (ref 36.0–46.0)
Hemoglobin: 7.2 g/dL — ABNORMAL LOW (ref 12.0–15.0)
Hemoglobin: 7.5 g/dL — ABNORMAL LOW (ref 12.0–15.0)
Hemoglobin: 9 g/dL — ABNORMAL LOW (ref 12.0–15.0)

## 2023-04-04 LAB — COMPREHENSIVE METABOLIC PANEL
ALT: 11 U/L (ref 0–44)
AST: 15 U/L (ref 15–41)
Albumin: 3.1 g/dL — ABNORMAL LOW (ref 3.5–5.0)
Alkaline Phosphatase: 77 U/L (ref 38–126)
Anion gap: 7 (ref 5–15)
BUN: 15 mg/dL (ref 8–23)
CO2: 26 mmol/L (ref 22–32)
Calcium: 8.2 mg/dL — ABNORMAL LOW (ref 8.9–10.3)
Chloride: 107 mmol/L (ref 98–111)
Creatinine, Ser: 1.51 mg/dL — ABNORMAL HIGH (ref 0.44–1.00)
GFR, Estimated: 36 mL/min — ABNORMAL LOW (ref >60)
Glucose, Bld: 113 mg/dL — ABNORMAL HIGH (ref 70–99)
Potassium: 3.5 mmol/L (ref 3.5–5.1)
Sodium: 140 mmol/L (ref 135–145)
Total Bilirubin: 0.6 mg/dL (ref 0.3–1.2)
Total Protein: 5.9 g/dL — ABNORMAL LOW (ref 6.5–8.1)

## 2023-04-04 LAB — CBC WITH DIFFERENTIAL/PLATELET
Abs Immature Granulocytes: 0.03 10*3/uL (ref 0.00–0.07)
Basophils Absolute: 0 10*3/uL (ref 0.0–0.1)
Basophils Relative: 0 %
Eosinophils Absolute: 0.2 10*3/uL (ref 0.0–0.5)
Eosinophils Relative: 3 %
HCT: 23.8 % — ABNORMAL LOW (ref 36.0–46.0)
Hemoglobin: 7.2 g/dL — ABNORMAL LOW (ref 12.0–15.0)
Immature Granulocytes: 0 %
Lymphocytes Relative: 12 %
Lymphs Abs: 1 10*3/uL (ref 0.7–4.0)
MCH: 26.2 pg (ref 26.0–34.0)
MCHC: 30.3 g/dL (ref 30.0–36.0)
MCV: 86.5 fL (ref 80.0–100.0)
Monocytes Absolute: 1 10*3/uL (ref 0.1–1.0)
Monocytes Relative: 12 %
Neutro Abs: 6.2 10*3/uL (ref 1.7–7.7)
Neutrophils Relative %: 73 %
Platelets: 342 10*3/uL (ref 150–400)
RBC: 2.75 MIL/uL — ABNORMAL LOW (ref 3.87–5.11)
RDW: 16.2 % — ABNORMAL HIGH (ref 11.5–15.5)
WBC: 8.5 10*3/uL (ref 4.0–10.5)
nRBC: 0.2 % (ref 0.0–0.2)

## 2023-04-04 LAB — PREPARE RBC (CROSSMATCH)

## 2023-04-04 MED ORDER — SODIUM CHLORIDE 0.9 % IV SOLN: INTRAVENOUS | Status: DC

## 2023-04-04 MED ORDER — SODIUM CHLORIDE 0.9% IV SOLUTION: Freq: Once | INTRAVENOUS | Status: AC

## 2023-04-04 MED ORDER — PANTOPRAZOLE SODIUM 40 MG PO TBEC
40.0000 mg | DELAYED_RELEASE_TABLET | Freq: Every day | ORAL | Status: DC
  Administered 2023-04-05: 40 mg via ORAL
  Filled 2023-04-04: qty 1

## 2023-04-04 MED ORDER — AMOXICILLIN-POT CLAVULANATE 500-125 MG PO TABS
1.0000 | ORAL_TABLET | Freq: Two times a day (BID) | ORAL | Status: DC
  Administered 2023-04-04 – 2023-04-05 (×3): 1 via ORAL
  Filled 2023-04-04 (×3): qty 1

## 2023-04-04 NOTE — Progress Notes (Signed)
PROGRESS NOTE    Lacey Vega  MVH:[id number] DOB: [birth year] DOA: 04/01/2023 PCP: Gwenyth Bender, MD   Brief Narrative: 72 year old female was sent to the hospital from cancer center due to abnormal labs hemoglobin 5.4 at the cancer infusion center.  Patient has chronic black stools on aspirin and Plavix with history of peptic ulcer disease 2 diabetes hypertension hyperlipidemia AAA chronic back pain hypothyroidism.  Admitted with anemia generalized weakness and fatigue worse over the last 48 hours.  Previous endoscopy in June 2024 with angiodysplastic lesions treated with APC.  Patient admitted for enteroscopy.  Holding aspirin and Plavix received 2 units of blood transfusion with improvement in hemoglobin to 7.7 from 5.3 on admission. Followed by Dr. Leonides Schanz for iron deficiency anemia She complains of a cough with productive thick yellow phlegm  Assessment & Plan:   Principal Problem:   GI bleed Active Problems:   Hypothyroidism (acquired)   AAA (abdominal aortic aneurysm)   Acute on chronic blood loss anemia   AKI (acute kidney injury) (HCC)   PAD (peripheral artery disease) (HCC)   HLD (hyperlipidemia)   Sickle cell trait (HCC)   Iron deficiency anemia due to chronic blood loss   Melena   Symptomatic anemia   Angiodysplasia of stomach and duodenum   Acute on chronic GI bleed/iron deficiency anemia-in the setting of angiodysplastic lesions in the duodenum, aspirin and Plavix on hold.  Status post 1 unit of Venofer and 2 unit of packed RBCs with improvement in hemoglobin.  Hemoglobin 7.2 from 8.2 from 5.3 on admit.   She had a small bowel enteroscopy on 04/03/2023-a single nonbleeding AVM in the duodenum treated with APC.  Normal stomach esophagus and examined portion of the jejunum.  No specimens were collected.  Her prior small bowel endoscopy was on 11/07/2022 when she had full-blown bleeding AVMs that were treated with APC.  Bronchitis productive cough with no hypoxia no  evidence of infiltrates by chest x-ray on admit.  Her cough continues to get worse no issues short of breath chest x-ray does not show infiltrates however she does appear some more short of breath and wheezy.  I will start her on Augmentin and continue neb treatments.   PAD- Patient has history of PAD with endovascular stent repair by Dr. Lenell Antu and has been on Plavix for that. She is also on 81 mg aspirin per cardiology.   Hypothyroidism  Continue Synthroid   Chronic back pain. In February CT scan did not show any acute musculoskeletal abnormality.  lumbar spine x-ray - no fractures   AKI (acute kidney injury) (HCC) Baseline serum creatinine 0.8.  Creatinine still trending up we will hold Lasix and start IV fluids hopefully this will improve her renal functions.  Will check renal ultrasound.   HLD (hyperlipidemia) Continue statin.   Sickle cell trait (HCC) Noted.       Estimated body mass index is 28.04 kg/m as calculated from the following:   Height as of this encounter: 5\' 7"  (1.702 m).   Weight as of this encounter: 81.2 kg.  DVT prophylaxis:scd Code Status:full Family Communication called dtr no response  Disposition Plan:  Status is: Inpatient Remains inpatient appropriate because: gi bleed   Consultants:  gi  Procedures:none Antimicrobials: none  Subjective:  Complains of cough productive short of breath no fever no white count Nausea vomiting no further bowel movements   Objective: Vitals:   04/03/23 2123 04/04/23 0006 04/04/23 0421 04/04/23 0744  BP:  (!) 109/39 (!) 104/52  Pulse:  79 77   Resp:  18 16   Temp:  99.5 F (37.5 C) 98.6 F (37 C)   TempSrc:  Oral Oral   SpO2: 99% 98% 97% 95%  Weight:      Height:        Intake/Output Summary (Last 24 hours) at 04/04/2023 1212 Last data filed at 04/03/2023 1715 Gross per 24 hour  Intake 240 ml  Output --  Net 240 ml   Filed Weights   04/01/23 1129 04/03/23 1145  Weight: 81.2 kg 81.2 kg     Examination:  General exam: Appears in nad  Respiratory system: Wheezing today compared to yesterday to auscultation. Respiratory effort normal. Cardiovascular system: S1 & S2 heard, RRR. No JVD, murmurs, rubs, gallops or clicks. No pedal edema. Gastrointestinal system: Abdomen is nondistended, soft and nontender. No organomegaly or masses felt. Normal bowel sounds heard. Central nervous system: Alert and oriented. No focal neurological deficits. Extremities: trace edema   Data Reviewed: I have personally reviewed following labs and imaging studies  CBC: Recent Labs  Lab 04/01/23 0925 04/01/23 0925 04/01/23 1215 04/02/23 0622 04/02/23 1356 04/02/23 1950 04/03/23 0533 04/03/23 1355 04/03/23 1931 04/04/23 0246 04/04/23 0904  WBC 5.6  --  6.1 6.8 6.7  --  6.3  --   --  8.5  --   NEUTROABS 3.5  --  4.0 5.0  --   --  4.2  --   --  6.2  --   HGB 5.4*   < > 5.3* 7.7* 8.2*   < > 8.2* 8.0* 8.0* 7.2* 7.2*  HCT 17.8*  --  17.9* 24.3* 26.9*   < > 27.4* 26.2* 26.6* 23.8* 23.8*  MCV 84.4  --  85.6 83.8 85.9  --  85.9  --   --  86.5  --   PLT 340  --  371 353 388  --  387  --   --  342  --    < > = values in this interval not displayed.   Basic Metabolic Panel: Recent Labs  Lab 04/01/23 1215 04/02/23 0622 04/02/23 1356 04/03/23 0533 04/03/23 1413 04/04/23 0246  NA 143 141 141 141  --  140  K 3.5 3.5 3.5 3.3*  --  3.5  CL 111 108 107 104  --  107  CO2 25 26 28 28   --  26  GLUCOSE 98 94 109* 103*  --  113*  BUN 17 15 15 13   --  15  CREATININE 1.26* 1.30* 1.34* 1.32*  --  1.51*  CALCIUM 8.5* 8.5* 8.7* 8.8*  --  8.2*  MG  --   --   --   --  2.3  --    GFR: Estimated Creatinine Clearance: 36.4 mL/min (A) (by C-G formula based on SCr of 1.51 mg/dL (H)). Liver Function Tests: Recent Labs  Lab 04/01/23 1215 04/02/23 0622 04/02/23 1356 04/03/23 0533 04/04/23 0246  AST 16 18 17 17 15   ALT 10 9 10 10 11   ALKPHOS 85 91 96 90 77  BILITOT 1.0 1.5* 1.5* 1.5* 0.6  PROT 6.6  6.4* 7.0 6.6 5.9*  ALBUMIN 3.5 3.4* 3.5 3.5 3.1*   No results for input(s): "LIPASE", "AMYLASE" in the last 168 hours. No results for input(s): "AMMONIA" in the last 168 hours. Coagulation Profile: Recent Labs  Lab 04/02/23 0622 04/03/23 0533  INR 1.1 1.0   Cardiac Enzymes: No results for input(s): "CKTOTAL", "CKMB", "CKMBINDEX", "TROPONINI" in the last 168  hours. BNP (last 3 results) No results for input(s): "PROBNP" in the last 8760 hours. HbA1C: No results for input(s): "HGBA1C" in the last 72 hours. CBG: No results for input(s): "GLUCAP" in the last 168 hours. Lipid Profile: No results for input(s): "CHOL", "HDL", "LDLCALC", "TRIG", "CHOLHDL", "LDLDIRECT" in the last 72 hours. Thyroid Function Tests: No results for input(s): "TSH", "T4TOTAL", "FREET4", "T3FREE", "THYROIDAB" in the last 72 hours. Anemia Panel: No results for input(s): "VITAMINB12", "FOLATE", "FERRITIN", "TIBC", "IRON", "RETICCTPCT" in the last 72 hours.  Sepsis Labs: No results for input(s): "PROCALCITON", "LATICACIDVEN" in the last 168 hours.  No results found for this or any previous visit (from the past 240 hour(s)).       Radiology Studies: DG Chest 1 View  Result Date: 04/03/2023 CLINICAL DATA:  Cough. EXAM: CHEST  1 VIEW COMPARISON:  Radiographs 04/01/2023 and 05/12/2022. Chest CT 01/31/2021. FINDINGS: 1438 hours. The heart size and mediastinal contours are normal. The lungs are clear. There is no pleural effusion or pneumothorax. No acute osseous findings are identified. Previous lower cervical fusion noted. Telemetry leads overlie the chest. IMPRESSION: No evidence of acute cardiopulmonary process. Electronically Signed   By: Carey Bullocks M.D.   On: 04/03/2023 18:32        Scheduled Meds:  sodium chloride   Intravenous Once   amoxicillin-clavulanate  1 tablet Oral BID   atorvastatin  40 mg Oral QHS   donepezil  5 mg Oral QHS   furosemide  20 mg Oral Daily   levothyroxine  125 mcg Oral  Daily   montelukast  10 mg Oral QPM   [START ON 04/05/2023] pantoprazole  40 mg Oral Daily   Continuous Infusions:  iron sucrose 300 mg (04/03/23 1917)     LOS: 3 days    Time spent: 39 min  Alwyn Ren, MD  04/04/2023, 12:12 PM

## 2023-04-04 NOTE — Plan of Care (Signed)

## 2023-04-04 NOTE — Plan of Care (Signed)
  Problem: Education: Goal: Knowledge of General Education information will improve Description: Including pain rating scale, medication(s)/side effects and non-pharmacologic comfort measures 04/04/2023 1153 by Fraser Din, RN Outcome: Progressing 04/04/2023 0951 by Fraser Din, RN Outcome: Progressing   Problem: Health Behavior/Discharge Planning: Goal: Ability to manage health-related needs will improve 04/04/2023 1153 by Fraser Din, RN Outcome: Progressing 04/04/2023 0951 by Fraser Din, RN Outcome: Progressing   Problem: Clinical Measurements: Goal: Ability to maintain clinical measurements within normal limits will improve 04/04/2023 1153 by Fraser Din, RN Outcome: Progressing 04/04/2023 0951 by Fraser Din, RN Outcome: Progressing Goal: Will remain free from infection 04/04/2023 1153 by Fraser Din, RN Outcome: Progressing 04/04/2023 0951 by Fraser Din, RN Outcome: Progressing Goal: Diagnostic test results will improve 04/04/2023 1153 by Fraser Din, RN Outcome: Progressing 04/04/2023 0951 by Fraser Din, RN Outcome: Progressing Goal: Respiratory complications will improve 04/04/2023 1153 by Fraser Din, RN Outcome: Progressing 04/04/2023 0951 by Fraser Din, RN Outcome: Progressing Goal: Cardiovascular complication will be avoided 04/04/2023 1153 by Fraser Din, RN Outcome: Progressing 04/04/2023 0951 by Fraser Din, RN Outcome: Progressing   Problem: Activity: Goal: Risk for activity intolerance will decrease 04/04/2023 1153 by Fraser Din, RN Outcome: Progressing 04/04/2023 0951 by Fraser Din, RN Outcome: Progressing   Problem: Nutrition: Goal: Adequate nutrition will be maintained 04/04/2023 1153 by Fraser Din, RN Outcome: Progressing 04/04/2023 0951 by Fraser Din, RN Outcome: Progressing   Problem: Coping: Goal: Level of anxiety will decrease 04/04/2023 1153 by Fraser Din, RN Outcome: Progressing 04/04/2023 0951 by Fraser Din, RN Outcome: Progressing   Problem: Elimination: Goal: Will not experience complications related to bowel motility 04/04/2023 1153 by Fraser Din, RN Outcome: Progressing 04/04/2023 0951 by Fraser Din, RN Outcome: Progressing Goal: Will not experience complications related to urinary retention 04/04/2023 1153 by Fraser Din, RN Outcome: Progressing 04/04/2023 0951 by Fraser Din, RN Outcome: Progressing   Problem: Pain Management: Goal: General experience of comfort will improve 04/04/2023 1153 by Fraser Din, RN Outcome: Progressing 04/04/2023 0951 by Fraser Din, RN Outcome: Progressing   Problem: Safety: Goal: Ability to remain free from injury will improve 04/04/2023 1153 by Fraser Din, RN Outcome: Progressing 04/04/2023 0951 by Fraser Din, RN Outcome: Progressing   Problem: Skin Integrity: Goal: Risk for impaired skin integrity will decrease 04/04/2023 1153 by Fraser Din, RN Outcome: Progressing 04/04/2023 0951 by Fraser Din, RN Outcome: Progressing

## 2023-04-04 NOTE — Plan of Care (Signed)

## 2023-04-04 NOTE — Progress Notes (Signed)
Assuming care of this patient. RN report received. Pt resting comfortably, denies needs at this time. Head to toe assessment remains unchanged.

## 2023-04-05 DIAGNOSIS — D649 Anemia, unspecified: Secondary | ICD-10-CM | POA: Diagnosis not present

## 2023-04-05 LAB — COMPREHENSIVE METABOLIC PANEL
ALT: 9 U/L (ref 0–44)
AST: 15 U/L (ref 15–41)
Albumin: 3.3 g/dL — ABNORMAL LOW (ref 3.5–5.0)
Alkaline Phosphatase: 78 U/L (ref 38–126)
Anion gap: 8 (ref 5–15)
BUN: 15 mg/dL (ref 8–23)
CO2: 24 mmol/L (ref 22–32)
Calcium: 7.7 mg/dL — ABNORMAL LOW (ref 8.9–10.3)
Chloride: 112 mmol/L — ABNORMAL HIGH (ref 98–111)
Creatinine, Ser: 1.32 mg/dL — ABNORMAL HIGH (ref 0.44–1.00)
GFR, Estimated: 43 mL/min — ABNORMAL LOW (ref >60)
Glucose, Bld: 105 mg/dL — ABNORMAL HIGH (ref 70–99)
Potassium: 3.7 mmol/L (ref 3.5–5.1)
Sodium: 144 mmol/L (ref 135–145)
Total Bilirubin: 1.3 mg/dL — ABNORMAL HIGH (ref 0.3–1.2)
Total Protein: 6.2 g/dL — ABNORMAL LOW (ref 6.5–8.1)

## 2023-04-05 LAB — CBC
HCT: 27.2 % — ABNORMAL LOW (ref 36.0–46.0)
Hemoglobin: 8.4 g/dL — ABNORMAL LOW (ref 12.0–15.0)
MCH: 26.9 pg (ref 26.0–34.0)
MCHC: 30.9 g/dL (ref 30.0–36.0)
MCV: 87.2 fL (ref 80.0–100.0)
Platelets: 330 10*3/uL (ref 150–400)
RBC: 3.12 MIL/uL — ABNORMAL LOW (ref 3.87–5.11)
RDW: 16.1 % — ABNORMAL HIGH (ref 11.5–15.5)
WBC: 7.3 10*3/uL (ref 4.0–10.5)
nRBC: 0 % (ref 0.0–0.2)

## 2023-04-05 LAB — TYPE AND SCREEN
ABO/RH(D): A POS
Antibody Screen: NEGATIVE
Donor AG Type: NEGATIVE
Donor AG Type: NEGATIVE
Donor AG Type: NEGATIVE
Unit division: 0
Unit division: 0
Unit division: 0

## 2023-04-05 LAB — BPAM RBC
Blood Product Expiration Date: 202411202359
Blood Product Expiration Date: 202411222359
Blood Product Expiration Date: 202411242359
ISSUE DATE / TIME: 202410301739
ISSUE DATE / TIME: 202410301433
ISSUE DATE / TIME: 202411021545
Unit Type and Rh: 5100
Unit Type and Rh: 5100
Unit Type and Rh: 6200

## 2023-04-05 LAB — HEMOGLOBIN AND HEMATOCRIT, BLOOD
HCT: 27 % — ABNORMAL LOW (ref 36.0–46.0)
Hemoglobin: 8.2 g/dL — ABNORMAL LOW (ref 12.0–15.0)

## 2023-04-05 MED ORDER — AMOXICILLIN-POT CLAVULANATE 500-125 MG PO TABS: 1.0000 | ORAL_TABLET | Freq: Two times a day (BID) | ORAL | 0 refills | Status: DC

## 2023-04-05 NOTE — Plan of Care (Signed)
Patient and grand daughter verbalized understanding of discharge instructions. No signs or symptoms of acute distress at the time of discharge. Problem: Education: Goal: Knowledge of General Education information will improve Description: Including pain rating scale, medication(s)/side effects and non-pharmacologic comfort measures Outcome: Adequate for Discharge   Problem: Health Behavior/Discharge Planning: Goal: Ability to manage health-related needs will improve Outcome: Adequate for Discharge   Problem: Clinical Measurements: Goal: Ability to maintain clinical measurements within normal limits will improve Outcome: Adequate for Discharge Goal: Will remain free from infection Outcome: Adequate for Discharge Goal: Diagnostic test results will improve Outcome: Adequate for Discharge Goal: Respiratory complications will improve Outcome: Adequate for Discharge Goal: Cardiovascular complication will be avoided Outcome: Adequate for Discharge   Problem: Activity: Goal: Risk for activity intolerance will decrease Outcome: Adequate for Discharge   Problem: Nutrition: Goal: Adequate nutrition will be maintained Outcome: Adequate for Discharge   Problem: Coping: Goal: Level of anxiety will decrease Outcome: Adequate for Discharge   Problem: Elimination: Goal: Will not experience complications related to bowel motility Outcome: Adequate for Discharge Goal: Will not experience complications related to urinary retention Outcome: Adequate for Discharge   Problem: Pain Management: Goal: General experience of comfort will improve Outcome: Adequate for Discharge   Problem: Safety: Goal: Ability to remain free from injury will improve Outcome: Adequate for Discharge   Problem: Skin Integrity: Goal: Risk for impaired skin integrity will decrease Outcome: Adequate for Discharge

## 2023-04-05 NOTE — TOC Initial Note (Signed)
Transition of Care [facility]) - Initial/Assessment Note    Patient Details  Name: Lacey Vega MRN: [id number] Date of Birth: 1950/07/23  [facility] CM/SW Contact:    Adrian Prows, RN Phone Number: 04/05/2023, 1:08 PM  Clinical Narrative:                   Expected Discharge Plan: Home/Self Care Barriers to Discharge: No Barriers Identified   Patient Goals and CMS Choice Patient states their goals for this hospitalization and ongoing recovery are:: home          Expected Discharge Plan and Services   Discharge Planning Services: CM Consult Post Acute Care Choice: NA Living arrangements for the past 2 months: Single Family Home Expected Discharge Date: 04/05/23               DME Arranged: N/A DME Agency: NA       HH Arranged: NA HH Agency: NA        Prior Living Arrangements/Services Living arrangements for the past 2 months: Single Family Home Lives with:: Adult Children Patient language and need for interpreter reviewed:: Yes Do you feel safe going back to the place where you live?: Yes      Need for Family Participation in Patient Care: Yes (Comment) Care giver support system in place?: Yes (comment) Current home services: DME (cane, walker, Rolator, shower chair, BSC) Criminal Activity/Legal Involvement Pertinent to Current Situation/Hospitalization: No - Comment as needed  Activities of Daily Living   ADL Screening (condition at time of admission) Independently performs ADLs?: Yes (appropriate for developmental age) Is the patient deaf or have difficulty hearing?: No Does the patient have difficulty seeing, even when wearing glasses/contacts?: No Does the patient have difficulty concentrating, remembering, or making decisions?: No  Permission Sought/Granted Permission sought to share information with : Case Manager Permission granted to share information with : Yes, Verbal Permission Granted  Share Information with NAME:  Case Manager     Permission granted to share info w Relationship: Nemiah Bubar (dtr) [phone]     Emotional Assessment Appearance:: Appears stated age Attitude/Demeanor/Rapport: Gracious Affect (typically observed): Accepting Orientation: : Oriented to Self, Oriented to Place, Oriented to  Time, Oriented to Situation Alcohol / Substance Use: Illicit Drugs Psych Involvement: No (comment)  Admission diagnosis:  Melena [K92.1] GI bleed [K92.2] Symptomatic anemia [D64.9] Patient Active Problem List   Diagnosis Date Noted   Melena 04/02/2023   Symptomatic anemia 04/02/2023   Angiodysplasia of stomach and duodenum 04/02/2023   GI bleed 04/01/2023   Chronic kidney disease, stage 3a (HCC) 07/05/2021   Iron deficiency anemia due to chronic blood loss 05/09/2021   Acute on chronic blood loss anemia 04/22/2021   AKI (acute kidney injury) (HCC) 04/22/2021   Epistaxis 04/22/2021   PAD (peripheral artery disease) (HCC) 04/22/2021   HLD (hyperlipidemia) 04/22/2021   Sickle cell trait (HCC) 04/22/2021   AAA (abdominal aortic aneurysm) 02/01/2021   Aortic aneurysm (HCC) 01/31/2021   Achalasia 10/11/2017   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   DJD (degenerative joint disease), cervical    Iron deficiency anemia    History of arteriovenous malformation (AVM)    History of tobacco use    Asthma    H/O chest pain    Diabetes (HCC)    Dysphagia    Iron deficiency anemia, unspecified 12/03/2011   Hypothyroidism (acquired) 10/07/2010    Class: Chronic   DYSPHAGIA UNSPECIFIED 10/05/2009   ANEMIA 08/20/2009  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   DM 04/17/2009   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/05/1990    Class: Chronic   PCP:  Gwenyth Bender, MD Pharmacy:   [facility] [phone] - [place], [place] - [address] AT [facility] [address] [place] [address] Phone: [phone] Fax:  [phone]     Social Determinants of Health (SDOH) Social History: SDOH Screenings   Food Insecurity: No Food Insecurity (04/05/2023)  Housing: Patient Declined (04/05/2023)  Transportation Needs: No Transportation Needs (04/05/2023)  Utilities: Not At Risk (04/05/2023)  Tobacco Use: Medium Risk (04/03/2023)   SDOH Interventions: Food Insecurity Interventions: Intervention Not Indicated, Inpatient TOC Housing Interventions: Intervention Not Indicated, Inpatient TOC Transportation Interventions: Intervention Not Indicated, Inpatient TOC Utilities Interventions: Intervention Not Indicated, Inpatient TOC   Readmission Risk Interventions    02/05/2021   11:53 AM  Readmission Risk Prevention Plan  Post Dischage Appt Complete  Medication Screening Complete  Transportation Screening Complete

## 2023-04-05 NOTE — Discharge Summary (Signed)
Physician Discharge Summary  Lacey Vega ZOX:[id number] DOB: Jan 22, 1951 DOA: 04/01/2023  PCP: Gwenyth Bender, MD  Admit date: 04/01/2023 Discharge date: 04/05/2023  Admitted From: Home Disposition: Home  Recommendations for Outpatient Follow-up:  Follow up with PCP in 1-2 weeks Please obtain BMP/CBC in one week Please follow up to [facility] and GI Dr. August Saucer please note I stopped her Lasix and potassium due to soft blood pressure during the entire hospital stay.  Please follow-up on her renal functions.  Home Health: None Equipment/Devices: None  Discharge Condition stable CODE STATUS: Full Diet recommendation: Cardiac  Brief/Interim Summary:72 year old female was sent to the hospital from cancer center due to abnormal labs hemoglobin 5.4 at the cancer infusion center.  Patient has chronic black stools on aspirin and Plavix with history of peptic ulcer disease 2 diabetes hypertension hyperlipidemia AAA chronic back pain hypothyroidism.  Admitted with anemia generalized weakness and fatigue worse over the last 48 hours.  Previous endoscopy in June 2024 with angiodysplastic lesions treated with APC.  Patient admitted for enteroscopy.  Holding aspirin and Plavix received 2 units of blood transfusion with improvement in hemoglobin to 7.7 from 5.3 on admission. Followed by Dr. Leonides Schanz for iron deficiency anemia She complains of a cough with productive thick yellow phlegm   Discharge Diagnoses:  Principal Problem:   GI bleed Active Problems:   Hypothyroidism (acquired)   AAA (abdominal aortic aneurysm)   Acute on chronic blood loss anemia   AKI (acute kidney injury) (HCC)   PAD (peripheral artery disease) (HCC)   HLD (hyperlipidemia)   Sickle cell trait (HCC)   Iron deficiency anemia due to chronic blood loss   Melena   Symptomatic anemia   Angiodysplasia of stomach and duodenum    Acute on chronic GI bleed/iron deficiency anemia-in the setting of angiodysplastic lesions in the  duodenum, aspirin and Plavix on hold.  She received 1 unit of Venofer and 2 unit of packed RBCs.  Hemoglobin on the day of discharge was 8.2 from 5.3 on admission.  She had a small bowel enteroscopy on 04/03/2023-a single nonbleeding AVM in the duodenum treated with APC.  Normal stomach esophagus and examined portion of the jejunum.  No specimens were collected.  Her prior small bowel endoscopy was on 11/07/2022 when she had full-blown bleeding AVMs that were treated with APC.   Bronchitis productive cough with no hypoxia no evidence of infiltrates by chest x-ray on admit.  Her cough continues to get worse with short of breath chest x-ray does not show infiltrates however she does appear some more short of breath and wheezy.  I treated her with Augmentin and discharged on Augmentin for a total of 5 days.  PAD- Patient has history of PAD with endovascular stent repair by Dr. Lenell Antu and has been on Plavix for that.  She was also on aspirin at the time of admission.  I discussed with vascular surgery Dr. Linnell Fulling who recommended to DC Plavix and continue only aspirin. Her stent was placed over a year ago and she received both aspirin and Plavix for over a year.   Hypothyroidism  Continue Synthroid   Chronic back pain. In February CT scan did not show any acute musculoskeletal abnormality.  lumbar spine x-ray - no fractures   AKI (acute kidney injury) (HCC) improved with hydration renal ultrasound showed no hydronephrosis.  Moreover she runs a soft blood pressure and she is on Lasix.  I discussed with her daughter and told her not to take Lasix and  potassium till seen by primary care physician as her blood pressure was too soft to's continue Lasix which probably added to AKI.   HLD (hyperlipidemia) Continue statin.   Sickle cell trait (HCC) follow-up with hematology   Estimated body mass index is 28.04 kg/m as calculated from the following:   Height as of this encounter: 5\' 7"  (1.702 m).   Weight  as of this encounter: 81.2 kg.  Discharge Instructions  Discharge Instructions     Diet - low sodium heart healthy   Complete by: As directed    Increase activity slowly   Complete by: As directed       Allergies as of 04/05/2023       Reactions   Oxycodone Shortness Of Breath        Medication List     STOP taking these medications    clopidogrel 75 MG tablet Commonly known as: PLAVIX   furosemide 20 MG tablet Commonly known as: LASIX   potassium chloride SA 20 MEQ tablet Commonly known as: KLOR-CON M       TAKE these medications    acetaminophen 500 MG tablet Commonly known as: TYLENOL Take 500-1,000 mg by mouth every 6 (six) hours as needed for mild pain or headache.   albuterol 108 (90 Base) MCG/ACT inhaler Commonly known as: VENTOLIN HFA Inhale 1-2 puffs into the lungs every 6 (six) hours as needed for wheezing or shortness of breath.   amoxicillin-clavulanate 500-125 MG tablet Commonly known as: AUGMENTIN Take 1 tablet by mouth 2 (two) times daily.   ascorbic acid 500 MG tablet Commonly known as: VITAMIN C Take 500 mg by mouth daily.   aspirin EC 81 MG tablet Take 1 tablet (81 mg total) by mouth daily. **Start on 11/25** What changed:  when to take this additional instructions   atorvastatin 40 MG tablet Commonly known as: LIPITOR Take 40 mg by mouth at bedtime.   Biotin 2500 MCG Chew Chew 5,000 mcg by mouth daily in the afternoon.   Cinnamon 500 MG Tabs Take 1,000 mg by mouth every evening.   cyanocobalamin 1000 MCG tablet Commonly known as: VITAMIN B12 Take 1,000 mcg by mouth daily.   donepezil 5 MG tablet Commonly known as: ARICEPT Take 5 mg by mouth at bedtime.   ferrous gluconate 324 MG tablet Commonly known as: FERGON Take 324 mg by mouth daily with breakfast.   Fish Oil Burp-Less 1200 MG Caps Take 1,200 mg by mouth daily in the afternoon.   folic acid 800 MCG tablet Commonly known as: FOLVITE Take 800 mcg by mouth  daily.   levothyroxine 125 MCG tablet Commonly known as: SYNTHROID Take 125 mcg by mouth See admin instructions. Take 125 mcg by mouth before breakfast on Mon/Tues/Wed/Thurs/Fri/Sat (take nothing on Sundays)   linaclotide 145 MCG Caps capsule Commonly known as: Linzess Take 1 capsule (145 mcg total) by mouth daily before breakfast. What changed:  when to take this reasons to take this   montelukast 10 MG tablet Commonly known as: SINGULAIR Take 10 mg by mouth daily in the afternoon.   nitroGLYCERIN 0.4 MG SL tablet Commonly known as: NITROSTAT Place 0.4 mg under the tongue every 5 (five) minutes as needed for chest pain.   pantoprazole 40 MG tablet Commonly known as: PROTONIX Take 1 tablet (40 mg total) by mouth daily before breakfast.   Wixela Inhub 250-50 MCG/ACT Aepb Generic drug: fluticasone-salmeterol Inhale 1 puff into the lungs in the morning and at bedtime.  Follow-up Information     Gwenyth Bender, MD Follow up.   Specialty: Internal Medicine Why: call for appointment  I have stopped her Lasix and potassium since her blood pressure was soft here please reassess and restart if needed.  I discussed with vascular surgery and stopped her Plavix and continue only aspirin. Contact information: [address] [place] [address] [phone]         Jaci Standard, MD Follow up.   Specialty: Hematology and Oncology Contact information: [address] [place] [address] [phone]                Allergies  Allergen Reactions   Oxycodone Shortness Of Breath    Consultations: GI   Procedures/Studies: US RENAL  Result Date: 04/04/2023 CLINICAL DATA:  AKI EXAM: RENAL / URINARY TRACT ULTRASOUND COMPLETE COMPARISON:  None Available. FINDINGS: Right Kidney: Renal measurements: 8.8 x 3.5 x 4.5 cm = volume: 75.5 mL. Echogenicity within normal limits. No mass or hydronephrosis visualized. Left Kidney: Renal  measurements: 8.5 x 4.8 x 4.7 cm = volume: 99.5 mL. Echogenicity within normal limits. No mass or hydronephrosis visualized. Bladder: Appears normal for degree of bladder distention. Other: Suboptimal images due to overlying bowel gas. IMPRESSION: No evidence of hydronephrosis. Electronically Signed   By: Allegra Lai M.D.   On: 04/04/2023 15:49   DG Chest 1 View  Result Date: 04/03/2023 CLINICAL DATA:  Cough. EXAM: CHEST  1 VIEW COMPARISON:  Radiographs 04/01/2023 and 05/12/2022. Chest CT 01/31/2021. FINDINGS: 1438 hours. The heart size and mediastinal contours are normal. The lungs are clear. There is no pleural effusion or pneumothorax. No acute osseous findings are identified. Previous lower cervical fusion noted. Telemetry leads overlie the chest. IMPRESSION: No evidence of acute cardiopulmonary process. Electronically Signed   By: Carey Bullocks M.D.   On: 04/03/2023 18:32   DG Lumbar Spine Complete  Result Date: 04/01/2023 CLINICAL DATA:  Chronic dark stools and back pain. EXAM: LUMBAR SPINE - COMPLETE 4+ VIEW COMPARISON:  01/29/2021 FINDINGS: There is transitional anatomy with sacralization of the L5 vertebral body. For the purpose of this report, the lowest formed disc space is designated L5-S1. No evidence of acute fracture or traumatic listhesis. Multilevel mild spondylosis and facet arthropathy. Aortoiliac endovascular stents and left femoral stent. Embolization coils right hemipelvis. Cholecystectomy. IMPRESSION: No evidence of acute fracture or traumatic listhesis. Electronically Signed   By: Minerva Fester M.D.   On: 04/01/2023 19:49   DG Chest Port 1 View  Result Date: 04/01/2023 CLINICAL DATA:  Shortness of breath EXAM: PORTABLE CHEST 1 VIEW COMPARISON:  X-ray 05/12/2022. CT angiogram abdomen pelvis 07/20/2022. FINDINGS: Normal cardiopericardial silhouette. No consolidation, pneumothorax or effusion. No edema. Overlapping cardiac leads. Fixation hardware along the lower cervical  spine at the edge of the imaging field. There is some enlargement of the central pulmonary arteries. IMPRESSION: No acute cardiopulmonary disease. Electronically Signed   By: Karen Kays M.D.   On: 04/01/2023 15:54   (Echo, Carotid, EGD, Colonoscopy, ERCP)    Subjective:  Resting in bed getting a little anxious wants to go home cough.  Better breathing better Discharge Exam: Vitals:   04/05/23 0510 04/05/23 1235  BP: (!) 112/52 (!) 115/59  Pulse: 67 72  Resp: 18 14  Temp: 98.4 F (36.9 C) 97.8 F (36.6 C)  SpO2: 98% 100%   Vitals:   04/04/23 1827 04/04/23 2107 04/05/23 0510 04/05/23 1235  BP: 111/60 (!) 110/48 (!) 112/52 Marland Kitchen)  115/59  Pulse: 72 74 67 72  Resp: 18 18 18 14   Temp: 98.3 F (36.8 C) 98.7 F (37.1 C) 98.4 F (36.9 C) 97.8 F (36.6 C)  TempSrc: Oral Oral Oral Oral  SpO2: 100% 100% 98% 100%  Weight:      Height:        General: Pt is alert, awake, not in acute distress Cardiovascular: RRR, S1/S2 +, no rubs, no gallops Respiratory: Scattered wheezes bilaterally Abdominal: Soft, NT, ND, bowel sounds + Extremities: no edema, no cyanosis    The results of significant diagnostics from this hospitalization (including imaging, microbiology, ancillary and laboratory) are listed below for reference.     Microbiology: No results found for this or any previous visit (from the past 240 hour(s)).   Labs: BNP (last 3 results) No results for input(s): "BNP" in the last 8760 hours. Basic Metabolic Panel: Recent Labs  Lab 04/02/23 0622 04/02/23 1356 04/03/23 0533 04/03/23 1413 04/04/23 0246 04/05/23 0253  NA 141 141 141  --  140 144  K 3.5 3.5 3.3*  --  3.5 3.7  CL 108 107 104  --  107 112*  CO2 26 28 28   --  26 24  GLUCOSE 94 109* 103*  --  113* 105*  BUN 15 15 13   --  15 15  CREATININE 1.30* 1.34* 1.32*  --  1.51* 1.32*  CALCIUM 8.5* 8.7* 8.8*  --  8.2* 7.7*  MG  --   --   --  2.3  --   --    Liver Function Tests: Recent Labs  Lab 04/02/23 0622  04/02/23 1356 04/03/23 0533 04/04/23 0246 04/05/23 0253  AST 18 17 17 15 15   ALT 9 10 10 11 9   ALKPHOS 91 96 90 77 78  BILITOT 1.5* 1.5* 1.5* 0.6 1.3*  PROT 6.4* 7.0 6.6 5.9* 6.2*  ALBUMIN 3.4* 3.5 3.5 3.1* 3.3*   No results for input(s): "LIPASE", "AMYLASE" in the last 168 hours. No results for input(s): "AMMONIA" in the last 168 hours. CBC: Recent Labs  Lab 04/01/23 0925 04/01/23 0925 04/01/23 1215 04/02/23 0622 04/02/23 1356 04/02/23 1950 04/03/23 0533 04/03/23 1355 04/04/23 0246 04/04/23 0904 04/04/23 1323 04/04/23 2109 04/05/23 0253 04/05/23 0811  WBC 5.6   < > 6.1 6.8 6.7  --  6.3  --  8.5  --   --   --  7.3  --   NEUTROABS 3.5  --  4.0 5.0  --   --  4.2  --  6.2  --   --   --   --   --   HGB 5.4*   < > 5.3* 7.7* 8.2*   < > 8.2*   < > 7.2* 7.2* 7.5* 9.0* 8.4* 8.2*  HCT 17.8*  --  17.9* 24.3* 26.9*   < > 27.4*   < > 23.8* 23.8* 25.4* 29.6* 27.2* 27.0*  MCV 84.4  --  85.6 83.8 85.9  --  85.9  --  86.5  --   --   --  87.2  --   PLT 340   < > 371 353 388  --  387  --  342  --   --   --  330  --    < > = values in this interval not displayed.   Cardiac Enzymes: No results for input(s): "CKTOTAL", "CKMB", "CKMBINDEX", "TROPONINI" in the last 168 hours. BNP: Invalid input(s): "POCBNP" CBG: No results for input(s): "GLUCAP" in the last  168 hours. D-Dimer No results for input(s): "DDIMER" in the last 72 hours. Hgb A1c No results for input(s): "HGBA1C" in the last 72 hours. Lipid Profile No results for input(s): "CHOL", "HDL", "LDLCALC", "TRIG", "CHOLHDL", "LDLDIRECT" in the last 72 hours. Thyroid function studies No results for input(s): "TSH", "T4TOTAL", "T3FREE", "THYROIDAB" in the last 72 hours.  Invalid input(s): "FREET3" Anemia work up No results for input(s): "VITAMINB12", "FOLATE", "FERRITIN", "TIBC", "IRON", "RETICCTPCT" in the last 72 hours. Urinalysis    Component Value Date/Time   COLORURINE YELLOW 03/27/2009 1801   APPEARANCEUR CLOUDY (A)  03/27/2009 1801   LABSPEC 1.003 (L) 03/27/2009 1801   PHURINE 6.5 03/27/2009 1801   GLUCOSEU NEGATIVE 03/27/2009 1801   HGBUR NEGATIVE 03/27/2009 1801   BILIRUBINUR NEGATIVE 03/27/2009 1801   KETONESUR NEGATIVE 03/27/2009 1801   PROTEINUR NEGATIVE 03/27/2009 1801   UROBILINOGEN 0.2 03/27/2009 1801   NITRITE NEGATIVE 03/27/2009 1801   LEUKOCYTESUR  03/27/2009 1801    NEGATIVE MICROSCOPIC NOT DONE ON URINES WITH NEGATIVE PROTEIN, BLOOD, LEUKOCYTES, NITRITE, OR GLUCOSE <1000 mg/dL.   Sepsis Labs Recent Labs  Lab 04/02/23 1356 04/03/23 0533 04/04/23 0246 04/05/23 0253  WBC 6.7 6.3 8.5 7.3   Microbiology No results found for this or any previous visit (from the past 240 hour(s)).   Time coordinating discharge: 39 minutes  SIGNED:  Alwyn Ren, MD  [facility] 04/05/2023, 4:37 PM

## 2023-04-08 ENCOUNTER — Telehealth: Payer: Self-pay | Admitting: Hematology and Oncology

## 2023-04-08 ENCOUNTER — Encounter (HOSPITAL_COMMUNITY): Payer: Self-pay | Admitting: Internal Medicine

## 2023-04-09 ENCOUNTER — Inpatient Hospital Stay: Payer: Medicare HMO | Attending: Hematology and Oncology | Admitting: Hematology and Oncology

## 2023-04-09 ENCOUNTER — Other Ambulatory Visit: Payer: Self-pay | Admitting: Hematology and Oncology

## 2023-04-09 ENCOUNTER — Inpatient Hospital Stay: Payer: Medicare HMO

## 2023-04-09 VITALS — BP 132/59 | HR 67 | Temp 98.2°F | Resp 13 | Wt 195.2 lb

## 2023-04-09 DIAGNOSIS — D500 Iron deficiency anemia secondary to blood loss (chronic): Secondary | ICD-10-CM | POA: Insufficient documentation

## 2023-04-09 DIAGNOSIS — D573 Sickle-cell trait: Secondary | ICD-10-CM | POA: Diagnosis not present

## 2023-04-09 DIAGNOSIS — Z87891 Personal history of nicotine dependence: Secondary | ICD-10-CM | POA: Insufficient documentation

## 2023-04-09 DIAGNOSIS — Z809 Family history of malignant neoplasm, unspecified: Secondary | ICD-10-CM | POA: Diagnosis not present

## 2023-04-09 DIAGNOSIS — K31811 Angiodysplasia of stomach and duodenum with bleeding: Secondary | ICD-10-CM | POA: Insufficient documentation

## 2023-04-09 LAB — CBC WITH DIFFERENTIAL (CANCER CENTER ONLY)
Abs Immature Granulocytes: 0.01 10*3/uL (ref 0.00–0.07)
Basophils Absolute: 0 10*3/uL (ref 0.0–0.1)
Basophils Relative: 1 %
Eosinophils Absolute: 0.3 10*3/uL (ref 0.0–0.5)
Eosinophils Relative: 4 %
HCT: 29.7 % — ABNORMAL LOW (ref 36.0–46.0)
Hemoglobin: 9 g/dL — ABNORMAL LOW (ref 12.0–15.0)
Immature Granulocytes: 0 %
Lymphocytes Relative: 14 %
Lymphs Abs: 0.8 10*3/uL (ref 0.7–4.0)
MCH: 26.6 pg (ref 26.0–34.0)
MCHC: 30.3 g/dL (ref 30.0–36.0)
MCV: 87.9 fL (ref 80.0–100.0)
Monocytes Absolute: 0.5 10*3/uL (ref 0.1–1.0)
Monocytes Relative: 9 %
Neutro Abs: 4.1 10*3/uL (ref 1.7–7.7)
Neutrophils Relative %: 72 %
Platelet Count: 309 10*3/uL (ref 150–400)
RBC: 3.38 MIL/uL — ABNORMAL LOW (ref 3.87–5.11)
RDW: 16.5 % — ABNORMAL HIGH (ref 11.5–15.5)
WBC Count: 5.7 10*3/uL (ref 4.0–10.5)
nRBC: 0 % (ref 0.0–0.2)

## 2023-04-09 LAB — CMP (CANCER CENTER ONLY)
ALT: 8 U/L (ref 0–44)
AST: 14 U/L — ABNORMAL LOW (ref 15–41)
Albumin: 3.6 g/dL (ref 3.5–5.0)
Alkaline Phosphatase: 92 U/L (ref 38–126)
Anion gap: 4 — ABNORMAL LOW (ref 5–15)
BUN: 16 mg/dL (ref 8–23)
CO2: 30 mmol/L (ref 22–32)
Calcium: 8.7 mg/dL — ABNORMAL LOW (ref 8.9–10.3)
Chloride: 112 mmol/L — ABNORMAL HIGH (ref 98–111)
Creatinine: 1.15 mg/dL — ABNORMAL HIGH (ref 0.44–1.00)
GFR, Estimated: 50 mL/min — ABNORMAL LOW (ref >60)
Glucose, Bld: 102 mg/dL — ABNORMAL HIGH (ref 70–99)
Potassium: 3.9 mmol/L (ref 3.5–5.1)
Sodium: 146 mmol/L — ABNORMAL HIGH (ref 135–145)
Total Bilirubin: 0.5 mg/dL (ref <1.2)
Total Protein: 6.4 g/dL — ABNORMAL LOW (ref 6.5–8.1)

## 2023-04-09 LAB — IRON AND IRON BINDING CAPACITY (CC-WL,HP ONLY)
Iron: 33 ug/dL (ref 28–170)
Saturation Ratios: 9 % — ABNORMAL LOW (ref 10.4–31.8)
TIBC: 361 ug/dL (ref 250–450)
UIBC: 328 ug/dL (ref 148–442)

## 2023-04-09 LAB — RETIC PANEL
Immature Retic Fract: 15.9 % (ref 2.3–15.9)
RBC.: 3.15 MIL/uL — ABNORMAL LOW (ref 3.87–5.11)
Retic Count, Absolute: 44 10*3/uL (ref 19.0–186.0)
Retic Ct Pct: 1.4 % (ref 0.4–3.1)
Reticulocyte Hemoglobin: 29.6 pg (ref >27.9)

## 2023-04-09 LAB — FERRITIN: Ferritin: 373 ng/mL — ABNORMAL HIGH (ref 11–307)

## 2023-04-09 NOTE — Progress Notes (Signed)
[facility] [phone]   [phone]  PROGRESS NOTE  Patient Care Team: Gwenyth Bender, MD as PCP - General (Internal Medicine)  Hematological/Oncological History # Iron Deficiency Anemia due to Chronic Blood Loss # Sickle Cell Trait  1) 03/26/2009: Hgb 7.2, first Hgb on record 2) 09/02/2011: 2 units of PRBC at [facility] (Hgb noted at 8.9).  3)  12/03/2011: colonoscopy, found colon polyp and sigmoid diverticula. 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 Underwent argon plasma coagulation  4) Jan 2018: colonoscopy with 2 tubular adenomas removed. Small colonic AVM and small internal hemorrhoids.  5)10/24/2017: Hgb 7.7, MCV 71, Plt 338, and WBC 6.7. POEM procedure for Achalasia. duodenal AVM fulgurated.  6) 03/2018: Hgb 8.2.  7) March 2020: capsule endoscopy: nonbleeding gastric and proximal small bowel AVM,  8) 04/26/2019: Establish Care with Dr. Leonides Schanz  WBC 6.1, Hgb 9.3, MCV 75.3, Plt 289 9) 05/17/2019: Underwent EGD which showed esophagitis and small non-bleeding duodenal angiodysplasias. 10) 07/27/2019: WBC 5.9, Hgb 9.8, MCV 77.8, Plt 271. Recommend IV iron based on labs. Received feraheme 510mg  IV q7 days x 2 doses on 3/8 and 08/15/2019. Hgb electrophoresis confirms sickle cell trait.  11) 10/24/2019: WBC 5.4, Hgb 10.2, MCV 80.5, Plt 252 12) 02/15/2020: WBC 5.1, Hgb 9.7, MCV 80.2, Plt 280 13) 9/24-10/06/2019: IV feraheme 510mg  x 2 doses 14) 04/22/2021: Patient presented to emergency department with heavy nosebleeding.  Had a syncopal episode in the emergency department.  Discharged on 04/24/2021. Hgb 7.2 on admission.  15) 07/05/2021: presented to clinic with Hgb 6.2, MCV 88.9 and Plt 286. Admitted for GI workup.  16) 11/27/2021: colonscopy showed a single small localized angioectasia which was ablated. Small bowel endoscopy showed 5 angioectasias treated with APC  Interval History:  Lacey Vega 72 y.o. female with medical history  significant for iron deficiency anemia 2/2 to chronic blood loss/ sickle cell trait who presents for a follow up visit. The patient's last visit was on 10/30/2022. In the interim since the last visit Lacey Vega was admitted from 10/30-11/08/2022 due to severe anemia. She received 2 unit of PRBC and 1 x 300 mg dose of IV iron sucrose.   On exam today Lacey Vega notes.  She reports that she is not feeling good today.  She is having increased fatigue and tiredness.  She is not having any nausea, vomiting, or diarrhea and denies any overt signs of blood loss such as bleeding gums, nosebleeds, or blood in the urine/stool.  She reports he is not having any dark bowel movements.  She reports she is still taking her iron pills daily.  Her energy today is about a 2 out of 10.  Her appetite however has been pretty good.  She reports that her feet are somewhat swollen and more puffy than usual.  She is having increased shortness of breath and she feels like she is having increased back pain.  She is not having any lightheadedness or dizziness.  She is not currently having any infectious symptoms such as runny nose, sore throat, or cough.  She otherwise denies any fevers, chills, sweats, nausea, vomiting or diarrhea.  A full 10 point ROS is listed below.  MEDICAL HISTORY:  Past Medical History:  Diagnosis Date   Anemia    Anxiety    Asthma    Asthma    Bronchitis    hx   Colon polyps    hyperplastic   Diabetes (HCC)    mild, history  of   DJD (degenerative joint disease), cervical    Dysphagia    history of   Dyspnea    Gallstones    GERD (gastroesophageal reflux disease)    H/O chest pain    secondary to anemia   Headache    History of arteriovenous malformation (AVM)    History of blood transfusion 10/2021   History of hiatal hernia    History of tobacco use    Hyperlipemia    Hypertension    Hypothyroidism    Iron deficiency anemia    Sickle cell anemia (HCC)    "patient is not aware of this"    Thyroid disease    TIA (transient ischemic attack)    "was told that she possible had one"   Wears dentures    Wears glasses     SURGICAL HISTORY: Past Surgical History:  Procedure Laterality Date   ABDOMINAL AORTIC ENDOVASCULAR STENT GRAFT Bilateral 01/31/2021   Procedure: ENDOVASCULAR ANEURYSM REPAIR (EVAR)Bilateral Groin Cutdown, left femoral endaterectomy with bovine patch angioplasty.;  Surgeon: Leonie Douglas, MD;  Location: [facility] OR;  Service: Vascular;  Laterality: Bilateral;   ABDOMINAL HYSTERECTOMY     ANTERIOR CERVICAL DECOMP/DISCECTOMY FUSION N/A 10/03/2016   Procedure: ANTERIOR CERVICAL DECOMPRESSION/DISCECTOMY FUSION CERVICAL THREE- CERVICAL FOUR;  Surgeon: Coletta Memos, MD;  Location: [facility] OR;  Service: Neurosurgery;  Laterality: N/A;  Right side approach   BOTOX INJECTION  08/29/2015   Procedure: BOTOX INJECTION;  Surgeon: Charlott Rakes, MD;  Location: [facility] ENDOSCOPY;  Service: Endoscopy;;   CHOLECYSTECTOMY     COLONOSCOPY  12/03/2011   Procedure: COLONOSCOPY;  Surgeon: Shirley Friar, MD;  Location: [facility] ENDOSCOPY;  Service: Endoscopy;  Laterality: N/A;   COLONOSCOPY N/A 11/27/2021   Procedure: COLONOSCOPY;  Surgeon: Imogene Burn, MD;  Location: [facility] ENDOSCOPY;  Service: Gastroenterology;  Laterality: N/A;   ENTEROSCOPY N/A 07/07/2021   Procedure: ENTEROSCOPY;  Surgeon: Vida Rigger, MD;  Location: [facility] ENDOSCOPY;  Service: Endoscopy;  Laterality: N/A;   ENTEROSCOPY N/A 08/06/2021   Procedure: ENTEROSCOPY;  Surgeon: Kerin Salen, MD;  Location: [facility] ENDOSCOPY;  Service: Gastroenterology;  Laterality: N/A;   ENTEROSCOPY N/A 11/27/2021   Procedure: SMALL  BOWEL ENTEROSCOPY;  Surgeon: Imogene Burn, MD;  Location: [facility] ENDOSCOPY;  Service: Gastroenterology;  Laterality: N/A;   ENTEROSCOPY N/A 11/07/2022   Procedure: ENTEROSCOPY;  Surgeon: Lynann Bologna, MD;  Location: [facility] ENDOSCOPY;  Service: Gastroenterology;  Laterality: N/A;   ENTEROSCOPY N/A 04/03/2023   Procedure: ENTEROSCOPY;  Surgeon:  Iva Boop, MD;  Location: [facility] ENDOSCOPY;  Service: Gastroenterology;  Laterality: N/A;   ESOPHAGEAL MANOMETRY N/A 07/13/2017   Procedure: ESOPHAGEAL MANOMETRY (EM);  Surgeon: Charlott Rakes, MD;  Location: [facility] ENDOSCOPY;  Service: Endoscopy;  Laterality: N/A;   ESOPHAGOGASTRODUODENOSCOPY  12/03/2011   Procedure: ESOPHAGOGASTRODUODENOSCOPY (EGD);  Surgeon: Shirley Friar, MD;  Location: [facility] ENDOSCOPY;  Service: Endoscopy;  Laterality: N/A;   ESOPHAGOGASTRODUODENOSCOPY (EGD) WITH PROPOFOL N/A 08/29/2015   Procedure: ESOPHAGOGASTRODUODENOSCOPY (EGD) WITH PROPOFOL;  Surgeon: Charlott Rakes, MD;  Location: [facility] ENDOSCOPY;  Service: Endoscopy;  Laterality: N/A;   ESOPHAGOGASTRODUODENOSCOPY (EGD) WITH PROPOFOL N/A 05/17/2019   Procedure: ESOPHAGOGASTRODUODENOSCOPY (EGD) WITH PROPOFOL;  Surgeon: Charlott Rakes, MD;  Location: [facility] ENDOSCOPY;  Service: Endoscopy;  Laterality: N/A;   ESOPHAGOGASTRODUODENOSCOPY (EGD) WITH PROPOFOL N/A 04/24/2021   Procedure: ESOPHAGOGASTRODUODENOSCOPY (EGD) WITH PROPOFOL;  Surgeon: Kathi Der, MD;  Location: [facility] ENDOSCOPY;  Service: Gastroenterology;  Laterality: N/A;   GIVENS CAPSULE STUDY N/A 08/05/2018   Procedure: GIVENS CAPSULE STUDY;  Surgeon:  Charlott Rakes, MD;  Location: [facility] ENDOSCOPY;  Service: Endoscopy;  Laterality: N/A;   GIVENS CAPSULE STUDY N/A 07/06/2021   Procedure: GIVENS CAPSULE STUDY;  Surgeon: Vida Rigger, MD;  Location: [facility] ENDOSCOPY;  Service: Endoscopy;  Laterality: N/A;   HOT HEMOSTASIS  12/03/2011   Procedure: HOT HEMOSTASIS (ARGON PLASMA COAGULATION/BICAP);  Surgeon: Shirley Friar, MD;  Location: [facility] ENDOSCOPY;  Service: Endoscopy;  Laterality: N/A;   HOT HEMOSTASIS N/A 07/07/2021   Procedure: HOT HEMOSTASIS (ARGON PLASMA COAGULATION/BICAP);  Surgeon: Vida Rigger, MD;  Location: [facility] ENDOSCOPY;  Service: Endoscopy;  Laterality: N/A;   HOT HEMOSTASIS N/A 08/06/2021   Procedure: HOT HEMOSTASIS (ARGON PLASMA COAGULATION/BICAP);  Surgeon:  Kerin Salen, MD;  Location: [facility] ENDOSCOPY;  Service: Gastroenterology;  Laterality: N/A;   HOT HEMOSTASIS  11/27/2021   Procedure: HOT HEMOSTASIS (ARGON PLASMA COAGULATION/BICAP);  Surgeon: Imogene Burn, MD;  Location: [facility] ENDOSCOPY;  Service: Gastroenterology;;  EGD and COLON   HOT HEMOSTASIS N/A 11/07/2022   Procedure: HOT HEMOSTASIS (ARGON PLASMA COAGULATION/BICAP);  Surgeon: Lynann Bologna, MD;  Location: [facility] ENDOSCOPY;  Service: Gastroenterology;  Laterality: N/A;   HOT HEMOSTASIS N/A 04/03/2023   Procedure: HOT HEMOSTASIS (ARGON PLASMA COAGULATION/BICAP);  Surgeon: Iva Boop, MD;  Location: [facility] ENDOSCOPY;  Service: Gastroenterology;  Laterality: N/A;   NECK SURGERY     PERIPHERAL VASCULAR INTERVENTION  03/29/2021   Procedure: PERIPHERAL VASCULAR INTERVENTION;  Surgeon: Leonie Douglas, MD;  Location: [facility] INVASIVE CV LAB;  Service: Cardiovascular;;  Lt SFA Brachial Approach   POLYPECTOMY  11/27/2021   Procedure: POLYPECTOMY;  Surgeon: Imogene Burn, MD;  Location: [facility] ENDOSCOPY;  Service: Gastroenterology;;   ROTATOR CUFF REPAIR     right   ROTATOR CUFF REPAIR Right 2014   THROMBECTOMY FEMORAL ARTERY Left 01/31/2021   Procedure: THROMBECTOMY POPLITEAL  ARTERY;  Surgeon: Leonie Douglas, MD;  Location: [facility] OR;  Service: Vascular;  Laterality: Left;   TONSILLECTOMY      SOCIAL HISTORY: Social History   Socioeconomic History   Marital status: Divorced    Spouse name: Not on file   Number of children: 2   Years of education: Not on file   Highest education level: Not on file  Occupational History   Occupation: group leader environmental services    Employer: [employer]    Comment: [employer]  Tobacco Use   Smoking status: Former    Current packs/day: 0.00    Types: Cigarettes    Quit date: 06/02/2000    Years since quitting: 22.8   Smokeless tobacco: Never  Vaping Use   Vaping status: Never Used  Substance and Sexual Activity   Alcohol use: No   Drug use: No    Sexual activity: Not on file  Other Topics Concern   Not on file  Social History Narrative   ** Merged History Encounter **       Social Determinants of Health   Financial Resource Strain: Not on file  Food Insecurity: No Food Insecurity (04/05/2023)   Hunger Vital Sign    Worried About Running Out of Food in the Last Year: Never true    Ran Out of Food in the Last Year: Never true  Transportation Needs: No Transportation Needs (04/05/2023)   PRAPARE - Administrator, Civil Service (Medical): No    Lack of Transportation (Non-Medical): No  Physical Activity: Not on file  Stress: Not on file  Social Connections: Not on file  Intimate Partner Violence:  Not At Risk (04/05/2023)   Humiliation, Afraid, Rape, and Kick questionnaire    Fear of Current or Ex-Partner: No    Emotionally Abused: No    Physically Abused: No    Sexually Abused: No    FAMILY HISTORY: Family History  Problem Relation Age of Onset   Diabetes Mother    Heart attack Brother    Cancer Maternal Aunt     ALLERGIES:  is allergic to oxycodone.  MEDICATIONS:  Current Outpatient Medications  Medication Sig Dispense Refill   acetaminophen (TYLENOL) 500 MG tablet Take 500-1,000 mg by mouth every 6 (six) hours as needed for mild pain or headache.     albuterol (VENTOLIN HFA) 108 (90 Base) MCG/ACT inhaler Inhale 1-2 puffs into the lungs every 6 (six) hours as needed for wheezing or shortness of breath.     amoxicillin-clavulanate (AUGMENTIN) 500-125 MG tablet Take 1 tablet by mouth 2 (two) times daily. 8 tablet 0   ascorbic acid (VITAMIN C) 500 MG tablet Take 500 mg by mouth daily.     aspirin EC 81 MG tablet Take 1 tablet (81 mg total) by mouth daily. **Start on 11/25** (Patient taking differently: Take 81 mg by mouth in the morning.) 30 tablet 11   atorvastatin (LIPITOR) 40 MG tablet Take 40 mg by mouth at bedtime.     Biotin 2500 MCG CHEW Chew 5,000 mcg by mouth daily in the afternoon.     Cinnamon 500  MG TABS Take 1,000 mg by mouth every evening.     cyanocobalamin (VITAMIN B12) 1000 MCG tablet Take 1,000 mcg by mouth daily.     donepezil (ARICEPT) 5 MG tablet Take 5 mg by mouth at bedtime.     ferrous gluconate (FERGON) 324 MG tablet Take 324 mg by mouth daily with breakfast.     folic acid (FOLVITE) 800 MCG tablet Take 800 mcg by mouth daily.     levothyroxine (SYNTHROID) 125 MCG tablet Take 125 mcg by mouth See admin instructions. Take 125 mcg by mouth before breakfast on Mon/Tues/Wed/Thurs/Fri/Sat (take nothing on Sundays)     linaclotide (LINZESS) 145 MCG CAPS capsule Take 1 capsule (145 mcg total) by mouth daily before breakfast. (Patient taking differently: Take 145 mcg by mouth daily as needed (for constipation).) 30 capsule 6   montelukast (SINGULAIR) 10 MG tablet Take 10 mg by mouth daily in the afternoon.     nitroGLYCERIN (NITROSTAT) 0.4 MG SL tablet Place 0.4 mg under the tongue every 5 (five) minutes as needed for chest pain.     Omega-3 Fatty Acids (FISH OIL BURP-LESS) 1200 MG CAPS Take 1,200 mg by mouth daily in the afternoon.     pantoprazole (PROTONIX) 40 MG tablet Take 1 tablet (40 mg total) by mouth daily before breakfast. 90 tablet 1   WIXELA INHUB 250-50 MCG/ACT AEPB Inhale 1 puff into the lungs in the morning and at bedtime.     No current facility-administered medications for this visit.   Facility-Administered Medications Ordered in Other Visits  Medication Dose Route Frequency Provider Last Rate Last Admin   0.9 %  sodium chloride infusion   Intravenous Continuous Charlott Rakes, MD   Continued from Pre-op at 04/03/23 1244    REVIEW OF SYSTEMS:   Constitutional: ( - ) fevers, ( - )  chills , ( - ) night sweats (+) fatigue Eyes: ( - ) blurriness of vision, ( - ) double vision, ( - ) watery eyes Ears, nose, mouth, throat, and face: ( - )  mucositis, ( - ) sore throat Respiratory: ( - ) cough, ( - ) dyspnea, ( - ) wheezes Cardiovascular: ( - ) palpitation, ( - )  chest discomfort, ( - ) lower extremity swelling Gastrointestinal:  ( - ) nausea, ( - ) heartburn, ( - ) change in bowel habits Skin: ( - ) abnormal skin rashes Lymphatics: ( - ) new lymphadenopathy, ( - ) easy bruising Neurological: ( - ) numbness, ( - ) tingling, ( - ) new weaknesses Behavioral/Psych: ( - ) mood change, ( - ) new changes  All other systems were reviewed with the patient and are negative.  PHYSICAL EXAMINATION: ECOG PERFORMANCE STATUS: 1 - Symptomatic but completely ambulatory  Vitals:   04/09/23 0945  BP: (!) 132/59  Pulse: 67  Resp: 13  Temp: 98.2 F (36.8 C)  SpO2: 99%    Filed Weights   04/09/23 0945  Weight: 195 lb 3.2 oz (88.5 kg)     GENERAL: well appearing elderly African American female in NAD  SKIN: skin color, texture, turgor are normal, no rashes or significant lesions EYES: conjunctiva are pink and non-injected, sclera clear LUNGS: clear to auscultation and percussion with normal breathing effort HEART: regular rate & rhythm and no murmurs and no lower extremity edema Musculoskeletal: no cyanosis of digits and no clubbing  PSYCH: alert & oriented x 3, fluent speech NEURO: no focal motor/sensory deficits  LABORATORY DATA:  I have reviewed the data as listed    Latest Ref Rng & Units 04/09/2023    9:03 AM 04/05/2023    8:11 AM 04/05/2023    2:53 AM  CBC  WBC 4.0 - 10.5 K/uL 5.7   7.3   Hemoglobin 12.0 - 15.0 g/dL 9.0  8.2  8.4   Hematocrit 36.0 - 46.0 % 29.7  27.0  27.2   Platelets 150 - 400 K/uL 309   330        Latest Ref Rng & Units 04/09/2023    9:03 AM 04/05/2023    2:53 AM 04/04/2023    2:46 AM  CMP  Glucose 70 - 99 mg/dL 034  742  595   BUN 8 - 23 mg/dL 16  15  15    Creatinine 0.44 - 1.00 mg/dL 6.38  7.56  4.33   Sodium 135 - 145 mmol/L 146  144  140   Potassium 3.5 - 5.1 mmol/L 3.9  3.7  3.5   Chloride 98 - 111 mmol/L 112  112  107   CO2 22 - 32 mmol/L 30  24  26    Calcium 8.9 - 10.3 mg/dL 8.7  7.7  8.2   Total Protein 6.5  - 8.1 g/dL 6.4  6.2  5.9   Total Bilirubin <1.2 mg/dL 0.5  1.3  0.6   Alkaline Phos 38 - 126 U/L 92  78  77   AST 15 - 41 U/L 14  15  15    ALT 0 - 44 U/L 8  9  11      RADIOGRAPHIC STUDIES: None relevant to review.  US RENAL  Result Date: 04/04/2023 CLINICAL DATA:  AKI EXAM: RENAL / URINARY TRACT ULTRASOUND COMPLETE COMPARISON:  None Available. FINDINGS: Right Kidney: Renal measurements: 8.8 x 3.5 x 4.5 cm = volume: 75.5 mL. Echogenicity within normal limits. No mass or hydronephrosis visualized. Left Kidney: Renal measurements: 8.5 x 4.8 x 4.7 cm = volume: 99.5 mL. Echogenicity within normal limits. No mass or hydronephrosis visualized. Bladder: Appears normal for degree of bladder  distention. Other: Suboptimal images due to overlying bowel gas. IMPRESSION: No evidence of hydronephrosis. Electronically Signed   By: Allegra Lai M.D.   On: 04/04/2023 15:49   DG Chest 1 View  Result Date: 04/03/2023 CLINICAL DATA:  Cough. EXAM: CHEST  1 VIEW COMPARISON:  Radiographs 04/01/2023 and 05/12/2022. Chest CT 01/31/2021. FINDINGS: 1438 hours. The heart size and mediastinal contours are normal. The lungs are clear. There is no pleural effusion or pneumothorax. No acute osseous findings are identified. Previous lower cervical fusion noted. Telemetry leads overlie the chest. IMPRESSION: No evidence of acute cardiopulmonary process. Electronically Signed   By: Carey Bullocks M.D.   On: 04/03/2023 18:32   DG Lumbar Spine Complete  Result Date: 04/01/2023 CLINICAL DATA:  Chronic dark stools and back pain. EXAM: LUMBAR SPINE - COMPLETE 4+ VIEW COMPARISON:  01/29/2021 FINDINGS: There is transitional anatomy with sacralization of the L5 vertebral body. For the purpose of this report, the lowest formed disc space is designated L5-S1. No evidence of acute fracture or traumatic listhesis. Multilevel mild spondylosis and facet arthropathy. Aortoiliac endovascular stents and left femoral stent. Embolization coils  right hemipelvis. Cholecystectomy. IMPRESSION: No evidence of acute fracture or traumatic listhesis. Electronically Signed   By: Minerva Fester M.D.   On: 04/01/2023 19:49   DG Chest Port 1 View  Result Date: 04/01/2023 CLINICAL DATA:  Shortness of breath EXAM: PORTABLE CHEST 1 VIEW COMPARISON:  X-ray 05/12/2022. CT angiogram abdomen pelvis 07/20/2022. FINDINGS: Normal cardiopericardial silhouette. No consolidation, pneumothorax or effusion. No edema. Overlapping cardiac leads. Fixation hardware along the lower cervical spine at the edge of the imaging field. There is some enlargement of the central pulmonary arteries. IMPRESSION: No acute cardiopulmonary disease. Electronically Signed   By: Karen Kays M.D.   On: 04/01/2023 15:54    ASSESSMENT & PLAN Lacey Vega 72 y.o. female with medical history significant for iron deficiency anemia 2/2 to chronic blood loss presents for a follow up visit.  Ms. Reitmeier establish care with [place] in November 2020 and has been taking p.o. iron since that time.  When her levels failed to improve at her last visit she was started on IV Iron therapy and received IV feraheme 510mg  x 2 doses on 3/8 and 08/15/19. She received additional doses on 9/24 and 03/02/2020.  She had a hospital admission from 04/22/2021 until 04/24/2021 for epistaxis.  In clinic on 05/09/2019 the patient was found to have hemoglobin 6.3.  Transfusion was arranged for 2 units packed red blood cells on 05/09/2021.Transfusion was required again in Feb 2023 with admission. Additional 2 units of PRBC given on 10/14/2021.  She requires frequent admission for transfusion and IV iron.  She is being followed by gastroenterology and during her hospitalizations is often found to have small bowel AVMs.  #Iron Deficiency Anemia from Chronic Blood Loss --continue PO ferrous sulfide 325mg  daily with source of vitamin C. --source of blood loss previously identified as AVMs in the GI tract. Likely a strong  contribution from the patient's sickle cell trait preventing her from reaching a normal Hgb. Baseline Hgb is approximately 10.  --patient received IV feraheme 510mg  x 2 dose on 9/24-10/06/2019 and again on 11/12/2022 --received 5 doses of IV iron sucorse from 10/29/2021 to 11/26/2021.  --received 300 mg of IV venofer on 04/05/2023.  PLAN:  --transfusion goal for Hgb <7.0.  --labs today show WBC 5.7, Hgb 9.0, MCV 87.9, Plt 309 --RTC in 4 weeks with interval 2 week lab check   #Sickle  Cell Trait -- Hgb electrophoresis confirmed sickle cell trait on 07/27/2019. --like a strong contributing factor to the patients persistently low Hgb. Most likely her baseline hemoglobin is approximately 10.0   #Bleeding AVM/GI Bleeding --colonscopy and small bowel endoscopy performed on 11/27/2021 --small bowel endoscopy showed 5 angioectasias treated with APC --colonscopy showed 1 angioectasias --GI aware of her last Hgb drop.   No orders of the defined types were placed in this encounter.  All questions were answered. The patient knows to call the clinic with any problems, questions or concerns.  A total of more than 30 minutes were spent on this encounter and over half of that time was spent on counseling and coordination of care as outlined above.   Ulysees Barns, MD Department of Hematology/Oncology [facility] Phone: [phone] Pager: [phone] Email: [email or web address]  04/14/2023 4:37 PM

## 2023-04-14 ENCOUNTER — Encounter: Payer: Self-pay | Admitting: Hematology and Oncology

## 2023-04-23 ENCOUNTER — Other Ambulatory Visit: Payer: Self-pay | Admitting: Hematology and Oncology

## 2023-04-23 ENCOUNTER — Telehealth: Payer: Self-pay | Admitting: *Deleted

## 2023-04-23 ENCOUNTER — Inpatient Hospital Stay: Payer: Medicare HMO

## 2023-04-23 DIAGNOSIS — D500 Iron deficiency anemia secondary to blood loss (chronic): Secondary | ICD-10-CM | POA: Diagnosis not present

## 2023-04-23 LAB — CBC WITH DIFFERENTIAL (CANCER CENTER ONLY)
Abs Immature Granulocytes: 0.01 10*3/uL (ref 0.00–0.07)
Basophils Absolute: 0 10*3/uL (ref 0.0–0.1)
Basophils Relative: 0 %
Eosinophils Absolute: 0.3 10*3/uL (ref 0.0–0.5)
Eosinophils Relative: 5 %
HCT: 30.3 % — ABNORMAL LOW (ref 36.0–46.0)
Hemoglobin: 9.5 g/dL — ABNORMAL LOW (ref 12.0–15.0)
Immature Granulocytes: 0 %
Lymphocytes Relative: 18 %
Lymphs Abs: 0.9 10*3/uL (ref 0.7–4.0)
MCH: 27.1 pg (ref 26.0–34.0)
MCHC: 31.4 g/dL (ref 30.0–36.0)
MCV: 86.6 fL (ref 80.0–100.0)
Monocytes Absolute: 0.5 10*3/uL (ref 0.1–1.0)
Monocytes Relative: 10 %
Neutro Abs: 3.4 10*3/uL (ref 1.7–7.7)
Neutrophils Relative %: 67 %
Platelet Count: 288 10*3/uL (ref 150–400)
RBC: 3.5 MIL/uL — ABNORMAL LOW (ref 3.87–5.11)
RDW: 15.9 % — ABNORMAL HIGH (ref 11.5–15.5)
WBC Count: 5.1 10*3/uL (ref 4.0–10.5)
nRBC: 0 % (ref 0.0–0.2)

## 2023-04-23 LAB — RETICULOCYTES
Immature Retic Fract: 18.2 % — ABNORMAL HIGH (ref 2.3–15.9)
RBC.: 3.58 MIL/uL — ABNORMAL LOW (ref 3.87–5.11)
Retic Count, Absolute: 61.2 10*3/uL (ref 19.0–186.0)
Retic Ct Pct: 1.7 % (ref 0.4–3.1)

## 2023-04-23 LAB — CMP (CANCER CENTER ONLY)
ALT: 6 U/L (ref 0–44)
AST: 12 U/L — ABNORMAL LOW (ref 15–41)
Albumin: 3.8 g/dL (ref 3.5–5.0)
Alkaline Phosphatase: 114 U/L (ref 38–126)
Anion gap: 5 (ref 5–15)
BUN: 10 mg/dL (ref 8–23)
CO2: 30 mmol/L (ref 22–32)
Calcium: 9.1 mg/dL (ref 8.9–10.3)
Chloride: 112 mmol/L — ABNORMAL HIGH (ref 98–111)
Creatinine: 1.01 mg/dL — ABNORMAL HIGH (ref 0.44–1.00)
GFR, Estimated: 59 mL/min — ABNORMAL LOW (ref >60)
Glucose, Bld: 93 mg/dL (ref 70–99)
Potassium: 3.3 mmol/L — ABNORMAL LOW (ref 3.5–5.1)
Sodium: 147 mmol/L — ABNORMAL HIGH (ref 135–145)
Total Bilirubin: 0.8 mg/dL (ref <1.2)
Total Protein: 6.6 g/dL (ref 6.5–8.1)

## 2023-04-23 LAB — IRON AND IRON BINDING CAPACITY (CC-WL,HP ONLY)
Iron: 44 ug/dL (ref 28–170)
Saturation Ratios: 14 % (ref 10.4–31.8)
TIBC: 319 ug/dL (ref 250–450)
UIBC: 275 ug/dL (ref 148–442)

## 2023-04-23 LAB — SAMPLE TO BLOOD BANK

## 2023-04-23 LAB — FERRITIN: Ferritin: 152 ng/mL (ref 11–307)

## 2023-04-23 NOTE — Telephone Encounter (Signed)
TCT patient regarding today's CBC. Spoke with her. Advised that her HGB is better than the last check on 04/09/23. Advised that it is now 9.5. Pt states she feels well.  No further questions or concerns.

## 2023-05-01 ENCOUNTER — Other Ambulatory Visit: Payer: Self-pay | Admitting: Vascular Surgery

## 2023-05-01 ENCOUNTER — Other Ambulatory Visit: Payer: Self-pay | Admitting: *Deleted

## 2023-05-01 DIAGNOSIS — D500 Iron deficiency anemia secondary to blood loss (chronic): Secondary | ICD-10-CM

## 2023-05-05 ENCOUNTER — Inpatient Hospital Stay (HOSPITAL_BASED_OUTPATIENT_CLINIC_OR_DEPARTMENT_OTHER): Payer: Medicare HMO | Admitting: Hematology and Oncology

## 2023-05-05 ENCOUNTER — Inpatient Hospital Stay: Payer: Medicare HMO | Attending: Hematology and Oncology

## 2023-05-05 VITALS — BP 156/66 | HR 80 | Temp 97.3°F | Resp 16 | Wt 194.6 lb

## 2023-05-05 DIAGNOSIS — D500 Iron deficiency anemia secondary to blood loss (chronic): Secondary | ICD-10-CM

## 2023-05-05 DIAGNOSIS — Z87891 Personal history of nicotine dependence: Secondary | ICD-10-CM | POA: Diagnosis not present

## 2023-05-05 DIAGNOSIS — D573 Sickle-cell trait: Secondary | ICD-10-CM | POA: Diagnosis not present

## 2023-05-05 LAB — RETIC PANEL
Immature Retic Fract: 19.8 % — ABNORMAL HIGH (ref 2.3–15.9)
RBC.: 3.49 MIL/uL — ABNORMAL LOW (ref 3.87–5.11)
Retic Count, Absolute: 61.8 10*3/uL (ref 19.0–186.0)
Retic Ct Pct: 1.8 % (ref 0.4–3.1)
Reticulocyte Hemoglobin: 27.4 pg — ABNORMAL LOW (ref >27.9)

## 2023-05-05 LAB — CBC WITH DIFFERENTIAL (CANCER CENTER ONLY)
Abs Immature Granulocytes: 0.01 10*3/uL (ref 0.00–0.07)
Basophils Absolute: 0 10*3/uL (ref 0.0–0.1)
Basophils Relative: 1 %
Eosinophils Absolute: 0.2 10*3/uL (ref 0.0–0.5)
Eosinophils Relative: 4 %
HCT: 30.9 % — ABNORMAL LOW (ref 36.0–46.0)
Hemoglobin: 9.6 g/dL — ABNORMAL LOW (ref 12.0–15.0)
Immature Granulocytes: 0 %
Lymphocytes Relative: 17 %
Lymphs Abs: 0.9 10*3/uL (ref 0.7–4.0)
MCH: 26.7 pg (ref 26.0–34.0)
MCHC: 31.1 g/dL (ref 30.0–36.0)
MCV: 85.8 fL (ref 80.0–100.0)
Monocytes Absolute: 0.5 10*3/uL (ref 0.1–1.0)
Monocytes Relative: 9 %
Neutro Abs: 3.6 10*3/uL (ref 1.7–7.7)
Neutrophils Relative %: 69 %
Platelet Count: 250 10*3/uL (ref 150–400)
RBC: 3.6 MIL/uL — ABNORMAL LOW (ref 3.87–5.11)
RDW: 15.9 % — ABNORMAL HIGH (ref 11.5–15.5)
WBC Count: 5.2 10*3/uL (ref 4.0–10.5)
nRBC: 0 % (ref 0.0–0.2)

## 2023-05-05 LAB — IRON AND IRON BINDING CAPACITY (CC-WL,HP ONLY)
Iron: 44 ug/dL (ref 28–170)
Saturation Ratios: 13 % (ref 10.4–31.8)
TIBC: 330 ug/dL (ref 250–450)
UIBC: 286 ug/dL (ref 148–442)

## 2023-05-05 LAB — CMP (CANCER CENTER ONLY)
ALT: 6 U/L (ref 0–44)
AST: 12 U/L — ABNORMAL LOW (ref 15–41)
Albumin: 4 g/dL (ref 3.5–5.0)
Alkaline Phosphatase: 102 U/L (ref 38–126)
Anion gap: 5 (ref 5–15)
BUN: 13 mg/dL (ref 8–23)
CO2: 32 mmol/L (ref 22–32)
Calcium: 9.4 mg/dL (ref 8.9–10.3)
Chloride: 109 mmol/L (ref 98–111)
Creatinine: 1.08 mg/dL — ABNORMAL HIGH (ref 0.44–1.00)
GFR, Estimated: 54 mL/min — ABNORMAL LOW (ref >60)
Glucose, Bld: 98 mg/dL (ref 70–99)
Potassium: 3.7 mmol/L (ref 3.5–5.1)
Sodium: 146 mmol/L — ABNORMAL HIGH (ref 135–145)
Total Bilirubin: 0.9 mg/dL (ref <1.2)
Total Protein: 6.8 g/dL (ref 6.5–8.1)

## 2023-05-05 LAB — SAMPLE TO BLOOD BANK

## 2023-05-05 LAB — FERRITIN: Ferritin: 103 ng/mL (ref 11–307)

## 2023-05-05 NOTE — Progress Notes (Signed)
[facility] [phone]   [phone]  PROGRESS NOTE  Patient Care Team: Gwenyth Bender, MD as PCP - General (Internal Medicine)  Hematological/Oncological History # Iron Deficiency Anemia due to Chronic Blood Loss # Sickle Cell Trait  1) 03/26/2009: Hgb 7.2, first Hgb on record 2) 09/02/2011: 2 units of PRBC at [facility] (Hgb noted at 8.9).  3)  12/03/2011: colonoscopy, found colon polyp and sigmoid diverticula. 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 Underwent argon plasma coagulation  4) Jan 2018: colonoscopy with 2 tubular adenomas removed. Small colonic AVM and small internal hemorrhoids.  5)10/24/2017: Hgb 7.7, MCV 71, Plt 338, and WBC 6.7. POEM procedure for Achalasia. duodenal AVM fulgurated.  6) 03/2018: Hgb 8.2.  7) March 2020: capsule endoscopy: nonbleeding gastric and proximal small bowel AVM,  8) 04/26/2019: Establish Care with Dr. Leonides Schanz  WBC 6.1, Hgb 9.3, MCV 75.3, Plt 289 9) 05/17/2019: Underwent EGD which showed esophagitis and small non-bleeding duodenal angiodysplasias. 10) 07/27/2019: WBC 5.9, Hgb 9.8, MCV 77.8, Plt 271. Recommend IV iron based on labs. Received feraheme 510mg  IV q7 days x 2 doses on 3/8 and 08/15/2019. Hgb electrophoresis confirms sickle cell trait.  11) 10/24/2019: WBC 5.4, Hgb 10.2, MCV 80.5, Plt 252 12) 02/15/2020: WBC 5.1, Hgb 9.7, MCV 80.2, Plt 280 13) 9/24-10/06/2019: IV feraheme 510mg  x 2 doses 14) 04/22/2021: Patient presented to emergency department with heavy nosebleeding.  Had a syncopal episode in the emergency department.  Discharged on 04/24/2021. Hgb 7.2 on admission.  15) 07/05/2021: presented to clinic with Hgb 6.2, MCV 88.9 and Plt 286. Admitted for GI workup.  16) 11/27/2021: colonscopy showed a single small localized angioectasia which was ablated. Small bowel endoscopy showed 5 angioectasias treated with APC  Interval History:  Lacey Vega 72 y.o. female with medical history  significant for iron deficiency anemia 2/2 to chronic blood loss/ sickle cell trait who presents for a follow up visit. The patient's last visit was on 04/09/2023. In the interim since the last visit she has had no major changes in her health..   On exam today Lacey Vega notes he did not do any special activities for Thanksgiving and had no special meal.  She reports that she is continuing to struggle with shortness of breath and tiredness.  She reports overall she feels "about the same".  She is not having any runny nose, sore throat, or cough.  She denies any overt sources of bleeding other than some occasional nosebleeding.  She reports she does have dark stools but is taking iron pills.  The iron pills or not causing any stomach upset/constipation, nausea, or vomiting.  She reports she has had no other changes in her health though she does "stay cold".  She otherwise denies any fevers, chills, sweats, nausea, vomiting or diarrhea.  A full 10 point ROS is listed below.  MEDICAL HISTORY:  Past Medical History:  Diagnosis Date   Anemia    Anxiety    Asthma    Asthma    Bronchitis    hx   Colon polyps    hyperplastic   Diabetes (HCC)    mild, history of   DJD (degenerative joint disease), cervical    Dysphagia    history of   Dyspnea    Gallstones    GERD (gastroesophageal reflux disease)    H/O chest pain    secondary to anemia   Headache    History of arteriovenous malformation (AVM)  History of blood transfusion 10/2021   History of hiatal hernia    History of tobacco use    Hyperlipemia    Hypertension    Hypothyroidism    Iron deficiency anemia    Sickle cell anemia (HCC)    "patient is not aware of this"   Thyroid disease    TIA (transient ischemic attack)    "was told that she possible had one"   Wears dentures    Wears glasses     SURGICAL HISTORY: Past Surgical History:  Procedure Laterality Date   ABDOMINAL AORTIC ENDOVASCULAR STENT GRAFT Bilateral 01/31/2021    Procedure: ENDOVASCULAR ANEURYSM REPAIR (EVAR)Bilateral Groin Cutdown, left femoral endaterectomy with bovine patch angioplasty.;  Surgeon: Leonie Douglas, MD;  Location: [facility] OR;  Service: Vascular;  Laterality: Bilateral;   ABDOMINAL HYSTERECTOMY     ANTERIOR CERVICAL DECOMP/DISCECTOMY FUSION N/A 10/03/2016   Procedure: ANTERIOR CERVICAL DECOMPRESSION/DISCECTOMY FUSION CERVICAL THREE- CERVICAL FOUR;  Surgeon: Coletta Memos, MD;  Location: [facility] OR;  Service: Neurosurgery;  Laterality: N/A;  Right side approach   BOTOX INJECTION  08/29/2015   Procedure: BOTOX INJECTION;  Surgeon: Charlott Rakes, MD;  Location: [facility] ENDOSCOPY;  Service: Endoscopy;;   CHOLECYSTECTOMY     COLONOSCOPY  12/03/2011   Procedure: COLONOSCOPY;  Surgeon: Shirley Friar, MD;  Location: [facility] ENDOSCOPY;  Service: Endoscopy;  Laterality: N/A;   COLONOSCOPY N/A 11/27/2021   Procedure: COLONOSCOPY;  Surgeon: Imogene Burn, MD;  Location: [facility] ENDOSCOPY;  Service: Gastroenterology;  Laterality: N/A;   ENTEROSCOPY N/A 07/07/2021   Procedure: ENTEROSCOPY;  Surgeon: Vida Rigger, MD;  Location: [facility] ENDOSCOPY;  Service: Endoscopy;  Laterality: N/A;   ENTEROSCOPY N/A 08/06/2021   Procedure: ENTEROSCOPY;  Surgeon: Kerin Salen, MD;  Location: [facility] ENDOSCOPY;  Service: Gastroenterology;  Laterality: N/A;   ENTEROSCOPY N/A 11/27/2021   Procedure: SMALL  BOWEL ENTEROSCOPY;  Surgeon: Imogene Burn, MD;  Location: [facility] ENDOSCOPY;  Service: Gastroenterology;  Laterality: N/A;   ENTEROSCOPY N/A 11/07/2022   Procedure: ENTEROSCOPY;  Surgeon: Lynann Bologna, MD;  Location: [facility] ENDOSCOPY;  Service: Gastroenterology;  Laterality: N/A;   ENTEROSCOPY N/A 04/03/2023   Procedure: ENTEROSCOPY;  Surgeon: Iva Boop, MD;  Location: [facility] ENDOSCOPY;  Service: Gastroenterology;  Laterality: N/A;   ESOPHAGEAL MANOMETRY N/A 07/13/2017   Procedure: ESOPHAGEAL MANOMETRY (EM);  Surgeon: Charlott Rakes, MD;  Location: [facility] ENDOSCOPY;  Service: Endoscopy;  Laterality: N/A;    ESOPHAGOGASTRODUODENOSCOPY  12/03/2011   Procedure: ESOPHAGOGASTRODUODENOSCOPY (EGD);  Surgeon: Shirley Friar, MD;  Location: [facility] ENDOSCOPY;  Service: Endoscopy;  Laterality: N/A;   ESOPHAGOGASTRODUODENOSCOPY (EGD) WITH PROPOFOL N/A 08/29/2015   Procedure: ESOPHAGOGASTRODUODENOSCOPY (EGD) WITH PROPOFOL;  Surgeon: Charlott Rakes, MD;  Location: [facility] ENDOSCOPY;  Service: Endoscopy;  Laterality: N/A;   ESOPHAGOGASTRODUODENOSCOPY (EGD) WITH PROPOFOL N/A 05/17/2019   Procedure: ESOPHAGOGASTRODUODENOSCOPY (EGD) WITH PROPOFOL;  Surgeon: Charlott Rakes, MD;  Location: [facility] ENDOSCOPY;  Service: Endoscopy;  Laterality: N/A;   ESOPHAGOGASTRODUODENOSCOPY (EGD) WITH PROPOFOL N/A 04/24/2021   Procedure: ESOPHAGOGASTRODUODENOSCOPY (EGD) WITH PROPOFOL;  Surgeon: Kathi Der, MD;  Location: [facility] ENDOSCOPY;  Service: Gastroenterology;  Laterality: N/A;   GIVENS CAPSULE STUDY N/A 08/05/2018   Procedure: GIVENS CAPSULE STUDY;  Surgeon: Charlott Rakes, MD;  Location: [facility] ENDOSCOPY;  Service: Endoscopy;  Laterality: N/A;   GIVENS CAPSULE STUDY N/A 07/06/2021   Procedure: GIVENS CAPSULE STUDY;  Surgeon: Vida Rigger, MD;  Location: [facility] ENDOSCOPY;  Service: Endoscopy;  Laterality: N/A;   HOT HEMOSTASIS  12/03/2011   Procedure: HOT HEMOSTASIS (ARGON PLASMA COAGULATION/BICAP);  Surgeon:  Shirley Friar, MD;  Location: [facility] ENDOSCOPY;  Service: Endoscopy;  Laterality: N/A;   HOT HEMOSTASIS N/A 07/07/2021   Procedure: HOT HEMOSTASIS (ARGON PLASMA COAGULATION/BICAP);  Surgeon: Vida Rigger, MD;  Location: [facility] ENDOSCOPY;  Service: Endoscopy;  Laterality: N/A;   HOT HEMOSTASIS N/A 08/06/2021   Procedure: HOT HEMOSTASIS (ARGON PLASMA COAGULATION/BICAP);  Surgeon: Kerin Salen, MD;  Location: [facility] ENDOSCOPY;  Service: Gastroenterology;  Laterality: N/A;   HOT HEMOSTASIS  11/27/2021   Procedure: HOT HEMOSTASIS (ARGON PLASMA COAGULATION/BICAP);  Surgeon: Imogene Burn, MD;  Location: [facility] ENDOSCOPY;  Service: Gastroenterology;;  EGD and  COLON   HOT HEMOSTASIS N/A 11/07/2022   Procedure: HOT HEMOSTASIS (ARGON PLASMA COAGULATION/BICAP);  Surgeon: Lynann Bologna, MD;  Location: [facility] ENDOSCOPY;  Service: Gastroenterology;  Laterality: N/A;   HOT HEMOSTASIS N/A 04/03/2023   Procedure: HOT HEMOSTASIS (ARGON PLASMA COAGULATION/BICAP);  Surgeon: Iva Boop, MD;  Location: [facility] ENDOSCOPY;  Service: Gastroenterology;  Laterality: N/A;   NECK SURGERY     PERIPHERAL VASCULAR INTERVENTION  03/29/2021   Procedure: PERIPHERAL VASCULAR INTERVENTION;  Surgeon: Leonie Douglas, MD;  Location: [facility] INVASIVE CV LAB;  Service: Cardiovascular;;  Lt SFA Brachial Approach   POLYPECTOMY  11/27/2021   Procedure: POLYPECTOMY;  Surgeon: Imogene Burn, MD;  Location: [facility] ENDOSCOPY;  Service: Gastroenterology;;   ROTATOR CUFF REPAIR     right   ROTATOR CUFF REPAIR Right 2014   THROMBECTOMY FEMORAL ARTERY Left 01/31/2021   Procedure: THROMBECTOMY POPLITEAL  ARTERY;  Surgeon: Leonie Douglas, MD;  Location: [facility] OR;  Service: Vascular;  Laterality: Left;   TONSILLECTOMY      SOCIAL HISTORY: Social History   Socioeconomic History   Marital status: Divorced    Spouse name: Not on file   Number of children: 2   Years of education: Not on file   Highest education level: Not on file  Occupational History   Occupation: group leader environmental services    Employer: [employer]    Comment: [employer]  Tobacco Use   Smoking status: Former    Current packs/day: 0.00    Types: Cigarettes    Quit date: 06/02/2000    Years since quitting: 22.9   Smokeless tobacco: Never  Vaping Use   Vaping status: Never Used  Substance and Sexual Activity   Alcohol use: No   Drug use: No   Sexual activity: Not on file  Other Topics Concern   Not on file  Social History Narrative   ** Merged History Encounter **       Social Determinants of Health   Financial Resource Strain: Not on file  Food Insecurity: No Food Insecurity (04/05/2023)   Hunger  Vital Sign    Worried About Running Out of Food in the Last Year: Never true    Ran Out of Food in the Last Year: Never true  Transportation Needs: No Transportation Needs (04/05/2023)   PRAPARE - Administrator, Civil Service (Medical): No    Lack of Transportation (Non-Medical): No  Physical Activity: Not on file  Stress: Not on file  Social Connections: Not on file  Intimate Partner Violence: Not At Risk (04/05/2023)   Humiliation, Afraid, Rape, and Kick questionnaire    Fear of Current or Ex-Partner: No    Emotionally Abused: No    Physically Abused: No    Sexually Abused: No    FAMILY HISTORY: Family History  Problem Relation Age of Onset   Diabetes Mother  Heart attack Brother    Cancer Maternal Aunt     ALLERGIES:  is allergic to oxycodone.  MEDICATIONS:  Current Outpatient Medications  Medication Sig Dispense Refill   acetaminophen (TYLENOL) 500 MG tablet Take 500-1,000 mg by mouth every 6 (six) hours as needed for mild pain or headache.     albuterol (VENTOLIN HFA) 108 (90 Base) MCG/ACT inhaler Inhale 1-2 puffs into the lungs every 6 (six) hours as needed for wheezing or shortness of breath.     amoxicillin-clavulanate (AUGMENTIN) 500-125 MG tablet Take 1 tablet by mouth 2 (two) times daily. 8 tablet 0   ascorbic acid (VITAMIN C) 500 MG tablet Take 500 mg by mouth daily.     aspirin EC 81 MG tablet Take 1 tablet (81 mg total) by mouth daily. **Start on 11/25** (Patient taking differently: Take 81 mg by mouth in the morning.) 30 tablet 11   atorvastatin (LIPITOR) 40 MG tablet Take 40 mg by mouth at bedtime.     Biotin 2500 MCG CHEW Chew 5,000 mcg by mouth daily in the afternoon.     Cinnamon 500 MG TABS Take 1,000 mg by mouth every evening.     clopidogrel (PLAVIX) 75 MG tablet TAKE 1 TABLET BY MOUTH EVERY DAY 90 tablet 3   cyanocobalamin (VITAMIN B12) 1000 MCG tablet Take 1,000 mcg by mouth daily.     donepezil (ARICEPT) 5 MG tablet Take 5 mg by mouth at  bedtime.     ferrous gluconate (FERGON) 324 MG tablet Take 324 mg by mouth daily with breakfast.     folic acid (FOLVITE) 800 MCG tablet Take 800 mcg by mouth daily.     levothyroxine (SYNTHROID) 125 MCG tablet Take 125 mcg by mouth See admin instructions. Take 125 mcg by mouth before breakfast on Mon/Tues/Wed/Thurs/Fri/Sat (take nothing on Sundays)     linaclotide (LINZESS) 145 MCG CAPS capsule Take 1 capsule (145 mcg total) by mouth daily before breakfast. (Patient taking differently: Take 145 mcg by mouth daily as needed (for constipation).) 30 capsule 6   montelukast (SINGULAIR) 10 MG tablet Take 10 mg by mouth daily in the afternoon.     nitroGLYCERIN (NITROSTAT) 0.4 MG SL tablet Place 0.4 mg under the tongue every 5 (five) minutes as needed for chest pain.     Omega-3 Fatty Acids (FISH OIL BURP-LESS) 1200 MG CAPS Take 1,200 mg by mouth daily in the afternoon.     pantoprazole (PROTONIX) 40 MG tablet Take 1 tablet (40 mg total) by mouth daily before breakfast. 90 tablet 1   WIXELA INHUB 250-50 MCG/ACT AEPB Inhale 1 puff into the lungs in the morning and at bedtime.     No current facility-administered medications for this visit.   Facility-Administered Medications Ordered in Other Visits  Medication Dose Route Frequency Provider Last Rate Last Admin   0.9 %  sodium chloride infusion   Intravenous Continuous Charlott Rakes, MD   Continued from Pre-op at 04/03/23 1244    REVIEW OF SYSTEMS:   Constitutional: ( - ) fevers, ( - )  chills , ( - ) night sweats (+) fatigue Eyes: ( - ) blurriness of vision, ( - ) double vision, ( - ) watery eyes Ears, nose, mouth, throat, and face: ( - ) mucositis, ( - ) sore throat Respiratory: ( - ) cough, ( - ) dyspnea, ( - ) wheezes Cardiovascular: ( - ) palpitation, ( - ) chest discomfort, ( - ) lower extremity swelling Gastrointestinal:  ( - )  nausea, ( - ) heartburn, ( - ) change in bowel habits Skin: ( - ) abnormal skin rashes Lymphatics: ( - ) new  lymphadenopathy, ( - ) easy bruising Neurological: ( - ) numbness, ( - ) tingling, ( - ) new weaknesses Behavioral/Psych: ( - ) mood change, ( - ) new changes  All other systems were reviewed with the patient and are negative.  PHYSICAL EXAMINATION: ECOG PERFORMANCE STATUS: 1 - Symptomatic but completely ambulatory  Vitals:   05/05/23 1243 05/05/23 1251  BP: (!) 170/77 (!) 156/66  Pulse: 80   Resp: 16   Temp: (!) 97.3 F (36.3 C)   SpO2: 98%      Filed Weights   05/05/23 1243  Weight: 194 lb 9.6 oz (88.3 kg)      GENERAL: well appearing elderly African American female in NAD  SKIN: skin color, texture, turgor are normal, no rashes or significant lesions EYES: conjunctiva are pink and non-injected, sclera clear LUNGS: clear to auscultation and percussion with normal breathing effort HEART: regular rate & rhythm and no murmurs and no lower extremity edema Musculoskeletal: no cyanosis of digits and no clubbing  PSYCH: alert & oriented x 3, fluent speech NEURO: no focal motor/sensory deficits  LABORATORY DATA:  I have reviewed the data as listed    Latest Ref Rng & Units 05/05/2023   12:24 PM 04/23/2023   10:25 AM 04/09/2023    9:03 AM  CBC  WBC 4.0 - 10.5 K/uL 5.2  5.1  5.7   Hemoglobin 12.0 - 15.0 g/dL 9.6  9.5  9.0   Hematocrit 36.0 - 46.0 % 30.9  30.3  29.7   Platelets 150 - 400 K/uL 250  288  309        Latest Ref Rng & Units 05/05/2023   12:24 PM 04/23/2023   10:25 AM 04/09/2023    9:03 AM  CMP  Glucose 70 - 99 mg/dL 98  93  829   BUN 8 - 23 mg/dL 13  10  16    Creatinine 0.44 - 1.00 mg/dL 5.62  1.30  8.65   Sodium 135 - 145 mmol/L 146  147  146   Potassium 3.5 - 5.1 mmol/L 3.7  3.3  3.9   Chloride 98 - 111 mmol/L 109  112  112   CO2 22 - 32 mmol/L 32  30  30   Calcium 8.9 - 10.3 mg/dL 9.4  9.1  8.7   Total Protein 6.5 - 8.1 g/dL 6.8  6.6  6.4   Total Bilirubin <1.2 mg/dL 0.9  0.8  0.5   Alkaline Phos 38 - 126 U/L 102  114  92   AST 15 - 41 U/L 12  12   14    ALT 0 - 44 U/L 6  6  8      RADIOGRAPHIC STUDIES: None relevant to review.  No results found.  ASSESSMENT & PLAN Lacey Vega 72 y.o. female with medical history significant for iron deficiency anemia 2/2 to chronic blood loss presents for a follow up visit.  Lacey Vega establish care with [place] in November 2020 and has been taking p.o. iron since that time.  When her levels failed to improve at her last visit she was started on IV Iron therapy and received IV feraheme 510mg  x 2 doses on 3/8 and 08/15/19. She received additional doses on 9/24 and 03/02/2020.  She had a hospital admission from 04/22/2021 until 04/24/2021 for epistaxis.  In clinic on 05/09/2019  the patient was found to have hemoglobin 6.3.  Transfusion was arranged for 2 units packed red blood cells on 05/09/2021.Transfusion was required again in Feb 2023 with admission. Additional 2 units of PRBC given on 10/14/2021.  She requires frequent admission for transfusion and IV iron.  She is being followed by gastroenterology and during her hospitalizations is often found to have small bowel AVMs.  #Iron Deficiency Anemia from Chronic Blood Loss --continue PO ferrous sulfide 325mg  daily with source of vitamin C. --source of blood loss previously identified as AVMs in the GI tract. Likely a strong contribution from the patient's sickle cell trait preventing her from reaching a normal Hgb. Baseline Hgb is approximately 10.  --patient received IV feraheme 510mg  x 2 dose on 9/24-10/06/2019 and again on 11/12/2022 --received 5 doses of IV iron sucorse from 10/29/2021 to 11/26/2021.  --received 300 mg of IV venofer on 04/05/2023.  PLAN:  --transfusion goal for Hgb <7.0.  --labs today show WBC 5.2, hemoglobin 9.6, MCV 85.8, platelets 250 --RTC in 8 weeks with interval 2 week lab checks   #Sickle Cell Trait -- Hgb electrophoresis confirmed sickle cell trait on 07/27/2019. --like a strong contributing factor to the patients persistently low Hgb.  Most likely her baseline hemoglobin is approximately 10.0   #Bleeding AVM/GI Bleeding --colonscopy and small bowel endoscopy performed on 11/27/2021 --small bowel endoscopy showed 5 angioectasias treated with APC --colonscopy showed 1 angioectasias --GI aware of her last Hgb drop.   No orders of the defined types were placed in this encounter.  All questions were answered. The patient knows to call the clinic with any problems, questions or concerns.  A total of more than 30 minutes were spent on this encounter and over half of that time was spent on counseling and coordination of care as outlined above.   Lacey Barns, MD Department of Hematology/Oncology [facility] Phone: [phone] Pager: [phone] Email: [email or web address]  05/05/2023 2:55 PM

## 2023-05-19 ENCOUNTER — Other Ambulatory Visit: Payer: Self-pay | Admitting: Hematology and Oncology

## 2023-05-19 ENCOUNTER — Inpatient Hospital Stay: Payer: Medicare HMO

## 2023-05-19 DIAGNOSIS — D500 Iron deficiency anemia secondary to blood loss (chronic): Secondary | ICD-10-CM

## 2023-05-19 LAB — FERRITIN: Ferritin: 62 ng/mL (ref 11–307)

## 2023-05-19 LAB — CBC WITH DIFFERENTIAL (CANCER CENTER ONLY)
Abs Immature Granulocytes: 0.01 10*3/uL (ref 0.00–0.07)
Basophils Absolute: 0 10*3/uL (ref 0.0–0.1)
Basophils Relative: 1 %
Eosinophils Absolute: 0.3 10*3/uL (ref 0.0–0.5)
Eosinophils Relative: 4 %
HCT: 25.4 % — ABNORMAL LOW (ref 36.0–46.0)
Hemoglobin: 8.1 g/dL — ABNORMAL LOW (ref 12.0–15.0)
Immature Granulocytes: 0 %
Lymphocytes Relative: 22 %
Lymphs Abs: 1.5 10*3/uL (ref 0.7–4.0)
MCH: 27.1 pg (ref 26.0–34.0)
MCHC: 31.9 g/dL (ref 30.0–36.0)
MCV: 84.9 fL (ref 80.0–100.0)
Monocytes Absolute: 0.5 10*3/uL (ref 0.1–1.0)
Monocytes Relative: 7 %
Neutro Abs: 4.3 10*3/uL (ref 1.7–7.7)
Neutrophils Relative %: 66 %
Platelet Count: 279 10*3/uL (ref 150–400)
RBC: 2.99 MIL/uL — ABNORMAL LOW (ref 3.87–5.11)
RDW: 15.7 % — ABNORMAL HIGH (ref 11.5–15.5)
WBC Count: 6.6 10*3/uL (ref 4.0–10.5)
nRBC: 0 % (ref 0.0–0.2)

## 2023-05-19 LAB — IRON AND IRON BINDING CAPACITY (CC-WL,HP ONLY)
Iron: 48 ug/dL (ref 28–170)
Saturation Ratios: 14 % (ref 10.4–31.8)
TIBC: 336 ug/dL (ref 250–450)
UIBC: 288 ug/dL (ref 148–442)

## 2023-05-19 LAB — CMP (CANCER CENTER ONLY)
ALT: 6 U/L (ref 0–44)
AST: 12 U/L — ABNORMAL LOW (ref 15–41)
Albumin: 3.9 g/dL (ref 3.5–5.0)
Alkaline Phosphatase: 88 U/L (ref 38–126)
Anion gap: 5 (ref 5–15)
BUN: 24 mg/dL — ABNORMAL HIGH (ref 8–23)
CO2: 28 mmol/L (ref 22–32)
Calcium: 8.9 mg/dL (ref 8.9–10.3)
Chloride: 109 mmol/L (ref 98–111)
Creatinine: 1.07 mg/dL — ABNORMAL HIGH (ref 0.44–1.00)
GFR, Estimated: 55 mL/min — ABNORMAL LOW (ref >60)
Glucose, Bld: 100 mg/dL — ABNORMAL HIGH (ref 70–99)
Potassium: 3.9 mmol/L (ref 3.5–5.1)
Sodium: 142 mmol/L (ref 135–145)
Total Bilirubin: 0.7 mg/dL (ref <1.2)
Total Protein: 6.4 g/dL — ABNORMAL LOW (ref 6.5–8.1)

## 2023-05-19 LAB — RETIC PANEL
Immature Retic Fract: 23.6 % — ABNORMAL HIGH (ref 2.3–15.9)
RBC.: 3.04 MIL/uL — ABNORMAL LOW (ref 3.87–5.11)
Retic Count, Absolute: 73.6 10*3/uL (ref 19.0–186.0)
Retic Ct Pct: 2.4 % (ref 0.4–3.1)
Reticulocyte Hemoglobin: 26.4 pg — ABNORMAL LOW (ref >27.9)

## 2023-06-02 ENCOUNTER — Other Ambulatory Visit: Payer: Self-pay | Admitting: Hematology and Oncology

## 2023-06-02 ENCOUNTER — Inpatient Hospital Stay: Payer: Medicare HMO

## 2023-06-02 DIAGNOSIS — D500 Iron deficiency anemia secondary to blood loss (chronic): Secondary | ICD-10-CM

## 2023-06-02 LAB — CBC WITH DIFFERENTIAL (CANCER CENTER ONLY)
Abs Immature Granulocytes: 0.02 10*3/uL (ref 0.00–0.07)
Basophils Absolute: 0 10*3/uL (ref 0.0–0.1)
Basophils Relative: 1 %
Eosinophils Absolute: 0.2 10*3/uL (ref 0.0–0.5)
Eosinophils Relative: 4 %
HCT: 26.1 % — ABNORMAL LOW (ref 36.0–46.0)
Hemoglobin: 8.3 g/dL — ABNORMAL LOW (ref 12.0–15.0)
Immature Granulocytes: 0 %
Lymphocytes Relative: 23 %
Lymphs Abs: 1.2 10*3/uL (ref 0.7–4.0)
MCH: 27.2 pg (ref 26.0–34.0)
MCHC: 31.8 g/dL (ref 30.0–36.0)
MCV: 85.6 fL (ref 80.0–100.0)
Monocytes Absolute: 0.6 10*3/uL (ref 0.1–1.0)
Monocytes Relative: 11 %
Neutro Abs: 3.2 10*3/uL (ref 1.7–7.7)
Neutrophils Relative %: 61 %
Platelet Count: 337 10*3/uL (ref 150–400)
RBC: 3.05 MIL/uL — ABNORMAL LOW (ref 3.87–5.11)
RDW: 16.7 % — ABNORMAL HIGH (ref 11.5–15.5)
WBC Count: 5.1 10*3/uL (ref 4.0–10.5)
nRBC: 0 % (ref 0.0–0.2)

## 2023-06-02 LAB — CMP (CANCER CENTER ONLY)
ALT: 6 U/L (ref 0–44)
AST: 13 U/L — ABNORMAL LOW (ref 15–41)
Albumin: 4.1 g/dL (ref 3.5–5.0)
Alkaline Phosphatase: 101 U/L (ref 38–126)
Anion gap: 6 (ref 5–15)
BUN: 10 mg/dL (ref 8–23)
CO2: 28 mmol/L (ref 22–32)
Calcium: 8.8 mg/dL — ABNORMAL LOW (ref 8.9–10.3)
Chloride: 108 mmol/L (ref 98–111)
Creatinine: 1.15 mg/dL — ABNORMAL HIGH (ref 0.44–1.00)
GFR, Estimated: 50 mL/min — ABNORMAL LOW (ref >60)
Glucose, Bld: 102 mg/dL — ABNORMAL HIGH (ref 70–99)
Potassium: 3.9 mmol/L (ref 3.5–5.1)
Sodium: 142 mmol/L (ref 135–145)
Total Bilirubin: 0.7 mg/dL (ref 0.0–1.2)
Total Protein: 7.1 g/dL (ref 6.5–8.1)

## 2023-06-02 LAB — RETIC PANEL
Immature Retic Fract: 24.8 % — ABNORMAL HIGH (ref 2.3–15.9)
RBC.: 3.13 MIL/uL — ABNORMAL LOW (ref 3.87–5.11)
Retic Count, Absolute: 99.5 10*3/uL (ref 19.0–186.0)
Retic Ct Pct: 3.2 % — ABNORMAL HIGH (ref 0.4–3.1)
Reticulocyte Hemoglobin: 25.2 pg — ABNORMAL LOW (ref >27.9)

## 2023-06-02 LAB — IRON AND IRON BINDING CAPACITY (CC-WL,HP ONLY)
Iron: 31 ug/dL (ref 28–170)
Saturation Ratios: 8 % — ABNORMAL LOW (ref 10.4–31.8)
TIBC: 378 ug/dL (ref 250–450)
UIBC: 347 ug/dL (ref 148–442)

## 2023-06-02 LAB — SAMPLE TO BLOOD BANK

## 2023-06-02 LAB — FERRITIN: Ferritin: 49 ng/mL (ref 11–307)

## 2023-06-05 DIAGNOSIS — N1831 Chronic kidney disease, stage 3a: Secondary | ICD-10-CM | POA: Diagnosis not present

## 2023-06-05 DIAGNOSIS — E119 Type 2 diabetes mellitus without complications: Secondary | ICD-10-CM | POA: Diagnosis not present

## 2023-06-05 DIAGNOSIS — K31811 Angiodysplasia of stomach and duodenum with bleeding: Secondary | ICD-10-CM | POA: Diagnosis not present

## 2023-06-05 DIAGNOSIS — G3184 Mild cognitive impairment, so stated: Secondary | ICD-10-CM | POA: Diagnosis not present

## 2023-06-05 DIAGNOSIS — E782 Mixed hyperlipidemia: Secondary | ICD-10-CM | POA: Diagnosis not present

## 2023-06-05 DIAGNOSIS — I119 Hypertensive heart disease without heart failure: Secondary | ICD-10-CM | POA: Diagnosis not present

## 2023-06-05 DIAGNOSIS — D631 Anemia in chronic kidney disease: Secondary | ICD-10-CM | POA: Diagnosis not present

## 2023-06-05 DIAGNOSIS — J449 Chronic obstructive pulmonary disease, unspecified: Secondary | ICD-10-CM | POA: Diagnosis not present

## 2023-06-05 DIAGNOSIS — I714 Abdominal aortic aneurysm, without rupture, unspecified: Secondary | ICD-10-CM | POA: Diagnosis not present

## 2023-06-16 ENCOUNTER — Other Ambulatory Visit: Payer: Self-pay | Admitting: Hematology and Oncology

## 2023-06-16 ENCOUNTER — Inpatient Hospital Stay: Payer: Medicare HMO | Attending: Hematology and Oncology

## 2023-06-16 DIAGNOSIS — Q2730 Arteriovenous malformation, site unspecified: Secondary | ICD-10-CM | POA: Diagnosis not present

## 2023-06-16 DIAGNOSIS — Z87891 Personal history of nicotine dependence: Secondary | ICD-10-CM | POA: Insufficient documentation

## 2023-06-16 DIAGNOSIS — D500 Iron deficiency anemia secondary to blood loss (chronic): Secondary | ICD-10-CM | POA: Diagnosis not present

## 2023-06-16 DIAGNOSIS — D573 Sickle-cell trait: Secondary | ICD-10-CM | POA: Insufficient documentation

## 2023-06-16 LAB — CBC WITH DIFFERENTIAL (CANCER CENTER ONLY)
Abs Immature Granulocytes: 0.02 10*3/uL (ref 0.00–0.07)
Basophils Absolute: 0 10*3/uL (ref 0.0–0.1)
Basophils Relative: 0 %
Eosinophils Absolute: 0.2 10*3/uL (ref 0.0–0.5)
Eosinophils Relative: 4 %
HCT: 26.7 % — ABNORMAL LOW (ref 36.0–46.0)
Hemoglobin: 8.2 g/dL — ABNORMAL LOW (ref 12.0–15.0)
Immature Granulocytes: 0 %
Lymphocytes Relative: 20 %
Lymphs Abs: 0.9 10*3/uL (ref 0.7–4.0)
MCH: 26.1 pg (ref 26.0–34.0)
MCHC: 30.7 g/dL (ref 30.0–36.0)
MCV: 85 fL (ref 80.0–100.0)
Monocytes Absolute: 0.5 10*3/uL (ref 0.1–1.0)
Monocytes Relative: 12 %
Neutro Abs: 2.9 10*3/uL (ref 1.7–7.7)
Neutrophils Relative %: 64 %
Platelet Count: 288 10*3/uL (ref 150–400)
RBC: 3.14 MIL/uL — ABNORMAL LOW (ref 3.87–5.11)
RDW: 15.9 % — ABNORMAL HIGH (ref 11.5–15.5)
WBC Count: 4.5 10*3/uL (ref 4.0–10.5)
nRBC: 0 % (ref 0.0–0.2)

## 2023-06-16 LAB — CMP (CANCER CENTER ONLY)
ALT: 7 U/L (ref 0–44)
AST: 14 U/L — ABNORMAL LOW (ref 15–41)
Albumin: 4.1 g/dL (ref 3.5–5.0)
Alkaline Phosphatase: 95 U/L (ref 38–126)
Anion gap: 4 — ABNORMAL LOW (ref 5–15)
BUN: 17 mg/dL (ref 8–23)
CO2: 27 mmol/L (ref 22–32)
Calcium: 9.2 mg/dL (ref 8.9–10.3)
Chloride: 111 mmol/L (ref 98–111)
Creatinine: 1.27 mg/dL — ABNORMAL HIGH (ref 0.44–1.00)
GFR, Estimated: 45 mL/min — ABNORMAL LOW (ref >60)
Glucose, Bld: 97 mg/dL (ref 70–99)
Potassium: 4.4 mmol/L (ref 3.5–5.1)
Sodium: 142 mmol/L (ref 135–145)
Total Bilirubin: 0.7 mg/dL (ref 0.0–1.2)
Total Protein: 7.1 g/dL (ref 6.5–8.1)

## 2023-06-16 LAB — IRON AND IRON BINDING CAPACITY (CC-WL,HP ONLY)
Iron: 33 ug/dL (ref 28–170)
Saturation Ratios: 8 % — ABNORMAL LOW (ref 10.4–31.8)
TIBC: 417 ug/dL (ref 250–450)
UIBC: 384 ug/dL (ref 148–442)

## 2023-06-16 LAB — RETIC PANEL
Immature Retic Fract: 24.9 % — ABNORMAL HIGH (ref 2.3–15.9)
RBC.: 3.1 MIL/uL — ABNORMAL LOW (ref 3.87–5.11)
Retic Count, Absolute: 80 10*3/uL (ref 19.0–186.0)
Retic Ct Pct: 2.6 % (ref 0.4–3.1)
Reticulocyte Hemoglobin: 26.9 pg — ABNORMAL LOW (ref >27.9)

## 2023-06-16 LAB — FERRITIN: Ferritin: 32 ng/mL (ref 11–307)

## 2023-06-29 ENCOUNTER — Other Ambulatory Visit: Payer: Self-pay | Admitting: Hematology and Oncology

## 2023-06-29 DIAGNOSIS — D500 Iron deficiency anemia secondary to blood loss (chronic): Secondary | ICD-10-CM

## 2023-06-30 ENCOUNTER — Inpatient Hospital Stay: Payer: Medicare HMO

## 2023-06-30 ENCOUNTER — Inpatient Hospital Stay (HOSPITAL_BASED_OUTPATIENT_CLINIC_OR_DEPARTMENT_OTHER): Payer: Medicare HMO | Admitting: Physician Assistant

## 2023-06-30 ENCOUNTER — Encounter: Payer: Self-pay | Admitting: Hematology and Oncology

## 2023-06-30 VITALS — BP 150/56 | HR 80 | Temp 98.1°F | Resp 16 | Wt 188.0 lb

## 2023-06-30 DIAGNOSIS — Q2730 Arteriovenous malformation, site unspecified: Secondary | ICD-10-CM | POA: Diagnosis not present

## 2023-06-30 DIAGNOSIS — D500 Iron deficiency anemia secondary to blood loss (chronic): Secondary | ICD-10-CM

## 2023-06-30 DIAGNOSIS — Z87891 Personal history of nicotine dependence: Secondary | ICD-10-CM | POA: Diagnosis not present

## 2023-06-30 DIAGNOSIS — D573 Sickle-cell trait: Secondary | ICD-10-CM | POA: Diagnosis not present

## 2023-06-30 LAB — CMP (CANCER CENTER ONLY)
ALT: 7 U/L (ref 0–44)
AST: 16 U/L (ref 15–41)
Albumin: 4.2 g/dL (ref 3.5–5.0)
Alkaline Phosphatase: 112 U/L (ref 38–126)
Anion gap: 5 (ref 5–15)
BUN: 15 mg/dL (ref 8–23)
CO2: 28 mmol/L (ref 22–32)
Calcium: 9.7 mg/dL (ref 8.9–10.3)
Chloride: 108 mmol/L (ref 98–111)
Creatinine: 1.49 mg/dL — ABNORMAL HIGH (ref 0.44–1.00)
GFR, Estimated: 37 mL/min — ABNORMAL LOW (ref >60)
Glucose, Bld: 87 mg/dL (ref 70–99)
Potassium: 5.1 mmol/L (ref 3.5–5.1)
Sodium: 141 mmol/L (ref 135–145)
Total Bilirubin: 0.8 mg/dL (ref 0.0–1.2)
Total Protein: 7.5 g/dL (ref 6.5–8.1)

## 2023-06-30 LAB — IRON AND IRON BINDING CAPACITY (CC-WL,HP ONLY)
Iron: 35 ug/dL (ref 28–170)
Saturation Ratios: 8 % — ABNORMAL LOW (ref 10.4–31.8)
TIBC: 435 ug/dL (ref 250–450)
UIBC: 400 ug/dL (ref 148–442)

## 2023-06-30 LAB — CBC WITH DIFFERENTIAL (CANCER CENTER ONLY)
Abs Immature Granulocytes: 0.01 10*3/uL (ref 0.00–0.07)
Basophils Absolute: 0 10*3/uL (ref 0.0–0.1)
Basophils Relative: 1 %
Eosinophils Absolute: 0.2 10*3/uL (ref 0.0–0.5)
Eosinophils Relative: 3 %
HCT: 27.4 % — ABNORMAL LOW (ref 36.0–46.0)
Hemoglobin: 8.8 g/dL — ABNORMAL LOW (ref 12.0–15.0)
Immature Granulocytes: 0 %
Lymphocytes Relative: 18 %
Lymphs Abs: 0.8 10*3/uL (ref 0.7–4.0)
MCH: 26.2 pg (ref 26.0–34.0)
MCHC: 32.1 g/dL (ref 30.0–36.0)
MCV: 81.5 fL (ref 80.0–100.0)
Monocytes Absolute: 0.5 10*3/uL (ref 0.1–1.0)
Monocytes Relative: 12 %
Neutro Abs: 3 10*3/uL (ref 1.7–7.7)
Neutrophils Relative %: 66 %
Platelet Count: 312 10*3/uL (ref 150–400)
RBC: 3.36 MIL/uL — ABNORMAL LOW (ref 3.87–5.11)
RDW: 15 % (ref 11.5–15.5)
WBC Count: 4.6 10*3/uL (ref 4.0–10.5)
nRBC: 0 % (ref 0.0–0.2)

## 2023-06-30 LAB — RETIC PANEL
Immature Retic Fract: 21.8 % — ABNORMAL HIGH (ref 2.3–15.9)
RBC.: 3.44 MIL/uL — ABNORMAL LOW (ref 3.87–5.11)
Retic Count, Absolute: 54 10*3/uL (ref 19.0–186.0)
Retic Ct Pct: 1.6 % (ref 0.4–3.1)
Reticulocyte Hemoglobin: 25 pg — ABNORMAL LOW (ref >27.9)

## 2023-06-30 LAB — FERRITIN: Ferritin: 31 ng/mL (ref 11–307)

## 2023-06-30 LAB — SAMPLE TO BLOOD BANK

## 2023-06-30 NOTE — Progress Notes (Unsigned)
[facility] [phone]   [phone]  PROGRESS NOTE  Patient Care Team: Gwenyth Bender, MD as PCP - General (Internal Medicine)  Hematological/Oncological History # Iron Deficiency Anemia due to Chronic Blood Loss # Sickle Cell Trait  1) 03/26/2009: Hgb 7.2, first Hgb on record 2) 09/02/2011: 2 units of PRBC at [facility] (Hgb noted at 8.9).  3)  12/03/2011: colonoscopy, found colon polyp and sigmoid diverticula. 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 Underwent argon plasma coagulation  4) Jan 2018: colonoscopy with 2 tubular adenomas removed. Small colonic AVM and small internal hemorrhoids.  5)10/24/2017: Hgb 7.7, MCV 71, Plt 338, and WBC 6.7. POEM procedure for Achalasia. duodenal AVM fulgurated.  6) 03/2018: Hgb 8.2.  7) March 2020: capsule endoscopy: nonbleeding gastric and proximal small bowel AVM,  8) 04/26/2019: Establish Care with Dr. Leonides Schanz  WBC 6.1, Hgb 9.3, MCV 75.3, Plt 289 9) 05/17/2019: Underwent EGD which showed esophagitis and small non-bleeding duodenal angiodysplasias. 10) 07/27/2019: WBC 5.9, Hgb 9.8, MCV 77.8, Plt 271. Recommend IV iron based on labs. Received feraheme 510mg  IV q7 days x 2 doses on 3/8 and 08/15/2019. Hgb electrophoresis confirms sickle cell trait.  11) 10/24/2019: WBC 5.4, Hgb 10.2, MCV 80.5, Plt 252 12) 02/15/2020: WBC 5.1, Hgb 9.7, MCV 80.2, Plt 280 13) 9/24-10/06/2019: IV feraheme 510mg  x 2 doses 14) 04/22/2021: Patient presented to emergency department with heavy nosebleeding.  Had a syncopal episode in the emergency department.  Discharged on 04/24/2021. Hgb 7.2 on admission.  15) 07/05/2021: presented to clinic with Hgb 6.2, MCV 88.9 and Plt 286. Admitted for GI workup.  16) 11/27/2021: colonscopy showed a single small localized angioectasia which was ablated. Small bowel endoscopy showed 5 angioectasias treated with APC  Interval History:  Lacey Vega 73 y.o. female with medical history  significant for iron deficiency anemia 2/2 to chronic blood loss/ sickle cell trait who presents for a follow up visit. The patient's last visit was on 05/05/2023. In the interim since the last visit she has had no major changes in her health..   On exam today Lacey Vega notes she still struggles with persistent fatigue and requires resting throughout the day. She denies any overt sources of bleeding. Her stools are dark but is taking iron pills.  The iron pills or not causing any stomach upset/constipation, nausea, or vomiting. She reports stable shortness of breath with exertion.   She otherwise denies any fevers, chills, sweats, chest pain, cough.  A full 10 point ROS is listed below.  MEDICAL HISTORY:  Past Medical History:  Diagnosis Date   Anemia    Anxiety    Asthma    Asthma    Bronchitis    hx   Colon polyps    hyperplastic   Diabetes (HCC)    mild, history of   DJD (degenerative joint disease), cervical    Dysphagia    history of   Dyspnea    Gallstones    GERD (gastroesophageal reflux disease)    H/O chest pain    secondary to anemia   Headache    History of arteriovenous malformation (AVM)    History of blood transfusion 10/2021   History of hiatal hernia    History of tobacco use    Hyperlipemia    Hypertension    Hypothyroidism    Iron deficiency anemia    Sickle cell anemia (HCC)    "patient is not aware of this"   Thyroid disease  TIA (transient ischemic attack)    "was told that she possible had one"   Wears dentures    Wears glasses     SURGICAL HISTORY: Past Surgical History:  Procedure Laterality Date   ABDOMINAL AORTIC ENDOVASCULAR STENT GRAFT Bilateral 01/31/2021   Procedure: ENDOVASCULAR ANEURYSM REPAIR (EVAR)Bilateral Groin Cutdown, left femoral endaterectomy with bovine patch angioplasty.;  Surgeon: Leonie Douglas, MD;  Location: [facility] OR;  Service: Vascular;  Laterality: Bilateral;   ABDOMINAL HYSTERECTOMY     ANTERIOR CERVICAL  DECOMP/DISCECTOMY FUSION N/A 10/03/2016   Procedure: ANTERIOR CERVICAL DECOMPRESSION/DISCECTOMY FUSION CERVICAL THREE- CERVICAL FOUR;  Surgeon: Coletta Memos, MD;  Location: [facility] OR;  Service: Neurosurgery;  Laterality: N/A;  Right side approach   BOTOX INJECTION  08/29/2015   Procedure: BOTOX INJECTION;  Surgeon: Charlott Rakes, MD;  Location: [facility] ENDOSCOPY;  Service: Endoscopy;;   CHOLECYSTECTOMY     COLONOSCOPY  12/03/2011   Procedure: COLONOSCOPY;  Surgeon: Shirley Friar, MD;  Location: [facility] ENDOSCOPY;  Service: Endoscopy;  Laterality: N/A;   COLONOSCOPY N/A 11/27/2021   Procedure: COLONOSCOPY;  Surgeon: Imogene Burn, MD;  Location: [facility] ENDOSCOPY;  Service: Gastroenterology;  Laterality: N/A;   ENTEROSCOPY N/A 07/07/2021   Procedure: ENTEROSCOPY;  Surgeon: Vida Rigger, MD;  Location: [facility] ENDOSCOPY;  Service: Endoscopy;  Laterality: N/A;   ENTEROSCOPY N/A 08/06/2021   Procedure: ENTEROSCOPY;  Surgeon: Kerin Salen, MD;  Location: [facility] ENDOSCOPY;  Service: Gastroenterology;  Laterality: N/A;   ENTEROSCOPY N/A 11/27/2021   Procedure: SMALL  BOWEL ENTEROSCOPY;  Surgeon: Imogene Burn, MD;  Location: [facility] ENDOSCOPY;  Service: Gastroenterology;  Laterality: N/A;   ENTEROSCOPY N/A 11/07/2022   Procedure: ENTEROSCOPY;  Surgeon: Lynann Bologna, MD;  Location: [facility] ENDOSCOPY;  Service: Gastroenterology;  Laterality: N/A;   ENTEROSCOPY N/A 04/03/2023   Procedure: ENTEROSCOPY;  Surgeon: Iva Boop, MD;  Location: [facility] ENDOSCOPY;  Service: Gastroenterology;  Laterality: N/A;   ESOPHAGEAL MANOMETRY N/A 07/13/2017   Procedure: ESOPHAGEAL MANOMETRY (EM);  Surgeon: Charlott Rakes, MD;  Location: [facility] ENDOSCOPY;  Service: Endoscopy;  Laterality: N/A;   ESOPHAGOGASTRODUODENOSCOPY  12/03/2011   Procedure: ESOPHAGOGASTRODUODENOSCOPY (EGD);  Surgeon: Shirley Friar, MD;  Location: [facility] ENDOSCOPY;  Service: Endoscopy;  Laterality: N/A;   ESOPHAGOGASTRODUODENOSCOPY (EGD) WITH PROPOFOL N/A 08/29/2015   Procedure:  ESOPHAGOGASTRODUODENOSCOPY (EGD) WITH PROPOFOL;  Surgeon: Charlott Rakes, MD;  Location: [facility] ENDOSCOPY;  Service: Endoscopy;  Laterality: N/A;   ESOPHAGOGASTRODUODENOSCOPY (EGD) WITH PROPOFOL N/A 05/17/2019   Procedure: ESOPHAGOGASTRODUODENOSCOPY (EGD) WITH PROPOFOL;  Surgeon: Charlott Rakes, MD;  Location: [facility] ENDOSCOPY;  Service: Endoscopy;  Laterality: N/A;   ESOPHAGOGASTRODUODENOSCOPY (EGD) WITH PROPOFOL N/A 04/24/2021   Procedure: ESOPHAGOGASTRODUODENOSCOPY (EGD) WITH PROPOFOL;  Surgeon: Kathi Der, MD;  Location: [facility] ENDOSCOPY;  Service: Gastroenterology;  Laterality: N/A;   GIVENS CAPSULE STUDY N/A 08/05/2018   Procedure: GIVENS CAPSULE STUDY;  Surgeon: Charlott Rakes, MD;  Location: [facility] ENDOSCOPY;  Service: Endoscopy;  Laterality: N/A;   GIVENS CAPSULE STUDY N/A 07/06/2021   Procedure: GIVENS CAPSULE STUDY;  Surgeon: Vida Rigger, MD;  Location: [facility] ENDOSCOPY;  Service: Endoscopy;  Laterality: N/A;   HOT HEMOSTASIS  12/03/2011   Procedure: HOT HEMOSTASIS (ARGON PLASMA COAGULATION/BICAP);  Surgeon: Shirley Friar, MD;  Location: [facility] ENDOSCOPY;  Service: Endoscopy;  Laterality: N/A;   HOT HEMOSTASIS N/A 07/07/2021   Procedure: HOT HEMOSTASIS (ARGON PLASMA COAGULATION/BICAP);  Surgeon: Vida Rigger, MD;  Location: [facility] ENDOSCOPY;  Service: Endoscopy;  Laterality: N/A;   HOT HEMOSTASIS N/A 08/06/2021   Procedure: HOT HEMOSTASIS (ARGON PLASMA COAGULATION/BICAP);  Surgeon:  Kerin Salen, MD;  Location: [facility] ENDOSCOPY;  Service: Gastroenterology;  Laterality: N/A;   HOT HEMOSTASIS  11/27/2021   Procedure: HOT HEMOSTASIS (ARGON PLASMA COAGULATION/BICAP);  Surgeon: Imogene Burn, MD;  Location: [facility] ENDOSCOPY;  Service: Gastroenterology;;  EGD and COLON   HOT HEMOSTASIS N/A 11/07/2022   Procedure: HOT HEMOSTASIS (ARGON PLASMA COAGULATION/BICAP);  Surgeon: Lynann Bologna, MD;  Location: [facility] ENDOSCOPY;  Service: Gastroenterology;  Laterality: N/A;   HOT HEMOSTASIS N/A 04/03/2023   Procedure: HOT HEMOSTASIS  (ARGON PLASMA COAGULATION/BICAP);  Surgeon: Iva Boop, MD;  Location: [facility] ENDOSCOPY;  Service: Gastroenterology;  Laterality: N/A;   NECK SURGERY     PERIPHERAL VASCULAR INTERVENTION  03/29/2021   Procedure: PERIPHERAL VASCULAR INTERVENTION;  Surgeon: Leonie Douglas, MD;  Location: [facility] INVASIVE CV LAB;  Service: Cardiovascular;;  Lt SFA Brachial Approach   POLYPECTOMY  11/27/2021   Procedure: POLYPECTOMY;  Surgeon: Imogene Burn, MD;  Location: [facility] ENDOSCOPY;  Service: Gastroenterology;;   ROTATOR CUFF REPAIR     right   ROTATOR CUFF REPAIR Right 2014   THROMBECTOMY FEMORAL ARTERY Left 01/31/2021   Procedure: THROMBECTOMY POPLITEAL  ARTERY;  Surgeon: Leonie Douglas, MD;  Location: [facility] OR;  Service: Vascular;  Laterality: Left;   TONSILLECTOMY      SOCIAL HISTORY: Social History   Socioeconomic History   Marital status: Divorced    Spouse name: Not on file   Number of children: 2   Years of education: Not on file   Highest education level: Not on file  Occupational History   Occupation: group leader environmental services    Employer: [employer]    Comment: [employer]  Tobacco Use   Smoking status: Former    Current packs/day: 0.00    Types: Cigarettes    Quit date: 06/02/2000    Years since quitting: 23.0   Smokeless tobacco: Never  Vaping Use   Vaping status: Never Used  Substance and Sexual Activity   Alcohol use: No   Drug use: No   Sexual activity: Not on file  Other Topics Concern   Not on file  Social History Narrative   ** Merged History Encounter **       Social Drivers of Health   Financial Resource Strain: Not on file  Food Insecurity: No Food Insecurity (04/05/2023)   Hunger Vital Sign    Worried About Running Out of Food in the Last Year: Never true    Ran Out of Food in the Last Year: Never true  Transportation Needs: No Transportation Needs (04/05/2023)   PRAPARE - Administrator, Civil Service (Medical): No    Lack of  Transportation (Non-Medical): No  Physical Activity: Not on file  Stress: Not on file  Social Connections: Not on file  Intimate Partner Violence: Not At Risk (04/05/2023)   Humiliation, Afraid, Rape, and Kick questionnaire    Fear of Current or Ex-Partner: No    Emotionally Abused: No    Physically Abused: No    Sexually Abused: No    FAMILY HISTORY: Family History  Problem Relation Age of Onset   Diabetes Mother    Heart attack Brother    Cancer Maternal Aunt     ALLERGIES:  is allergic to oxycodone.  MEDICATIONS:  Current Outpatient Medications  Medication Sig Dispense Refill   acetaminophen (TYLENOL) 500 MG tablet Take 500-1,000 mg by mouth every 6 (six) hours as needed for mild pain or headache.  albuterol (VENTOLIN HFA) 108 (90 Base) MCG/ACT inhaler Inhale 1-2 puffs into the lungs every 6 (six) hours as needed for wheezing or shortness of breath.     amoxicillin-clavulanate (AUGMENTIN) 500-125 MG tablet Take 1 tablet by mouth 2 (two) times daily. 8 tablet 0   ascorbic acid (VITAMIN C) 500 MG tablet Take 500 mg by mouth daily.     aspirin EC 81 MG tablet Take 1 tablet (81 mg total) by mouth daily. **Start on 11/25** (Patient taking differently: Take 81 mg by mouth in the morning.) 30 tablet 11   atorvastatin (LIPITOR) 40 MG tablet Take 40 mg by mouth at bedtime.     Biotin 2500 MCG CHEW Chew 5,000 mcg by mouth daily in the afternoon.     Cinnamon 500 MG TABS Take 1,000 mg by mouth every evening.     clopidogrel (PLAVIX) 75 MG tablet TAKE 1 TABLET BY MOUTH EVERY DAY 90 tablet 3   cyanocobalamin (VITAMIN B12) 1000 MCG tablet Take 1,000 mcg by mouth daily.     donepezil (ARICEPT) 5 MG tablet Take 5 mg by mouth at bedtime.     ferrous gluconate (FERGON) 324 MG tablet Take 324 mg by mouth daily with breakfast.     folic acid (FOLVITE) 800 MCG tablet Take 800 mcg by mouth daily.     levothyroxine (SYNTHROID) 125 MCG tablet Take 125 mcg by mouth See admin instructions. Take 125  mcg by mouth before breakfast on Mon/Tues/Wed/Thurs/Fri/Sat (take nothing on Sundays)     linaclotide (LINZESS) 145 MCG CAPS capsule Take 1 capsule (145 mcg total) by mouth daily before breakfast. (Patient taking differently: Take 145 mcg by mouth daily as needed (for constipation).) 30 capsule 6   montelukast (SINGULAIR) 10 MG tablet Take 10 mg by mouth daily in the afternoon.     nitroGLYCERIN (NITROSTAT) 0.4 MG SL tablet Place 0.4 mg under the tongue every 5 (five) minutes as needed for chest pain.     Omega-3 Fatty Acids (FISH OIL BURP-LESS) 1200 MG CAPS Take 1,200 mg by mouth daily in the afternoon.     pantoprazole (PROTONIX) 40 MG tablet Take 1 tablet (40 mg total) by mouth daily before breakfast. 90 tablet 1   WIXELA INHUB 250-50 MCG/ACT AEPB Inhale 1 puff into the lungs in the morning and at bedtime.     No current facility-administered medications for this visit.   Facility-Administered Medications Ordered in Other Visits  Medication Dose Route Frequency Provider Last Rate Last Admin   0.9 %  sodium chloride infusion   Intravenous Continuous Charlott Rakes, MD   Continued from Pre-op at 04/03/23 1244    REVIEW OF SYSTEMS:   Constitutional: ( - ) fevers, ( - )  chills , ( - ) night sweats (+) fatigue Eyes: ( - ) blurriness of vision, ( - ) double vision, ( - ) watery eyes Ears, nose, mouth, throat, and face: ( - ) mucositis, ( - ) sore throat Respiratory: ( - ) cough, ( - ) dyspnea, ( - ) wheezes Cardiovascular: ( - ) palpitation, ( - ) chest discomfort, ( + ) lower extremity swelling Gastrointestinal:  ( - ) nausea, ( - ) heartburn, ( - ) change in bowel habits Skin: ( - ) abnormal skin rashes Lymphatics: ( - ) new lymphadenopathy, ( - ) easy bruising Neurological: ( - ) numbness, ( - ) tingling, ( - ) new weaknesses Behavioral/Psych: ( - ) mood change, ( - ) new changes  All  other systems were reviewed with the patient and are negative.  PHYSICAL EXAMINATION: ECOG  PERFORMANCE STATUS: 1 - Symptomatic but completely ambulatory  Vitals:   06/30/23 1143  BP: (!) 150/56  Pulse: 80  Resp: 16  Temp: 98.1 F (36.7 C)  SpO2: 98%      Filed Weights   06/30/23 1143  Weight: 188 lb (85.3 kg)     GENERAL: elderly appearing African American female in NAD  SKIN: skin color, texture, turgor are normal, no rashes or significant lesions EYES: conjunctiva are pink and non-injected, sclera clear LUNGS: clear to auscultation and percussion with normal breathing effort HEART: regular rate & rhythm and no murmurs. Bilateral lower extremity edema Musculoskeletal: no cyanosis of digits and no clubbing  PSYCH: alert & oriented x 3, fluent speech NEURO: no focal motor/sensory deficits  LABORATORY DATA:  I have reviewed the data as listed    Latest Ref Rng & Units 06/30/2023   10:59 AM 06/16/2023    9:36 AM 06/02/2023    9:26 AM  CBC  WBC 4.0 - 10.5 K/uL 4.6  4.5  5.1   Hemoglobin 12.0 - 15.0 g/dL 8.8  8.2  8.3   Hematocrit 36.0 - 46.0 % 27.4  26.7  26.1   Platelets 150 - 400 K/uL 312  288  337        Latest Ref Rng & Units 06/30/2023   10:59 AM 06/16/2023    9:36 AM 06/02/2023    9:26 AM  CMP  Glucose 70 - 99 mg/dL 87  97  161   BUN 8 - 23 mg/dL 15  17  10    Creatinine 0.44 - 1.00 mg/dL 0.96  0.45  4.09   Sodium 135 - 145 mmol/L 141  142  142   Potassium 3.5 - 5.1 mmol/L 5.1  4.4  3.9   Chloride 98 - 111 mmol/L 108  111  108   CO2 22 - 32 mmol/L 28  27  28    Calcium 8.9 - 10.3 mg/dL 9.7  9.2  8.8   Total Protein 6.5 - 8.1 g/dL 7.5  7.1  7.1   Total Bilirubin 0.0 - 1.2 mg/dL 0.8  0.7  0.7   Alkaline Phos 38 - 126 U/L 112  95  101   AST 15 - 41 U/L 16  14  13    ALT 0 - 44 U/L 7  7  6      RADIOGRAPHIC STUDIES: None relevant to review.  No results found.  ASSESSMENT & PLAN Lacey Vega is a 73 y.o. female with medical history significant for iron deficiency anemia 2/2 to chronic blood loss presents for a follow up visit.    #Iron  Deficiency Anemia from Chronic Blood Loss --continue PO ferrous sulfide 325mg  daily with source of vitamin C. --source of blood loss previously identified as AVMs in the GI tract. Likely a strong contribution from the patient's sickle cell trait preventing her from reaching a normal Hgb. Baseline Hgb is approximately 10.  --Patient last received IV feraheme 510 mg x 1 dose on 11/12/2022 --transfusion goal for Hgb <7.0.  --labs today show WBC 4.6, hemoglobin 8.8, MCV 81.5, platelets 312.Iron panel shows serum iron 35%, TIBC 435, saturation 8%, ferritin 31 --No need for IV blood but recommend IV feraheme 510 mg x 1 dose.  --RTC in 8 weeks with interval 2 week lab checks   #Sickle Cell Trait -- Hgb electrophoresis confirmed sickle cell trait on 07/27/2019. --like a strong  contributing factor to the patients persistently low Hgb. Most likely her baseline hemoglobin is approximately 10.0   #Bleeding AVM/GI Bleeding --colonscopy and small bowel endoscopy performed on 11/27/2021 --small bowel endoscopy showed 5 angioectasias treated with APC --colonscopy showed 1 angioectasias --GI aware of her last Hgb drop.   No orders of the defined types were placed in this encounter.  All questions were answered. The patient knows to call the clinic with any problems, questions or concerns.   I have spent a total of 30 minutes minutes of face-to-face and non-face-to-face time, preparing to see the patient, obtaining and/or reviewing separately obtained history, performing a medically appropriate examination, counseling and educating the patient, ordering medications/tests/procedures, documenting clinical information in the electronic health record, independently interpreting results and communicating results to the patient, and care coordination.   Georga Kaufmann PA-C Dept of Hematology and Oncology [facility] Phone: [phone]   06/30/2023 2:51 PM

## 2023-07-02 ENCOUNTER — Encounter: Payer: Self-pay | Admitting: Physician Assistant

## 2023-07-02 ENCOUNTER — Telehealth: Payer: Self-pay | Admitting: Hematology and Oncology

## 2023-07-02 ENCOUNTER — Encounter: Payer: Self-pay | Admitting: Hematology and Oncology

## 2023-07-02 NOTE — Progress Notes (Signed)
Contacted pt per Lacey Vega [facility] to let this patient know that her iron levels are low so we will arrange for Iv feraheme x 1 dose at [facility] [address]  Pt acknowledged information and verbalized understanding.

## 2023-07-06 ENCOUNTER — Telehealth: Payer: Self-pay | Admitting: Pharmacy Technician

## 2023-07-06 NOTE — Telephone Encounter (Signed)
Williams Che note: patient will be scheduled as soon as possible.  Auth Submission: APPROVED Site of care: Site of care: [facility] Payer: [employer] Medication & CPT/J Code(s) submitted: Feraheme (ferumoxytol) [id number] Route of submission (phone, fax, portal): portal Phone # Fax # Auth type: Buy/Bill PB Units/visits requested: 2 doses Reference number: [id number] Approval from: 07/04/23 to 06/01/24

## 2023-07-07 ENCOUNTER — Ambulatory Visit: Payer: Medicare HMO

## 2023-07-07 VITALS — BP 121/74 | HR 66 | Temp 97.6°F | Resp 16 | Ht 65.0 in | Wt 189.8 lb

## 2023-07-07 DIAGNOSIS — D509 Iron deficiency anemia, unspecified: Secondary | ICD-10-CM | POA: Diagnosis not present

## 2023-07-07 DIAGNOSIS — D500 Iron deficiency anemia secondary to blood loss (chronic): Secondary | ICD-10-CM

## 2023-07-07 MED ORDER — SODIUM CHLORIDE 0.9 % IV SOLN
510.0000 mg | Freq: Once | INTRAVENOUS | Status: AC
  Administered 2023-07-07: 510 mg via INTRAVENOUS
  Filled 2023-07-07: qty 17

## 2023-07-07 MED ORDER — ACETAMINOPHEN 325 MG PO TABS
650.0000 mg | ORAL_TABLET | Freq: Once | ORAL | Status: AC
Start: 2023-07-07 — End: 2023-07-07
  Administered 2023-07-07: 650 mg via ORAL
  Filled 2023-07-07: qty 2

## 2023-07-07 MED ORDER — DIPHENHYDRAMINE HCL 25 MG PO CAPS
25.0000 mg | ORAL_CAPSULE | Freq: Once | ORAL | Status: AC
  Administered 2023-07-07: 25 mg via ORAL
  Filled 2023-07-07: qty 1

## 2023-07-07 NOTE — Progress Notes (Signed)
 Diagnosis: Iron  Deficiency Anemia  Provider:  Praveen Mannam MD  Procedure: IV Infusion  IV Type: Peripheral, IV Location: L Antecubital  Feraheme  (Ferumoxytol ), Dose: 510 mg  Infusion Start Time: 1107  Infusion Stop Time: 1130  Post Infusion IV Care: Observation period completed and Peripheral IV Discontinued  Discharge: Condition: Good, Destination: Home . AVS Provided  Performed by:  Arleta Ostrum G Pilking[place]-Burchett, RN

## 2023-07-14 ENCOUNTER — Other Ambulatory Visit: Payer: Self-pay

## 2023-07-14 ENCOUNTER — Inpatient Hospital Stay: Payer: Medicare HMO | Attending: Hematology and Oncology

## 2023-07-14 DIAGNOSIS — D573 Sickle-cell trait: Secondary | ICD-10-CM | POA: Insufficient documentation

## 2023-07-14 DIAGNOSIS — D500 Iron deficiency anemia secondary to blood loss (chronic): Secondary | ICD-10-CM

## 2023-07-14 DIAGNOSIS — E785 Hyperlipidemia, unspecified: Secondary | ICD-10-CM | POA: Diagnosis not present

## 2023-07-14 DIAGNOSIS — E039 Hypothyroidism, unspecified: Secondary | ICD-10-CM | POA: Diagnosis not present

## 2023-07-14 DIAGNOSIS — I10 Essential (primary) hypertension: Secondary | ICD-10-CM | POA: Diagnosis not present

## 2023-07-14 DIAGNOSIS — E119 Type 2 diabetes mellitus without complications: Secondary | ICD-10-CM | POA: Diagnosis not present

## 2023-07-14 LAB — CBC WITH DIFFERENTIAL (CANCER CENTER ONLY)
Abs Immature Granulocytes: 0.01 10*3/uL (ref 0.00–0.07)
Basophils Absolute: 0 10*3/uL (ref 0.0–0.1)
Basophils Relative: 1 %
Eosinophils Absolute: 0.2 10*3/uL (ref 0.0–0.5)
Eosinophils Relative: 4 %
HCT: 30.4 % — ABNORMAL LOW (ref 36.0–46.0)
Hemoglobin: 9.5 g/dL — ABNORMAL LOW (ref 12.0–15.0)
Immature Granulocytes: 0 %
Lymphocytes Relative: 16 %
Lymphs Abs: 0.8 10*3/uL (ref 0.7–4.0)
MCH: 26.2 pg (ref 26.0–34.0)
MCHC: 31.3 g/dL (ref 30.0–36.0)
MCV: 83.7 fL (ref 80.0–100.0)
Monocytes Absolute: 0.5 10*3/uL (ref 0.1–1.0)
Monocytes Relative: 11 %
Neutro Abs: 3.2 10*3/uL (ref 1.7–7.7)
Neutrophils Relative %: 68 %
Platelet Count: 255 10*3/uL (ref 150–400)
RBC: 3.63 MIL/uL — ABNORMAL LOW (ref 3.87–5.11)
RDW: 16 % — ABNORMAL HIGH (ref 11.5–15.5)
WBC Count: 4.7 10*3/uL (ref 4.0–10.5)
nRBC: 0 % (ref 0.0–0.2)

## 2023-07-14 LAB — IRON AND IRON BINDING CAPACITY (CC-WL,HP ONLY)
Iron: 70 ug/dL (ref 28–170)
Saturation Ratios: 19 % (ref 10.4–31.8)
TIBC: 377 ug/dL (ref 250–450)
UIBC: 307 ug/dL (ref 148–442)

## 2023-07-14 LAB — FERRITIN: Ferritin: 393 ng/mL — ABNORMAL HIGH (ref 11–307)

## 2023-07-16 ENCOUNTER — Ambulatory Visit: Payer: Medicare HMO

## 2023-07-16 ENCOUNTER — Other Ambulatory Visit: Payer: Self-pay | Admitting: Physician Assistant

## 2023-07-16 VITALS — BP 112/61 | HR 65 | Temp 97.7°F | Resp 16 | Ht 65.0 in | Wt 190.4 lb

## 2023-07-16 DIAGNOSIS — D509 Iron deficiency anemia, unspecified: Secondary | ICD-10-CM

## 2023-07-16 DIAGNOSIS — D500 Iron deficiency anemia secondary to blood loss (chronic): Secondary | ICD-10-CM

## 2023-07-16 MED ORDER — DIPHENHYDRAMINE HCL 25 MG PO CAPS
25.0000 mg | ORAL_CAPSULE | Freq: Once | ORAL | Status: AC
  Administered 2023-07-16: 25 mg via ORAL
  Filled 2023-07-16: qty 1

## 2023-07-16 MED ORDER — ACETAMINOPHEN 325 MG PO TABS
650.0000 mg | ORAL_TABLET | Freq: Once | ORAL | Status: AC
  Administered 2023-07-16: 650 mg via ORAL
  Filled 2023-07-16: qty 2

## 2023-07-16 MED ORDER — SODIUM CHLORIDE 0.9 % IV SOLN
510.0000 mg | Freq: Once | INTRAVENOUS | Status: AC
  Administered 2023-07-16: 510 mg via INTRAVENOUS
  Filled 2023-07-16: qty 17

## 2023-07-16 NOTE — Progress Notes (Signed)
Diagnosis: Iron Deficiency Anemia  Provider:  Chilton Greathouse MD  Procedure: IV Infusion  IV Type: Peripheral, IV Location: L Antecubital  Feraheme (Ferumoxytol), Dose: 510 mg  Infusion Start Time: 0915  Infusion Stop Time: 0933  Post Infusion IV Care: Observation period completed and Peripheral IV Discontinued  Discharge: Condition: Stable, Destination: Home . AVS Provided  Performed by:  Wyvonne Lenz, RN

## 2023-07-28 ENCOUNTER — Inpatient Hospital Stay: Payer: Medicare HMO

## 2023-07-28 DIAGNOSIS — D573 Sickle-cell trait: Secondary | ICD-10-CM | POA: Diagnosis not present

## 2023-07-28 DIAGNOSIS — D500 Iron deficiency anemia secondary to blood loss (chronic): Secondary | ICD-10-CM

## 2023-07-28 LAB — CBC WITH DIFFERENTIAL (CANCER CENTER ONLY)
Abs Immature Granulocytes: 0.01 10*3/uL (ref 0.00–0.07)
Basophils Absolute: 0 10*3/uL (ref 0.0–0.1)
Basophils Relative: 1 %
Eosinophils Absolute: 0.2 10*3/uL (ref 0.0–0.5)
Eosinophils Relative: 4 %
HCT: 30.7 % — ABNORMAL LOW (ref 36.0–46.0)
Hemoglobin: 9.7 g/dL — ABNORMAL LOW (ref 12.0–15.0)
Immature Granulocytes: 0 %
Lymphocytes Relative: 20 %
Lymphs Abs: 1 10*3/uL (ref 0.7–4.0)
MCH: 26.6 pg (ref 26.0–34.0)
MCHC: 31.6 g/dL (ref 30.0–36.0)
MCV: 84.3 fL (ref 80.0–100.0)
Monocytes Absolute: 0.5 10*3/uL (ref 0.1–1.0)
Monocytes Relative: 10 %
Neutro Abs: 3.3 10*3/uL (ref 1.7–7.7)
Neutrophils Relative %: 65 %
Platelet Count: 237 10*3/uL (ref 150–400)
RBC: 3.64 MIL/uL — ABNORMAL LOW (ref 3.87–5.11)
RDW: 16.7 % — ABNORMAL HIGH (ref 11.5–15.5)
WBC Count: 5.1 10*3/uL (ref 4.0–10.5)
nRBC: 0 % (ref 0.0–0.2)

## 2023-07-28 LAB — FERRITIN: Ferritin: 448 ng/mL — ABNORMAL HIGH (ref 11–307)

## 2023-07-28 LAB — IRON AND IRON BINDING CAPACITY (CC-WL,HP ONLY)
Iron: 60 ug/dL (ref 28–170)
Saturation Ratios: 19 % (ref 10.4–31.8)
TIBC: 318 ug/dL (ref 250–450)
UIBC: 258 ug/dL (ref 148–442)

## 2023-07-31 ENCOUNTER — Encounter: Payer: Self-pay | Admitting: Physician Assistant

## 2023-08-10 DIAGNOSIS — N1831 Chronic kidney disease, stage 3a: Secondary | ICD-10-CM | POA: Diagnosis not present

## 2023-08-10 DIAGNOSIS — I119 Hypertensive heart disease without heart failure: Secondary | ICD-10-CM | POA: Diagnosis not present

## 2023-08-10 DIAGNOSIS — J449 Chronic obstructive pulmonary disease, unspecified: Secondary | ICD-10-CM | POA: Diagnosis not present

## 2023-08-10 DIAGNOSIS — E782 Mixed hyperlipidemia: Secondary | ICD-10-CM | POA: Diagnosis not present

## 2023-08-10 DIAGNOSIS — D631 Anemia in chronic kidney disease: Secondary | ICD-10-CM | POA: Diagnosis not present

## 2023-08-10 DIAGNOSIS — G3189 Other specified degenerative diseases of nervous system: Secondary | ICD-10-CM | POA: Diagnosis not present

## 2023-08-10 DIAGNOSIS — I714 Abdominal aortic aneurysm, without rupture, unspecified: Secondary | ICD-10-CM | POA: Diagnosis not present

## 2023-08-10 DIAGNOSIS — E119 Type 2 diabetes mellitus without complications: Secondary | ICD-10-CM | POA: Diagnosis not present

## 2023-08-10 DIAGNOSIS — K31811 Angiodysplasia of stomach and duodenum with bleeding: Secondary | ICD-10-CM | POA: Diagnosis not present

## 2023-08-11 ENCOUNTER — Inpatient Hospital Stay: Payer: Medicare HMO | Attending: Hematology and Oncology

## 2023-08-11 DIAGNOSIS — D573 Sickle-cell trait: Secondary | ICD-10-CM | POA: Diagnosis not present

## 2023-08-11 DIAGNOSIS — D500 Iron deficiency anemia secondary to blood loss (chronic): Secondary | ICD-10-CM | POA: Insufficient documentation

## 2023-08-11 DIAGNOSIS — K922 Gastrointestinal hemorrhage, unspecified: Secondary | ICD-10-CM | POA: Diagnosis not present

## 2023-08-11 DIAGNOSIS — R2243 Localized swelling, mass and lump, lower limb, bilateral: Secondary | ICD-10-CM | POA: Diagnosis not present

## 2023-08-11 DIAGNOSIS — R0600 Dyspnea, unspecified: Secondary | ICD-10-CM | POA: Diagnosis not present

## 2023-08-11 DIAGNOSIS — Z87891 Personal history of nicotine dependence: Secondary | ICD-10-CM | POA: Diagnosis not present

## 2023-08-11 LAB — CBC WITH DIFFERENTIAL (CANCER CENTER ONLY)
Abs Immature Granulocytes: 0.01 10*3/uL (ref 0.00–0.07)
Basophils Absolute: 0 10*3/uL (ref 0.0–0.1)
Basophils Relative: 1 %
Eosinophils Absolute: 0.2 10*3/uL (ref 0.0–0.5)
Eosinophils Relative: 4 %
HCT: 31.2 % — ABNORMAL LOW (ref 36.0–46.0)
Hemoglobin: 9.9 g/dL — ABNORMAL LOW (ref 12.0–15.0)
Immature Granulocytes: 0 %
Lymphocytes Relative: 19 %
Lymphs Abs: 0.8 10*3/uL (ref 0.7–4.0)
MCH: 26.7 pg (ref 26.0–34.0)
MCHC: 31.7 g/dL (ref 30.0–36.0)
MCV: 84.1 fL (ref 80.0–100.0)
Monocytes Absolute: 0.5 10*3/uL (ref 0.1–1.0)
Monocytes Relative: 10 %
Neutro Abs: 2.8 10*3/uL (ref 1.7–7.7)
Neutrophils Relative %: 66 %
Platelet Count: 255 10*3/uL (ref 150–400)
RBC: 3.71 MIL/uL — ABNORMAL LOW (ref 3.87–5.11)
RDW: 16.7 % — ABNORMAL HIGH (ref 11.5–15.5)
WBC Count: 4.3 10*3/uL (ref 4.0–10.5)
nRBC: 0 % (ref 0.0–0.2)

## 2023-08-11 LAB — IRON AND IRON BINDING CAPACITY (CC-WL,HP ONLY)
Iron: 68 ug/dL (ref 28–170)
Saturation Ratios: 20 % (ref 10.4–31.8)
TIBC: 343 ug/dL (ref 250–450)
UIBC: 275 ug/dL (ref 148–442)

## 2023-08-11 LAB — FERRITIN: Ferritin: 325 ng/mL — ABNORMAL HIGH (ref 11–307)

## 2023-08-20 ENCOUNTER — Ambulatory Visit (HOSPITAL_COMMUNITY)
Admission: RE | Admit: 2023-08-20 | Discharge: 2023-08-20 | Disposition: A | Discharge status: Home / Self Care | Source: Ambulatory Visit | Attending: Vascular Surgery | Admitting: Vascular Surgery

## 2023-08-20 ENCOUNTER — Other Ambulatory Visit (HOSPITAL_COMMUNITY): Payer: Self-pay | Admitting: Internal Medicine

## 2023-08-20 DIAGNOSIS — M7989 Other specified soft tissue disorders: Secondary | ICD-10-CM | POA: Insufficient documentation

## 2023-08-25 ENCOUNTER — Inpatient Hospital Stay: Payer: Medicare HMO

## 2023-08-25 ENCOUNTER — Inpatient Hospital Stay (HOSPITAL_BASED_OUTPATIENT_CLINIC_OR_DEPARTMENT_OTHER): Payer: Medicare HMO | Admitting: Hematology and Oncology

## 2023-08-25 VITALS — BP 151/69 | HR 75 | Temp 97.2°F | Resp 18 | Ht 65.0 in | Wt 196.4 lb

## 2023-08-25 DIAGNOSIS — D573 Sickle-cell trait: Secondary | ICD-10-CM

## 2023-08-25 DIAGNOSIS — D500 Iron deficiency anemia secondary to blood loss (chronic): Secondary | ICD-10-CM

## 2023-08-25 DIAGNOSIS — K922 Gastrointestinal hemorrhage, unspecified: Secondary | ICD-10-CM | POA: Diagnosis not present

## 2023-08-25 DIAGNOSIS — Z87891 Personal history of nicotine dependence: Secondary | ICD-10-CM | POA: Diagnosis not present

## 2023-08-25 LAB — IRON AND IRON BINDING CAPACITY (CC-WL,HP ONLY)
Iron: 60 ug/dL (ref 28–170)
Saturation Ratios: 18 % (ref 10.4–31.8)
TIBC: 333 ug/dL (ref 250–450)
UIBC: 273 ug/dL (ref 148–442)

## 2023-08-25 LAB — CBC WITH DIFFERENTIAL (CANCER CENTER ONLY)
Abs Immature Granulocytes: 0.01 10*3/uL (ref 0.00–0.07)
Basophils Absolute: 0 10*3/uL (ref 0.0–0.1)
Basophils Relative: 1 %
Eosinophils Absolute: 0.2 10*3/uL (ref 0.0–0.5)
Eosinophils Relative: 3 %
HCT: 32.1 % — ABNORMAL LOW (ref 36.0–46.0)
Hemoglobin: 10.3 g/dL — ABNORMAL LOW (ref 12.0–15.0)
Immature Granulocytes: 0 %
Lymphocytes Relative: 20 %
Lymphs Abs: 1 10*3/uL (ref 0.7–4.0)
MCH: 27 pg (ref 26.0–34.0)
MCHC: 32.1 g/dL (ref 30.0–36.0)
MCV: 84 fL (ref 80.0–100.0)
Monocytes Absolute: 0.6 10*3/uL (ref 0.1–1.0)
Monocytes Relative: 13 %
Neutro Abs: 3.1 10*3/uL (ref 1.7–7.7)
Neutrophils Relative %: 63 %
Platelet Count: 251 10*3/uL (ref 150–400)
RBC: 3.82 MIL/uL — ABNORMAL LOW (ref 3.87–5.11)
RDW: 15.9 % — ABNORMAL HIGH (ref 11.5–15.5)
WBC Count: 5 10*3/uL (ref 4.0–10.5)
nRBC: 0 % (ref 0.0–0.2)

## 2023-08-25 LAB — FERRITIN: Ferritin: 189 ng/mL (ref 11–307)

## 2023-08-25 NOTE — Progress Notes (Signed)
 [facility] [phone]   [phone]  PROGRESS NOTE  Patient Care Team: Gwenyth Bender, MD as PCP - General (Internal Medicine)  Hematological/Oncological History # Iron Deficiency Anemia due to Chronic Blood Loss # Sickle Cell Trait  1) 03/26/2009: Hgb 7.2, first Hgb on record 2) 09/02/2011: 2 units of PRBC at [facility] (Hgb noted at 8.9).  3)  12/03/2011: colonoscopy, found colon polyp and sigmoid diverticula. 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 Underwent argon plasma coagulation  4) Jan 2018: colonoscopy with 2 tubular adenomas removed. Small colonic AVM and small internal hemorrhoids.  5)10/24/2017: Hgb 7.7, MCV 71, Plt 338, and WBC 6.7. POEM procedure for Achalasia. duodenal AVM fulgurated.  6) 03/2018: Hgb 8.2.  7) March 2020: capsule endoscopy: nonbleeding gastric and proximal small bowel AVM,  8) 04/26/2019: Establish Care with Dr. Leonides Schanz  WBC 6.1, Hgb 9.3, MCV 75.3, Plt 289 9) 05/17/2019: Underwent EGD which showed esophagitis and small non-bleeding duodenal angiodysplasias. 10) 07/27/2019: WBC 5.9, Hgb 9.8, MCV 77.8, Plt 271. Recommend IV iron based on labs. Received feraheme 510mg  IV q7 days x 2 doses on 3/8 and 08/15/2019. Hgb electrophoresis confirms sickle cell trait.  11) 10/24/2019: WBC 5.4, Hgb 10.2, MCV 80.5, Plt 252 12) 02/15/2020: WBC 5.1, Hgb 9.7, MCV 80.2, Plt 280 13) 9/24-10/06/2019: IV feraheme 510mg  x 2 doses 14) 04/22/2021: Patient presented to emergency department with heavy nosebleeding.  Had a syncopal episode in the emergency department.  Discharged on 04/24/2021. Hgb 7.2 on admission.  15) 07/05/2021: presented to clinic with Hgb 6.2, MCV 88.9 and Plt 286. Admitted for GI workup.  16) 11/27/2021: colonscopy showed a single small localized angioectasia which was ablated. Small bowel endoscopy showed 5 angioectasias treated with APC  Interval History:  Lacey Vega 73 y.o. female with medical history  significant for iron deficiency anemia 2/2 to chronic blood loss/ sickle cell trait who presents for a follow up visit. The patient's last visit was on 06/30/2023. In the interim since the last visit she has had no major changes in her health..   On exam today Lacey Vega notes reports that she has been fine overall and interim since her last visit.  She reports that she does consistently feel quite changes in her health.  She reports that she is having swelling in her legs and is gaining some weight.  She is not having any shortness of breath currently, but was recently started on nebulizer inhaler as needed.  She is tolerating iron pills without difficulty.  Not causing any stomach upset.  She is also not having any trouble with bleeding, occasional or dark stools.  She did recently undergo ultrasound of the lower extremities which did not show DVT but findings consistent with venous insufficiency.  She reports that she is eating well. She otherwise denies any fevers, chills, sweats, chest pain, cough.  A full 10 point ROS is listed below.  MEDICAL HISTORY:  Past Medical History:  Diagnosis Date   Anemia    Anxiety    Asthma    Asthma    Bronchitis    hx   Colon polyps    hyperplastic   Diabetes (HCC)    mild, history of   DJD (degenerative joint disease), cervical    Dysphagia    history of   Dyspnea    Gallstones    GERD (gastroesophageal reflux disease)    H/O chest pain    secondary to anemia   Headache  History of arteriovenous malformation (AVM)    History of blood transfusion 10/2021   History of hiatal hernia    History of tobacco use    Hyperlipemia    Hypertension    Hypothyroidism    Iron deficiency anemia    Sickle cell anemia (HCC)    "patient is not aware of this"   Thyroid disease    TIA (transient ischemic attack)    "was told that she possible had one"   Wears dentures    Wears glasses     SURGICAL HISTORY: Past Surgical History:  Procedure Laterality  Date   ABDOMINAL AORTIC ENDOVASCULAR STENT GRAFT Bilateral 01/31/2021   Procedure: ENDOVASCULAR ANEURYSM REPAIR (EVAR)Bilateral Groin Cutdown, left femoral endaterectomy with bovine patch angioplasty.;  Surgeon: Leonie Douglas, MD;  Location: [facility] OR;  Service: Vascular;  Laterality: Bilateral;   ABDOMINAL HYSTERECTOMY     ANTERIOR CERVICAL DECOMP/DISCECTOMY FUSION N/A 10/03/2016   Procedure: ANTERIOR CERVICAL DECOMPRESSION/DISCECTOMY FUSION CERVICAL THREE- CERVICAL FOUR;  Surgeon: Coletta Memos, MD;  Location: [facility] OR;  Service: Neurosurgery;  Laterality: N/A;  Right side approach   BOTOX INJECTION  08/29/2015   Procedure: BOTOX INJECTION;  Surgeon: Charlott Rakes, MD;  Location: [facility] ENDOSCOPY;  Service: Endoscopy;;   CHOLECYSTECTOMY     COLONOSCOPY  12/03/2011   Procedure: COLONOSCOPY;  Surgeon: Shirley Friar, MD;  Location: [facility] ENDOSCOPY;  Service: Endoscopy;  Laterality: N/A;   COLONOSCOPY N/A 11/27/2021   Procedure: COLONOSCOPY;  Surgeon: Imogene Burn, MD;  Location: [facility] ENDOSCOPY;  Service: Gastroenterology;  Laterality: N/A;   ENTEROSCOPY N/A 07/07/2021   Procedure: ENTEROSCOPY;  Surgeon: Vida Rigger, MD;  Location: [facility] ENDOSCOPY;  Service: Endoscopy;  Laterality: N/A;   ENTEROSCOPY N/A 08/06/2021   Procedure: ENTEROSCOPY;  Surgeon: Kerin Salen, MD;  Location: [facility] ENDOSCOPY;  Service: Gastroenterology;  Laterality: N/A;   ENTEROSCOPY N/A 11/27/2021   Procedure: SMALL  BOWEL ENTEROSCOPY;  Surgeon: Imogene Burn, MD;  Location: [facility] ENDOSCOPY;  Service: Gastroenterology;  Laterality: N/A;   ENTEROSCOPY N/A 11/07/2022   Procedure: ENTEROSCOPY;  Surgeon: Lynann Bologna, MD;  Location: [facility] ENDOSCOPY;  Service: Gastroenterology;  Laterality: N/A;   ENTEROSCOPY N/A 04/03/2023   Procedure: ENTEROSCOPY;  Surgeon: Iva Boop, MD;  Location: [facility] ENDOSCOPY;  Service: Gastroenterology;  Laterality: N/A;   ESOPHAGEAL MANOMETRY N/A 07/13/2017   Procedure: ESOPHAGEAL MANOMETRY (EM);  Surgeon: Charlott Rakes,  MD;  Location: [facility] ENDOSCOPY;  Service: Endoscopy;  Laterality: N/A;   ESOPHAGOGASTRODUODENOSCOPY  12/03/2011   Procedure: ESOPHAGOGASTRODUODENOSCOPY (EGD);  Surgeon: Shirley Friar, MD;  Location: [facility] ENDOSCOPY;  Service: Endoscopy;  Laterality: N/A;   ESOPHAGOGASTRODUODENOSCOPY (EGD) WITH PROPOFOL N/A 08/29/2015   Procedure: ESOPHAGOGASTRODUODENOSCOPY (EGD) WITH PROPOFOL;  Surgeon: Charlott Rakes, MD;  Location: [facility] ENDOSCOPY;  Service: Endoscopy;  Laterality: N/A;   ESOPHAGOGASTRODUODENOSCOPY (EGD) WITH PROPOFOL N/A 05/17/2019   Procedure: ESOPHAGOGASTRODUODENOSCOPY (EGD) WITH PROPOFOL;  Surgeon: Charlott Rakes, MD;  Location: [facility] ENDOSCOPY;  Service: Endoscopy;  Laterality: N/A;   ESOPHAGOGASTRODUODENOSCOPY (EGD) WITH PROPOFOL N/A 04/24/2021   Procedure: ESOPHAGOGASTRODUODENOSCOPY (EGD) WITH PROPOFOL;  Surgeon: Kathi Der, MD;  Location: [facility] ENDOSCOPY;  Service: Gastroenterology;  Laterality: N/A;   GIVENS CAPSULE STUDY N/A 08/05/2018   Procedure: GIVENS CAPSULE STUDY;  Surgeon: Charlott Rakes, MD;  Location: [facility] ENDOSCOPY;  Service: Endoscopy;  Laterality: N/A;   GIVENS CAPSULE STUDY N/A 07/06/2021   Procedure: GIVENS CAPSULE STUDY;  Surgeon: Vida Rigger, MD;  Location: [facility] ENDOSCOPY;  Service: Endoscopy;  Laterality: N/A;   HOT HEMOSTASIS  12/03/2011  Procedure: HOT HEMOSTASIS (ARGON PLASMA COAGULATION/BICAP);  Surgeon: Shirley Friar, MD;  Location: [facility] ENDOSCOPY;  Service: Endoscopy;  Laterality: N/A;   HOT HEMOSTASIS N/A 07/07/2021   Procedure: HOT HEMOSTASIS (ARGON PLASMA COAGULATION/BICAP);  Surgeon: Vida Rigger, MD;  Location: [facility] ENDOSCOPY;  Service: Endoscopy;  Laterality: N/A;   HOT HEMOSTASIS N/A 08/06/2021   Procedure: HOT HEMOSTASIS (ARGON PLASMA COAGULATION/BICAP);  Surgeon: Kerin Salen, MD;  Location: [facility] ENDOSCOPY;  Service: Gastroenterology;  Laterality: N/A;   HOT HEMOSTASIS  11/27/2021   Procedure: HOT HEMOSTASIS (ARGON PLASMA COAGULATION/BICAP);  Surgeon: Imogene Burn, MD;  Location: [facility] ENDOSCOPY;  Service: Gastroenterology;;  EGD and COLON   HOT HEMOSTASIS N/A 11/07/2022   Procedure: HOT HEMOSTASIS (ARGON PLASMA COAGULATION/BICAP);  Surgeon: Lynann Bologna, MD;  Location: [facility] ENDOSCOPY;  Service: Gastroenterology;  Laterality: N/A;   HOT HEMOSTASIS N/A 04/03/2023   Procedure: HOT HEMOSTASIS (ARGON PLASMA COAGULATION/BICAP);  Surgeon: Iva Boop, MD;  Location: [facility] ENDOSCOPY;  Service: Gastroenterology;  Laterality: N/A;   NECK SURGERY     PERIPHERAL VASCULAR INTERVENTION  03/29/2021   Procedure: PERIPHERAL VASCULAR INTERVENTION;  Surgeon: Leonie Douglas, MD;  Location: [facility] INVASIVE CV LAB;  Service: Cardiovascular;;  Lt SFA Brachial Approach   POLYPECTOMY  11/27/2021   Procedure: POLYPECTOMY;  Surgeon: Imogene Burn, MD;  Location: [facility] ENDOSCOPY;  Service: Gastroenterology;;   ROTATOR CUFF REPAIR     right   ROTATOR CUFF REPAIR Right 2014   THROMBECTOMY FEMORAL ARTERY Left 01/31/2021   Procedure: THROMBECTOMY POPLITEAL  ARTERY;  Surgeon: Leonie Douglas, MD;  Location: [facility] OR;  Service: Vascular;  Laterality: Left;   TONSILLECTOMY      SOCIAL HISTORY: Social History   Socioeconomic History   Marital status: Divorced    Spouse name: Not on file   Number of children: 2   Years of education: Not on file   Highest education level: Not on file  Occupational History   Occupation: group leader environmental services    Employer: [employer]    Comment: [employer]  Tobacco Use   Smoking status: Former    Current packs/day: 0.00    Types: Cigarettes    Quit date: 06/02/2000    Years since quitting: 23.2   Smokeless tobacco: Never  Vaping Use   Vaping status: Never Used  Substance and Sexual Activity   Alcohol use: No   Drug use: No   Sexual activity: Not on file  Other Topics Concern   Not on file  Social History Narrative   ** Merged History Encounter **       Social Drivers of Health   Financial Resource Strain: Not on  file  Food Insecurity: No Food Insecurity (04/05/2023)   Hunger Vital Sign    Worried About Running Out of Food in the Last Year: Never true    Ran Out of Food in the Last Year: Never true  Transportation Needs: No Transportation Needs (04/05/2023)   PRAPARE - Administrator, Civil Service (Medical): No    Lack of Transportation (Non-Medical): No  Physical Activity: Not on file  Stress: Not on file  Social Connections: Not on file  Intimate Partner Violence: Not At Risk (04/05/2023)   Humiliation, Afraid, Rape, and Kick questionnaire    Fear of Current or Ex-Partner: No    Emotionally Abused: No    Physically Abused: No    Sexually Abused: No    FAMILY HISTORY: Family History  Problem Relation  Age of Onset   Diabetes Mother    Heart attack Brother    Cancer Maternal Aunt     ALLERGIES:  is allergic to oxycodone.  MEDICATIONS:  Current Outpatient Medications  Medication Sig Dispense Refill   acetaminophen (TYLENOL) 500 MG tablet Take 500-1,000 mg by mouth every 6 (six) hours as needed for mild pain or headache.     albuterol (VENTOLIN HFA) 108 (90 Base) MCG/ACT inhaler Inhale 1-2 puffs into the lungs every 6 (six) hours as needed for wheezing or shortness of breath.     amoxicillin-clavulanate (AUGMENTIN) 500-125 MG tablet Take 1 tablet by mouth 2 (two) times daily. 8 tablet 0   ascorbic acid (VITAMIN C) 500 MG tablet Take 500 mg by mouth daily.     aspirin EC 81 MG tablet Take 1 tablet (81 mg total) by mouth daily. **Start on 11/25** (Patient taking differently: Take 81 mg by mouth in the morning.) 30 tablet 11   atorvastatin (LIPITOR) 40 MG tablet Take 40 mg by mouth at bedtime.     Biotin 2500 MCG CHEW Chew 5,000 mcg by mouth daily in the afternoon.     Cinnamon 500 MG TABS Take 1,000 mg by mouth every evening.     clopidogrel (PLAVIX) 75 MG tablet TAKE 1 TABLET BY MOUTH EVERY DAY 90 tablet 3   cyanocobalamin (VITAMIN B12) 1000 MCG tablet Take 1,000 mcg by mouth  daily.     donepezil (ARICEPT) 5 MG tablet Take 5 mg by mouth at bedtime.     ferrous gluconate (FERGON) 324 MG tablet Take 324 mg by mouth daily with breakfast.     folic acid (FOLVITE) 800 MCG tablet Take 800 mcg by mouth daily.     levothyroxine (SYNTHROID) 125 MCG tablet Take 125 mcg by mouth See admin instructions. Take 125 mcg by mouth before breakfast on Mon/Tues/Wed/Thurs/Fri/Sat (take nothing on Sundays)     linaclotide (LINZESS) 145 MCG CAPS capsule Take 1 capsule (145 mcg total) by mouth daily before breakfast. (Patient taking differently: Take 145 mcg by mouth daily as needed (for constipation).) 30 capsule 6   montelukast (SINGULAIR) 10 MG tablet Take 10 mg by mouth daily in the afternoon.     nitroGLYCERIN (NITROSTAT) 0.4 MG SL tablet Place 0.4 mg under the tongue every 5 (five) minutes as needed for chest pain.     Omega-3 Fatty Acids (FISH OIL BURP-LESS) 1200 MG CAPS Take 1,200 mg by mouth daily in the afternoon.     pantoprazole (PROTONIX) 40 MG tablet Take 1 tablet (40 mg total) by mouth daily before breakfast. 90 tablet 1   WIXELA INHUB 250-50 MCG/ACT AEPB Inhale 1 puff into the lungs in the morning and at bedtime.     No current facility-administered medications for this visit.   Facility-Administered Medications Ordered in Other Visits  Medication Dose Route Frequency Provider Last Rate Last Admin   0.9 %  sodium chloride infusion   Intravenous Continuous Charlott Rakes, MD   Continued from Pre-op at 04/03/23 1244    REVIEW OF SYSTEMS:   Constitutional: ( - ) fevers, ( - )  chills , ( - ) night sweats (+) fatigue Eyes: ( - ) blurriness of vision, ( - ) double vision, ( - ) watery eyes Ears, nose, mouth, throat, and face: ( - ) mucositis, ( - ) sore throat Respiratory: ( - ) cough, ( - ) dyspnea, ( - ) wheezes Cardiovascular: ( - ) palpitation, ( - ) chest discomfort, ( + )  lower extremity swelling Gastrointestinal:  ( - ) nausea, ( - ) heartburn, ( - ) change in bowel  habits Skin: ( - ) abnormal skin rashes Lymphatics: ( - ) new lymphadenopathy, ( - ) easy bruising Neurological: ( - ) numbness, ( - ) tingling, ( - ) new weaknesses Behavioral/Psych: ( - ) mood change, ( - ) new changes  All other systems were reviewed with the patient and are negative.  PHYSICAL EXAMINATION: ECOG PERFORMANCE STATUS: 1 - Symptomatic but completely ambulatory  Vitals:   08/25/23 0958 08/25/23 1001  BP: (!) 162/72 (!) 151/69  Pulse: 75   Resp: 18   Temp: (!) 97.2 F (36.2 C)   SpO2: 99%        Filed Weights   08/25/23 0958  Weight: 196 lb 7 oz (89.1 kg)      GENERAL: elderly appearing African American female in NAD  SKIN: skin color, texture, turgor are normal, no rashes or significant lesions EYES: conjunctiva are pink and non-injected, sclera clear LUNGS: clear to auscultation and percussion with normal breathing effort HEART: regular rate & rhythm and no murmurs. Bilateral lower extremity edema Musculoskeletal: no cyanosis of digits and no clubbing  PSYCH: alert & oriented x 3, fluent speech NEURO: no focal motor/sensory deficits  LABORATORY DATA:  I have reviewed the data as listed    Latest Ref Rng & Units 08/25/2023    9:31 AM 08/11/2023    9:40 AM 07/28/2023    9:27 AM  CBC  WBC 4.0 - 10.5 K/uL 5.0  4.3  5.1   Hemoglobin 12.0 - 15.0 g/dL 16.1  9.9  9.7   Hematocrit 36.0 - 46.0 % 32.1  31.2  30.7   Platelets 150 - 400 K/uL 251  255  237        Latest Ref Rng & Units 06/30/2023   10:59 AM 06/16/2023    9:36 AM 06/02/2023    9:26 AM  CMP  Glucose 70 - 99 mg/dL 87  97  096   BUN 8 - 23 mg/dL 15  17  10    Creatinine 0.44 - 1.00 mg/dL 0.45  4.09  8.11   Sodium 135 - 145 mmol/L 141  142  142   Potassium 3.5 - 5.1 mmol/L 5.1  4.4  3.9   Chloride 98 - 111 mmol/L 108  111  108   CO2 22 - 32 mmol/L 28  27  28    Calcium 8.9 - 10.3 mg/dL 9.7  9.2  8.8   Total Protein 6.5 - 8.1 g/dL 7.5  7.1  7.1   Total Bilirubin 0.0 - 1.2 mg/dL 0.8  0.7  0.7    Alkaline Phos 38 - 126 U/L 112  95  101   AST 15 - 41 U/L 16  14  13    ALT 0 - 44 U/L 7  7  6      RADIOGRAPHIC STUDIES: None relevant to review.  VAS [place] LOWER EXTREMITY VENOUS REFLUX Result Date: 08/20/2023  Lower Venous Reflux Study Patient Name:  Lacey Vega  Date of Exam:   08/20/2023 Medical Rec #: [id number]             Accession #:    [id number] Date of Birth: June 28, 1950            Patient Gender: F Patient Age:   39 years Exam Location:  [facility] Procedure:      VAS [place] LOWER EXTREMITY VENOUS REFLUX Referring  Phys: ERIC DEAN --------------------------------------------------------------------------------  Indications: Pain, and Swelling.  Risk Factors: Venous Insufficiency. Performing Technologist: Criss Rosales RVT  Examination Guidelines: A complete evaluation includes B-mode imaging, spectral Doppler, color Doppler, and power Doppler as needed of all accessible portions of each vessel. Bilateral testing is considered an integral part of a complete examination. Limited examinations for reoccurring indications may be performed as noted. The reflux portion of the exam is performed with the patient in reverse Trendelenburg. Significant venous reflux is defined as >500 ms in the superficial venous system, and >1 second in the deep venous system.  Venous Reflux Times +--------------+------+-----------+------------+-------------------------------+ RIGHT         RefluxReflux TimeDiameter cmsComments                                       Yes                                                         +--------------+------+-----------+------------+-------------------------------+ CFV            yes   >1 second                                             +--------------+------+-----------+------------+-------------------------------+ FV prox        yes   >1 second                                              +--------------+------+-----------+------------+-------------------------------+ FV mid         yes   >1 second                                             +--------------+------+-----------+------------+-------------------------------+ FV dist        yes   >1 second                                             +--------------+------+-----------+------------+-------------------------------+ Popliteal      yes   >1 second             Avascularized Structure: 3.5 x                                             1.8 cm                          +--------------+------+-----------+------------+-------------------------------+ GSV at SFJ     yes    >500 ms      0.77                                    +--------------+------+-----------+------------+-------------------------------+  GSV prox thigh yes    >500 ms      0.57                                    +--------------+------+-----------+------------+-------------------------------+ GSV mid thigh  yes    >500 ms      0.40                                    +--------------+------+-----------+------------+-------------------------------+ GSV dist thigh yes    >500 ms      0.43                                    +--------------+------+-----------+------------+-------------------------------+ GSV at knee    yes    >500 ms      0.36                                    +--------------+------+-----------+------------+-------------------------------+ GSV prox calf  yes    >500 ms      0.71                                    +--------------+------+-----------+------------+-------------------------------+ GSV mid calf   yes    >500 ms      0.40                                    +--------------+------+-----------+------------+-------------------------------+ GSV dist calf  yes    >500 ms      0.26                                    +--------------+------+-----------+------------+-------------------------------+ SSV  Pop Fossa  yes    >500 ms      0.70                                    +--------------+------+-----------+------------+-------------------------------+ SSV prox calf  yes    >500 ms      0.50                                    +--------------+------+-----------+------------+-------------------------------+ SSV mid calf   yes    >500 ms      0.41                                    +--------------+------+-----------+------------+-------------------------------+  Summary: Right: - No evidence of deep vein thrombosis seen in the right lower extremity, from the common femoral through the popliteal veins. - No evidence of superficial venous thrombosis in the right lower extremity. - Avascularized structure within the right popliteal space measuring 3.5 x 1.8 cm. - Venous reflux is noted in the right common femoral, femoral and popliteal veins. - Venous reflux is noted in the right sapheno-femoral junction. - Venous reflux is  noted in the right greater saphenous vein in the thigh. - Venous reflux is noted in the right greater saphenous vein in the calf. - Venous reflux is noted in the right short saphenous vein.  *See table(s) above for measurements and observations. Electronically signed by Gerarda Fraction on 08/20/2023 at 4:25:19 PM.    Final     ASSESSMENT & PLAN Lacey Vega is a 73 y.o. female with medical history significant for iron deficiency anemia 2/2 to chronic blood loss presents for a follow up visit.    #Iron Deficiency Anemia from Chronic Blood Loss --continue PO ferrous sulfide 325mg  daily with source of vitamin C. --source of blood loss previously identified as AVMs in the GI tract. Likely a strong contribution from the patient's sickle cell trait preventing her from reaching a normal Hgb. Baseline Hgb is approximately 10.  --Patient last received IV feraheme 510 mg x 1 dose on 11/12/2022 --transfusion goal for Hgb <7.0.  --labs today show WBC 5.0, Hgb 10.3, MCV 84, Plt  251 --No need for IV blood but recommend IV feraheme 510 mg x 1 dose.  --RTC in 12 weeks with interval 4 week lab checks. Strict return precautions for any signs/symptoms of worsening anemia.    #Sickle Cell Trait -- Hgb electrophoresis confirmed sickle cell trait on 07/27/2019. --like a strong contributing factor to the patients persistently low Hgb. Most likely her baseline hemoglobin is approximately 10.0   #Bleeding AVM/GI Bleeding --colonscopy and small bowel endoscopy performed on 11/27/2021 --small bowel endoscopy showed 5 angioectasias treated with APC --colonscopy showed 1 angioectasias --GI following   No orders of the defined types were placed in this encounter.  All questions were answered. The patient knows to call the clinic with any problems, questions or concerns.   I have spent a total of 30 minutes minutes of face-to-face and non-face-to-face time, preparing to see the patient, obtaining and/or reviewing separately obtained history, performing a medically appropriate examination, counseling and educating the patient, ordering medications/tests/procedures, documenting clinical information in the electronic health record, independently interpreting results and communicating results to the patient, and care coordination.   Ulysees Barns, MD Department of Hematology/Oncology [facility] Phone: [phone] Pager: [phone] Email: [email or web address]    08/25/2023 2:10 PM

## 2023-08-28 ENCOUNTER — Other Ambulatory Visit: Payer: Self-pay | Admitting: Internal Medicine

## 2023-09-04 ENCOUNTER — Encounter (HOSPITAL_COMMUNITY)

## 2023-09-16 ENCOUNTER — Other Ambulatory Visit: Payer: Self-pay | Admitting: Internal Medicine

## 2023-09-16 DIAGNOSIS — Z1231 Encounter for screening mammogram for malignant neoplasm of breast: Secondary | ICD-10-CM

## 2023-09-21 DIAGNOSIS — G3189 Other specified degenerative diseases of nervous system: Secondary | ICD-10-CM | POA: Diagnosis not present

## 2023-09-21 DIAGNOSIS — N1831 Chronic kidney disease, stage 3a: Secondary | ICD-10-CM | POA: Diagnosis not present

## 2023-09-21 DIAGNOSIS — J449 Chronic obstructive pulmonary disease, unspecified: Secondary | ICD-10-CM | POA: Diagnosis not present

## 2023-09-21 DIAGNOSIS — D631 Anemia in chronic kidney disease: Secondary | ICD-10-CM | POA: Diagnosis not present

## 2023-09-21 DIAGNOSIS — E782 Mixed hyperlipidemia: Secondary | ICD-10-CM | POA: Diagnosis not present

## 2023-09-21 DIAGNOSIS — K31811 Angiodysplasia of stomach and duodenum with bleeding: Secondary | ICD-10-CM | POA: Diagnosis not present

## 2023-09-21 DIAGNOSIS — I714 Abdominal aortic aneurysm, without rupture, unspecified: Secondary | ICD-10-CM | POA: Diagnosis not present

## 2023-09-21 DIAGNOSIS — I119 Hypertensive heart disease without heart failure: Secondary | ICD-10-CM | POA: Diagnosis not present

## 2023-09-21 DIAGNOSIS — E119 Type 2 diabetes mellitus without complications: Secondary | ICD-10-CM | POA: Diagnosis not present

## 2023-09-22 ENCOUNTER — Inpatient Hospital Stay: Attending: Hematology and Oncology

## 2023-09-22 DIAGNOSIS — D500 Iron deficiency anemia secondary to blood loss (chronic): Secondary | ICD-10-CM | POA: Insufficient documentation

## 2023-09-22 DIAGNOSIS — D573 Sickle-cell trait: Secondary | ICD-10-CM | POA: Insufficient documentation

## 2023-09-22 LAB — IRON AND IRON BINDING CAPACITY (CC-WL,HP ONLY)
Iron: 45 ug/dL (ref 28–170)
Saturation Ratios: 13 % (ref 10.4–31.8)
TIBC: 361 ug/dL (ref 250–450)
UIBC: 316 ug/dL (ref 148–442)

## 2023-09-25 ENCOUNTER — Ambulatory Visit
Admission: RE | Admit: 2023-09-25 | Discharge: 2023-09-25 | Disposition: A | Discharge status: Home / Self Care | Source: Ambulatory Visit | Attending: Internal Medicine | Admitting: Internal Medicine

## 2023-09-25 DIAGNOSIS — Z1231 Encounter for screening mammogram for malignant neoplasm of breast: Secondary | ICD-10-CM

## 2023-10-20 ENCOUNTER — Inpatient Hospital Stay: Attending: Hematology and Oncology

## 2023-10-20 DIAGNOSIS — D500 Iron deficiency anemia secondary to blood loss (chronic): Secondary | ICD-10-CM | POA: Insufficient documentation

## 2023-10-20 DIAGNOSIS — D573 Sickle-cell trait: Secondary | ICD-10-CM | POA: Diagnosis not present

## 2023-10-20 DIAGNOSIS — M48062 Spinal stenosis, lumbar region with neurogenic claudication: Secondary | ICD-10-CM | POA: Diagnosis not present

## 2023-10-20 DIAGNOSIS — Z6831 Body mass index (BMI) 31.0-31.9, adult: Secondary | ICD-10-CM | POA: Diagnosis not present

## 2023-10-20 LAB — IRON AND IRON BINDING CAPACITY (CC-WL,HP ONLY)
Iron: 45 ug/dL (ref 28–170)
Saturation Ratios: 13 % (ref 10.4–31.8)
TIBC: 354 ug/dL (ref 250–450)
UIBC: 309 ug/dL (ref 148–442)

## 2023-11-15 ENCOUNTER — Other Ambulatory Visit: Payer: Self-pay | Admitting: Hematology and Oncology

## 2023-11-15 DIAGNOSIS — D500 Iron deficiency anemia secondary to blood loss (chronic): Secondary | ICD-10-CM

## 2023-11-15 NOTE — Progress Notes (Unsigned)
 [facility] [phone]   [phone]  PROGRESS NOTE  Patient Care Team: Ferrell Hu, MD as PCP - General (Internal Medicine)  Hematological/Oncological History # Iron  Deficiency Anemia due to Chronic Blood Loss # Sickle Cell Trait  1) 03/26/2009: Hgb 7.2, first Hgb on record 2) 09/02/2011: 2 units of PRBC at [facility] (Hgb noted at 8.9).  3)  12/03/2011: colonoscopy, found colon polyp and sigmoid diverticula. 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 Underwent argon plasma coagulation  4) Jan 2018: colonoscopy with 2 tubular adenomas removed. Small colonic AVM and small internal hemorrhoids.  5)10/24/2017: Hgb 7.7, MCV 71, Plt 338, and WBC 6.7. POEM procedure for Achalasia. duodenal AVM fulgurated.  6) 03/2018: Hgb 8.2.  7) March 2020: capsule endoscopy: nonbleeding gastric and proximal small bowel AVM,  8) 04/26/2019: Establish Care with Dr. Rosaline Coma  WBC 6.1, Hgb 9.3, MCV 75.3, Plt 289 9) 05/17/2019: Underwent EGD which showed esophagitis and small non-bleeding duodenal angiodysplasias. 10) 07/27/2019: WBC 5.9, Hgb 9.8, MCV 77.8, Plt 271. Recommend IV iron  based on labs. Received feraheme  510mg  IV q7 days x 2 doses on 3/8 and 08/15/2019. Hgb electrophoresis confirms sickle cell trait.  11) 10/24/2019: WBC 5.4, Hgb 10.2, MCV 80.5, Plt 252 12) 02/15/2020: WBC 5.1, Hgb 9.7, MCV 80.2, Plt 280 13) 9/24-10/06/2019: IV feraheme  510mg  x 2 doses 14) 04/22/2021: Patient presented to emergency department with heavy nosebleeding.  Had a syncopal episode in the emergency department.  Discharged on 04/24/2021. Hgb 7.2 on admission.  15) 07/05/2021: presented to clinic with Hgb 6.2, MCV 88.9 and Plt 286. Admitted for GI workup.  16) 11/27/2021: colonscopy showed a single small localized angioectasia which was ablated. Small bowel endoscopy showed 5 angioectasias treated with APC  Interval History:  Lacey Vega 73 y.o. female with medical history  significant for iron  deficiency anemia 2/2 to chronic blood loss/ sickle cell trait who presents for a follow up visit. The patient's last visit was on 08/25/2023. In the interim since the last visit she has had no major changes in her health..   On exam today Lacey Vega notes she has been steady overall in interim since her last visit.  She has had no overt signs of bleeding, bruising, or dark stools.  She reports no recent illnesses such as runny nose, sore throat, cough.  She denies any fevers, chills, sweats.  She reports she is eating well and has been doing her best to try to eat iron  rich food.  She notes she does not eat much in the way of red meat.  She is taking her iron  pills as prescribed and they are not causing any stomach upset or constipation.  She reports her energy is not worth 2 sets.  She is not having any lightheadedness or dizziness but does get some shortness of breath on exertion.  She is wearing a jacket today and reports that she is feeling cold.  She otherwise denies any fevers or sweats.  A full 10 point ROS is otherwise negative.  MEDICAL HISTORY:  Past Medical History:  Diagnosis Date   Anemia    Anxiety    Asthma    Asthma    Bronchitis    hx   Colon polyps    hyperplastic   Diabetes (HCC)    mild, history of   DJD (degenerative joint disease), cervical    Dysphagia    history of   Dyspnea    Gallstones  GERD (gastroesophageal reflux disease)    H/O chest pain    secondary to anemia   Headache    History of arteriovenous malformation (AVM)    History of blood transfusion 10/2021   History of hiatal hernia    History of tobacco use    Hyperlipemia    Hypertension    Hypothyroidism    Iron  deficiency anemia    Sickle cell anemia (HCC)    patient is not aware of this   Thyroid  disease    TIA (transient ischemic attack)    was told that she possible had one   Wears dentures    Wears glasses     SURGICAL HISTORY: Past Surgical History:   Procedure Laterality Date   ABDOMINAL AORTIC ENDOVASCULAR STENT GRAFT Bilateral 01/31/2021   Procedure: ENDOVASCULAR ANEURYSM REPAIR (EVAR)Bilateral Groin Cutdown, left femoral endaterectomy with bovine patch angioplasty.;  Surgeon: Carlene Che, MD;  Location: [facility] OR;  Service: Vascular;  Laterality: Bilateral;   ABDOMINAL HYSTERECTOMY     ANTERIOR CERVICAL DECOMP/DISCECTOMY FUSION N/A 10/03/2016   Procedure: ANTERIOR CERVICAL DECOMPRESSION/DISCECTOMY FUSION CERVICAL THREE- CERVICAL FOUR;  Surgeon: Audie Bleacher, MD;  Location: [facility] OR;  Service: Neurosurgery;  Laterality: N/A;  Right side approach   BOTOX  INJECTION  08/29/2015   Procedure: BOTOX  INJECTION;  Surgeon: Baldo Bonds, MD;  Location: [facility] ENDOSCOPY;  Service: Endoscopy;;   CHOLECYSTECTOMY     COLONOSCOPY  12/03/2011   Procedure: COLONOSCOPY;  Surgeon: Yvetta Herbert, MD;  Location: [facility] ENDOSCOPY;  Service: Endoscopy;  Laterality: N/A;   COLONOSCOPY N/A 11/27/2021   Procedure: COLONOSCOPY;  Surgeon: Daina Drum, MD;  Location: [facility] ENDOSCOPY;  Service: Gastroenterology;  Laterality: N/A;   ENTEROSCOPY N/A 07/07/2021   Procedure: ENTEROSCOPY;  Surgeon: Ozell Blunt, MD;  Location: [facility] ENDOSCOPY;  Service: Endoscopy;  Laterality: N/A;   ENTEROSCOPY N/A 08/06/2021   Procedure: ENTEROSCOPY;  Surgeon: Genell Ken, MD;  Location: [facility] ENDOSCOPY;  Service: Gastroenterology;  Laterality: N/A;   ENTEROSCOPY N/A 11/27/2021   Procedure: SMALL  BOWEL ENTEROSCOPY;  Surgeon: Daina Drum, MD;  Location: [facility] ENDOSCOPY;  Service: Gastroenterology;  Laterality: N/A;   ENTEROSCOPY N/A 11/07/2022   Procedure: ENTEROSCOPY;  Surgeon: Lajuan Pila, MD;  Location: [facility] ENDOSCOPY;  Service: Gastroenterology;  Laterality: N/A;   ENTEROSCOPY N/A 04/03/2023   Procedure: ENTEROSCOPY;  Surgeon: Kenney Peacemaker, MD;  Location: [facility] ENDOSCOPY;  Service: Gastroenterology;  Laterality: N/A;   ESOPHAGEAL MANOMETRY N/A 07/13/2017   Procedure: ESOPHAGEAL MANOMETRY (EM);   Surgeon: Baldo Bonds, MD;  Location: [facility] ENDOSCOPY;  Service: Endoscopy;  Laterality: N/A;   ESOPHAGOGASTRODUODENOSCOPY  12/03/2011   Procedure: ESOPHAGOGASTRODUODENOSCOPY (EGD);  Surgeon: Yvetta Herbert, MD;  Location: [facility] ENDOSCOPY;  Service: Endoscopy;  Laterality: N/A;   ESOPHAGOGASTRODUODENOSCOPY (EGD) WITH PROPOFOL  N/A 08/29/2015   Procedure: ESOPHAGOGASTRODUODENOSCOPY (EGD) WITH PROPOFOL ;  Surgeon: Baldo Bonds, MD;  Location: [facility] ENDOSCOPY;  Service: Endoscopy;  Laterality: N/A;   ESOPHAGOGASTRODUODENOSCOPY (EGD) WITH PROPOFOL  N/A 05/17/2019   Procedure: ESOPHAGOGASTRODUODENOSCOPY (EGD) WITH PROPOFOL ;  Surgeon: Baldo Bonds, MD;  Location: [facility] ENDOSCOPY;  Service: Endoscopy;  Laterality: N/A;   ESOPHAGOGASTRODUODENOSCOPY (EGD) WITH PROPOFOL  N/A 04/24/2021   Procedure: ESOPHAGOGASTRODUODENOSCOPY (EGD) WITH PROPOFOL ;  Surgeon: Felecia Hopper, MD;  Location: [facility] ENDOSCOPY;  Service: Gastroenterology;  Laterality: N/A;   GIVENS CAPSULE STUDY N/A 08/05/2018   Procedure: GIVENS CAPSULE STUDY;  Surgeon: Baldo Bonds, MD;  Location: [facility] ENDOSCOPY;  Service: Endoscopy;  Laterality: N/A;   GIVENS CAPSULE STUDY N/A 07/06/2021   Procedure: GIVENS CAPSULE STUDY;  Surgeon: Ozell Blunt, MD;  Location: [facility] ENDOSCOPY;  Service: Endoscopy;  Laterality: N/A;   HOT HEMOSTASIS  12/03/2011   Procedure: HOT HEMOSTASIS (ARGON PLASMA COAGULATION/BICAP);  Surgeon: Yvetta Herbert, MD;  Location: [facility] ENDOSCOPY;  Service: Endoscopy;  Laterality: N/A;   HOT HEMOSTASIS N/A 07/07/2021   Procedure: HOT HEMOSTASIS (ARGON PLASMA COAGULATION/BICAP);  Surgeon: Ozell Blunt, MD;  Location: [facility] ENDOSCOPY;  Service: Endoscopy;  Laterality: N/A;   HOT HEMOSTASIS N/A 08/06/2021   Procedure: HOT HEMOSTASIS (ARGON PLASMA COAGULATION/BICAP);  Surgeon: Genell Ken, MD;  Location: [facility] ENDOSCOPY;  Service: Gastroenterology;  Laterality: N/A;   HOT HEMOSTASIS  11/27/2021   Procedure: HOT HEMOSTASIS (ARGON PLASMA  COAGULATION/BICAP);  Surgeon: Daina Drum, MD;  Location: [facility] ENDOSCOPY;  Service: Gastroenterology;;  EGD and COLON   HOT HEMOSTASIS N/A 11/07/2022   Procedure: HOT HEMOSTASIS (ARGON PLASMA COAGULATION/BICAP);  Surgeon: Lajuan Pila, MD;  Location: [facility] ENDOSCOPY;  Service: Gastroenterology;  Laterality: N/A;   HOT HEMOSTASIS N/A 04/03/2023   Procedure: HOT HEMOSTASIS (ARGON PLASMA COAGULATION/BICAP);  Surgeon: Kenney Peacemaker, MD;  Location: [facility] ENDOSCOPY;  Service: Gastroenterology;  Laterality: N/A;   NECK SURGERY     PERIPHERAL VASCULAR INTERVENTION  03/29/2021   Procedure: PERIPHERAL VASCULAR INTERVENTION;  Surgeon: Carlene Che, MD;  Location: [facility] INVASIVE CV LAB;  Service: Cardiovascular;;  Lt SFA Brachial Approach   POLYPECTOMY  11/27/2021   Procedure: POLYPECTOMY;  Surgeon: Daina Drum, MD;  Location: [facility] ENDOSCOPY;  Service: Gastroenterology;;   ROTATOR CUFF REPAIR     right   ROTATOR CUFF REPAIR Right 2014   THROMBECTOMY FEMORAL ARTERY Left 01/31/2021   Procedure: THROMBECTOMY POPLITEAL  ARTERY;  Surgeon: Carlene Che, MD;  Location: [facility] OR;  Service: Vascular;  Laterality: Left;   TONSILLECTOMY      SOCIAL HISTORY: Social History   Socioeconomic History   Marital status: Divorced    Spouse name: Not on file   Number of children: 2   Years of education: Not on file   Highest education level: Not on file  Occupational History   Occupation: group leader environmental services    Employer: [employer]    Comment: [employer]  Tobacco Use   Smoking status: Former    Current packs/day: 0.00    Types: Cigarettes    Quit date: 06/02/2000    Years since quitting: 23.4   Smokeless tobacco: Never  Vaping Use   Vaping status: Never Used  Substance and Sexual Activity   Alcohol use: No   Drug use: No   Sexual activity: Not on file  Other Topics Concern   Not on file  Social History Narrative   ** Merged History Encounter **       Social Drivers of  Health   Financial Resource Strain: Not on file  Food Insecurity: No Food Insecurity (04/05/2023)   Hunger Vital Sign    Worried About Running Out of Food in the Last Year: Never true    Ran Out of Food in the Last Year: Never true  Transportation Needs: No Transportation Needs (04/05/2023)   PRAPARE - Administrator, Civil Service (Medical): No    Lack of Transportation (Non-Medical): No  Physical Activity: Not on file  Stress: Not on file  Social Connections: Not on file  Intimate Partner Violence: Not At Risk (04/05/2023)   Humiliation, Afraid, Rape, and Kick questionnaire    Fear of Current or Ex-Partner: No    Emotionally Abused: No  Physically Abused: No    Sexually Abused: No    FAMILY HISTORY: Family History  Problem Relation Age of Onset   Diabetes Mother    Heart attack Brother    Cancer Maternal Aunt     ALLERGIES:  is allergic to oxycodone.  MEDICATIONS:  Current Outpatient Medications  Medication Sig Dispense Refill   acetaminophen  (TYLENOL ) 500 MG tablet Take 500-1,000 mg by mouth every 6 (six) hours as needed for mild pain or headache.     albuterol  (VENTOLIN  HFA) 108 (90 Base) MCG/ACT inhaler Inhale 1-2 puffs into the lungs every 6 (six) hours as needed for wheezing or shortness of breath.     amoxicillin -clavulanate (AUGMENTIN ) 500-125 MG tablet Take 1 tablet by mouth 2 (two) times daily. 8 tablet 0   ascorbic acid (VITAMIN C) 500 MG tablet Take 500 mg by mouth daily.     aspirin  EC 81 MG tablet Take 1 tablet (81 mg total) by mouth daily. **Start on 11/25** (Patient taking differently: Take 81 mg by mouth in the morning.) 30 tablet 11   atorvastatin  (LIPITOR) 40 MG tablet Take 40 mg by mouth at bedtime.     Biotin 2500 MCG CHEW Chew 5,000 mcg by mouth daily in the afternoon.     Cinnamon 500 MG TABS Take 1,000 mg by mouth every evening.     clopidogrel  (PLAVIX ) 75 MG tablet TAKE 1 TABLET BY MOUTH EVERY DAY 90 tablet 3   cyanocobalamin  (VITAMIN  B12) 1000 MCG tablet Take 1,000 mcg by mouth daily.     donepezil  (ARICEPT ) 5 MG tablet Take 5 mg by mouth at bedtime.     ferrous gluconate  (FERGON) 324 MG tablet Take 324 mg by mouth daily with breakfast.     folic acid  (FOLVITE ) 800 MCG tablet Take 800 mcg by mouth daily.     levothyroxine  (SYNTHROID ) 125 MCG tablet Take 125 mcg by mouth See admin instructions. Take 125 mcg by mouth before breakfast on Mon/Tues/Wed/Thurs/Fri/Sat (take nothing on Sundays)     LINZESS  145 MCG CAPS capsule TAKE 1 CAPSULE BY MOUTH DAILY BEFORE BREAKFAST. 30 capsule 6   montelukast  (SINGULAIR ) 10 MG tablet Take 10 mg by mouth daily in the afternoon.     nitroGLYCERIN  (NITROSTAT ) 0.4 MG SL tablet Place 0.4 mg under the tongue every 5 (five) minutes as needed for chest pain.     Omega-3 Fatty Acids (FISH OIL BURP-LESS) 1200 MG CAPS Take 1,200 mg by mouth daily in the afternoon.     pantoprazole  (PROTONIX ) 40 MG tablet Take 1 tablet (40 mg total) by mouth daily before breakfast. 90 tablet 1   WIXELA INHUB 250-50 MCG/ACT AEPB Inhale 1 puff into the lungs in the morning and at bedtime.     No current facility-administered medications for this visit.   Facility-Administered Medications Ordered in Other Visits  Medication Dose Route Frequency Provider Last Rate Last Admin   0.9 %  sodium chloride  infusion   Intravenous Continuous Baldo Bonds, MD   Continued from Pre-op at 04/03/23 1244    REVIEW OF SYSTEMS:   Constitutional: ( - ) fevers, ( - )  chills , ( - ) night sweats (+) fatigue Eyes: ( - ) blurriness of vision, ( - ) double vision, ( - ) watery eyes Ears, nose, mouth, throat, and face: ( - ) mucositis, ( - ) sore throat Respiratory: ( - ) cough, ( - ) dyspnea, ( - ) wheezes Cardiovascular: ( - ) palpitation, ( - ) chest  discomfort, ( + ) lower extremity swelling Gastrointestinal:  ( - ) nausea, ( - ) heartburn, ( - ) change in bowel habits Skin: ( - ) abnormal skin rashes Lymphatics: ( - ) new  lymphadenopathy, ( - ) easy bruising Neurological: ( - ) numbness, ( - ) tingling, ( - ) new weaknesses Behavioral/Psych: ( - ) mood change, ( - ) new changes  All other systems were reviewed with the patient and are negative.  PHYSICAL EXAMINATION: ECOG PERFORMANCE STATUS: 1 - Symptomatic but completely ambulatory  Vitals:   11/16/23 1116  Pulse: 88  Resp: 17  Temp: 98.3 F (36.8 C)  SpO2: 96%   Filed Weights   11/16/23 1116  Weight: 191 lb 4.8 oz (86.8 kg)    GENERAL: elderly appearing African American female in NAD  SKIN: skin color, texture, turgor are normal, no rashes or significant lesions EYES: conjunctiva are pink and non-injected, sclera clear LUNGS: clear to auscultation and percussion with normal breathing effort HEART: regular rate & rhythm and no murmurs. Bilateral lower extremity edema Musculoskeletal: no cyanosis of digits and no clubbing  PSYCH: alert & oriented x 3, fluent speech NEURO: no focal motor/sensory deficits  LABORATORY DATA:  I have reviewed the data as listed    Latest Ref Rng & Units 11/16/2023   10:54 AM 08/25/2023    9:31 AM 08/11/2023    9:40 AM  CBC  WBC 4.0 - 10.5 K/uL 5.5  5.0  4.3   Hemoglobin 12.0 - 15.0 g/dL 9.7  16.1  9.9   Hematocrit 36.0 - 46.0 % 30.4  32.1  31.2   Platelets 150 - 400 K/uL 295  251  255        Latest Ref Rng & Units 11/16/2023   10:54 AM 06/30/2023   10:59 AM 06/16/2023    9:36 AM  CMP  Glucose 70 - 99 mg/dL 98  87  97   BUN 8 - 23 mg/dL 16  15  17    Creatinine 0.44 - 1.00 mg/dL 0.96  0.45  4.09   Sodium 135 - 145 mmol/L 144  141  142   Potassium 3.5 - 5.1 mmol/L 3.5  5.1  4.4   Chloride 98 - 111 mmol/L 108  108  111   CO2 22 - 32 mmol/L 28  28  27    Calcium  8.9 - 10.3 mg/dL 9.2  9.7  9.2   Total Protein 6.5 - 8.1 g/dL 7.4  7.5  7.1   Total Bilirubin 0.0 - 1.2 mg/dL 0.7  0.8  0.7   Alkaline Phos 38 - 126 U/L 108  112  95   AST 15 - 41 U/L 14  16  14    ALT 0 - 44 U/L 7  7  7      RADIOGRAPHIC  STUDIES: None relevant to review.  No results found.   ASSESSMENT & PLAN Lacey Vega is a 73 y.o. female with medical history significant for iron  deficiency anemia 2/2 to chronic blood loss presents for a follow up visit.    #Iron  Deficiency Anemia from Chronic Blood Loss --continue PO ferrous sulfide 325mg  daily with source of vitamin C. --source of blood loss previously identified as AVMs in the GI tract. Likely a strong contribution from the patient's sickle cell trait preventing her from reaching a normal Hgb. Baseline Hgb is approximately 10.  --Patient last received IV feraheme  510 mg x 1 dose on 11/12/2022 --transfusion goal for Hgb <7.0.  --labs  today show WBC 5.5, Hgb 9.7, MCV 81.3, Plt 295  --No need for IV blood but recommend IV feraheme  510 mg x 1 dose.  --RTC in 12 weeks with interval 4 week lab checks. Strict return precautions for any signs/symptoms of worsening anemia.    #Sickle Cell Trait -- Hgb electrophoresis confirmed sickle cell trait on 07/27/2019. --like a strong contributing factor to the patients persistently low Hgb. Most likely her baseline hemoglobin is approximately 10.0   #Bleeding AVM/GI Bleeding --colonscopy and small bowel endoscopy performed on 11/27/2021 --small bowel endoscopy showed 5 angioectasias treated with APC --colonscopy showed 1 angioectasias --GI following   No orders of the defined types were placed in this encounter.  All questions were answered. The patient knows to call the clinic with any problems, questions or concerns.   I have spent a total of 30 minutes minutes of face-to-face and non-face-to-face time, preparing to see the patient, obtaining and/or reviewing separately obtained history, performing a medically appropriate examination, counseling and educating the patient, ordering medications/tests/procedures, documenting clinical information in the electronic health record, independently interpreting results and  communicating results to the patient, and care coordination.   Rogerio Clay, MD Department of Hematology/Oncology [facility] Phone: [phone] Pager: [phone] Email: [email or web address]  11/17/2023 9:28 AM

## 2023-11-16 ENCOUNTER — Inpatient Hospital Stay (HOSPITAL_BASED_OUTPATIENT_CLINIC_OR_DEPARTMENT_OTHER): Admitting: Hematology and Oncology

## 2023-11-16 ENCOUNTER — Inpatient Hospital Stay: Attending: Hematology and Oncology

## 2023-11-16 VITALS — HR 88 | Temp 98.3°F | Resp 17 | Wt 191.3 lb

## 2023-11-16 DIAGNOSIS — D500 Iron deficiency anemia secondary to blood loss (chronic): Secondary | ICD-10-CM | POA: Insufficient documentation

## 2023-11-16 DIAGNOSIS — Z87891 Personal history of nicotine dependence: Secondary | ICD-10-CM | POA: Diagnosis not present

## 2023-11-16 DIAGNOSIS — K31811 Angiodysplasia of stomach and duodenum with bleeding: Secondary | ICD-10-CM | POA: Diagnosis not present

## 2023-11-16 DIAGNOSIS — D573 Sickle-cell trait: Secondary | ICD-10-CM | POA: Diagnosis not present

## 2023-11-16 DIAGNOSIS — Z860100 Personal history of colon polyps, unspecified: Secondary | ICD-10-CM | POA: Insufficient documentation

## 2023-11-16 LAB — CMP (CANCER CENTER ONLY)
ALT: 7 U/L (ref 0–44)
AST: 14 U/L — ABNORMAL LOW (ref 15–41)
Albumin: 4.2 g/dL (ref 3.5–5.0)
Alkaline Phosphatase: 108 U/L (ref 38–126)
Anion gap: 8 (ref 5–15)
BUN: 16 mg/dL (ref 8–23)
CO2: 28 mmol/L (ref 22–32)
Calcium: 9.2 mg/dL (ref 8.9–10.3)
Chloride: 108 mmol/L (ref 98–111)
Creatinine: 1.29 mg/dL — ABNORMAL HIGH (ref 0.44–1.00)
GFR, Estimated: 44 mL/min — ABNORMAL LOW (ref >60)
Glucose, Bld: 98 mg/dL (ref 70–99)
Potassium: 3.5 mmol/L (ref 3.5–5.1)
Sodium: 144 mmol/L (ref 135–145)
Total Bilirubin: 0.7 mg/dL (ref 0.0–1.2)
Total Protein: 7.4 g/dL (ref 6.5–8.1)

## 2023-11-16 LAB — IRON AND IRON BINDING CAPACITY (CC-WL,HP ONLY)
Iron: 45 ug/dL (ref 28–170)
Saturation Ratios: 12 % (ref 10.4–31.8)
TIBC: 374 ug/dL (ref 250–450)
UIBC: 329 ug/dL (ref 148–442)

## 2023-11-16 LAB — RETIC PANEL
Immature Retic Fract: 19.3 % — ABNORMAL HIGH (ref 2.3–15.9)
RBC.: 3.77 MIL/uL — ABNORMAL LOW (ref 3.87–5.11)
Retic Count, Absolute: 56.2 10*3/uL (ref 19.0–186.0)
Retic Ct Pct: 1.5 % (ref 0.4–3.1)
Reticulocyte Hemoglobin: 28.7 pg (ref >27.9)

## 2023-11-16 LAB — CBC WITH DIFFERENTIAL (CANCER CENTER ONLY)
Abs Immature Granulocytes: 0.02 10*3/uL (ref 0.00–0.07)
Basophils Absolute: 0.1 10*3/uL (ref 0.0–0.1)
Basophils Relative: 1 %
Eosinophils Absolute: 0.2 10*3/uL (ref 0.0–0.5)
Eosinophils Relative: 4 %
HCT: 30.4 % — ABNORMAL LOW (ref 36.0–46.0)
Hemoglobin: 9.7 g/dL — ABNORMAL LOW (ref 12.0–15.0)
Immature Granulocytes: 0 %
Lymphocytes Relative: 20 %
Lymphs Abs: 1.1 10*3/uL (ref 0.7–4.0)
MCH: 25.9 pg — ABNORMAL LOW (ref 26.0–34.0)
MCHC: 31.9 g/dL (ref 30.0–36.0)
MCV: 81.3 fL (ref 80.0–100.0)
Monocytes Absolute: 0.6 10*3/uL (ref 0.1–1.0)
Monocytes Relative: 10 %
Neutro Abs: 3.5 10*3/uL (ref 1.7–7.7)
Neutrophils Relative %: 65 %
Platelet Count: 295 10*3/uL (ref 150–400)
RBC: 3.74 MIL/uL — ABNORMAL LOW (ref 3.87–5.11)
RDW: 14.9 % (ref 11.5–15.5)
WBC Count: 5.5 10*3/uL (ref 4.0–10.5)
nRBC: 0 % (ref 0.0–0.2)

## 2023-11-16 LAB — FERRITIN: Ferritin: 77 ng/mL (ref 11–307)

## 2023-11-17 ENCOUNTER — Encounter: Payer: Self-pay | Admitting: Hematology and Oncology

## 2023-11-23 DIAGNOSIS — E119 Type 2 diabetes mellitus without complications: Secondary | ICD-10-CM | POA: Diagnosis not present

## 2023-11-23 DIAGNOSIS — G3184 Mild cognitive impairment, so stated: Secondary | ICD-10-CM | POA: Diagnosis not present

## 2023-11-23 DIAGNOSIS — D631 Anemia in chronic kidney disease: Secondary | ICD-10-CM | POA: Diagnosis not present

## 2023-11-23 DIAGNOSIS — I714 Abdominal aortic aneurysm, without rupture, unspecified: Secondary | ICD-10-CM | POA: Diagnosis not present

## 2023-11-23 DIAGNOSIS — J449 Chronic obstructive pulmonary disease, unspecified: Secondary | ICD-10-CM | POA: Diagnosis not present

## 2023-11-23 DIAGNOSIS — I119 Hypertensive heart disease without heart failure: Secondary | ICD-10-CM | POA: Diagnosis not present

## 2023-11-23 DIAGNOSIS — N1831 Chronic kidney disease, stage 3a: Secondary | ICD-10-CM | POA: Diagnosis not present

## 2023-11-23 DIAGNOSIS — K31811 Angiodysplasia of stomach and duodenum with bleeding: Secondary | ICD-10-CM | POA: Diagnosis not present

## 2023-11-23 DIAGNOSIS — E782 Mixed hyperlipidemia: Secondary | ICD-10-CM | POA: Diagnosis not present

## 2023-11-25 ENCOUNTER — Other Ambulatory Visit (HOSPITAL_COMMUNITY): Payer: Self-pay | Admitting: Neurosurgery

## 2023-11-25 DIAGNOSIS — M48062 Spinal stenosis, lumbar region with neurogenic claudication: Secondary | ICD-10-CM

## 2023-12-03 ENCOUNTER — Other Ambulatory Visit

## 2023-12-03 ENCOUNTER — Ambulatory Visit (HOSPITAL_COMMUNITY)
Admission: RE | Admit: 2023-12-03 | Discharge: 2023-12-03 | Disposition: A | Discharge status: Home / Self Care | Source: Ambulatory Visit | Attending: Neurosurgery | Admitting: Neurosurgery

## 2023-12-03 ENCOUNTER — Telehealth: Payer: Self-pay | Admitting: Gastroenterology

## 2023-12-03 ENCOUNTER — Ambulatory Visit: Admitting: Gastroenterology

## 2023-12-03 ENCOUNTER — Encounter: Payer: Self-pay | Admitting: Gastroenterology

## 2023-12-03 VITALS — BP 118/50 | HR 86 | Ht 65.0 in | Wt 190.0 lb

## 2023-12-03 DIAGNOSIS — K31811 Angiodysplasia of stomach and duodenum with bleeding: Secondary | ICD-10-CM | POA: Diagnosis present

## 2023-12-03 DIAGNOSIS — E039 Hypothyroidism, unspecified: Secondary | ICD-10-CM | POA: Diagnosis present

## 2023-12-03 DIAGNOSIS — M48061 Spinal stenosis, lumbar region without neurogenic claudication: Secondary | ICD-10-CM | POA: Diagnosis not present

## 2023-12-03 DIAGNOSIS — R079 Chest pain, unspecified: Secondary | ICD-10-CM | POA: Diagnosis not present

## 2023-12-03 DIAGNOSIS — Z8719 Personal history of other diseases of the digestive system: Secondary | ICD-10-CM | POA: Diagnosis not present

## 2023-12-03 DIAGNOSIS — J984 Other disorders of lung: Secondary | ICD-10-CM | POA: Diagnosis not present

## 2023-12-03 DIAGNOSIS — R5383 Other fatigue: Secondary | ICD-10-CM

## 2023-12-03 DIAGNOSIS — Z833 Family history of diabetes mellitus: Secondary | ICD-10-CM | POA: Diagnosis not present

## 2023-12-03 DIAGNOSIS — M48062 Spinal stenosis, lumbar region with neurogenic claudication: Secondary | ICD-10-CM | POA: Insufficient documentation

## 2023-12-03 DIAGNOSIS — D500 Iron deficiency anemia secondary to blood loss (chronic): Secondary | ICD-10-CM

## 2023-12-03 DIAGNOSIS — G3184 Mild cognitive impairment, so stated: Secondary | ICD-10-CM | POA: Diagnosis present

## 2023-12-03 DIAGNOSIS — Z8774 Personal history of (corrected) congenital malformations of heart and circulatory system: Secondary | ICD-10-CM | POA: Diagnosis not present

## 2023-12-03 DIAGNOSIS — D573 Sickle-cell trait: Secondary | ICD-10-CM | POA: Diagnosis present

## 2023-12-03 DIAGNOSIS — K219 Gastro-esophageal reflux disease without esophagitis: Secondary | ICD-10-CM | POA: Diagnosis present

## 2023-12-03 DIAGNOSIS — R0602 Shortness of breath: Secondary | ICD-10-CM | POA: Diagnosis not present

## 2023-12-03 DIAGNOSIS — Z6831 Body mass index (BMI) 31.0-31.9, adult: Secondary | ICD-10-CM | POA: Diagnosis not present

## 2023-12-03 DIAGNOSIS — D696 Thrombocytopenia, unspecified: Secondary | ICD-10-CM | POA: Diagnosis present

## 2023-12-03 DIAGNOSIS — N1831 Chronic kidney disease, stage 3a: Secondary | ICD-10-CM | POA: Diagnosis not present

## 2023-12-03 DIAGNOSIS — I7143 Infrarenal abdominal aortic aneurysm, without rupture: Secondary | ICD-10-CM | POA: Diagnosis not present

## 2023-12-03 DIAGNOSIS — J90 Pleural effusion, not elsewhere classified: Secondary | ICD-10-CM | POA: Diagnosis not present

## 2023-12-03 DIAGNOSIS — J45909 Unspecified asthma, uncomplicated: Secondary | ICD-10-CM | POA: Diagnosis present

## 2023-12-03 DIAGNOSIS — I739 Peripheral vascular disease, unspecified: Secondary | ICD-10-CM | POA: Diagnosis not present

## 2023-12-03 DIAGNOSIS — D649 Anemia, unspecified: Secondary | ICD-10-CM | POA: Diagnosis not present

## 2023-12-03 DIAGNOSIS — K5909 Other constipation: Secondary | ICD-10-CM

## 2023-12-03 DIAGNOSIS — M47816 Spondylosis without myelopathy or radiculopathy, lumbar region: Secondary | ICD-10-CM | POA: Diagnosis not present

## 2023-12-03 DIAGNOSIS — R4189 Other symptoms and signs involving cognitive functions and awareness: Secondary | ICD-10-CM | POA: Diagnosis not present

## 2023-12-03 DIAGNOSIS — K922 Gastrointestinal hemorrhage, unspecified: Secondary | ICD-10-CM | POA: Diagnosis not present

## 2023-12-03 DIAGNOSIS — J189 Pneumonia, unspecified organism: Secondary | ICD-10-CM | POA: Diagnosis not present

## 2023-12-03 DIAGNOSIS — Z7982 Long term (current) use of aspirin: Secondary | ICD-10-CM | POA: Diagnosis not present

## 2023-12-03 DIAGNOSIS — R151 Fecal smearing: Secondary | ICD-10-CM

## 2023-12-03 DIAGNOSIS — Z8249 Family history of ischemic heart disease and other diseases of the circulatory system: Secondary | ICD-10-CM | POA: Diagnosis not present

## 2023-12-03 DIAGNOSIS — K5520 Angiodysplasia of colon without hemorrhage: Secondary | ICD-10-CM | POA: Diagnosis not present

## 2023-12-03 DIAGNOSIS — I129 Hypertensive chronic kidney disease with stage 1 through stage 4 chronic kidney disease, or unspecified chronic kidney disease: Secondary | ICD-10-CM | POA: Diagnosis present

## 2023-12-03 DIAGNOSIS — K921 Melena: Secondary | ICD-10-CM

## 2023-12-03 DIAGNOSIS — D631 Anemia in chronic kidney disease: Secondary | ICD-10-CM | POA: Diagnosis not present

## 2023-12-03 DIAGNOSIS — Z87891 Personal history of nicotine dependence: Secondary | ICD-10-CM | POA: Diagnosis not present

## 2023-12-03 DIAGNOSIS — Z9889 Other specified postprocedural states: Secondary | ICD-10-CM | POA: Diagnosis not present

## 2023-12-03 DIAGNOSIS — E66811 Obesity, class 1: Secondary | ICD-10-CM | POA: Diagnosis present

## 2023-12-03 DIAGNOSIS — E1151 Type 2 diabetes mellitus with diabetic peripheral angiopathy without gangrene: Secondary | ICD-10-CM | POA: Diagnosis present

## 2023-12-03 DIAGNOSIS — E785 Hyperlipidemia, unspecified: Secondary | ICD-10-CM | POA: Diagnosis present

## 2023-12-03 DIAGNOSIS — D62 Acute posthemorrhagic anemia: Secondary | ICD-10-CM | POA: Diagnosis present

## 2023-12-03 DIAGNOSIS — K5900 Constipation, unspecified: Secondary | ICD-10-CM | POA: Diagnosis present

## 2023-12-03 DIAGNOSIS — N1832 Chronic kidney disease, stage 3b: Secondary | ICD-10-CM | POA: Diagnosis present

## 2023-12-03 DIAGNOSIS — K31819 Angiodysplasia of stomach and duodenum without bleeding: Secondary | ICD-10-CM | POA: Diagnosis not present

## 2023-12-03 DIAGNOSIS — E1122 Type 2 diabetes mellitus with diabetic chronic kidney disease: Secondary | ICD-10-CM | POA: Diagnosis present

## 2023-12-03 DIAGNOSIS — J449 Chronic obstructive pulmonary disease, unspecified: Secondary | ICD-10-CM | POA: Diagnosis not present

## 2023-12-03 DIAGNOSIS — Z79890 Hormone replacement therapy (postmenopausal): Secondary | ICD-10-CM | POA: Diagnosis not present

## 2023-12-03 DIAGNOSIS — K5521 Angiodysplasia of colon with hemorrhage: Secondary | ICD-10-CM | POA: Diagnosis present

## 2023-12-03 LAB — HEMOGLOBIN: Hemoglobin: 7.5 g/dL — CL (ref 12.0–15.0)

## 2023-12-03 LAB — HEMATOCRIT: HCT: 23.7 % — CL (ref 36.0–46.0)

## 2023-12-03 NOTE — Progress Notes (Addendum)
 Chief Complaint: follow-up iron  deficiency anemia  Primary GI Doctor: Dr. Federico   HPI: Lacey Vega is a 73 y.o. A.A. female with past medical history significant for  sickle cell trait, small bowel angioectasias, PUD, achalasia s/p POEM in 2019, asthma, chronic blood loss with iron  deficiency anemia and receives IV iron , DM2, HTN, HLD, AAA (on plavix ) s/p repair asthma, anxiety.  Patient was last seen in hospital for consultation for anemia, melena on 04/01/2023 by Dr. Avram. Work up in ED notable for : Creatinine 1.39/UN 18.  Hemoglobin 5.4 at infusion center, previously 9.2 on 11/12/2022.  WBCs 5.6.  Gross Melena noted on physical exam.  2 units of PRBC's ordered.   Patient was deviously admitted on 11/06/2022 for symptomatic anemia, fatigue, dark stools, and poor appetite.  Dr. 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  Treated with APC.  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     Patient was last seen by Dr. Federico at the office in February 2024 for follow up of IDA and SB angioectasias.  and at that time she was having intermittent black stools due to taking oral iron .  Was not able to take the Sandostatin  because it was too expensive.  11/16/23 seen by Hematology-reviewed entire note. labs today show WBC 5.5, Hgb 9.7, MCV 81.3, Plt 295. No need for IV blood but recommend IV feraheme  510 mg x 1 dose.   Interval History   Patient presents for follow-up on iron  deficiency, accompanied by her daughter. Patient taking oral iron  1 tablet po daily. She is also getting IV iron  infusions, pending appt to receive more IV iron .      She reports she had a dark tarry stool this past Tuesday. She reports she was having issues with having bowel movement. She took a Linzess  145 mcg and she reports first bowel movement was brown balls followed by another loose dark tarry stool.   She did not have another bowel movement today which she reports was brown and small balls again.  She also reports she has had some issues where she has a bowel movement and then leaves the restroom and then feels the urge to go again and notes that she has a small amount of stool that has leaked from her rectum.  She reports she is feeling more fatigued. She reports she has had to use her cane because she wasn't feeling steady on her feet.  We reviewed her medications and she is taking pantoprazole  40 mg p.o. daily. She reports she doesn't have much of an appetite. She has lost about 6lbs since March.  She denies abdominal pain, nausea or vomiting.  She has been off the Plavix  since last hospitalization in October 2024. She is taking baby ASA 81 mg po daily.   Wt Readings from Last 3 Encounters:  12/03/23 190 lb (86.2 kg)  11/16/23 191 lb 4.8 oz (86.8 kg)  08/25/23 196 lb 7 oz (89.1 kg)    Past Medical History:  Diagnosis Date   Anemia    Anxiety    Asthma    Asthma    Bronchitis    hx   Colon polyps    hyperplastic   Diabetes (HCC)    mild, history of   DJD (degenerative joint disease), cervical    Dysphagia    history of   Dyspnea    Gallstones    GERD (gastroesophageal  reflux disease)    H/O chest pain    secondary to anemia   Headache    History of arteriovenous malformation (AVM)    History of blood transfusion 10/2021   History of hiatal hernia    History of tobacco use    Hyperlipemia    Hypertension    Hypothyroidism    Iron  deficiency anemia    Sickle cell anemia (HCC)    patient is not aware of this   Thyroid  disease    TIA (transient ischemic attack)    was told that she possible had one   Wears dentures    Wears glasses     Past Surgical History:  Procedure Laterality Date   ABDOMINAL AORTIC ENDOVASCULAR STENT GRAFT Bilateral 01/31/2021   Procedure: ENDOVASCULAR ANEURYSM REPAIR (EVAR)Bilateral Groin Cutdown, left femoral endaterectomy with bovine  patch angioplasty.;  Surgeon: Magda Debby SAILOR, MD;  Location: [facility] OR;  Service: Vascular;  Laterality: Bilateral;   ABDOMINAL HYSTERECTOMY     ANTERIOR CERVICAL DECOMP/DISCECTOMY FUSION N/A 10/03/2016   Procedure: ANTERIOR CERVICAL DECOMPRESSION/DISCECTOMY FUSION CERVICAL THREE- CERVICAL FOUR;  Surgeon: Gillie Duncans, MD;  Location: [facility] OR;  Service: Neurosurgery;  Laterality: N/A;  Right side approach   BOTOX  INJECTION  08/29/2015   Procedure: BOTOX  INJECTION;  Surgeon: Jerrell Sol, MD;  Location: [facility] ENDOSCOPY;  Service: Endoscopy;;   CHOLECYSTECTOMY     COLONOSCOPY  12/03/2011   Procedure: COLONOSCOPY;  Surgeon: Jerrell KYM Sol, MD;  Location: [facility] ENDOSCOPY;  Service: Endoscopy;  Laterality: N/A;   COLONOSCOPY N/A 11/27/2021   Procedure: COLONOSCOPY;  Surgeon: Federico Rosario BROCKS, MD;  Location: [facility];  Service: Gastroenterology;  Laterality: N/A;   ENTEROSCOPY N/A 07/07/2021   Procedure: ENTEROSCOPY;  Surgeon: Rosalie Kitchens, MD;  Location: [facility] ENDOSCOPY;  Service: Endoscopy;  Laterality: N/A;   ENTEROSCOPY N/A 08/06/2021   Procedure: ENTEROSCOPY;  Surgeon: Saintclair Jasper, MD;  Location: [facility] ENDOSCOPY;  Service: Gastroenterology;  Laterality: N/A;   ENTEROSCOPY N/A 11/27/2021   Procedure: SMALL  BOWEL ENTEROSCOPY;  Surgeon: Federico Rosario BROCKS, MD;  Location: [facility] ENDOSCOPY;  Service: Gastroenterology;  Laterality: N/A;   ENTEROSCOPY N/A 11/07/2022   Procedure: ENTEROSCOPY;  Surgeon: Charlanne Groom, MD;  Location: [facility] ENDOSCOPY;  Service: Gastroenterology;  Laterality: N/A;   ENTEROSCOPY N/A 04/03/2023   Procedure: ENTEROSCOPY;  Surgeon: Avram Lupita BRAVO, MD;  Location: [facility] ENDOSCOPY;  Service: Gastroenterology;  Laterality: N/A;   ESOPHAGEAL MANOMETRY N/A 07/13/2017   Procedure: ESOPHAGEAL MANOMETRY (EM);  Surgeon: Sol Jerrell, MD;  Location: [facility]OS[facility];  Service: Endoscopy;  Laterality: N/A;   ESOPHAGOGASTRODUODENOSCOPY  12/03/2011   Procedure: ESOPHAGOGASTRODUODENOSCOPY (EGD);  Surgeon: Jerrell KYM Sol, MD;  Location: [facility];  Service: Endoscopy;  Laterality: N/A;   ESOPHAGOGASTRODUODENOSCOPY (EGD) WITH PROPOFOL  N/A 08/29/2015   Procedure: ESOPHAGOGASTRODUODENOSCOPY (EGD) WITH PROPOFOL ;  Surgeon: Jerrell Sol, MD;  Location: [facility] ENDOSCOPY;  Service: Endoscopy;  Laterality: N/A;   ESOPHAGOGASTRODUODENOSCOPY (EGD) WITH PROPOFOL  N/A 05/17/2019   Procedure: ESOPHAGOGASTRODUODENOSCOPY (EGD) WITH PROPOFOL ;  Surgeon: Sol Jerrell, MD;  Location: [facility];  Service: Endoscopy;  Laterality: N/A;   ESOPHAGOGASTRODUODENOSCOPY (EGD) WITH PROPOFOL  N/A 04/24/2021   Procedure: ESOPHAGOGASTRODUODENOSCOPY (EGD) WITH PROPOFOL ;  Surgeon: Elicia Claw, MD;  Location: [facility] ENDOSCOPY;  Service: Gastroenterology;  Laterality: N/A;   GIVENS CAPSULE STUDY N/A 08/05/2018   Procedure: GIVENS CAPSULE STUDY;  Surgeon: Sol Jerrell, MD;  Location: [facility] ENDOSCOPY;  Service: Endoscopy;  Laterality: N/A;   GIVENS CAPSULE STUDY N/A 07/06/2021   Procedure: GIVENS CAPSULE STUDY;  Surgeon: Rosalie Kitchens, MD;  Location: [facility] ENDOSCOPY;  Service: Endoscopy;  Laterality: N/A;   HOT HEMOSTASIS  12/03/2011   Procedure: HOT HEMOSTASIS (ARGON PLASMA COAGULATION/BICAP);  Surgeon: Jerrell KYM Sol, MD;  Location: [facility];  Service: Endoscopy;  Laterality: N/A;   HOT HEMOSTASIS N/A 07/07/2021   Procedure: HOT HEMOSTASIS (ARGON PLASMA COAGULATION/BICAP);  Surgeon: Rosalie Kitchens, MD;  Location: [facility];  Service: Endoscopy;  Laterality: N/A;   HOT HEMOSTASIS N/A 08/06/2021   Procedure: HOT HEMOSTASIS (ARGON PLASMA COAGULATION/BICAP);  Surgeon: Saintclair Jasper, MD;  Location: [facility];  Service: Gastroenterology;  Laterality: N/A;   HOT HEMOSTASIS  11/27/2021   Procedure: HOT HEMOSTASIS (ARGON PLASMA COAGULATION/BICAP);  Surgeon: Federico Rosario BROCKS, MD;  Location: [facility];  Service: Gastroenterology;;  EGD and COLON   HOT HEMOSTASIS N/A 11/07/2022   Procedure: HOT HEMOSTASIS (ARGON PLASMA COAGULATION/BICAP);   Surgeon: Charlanne Groom, MD;  Location: [facility];  Service: Gastroenterology;  Laterality: N/A;   HOT HEMOSTASIS N/A 04/03/2023   Procedure: HOT HEMOSTASIS (ARGON PLASMA COAGULATION/BICAP);  Surgeon: Avram Lupita BRAVO, MD;  Location: [facility];  Service: Gastroenterology;  Laterality: N/A;   NECK SURGERY     PERIPHERAL VASCULAR INTERVENTION  03/29/2021   Procedure: PERIPHERAL VASCULAR INTERVENTION;  Surgeon: Magda Debby SAILOR, MD;  Location: [facility] INVASIVE CV LAB;  Service: Cardiovascular;;  Lt SFA Brachial Approach   POLYPECTOMY  11/27/2021   Procedure: POLYPECTOMY;  Surgeon: Federico Rosario BROCKS, MD;  Location: [facility];  Service: Gastroenterology;;   ROTATOR CUFF REPAIR     right   ROTATOR CUFF REPAIR Right 2014   THROMBECTOMY FEMORAL ARTERY Left 01/31/2021   Procedure: THROMBECTOMY POPLITEAL  ARTERY;  Surgeon: Magda Debby SAILOR, MD;  Location: [facility] OR;  Service: Vascular;  Laterality: Left;   TONSILLECTOMY      No current facility-administered medications for this visit.   Current Outpatient Medications  Medication Sig Dispense Refill   acetaminophen  (TYLENOL ) 500 MG tablet Take 500-1,000 mg by mouth every 6 (six) hours as needed for mild pain or headache.     albuterol  (VENTOLIN  HFA) 108 (90 Base) MCG/ACT inhaler Inhale 1-2 puffs into the lungs every 6 (six) hours as needed for wheezing or shortness of breath.     ascorbic acid (VITAMIN C) 500 MG tablet Take 500 mg by mouth daily.     aspirin  EC 81 MG tablet Take 81 mg by mouth daily. Swallow whole.     atorvastatin  (LIPITOR) 40 MG tablet Take 40 mg by mouth at bedtime.     Biotin 2500 MCG CHEW Chew 5,000 mcg by mouth daily in the afternoon.     Cinnamon 500 MG TABS Take 1,000 mg by mouth every evening.     cyanocobalamin  (VITAMIN B12) 1000 MCG tablet Take 1,000 mcg by mouth daily.     donepezil  (ARICEPT ) 5 MG tablet Take 5 mg by mouth at bedtime.     ferrous gluconate  (FERGON) 324 MG tablet Take 324 mg by mouth daily with breakfast.      folic acid  (FOLVITE ) 800 MCG tablet Take 800 mcg by mouth daily.     levothyroxine  (SYNTHROID ) 125 MCG tablet Take 125 mcg by mouth See admin instructions. Take 125 mcg by mouth before breakfast on Mon/Tues/Wed/Thurs/Fri/Sat (take nothing on Sundays)     LINZESS  145 MCG CAPS capsule TAKE 1 CAPSULE BY MOUTH DAILY BEFORE BREAKFAST. 30 capsule 6   montelukast  (SINGULAIR ) 10 MG tablet Take 10 mg by mouth daily in the afternoon.     nitroGLYCERIN  (NITROSTAT ) 0.4 MG SL tablet Place  0.4 mg under the tongue every 5 (five) minutes as needed for chest pain.     Omega-3 Fatty Acids (FISH OIL BURP-LESS) 1200 MG CAPS Take 1,200 mg by mouth daily in the afternoon.     pantoprazole  (PROTONIX ) 40 MG tablet Take 1 tablet (40 mg total) by mouth daily before breakfast. 90 tablet 1   WIXELA INHUB 250-50 MCG/ACT AEPB Inhale 1 puff into the lungs in the morning and at bedtime.     levalbuterol (XOPENEX) 0.63 MG/3ML nebulizer solution Take 0.63 mg by nebulization every 6 (six) hours as needed for wheezing or shortness of breath.     spironolactone (ALDACTONE) 25 MG tablet Take 25 mg by mouth daily.     Facility-Administered Medications Ordered in Other Visits  Medication Dose Route Frequency Provider Last Rate Last Admin   0.9 %  sodium chloride  infusion (Manually program via Guardrails IV Fluids)   Intravenous Once Nivia Colon, PA-C       0.9 %  sodium chloride  infusion (Manually program via Guardrails IV Fluids)   Intravenous Once Claudene Maximino LABOR, MD       acetaminophen  (TYLENOL ) tablet 650 mg  650 mg Oral Q6H PRN Claudene Maximino LABOR, MD       Or   acetaminophen  (TYLENOL ) suppository 650 mg  650 mg Rectal Q6H PRN Claudene Maximino A, MD       albuterol  (PROVENTIL ) (2.5 MG/3ML) 0.083% nebulizer solution 2.5 mg  2.5 mg Nebulization Q2H PRN Claudene Maximino LABOR, MD       [START ON 12/05/2023] atorvastatin  (LIPITOR) tablet 40 mg  40 mg Oral QHS Smith, Rondell A, MD       calcium  gluconate 2 g/ 100 mL sodium chloride  IVPB  2 g  Intravenous Once Claudene Maximino A, MD       donepezil  (ARICEPT ) tablet 5 mg  5 mg Oral QHS Claudene Maximino LABOR, MD       [START ON 12/05/2023] fluticasone  furoate-vilanterol (BREO ELLIPTA ) 200-25 MCG/ACT 1 puff  1 puff Inhalation Daily Claudene Maximino LABOR, MD       [START ON 12/05/2023] levothyroxine  (SYNTHROID ) tablet 125 mcg  125 mcg Oral Once per day on Monday Tuesday Wednesday Thursday Friday Saturday Claudene Maximino LABOR, MD       montelukast  (SINGULAIR ) tablet 10 mg  10 mg Oral Q1500 Claudene Maximino LABOR, MD       pantoprazole  (PROTONIX ) EC tablet 40 mg  40 mg Oral BID Claudene Maximino A, MD        Allergies as of 12/03/2023 - Review Complete 12/03/2023  Allergen Reaction Noted   Oxycodone Shortness Of Breath 03/12/2012    Family History  Problem Relation Age of Onset   Diabetes Mother    Heart attack Brother    Cancer Maternal Aunt     Review of Systems:    Constitutional: No weight loss, fever, chills, weakness or fatigue HEENT: Eyes: No change in vision               Ears, Nose, Throat:  No change in hearing or congestion Skin: No rash or itching Cardiovascular: No chest pain, chest pressure or palpitations   Respiratory: No SOB or cough Gastrointestinal: See HPI and otherwise negative Genitourinary: No dysuria or change in urinary frequency Neurological: No headache, dizziness or syncope Musculoskeletal: No new muscle or joint pain Hematologic: No bleeding or bruising Psychiatric: No history of depression or anxiety    Physical Exam:  Vital signs: BP (!) 118/50   Pulse 86  Ht 5' 5 (1.651 m)   Wt 190 lb (86.2 kg)   BMI 31.62 kg/m   Constitutional:   Pleasant  A.A. female appears to be in NAD, Well developed, Well nourished, alert and cooperative Throat: Oral cavity and pharynx without inflammation, swelling or lesion.  Respiratory: Respirations even and unlabored. Lungs clear to auscultation bilaterally.   No wheezes, crackles, or rhonchi.  Cardiovascular: Normal S1, S2. Regular  rate and rhythm. No peripheral edema, cyanosis or pallor.  Gastrointestinal:  Soft, nondistended, nontender. No rebound or guarding. Normal bowel sounds. No appreciable masses or hepatomegaly. Rectal: Normal external rectal exam, weak rectal tone, , non-tender, no masses, , brown stool, hemoccult Positive.  Patient on oral iron . Msk:  Symmetrical without gross deformities. Without edema, no deformity or joint abnormality.  Neurologic:  Alert and  oriented x4;  grossly normal neurologically.  Skin:   Dry and intact without significant lesions or rashes. Psychiatric: Oriented to person, place and time. Demonstrates good judgement and reason without abnormal affect or behaviors.  RELEVANT LABS AND IMAGING: CBC    Latest Ref Rng & Units 12/04/2023    2:34 PM 12/04/2023   11:17 AM 12/03/2023    4:08 PM  CBC  WBC 4.0 - 10.5 K/uL  4.5    Hemoglobin 12.0 - 15.0 g/dL 6.4  7.1  7.5   Hematocrit 36.0 - 46.0 % 20.7  23.0  23.7   Platelets 150 - 400 K/uL  140       CMP     Latest Ref Rng & Units 12/04/2023   11:17 AM 11/16/2023   10:54 AM 06/30/2023   10:59 AM  CMP  Glucose 70 - 99 mg/dL 896  98  87   BUN 8 - 23 mg/dL 22  16  15    Creatinine 0.44 - 1.00 mg/dL 8.57  8.70  8.50   Sodium 135 - 145 mmol/L 139  144  141   Potassium 3.5 - 5.1 mmol/L 4.2  3.5  5.1   Chloride 98 - 111 mmol/L 108  108  108   CO2 22 - 32 mmol/L 24  28  28    Calcium  8.9 - 10.3 mg/dL 8.3  9.2  9.7   Total Protein 6.5 - 8.1 g/dL 6.4  7.4  7.5   Total Bilirubin 0.0 - 1.2 mg/dL 0.7  0.7  0.8   Alkaline Phos 38 - 126 U/L 89  108  112   AST 15 - 41 U/L 18  14  16    ALT 0 - 44 U/L 9  7  7       Lab Results  Component Value Date   TSH <0.01 Repeated and verified X2. (L) 11/08/2021    Previous GI workup:    VCE 08/05/18: Small non-bleeding gastric AVM. Small nonbleeding proximal small bowel AVM. Duodenal erosion   EGD 05/17/19: - LA Grade C esophagitis with bleeding. - Normal stomach. - A few small non-bleeding  angiodysplastic lesions in the duodenum. - Mucosal changes in the duodenum.   EGD 04/24/21: - Z-line regular, 40 cm from the incisors. - Gastritis with hemorrhage. - Normal duodenal bulb, first portion of the duodenum, second portion of the duodenum and third portion of the duodenum. - No specimens collected.   SBE 07/06/21 Findings: First gastric image 00:01:29 First duodenal image 00:15:34 First cecal image 02:05:04 Blood visualized in the stomach. Duodenal angioectasia visualized at 00:15:44, 00:15:52, and 00:16:23.  Blood clot with possible active bleeding visualized at 00:21:13 and 00:21:23. Summary  and Recommendations: Recommend SBE for treatment of duodenal angioectasias and evaluation of bleeding source from the stomach and proximal small bowel. Spoke with Dr. Rosalie and he will plan to do this procedure tomorrow. Made NPO after MN in preparation.   VCE 07/07/21: - Normal esophagus. - A single non-bleeding angiodysplastic lesion in the stomach. Treated with argon plasma coagulation (APC). - Three non-bleeding angiodysplastic lesions in the duodenum. Treated with argon plasma coagulation (APC). - The examined portion of the jejunum was normal. - No specimens collected.   SBE 08/06/21: - LA Grade A esophagitis with bleeding. - Non-bleeding gastric ulcer with a clean ulcer base (Forrest Class III). - Multiple recently bleeding angioectasias in the duodenum. Treated with argon plasma coagulation (APC). - No specimens collected.   SBE 11/27/21: - LA Grade A esophagitis with no bleeding. - Mild gastritis. - Five non-bleeding angioectasias in the duodenum. Treated with argon plasma coagulation (APC). - Two non-bleeding angioectasias in the jejunum. Treated with argon plasma coagulation (APC). - No specimens collected.   Colonoscopy 11/27/21: Repeat 6 years pending age and co morbidities at that time - The examined portion of the ileum was normal. - Three 3 to 4 mm polyps in the  transverse colon and in the ascending colon, removed with a cold snare. Resected and retrieved. - A single non-bleeding colonic angioectasia. Treated with argon plasma coagulation (APC). - Diverticulosis in the sigmoid colon, in the descending colon, in the transverse colon and in the ascending colon. - Non-bleeding internal hemorrhoids. - Anal wart. Path: A. TRANSVERSE COLON, POLYPECTOMY:  Benign colonic mucosa with lymphoid aggregate  B. ASCENDING COLON, POLYPECTOMY:  Tubular adenoma  Negative for high-grade dysplasia and carcinoma    11/07/2022 Small bowel endoscopy with Dr. 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  Treated with APC.  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    04/03/2023 Small bowel endoscopy with Dr. Avram - A single non- bleeding angiodysplastic lesion in the duodenum. Treated with argon plasma coagulation ( APC) . - The examined portion of the jejunum was normal. - Normal esophagus. - Normal stomach. - No specimens collected.  Assessment: Encounter Diagnoses  Name Primary?   Iron  deficiency anemia due to chronic blood loss Yes   History of arteriovenous malformation (AVM)    Melena    Chronic constipation    Fecal smearing    Other fatigue         73 year old African-American female patient with history of Iron  deficiency anemia secondary to chronic blood loss, source of blood loss previously identified as AVMs in the GI tract. Likely a strong contribution from the patient's sickle cell trait preventing her from reaching a normal Hgb. Baseline Hgb is approximately 10.  Hgb 9.7 (11/2023) stable.  Patient was taken off Plavix  during last hospitalization and has only been taking baby aspirin .  Patient is taking daily oral iron  as well as receiving IV iron  infusions with hematology.  Patient presents today with complaint of 1 dark tarry stool on Tuesday and overall feeling more  fatigue and weakness.  Positive fecal occult today however patient is on iron  supplementation which Maryl Blalock result in false positive.  Will go ahead and recheck stat H&H and if there is significant drop we will discuss case with Dr. Federico about possible small bowel endoscopy.      For the history of constipation I did recommend patient take the Linzess  more frequently to prevent stool buildup.  For the stool leakage we discussed adding fiber to her diet she prefers Gummies she can use fiber Gummies.  During the DRE she did have weak and anal sphincter muscle.  Plan: - Check H/H today -Continue oral iron  po daily - Continue seeing hematology for IV iron  infusions as needed - Orvil Faraone consider small bowel endoscopy pending blood levels -Recommend she continue Linzess  145 mcg po daily prn constipation -Add Benefiberfiber supplementation daily - Recall colonoscopy 11/19/1928 depending on patient's age and comorbidities at that time  ADDENDUM: Hgb came back at 7.5 , patient directed to hospital for admission.  Thank you for the courtesy of this consult. Please call me with any questions or concerns.   Lacey Kotarski, FNP-C [facility] 12/04/2023, 3:13 PM  Cc: Lacey Camellia CROME, MD

## 2023-12-03 NOTE — Telephone Encounter (Signed)
 Called both numbers on file and left message.  Patient has critical hemoglobin level 7.5.  Discussed with Dr. Federico who is on-call and primary physician.  We both agreed he would be appropriate for patient to go ahead to [facility] in case she does need any endoscopic procedures or blood transfusions.

## 2023-12-03 NOTE — Patient Instructions (Addendum)
 Please go to the lab in the basement of our building to have lab work done as you leave today. Hit B for basement when you get on the elevator.  When the doors open the lab is on your left.  We will call you with the results. Thank you.   Thank you for entrusting me with your care and for choosing [facility], Cathryne Cork, NP   If your blood pressure at your visit was 140/90 or greater, please contact your primary care physician to follow up on this. ______________________________________________________  If you are age 92 or older, your body mass index should be between 23-30. Your Body mass index is 31.62 kg/m. If this is out of the aforementioned range listed, please consider follow up with your Primary Care Provider.  If you are age 74 or younger, your body mass index should be between 19-25. Your Body mass index is 31.62 kg/m. If this is out of the aformentioned range listed, please consider follow up with your Primary Care Provider.  ________________________________________________________  The [facility] providers would like to encourage you to use MYCHART to communicate with providers for non-urgent requests or questions.  Due to long hold times on the telephone, sending your provider a message by [facility] may be a faster and more efficient way to get a response.  Please allow 48 business hours for a response.  Please remember that this is for non-urgent requests.  _______________________________________________________  Due to recent changes in healthcare laws, you may see the results of your imaging and laboratory studies on MyChart before your provider has had a chance to review them.  We understand that in some cases there may be results that are confusing or concerning to you. Not all laboratory results come back in the same time frame and the provider may be waiting for multiple results in order to interpret others.  Please give us  48 hours in order for your provider to  thoroughly review all the results before contacting the office for clarification of your results.

## 2023-12-04 ENCOUNTER — Other Ambulatory Visit: Payer: Self-pay

## 2023-12-04 ENCOUNTER — Encounter (HOSPITAL_COMMUNITY): Payer: Self-pay

## 2023-12-04 ENCOUNTER — Inpatient Hospital Stay (HOSPITAL_COMMUNITY)
Admission: EM | Admit: 2023-12-04 | Discharge: 2023-12-07 | DRG: 377 | Disposition: A | Discharge status: Home / Self Care | Attending: Internal Medicine | Admitting: Internal Medicine

## 2023-12-04 DIAGNOSIS — Z860100 Personal history of colon polyps, unspecified: Secondary | ICD-10-CM

## 2023-12-04 DIAGNOSIS — I129 Hypertensive chronic kidney disease with stage 1 through stage 4 chronic kidney disease, or unspecified chronic kidney disease: Secondary | ICD-10-CM | POA: Diagnosis present

## 2023-12-04 DIAGNOSIS — Z7982 Long term (current) use of aspirin: Secondary | ICD-10-CM | POA: Diagnosis not present

## 2023-12-04 DIAGNOSIS — K5521 Angiodysplasia of colon with hemorrhage: Secondary | ICD-10-CM | POA: Diagnosis present

## 2023-12-04 DIAGNOSIS — D649 Anemia, unspecified: Secondary | ICD-10-CM | POA: Diagnosis not present

## 2023-12-04 DIAGNOSIS — E039 Hypothyroidism, unspecified: Secondary | ICD-10-CM | POA: Diagnosis present

## 2023-12-04 DIAGNOSIS — Z79890 Hormone replacement therapy (postmenopausal): Secondary | ICD-10-CM | POA: Diagnosis not present

## 2023-12-04 DIAGNOSIS — D696 Thrombocytopenia, unspecified: Secondary | ICD-10-CM | POA: Diagnosis present

## 2023-12-04 DIAGNOSIS — N1832 Chronic kidney disease, stage 3b: Secondary | ICD-10-CM | POA: Diagnosis present

## 2023-12-04 DIAGNOSIS — D573 Sickle-cell trait: Secondary | ICD-10-CM | POA: Diagnosis present

## 2023-12-04 DIAGNOSIS — E1122 Type 2 diabetes mellitus with diabetic chronic kidney disease: Secondary | ICD-10-CM | POA: Diagnosis present

## 2023-12-04 DIAGNOSIS — E669 Obesity, unspecified: Secondary | ICD-10-CM

## 2023-12-04 DIAGNOSIS — K921 Melena: Secondary | ICD-10-CM

## 2023-12-04 DIAGNOSIS — K922 Gastrointestinal hemorrhage, unspecified: Principal | ICD-10-CM | POA: Diagnosis present

## 2023-12-04 DIAGNOSIS — Z7902 Long term (current) use of antithrombotics/antiplatelets: Secondary | ICD-10-CM

## 2023-12-04 DIAGNOSIS — E66811 Obesity, class 1: Secondary | ICD-10-CM | POA: Diagnosis present

## 2023-12-04 DIAGNOSIS — Z8249 Family history of ischemic heart disease and other diseases of the circulatory system: Secondary | ICD-10-CM

## 2023-12-04 DIAGNOSIS — Z981 Arthrodesis status: Secondary | ICD-10-CM

## 2023-12-04 DIAGNOSIS — J984 Other disorders of lung: Secondary | ICD-10-CM | POA: Diagnosis not present

## 2023-12-04 DIAGNOSIS — J449 Chronic obstructive pulmonary disease, unspecified: Secondary | ICD-10-CM | POA: Diagnosis not present

## 2023-12-04 DIAGNOSIS — Z6831 Body mass index (BMI) 31.0-31.9, adult: Secondary | ICD-10-CM | POA: Diagnosis not present

## 2023-12-04 DIAGNOSIS — Z833 Family history of diabetes mellitus: Secondary | ICD-10-CM

## 2023-12-04 DIAGNOSIS — Z8719 Personal history of other diseases of the digestive system: Principal | ICD-10-CM

## 2023-12-04 DIAGNOSIS — K31819 Angiodysplasia of stomach and duodenum without bleeding: Secondary | ICD-10-CM | POA: Diagnosis not present

## 2023-12-04 DIAGNOSIS — I739 Peripheral vascular disease, unspecified: Secondary | ICD-10-CM | POA: Diagnosis present

## 2023-12-04 DIAGNOSIS — J45909 Unspecified asthma, uncomplicated: Secondary | ICD-10-CM | POA: Diagnosis present

## 2023-12-04 DIAGNOSIS — R4189 Other symptoms and signs involving cognitive functions and awareness: Secondary | ICD-10-CM | POA: Diagnosis not present

## 2023-12-04 DIAGNOSIS — K219 Gastro-esophageal reflux disease without esophagitis: Secondary | ICD-10-CM | POA: Diagnosis present

## 2023-12-04 DIAGNOSIS — E1151 Type 2 diabetes mellitus with diabetic peripheral angiopathy without gangrene: Secondary | ICD-10-CM | POA: Diagnosis present

## 2023-12-04 DIAGNOSIS — K5520 Angiodysplasia of colon without hemorrhage: Secondary | ICD-10-CM | POA: Diagnosis not present

## 2023-12-04 DIAGNOSIS — Z87891 Personal history of nicotine dependence: Secondary | ICD-10-CM

## 2023-12-04 DIAGNOSIS — Z8673 Personal history of transient ischemic attack (TIA), and cerebral infarction without residual deficits: Secondary | ICD-10-CM

## 2023-12-04 DIAGNOSIS — K5900 Constipation, unspecified: Secondary | ICD-10-CM | POA: Diagnosis present

## 2023-12-04 DIAGNOSIS — E785 Hyperlipidemia, unspecified: Secondary | ICD-10-CM | POA: Diagnosis present

## 2023-12-04 DIAGNOSIS — D62 Acute posthemorrhagic anemia: Secondary | ICD-10-CM | POA: Diagnosis present

## 2023-12-04 DIAGNOSIS — J90 Pleural effusion, not elsewhere classified: Secondary | ICD-10-CM | POA: Diagnosis not present

## 2023-12-04 DIAGNOSIS — Z9889 Other specified postprocedural states: Secondary | ICD-10-CM | POA: Diagnosis not present

## 2023-12-04 DIAGNOSIS — J189 Pneumonia, unspecified organism: Secondary | ICD-10-CM | POA: Diagnosis not present

## 2023-12-04 DIAGNOSIS — Z79899 Other long term (current) drug therapy: Secondary | ICD-10-CM

## 2023-12-04 DIAGNOSIS — G3184 Mild cognitive impairment, so stated: Secondary | ICD-10-CM | POA: Diagnosis present

## 2023-12-04 DIAGNOSIS — Z90710 Acquired absence of both cervix and uterus: Secondary | ICD-10-CM

## 2023-12-04 DIAGNOSIS — K31811 Angiodysplasia of stomach and duodenum with bleeding: Principal | ICD-10-CM | POA: Diagnosis present

## 2023-12-04 DIAGNOSIS — Z8711 Personal history of peptic ulcer disease: Secondary | ICD-10-CM

## 2023-12-04 DIAGNOSIS — Z885 Allergy status to narcotic agent status: Secondary | ICD-10-CM

## 2023-12-04 DIAGNOSIS — R0602 Shortness of breath: Secondary | ICD-10-CM | POA: Diagnosis not present

## 2023-12-04 DIAGNOSIS — R079 Chest pain, unspecified: Secondary | ICD-10-CM | POA: Diagnosis not present

## 2023-12-04 LAB — HEMOGLOBIN AND HEMATOCRIT, BLOOD
HCT: 20.7 % — ABNORMAL LOW (ref 36.0–46.0)
Hemoglobin: 6.4 g/dL — CL (ref 12.0–15.0)

## 2023-12-04 LAB — COMPREHENSIVE METABOLIC PANEL WITH GFR
ALT: 9 U/L (ref 0–44)
AST: 18 U/L (ref 15–41)
Albumin: 3.4 g/dL — ABNORMAL LOW (ref 3.5–5.0)
Alkaline Phosphatase: 89 U/L (ref 38–126)
Anion gap: 7 (ref 5–15)
BUN: 22 mg/dL (ref 8–23)
CO2: 24 mmol/L (ref 22–32)
Calcium: 8.3 mg/dL — ABNORMAL LOW (ref 8.9–10.3)
Chloride: 108 mmol/L (ref 98–111)
Creatinine, Ser: 1.42 mg/dL — ABNORMAL HIGH (ref 0.44–1.00)
GFR, Estimated: 39 mL/min — ABNORMAL LOW (ref >60)
Glucose, Bld: 103 mg/dL — ABNORMAL HIGH (ref 70–99)
Potassium: 4.2 mmol/L (ref 3.5–5.1)
Sodium: 139 mmol/L (ref 135–145)
Total Bilirubin: 0.7 mg/dL (ref 0.0–1.2)
Total Protein: 6.4 g/dL — ABNORMAL LOW (ref 6.5–8.1)

## 2023-12-04 LAB — CBC
HCT: 23 % — ABNORMAL LOW (ref 36.0–46.0)
Hemoglobin: 7.1 g/dL — ABNORMAL LOW (ref 12.0–15.0)
MCH: 26.3 pg (ref 26.0–34.0)
MCHC: 30.9 g/dL (ref 30.0–36.0)
MCV: 85.2 fL (ref 80.0–100.0)
Platelets: 140 K/uL — ABNORMAL LOW (ref 150–400)
RBC: 2.7 MIL/uL — ABNORMAL LOW (ref 3.87–5.11)
RDW: 15.9 % — ABNORMAL HIGH (ref 11.5–15.5)
WBC: 4.5 K/uL (ref 4.0–10.5)
nRBC: 0 % (ref 0.0–0.2)

## 2023-12-04 LAB — POC OCCULT BLOOD, ED: Fecal Occult Bld: POSITIVE — AB

## 2023-12-04 LAB — PREPARE RBC (CROSSMATCH)

## 2023-12-04 LAB — TROPONIN I (HIGH SENSITIVITY): Troponin I (High Sensitivity): 5 ng/L (ref <18)

## 2023-12-04 MED ORDER — ALBUTEROL SULFATE (2.5 MG/3ML) 0.083% IN NEBU: 2.5000 mg | INHALATION_SOLUTION | RESPIRATORY_TRACT | Status: DC | PRN

## 2023-12-04 MED ORDER — ATORVASTATIN CALCIUM 40 MG PO TABS
40.0000 mg | ORAL_TABLET | Freq: Every day | ORAL | Status: DC
  Administered 2023-12-05 – 2023-12-06 (×2): 40 mg via ORAL
  Filled 2023-12-04 (×2): qty 1

## 2023-12-04 MED ORDER — PANTOPRAZOLE SODIUM 40 MG PO TBEC: 40.0000 mg | DELAYED_RELEASE_TABLET | Freq: Every day | ORAL | Status: DC

## 2023-12-04 MED ORDER — SODIUM CHLORIDE 0.9% IV SOLUTION: Freq: Once | INTRAVENOUS | Status: AC

## 2023-12-04 MED ORDER — SODIUM CHLORIDE 0.9% IV SOLUTION
Freq: Once | INTRAVENOUS | Status: DC
Start: 2023-12-04 — End: 2023-12-07

## 2023-12-04 MED ORDER — LEVOTHYROXINE SODIUM 25 MCG PO TABS
125.0000 ug | ORAL_TABLET | ORAL | Status: DC
  Administered 2023-12-05 – 2023-12-07 (×2): 125 ug via ORAL
  Filled 2023-12-04 (×2): qty 1

## 2023-12-04 MED ORDER — PANTOPRAZOLE SODIUM 40 MG PO TBEC
40.0000 mg | DELAYED_RELEASE_TABLET | Freq: Two times a day (BID) | ORAL | Status: DC
  Administered 2023-12-04: 40 mg via ORAL
  Filled 2023-12-04: qty 1

## 2023-12-04 MED ORDER — DONEPEZIL HCL 5 MG PO TABS
5.0000 mg | ORAL_TABLET | Freq: Every day | ORAL | Status: DC
  Administered 2023-12-04 – 2023-12-06 (×3): 5 mg via ORAL
  Filled 2023-12-04 (×3): qty 1

## 2023-12-04 MED ORDER — ACETAMINOPHEN 325 MG PO TABS
650.0000 mg | ORAL_TABLET | Freq: Four times a day (QID) | ORAL | Status: DC | PRN
  Administered 2023-12-05: 650 mg via ORAL
  Filled 2023-12-04: qty 2

## 2023-12-04 MED ORDER — FLUTICASONE FUROATE-VILANTEROL 200-25 MCG/ACT IN AEPB
1.0000 | INHALATION_SPRAY | Freq: Every day | RESPIRATORY_TRACT | Status: DC
  Administered 2023-12-05 – 2023-12-07 (×3): 1 via RESPIRATORY_TRACT
  Filled 2023-12-04 (×2): qty 28

## 2023-12-04 MED ORDER — MONTELUKAST SODIUM 10 MG PO TABS
10.0000 mg | ORAL_TABLET | Freq: Every day | ORAL | Status: DC
  Administered 2023-12-04 – 2023-12-06 (×3): 10 mg via ORAL
  Filled 2023-12-04 (×4): qty 1

## 2023-12-04 MED ORDER — ACETAMINOPHEN 650 MG RE SUPP: 650.0000 mg | Freq: Four times a day (QID) | RECTAL | Status: DC | PRN

## 2023-12-04 MED ORDER — CALCIUM GLUCONATE-NACL 2-0.675 GM/100ML-% IV SOLN
2.0000 g | Freq: Once | INTRAVENOUS | Status: AC
  Administered 2023-12-04: 2000 mg via INTRAVENOUS
  Filled 2023-12-04: qty 100

## 2023-12-04 NOTE — Plan of Care (Signed)

## 2023-12-04 NOTE — ED Notes (Signed)
 5W secretary called to let know the patient would be coming up. Transport notified.

## 2023-12-04 NOTE — H&P (View-Only) (Signed)
 Consultation  Referring Provider: TRH/Smith Primary Care Physician:  Addie Camellia CROME, MD Primary Gastroenterologist:  Dr. Federico  Reason for Consultation: Anemia, melena small bowel AVMs  HPI: Lacey Vega is a 73 y.o. female known to Dr. Federico with previous history of peptic ulcer disease, history of achalasia status post p.o. EM 2019, asthma, chronic blood loss/iron  deficiency anemia requiring intermittent IV iron , diabetes mellitus and hypertension.  She does have history of sickle cell trait.  Patient has history of peripheral arterial disease with previous superficial femoral artery stenting 2022.  Workup in January 2024 with smaller aneurysms recurring in the area of endovascular repair.  Subsequent duplex imaging has not shown any evidence of endoleak. She had previously been on Plavix  and aspirin , over the past couple of years just aspirin . 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 Due to recurrent anemia, she had enteroscopy again and November 2024 at that time with finding of 1 duodenal AVM treated with APC.  She is followed chronically by hematology, has required intermittent IV iron . She was seen in our office yesterday after labs on 11/16/2023 had shown hemoglobin of 9.7/MCV 81/platelets 295.  She did receive IV Feraheme  per hematology at that time. She had a more overtly melenic stool on Tuesday of this week after taking her Linzess  for constipation.  Prior to that she had not really been noticing any dark or black stools.  She has not had a bowel movement since then.  She has no complaints of abdominal pain.  She has been feeling more fatigued over the past week or so and has noted more dizziness and kind of unsteady sensation with ambulation having to use her cane. She is also been undergoing evaluation  for back pain.  Labs in the office yesterday showed hemoglobin dropped to 7.5 and she was advised to come to the ER. She presented to the ER this morning.  Labs show WBC 4.5/hemoglobin 7.1/hematocrit 33.0 MCV 85 Sodium 139/potassium 4.7 BUN 22/creatinine 1.72 and LFTs within normal limits  She is receiving 2 units of packed RBCs.  Last colonoscopy June 2023-3 sessile polyps removed 3 to 4 mm in size, 1 small AVM found in the ascending colon treated with APC and a few diverticuli in the sigmoid descending transverse and ascending colon.   Past Medical History:  Diagnosis Date   Anemia    Anxiety    Asthma    Asthma    Bronchitis    hx   Colon polyps    hyperplastic   Diabetes (HCC)    mild, history of   DJD (degenerative joint disease), cervical    Dysphagia    history of   Dyspnea    Gallstones    GERD (gastroesophageal reflux disease)    H/O chest pain    secondary to anemia   Headache    History of arteriovenous malformation (AVM)    History of blood transfusion 10/2021   History of hiatal hernia    History of tobacco use    Hyperlipemia    Hypertension    Hypothyroidism    Iron  deficiency anemia    Sickle cell anemia (HCC)    patient is not aware of this   Thyroid  disease    TIA (transient ischemic attack)    was told that she possible  had one   Wears dentures    Wears glasses     Past Surgical History:  Procedure Laterality Date   ABDOMINAL AORTIC ENDOVASCULAR STENT GRAFT Bilateral 01/31/2021   Procedure: ENDOVASCULAR ANEURYSM REPAIR (EVAR)Bilateral Groin Cutdown, left femoral endaterectomy with bovine patch angioplasty.;  Surgeon: Magda Debby SAILOR, MD;  Location: [facility] OR;  Service: Vascular;  Laterality: Bilateral;   ABDOMINAL HYSTERECTOMY     ANTERIOR CERVICAL DECOMP/DISCECTOMY FUSION N/A 10/03/2016   Procedure: ANTERIOR CERVICAL DECOMPRESSION/DISCECTOMY FUSION CERVICAL THREE- CERVICAL FOUR;  Surgeon: Gillie Duncans, MD;  Location: [facility] OR;  Service:  Neurosurgery;  Laterality: N/A;  Right side approach   BOTOX  INJECTION  08/29/2015   Procedure: BOTOX  INJECTION;  Surgeon: Jerrell Sol, MD;  Location: [facility] ENDOSCOPY;  Service: Endoscopy;;   CHOLECYSTECTOMY     COLONOSCOPY  12/03/2011   Procedure: COLONOSCOPY;  Surgeon: Jerrell KYM Sol, MD;  Location: [facility] ENDOSCOPY;  Service: Endoscopy;  Laterality: N/A;   COLONOSCOPY N/A 11/27/2021   Procedure: COLONOSCOPY;  Surgeon: Federico Rosario BROCKS, MD;  Location: [facility];  Service: Gastroenterology;  Laterality: N/A;   ENTEROSCOPY N/A 07/07/2021   Procedure: ENTEROSCOPY;  Surgeon: Rosalie Kitchens, MD;  Location: [facility] ENDOSCOPY;  Service: Endoscopy;  Laterality: N/A;   ENTEROSCOPY N/A 08/06/2021   Procedure: ENTEROSCOPY;  Surgeon: Saintclair Jasper, MD;  Location: [facility] ENDOSCOPY;  Service: Gastroenterology;  Laterality: N/A;   ENTEROSCOPY N/A 11/27/2021   Procedure: SMALL  BOWEL ENTEROSCOPY;  Surgeon: Federico Rosario BROCKS, MD;  Location: [facility] ENDOSCOPY;  Service: Gastroenterology;  Laterality: N/A;   ENTEROSCOPY N/A 11/07/2022   Procedure: ENTEROSCOPY;  Surgeon: Charlanne Groom, MD;  Location: [facility] ENDOSCOPY;  Service: Gastroenterology;  Laterality: N/A;   ENTEROSCOPY N/A 04/03/2023   Procedure: ENTEROSCOPY;  Surgeon: Avram Lupita BRAVO, MD;  Location: [facility] ENDOSCOPY;  Service: Gastroenterology;  Laterality: N/A;   ESOPHAGEAL MANOMETRY N/A 07/13/2017   Procedure: ESOPHAGEAL MANOMETRY (EM);  Surgeon: Sol Jerrell, MD;  Location: [facility]OSCOPY;  Service: Endoscopy;  Laterality: N/A;   ESOPHAGOGASTRODUODENOSCOPY  12/03/2011   Procedure: ESOPHAGOGASTRODUODENOSCOPY (EGD);  Surgeon: Jerrell KYM Sol, MD;  Location: [facility];  Service: Endoscopy;  Laterality: N/A;   ESOPHAGOGASTRODUODENOSCOPY (EGD) WITH PROPOFOL  N/A 08/29/2015   Procedure: ESOPHAGOGASTRODUODENOSCOPY (EGD) WITH PROPOFOL ;  Surgeon: Jerrell Sol, MD;  Location: [facility] ENDOSCOPY;  Service: Endoscopy;  Laterality: N/A;   ESOPHAGOGASTRODUODENOSCOPY (EGD) WITH PROPOFOL  N/A  05/17/2019   Procedure: ESOPHAGOGASTRODUODENOSCOPY (EGD) WITH PROPOFOL ;  Surgeon: Sol Jerrell, MD;  Location: [facility] ENDOS[facility];  Service: Endoscopy;  Laterality: N/A;   ESOPHAGOGASTRODUODENOSCOPY (EGD) WITH PROPOFOL  N/A 04/24/2021   Procedure: ESOPHAGOGASTRODUODENOSCOPY (EGD) WITH PROPOFOL ;  Surgeon: Elicia Claw, MD;  Location: [facility] ENDOSCOPY;  Service: Gastroenterology;  Laterality: N/A;   GIVENS CAPSULE STUDY N/A 08/05/2018   Procedure: GIVENS CAPSULE STUDY;  Surgeon: Sol Jerrell, MD;  Location: [facility] ENDOSCOPY;  Service: Endoscopy;  Laterality: N/A;   GIVENS CAPSULE STUDY N/A 07/06/2021   Procedure: GIVENS CAPSULE STUDY;  Surgeon: Rosalie Kitchens, MD;  Location: [facility] ENDOSCOPY;  Service: Endoscopy;  Laterality: N/A;   HOT HEMOSTASIS  12/03/2011   Procedure: HOT HEMOSTASIS (ARGON PLASMA COAGULATION/BICAP);  Surgeon: Jerrell KYM Sol, MD;  Location: [facility];  Service: Endoscopy;  Laterality: N/A;   HOT HEMOSTASIS N/A 07/07/2021   Procedure: HOT HEMOSTASIS (ARGON PLASMA COAGULATION/BICAP);  Surgeon: Rosalie Kitchens, MD;  Location: [facility];  Service: Endoscopy;  Laterality: N/A;   HOT HEMOSTASIS N/A 08/06/2021   Procedure: HOT HEMOSTASIS (ARGON PLASMA COAGULATION/BICAP);  Surgeon: Saintclair Jasper, MD;  Location: [facility];  Service: Gastroenterology;  Laterality: N/A;  HOT HEMOSTASIS  11/27/2021   Procedure: HOT HEMOSTASIS (ARGON PLASMA COAGULATION/BICAP);  Surgeon: Federico Rosario BROCKS, MD;  Location: [facility];  Service: Gastroenterology;;  EGD and COLON   HOT HEMOSTASIS N/A 11/07/2022   Procedure: HOT HEMOSTASIS (ARGON PLASMA COAGULATION/BICAP);  Surgeon: Charlanne Groom, MD;  Location: [facility];  Service: Gastroenterology;  Laterality: N/A;   HOT HEMOSTASIS N/A 04/03/2023   Procedure: HOT HEMOSTASIS (ARGON PLASMA COAGULATION/BICAP);  Surgeon: Avram Lupita BRAVO, MD;  Location: [facility];  Service: Gastroenterology;  Laterality: N/A;   NECK SURGERY     PERIPHERAL VASCULAR INTERVENTION  03/29/2021    Procedure: PERIPHERAL VASCULAR INTERVENTION;  Surgeon: Magda Debby SAILOR, MD;  Location: [facility] INV[facility]IVE CV [facility];  Service: Cardiovascular;;  Lt SFA Brachial Approach   POLYPECTOMY  11/27/2021   Procedure: POLYPECTOMY;  Surgeon: Federico Rosario BROCKS, MD;  Location: [facility];  Service: Gastroenterology;;   ROTATOR CUFF REPAIR     right   ROTATOR CUFF REPAIR Right 2014   THROMBECTOMY FEMORAL ARTERY Left 01/31/2021   Procedure: THROMBECTOMY POPLITEAL  ARTERY;  Surgeon: Magda Debby SAILOR, MD;  Location: [facility] OR;  Service: Vascular;  Laterality: Left;   TONSILLECTOMY      Prior to Admission medications   Medication Sig Start Date End Date Taking? Authorizing Provider  acetaminophen  (TYLENOL ) 500 MG tablet Take 500-1,000 mg by mouth every 6 (six) hours as needed for mild pain or headache.   Yes [provider]  aspirin  EC 81 MG tablet Take 81 mg by mouth daily. Swallow whole.   Yes [provider]  atorvastatin  (LIPITOR) 40 MG tablet Take 40 mg by mouth at bedtime.   Yes [provider]  Biotin 2500 MCG CHEW Chew 5,000 mcg by mouth daily in the afternoon.   Yes [provider]  Cinnamon 500 MG TABS Take 1,000 mg by mouth every evening.   Yes [provider]  donepezil  (ARICEPT ) 5 MG tablet Take 5 mg by mouth at bedtime.   Yes [provider]  ferrous gluconate  (FERGON) 324 MG tablet Take 324 mg by mouth daily with breakfast.   Yes [provider]  folic acid  (FOLVITE ) 800 MCG tablet Take 800 mcg by mouth daily.   Yes [provider]  levalbuterol (XOPENEX) 0.63 MG/3ML nebulizer solution Take 0.63 mg by nebulization every 6 (six) hours as needed for wheezing or shortness of breath. 10/13/23  Yes [provider]  levothyroxine  (SYNTHROID ) 125 MCG tablet Take 125 mcg by mouth See admin instructions. Take 125 mcg by mouth before breakfast on Mon/Tues/Wed/Thurs/Fri/Sat (take nothing on Sundays)   Yes [provider]  LINZESS   145 MCG CAPS capsule TAKE 1 CAPSULE BY MOUTH DAILY BEFORE BREAKFAST. 08/28/23  Yes Federico Rosario BROCKS, MD  montelukast  (SINGULAIR ) 10 MG tablet Take 10 mg by mouth daily in the afternoon.   Yes [provider]  Omega-3 Fatty Acids (FISH OIL BURP-LESS) 1200 MG CAPS Take 1,200 mg by mouth daily in the afternoon.   Yes [provider]  pantoprazole  (PROTONIX ) 40 MG tablet Take 1 tablet (40 mg total) by mouth daily before breakfast. 07/08/22  Yes Dorsey, Ying C, MD  spironolactone (ALDACTONE) 25 MG tablet Take 25 mg by mouth daily. 11/01/23  Yes [provider]  NAPOLEON INHUB 250-50 MCG/ACT AEPB Inhale 1 puff into the lungs in the morning and at bedtime.   Yes [provider]  albuterol  (VENTOLIN  HFA) 108 (90 Base) MCG/ACT inhaler Inhale 1-2 puffs into the lungs every 6 (six) hours  as needed for wheezing or shortness of breath.    [provider]  ascorbic acid (VITAMIN C) 500 MG tablet Take 500 mg by mouth daily.    [provider]  cyanocobalamin  (VITAMIN B12) 1000 MCG tablet Take 1,000 mcg by mouth daily.    [provider]  nitroGLYCERIN  (NITROSTAT ) 0.4 MG SL tablet Place 0.4 mg under the tongue every 5 (five) minutes as needed for chest pain.    [provider]    Current Facility-Administered Medications  Medication Dose Route Frequency Provider Last Rate Last Admin   0.9 %  sodium chloride  infusion (Manually program via Guardrails IV Fluids)   Intravenous Once Nivia Colon, PA-C       acetaminophen  (TYLENOL ) tablet 650 mg  650 mg Oral Q6H PRN Smith, Rondell A, MD       Or   acetaminophen  (TYLENOL ) suppository 650 mg  650 mg Rectal Q6H PRN Claudene Reeves A, MD       albuterol  (PROVENTIL ) (2.5 MG/3ML) 0.083% nebulizer solution 2.5 mg  2.5 mg Nebulization Q2H PRN Smith, Rondell A, MD       [START ON 12/05/2023] atorvastatin  (LIPITOR) tablet 40 mg  40 mg Oral QHS Smith, Rondell A, MD       calcium  gluconate 2 g/ 100 mL sodium chloride  IVPB   2 g Intravenous Once Smith, Rondell A, MD       donepezil  (ARICEPT ) tablet 5 mg  5 mg Oral QHS Smith, Rondell A, MD       [START ON 12/05/2023] fluticasone  furoate-vilanterol (BREO ELLIPTA ) 200-25 MCG/ACT 1 puff  1 puff Inhalation Daily Claudene Reeves LABOR, MD       [START ON 12/05/2023] levothyroxine  (SYNTHROID ) tablet 125 mcg  125 mcg Oral Once per day on Monday Tuesday Wednesday Thursday Friday Saturday Claudene Reeves LABOR, MD       montelukast  (SINGULAIR ) tablet 10 mg  10 mg Oral Q1500 Claudene Reeves LABOR, MD       [START ON 12/05/2023] pantoprazole  (PROTONIX ) EC tablet 40 mg  40 mg Oral QAC breakfast Claudene Reeves LABOR, MD       Current Outpatient Medications  Medication Sig Dispense Refill   acetaminophen  (TYLENOL ) 500 MG tablet Take 500-1,000 mg by mouth every 6 (six) hours as needed for mild pain or headache.     aspirin  EC 81 MG tablet Take 81 mg by mouth daily. Swallow whole.     atorvastatin  (LIPITOR) 40 MG tablet Take 40 mg by mouth at bedtime.     Biotin 2500 MCG CHEW Chew 5,000 mcg by mouth daily in the afternoon.     Cinnamon 500 MG TABS Take 1,000 mg by mouth every evening.     donepezil  (ARICEPT ) 5 MG tablet Take 5 mg by mouth at bedtime.     ferrous gluconate  (FERGON) 324 MG tablet Take 324 mg by mouth daily with breakfast.     folic acid  (FOLVITE ) 800 MCG tablet Take 800 mcg by mouth daily.     levalbuterol (XOPENEX) 0.63 MG/3ML nebulizer solution Take 0.63 mg by nebulization every 6 (six) hours as needed for wheezing or shortness of breath.     levothyroxine  (SYNTHROID ) 125 MCG tablet Take 125 mcg by mouth See admin instructions. Take 125 mcg by mouth before breakfast on Mon/Tues/Wed/Thurs/Fri/Sat (take nothing on Sundays)     LINZESS  145 MCG CAPS capsule TAKE 1 CAPSULE BY MOUTH DAILY BEFORE BREAKFAST. 30 capsule 6   montelukast  (SINGULAIR ) 10 MG tablet Take 10 mg by mouth  daily in the afternoon.     Omega-3 Fatty Acids (FISH OIL BURP-LESS) 1200 MG CAPS Take 1,200 mg by mouth daily in the  afternoon.     pantoprazole  (PROTONIX ) 40 MG tablet Take 1 tablet (40 mg total) by mouth daily before breakfast. 90 tablet 1   spironolactone (ALDACTONE) 25 MG tablet Take 25 mg by mouth daily.     WIXELA INHUB 250-50 MCG/ACT AEPB Inhale 1 puff into the lungs in the morning and at bedtime.     albuterol  (VENTOLIN  HFA) 108 (90 Base) MCG/ACT inhaler Inhale 1-2 puffs into the lungs every 6 (six) hours as needed for wheezing or shortness of breath.     ascorbic acid (VITAMIN C) 500 MG tablet Take 500 mg by mouth daily.     cyanocobalamin  (VITAMIN B12) 1000 MCG tablet Take 1,000 mcg by mouth daily.     nitroGLYCERIN  (NITROSTAT ) 0.4 MG SL tablet Place 0.4 mg under the tongue every 5 (five) minutes as needed for chest pain.      Allergies as of 12/04/2023 - Review Complete 12/04/2023  Allergen Reaction Noted   Oxycodone Shortness Of Breath 03/12/2012    Family History  Problem Relation Age of Onset   Diabetes Mother    Heart attack Brother    Cancer Maternal Aunt     Social History   Socioeconomic History   Marital status: Divorced    Spouse name: Not on file   Number of children: 2   Years of education: Not on file   Highest education level: Not on file  Occupational History   Occupation: group leader environmental services    Employer: [employer]    Comment: [employer]  Tobacco Use   Smoking status: Former    Current packs/day: 0.00    Types: Cigarettes    Quit date: 06/02/2000    Years since quitting: 23.5   Smokeless tobacco: Never  Vaping Use   Vaping status: Never Used  Substance and Sexual Activity   Alcohol use: No   Drug use: No   Sexual activity: Not on file  Other Topics Concern   Not on file  Social History Narrative   ** Merged History Encounter **       Social Drivers of Health   Financial Resource Strain: Not on file  Food Insecurity: No Food Insecurity (04/05/2023)   Hunger Vital Sign    Worried About Running Out of Food in the Last  Year: Never true    Ran Out of Food in the Last Year: Never true  Transportation Needs: No Transportation Needs (04/05/2023)   PRAPARE - Administrator, Civil Service (Medical): No    Lack of Transportation (Non-Medical): No  Physical Activity: Not on file  Stress: Not on file  Social Connections: Not on file  Intimate Partner Violence: Not At Risk (04/05/2023)   Humiliation, Afraid, Rape, and Kick questionnaire    Fear of Current or Ex-Partner: No    Emotionally Abused: No    Physically Abused: No    Sexually Abused: No    Review of Systems: Pertinent positive and negative review of systems were noted in the above HPI section.  All other review of systems was otherwise negative.   Physical Exam: Vital signs in last 24 hours: Temp:  [97.8 F (36.6 C)-98.3 F (36.8 C)] 97.8 F (36.6 C) (07/04 1335) Pulse Rate:  [76-80] 76 (07/04 1335) Resp:  [13-20] 13 (07/04 1335) BP: (101-110)/(54-73) 101/73 (07/04 1335)  SpO2:  [99 %-100 %] 100 % (07/04 1335)   General:   Alert,  Well-developed, well-nourished, elderly African-American female pleasant and cooperative in NAD family at bedside Head:  Normocephalic and atraumatic. Eyes:  Sclera clear, no icterus.   Conjunctiva pale Ears:  Normal auditory acuity. Nose:  No deformity, discharge,  or lesions. Mouth:  No deformity or lesions.   Neck:  Supple; no masses or thyromegaly. Lungs:  Clear throughout to auscultation.   No wheezes, crackles, or rhonchi.  Heart:  Regular rate and rhythm; no murmurs, clicks, rubs,  or gallops. Abdomen:  Soft,nontender, BS active,nonpalp mass or hsm.  Left abdominal scar, right upper quadrant scar Rectal: Not done Msk:  Symmetrical without gross deformities. . Pulses:  Normal pulses noted. Extremities:  Without clubbing or edema. Neurologic:  Alert and  oriented x4;  grossly normal neurologically. Skin:  Intact without significant lesions or rashes.. Psych:  Alert and cooperative. Normal mood  and affect.  Intake/Output from previous day: No intake/output data recorded. Intake/Output this shift: No intake/output data recorded.  Lab Results: Recent Labs    12/03/23 1608 12/04/23 1117  WBC  --  4.5  HGB 7.5* 7.1*  HCT 23.7* 23.0*  PLT  --  140*   BMET Recent Labs    12/04/23 1117  NA 139  K 4.2  CL 108  CO2 24  GLUCOSE 103*  BUN 22  CREATININE 1.42*  CALCIUM  8.3*   LFT Recent Labs    12/04/23 1117  PROT 6.4*  ALBUMIN  3.4*  AST 18  ALT 9  ALKPHOS 89  BILITOT 0.7   PT/INR No results for input(s): LABPROT, INR in the last 72 hours. Hepatitis Panel No results for input(s): HEPBSAG, HCVAB, HEPAIGM, HEPBIGM in the last 72 hours.   IMPRESSION:  #54 73 year old African-American female with history of recurrent anemia, chronic iron  deficiency, requiring intermittent IV iron .  And history of small bowel AVMs. Patient had an overt episode of melena 3 days ago at home, was seen in our office yesterday and found to have hemoglobin drop from 9.7 on 11/16/2023 down to 7.5 yesterday.  She was advised to come to the ER for admission. Generally she has not had any further bowel movement since that time, however hemoglobin down to 7.1 today. He has been symptomatic with unsteady gait and fatigue  Suspect recurrent acute/subacute GI blood loss secondary to recurrent small bowel AVMs.  Last enteroscopy November 2024 with 1 duodenal AVM treated with APC and prior to that June 2024 had APC of 6 AVMs in the duodenum/jejunum.  #2 history of achalasia status post p.o. EM 2019 #3 asthma 4.  Diabetes mellitus 5.  Hypertension 6.  Remote history of peptic ulcer disease 7.  Diverticulosis 8.  Sickle cell trait 9.  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 past  midnight Patient is being transfused 2 units of packed RBCs today, will continue to trend hemoglobin thereafter and transfuse to keep her hemoglobin above 7.5 Patient will tentatively be scheduled for small bowel endoscopy with Dr. Federico for tomorrow 12/05/2023.  Due to large number of procedures scheduled for tomorrow it is possible this will not be able to be done and will be moved to Sunday.  I discussed the enteroscopy and timing with the patient in detail including indications risk and benefits and she is agreeable  to proceed. GI will follow with you  Len Azeez PA-C 12/04/2023, 2:56 PM

## 2023-12-04 NOTE — Progress Notes (Signed)
 I agree with the assessment and plan as outlined by Lacey Vega.

## 2023-12-04 NOTE — ED Notes (Signed)
 Paper consent for blood products signed by patient and filed in medical records box with Licensed conveyancer.

## 2023-12-04 NOTE — ED Triage Notes (Signed)
 Pt sent by doctor for further evaluation of anemia and dark tarry stools. HGB 7.5 yesterday in the office. Pt did have a near syncopal episode a few days ago. Pt denies abd pain or n/v, dizziness at this time

## 2023-12-04 NOTE — H&P (Addendum)
 History and Physical    Patient: Lacey Vega FMW:[id number] DOB: 08/20/1950 DOA: 12/04/2023 DOS: the patient was seen and examined on 12/04/2023 PCP: Addie Camellia CROME, MD  Patient coming from: Home  Chief Complaint:  Chief Complaint  Patient presents with   GI Bleeding   HPI: Larene Ascencio is a 73 y.o. 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 After using the restroom, she felt hot, sweaty, out of breath, and too weak to make it back to bed, needing assistance to return. She has not experienced any nausea or vomiting but describes a 'weird' sensation across her chest, which she associates with eating spicy food given by her sister. She describes feeling more tired and weaker than usual here lately. She was brought to the hospital today following a follow-up appointment with her gastroenterologist yesterday her blood work showed her hemoglobin had dropped down to 7.5 g/dL from 9.7 when checked on 6/16.  She was on Plavix , but is currently only taking a baby aspirin .    In the emergency department patient was noted to be afebrile with blood pressures 101/73-118/50, and all other vital signs maintained.  Labs significant for hemoglobin 7.1, platelets 140, BUN 22, and creatinine 1.42.  Stool guaiacs were noted to be positive.  [facility] had been consulted to evaluate.  Patient was typed and screened and ordered to be transfused 1 unit packed red blood cells.   Review of Systems: As mentioned in the history of present illness. All other systems reviewed and are negative. Past Medical History:   Diagnosis Date   Anemia    Anxiety    Asthma    Asthma    Bronchitis    hx   Colon polyps    hyperplastic   Diabetes (HCC)    mild, history of   DJD (degenerative joint disease), cervical    Dysphagia    history of   Dyspnea    Gallstones    GERD (gastroesophageal reflux disease)    H/O chest pain    secondary to anemia   Headache    History of arteriovenous malformation (AVM)    History of blood transfusion 10/2021   History of hiatal hernia    History of tobacco use    Hyperlipemia    Hypertension    Hypothyroidism    Iron  deficiency anemia    Sickle cell anemia (HCC)    patient is not aware of this   Thyroid  disease    TIA (transient ischemic attack)    was told that she possible had one   Wears dentures    Wears glasses    Past Surgical History:  Procedure Laterality Date   ABDOMINAL AORTIC ENDOVASCULAR STENT GRAFT Bilateral 01/31/2021   Procedure: ENDOVASCULAR ANEURYSM REPAIR (EVAR)Bilateral Groin Cutdown, left femoral endaterectomy with bovine patch angioplasty.;  Surgeon: Magda Debby SAILOR, MD;  Location: [facility] OR;  Service: Vascular;  Laterality: Bilateral;   ABDOMINAL HYSTERECTOMY     ANTERIOR CERVICAL DECOMP/DISCECTOMY FUSION N/A 10/03/2016   Procedure: ANTERIOR CERVICAL DECOMPRESSION/DISCECTOMY FUSION CERVICAL THREE- CERVICAL FOUR;  Surgeon: Gillie Duncans, MD;  Location: [facility] OR;  Service: Neurosurgery;  Laterality: N/A;  Right side approach   BOTOX  INJECTION  08/29/2015   Procedure: BOTOX  INJECTION;  Surgeon: Jerrell Sol, MD;  Location: [facility] ENDOSCOPY;  Service: Endoscopy;;   CHOLECYSTECTOMY     COLONOSCOPY  12/03/2011   Procedure: COLONOSCOPY;  Surgeon: Jerrell KYM Sol, MD;  Location: [facility] ENDOSCOPY;  Service: Endoscopy;  Laterality: N/A;   COLONOSCOPY N/A 11/27/2021   Procedure: COLONOSCOPY;  Surgeon: Federico Rosario BROCKS, MD;  Location: [facility];  Service: Gastroenterology;  Laterality: N/A;   ENTEROSCOPY N/A 07/07/2021   Procedure: ENTEROSCOPY;  Surgeon:  Rosalie Kitchens, MD;  Location: [facility] ENDOSCOPY;  Service: Endoscopy;  Laterality: N/A;   ENTEROSCOPY N/A 08/06/2021   Procedure: ENTEROSCOPY;  Surgeon: Saintclair Jasper, MD;  Location: [facility] ENDOSCOPY;  Service: Gastroenterology;  Laterality: N/A;   ENTEROSCOPY N/A 11/27/2021   Procedure: SMALL  BOWEL ENTEROSCOPY;  Surgeon: Federico Rosario BROCKS, MD;  Location: [facility] ENDOSCOPY;  Service: Gastroenterology;  Laterality: N/A;   ENTEROSCOPY N/A 11/07/2022   Procedure: ENTEROSCOPY;  Surgeon: Charlanne Groom, MD;  Location: [facility] ENDOSCOPY;  Service: Gastroenterology;  Laterality: N/A;   ENTEROSCOPY N/A 04/03/2023   Procedure: ENTEROSCOPY;  Surgeon: Avram Lupita BRAVO, MD;  Location: [facility];  Service: Gastroenterology;  Laterality: N/A;   ESOPHAGEAL MANOMETRY N/A 07/13/2017   Procedure: ESOPHAGEAL MANOMETRY (EM);  Surgeon: Sol Jerrell, MD;  Location: [facility]OS[facility];  Service: Endoscopy;  Laterality: N/A;   ESOPHAGOGASTRODUODENOSCOPY  12/03/2011   Procedure: ESOPHAGOGASTRODUODENOSCOPY (EGD);  Surgeon: Jerrell KYM Sol, MD;  Location: [facility];  Service: Endoscopy;  Laterality: N/A;   ESOPHAGOGASTRODUODENOSCOPY (EGD) WITH PROPOFOL  N/A 08/29/2015   Procedure: ESOPHAGOGASTRODUODENOSCOPY (EGD) WITH PROPOFOL ;  Surgeon: Jerrell Sol, MD;  Location: [facility] ENDOSCOPY;  Service: Endoscopy;  Laterality: N/A;   ESOPHAGOGASTRODUODENOSCOPY (EGD) WITH PROPOFOL  N/A 05/17/2019   Procedure: ESOPHAGOGASTRODUODENOSCOPY (EGD) WITH PROPOFOL ;  Surgeon: Sol Jerrell, MD;  Location: [facility] ENDOS[facility];  Service: Endoscopy;  Laterality: N/A;   ESOPHAGOGASTRODUODENOSCOPY (EGD) WITH PROPOFOL  N/A 04/24/2021   Procedure: ESOPHAGOGASTRODUODENOSCOPY (EGD) WITH PROPOFOL ;  Surgeon: Elicia Claw, MD;  Location: [facility] ENDOSCOPY;  Service: Gastroenterology;  Laterality: N/A;   GIVENS CAPSULE STUDY N/A 08/05/2018   Procedure: GIVENS CAPSULE STUDY;  Surgeon: Sol Jerrell, MD;  Location: [facility]COPY;  Service: Endoscopy;  Laterality: N/A;   GIVENS CAPSULE  STUDY N/A 07/06/2021   Procedure: GIVENS CAPSULE STUDY;  Surgeon: Rosalie Kitchens, MD;  Location: [facility] ENDOSCOPY;  Service: Endoscopy;  Laterality: N/A;   HOT HEMOSTASIS  12/03/2011   Procedure: HOT HEMOSTASIS (ARGON PLASMA COAGULATION/BICAP);  Surgeon: Jerrell KYM Sol, MD;  Location: [facility];  Service: Endoscopy;  Laterality: N/A;   HOT HEMOSTASIS N/A 07/07/2021   Procedure: HOT HEMOSTASIS (ARGON PLASMA COAGULATION/BICAP);  Surgeon: Rosalie Kitchens, MD;  Location: [facility];  Service: Endoscopy;  Laterality: N/A;   HOT HEMOSTASIS N/A 08/06/2021   Procedure: HOT HEMOSTASIS (ARGON PLASMA COAGULATION/BICAP);  Surgeon: Saintclair Jasper, MD;  Location: [facility];  Service: Gastroenterology;  Laterality: N/A;   HOT HEMOSTASIS  11/27/2021   Procedure: HOT HEMOSTASIS (ARGON PLASMA COAGULATION/BICAP);  Surgeon: Federico Rosario BROCKS, MD;  Location: [facility];  Service: Gastroenterology;;  EGD and COLON   HOT HEMOSTASIS N/A 11/07/2022   Procedure: HOT HEMOSTASIS (ARGON PLASMA COAGULATION/BICAP);  Surgeon: Charlanne Groom, MD;  Location: [facility];  Service: Gastroenterology;  Laterality: N/A;   HOT HEMOSTASIS N/A 04/03/2023   Procedure: HOT HEMOSTASIS (ARGON PLASMA COAGULATION/BICAP);  Surgeon: Avram Lupita BRAVO, MD;  Location: [facility];  Service: Gastroenterology;  Laterality: N/A;   NECK SURGERY     PERIPHERAL VASCULAR INTERVENTION  03/29/2021   Procedure: PERIPHERAL VASCULAR INTERVENTION;  Surgeon: Magda Debby SAILOR, MD;  Location: [facility] INVASIVE CV LAB;  Service: Cardiovascular;;  Lt SFA Brachial Approach   POLYPECTOMY  11/27/2021   Procedure: POLYPECTOMY;  Surgeon: Federico Rosario BROCKS, MD;  Location: [facility];  Service: Gastroenterology;;   ROTATOR CUFF REPAIR     right   ROTATOR CUFF REPAIR Right 2014   THROMBECTOMY FEMORAL ARTERY Left 01/31/2021   Procedure: THROMBECTOMY POPLITEAL  ARTERY;  Surgeon: Magda Debby SAILOR, MD;  Location: [facility] OR;  Service: Vascular;  Laterality: Left;   TONSILLECTOMY     Social History:   reports that she quit smoking about 23 years ago. Her smoking use included cigarettes. She has never used smokeless tobacco. She reports that she does not drink alcohol and does not use drugs.  Allergies  Allergen Reactions   Oxycodone Shortness Of Breath    Family History  Problem Relation Age of Onset   Diabetes Mother    Heart attack Brother    Cancer Maternal Aunt     Prior to Admission medications   Medication Sig Start Date End Date Taking? Authorizing Provider  acetaminophen  (TYLENOL ) 500 MG tablet Take 500-1,000 mg by mouth every 6 (six) hours as needed for mild pain or headache.    [provider]  albuterol  (VENTOLIN  HFA) 108 (90 Base) MCG/ACT inhaler Inhale 1-2 puffs into the lungs every 6 (six) hours as needed for wheezing or shortness of breath.    [provider]  amoxicillin -clavulanate (AUGMENTIN ) 500-125 MG tablet Take 1 tablet by mouth 2 (two) times daily. 04/05/23   Will Almarie MATSU, MD  ascorbic acid (VITAMIN C) 500 MG tablet Take 500 mg by mouth daily.    [provider]  aspirin  EC 81 MG tablet Take 1 tablet (81 mg total) by mouth daily. **Start on 11/25** Patient taking differently: Take 81 mg by mouth in the morning. 04/26/21   Rojelio Nest, DO  aspirin  EC 81 MG tablet Take 81 mg by mouth daily. Swallow whole.    [provider]  atorvastatin  (LIPITOR) 40 MG tablet Take 40 mg by mouth at bedtime.    [provider]  Biotin 2500 MCG CHEW Chew 5,000 mcg by mouth daily in the afternoon.    [provider]  Cinnamon 500 MG TABS Take 1,000 mg by mouth every evening.    [provider]  clopidogrel  (PLAVIX ) 75 MG tablet TAKE 1 TABLET BY MOUTH EVERY DAY Patient not taking: Reported on 12/03/2023 05/04/23   Magda Debby SAILOR, MD  cyanocobalamin  (VITAMIN B12) 1000 MCG tablet Take 1,000 mcg by mouth daily.    [provider]  donepezil  (ARICEPT ) 5 MG tablet Take 5 mg by mouth at bedtime.    [provider]  ferrous gluconate  (FERGON) 324 MG tablet Take 324 mg by mouth daily with breakfast.    [provider]  folic acid  (FOLVITE ) 800 MCG tablet Take 800 mcg by mouth daily.    [provider]  levothyroxine  (SYNTHROID ) 125 MCG tablet Take 125 mcg by mouth See admin instructions. Take 125 mcg by mouth before breakfast on Mon/Tues/Wed/Thurs/Fri/Sat (take nothing on Sundays)    [provider]  LINZESS  145 MCG CAPS capsule TAKE 1 CAPSULE BY MOUTH DAILY BEFORE BREAKFAST. 08/28/23   Federico Rosario BROCKS, MD  montelukast  (SINGULAIR ) 10 MG tablet Take 10 mg by mouth daily in the afternoon.    [provider]  nitroGLYCERIN  (NITROSTAT ) 0.4 MG SL tablet Place  0.4 mg under the tongue every 5 (five) minutes as needed for chest pain.    [provider]  Omega-3 Fatty Acids (FISH OIL BURP-LESS) 1200 MG CAPS Take 1,200 mg by mouth daily in the afternoon.    [provider]  pantoprazole  (PROTONIX ) 40 MG tablet Take 1 tablet (40 mg total) by mouth daily before breakfast. 07/08/22   Federico Rosario BROCKS, MD  NAPOLEON INHUB 250-50 MCG/ACT AEPB Inhale 1 puff into the lungs in the morning and at bedtime.    [provider]    Physical Exam: Vitals:   12/04/23 1016  BP: (!) 110/54  Pulse: 80  Resp: 20  Temp: 98.3 F (36.8 C)  SpO2: 99%   Constitutional: Elderly female currently no acute distress Eyes: PERRL, lids and conjunctivae normal ENMT: Mucous membranes are moist.  Normal dentition.  Neck: normal, supple, no masses, no thyromegaly Respiratory: clear to auscultation bilaterally, no wheezing, no crackles. Normal respiratory effort. No accessory muscle use.  Cardiovascular: Regular rate and rhythm, no murmurs / rubs / gallops. No extremity edema. 2+ pedal pulses.   Abdomen: no tenderness, no masses palpated. Bowel sounds positive.  Musculoskeletal: no clubbing / cyanosis. No joint deformity upper and lower extremities. Good ROM, no contractures.  Normal muscle tone.  Skin: no rashes, lesions, ulcers. No induration Neurologic: CN 2-12 grossly intact.  Strength 5/5 in all 4.  Psychiatric: Normal judgment and insight. Alert and oriented x 3. Normal mood.   Data Reviewed:  Reviewed labs, imaging, and pertinent records as documented.  Assessment and Plan:  Acute on chronic blood loss anemia due to suspected upper GI bleed History of angiodysplasias Patient presents with reports of increased fatigue and weakness.  Found to have acute drop in hemoglobin down to 7.1 when checked today, but had been 9.7 when checked on 6/16.SABRA  Patient with history of angiodysplasia.  Last enteroscopy from 04/2023 noted single nonbleeding angiodysplastic lesion in the duodenum which was treated with APC.  Patient was typed and screened and ordered 1 unit of packed red blood cells.  [facility] have been consulted we will follow-up for any further recommendations. - Admit to a telemetry bed - Clear liquid diet and n.p.o. after midnight - Hold aspirin  - Continue with transfusion of a total of 2 unit of packed red blood cells as most recent hemoglobin 6.4 - Recheck H&H post transfusion.  Chest pain  Patient reports that she has had intermittent pains across her chest.  Initially attributed symptoms to eating spicy food. - Check EKG and high-sensitivity troponin  Hypocalcemia Acute. Calcium  was 8.3.  Possibly associated with intermittent pain/ muscle spasms. - Replace calcium  levels  Thrombocytopenia Acute.  Platelet count noted to be mildly low at 140. - Continue to monitor  Chronic kidney disease stage IIIb Creatinine noted to be 1.42 with BUN 22.  Baseline creatinine noted to be a range from 1.2-1.4. - Continue to monitor  PAD History of AAA Patient with prior history of endovascular repair by Dr. Vonzell in 2022.  Patient had previously been on dual antiplatelet therapy but was discontinued back in 03/2023 after having acute bleed. - Hold aspirin   and continue statin  Hyperlipidemia - Continue atorvastatin   Mild cognitive impairment - Continue donepezil   Hypothyroidism - Continue levothyroxine   Sickle cell trait  GERD - Continue Protonix  and increased to twice daily  Obesity BMI 31.62 kg/m   DVT prophylaxis: SCDs Advance Care Planning:   Code Status: Full Code   Consults: [facility]  Family Communication: Family updated at bedside  Severity of Illness: The appropriate patient status for this patient is INPATIENT. Inpatient status is judged to be reasonable and necessary in order to provide the required intensity of service to ensure the patient's safety. The patient's presenting symptoms, physical exam findings, and initial radiographic and laboratory data in the context of their chronic comorbidities is felt to place them at high risk for further clinical deterioration. Furthermore, it is not anticipated that the patient will be medically stable for discharge from the hospital within 2 midnights of admission.   * I certify that at the point of admission it is my clinical judgment that the patient will require inpatient hospital care spanning beyond 2 midnights from the point of admission due to high intensity of service, high risk for further deterioration and high frequency of surveillance required.*  Author: Maximino DELENA Sharps, MD 12/04/2023 1:33 PM  For on call review [email or web address].

## 2023-12-04 NOTE — ED Provider Notes (Signed)
 [facility] EMERGENCY DEPARTMENT AT [facility] Provider Note   CSN: [id number] Arrival date & time: 12/04/23  1012     Patient presents with: GI Bleeding   Lacey Vega is a 73 y.o. female.  {Add pertinent medical, surgical, social history, OB history to YEP:67052} The history is provided by the patient, a relative and medical records. No language interpreter was used.     73 year old female history of AAA, iron  deficiency anemia, diabetes, asthma, PAD, GI bleed, sent here by PCP with concerns of GI bleed.  4 days ago, patient was having some fatigue and was having some trouble getting out from commode after she had a bowel movement.  She noticed black tarry stool.  This is after she took her Linzess .  Her daughter brought her to be evaluated by her GI specialist yesterday and blood work was obtained.  Patient was notified today that her hemoglobin is low at 7.5 and patient should come to ER.  Currently patient is without any complaint except for some lower back pain that she recently had an MRI done a few days ago.  She denies any headache chest pain trouble breathing productive cough abdominal pain urinary symptoms or any abnormal bleeding elsewhere.  She is not currently taking Plavix  at.  She is taking baby aspirin  only.  Prior to Admission medications   Medication Sig Start Date End Date Taking? Authorizing Provider  acetaminophen  (TYLENOL ) 500 MG tablet Take 500-1,000 mg by mouth every 6 (six) hours as needed for mild pain or headache.    [provider]  albuterol  (VENTOLIN  HFA) 108 (90 Base) MCG/ACT inhaler Inhale 1-2 puffs into the lungs every 6 (six) hours as needed for wheezing or shortness of breath.    [provider]  amoxicillin -clavulanate (AUGMENTIN ) 500-125 MG tablet Take 1 tablet by mouth 2 (two) times daily. 04/05/23   Will Almarie MATSU, MD  ascorbic acid (VITAMIN C) 500 MG tablet Take 500 mg by mouth daily.    [provider]   aspirin  EC 81 MG tablet Take 1 tablet (81 mg total) by mouth daily. **Start on 11/25** Patient taking differently: Take 81 mg by mouth in the morning. 04/26/21   Rojelio Nest, DO  aspirin  EC 81 MG tablet Take 81 mg by mouth daily. Swallow whole.    [provider]  atorvastatin  (LIPITOR) 40 MG tablet Take 40 mg by mouth at bedtime.    [provider]  Biotin 2500 MCG CHEW Chew 5,000 mcg by mouth daily in the afternoon.    [provider]  Cinnamon 500 MG TABS Take 1,000 mg by mouth every evening.    [provider]  clopidogrel  (PLAVIX ) 75 MG tablet TAKE 1 TABLET BY MOUTH EVERY DAY Patient not taking: Reported on 12/03/2023 05/04/23   Magda Debby SAILOR, MD  cyanocobalamin  (VITAMIN B12) 1000 MCG tablet Take 1,000 mcg by mouth daily.    [provider]  donepezil  (ARICEPT ) 5 MG tablet Take 5 mg by mouth at bedtime.    [provider]  ferrous gluconate  (FERGON) 324 MG tablet Take 324 mg by mouth daily with breakfast.    [provider]  folic acid  (FOLVITE ) 800 MCG tablet Take 800 mcg by mouth daily.    [provider]  levothyroxine  (SYNTHROID ) 125 MCG tablet Take 125 mcg by mouth See admin instructions. Take 125 mcg by mouth before breakfast on Mon/Tues/Wed/Thurs/Fri/Sat (take nothing on Sundays)    [provider]  LINZESS  145 MCG  CAPS capsule TAKE 1 CAPSULE BY MOUTH DAILY BEFORE BREAKFAST. 08/28/23   Federico Rosario BROCKS, MD  montelukast  (SINGULAIR ) 10 MG tablet Take 10 mg by mouth daily in the afternoon.    [provider]  nitroGLYCERIN  (NITROSTAT ) 0.4 MG SL tablet Place 0.4 mg under the tongue every 5 (five) minutes as needed for chest pain.    [provider]  Omega-3 Fatty Acids (FISH OIL BURP-LESS) 1200 MG CAPS Take 1,200 mg by mouth daily in the afternoon.    [provider]  pantoprazole  (PROTONIX ) 40 MG tablet Take 1 tablet (40 mg total) by mouth daily before breakfast. 07/08/22   Federico Rosario BROCKS, MD  NAPOLEON INHUB 250-50 MCG/ACT AEPB Inhale 1 puff into the lungs in the morning and at bedtime.    [provider]    Allergies: Oxycodone    Review of Systems  All other systems reviewed and are negative.   Updated Vital Signs BP (!) 110/54 (BP Location: Right Arm)   Pulse 80   Temp 98.3 F (36.8 C)   Resp 20   SpO2 99%   Physical Exam Vitals and nursing note reviewed.  Constitutional:      General: She is not in acute distress.    Appearance: She is well-developed.  HENT:     Head: Atraumatic.  Eyes:     Conjunctiva/sclera: Conjunctivae normal.  Cardiovascular:     Rate and Rhythm: Normal rate and regular rhythm.     Pulses: Normal pulses.     Heart sounds: Normal heart sounds.  Pulmonary:     Effort: Pulmonary effort is normal.     Breath sounds: Normal breath sounds.  Abdominal:     Palpations: Abdomen is soft.     Tenderness: There is no abdominal tenderness.  Genitourinary:    Comments: Chaperone present during exam.  Normal color stool on glove normal rectal tone no obvious mass no stool impaction. Musculoskeletal:     Cervical back: Neck supple.  Skin:    Findings: No rash.  Neurological:     Mental Status: She is alert.     (all labs ordered are listed, but only abnormal results are displayed) Labs Reviewed  COMPREHENSIVE METABOLIC PANEL WITH GFR  CBC  POC OCCULT BLOOD, ED  TYPE AND SCREEN    EKG: None  Radiology: MR LUMBAR SPINE WO CONTRAST Result Date: 12/03/2023 MR LUMBAR SPINE WITHOUT IV CONTRAST COMPARISON: None available CLINICAL HISTORY: None available TECHNIQUE: SAG T2, SAG T1, SAG STIR, AX T2, AX T1 without IV contrast. FINDINGS: There is normal alignment of the lumbar spine. Mild multilevel disc desiccation and mild-to-moderate facet arthrosis. There is no vertebral body height loss, subluxation or marrow replacing process. The sacrum and SI joints are unremarkable so far as visualized. Conus and cauda equina are  unremarkable. T12-L1: There is no focal disc protrusion, foraminal or spinal stenosis. L1-2: There is no focal disc protrusion, foraminal or spinal stenosis. Mild facet arthrosis. L2-3: Mild broad-based bulge slightly effacing the ventral thecal sac. Moderate facet arthrosis bilaterally. There is caudal foraminal narrowing, left slightly greater than right. No impingement of the descending nerve roots or exiting nerves. L3-4: Mild disc desiccation moderate facet arthrosis. No significant foraminal or spinal stenosis. L4-5: Broad-based disc osteophyte moderate facet arthrosis. No significant foraminal or spinal stenosis. L5-S1: There is a transitional vertebral level on the left with fusion of the L5 vertebral body in the S1 vertebral body best seen on sagittal image 14 and 15. No significant  foraminal or spinal stenosis. Mild facet arthrosis is There is a large infrarenal abdominal aortic aneurysm measuring 5.4 cm in AP dimension, 5.5 cm in transverse dimension and 10 cm in length. There is an endoluminal stent graft in place. Size of the native aneurysm is relatively stable when compared with the prior CTA. IMPRESSION: Mild degenerative disc desiccation facet arthrosis. No significant foraminal or spinal stenosis. Mild multilevel facet arthrosis most notably on the right at L3-4. No acute abnormality. Stable infrarenal abdominal aortic aneurysm with endoluminal stent graft in place. Electronically signed by: Norleen Satchel MD 12/03/2023 01:31 PM EDT RP Workstation: [id number]    {Document cardiac monitor, telemetry assessment procedure when appropriate:32947} Procedures   Medications Ordered in the ED - No data to display    {Click here for ABCD2, HEART and other calculators REFRESH Note before signing:1}                              Medical Decision Making Amount and/or Complexity of Data Reviewed Labs: ordered.  Risk Prescription drug management.   BP (!) 110/54 (BP Location: Right Arm)   Pulse  80   Temp 98.3 F (36.8 C)   Resp 20   SpO2 99%   2:62 AM 73 year old female history of AAA, iron  deficiency anemia, diabetes, asthma, PAD, GI bleed, sent here by PCP with concerns of GI bleed.  4 days ago, patient was having some fatigue and was having some trouble getting out from commode after she had a bowel movement.  She noticed black tarry stool.  This is after she took her Linzess .  Her daughter brought her to be evaluated by her GI specialist yesterday and blood work was obtained.  Patient was notified today that her hemoglobin is low at 7.5 and patient should come to ER.  Currently patient is without any complaint except for some lower back pain that she recently had an MRI done a few days ago.  She denies any headache chest pain trouble breathing productive cough abdominal pain urinary symptoms or any abnormal bleeding elsewhere.  She is not currently taking Plavix  at.  She is taking baby aspirin  only.  On exam patient resting comfortably appears to be in no acute discomfort.  Heart with normal rate rhythm, lungs are clear abdomen soft nontender rectal exam without any overt bleeding or black tarry stool.  EMR review I independently viewed interpreted recent lumbar spine MRI that was done yesterday for lower back pain.  No acute findings were noted.  Patient has a stable infrarenal abdominal aortic aneurysm with an endoluminal stent graft in place.  -Labs ordered, independently viewed and interpreted by me.  Labs remarkable for hemoglobin of 7.1.  Will give blood transfusion.  Creatinine of 1.42 similar to baseline.  Fecal occult blood test is positive -The patient was maintained on a cardiac monitor.  I personally viewed and interpreted the cardiac monitored which showed an underlying rhythm of: Sinus rhythm -Imaging abdominal pelvis CT scan with angiogram considered but not performed as patient is likely benefit from endoscopy -This patient presents to the ED for concern of fatigue,  this involves an extensive number of treatment options, and is a complaint that carries with it a high risk of complications and morbidity.  The differential diagnosis includes anemia, electrolyte imbalance, infection, stroke, MI, hypovolemia, metabolic derangement -Co morbidities that complicate the patient evaluation includes iron  deficiency anemia, GI bleed -Treatment includes blood transfusion -Reevaluation of the patient  after these medicines showed that the patient improved -PCP office notes or outside notes reviewed -Discussion with specialist [place] [facility] team who agrees to be involved in patient care -Escalation to admission/observation considered: pt will be admitted   {Document critical care time when appropriate  Document review of labs and clinical decision tools ie [id number], etc  Document your independent review of radiology images and any outside records  Document your discussion with family members, caretakers and with consultants  Document social determinants of health affecting pt's care  Document your decision making why or why not admission, treatments were needed:32947:::1}   Final diagnoses:  None    ED Discharge Orders     None

## 2023-12-04 NOTE — ED Notes (Signed)
 Blood consent scanned into patients chart.

## 2023-12-04 NOTE — Consult Note (Signed)
 Consultation  Referring Provider: TRH/Smith Primary Care Physician:  Addie Camellia CROME, MD Primary Gastroenterologist:  Dr. Federico  Reason for Consultation: Anemia, melena small bowel AVMs  HPI: Lacey Vega is a 73 y.o. female known to Dr. Federico with previous history of peptic ulcer disease, history of achalasia status post p.o. EM 2019, asthma, chronic blood loss/iron  deficiency anemia requiring intermittent IV iron , diabetes mellitus and hypertension.  She does have history of sickle cell trait.  Patient has history of peripheral arterial disease with previous superficial femoral artery stenting 2022.  Workup in January 2024 with smaller aneurysms recurring in the area of endovascular repair.  Subsequent duplex imaging has not shown any evidence of endoleak. She had previously been on Plavix  and aspirin , over the past couple of years just aspirin . 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 Due to recurrent anemia, she had enteroscopy again and November 2024 at that time with finding of 1 duodenal AVM treated with APC.  She is followed chronically by hematology, has required intermittent IV iron . She was seen in our office yesterday after labs on 11/16/2023 had shown hemoglobin of 9.7/MCV 81/platelets 295.  She did receive IV Feraheme  per hematology at that time. She had a more overtly melenic stool on Tuesday of this week after taking her Linzess  for constipation.  Prior to that she had not really been noticing any dark or black stools.  She has not had a bowel movement since then.  She has no complaints of abdominal pain.  She has been feeling more fatigued over the past week or so and has noted more dizziness and kind of unsteady sensation with ambulation having to use her cane. She is also been undergoing evaluation  for back pain.  Labs in the office yesterday showed hemoglobin dropped to 7.5 and she was advised to come to the ER. She presented to the ER this morning.  Labs show WBC 4.5/hemoglobin 7.1/hematocrit 33.0 MCV 85 Sodium 139/potassium 4.7 BUN 22/creatinine 1.72 and LFTs within normal limits  She is receiving 2 units of packed RBCs.  Last colonoscopy June 2023-3 sessile polyps removed 3 to 4 mm in size, 1 small AVM found in the ascending colon treated with APC and a few diverticuli in the sigmoid descending transverse and ascending colon.   Past Medical History:  Diagnosis Date   Anemia    Anxiety    Asthma    Asthma    Bronchitis    hx   Colon polyps    hyperplastic   Diabetes (HCC)    mild, history of   DJD (degenerative joint disease), cervical    Dysphagia    history of   Dyspnea    Gallstones    GERD (gastroesophageal reflux disease)    H/O chest pain    secondary to anemia   Headache    History of arteriovenous malformation (AVM)    History of blood transfusion 10/2021   History of hiatal hernia    History of tobacco use    Hyperlipemia    Hypertension    Hypothyroidism    Iron  deficiency anemia    Sickle cell anemia (HCC)    patient is not aware of this   Thyroid  disease    TIA (transient ischemic attack)    was told that she possible  had one   Wears dentures    Wears glasses     Past Surgical History:  Procedure Laterality Date   ABDOMINAL AORTIC ENDOVASCULAR STENT GRAFT Bilateral 01/31/2021   Procedure: ENDOVASCULAR ANEURYSM REPAIR (EVAR)Bilateral Groin Cutdown, left femoral endaterectomy with bovine patch angioplasty.;  Surgeon: Magda Debby SAILOR, MD;  Location: [facility] OR;  Service: Vascular;  Laterality: Bilateral;   ABDOMINAL HYSTERECTOMY     ANTERIOR CERVICAL DECOMP/DISCECTOMY FUSION N/A 10/03/2016   Procedure: ANTERIOR CERVICAL DECOMPRESSION/DISCECTOMY FUSION CERVICAL THREE- CERVICAL FOUR;  Surgeon: Gillie Duncans, MD;  Location: [facility] OR;  Service:  Neurosurgery;  Laterality: N/A;  Right side approach   BOTOX  INJECTION  08/29/2015   Procedure: BOTOX  INJECTION;  Surgeon: Jerrell Sol, MD;  Location: [facility] ENDOSCOPY;  Service: Endoscopy;;   CHOLECYSTECTOMY     COLONOSCOPY  12/03/2011   Procedure: COLONOSCOPY;  Surgeon: Jerrell KYM Sol, MD;  Location: [facility] ENDOSCOPY;  Service: Endoscopy;  Laterality: N/A;   COLONOSCOPY N/A 11/27/2021   Procedure: COLONOSCOPY;  Surgeon: Federico Rosario BROCKS, MD;  Location: [facility];  Service: Gastroenterology;  Laterality: N/A;   ENTEROSCOPY N/A 07/07/2021   Procedure: ENTEROSCOPY;  Surgeon: Rosalie Kitchens, MD;  Location: [facility] ENDOSCOPY;  Service: Endoscopy;  Laterality: N/A;   ENTEROSCOPY N/A 08/06/2021   Procedure: ENTEROSCOPY;  Surgeon: Saintclair Jasper, MD;  Location: [facility] ENDOSCOPY;  Service: Gastroenterology;  Laterality: N/A;   ENTEROSCOPY N/A 11/27/2021   Procedure: SMALL  BOWEL ENTEROSCOPY;  Surgeon: Federico Rosario BROCKS, MD;  Location: [facility] ENDOSCOPY;  Service: Gastroenterology;  Laterality: N/A;   ENTEROSCOPY N/A 11/07/2022   Procedure: ENTEROSCOPY;  Surgeon: Charlanne Groom, MD;  Location: [facility] ENDOSCOPY;  Service: Gastroenterology;  Laterality: N/A;   ENTEROSCOPY N/A 04/03/2023   Procedure: ENTEROSCOPY;  Surgeon: Avram Lupita BRAVO, MD;  Location: [facility] ENDOSCOPY;  Service: Gastroenterology;  Laterality: N/A;   ESOPHAGEAL MANOMETRY N/A 07/13/2017   Procedure: ESOPHAGEAL MANOMETRY (EM);  Surgeon: Sol Jerrell, MD;  Location: [facility] ENDOSCOPY;  Service: Endoscopy;  Laterality: N/A;   ESOPHAGOGASTRODUODENOSCOPY  12/03/2011   Procedure: ESOPHAGOGASTRODUODENOSCOPY (EGD);  Surgeon: Jerrell KYM Sol, MD;  Location: [facility];  Service: Endoscopy;  Laterality: N/A;   ESOPHAGOGASTRODUODENOSCOPY (EGD) WITH PROPOFOL  N/A 08/29/2015   Procedure: ESOPHAGOGASTRODUODENOSCOPY (EGD) WITH PROPOFOL ;  Surgeon: Jerrell Sol, MD;  Location: [facility] ENDOSCOPY;  Service: Endoscopy;  Laterality: N/A;   ESOPHAGOGASTRODUODENOSCOPY (EGD) WITH PROPOFOL  N/A  05/17/2019   Procedure: ESOPHAGOGASTRODUODENOSCOPY (EGD) WITH PROPOFOL ;  Surgeon: Sol Jerrell, MD;  Location: [facility] ENDOSCOPY;  Service: Endoscopy;  Laterality: N/A;   ESOPHAGOGASTRODUODENOSCOPY (EGD) WITH PROPOFOL  N/A 04/24/2021   Procedure: ESOPHAGOGASTRODUODENOSCOPY (EGD) WITH PROPOFOL ;  Surgeon: Elicia Claw, MD;  Location: [facility] ENDOSCOPY;  Service: Gastroenterology;  Laterality: N/A;   GIVENS CAPSULE STUDY N/A 08/05/2018   Procedure: GIVENS CAPSULE STUDY;  Surgeon: Sol Jerrell, MD;  Location: [facility] ENDOSCOPY;  Service: Endoscopy;  Laterality: N/A;   GIVENS CAPSULE STUDY N/A 07/06/2021   Procedure: GIVENS CAPSULE STUDY;  Surgeon: Rosalie Kitchens, MD;  Location: [facility] ENDOSCOPY;  Service: Endoscopy;  Laterality: N/A;   HOT HEMOSTASIS  12/03/2011   Procedure: HOT HEMOSTASIS (ARGON PLASMA COAGULATION/BICAP);  Surgeon: Jerrell KYM Sol, MD;  Location: [facility];  Service: Endoscopy;  Laterality: N/A;   HOT HEMOSTASIS N/A 07/07/2021   Procedure: HOT HEMOSTASIS (ARGON PLASMA COAGULATION/BICAP);  Surgeon: Rosalie Kitchens, MD;  Location: [facility];  Service: Endoscopy;  Laterality: N/A;   HOT HEMOSTASIS N/A 08/06/2021   Procedure: HOT HEMOSTASIS (ARGON PLASMA COAGULATION/BICAP);  Surgeon: Saintclair Jasper, MD;  Location: [facility];  Service: Gastroenterology;  Laterality: N/A;  HOT HEMOSTASIS  11/27/2021   Procedure: HOT HEMOSTASIS (ARGON PLASMA COAGULATION/BICAP);  Surgeon: Federico Rosario BROCKS, MD;  Location: [facility];  Service: Gastroenterology;;  EGD and COLON   HOT HEMOSTASIS N/A 11/07/2022   Procedure: HOT HEMOSTASIS (ARGON PLASMA COAGULATION/BICAP);  Surgeon: Charlanne Groom, MD;  Location: [facility];  Service: Gastroenterology;  Laterality: N/A;   HOT HEMOSTASIS N/A 04/03/2023   Procedure: HOT HEMOSTASIS (ARGON PLASMA COAGULATION/BICAP);  Surgeon: Avram Lupita BRAVO, MD;  Location: [facility];  Service: Gastroenterology;  Laterality: N/A;   NECK SURGERY     PERIPHERAL VASCULAR INTERVENTION  03/29/2021    Procedure: PERIPHERAL VASCULAR INTERVENTION;  Surgeon: Magda Debby SAILOR, MD;  Location: [facility] INVASIVE CV LAB;  Service: Cardiovascular;;  Lt SFA Brachial Approach   POLYPECTOMY  11/27/2021   Procedure: POLYPECTOMY;  Surgeon: Federico Rosario BROCKS, MD;  Location: [facility];  Service: Gastroenterology;;   ROTATOR CUFF REPAIR     right   ROTATOR CUFF REPAIR Right 2014   THROMBECTOMY FEMORAL ARTERY Left 01/31/2021   Procedure: THROMBECTOMY POPLITEAL  ARTERY;  Surgeon: Magda Debby SAILOR, MD;  Location: [facility] OR;  Service: Vascular;  Laterality: Left;   TONSILLECTOMY      Prior to Admission medications   Medication Sig Start Date End Date Taking? Authorizing Provider  acetaminophen  (TYLENOL ) 500 MG tablet Take 500-1,000 mg by mouth every 6 (six) hours as needed for mild pain or headache.   Yes [provider]  aspirin  EC 81 MG tablet Take 81 mg by mouth daily. Swallow whole.   Yes [provider]  atorvastatin  (LIPITOR) 40 MG tablet Take 40 mg by mouth at bedtime.   Yes [provider]  Biotin 2500 MCG CHEW Chew 5,000 mcg by mouth daily in the afternoon.   Yes [provider]  Cinnamon 500 MG TABS Take 1,000 mg by mouth every evening.   Yes [provider]  donepezil  (ARICEPT ) 5 MG tablet Take 5 mg by mouth at bedtime.   Yes [provider]  ferrous gluconate  (FERGON) 324 MG tablet Take 324 mg by mouth daily with breakfast.   Yes [provider]  folic acid  (FOLVITE ) 800 MCG tablet Take 800 mcg by mouth daily.   Yes [provider]  levalbuterol (XOPENEX) 0.63 MG/3ML nebulizer solution Take 0.63 mg by nebulization every 6 (six) hours as needed for wheezing or shortness of breath. 10/13/23  Yes [provider]  levothyroxine  (SYNTHROID ) 125 MCG tablet Take 125 mcg by mouth See admin instructions. Take 125 mcg by mouth before breakfast on Mon/Tues/Wed/Thurs/Fri/Sat (take nothing on Sundays)   Yes [provider]  LINZESS   145 MCG CAPS capsule TAKE 1 CAPSULE BY MOUTH DAILY BEFORE BREAKFAST. 08/28/23  Yes Federico Rosario BROCKS, MD  montelukast  (SINGULAIR ) 10 MG tablet Take 10 mg by mouth daily in the afternoon.   Yes [provider]  Omega-3 Fatty Acids (FISH OIL BURP-LESS) 1200 MG CAPS Take 1,200 mg by mouth daily in the afternoon.   Yes [provider]  pantoprazole  (PROTONIX ) 40 MG tablet Take 1 tablet (40 mg total) by mouth daily before breakfast. 07/08/22  Yes Dorsey, Ying C, MD  spironolactone (ALDACTONE) 25 MG tablet Take 25 mg by mouth daily. 11/01/23  Yes [provider]  NAPOLEON INHUB 250-50 MCG/ACT AEPB Inhale 1 puff into the lungs in the morning and at bedtime.   Yes [provider]  albuterol  (VENTOLIN  HFA) 108 (90 Base) MCG/ACT inhaler Inhale 1-2 puffs into the lungs every 6 (six) hours  as needed for wheezing or shortness of breath.    [provider]  ascorbic acid (VITAMIN C) 500 MG tablet Take 500 mg by mouth daily.    [provider]  cyanocobalamin  (VITAMIN B12) 1000 MCG tablet Take 1,000 mcg by mouth daily.    [provider]  nitroGLYCERIN  (NITROSTAT ) 0.4 MG SL tablet Place 0.4 mg under the tongue every 5 (five) minutes as needed for chest pain.    [provider]    Current Facility-Administered Medications  Medication Dose Route Frequency Provider Last Rate Last Admin   0.9 %  sodium chloride  infusion (Manually program via Guardrails IV Fluids)   Intravenous Once Nivia Colon, PA-C       acetaminophen  (TYLENOL ) tablet 650 mg  650 mg Oral Q6H PRN Smith, Rondell A, MD       Or   acetaminophen  (TYLENOL ) suppository 650 mg  650 mg Rectal Q6H PRN Claudene Reeves A, MD       albuterol  (PROVENTIL ) (2.5 MG/3ML) 0.083% nebulizer solution 2.5 mg  2.5 mg Nebulization Q2H PRN Smith, Rondell A, MD       [START ON 12/05/2023] atorvastatin  (LIPITOR) tablet 40 mg  40 mg Oral QHS Smith, Rondell A, MD       calcium  gluconate 2 g/ 100 mL sodium chloride  IVPB   2 g Intravenous Once Smith, Rondell A, MD       donepezil  (ARICEPT ) tablet 5 mg  5 mg Oral QHS Smith, Rondell A, MD       [START ON 12/05/2023] fluticasone  furoate-vilanterol (BREO ELLIPTA ) 200-25 MCG/ACT 1 puff  1 puff Inhalation Daily Claudene Reeves LABOR, MD       [START ON 12/05/2023] levothyroxine  (SYNTHROID ) tablet 125 mcg  125 mcg Oral Once per day on Monday Tuesday Wednesday Thursday Friday Saturday Claudene Reeves LABOR, MD       montelukast  (SINGULAIR ) tablet 10 mg  10 mg Oral Q1500 Claudene Reeves LABOR, MD       [START ON 12/05/2023] pantoprazole  (PROTONIX ) EC tablet 40 mg  40 mg Oral QAC breakfast Claudene Reeves LABOR, MD       Current Outpatient Medications  Medication Sig Dispense Refill   acetaminophen  (TYLENOL ) 500 MG tablet Take 500-1,000 mg by mouth every 6 (six) hours as needed for mild pain or headache.     aspirin  EC 81 MG tablet Take 81 mg by mouth daily. Swallow whole.     atorvastatin  (LIPITOR) 40 MG tablet Take 40 mg by mouth at bedtime.     Biotin 2500 MCG CHEW Chew 5,000 mcg by mouth daily in the afternoon.     Cinnamon 500 MG TABS Take 1,000 mg by mouth every evening.     donepezil  (ARICEPT ) 5 MG tablet Take 5 mg by mouth at bedtime.     ferrous gluconate  (FERGON) 324 MG tablet Take 324 mg by mouth daily with breakfast.     folic acid  (FOLVITE ) 800 MCG tablet Take 800 mcg by mouth daily.     levalbuterol (XOPENEX) 0.63 MG/3ML nebulizer solution Take 0.63 mg by nebulization every 6 (six) hours as needed for wheezing or shortness of breath.     levothyroxine  (SYNTHROID ) 125 MCG tablet Take 125 mcg by mouth See admin instructions. Take 125 mcg by mouth before breakfast on Mon/Tues/Wed/Thurs/Fri/Sat (take nothing on Sundays)     LINZESS  145 MCG CAPS capsule TAKE 1 CAPSULE BY MOUTH DAILY BEFORE BREAKFAST. 30 capsule 6   montelukast  (SINGULAIR ) 10 MG tablet Take 10 mg by mouth  daily in the afternoon.     Omega-3 Fatty Acids (FISH OIL BURP-LESS) 1200 MG CAPS Take 1,200 mg by mouth daily in the  afternoon.     pantoprazole  (PROTONIX ) 40 MG tablet Take 1 tablet (40 mg total) by mouth daily before breakfast. 90 tablet 1   spironolactone (ALDACTONE) 25 MG tablet Take 25 mg by mouth daily.     WIXELA INHUB 250-50 MCG/ACT AEPB Inhale 1 puff into the lungs in the morning and at bedtime.     albuterol  (VENTOLIN  HFA) 108 (90 Base) MCG/ACT inhaler Inhale 1-2 puffs into the lungs every 6 (six) hours as needed for wheezing or shortness of breath.     ascorbic acid (VITAMIN C) 500 MG tablet Take 500 mg by mouth daily.     cyanocobalamin  (VITAMIN B12) 1000 MCG tablet Take 1,000 mcg by mouth daily.     nitroGLYCERIN  (NITROSTAT ) 0.4 MG SL tablet Place 0.4 mg under the tongue every 5 (five) minutes as needed for chest pain.      Allergies as of 12/04/2023 - Review Complete 12/04/2023  Allergen Reaction Noted   Oxycodone Shortness Of Breath 03/12/2012    Family History  Problem Relation Age of Onset   Diabetes Mother    Heart attack Brother    Cancer Maternal Aunt     Social History   Socioeconomic History   Marital status: Divorced    Spouse name: Not on file   Number of children: 2   Years of education: Not on file   Highest education level: Not on file  Occupational History   Occupation: group leader environmental services    Employer: [employer]    Comment: [employer]  Tobacco Use   Smoking status: Former    Current packs/day: 0.00    Types: Cigarettes    Quit date: 06/02/2000    Years since quitting: 23.5   Smokeless tobacco: Never  Vaping Use   Vaping status: Never Used  Substance and Sexual Activity   Alcohol use: No   Drug use: No   Sexual activity: Not on file  Other Topics Concern   Not on file  Social History Narrative   ** Merged History Encounter **       Social Drivers of Health   Financial Resource Strain: Not on file  Food Insecurity: No Food Insecurity (04/05/2023)   Hunger Vital Sign    Worried About Running Out of Food in the Last  Year: Never true    Ran Out of Food in the Last Year: Never true  Transportation Needs: No Transportation Needs (04/05/2023)   PRAPARE - Administrator, Civil Service (Medical): No    Lack of Transportation (Non-Medical): No  Physical Activity: Not on file  Stress: Not on file  Social Connections: Not on file  Intimate Partner Violence: Not At Risk (04/05/2023)   Humiliation, Afraid, Rape, and Kick questionnaire    Fear of Current or Ex-Partner: No    Emotionally Abused: No    Physically Abused: No    Sexually Abused: No    Review of Systems: Pertinent positive and negative review of systems were noted in the above HPI section.  All other review of systems was otherwise negative.   Physical Exam: Vital signs in last 24 hours: Temp:  [97.8 F (36.6 C)-98.3 F (36.8 C)] 97.8 F (36.6 C) (07/04 1335) Pulse Rate:  [76-80] 76 (07/04 1335) Resp:  [13-20] 13 (07/04 1335) BP: (101-110)/(54-73) 101/73 (07/04 1335)  SpO2:  [99 %-100 %] 100 % (07/04 1335)   General:   Alert,  Well-developed, well-nourished, elderly African-American female pleasant and cooperative in NAD family at bedside Head:  Normocephalic and atraumatic. Eyes:  Sclera clear, no icterus.   Conjunctiva pale Ears:  Normal auditory acuity. Nose:  No deformity, discharge,  or lesions. Mouth:  No deformity or lesions.   Neck:  Supple; no masses or thyromegaly. Lungs:  Clear throughout to auscultation.   No wheezes, crackles, or rhonchi.  Heart:  Regular rate and rhythm; no murmurs, clicks, rubs,  or gallops. Abdomen:  Soft,nontender, BS active,nonpalp mass or hsm.  Left abdominal scar, right upper quadrant scar Rectal: Not done Msk:  Symmetrical without gross deformities. . Pulses:  Normal pulses noted. Extremities:  Without clubbing or edema. Neurologic:  Alert and  oriented x4;  grossly normal neurologically. Skin:  Intact without significant lesions or rashes.. Psych:  Alert and cooperative. Normal mood  and affect.  Intake/Output from previous day: No intake/output data recorded. Intake/Output this shift: No intake/output data recorded.  Lab Results: Recent Labs    12/03/23 1608 12/04/23 1117  WBC  --  4.5  HGB 7.5* 7.1*  HCT 23.7* 23.0*  PLT  --  140*   BMET Recent Labs    12/04/23 1117  NA 139  K 4.2  CL 108  CO2 24  GLUCOSE 103*  BUN 22  CREATININE 1.42*  CALCIUM  8.3*   LFT Recent Labs    12/04/23 1117  PROT 6.4*  ALBUMIN  3.4*  AST 18  ALT 9  ALKPHOS 89  BILITOT 0.7   PT/INR No results for input(s): LABPROT, INR in the last 72 hours. Hepatitis Panel No results for input(s): HEPBSAG, HCVAB, HEPAIGM, HEPBIGM in the last 72 hours.   IMPRESSION:  #54 73 year old African-American female with history of recurrent anemia, chronic iron  deficiency, requiring intermittent IV iron .  And history of small bowel AVMs. Patient had an overt episode of melena 3 days ago at home, was seen in our office yesterday and found to have hemoglobin drop from 9.7 on 11/16/2023 down to 7.5 yesterday.  She was advised to come to the ER for admission. Generally she has not had any further bowel movement since that time, however hemoglobin down to 7.1 today. He has been symptomatic with unsteady gait and fatigue  Suspect recurrent acute/subacute GI blood loss secondary to recurrent small bowel AVMs.  Last enteroscopy November 2024 with 1 duodenal AVM treated with APC and prior to that June 2024 had APC of 6 AVMs in the duodenum/jejunum.  #2 history of achalasia status post p.o. EM 2019 #3 asthma 4.  Diabetes mellitus 5.  Hypertension 6.  Remote history of peptic ulcer disease 7.  Diverticulosis 8.  Sickle cell trait 9.  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 past  midnight Patient is being transfused 2 units of packed RBCs today, will continue to trend hemoglobin thereafter and transfuse to keep her hemoglobin above 7.5 Patient will tentatively be scheduled for small bowel endoscopy with Dr. Federico for tomorrow 12/05/2023.  Due to large number of procedures scheduled for tomorrow it is possible this will not be able to be done and will be moved to Sunday.  I discussed the enteroscopy and timing with the patient in detail including indications risk and benefits and she is agreeable  to proceed. GI will follow with you  Len Azeez PA-C 12/04/2023, 2:56 PM

## 2023-12-05 ENCOUNTER — Inpatient Hospital Stay (HOSPITAL_COMMUNITY): Admitting: Certified Registered Nurse Anesthetist

## 2023-12-05 ENCOUNTER — Encounter (HOSPITAL_COMMUNITY)
Admission: EM | Disposition: A | Discharge status: Home / Self Care | Payer: Self-pay | Source: Home / Self Care | Attending: Internal Medicine

## 2023-12-05 ENCOUNTER — Encounter (HOSPITAL_COMMUNITY): Payer: Self-pay | Admitting: Internal Medicine

## 2023-12-05 DIAGNOSIS — K31819 Angiodysplasia of stomach and duodenum without bleeding: Secondary | ICD-10-CM

## 2023-12-05 DIAGNOSIS — K921 Melena: Secondary | ICD-10-CM

## 2023-12-05 DIAGNOSIS — K5520 Angiodysplasia of colon without hemorrhage: Secondary | ICD-10-CM

## 2023-12-05 DIAGNOSIS — Z8719 Personal history of other diseases of the digestive system: Secondary | ICD-10-CM

## 2023-12-05 DIAGNOSIS — I739 Peripheral vascular disease, unspecified: Secondary | ICD-10-CM | POA: Diagnosis not present

## 2023-12-05 DIAGNOSIS — K922 Gastrointestinal hemorrhage, unspecified: Secondary | ICD-10-CM | POA: Diagnosis not present

## 2023-12-05 DIAGNOSIS — I129 Hypertensive chronic kidney disease with stage 1 through stage 4 chronic kidney disease, or unspecified chronic kidney disease: Secondary | ICD-10-CM | POA: Diagnosis not present

## 2023-12-05 DIAGNOSIS — J449 Chronic obstructive pulmonary disease, unspecified: Secondary | ICD-10-CM | POA: Diagnosis not present

## 2023-12-05 DIAGNOSIS — N1832 Chronic kidney disease, stage 3b: Secondary | ICD-10-CM

## 2023-12-05 HISTORY — PX: ENTEROSCOPY: SHX5533

## 2023-12-05 HISTORY — PX: HOT HEMOSTASIS: SHX5433

## 2023-12-05 LAB — CBC
HCT: 28.2 % — ABNORMAL LOW (ref 36.0–46.0)
HCT: 30.6 % — ABNORMAL LOW (ref 36.0–46.0)
Hemoglobin: 9.1 g/dL — ABNORMAL LOW (ref 12.0–15.0)
Hemoglobin: 9.9 g/dL — ABNORMAL LOW (ref 12.0–15.0)
MCH: 26.8 pg (ref 26.0–34.0)
MCH: 27.1 pg (ref 26.0–34.0)
MCHC: 32.3 g/dL (ref 30.0–36.0)
MCHC: 32.4 g/dL (ref 30.0–36.0)
MCV: 82.9 fL (ref 80.0–100.0)
MCV: 83.8 fL (ref 80.0–100.0)
Platelets: 209 K/uL (ref 150–400)
Platelets: 226 K/uL (ref 150–400)
RBC: 3.4 MIL/uL — ABNORMAL LOW (ref 3.87–5.11)
RBC: 3.65 MIL/uL — ABNORMAL LOW (ref 3.87–5.11)
RDW: 15.7 % — ABNORMAL HIGH (ref 11.5–15.5)
RDW: 15.9 % — ABNORMAL HIGH (ref 11.5–15.5)
WBC: 5 K/uL (ref 4.0–10.5)
WBC: 6.6 K/uL (ref 4.0–10.5)
nRBC: 0 % (ref 0.0–0.2)
nRBC: 0 % (ref 0.0–0.2)

## 2023-12-05 LAB — BPAM RBC
Blood Product Expiration Date: 202507162359
Blood Product Expiration Date: 202508122359
ISSUE DATE / TIME: 202507041640
ISSUE DATE / TIME: 202507041955
Unit Type and Rh: 5100
Unit Type and Rh: 5100

## 2023-12-05 LAB — BASIC METABOLIC PANEL WITH GFR
Anion gap: 7 (ref 5–15)
BUN: 13 mg/dL (ref 8–23)
CO2: 24 mmol/L (ref 22–32)
Calcium: 8.9 mg/dL (ref 8.9–10.3)
Chloride: 111 mmol/L (ref 98–111)
Creatinine, Ser: 1.35 mg/dL — ABNORMAL HIGH (ref 0.44–1.00)
GFR, Estimated: 41 mL/min — ABNORMAL LOW (ref >60)
Glucose, Bld: 104 mg/dL — ABNORMAL HIGH (ref 70–99)
Potassium: 4.3 mmol/L (ref 3.5–5.1)
Sodium: 142 mmol/L (ref 135–145)

## 2023-12-05 LAB — TYPE AND SCREEN
ABO/RH(D): A POS
Antibody Screen: NEGATIVE
Donor AG Type: NEGATIVE
Donor AG Type: NEGATIVE
Unit division: 0
Unit division: 0

## 2023-12-05 LAB — GLUCOSE, CAPILLARY: Glucose-Capillary: 102 mg/dL — ABNORMAL HIGH (ref 70–99)

## 2023-12-05 SURGERY — ENTEROSCOPY
Anesthesia: Monitor Anesthesia Care

## 2023-12-05 MED ORDER — FERROUS SULFATE 325 (65 FE) MG PO TABS: 325.0000 mg | ORAL_TABLET | Freq: Every day | ORAL | Status: DC

## 2023-12-05 MED ORDER — PROPOFOL 10 MG/ML IV BOLUS
INTRAVENOUS | Status: DC | PRN
  Administered 2023-12-05: 20 mg via INTRAVENOUS
  Administered 2023-12-05 (×4): 30 mg via INTRAVENOUS
  Administered 2023-12-05: 70 mg via INTRAVENOUS
  Administered 2023-12-05 (×3): 30 mg via INTRAVENOUS
  Administered 2023-12-05 (×2): 20 mg via INTRAVENOUS
  Administered 2023-12-05: 30 mg via INTRAVENOUS

## 2023-12-05 MED ORDER — METHOCARBAMOL 500 MG PO TABS
500.0000 mg | ORAL_TABLET | Freq: Once | ORAL | Status: AC | PRN
Start: 2023-12-05 — End: 2023-12-05
  Administered 2023-12-05: 500 mg via ORAL
  Filled 2023-12-05: qty 1

## 2023-12-05 MED ORDER — PHENYLEPHRINE HCL (PRESSORS) 10 MG/ML IV SOLN
INTRAVENOUS | Status: DC | PRN
  Administered 2023-12-05: 160 ug via INTRAVENOUS
  Administered 2023-12-05: 80 ug via INTRAVENOUS

## 2023-12-05 MED ORDER — PANTOPRAZOLE SODIUM 40 MG IV SOLR
40.0000 mg | Freq: Two times a day (BID) | INTRAVENOUS | Status: DC
  Administered 2023-12-05 – 2023-12-07 (×5): 40 mg via INTRAVENOUS
  Filled 2023-12-05 (×5): qty 10

## 2023-12-05 MED ORDER — SODIUM CHLORIDE 0.9 % IV SOLN: INTRAVENOUS | Status: DC | PRN

## 2023-12-05 MED ORDER — LIDOCAINE 5 % EX PTCH
1.0000 | MEDICATED_PATCH | CUTANEOUS | Status: DC
  Administered 2023-12-05 – 2023-12-06 (×2): 1 via TRANSDERMAL
  Filled 2023-12-05 (×2): qty 1

## 2023-12-05 MED ORDER — LIDOCAINE 2% (20 MG/ML) 5 ML SYRINGE
INTRAMUSCULAR | Status: DC | PRN
  Administered 2023-12-05: 100 mg via INTRAVENOUS

## 2023-12-05 NOTE — Anesthesia Preprocedure Evaluation (Addendum)
 Anesthesia Evaluation  Patient identified by MRN, date of birth, ID band Patient awake    Reviewed: Allergy & Precautions, NPO status , Patient's Chart, lab work & pertinent test results  Airway Mallampati: II  TM Distance: >3 FB Neck ROM: Full    Dental  (+) Edentulous Upper, Edentulous Lower   Pulmonary asthma , COPD,  COPD inhaler, former smoker   Pulmonary exam normal        Cardiovascular hypertension, + Peripheral Vascular Disease  Normal cardiovascular exam     Neuro/Psych  Headaches  Anxiety     TIA   GI/Hepatic Neg liver ROS, hiatal hernia,GERD  Medicated and Controlled,,  Endo/Other  diabetesHypothyroidism    Renal/GU Renal disease     Musculoskeletal negative musculoskeletal ROS (+)    Abdominal  (+) + obese  Peds  Hematology  (+) Blood dyscrasia, Sickle cell trait and anemia   Anesthesia Other Findings Anemia, melena, history of small bowel AVMs  Reproductive/Obstetrics                              Anesthesia Physical Anesthesia Plan  ASA: 3  Anesthesia Plan: MAC   Post-op Pain Management:    Induction:   PONV Risk Score and Plan: 2 and Propofol  infusion and Treatment may vary due to age or medical condition  Airway Management Planned: Nasal Cannula  Additional Equipment:   Intra-op Plan:   Post-operative Plan:   Informed Consent: I have reviewed the patients History and Physical, chart, labs and discussed the procedure including the risks, benefits and alternatives for the proposed anesthesia with the patient or authorized representative who has indicated his/her understanding and acceptance.       Plan Discussed with: CRNA  Anesthesia Plan Comments:          Anesthesia Quick Evaluation

## 2023-12-05 NOTE — Interval H&P Note (Signed)
 History and Physical Interval Note:  12/05/2023 2:55 PM  Lacey Vega  has presented today for surgery, with the diagnosis of Anemia, melena, history of small bowel AVMs.  The various methods of treatment have been discussed with the patient and family. After consideration of risks, benefits and other options for treatment, the patient has consented to  Procedure(s): ENTEROSCOPY (N/A) as a surgical intervention.  The patient's history has been reviewed, patient examined, no change in status, stable for surgery.  I have reviewed the patient's chart and labs.  Questions were answered to the patient's satisfaction.     Teala Daffron C Likisha Alles

## 2023-12-05 NOTE — Op Note (Signed)
 [facility] Patient Name: Lacey Vega Procedure Date : 12/05/2023 MRN: [id number] Attending MD: Rosario Estefana Kidney , , [id number] Date of Birth: 13-Jun-1950 CSN: [id number] Age: 73 Admit Type: Inpatient Procedure:                Small bowel enteroscopy Indications:              Anemia, Melena Providers:                Rosario Estefana Kidney, Collene Edu, RN, Haskel Chris, Technician Referring MD:             Hospitalist team Medicines:                Monitored Anesthesia Care Complications:            No immediate complications. Estimated Blood Loss:     Estimated blood loss was minimal. Procedure:                Pre-Anesthesia Assessment:                           - Prior to the procedure, a History and Physical                            was performed, and patient medications and                            allergies were reviewed. The patient's tolerance of                            previous anesthesia was also reviewed. The risks                            and benefits of the procedure and the sedation                            options and risks were discussed with the patient.                            All questions were answered, and informed consent                            was obtained. Prior Anticoagulants: The patient has                            taken no anticoagulant or antiplatelet agents. ASA                            Grade Assessment: III - A patient with severe                            systemic disease. After reviewing the risks and  benefits, the patient was deemed in satisfactory                            condition to undergo the procedure.                           After obtaining informed consent, the endoscope was                            passed under direct vision. Throughout the                            procedure, the patient's blood pressure, pulse, and                             oxygen  saturations were monitored continuously. The                            PCF-190TL ([id number] Olympus colonoscope was                            introduced through the mouth and advanced to the                            proximal jejunum. The small bowel enteroscopy was                            accomplished without difficulty. The patient                            tolerated the procedure well. Scope In: Scope Out: Findings:      The examined esophagus was normal.      The entire examined stomach was normal.      Three angioectasias with no bleeding were found in the second portion of       the duodenum, in the third portion of the duodenum and in the fourth       portion of the duodenum. Coagulation for bleeding prevention using argon       plasma at 0.8 liters/minute and 20 watts was successful.      Two angioectasias with no bleeding were found in the proximal jejunum.       Coagulation for bleeding prevention using argon plasma at 0.8       liters/minute and 20 watts was successful. Impression:               - Normal esophagus.                           - Normal stomach.                           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                            Treated with argon plasma coagulation (APC).                           -  Two non-bleeding angioectasias in the jejunum.                            Treated with argon plasma coagulation (APC).                           - No specimens collected. Recommendation:           - Return patient to hospital ward for ongoing care.                           - It is suspected that the patient's melena and                            anemia are due to bleeding from small bowel                            angioectasias.                           - We will arrange for outpatient GI follow up.                           - The findings and recommendations were discussed                            with the  patient. Procedure Code(s):        --- Professional ---                           [phone], Small intestinal endoscopy, enteroscopy                            beyond second portion of duodenum, not including                            ileum; with control of bleeding (eg, injection,                            bipolar cautery, unipolar cautery, laser, heater                            probe, stapler, plasma coagulator) Diagnosis Code(s):        --- Professional ---                           X68.180, Angiodysplasia of stomach and duodenum                            without bleeding                           K55.20, Angiodysplasia of colon without hemorrhage                           D64.9, Anemia, unspecified CPT copyright 2022 American Medical  Association. All rights reserved. The codes documented in this report are preliminary and upon coder review may  be revised to meet current compliance requirements. Dr Estefana Federico Rosario Estefana Federico,  12/05/2023 4:34:18 PM Number of Addenda: 0

## 2023-12-05 NOTE — Anesthesia Postprocedure Evaluation (Signed)
 Anesthesia Post Note  Patient: Lacey Vega  Procedure(s) Performed: ENTEROSCOPY EGD, WITH ARGON PLASMA COAGULATION     Patient location during evaluation: Endoscopy Anesthesia Type: MAC Level of consciousness: awake Pain management: pain level controlled Vital Signs Assessment: post-procedure vital signs reviewed and stable Respiratory status: spontaneous breathing, nonlabored ventilation and respiratory function stable Cardiovascular status: blood pressure returned to baseline and stable Postop Assessment: no apparent nausea or vomiting Anesthetic complications: no   There were no known notable events for this encounter.  Last Vitals:  Vitals:   12/05/23 1630 12/05/23 1645  BP: (!) 102/55 116/62  Pulse: 76 71  Resp: (!) 30 (!) 22  Temp:  36.4 C  SpO2: 96% 98%    Last Pain:  Vitals:   12/05/23 1645  TempSrc:   PainSc: 0-No pain                 Bentley Haralson P Mansoor Hillyard

## 2023-12-05 NOTE — Plan of Care (Signed)
   Problem: Education: Goal: Knowledge of General Education information will improve Description: Including pain rating scale, medication(s)/side effects and non-pharmacologic comfort measures Outcome: Progressing   Problem: Activity: Goal: Risk for activity intolerance will decrease Outcome: Progressing   Problem: Nutrition: Goal: Adequate nutrition will be maintained Outcome: Progressing   Problem: Coping: Goal: Level of anxiety will decrease Outcome: Progressing

## 2023-12-05 NOTE — Progress Notes (Signed)
 PROGRESS NOTE        PATIENT DETAILS Name: Lacey Vega Age: 73 y.o. Sex: female Date of Birth: [birth year] Admit Date: 12/04/2023 Admitting Physician Maximino DELENA Sharps, MD ERE:Izjw, Camellia CROME, MD  Brief Summary: Patient is a 73 y.o.  female with history of prior GI bleeding secondary to AVMs-presented with recurrent GI bleeding and acute blood loss anemia.  Significant events: 7/4>> admit to [facility]  Significant studies: None  Significant microbiology data: None  Procedures: None  Consults: GI  Subjective: No melena overnight-lying comfortably in bed.  Objective: Vitals: Blood pressure (!) 106/51, pulse 73, temperature 98.3 F (36.8 C), temperature source Oral, resp. rate 13, SpO2 98%.   Exam: Gen Exam:Alert awake-not in any distress HEENT:atraumatic, normocephalic Chest: B/L clear to auscultation anteriorly CVS:S1S2 regular Abdomen:soft non tender, non distended Extremities:no edema Neurology: Non focal Skin: no rash  Pertinent Labs/Radiology:    Latest Ref Rng & Units 12/05/2023    6:35 AM 12/04/2023    2:34 PM 12/04/2023   11:17 AM  CBC  WBC 4.0 - 10.5 K/uL 5.0   4.5   Hemoglobin 12.0 - 15.0 g/dL 9.1  6.4  7.1   Hematocrit 36.0 - 46.0 % 28.2  20.7  23.0   Platelets 150 - 400 K/uL 209   140     Lab Results  Component Value Date   NA 142 12/05/2023   K 4.3 12/05/2023   CL 111 12/05/2023   CO2 24 12/05/2023      Assessment/Plan: Upper GI bleeding secondary to known AVM-with acute blood loss anemia. No melena overnight Hb stable-s/p 2 units of PRBC 7/4 Continue PPI EGD/enteroscopy planned for later today or tomorrow Continue to follow CBC  Atypical chest pain Resolved Probably GI related Troponins negative.  CKD stage IIIb Close to baseline  PAD AAA-s/p endovascular repair 2022 No longer on DAPT-on aspirin -currently on hold Continue statin  HLD Statin  Hypothyroidism Synthroid   GERD PPI  History of  sickle cell trait  Class 1 Obesity: Estimated body mass index is 31.62 kg/m as calculated from the following:   Height as of 12/03/23: 5' 5 (1.651 m).   Weight as of 12/03/23: 86.2 kg.   Code status:   Code Status: Full Code   DVT Prophylaxis: SCDs Start: 12/04/23 1349   Family Communication: None at bedside   Disposition Plan: Status is: Inpatient Remains inpatient appropriate because: Severity of illness   Planned Discharge Destination:Home   Diet: Diet Order             Diet NPO time specified  Diet effective midnight                     Antimicrobial agents: Anti-infectives (From admission, onward)    None        MEDICATIONS: Scheduled Meds:  sodium chloride    Intravenous Once   atorvastatin   40 mg Oral QHS   donepezil   5 mg Oral QHS   fluticasone  furoate-vilanterol  1 puff Inhalation Daily   levothyroxine   125 mcg Oral Once per day on Monday Tuesday Wednesday Thursday Friday Saturday   montelukast   10 mg Oral Q1500   pantoprazole  (PROTONIX ) IV  40 mg Intravenous Q12H   Continuous Infusions: PRN Meds:.acetaminophen  **OR** acetaminophen , albuterol    I have personally reviewed following labs and imaging studies  LABORATORY DATA: CBC: Recent  Labs  Lab 12/03/23 1608 12/04/23 1117 12/04/23 1434 12/05/23 0635  WBC  --  4.5  --  5.0  HGB 7.5* 7.1* 6.4* 9.1*  HCT 23.7* 23.0* 20.7* 28.2*  MCV  --  85.2  --  82.9  PLT  --  140*  --  209    Basic Metabolic Panel: Recent Labs  Lab 12/04/23 1117 12/05/23 0635  NA 139 142  K 4.2 4.3  CL 108 111  CO2 24 24  GLUCOSE 103* 104*  BUN 22 13  CREATININE 1.42* 1.35*  CALCIUM  8.3* 8.9    GFR: Estimated Creatinine Clearance: 40.3 mL/min (A) (by C-G formula based on SCr of 1.35 mg/dL (H)).  Liver Function Tests: Recent Labs  Lab 12/04/23 1117  AST 18  ALT 9  ALKPHOS 89  BILITOT 0.7  PROT 6.4*  ALBUMIN  3.4*   No results for input(s): LIPASE, AMYLASE in the last 168 hours. No  results for input(s): AMMONIA in the last 168 hours.  Coagulation Profile: No results for input(s): INR, PROTIME in the last 168 hours.  Cardiac Enzymes: No results for input(s): CKTOTAL, CKMB, CKMBINDEX, TROPONINI in the last 168 hours.  BNP (last 3 results) No results for input(s): PROBNP in the last 8760 hours.  Lipid Profile: No results for input(s): CHOL, HDL, LDLCALC, TRIG, CHOLHDL, LDLDIRECT in the last 72 hours.  Thyroid  Function Tests: No results for input(s): TSH, T4TOTAL, FREET4, T3FREE, THYROIDAB in the last 72 hours.  Anemia Panel: No results for input(s): VITAMINB12, FOLATE, FERRITIN, TIBC, IRON , RETICCTPCT in the last 72 hours.  Urine analysis:    Component Value Date/Time   COLORURINE YELLOW 03/27/2009 1801   APPEARANCEUR CLOUDY (A) 03/27/2009 1801   LABSPEC 1.003 (L) 03/27/2009 1801   PHURINE 6.5 03/27/2009 1801   GLUCOSEU NEGATIVE 03/27/2009 1801   HGBUR NEGATIVE 03/27/2009 1801   BILIRUBINUR NEGATIVE 03/27/2009 1801   KETONESUR NEGATIVE 03/27/2009 1801   PROTEINUR NEGATIVE 03/27/2009 1801   UROBILINOGEN 0.2 03/27/2009 1801   NITRITE NEGATIVE 03/27/2009 1801   LEUKOCYTESUR  03/27/2009 1801    NEGATIVE MICROSCOPIC NOT DONE ON URINES WITH NEGATIVE PROTEIN, BLOOD, LEUKOCYTES, NITRITE, OR GLUCOSE <1000 mg/dL.    Sepsis Labs: Lactic Acid, Venous No results found for: LATICACIDVEN  MICROBIOLOGY: No results found for this or any previous visit (from the past 240 hours).  RADIOLOGY STUDIES/RESULTS: MR LUMBAR SPINE WO CONTRAST Result Date: 12/03/2023 MR LUMBAR SPINE WITHOUT IV CONTRAST COMPARISON: None available CLINICAL HISTORY: None available TECHNIQUE: SAG T2, SAG T1, SAG STIR, AX T2, AX T1 without IV contrast. FINDINGS: There is normal alignment of the lumbar spine. Mild multilevel disc desiccation and mild-to-moderate facet arthrosis. There is no vertebral body height loss, subluxation or marrow replacing  process. The sacrum and SI joints are unremarkable so far as visualized. Conus and cauda equina are unremarkable. T12-L1: There is no focal disc protrusion, foraminal or spinal stenosis. L1-2: There is no focal disc protrusion, foraminal or spinal stenosis. Mild facet arthrosis. L2-3: Mild broad-based bulge slightly effacing the ventral thecal sac. Moderate facet arthrosis bilaterally. There is caudal foraminal narrowing, left slightly greater than right. No impingement of the descending nerve roots or exiting nerves. L3-4: Mild disc desiccation moderate facet arthrosis. No significant foraminal or spinal stenosis. L4-5: Broad-based disc osteophyte moderate facet arthrosis. No significant foraminal or spinal stenosis. L5-S1: There is a transitional vertebral level on the left with fusion of the L5 vertebral body in the S1 vertebral body best seen on sagittal image 14 and  15. No significant foraminal or spinal stenosis. Mild facet arthrosis is There is a large infrarenal abdominal aortic aneurysm measuring 5.4 cm in AP dimension, 5.5 cm in transverse dimension and 10 cm in length. There is an endoluminal stent graft in place. Size of the native aneurysm is relatively stable when compared with the prior CTA. IMPRESSION: Mild degenerative disc desiccation facet arthrosis. No significant foraminal or spinal stenosis. Mild multilevel facet arthrosis most notably on the right at L3-4. No acute abnormality. Stable infrarenal abdominal aortic aneurysm with endoluminal stent graft in place. Electronically signed by: Norleen Satchel MD 12/03/2023 01:31 PM EDT RP Workstation: [id number]     LOS: 1 day   Donalda Applebaum, MD  [facility]    To contact the attending provider between 7A-7P or the covering provider during after hours 7P-7A, please log into the web site [email or web address] and access using universal [place] password for that web site. If you do not have the password, please call the hospital  operator.  12/05/2023, 9:59 AM

## 2023-12-05 NOTE — Transfer of Care (Signed)
 Immediate Anesthesia Transfer of Care Note  Patient: Lacey Vega  Procedure(s) Performed: ENTEROSCOPY EGD, WITH ARGON PLASMA COAGULATION  Patient Location: PACU  Anesthesia Type:MAC  Level of Consciousness: drowsy and patient cooperative  Airway & Oxygen  Therapy: Patient Spontanous Breathing and Patient connected to face mask oxygen   Post-op Assessment: Report given to RN and Post -op Vital signs reviewed and stable  Post vital signs: Reviewed and stable  Last Vitals:  Vitals Value Taken Time  BP 104/55 12/05/23 1628  Temp    Pulse 75 12/05/23 16:28  Resp 24 12/05/23 16:28  SpO2 96 % 12/05/23 16:28  Vitals shown include unfiled device data.    Complications: No notable events documented.

## 2023-12-05 NOTE — Plan of Care (Signed)

## 2023-12-06 ENCOUNTER — Inpatient Hospital Stay (HOSPITAL_COMMUNITY)

## 2023-12-06 ENCOUNTER — Encounter (HOSPITAL_COMMUNITY): Payer: Self-pay | Admitting: Internal Medicine

## 2023-12-06 DIAGNOSIS — N1832 Chronic kidney disease, stage 3b: Secondary | ICD-10-CM | POA: Diagnosis not present

## 2023-12-06 DIAGNOSIS — I739 Peripheral vascular disease, unspecified: Secondary | ICD-10-CM | POA: Diagnosis not present

## 2023-12-06 DIAGNOSIS — K922 Gastrointestinal hemorrhage, unspecified: Secondary | ICD-10-CM | POA: Diagnosis not present

## 2023-12-06 DIAGNOSIS — Z8719 Personal history of other diseases of the digestive system: Secondary | ICD-10-CM | POA: Diagnosis not present

## 2023-12-06 LAB — URINALYSIS, W/ REFLEX TO CULTURE (INFECTION SUSPECTED)
Bacteria, UA: NONE SEEN
Bilirubin Urine: NEGATIVE
Glucose, UA: NEGATIVE mg/dL
Hgb urine dipstick: NEGATIVE
Ketones, ur: NEGATIVE mg/dL
Leukocytes,Ua: NEGATIVE
Nitrite: NEGATIVE
Protein, ur: NEGATIVE mg/dL
Specific Gravity, Urine: 1.009 (ref 1.005–1.030)
pH: 6 (ref 5.0–8.0)

## 2023-12-06 LAB — CBC
HCT: 31.7 % — ABNORMAL LOW (ref 36.0–46.0)
Hemoglobin: 9.7 g/dL — ABNORMAL LOW (ref 12.0–15.0)
MCH: 25.9 pg — ABNORMAL LOW (ref 26.0–34.0)
MCHC: 30.6 g/dL (ref 30.0–36.0)
MCV: 84.8 fL (ref 80.0–100.0)
Platelets: 239 K/uL (ref 150–400)
RBC: 3.74 MIL/uL — ABNORMAL LOW (ref 3.87–5.11)
RDW: 16.1 % — ABNORMAL HIGH (ref 11.5–15.5)
WBC: 15.3 K/uL — ABNORMAL HIGH (ref 4.0–10.5)
nRBC: 0 % (ref 0.0–0.2)

## 2023-12-06 LAB — COMPREHENSIVE METABOLIC PANEL WITH GFR
ALT: 8 U/L (ref 0–44)
AST: 17 U/L (ref 15–41)
Albumin: 3.1 g/dL — ABNORMAL LOW (ref 3.5–5.0)
Alkaline Phosphatase: 77 U/L (ref 38–126)
Anion gap: 9 (ref 5–15)
BUN: 14 mg/dL (ref 8–23)
CO2: 21 mmol/L — ABNORMAL LOW (ref 22–32)
Calcium: 8.7 mg/dL — ABNORMAL LOW (ref 8.9–10.3)
Chloride: 111 mmol/L (ref 98–111)
Creatinine, Ser: 1.61 mg/dL — ABNORMAL HIGH (ref 0.44–1.00)
GFR, Estimated: 34 mL/min — ABNORMAL LOW (ref >60)
Glucose, Bld: 83 mg/dL (ref 70–99)
Potassium: 3.9 mmol/L (ref 3.5–5.1)
Sodium: 141 mmol/L (ref 135–145)
Total Bilirubin: 0.9 mg/dL (ref 0.0–1.2)
Total Protein: 6.3 g/dL — ABNORMAL LOW (ref 6.5–8.1)

## 2023-12-06 LAB — PROCALCITONIN: Procalcitonin: 1.32 ng/mL

## 2023-12-06 MED ORDER — SODIUM CHLORIDE 0.9 % IV SOLN
3.0000 g | Freq: Four times a day (QID) | INTRAVENOUS | Status: DC
  Administered 2023-12-06 – 2023-12-07 (×4): 3 g via INTRAVENOUS
  Filled 2023-12-06 (×4): qty 8

## 2023-12-06 MED ORDER — BENZONATATE 100 MG PO CAPS: 200.0000 mg | ORAL_CAPSULE | Freq: Three times a day (TID) | ORAL | Status: DC | PRN

## 2023-12-06 MED ORDER — MENTHOL 3 MG MT LOZG
1.0000 | LOZENGE | OROMUCOSAL | Status: DC | PRN
  Administered 2023-12-06 – 2023-12-07 (×3): 3 mg via ORAL
  Filled 2023-12-06: qty 9

## 2023-12-06 NOTE — Plan of Care (Signed)

## 2023-12-06 NOTE — TOC Initial Note (Signed)
 Transition of Care [facility]) - Initial/Assessment Note    Patient Details  Name: Lacey Vega MRN: [id number] Date of Birth: Apr 22, 1951  [facility] CM/SW Contact:    Marval Gell, RN Phone Number: 12/06/2023, 3:06 PM  Clinical Narrative:                  Per chart review Patient admitted from home for GIB Insured, has PCP. Anticipate DC to home when medically optimized.  TOC Will continue to follow  Expected Discharge Plan: Home/Self Care Barriers to Discharge: Continued Medical Work up   Patient Goals and CMS Choice            Expected Discharge Plan and Services   Discharge Planning Services: CM Consult   Living arrangements for the past 2 months: Single Family Home                                      Prior Living Arrangements/Services Living arrangements for the past 2 months: Single Family Home                     Activities of Daily Living   ADL Screening (condition at time of admission) Independently performs ADLs?: Yes (appropriate for developmental age) Is the patient deaf or have difficulty hearing?: No Does the patient have difficulty seeing, even when wearing glasses/contacts?: No Does the patient have difficulty concentrating, remembering, or making decisions?: No  Permission Sought/Granted                  Emotional Assessment              Admission diagnosis:  Upper GI bleed [K92.2] Patient Active Problem List   Diagnosis Date Noted   Upper GI bleed 12/04/2023   History of angiodysplasia of intestinal tract 12/04/2023   Chronic kidney disease, stage 3b (HCC) 12/04/2023   Chest pain 12/04/2023   Hypocalcemia 12/04/2023   Thrombocytopenia (HCC) 12/04/2023   Cognitive impairment 12/04/2023   GERD (gastroesophageal reflux disease) 12/04/2023   Obesity (BMI 30-39.9) 12/04/2023   Melena 04/02/2023   Symptomatic anemia 04/02/2023   Angiodysplasia of stomach and duodenum 04/02/2023   GI bleed  04/01/2023   Chronic kidney disease, stage 3a (HCC) 07/05/2021   Iron  deficiency anemia due to chronic blood loss 05/09/2021   Acute on chronic blood loss anemia 04/22/2021   AKI (acute kidney injury) (HCC) 04/22/2021   Epistaxis 04/22/2021   PAD (peripheral artery disease) (HCC) 04/22/2021   HLD (hyperlipidemia) 04/22/2021   Sickle cell trait (HCC) 04/22/2021   AAA (abdominal aortic aneurysm) 02/01/2021   Aortic aneurysm (HCC) 01/31/2021   Achalasia 10/11/2017   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   DJD (degenerative joint disease), cervical    Iron  deficiency anemia    History of arteriovenous malformation (AVM)    History of tobacco use    Asthma    H/O chest pain    Diabetes (HCC)    Dysphagia    Iron  deficiency anemia, unspecified 12/03/2011   Hypothyroidism (acquired) 10/07/2010    Class: Chronic   DYSPHAGIA UNSPECIFIED 10/05/2009   ANEMIA 08/20/2009   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   DM 04/17/2009   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/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    Class: Chronic   PCP:  Addie Camellia CROME, MD Pharmacy:   [id number] - [place], [place] - [address] AT  [address] [facility] [address] [place] [address] Phone: [phone] Fax: [phone]     Social Drivers of Health (SDOH) Social History: SDOH Screenings   Food Insecurity: No Food Insecurity (12/04/2023)  Housing: Unknown (12/06/2023)  Transportation Needs: No Transportation Needs (12/06/2023)  Utilities: Not At Risk (12/06/2023)  Social Connections: Unknown (12/06/2023)  Tobacco Use: Medium Risk (12/05/2023)   SDOH Interventions:     Readmission Risk Interventions     No data to display

## 2023-12-06 NOTE — Progress Notes (Signed)
 PROGRESS NOTE        PATIENT DETAILS Name: Lacey Vega Age: 73 y.o. Sex: female Date of Birth: 02/20/1951 Admit Date: 12/04/2023 Admitting Physician Maximino DELENA Sharps, MD ERE:Izjw, Camellia CROME, MD  Brief Summary: Patient is a 73 y.o.  female with history of prior GI bleeding secondary to AVMs-presented with recurrent GI bleeding and acute blood loss anemia.  Significant events: 7/4>> admit to [facility]  Significant studies: None  Significant microbiology data: None  Procedures: 7/5>> EGD: 3 mm bleeding AVM in the duodenum, 2 nonbleeding AVMs in jejunum.  Consults: GI  Subjective: Had fever last night-discussed with RN-apparently patient had chills and rigors.  Denies any cough/URI symptoms-denies nausea/vomiting or diarrhea.  No abdominal pain.  No headache.  Objective: Vitals: Blood pressure (!) 115/53, pulse 75, temperature 98.6 F (37 C), temperature source Oral, resp. rate 16, SpO2 93%.   Exam: Awake/alert Atraumatic normocephalic Neck is supple Chest: Clear to auscultation: CVS: S1-S2 regular Abdomen: Soft nontender nondistended Extremities: No edema Skin no rash Nonfocal exam  Pertinent Labs/Radiology:    Latest Ref Rng & Units 12/05/2023    6:10 PM 12/05/2023    6:35 AM 12/04/2023    2:34 PM  CBC  WBC 4.0 - 10.5 K/uL 6.6  5.0    Hemoglobin 12.0 - 15.0 g/dL 9.9  9.1  6.4   Hematocrit 36.0 - 46.0 % 30.6  28.2  20.7   Platelets 150 - 400 K/uL 226  209      Lab Results  Component Value Date   NA 142 12/05/2023   K 4.3 12/05/2023   CL 111 12/05/2023   CO2 24 12/05/2023      Assessment/Plan: Upper GI bleeding secondary to known AVM-with acute blood loss anemia. No further melena-Hb stable after 2 units of PRBC on 7/4 EGD with a total of 5 nonbleeding AVMs-s/p APC Continue PPI Follow CBC  Fever on 7/5 No foci of infection apparent Discussed with night RN-patient apparently was symptomatic with chills/rigors Check UA-blood  culture-CXR Observe off antibiotics-given clinical stability and lack of focal active  Atypical chest pain Resolved Probably GI related Troponins negative.  CKD stage IIIb Close to baseline  PAD AAA-s/p endovascular repair 2022 No longer on DAPT-on aspirin -currently on hold Continue statin  HLD Statin  Hypothyroidism Synthroid   GERD PPI  History of sickle cell trait  Class 1 Obesity: Estimated body mass index is 31.62 kg/m as calculated from the following:   Height as of 12/03/23: 5' 5 (1.651 m).   Weight as of 12/03/23: 86.2 kg.   Code status:   Code Status: Full Code   DVT Prophylaxis: SCDs Start: 12/04/23 1349   Family Communication: None at bedside   Disposition Plan: Status is: Inpatient Remains inpatient appropriate because: Severity of illness   Planned Discharge Destination:Home   Diet: Diet Order             Diet Heart Fluid consistency: Thin  Diet effective now                     Antimicrobial agents: Anti-infectives (From admission, onward)    None        MEDICATIONS: Scheduled Meds:  sodium chloride    Intravenous Once   atorvastatin   40 mg Oral QHS   donepezil   5 mg Oral QHS   fluticasone  furoate-vilanterol  1  puff Inhalation Daily   levothyroxine   125 mcg Oral Once per day on Monday Tuesday Wednesday Thursday Friday Saturday   lidocaine   1 patch Transdermal Q24H   montelukast   10 mg Oral Q1500   pantoprazole  (PROTONIX ) IV  40 mg Intravenous Q12H   Continuous Infusions: PRN Meds:.acetaminophen  **OR** acetaminophen , albuterol    I have personally reviewed following labs and imaging studies  LABORATORY DATA: CBC: Recent Labs  Lab 12/03/23 1608 12/04/23 1117 12/04/23 1434 12/05/23 0635 12/05/23 1810  WBC  --  4.5  --  5.0 6.6  HGB 7.5* 7.1* 6.4* 9.1* 9.9*  HCT 23.7* 23.0* 20.7* 28.2* 30.6*  MCV  --  85.2  --  82.9 83.8  PLT  --  140*  --  209 226    Basic Metabolic Panel: Recent Labs  Lab 12/04/23 1117  12/05/23 0635  NA 139 142  K 4.2 4.3  CL 108 111  CO2 24 24  GLUCOSE 103* 104*  BUN 22 13  CREATININE 1.42* 1.35*  CALCIUM  8.3* 8.9    GFR: Estimated Creatinine Clearance: 40.3 mL/min (A) (by C-G formula based on SCr of 1.35 mg/dL (H)).  Liver Function Tests: Recent Labs  Lab 12/04/23 1117  AST 18  ALT 9  ALKPHOS 89  BILITOT 0.7  PROT 6.4*  ALBUMIN  3.4*   No results for input(s): LIPASE, AMYLASE in the last 168 hours. No results for input(s): AMMONIA in the last 168 hours.  Coagulation Profile: No results for input(s): INR, PROTIME in the last 168 hours.  Cardiac Enzymes: No results for input(s): CKTOTAL, CKMB, CKMBINDEX, TROPONINI in the last 168 hours.  BNP (last 3 results) No results for input(s): PROBNP in the last 8760 hours.  Lipid Profile: No results for input(s): CHOL, HDL, LDLCALC, TRIG, CHOLHDL, LDLDIRECT in the last 72 hours.  Thyroid  Function Tests: No results for input(s): TSH, T4TOTAL, FREET4, T3FREE, THYROIDAB in the last 72 hours.  Anemia Panel: No results for input(s): VITAMINB12, FOLATE, FERRITIN, TIBC, IRON , RETICCTPCT in the last 72 hours.  Urine analysis:    Component Value Date/Time   COLORURINE YELLOW 03/27/2009 1801   APPEARANCEUR CLOUDY (A) 03/27/2009 1801   LABSPEC 1.003 (L) 03/27/2009 1801   PHURINE 6.5 03/27/2009 1801   GLUCOSEU NEGATIVE 03/27/2009 1801   HGBUR NEGATIVE 03/27/2009 1801   BILIRUBINUR NEGATIVE 03/27/2009 1801   KETONESUR NEGATIVE 03/27/2009 1801   PROTEINUR NEGATIVE 03/27/2009 1801   UROBILINOGEN 0.2 03/27/2009 1801   NITRITE NEGATIVE 03/27/2009 1801   LEUKOCYTESUR  03/27/2009 1801    NEGATIVE MICROSCOPIC NOT DONE ON URINES WITH NEGATIVE PROTEIN, BLOOD, LEUKOCYTES, NITRITE, OR GLUCOSE <1000 mg/dL.    Sepsis Labs: Lactic Acid, Venous No results found for: LATICACIDVEN  MICROBIOLOGY: No results found for this or any previous visit (from the past 240  hours).  RADIOLOGY STUDIES/RESULTS: No results found.    LOS: 2 days   Donalda Applebaum, MD  [facility]    To contact the attending provider between 7A-7P or the covering provider during after hours 7P-7A, please log into the web site [email or web address] and access using universal [place] password for that web site. If you do not have the password, please call the hospital operator.  12/06/2023, 9:46 AM

## 2023-12-07 ENCOUNTER — Other Ambulatory Visit (HOSPITAL_COMMUNITY): Payer: Self-pay

## 2023-12-07 DIAGNOSIS — E785 Hyperlipidemia, unspecified: Secondary | ICD-10-CM

## 2023-12-07 DIAGNOSIS — Z8719 Personal history of other diseases of the digestive system: Secondary | ICD-10-CM | POA: Diagnosis not present

## 2023-12-07 DIAGNOSIS — N1832 Chronic kidney disease, stage 3b: Secondary | ICD-10-CM | POA: Diagnosis not present

## 2023-12-07 DIAGNOSIS — K922 Gastrointestinal hemorrhage, unspecified: Secondary | ICD-10-CM | POA: Diagnosis not present

## 2023-12-07 LAB — CBC
HCT: 29.7 % — ABNORMAL LOW (ref 36.0–46.0)
Hemoglobin: 9.3 g/dL — ABNORMAL LOW (ref 12.0–15.0)
MCH: 26.3 pg (ref 26.0–34.0)
MCHC: 31.3 g/dL (ref 30.0–36.0)
MCV: 84.1 fL (ref 80.0–100.0)
Platelets: 239 K/uL (ref 150–400)
RBC: 3.53 MIL/uL — ABNORMAL LOW (ref 3.87–5.11)
RDW: 15.9 % — ABNORMAL HIGH (ref 11.5–15.5)
WBC: 9 K/uL (ref 4.0–10.5)
nRBC: 0 % (ref 0.0–0.2)

## 2023-12-07 LAB — PROCALCITONIN: Procalcitonin: 0.93 ng/mL

## 2023-12-07 MED ORDER — AMOXICILLIN-POT CLAVULANATE 500-125 MG PO TABS
1.0000 | ORAL_TABLET | Freq: Two times a day (BID) | ORAL | 0 refills | Status: AC
  Filled 2023-12-07: qty 8, 4d supply, fill #0

## 2023-12-07 MED ORDER — BENZONATATE 200 MG PO CAPS
200.0000 mg | ORAL_CAPSULE | Freq: Three times a day (TID) | ORAL | 0 refills | Status: DC | PRN
  Filled 2023-12-07: qty 20, 7d supply, fill #0

## 2023-12-07 NOTE — Progress Notes (Signed)
 Explained discharge instructions to patient. Reviewed follow up appointment and next medication administration times. Also reviewed education. Patient verbalized having an understanding for instructions given. All belongings are in the patient's possession. Will pick up TOC meds from pharmacy on route to the discharge lounge. The IV was removed by the patient's RN. No other needs verbalized. Will transport downstairs to the discharge lounge.

## 2023-12-07 NOTE — Discharge Summary (Signed)
 PATIENT DETAILS Name: Lacey Vega Age: 73 y.o. Sex: female Date of Birth: September 24, 1950 MRN: [id number]. Admitting Physician: Maximino DELENA Sharps, MD ERE:Izjw, Camellia CROME, MD  Admit Date: 12/04/2023 Discharge date: 12/07/2023  Recommendations for Outpatient Follow-up:  Follow up with PCP in 1-2 weeks Please obtain CMP/CBC in one week  Admitted From:  Home  Disposition: Home   Discharge Condition: good  CODE STATUS:   Code Status: Full Code   Diet recommendation:  Diet Order             Diet - low sodium heart healthy           Diet Heart Fluid consistency: Thin  Diet effective now                    Brief Summary: Patient is a 73 y.o.  female with history of prior GI bleeding secondary to AVMs-presented with recurrent GI bleeding and acute blood loss anemia.   Significant events: 7/4>> admit to [facility] 7/5>> EGD-5 AVM in stomach-nonbleeding.  Fever later in the evening 7/6>> CXR with PNA-Unasyn  started   Significant studies: 7/6>> CXR: Patchy left airspace disease.   Significant microbiology data: 7/6>> blood cultures: Negative   Procedures: 7/5>> EGD: 3 mm bleeding AVM in the duodenum, 2 nonbleeding AVMs in jejunum.   Consults: GI  Brief Hospital Course: Upper GI bleeding secondary to known AVM-with acute blood loss anemia. No further melena-Hb stable after 2 units of PRBC on 7/4 EGD with a total of 5 nonbleeding AVMs-s/p APC Continue PPI Follow CBC closely in the outpatient setting.   Fever on 7/5-likely due to PNA Had fever with chills/rigors on 7/5-in the evening post EGD Could have aspirated Started on Unasyn -no further fevers-feels better-white count has resolved Blood cultures negative so far Will transition to Augmentin  on discharge. PCP to repeat two-view chest x-ray in 4 to 6 weeks to document resolution of pneumonia.   Atypical chest pain Resolved Probably GI related Troponins negative.   CKD stage IIIb Close to baseline    PAD AAA-s/p endovascular repair 2022 No longer on DAPT-on aspirin -which was held-since no longer bleeding-suspect can be resumed. Continue statin   HLD Statin   Hypothyroidism Synthroid    GERD PPI   History of sickle cell trait   Class 1 Obesity: Estimated body mass index is 31.62 kg/m as calculated from the following:   Height as of 12/03/23: 5' 5 (1.651 m).   Weight as of 12/03/23: 86.2 kg.    Discharge Diagnoses:  Principal Problem:   History of angiodysplasia of intestinal tract Active Problems:   Upper GI bleed   Chest pain   Hypocalcemia   Thrombocytopenia (HCC)   Chronic kidney disease, stage 3b (HCC)   PAD (peripheral artery disease) (HCC)   HLD (hyperlipidemia)   Cognitive impairment   Hypothyroidism (acquired)   Sickle cell trait (HCC)   GERD (gastroesophageal reflux disease)   Obesity (BMI 30-39.9)   Discharge Instructions:  Activity:  As tolerated  Discharge Instructions     Call MD for:  difficulty breathing, headache or visual disturbances   Complete by: As directed    Diet - low sodium heart healthy   Complete by: As directed    Discharge instructions   Complete by: As directed    Follow with Primary MD  Lacey Camellia CROME, MD in 1-2 weeks  Please ask your primary MD to repeat two-view chest x-ray in 4 to 6 weeks to ensure resolution of pneumonia  Please ask your primary MD to follow-up blood cultures until final-negative at the time of discharge.  If you have were to have black stools/bloody stools/bloody vomitus/coffee-ground vomitus please seek immediate medical attention.  Please get a complete blood count and chemistry panel checked by your Primary MD at your next visit, and again as instructed by your Primary MD.  Get Medicines reviewed and adjusted: Please take all your medications with you for your next visit with your Primary MD  Laboratory/radiological data: Please request your Primary MD to go over all hospital tests and  procedure/radiological results at the follow up, please ask your Primary MD to get all Hospital records sent to his/her office.  In some cases, they will be blood work, cultures and biopsy results pending at the time of your discharge. Please request that your primary care M.D. follows up on these results.  Also Note the following: If you experience worsening of your admission symptoms, develop shortness of breath, life threatening emergency, suicidal or homicidal thoughts you must seek medical attention immediately by calling 911 or calling your MD immediately  if symptoms less severe.  You must read complete instructions/literature along with all the possible adverse reactions/side effects for all the Medicines you take and that have been prescribed to you. Take any new Medicines after you have completely understood and accpet all the possible adverse reactions/side effects.   Do not drive when taking Pain medications or sleeping medications (Benzodaizepines)  Do not take more than prescribed Pain, Sleep and Anxiety Medications. It is not advisable to combine anxiety,sleep and pain medications without talking with your primary care practitioner  Special Instructions: If you have smoked or chewed Tobacco  in the last 2 yrs please stop smoking, stop any regular Alcohol  and or any Recreational drug use.  Wear Seat belts while driving.  Please note: You were cared for by a hospitalist during your hospital stay. Once you are discharged, your primary care physician will handle any further medical issues. Please note that NO REFILLS for any discharge medications will be authorized once you are discharged, as it is imperative that you return to your primary care physician (or establish a relationship with a primary care physician if you do not have one) for your post hospital discharge needs so that they can reassess your need for medications and monitor your lab values.   Increase activity slowly    Complete by: As directed       Allergies as of 12/07/2023       Reactions   Oxycodone Shortness Of Breath        Medication List     TAKE these medications    acetaminophen  500 MG tablet Commonly known as: TYLENOL  Take 500-1,000 mg by mouth every 6 (six) hours as needed for mild pain or headache.   albuterol  108 (90 Base) MCG/ACT inhaler Commonly known as: VENTOLIN  HFA Inhale 1-2 puffs into the lungs every 6 (six) hours as needed for wheezing or shortness of breath.   amoxicillin -clavulanate 500-125 MG tablet Commonly known as: Augmentin  Take 1 tablet by mouth 2 (two) times daily for 4 days.   ascorbic acid 500 MG tablet Commonly known as: VITAMIN C Take 500 mg by mouth daily.   aspirin  EC 81 MG tablet Take 81 mg by mouth daily. Swallow whole.   atorvastatin  40 MG tablet Commonly known as: LIPITOR Take 40 mg by mouth at bedtime.   benzonatate  200 MG capsule Commonly known as: TESSALON  Take 1 capsule (200  mg total) by mouth 3 (three) times daily as needed for cough.   Biotin 2500 MCG Chew Chew 5,000 mcg by mouth daily in the afternoon.   Cinnamon 500 MG Tabs Take 1,000 mg by mouth every evening.   cyanocobalamin  1000 MCG tablet Commonly known as: VITAMIN B12 Take 1,000 mcg by mouth daily.   donepezil  5 MG tablet Commonly known as: ARICEPT  Take 5 mg by mouth at bedtime.   ferrous gluconate  324 MG tablet Commonly known as: FERGON Take 324 mg by mouth daily with breakfast.   Fish Oil Burp-Less 1200 MG Caps Take 1,200 mg by mouth daily in the afternoon.   folic acid  800 MCG tablet Commonly known as: FOLVITE  Take 800 mcg by mouth daily.   levalbuterol 0.63 MG/3ML nebulizer solution Commonly known as: XOPENEX Take 0.63 mg by nebulization every 6 (six) hours as needed for wheezing or shortness of breath.   levothyroxine  125 MCG tablet Commonly known as: SYNTHROID  Take 125 mcg by mouth See admin instructions. Take 125 mcg by mouth before breakfast on  Mon/Tues/Wed/Thurs/Fri/Sat (take nothing on Sundays)   Linzess  145 MCG Caps capsule Generic drug: linaclotide  TAKE 1 CAPSULE BY MOUTH DAILY BEFORE BREAKFAST.   montelukast  10 MG tablet Commonly known as: SINGULAIR  Take 10 mg by mouth daily in the afternoon.   nitroGLYCERIN  0.4 MG SL tablet Commonly known as: NITROSTAT  Place 0.4 mg under the tongue every 5 (five) minutes as needed for chest pain.   pantoprazole  40 MG tablet Commonly known as: PROTONIX  Take 1 tablet (40 mg total) by mouth daily before breakfast.   spironolactone 25 MG tablet Commonly known as: ALDACTONE Take 25 mg by mouth daily.   Wixela Inhub 250-50 MCG/ACT Aepb Generic drug: fluticasone -salmeterol Inhale 1 puff into the lungs in the morning and at bedtime.        Follow-up Information     Lacey Camellia CROME, MD. Schedule an appointment as soon as possible for a visit in 1 week(s).   Specialty: Internal Medicine Contact information: [address] [place] [address] [phone]                Allergies  Allergen Reactions   Oxycodone Shortness Of Breath     Other Procedures/Studies: DG Chest 2 View Result Date: 12/06/2023 CLINICAL DATA:  Short of breath, gastrointestinal bleeding EXAM: CHEST - 2 VIEW COMPARISON:  04/03/2023 FINDINGS: Frontal and lateral views of the chest demonstrate an unremarkable cardiac silhouette. There is patchy left lower lobe airspace disease. Trace left effusion. Right chest is clear. No pneumothorax. No acute bony abnormalities. IMPRESSION: 1. Patchy left lower lobe airspace disease consistent with infection or aspiration. 2. Trace left pleural effusion. Electronically Signed   By: Ozell Daring M.D.   On: 12/06/2023 12:59   MR LUMBAR SPINE WO CONTRAST Result Date: 12/03/2023 MR LUMBAR SPINE WITHOUT IV CONTRAST COMPARISON: None available CLINICAL HISTORY: None available TECHNIQUE: SAG T2, SAG T1, SAG STIR, AX T2, AX T1 without IV contrast. FINDINGS:  There is normal alignment of the lumbar spine. Mild multilevel disc desiccation and mild-to-moderate facet arthrosis. There is no vertebral body height loss, subluxation or marrow replacing process. The sacrum and SI joints are unremarkable so far as visualized. Conus and cauda equina are unremarkable. T12-L1: There is no focal disc protrusion, foraminal or spinal stenosis. L1-2: There is no focal disc protrusion, foraminal or spinal stenosis. Mild facet arthrosis. L2-3: Mild broad-based bulge slightly effacing the ventral thecal sac. Moderate facet arthrosis bilaterally. There is  caudal foraminal narrowing, left slightly greater than right. No impingement of the descending nerve roots or exiting nerves. L3-4: Mild disc desiccation moderate facet arthrosis. No significant foraminal or spinal stenosis. L4-5: Broad-based disc osteophyte moderate facet arthrosis. No significant foraminal or spinal stenosis. L5-S1: There is a transitional vertebral level on the left with fusion of the L5 vertebral body in the S1 vertebral body best seen on sagittal image 14 and 15. No significant foraminal or spinal stenosis. Mild facet arthrosis is There is a large infrarenal abdominal aortic aneurysm measuring 5.4 cm in AP dimension, 5.5 cm in transverse dimension and 10 cm in length. There is an endoluminal stent graft in place. Size of the native aneurysm is relatively stable when compared with the prior CTA. IMPRESSION: Mild degenerative disc desiccation facet arthrosis. No significant foraminal or spinal stenosis. Mild multilevel facet arthrosis most notably on the right at L3-4. No acute abnormality. Stable infrarenal abdominal aortic aneurysm with endoluminal stent graft in place. Electronically signed by: Norleen Satchel MD 12/03/2023 01:31 PM EDT RP Workstation: [id number]     TODAY-DAY OF DISCHARGE:  Subjective:   Lacey Vega today has no headache,no chest abdominal pain,no new weakness tingling or numbness, feels  much better wants to go home today.   Objective:   Blood pressure (!) 119/57, pulse 67, temperature 98.2 F (36.8 C), temperature source Oral, resp. rate 14, SpO2 98%.  Intake/Output Summary (Last 24 hours) at 12/07/2023 0912 Last data filed at 12/07/2023 0315 Gross per 24 hour  Intake 200 ml  Output --  Net 200 ml   There were no vitals filed for this visit.  Exam: Awake Alert, Oriented *3, No new F.N deficits, Normal affect [place].AT,PERRAL Supple Neck,No JVD, No cervical lymphadenopathy appriciated.  Symmetrical Chest wall movement, Good air movement bilaterally, CTAB RRR,No Gallops,Rubs or new Murmurs, No Parasternal Heave +ve B.Sounds, Abd Soft, Non tender, No organomegaly appriciated, No rebound -guarding or rigidity. No Cyanosis, Clubbing or edema, No new Rash or bruise   PERTINENT RADIOLOGIC STUDIES: DG Chest 2 View Result Date: 12/06/2023 CLINICAL DATA:  Short of breath, gastrointestinal bleeding EXAM: CHEST - 2 VIEW COMPARISON:  04/03/2023 FINDINGS: Frontal and lateral views of the chest demonstrate an unremarkable cardiac silhouette. There is patchy left lower lobe airspace disease. Trace left effusion. Right chest is clear. No pneumothorax. No acute bony abnormalities. IMPRESSION: 1. Patchy left lower lobe airspace disease consistent with infection or aspiration. 2. Trace left pleural effusion. Electronically Signed   By: Ozell Daring M.D.   On: 12/06/2023 12:59     PERTINENT LAB RESULTS: CBC: Recent Labs    12/06/23 1400 12/07/23 0521  WBC 15.3* 9.0  HGB 9.7* 9.3*  HCT 31.7* 29.7*  PLT 239 239   CMET CMP     Component Value Date/Time   NA 141 12/06/2023 1400   K 3.9 12/06/2023 1400   CL 111 12/06/2023 1400   CO2 21 (L) 12/06/2023 1400   GLUCOSE 83 12/06/2023 1400   BUN 14 12/06/2023 1400   CREATININE 1.61 (H) 12/06/2023 1400   CREATININE 1.29 (H) 11/16/2023 1054   CALCIUM  8.7 (L) 12/06/2023 1400   PROT 6.3 (L) 12/06/2023 1400   ALBUMIN  3.1 (L) 12/06/2023  1400   AST 17 12/06/2023 1400   AST 14 (L) 11/16/2023 1054   ALT 8 12/06/2023 1400   ALT 7 11/16/2023 1054   ALKPHOS 77 12/06/2023 1400   BILITOT 0.9 12/06/2023 1400   BILITOT 0.7 11/16/2023 1054   GFRNONAA 34 (L)  12/06/2023 1400   GFRNONAA 44 (L) 11/16/2023 1054    GFR Estimated Creatinine Clearance: 33.8 mL/min (A) (by C-G formula based on SCr of 1.61 mg/dL (H)). No results for input(s): LIPASE, AMYLASE in the last 72 hours. No results for input(s): CKTOTAL, CKMB, CKMBINDEX, TROPONINI in the last 72 hours. Invalid input(s): POCBNP No results for input(s): DDIMER in the last 72 hours. No results for input(s): HGBA1C in the last 72 hours. No results for input(s): CHOL, HDL, LDLCALC, TRIG, CHOLHDL, LDLDIRECT in the last 72 hours. No results for input(s): TSH, T4TOTAL, T3FREE, THYROIDAB in the last 72 hours.  Invalid input(s): FREET3 No results for input(s): VITAMINB12, FOLATE, FERRITIN, TIBC, IRON , RETICCTPCT in the last 72 hours. Coags: No results for input(s): INR in the last 72 hours.  Invalid input(s): PT Microbiology: Recent Results (from the past 240 hours)  Culture, blood (Routine X 2) w Reflex to ID Panel     Status: None (Preliminary result)   Collection Time: 12/06/23  8:04 AM   Specimen: BLOOD LEFT HAND  Result Value Ref Range Status   Specimen Description BLOOD LEFT HAND  Final   Special Requests   Final    BOTTLES DRAWN AEROBIC AND ANAEROBIC Blood Culture results may not be optimal due to an inadequate volume of blood received in culture bottles   Culture   Final    NO GROWTH < 24 HOURS Performed at [facility], [address], [place], [place] [address]    Report Status PENDING  Incomplete  Culture, blood (Routine X 2) w Reflex to ID Panel     Status: None (Preliminary result)   Collection Time: 12/06/23  8:09 AM   Specimen: BLOOD RIGHT HAND  Result Value Ref Range Status   Specimen Description  BLOOD RIGHT HAND  Final   Special Requests   Final    BOTTLES DRAWN AEROBIC AND ANAEROBIC Blood Culture results may not be optimal due to an inadequate volume of blood received in culture bottles   Culture   Final    NO GROWTH < 24 HOURS Performed at [facility], [address] [place], [place] [address]    Report Status PENDING  Incomplete    FURTHER DISCHARGE INSTRUCTIONS:  Get Medicines reviewed and adjusted: Please take all your medications with you for your next visit with your Primary MD  Laboratory/radiological data: Please request your Primary MD to go over all hospital tests and procedure/radiological results at the follow up, please ask your Primary MD to get all Hospital records sent to his/her office.  In some cases, they will be blood work, cultures and biopsy results pending at the time of your discharge. Please request that your primary care M.D. goes through all the records of your hospital data and follows up on these results.  Also Note the following: If you experience worsening of your admission symptoms, develop shortness of breath, life threatening emergency, suicidal or homicidal thoughts you must seek medical attention immediately by calling 911 or calling your MD immediately  if symptoms less severe.  You must read complete instructions/literature along with all the possible adverse reactions/side effects for all the Medicines you take and that have been prescribed to you. Take any new Medicines after you have completely understood and accpet all the possible adverse reactions/side effects.   Do not drive when taking Pain medications or sleeping medications (Benzodaizepines)  Do not take more than prescribed Pain, Sleep and Anxiety Medications. It is not advisable to  combine anxiety,sleep and pain medications without talking with your primary care practitioner  Special Instructions: If you have smoked or chewed Tobacco  in the last 2 yrs please stop  smoking, stop any regular Alcohol  and or any Recreational drug use.  Wear Seat belts while driving.  Please note: You were cared for by a hospitalist during your hospital stay. Once you are discharged, your primary care physician will handle any further medical issues. Please note that NO REFILLS for any discharge medications will be authorized once you are discharged, as it is imperative that you return to your primary care physician (or establish a relationship with a primary care physician if you do not have one) for your post hospital discharge needs so that they can reassess your need for medications and monitor your lab values.  Total Time spent coordinating discharge including counseling, education and face to face time equals greater than 30 minutes.  Signed: Sacramento Monds 12/07/2023 9:12 AM

## 2023-12-07 NOTE — Care Management Important Message (Signed)
 Important Message  Patient Details  Name: Lacey Vega MRN: [id number] Date of Birth: [birth year]   Important Message Given:    Patient left prior to IM delivery will mail to the patient home address.     Alias Villagran 12/07/2023, 4:16 PM

## 2023-12-07 NOTE — Plan of Care (Signed)

## 2023-12-10 ENCOUNTER — Encounter: Payer: Self-pay | Admitting: Gastroenterology

## 2023-12-10 DIAGNOSIS — D631 Anemia in chronic kidney disease: Secondary | ICD-10-CM | POA: Diagnosis not present

## 2023-12-10 DIAGNOSIS — E782 Mixed hyperlipidemia: Secondary | ICD-10-CM | POA: Diagnosis not present

## 2023-12-10 DIAGNOSIS — I119 Hypertensive heart disease without heart failure: Secondary | ICD-10-CM | POA: Diagnosis not present

## 2023-12-10 DIAGNOSIS — E039 Hypothyroidism, unspecified: Secondary | ICD-10-CM | POA: Diagnosis not present

## 2023-12-10 DIAGNOSIS — E119 Type 2 diabetes mellitus without complications: Secondary | ICD-10-CM | POA: Diagnosis not present

## 2023-12-11 LAB — CULTURE, BLOOD (ROUTINE X 2)
Culture: NO GROWTH
Culture: NO GROWTH

## 2023-12-14 ENCOUNTER — Inpatient Hospital Stay: Attending: Hematology and Oncology

## 2023-12-22 DIAGNOSIS — Z6831 Body mass index (BMI) 31.0-31.9, adult: Secondary | ICD-10-CM | POA: Diagnosis not present

## 2023-12-22 DIAGNOSIS — M5440 Lumbago with sciatica, unspecified side: Secondary | ICD-10-CM | POA: Diagnosis not present

## 2024-01-11 ENCOUNTER — Inpatient Hospital Stay: Attending: Hematology and Oncology

## 2024-01-11 DIAGNOSIS — D573 Sickle-cell trait: Secondary | ICD-10-CM | POA: Insufficient documentation

## 2024-01-11 DIAGNOSIS — D500 Iron deficiency anemia secondary to blood loss (chronic): Secondary | ICD-10-CM | POA: Insufficient documentation

## 2024-01-11 LAB — CBC WITH DIFFERENTIAL (CANCER CENTER ONLY)
Abs Immature Granulocytes: 0.02 K/uL (ref 0.00–0.07)
Basophils Absolute: 0.1 K/uL (ref 0.0–0.1)
Basophils Relative: 1 %
Eosinophils Absolute: 0.2 K/uL (ref 0.0–0.5)
Eosinophils Relative: 4 %
HCT: 27.2 % — ABNORMAL LOW (ref 36.0–46.0)
Hemoglobin: 8.5 g/dL — ABNORMAL LOW (ref 12.0–15.0)
Immature Granulocytes: 0 %
Lymphocytes Relative: 20 %
Lymphs Abs: 1.2 K/uL (ref 0.7–4.0)
MCH: 25.8 pg — ABNORMAL LOW (ref 26.0–34.0)
MCHC: 31.3 g/dL (ref 30.0–36.0)
MCV: 82.4 fL (ref 80.0–100.0)
Monocytes Absolute: 0.6 K/uL (ref 0.1–1.0)
Monocytes Relative: 10 %
Neutro Abs: 3.8 K/uL (ref 1.7–7.7)
Neutrophils Relative %: 65 %
Platelet Count: 244 K/uL (ref 150–400)
RBC: 3.3 MIL/uL — ABNORMAL LOW (ref 3.87–5.11)
RDW: 16.3 % — ABNORMAL HIGH (ref 11.5–15.5)
WBC Count: 5.8 K/uL (ref 4.0–10.5)
nRBC: 0 % (ref 0.0–0.2)

## 2024-01-11 LAB — CMP (CANCER CENTER ONLY)
ALT: 7 U/L (ref 0–44)
AST: 14 U/L — ABNORMAL LOW (ref 15–41)
Albumin: 4 g/dL (ref 3.5–5.0)
Alkaline Phosphatase: 113 U/L (ref 38–126)
Anion gap: 6 (ref 5–15)
BUN: 14 mg/dL (ref 8–23)
CO2: 26 mmol/L (ref 22–32)
Calcium: 8.7 mg/dL — ABNORMAL LOW (ref 8.9–10.3)
Chloride: 110 mmol/L (ref 98–111)
Creatinine: 1.33 mg/dL — ABNORMAL HIGH (ref 0.44–1.00)
GFR, Estimated: 42 mL/min — ABNORMAL LOW (ref >60)
Glucose, Bld: 100 mg/dL — ABNORMAL HIGH (ref 70–99)
Potassium: 4.2 mmol/L (ref 3.5–5.1)
Sodium: 142 mmol/L (ref 135–145)
Total Bilirubin: 0.8 mg/dL (ref 0.0–1.2)
Total Protein: 6.7 g/dL (ref 6.5–8.1)

## 2024-01-11 LAB — RETIC PANEL
Immature Retic Fract: 27 % — ABNORMAL HIGH (ref 2.3–15.9)
RBC.: 3.3 MIL/uL — ABNORMAL LOW (ref 3.87–5.11)
Retic Count, Absolute: 67.3 K/uL (ref 19.0–186.0)
Retic Ct Pct: 2 % (ref 0.4–3.1)
Reticulocyte Hemoglobin: 24 pg — ABNORMAL LOW (ref >27.9)

## 2024-01-11 LAB — IRON AND IRON BINDING CAPACITY (CC-WL,HP ONLY)
Iron: 32 ug/dL (ref 28–170)
Saturation Ratios: 8 % — ABNORMAL LOW (ref 10.4–31.8)
TIBC: 420 ug/dL (ref 250–450)
UIBC: 388 ug/dL (ref 148–442)

## 2024-01-11 LAB — FERRITIN: Ferritin: 34 ng/mL (ref 11–307)

## 2024-01-12 ENCOUNTER — Encounter: Payer: Self-pay | Admitting: Physical Therapy

## 2024-01-12 ENCOUNTER — Other Ambulatory Visit: Payer: Self-pay | Admitting: Internal Medicine

## 2024-01-12 ENCOUNTER — Other Ambulatory Visit: Payer: Self-pay

## 2024-01-12 ENCOUNTER — Ambulatory Visit
Admission: RE | Admit: 2024-01-12 | Discharge: 2024-01-12 | Disposition: A | Discharge status: Home / Self Care | Source: Ambulatory Visit | Attending: Internal Medicine | Admitting: Internal Medicine

## 2024-01-12 ENCOUNTER — Ambulatory Visit: Attending: Neurosurgery | Admitting: Physical Therapy

## 2024-01-12 DIAGNOSIS — Z8701 Personal history of pneumonia (recurrent): Secondary | ICD-10-CM

## 2024-01-12 DIAGNOSIS — J189 Pneumonia, unspecified organism: Secondary | ICD-10-CM | POA: Diagnosis not present

## 2024-01-12 DIAGNOSIS — M5459 Other low back pain: Secondary | ICD-10-CM | POA: Insufficient documentation

## 2024-01-12 DIAGNOSIS — J181 Lobar pneumonia, unspecified organism: Secondary | ICD-10-CM

## 2024-01-12 DIAGNOSIS — R29898 Other symptoms and signs involving the musculoskeletal system: Secondary | ICD-10-CM | POA: Diagnosis not present

## 2024-01-12 DIAGNOSIS — R918 Other nonspecific abnormal finding of lung field: Secondary | ICD-10-CM

## 2024-01-12 DIAGNOSIS — M6281 Muscle weakness (generalized): Secondary | ICD-10-CM | POA: Diagnosis not present

## 2024-01-12 NOTE — Therapy (Signed)
 OUTPATIENT PHYSICAL THERAPY THORACOLUMBAR EVALUATION   Patient Name: Lacey Vega MRN: [id number] DOB:05/11/1951, 73 y.o., female Today's Date: 01/12/2024  END OF SESSION:  PT End of Session - 01/12/24 1306     Visit Number 1    Number of Visits 21    Date for PT Re-Evaluation 03/22/24    Authorization Type [employer]    Authorization Time Period 01/12/24 to 03/22/24    Progress Note Due on Visit 10    PT Start Time 1148    PT Stop Time 1228    PT Time Calculation (min) 40 min    Activity Tolerance Patient tolerated treatment well;Patient limited by pain    Behavior During Therapy [facility] for tasks assessed/performed;Flat affect          Past Medical History:  Diagnosis Date   Anemia    Anxiety    Asthma    Asthma    Bronchitis    hx   Colon polyps    hyperplastic   Diabetes (HCC)    mild, history of   DJD (degenerative joint disease), cervical    Dysphagia    history of   Dyspnea    Gallstones    GERD (gastroesophageal reflux disease)    H/O chest pain    secondary to anemia   Headache    History of arteriovenous malformation (AVM)    History of blood transfusion 10/2021   History of hiatal hernia    History of tobacco use    Hyperlipemia    Hypertension    Hypothyroidism    Iron  deficiency anemia    Sickle cell anemia (HCC)    patient is not aware of this   Thyroid  disease    TIA (transient ischemic attack)    was told that she possible had one   Wears dentures    Wears glasses    Past Surgical History:  Procedure Laterality Date   ABDOMINAL AORTIC ENDOVASCULAR STENT GRAFT Bilateral 01/31/2021   Procedure: ENDOVASCULAR ANEURYSM REPAIR (EVAR)Bilateral Groin Cutdown, left femoral endaterectomy with bovine patch angioplasty.;  Surgeon: Magda Debby SAILOR, MD;  Location: [facility] OR;  Service: Vascular;  Laterality: Bilateral;   ABDOMINAL HYSTERECTOMY     ANTERIOR CERVICAL DECOMP/DISCECTOMY FUSION N/A 10/03/2016   Procedure: ANTERIOR CERVICAL  DECOMPRESSION/DISCECTOMY FUSION CERVICAL THREE- CERVICAL FOUR;  Surgeon: Gillie Duncans, MD;  Location: [facility] OR;  Service: Neurosurgery;  Laterality: N/A;  Right side approach   BOTOX  INJECTION  08/29/2015   Procedure: BOTOX  INJECTION;  Surgeon: Jerrell Sol, MD;  Location: [facility] ENDOSCOPY;  Service: Endoscopy;;   CHOLECYSTECTOMY     COLONOSCOPY  12/03/2011   Procedure: COLONOSCOPY;  Surgeon: Jerrell KYM Sol, MD;  Location: [facility] ENDOSCOPY;  Service: Endoscopy;  Laterality: N/A;   COLONOSCOPY N/A 11/27/2021   Procedure: COLONOSCOPY;  Surgeon: Federico Rosario BROCKS, MD;  Location: [facility];  Service: Gastroenterology;  Laterality: N/A;   ENTEROSCOPY N/A 07/07/2021   Procedure: ENTEROSCOPY;  Surgeon: Rosalie Kitchens, MD;  Location: [facility] ENDOSCOPY;  Service: Endoscopy;  Laterality: N/A;   ENTEROSCOPY N/A 08/06/2021   Procedure: ENTEROSCOPY;  Surgeon: Saintclair Jasper, MD;  Location: [facility] ENDOSCOPY;  Service: Gastroenterology;  Laterality: N/A;   ENTEROSCOPY N/A 11/27/2021   Procedure: SMALL  BOWEL ENTEROSCOPY;  Surgeon: Federico Rosario BROCKS, MD;  Location: [facility] ENDOSCOPY;  Service: Gastroenterology;  Laterality: N/A;   ENTEROSCOPY N/A 11/07/2022   Procedure: ENTEROSCOPY;  Surgeon: Charlanne Groom, MD;  Location: [facility] ENDOSCOPY;  Service: Gastroenterology;  Laterality: N/A;   ENTEROSCOPY N/A 04/03/2023  Procedure: ENTEROSCOPY;  Surgeon: Avram Lupita BRAVO, MD;  Location: [facility];  Service: Gastroenterology;  Laterality: N/A;   ENTEROSCOPY N/A 12/05/2023   Procedure: ENTEROSCOPY;  Surgeon: Federico Rosario BROCKS, MD;  Location: [facility] ENDOSCOPY;  Service: Gastroenterology;  Laterality: N/A;   ESOPHAGEAL MANOMETRY N/A 07/13/2017   Procedure: ESOPHAGEAL MANOMETRY (EM);  Surgeon: Dianna Specking, MD;  Location: [facility] ENDOSCOPY;  Service: Endoscopy;  Laterality: N/A;   ESOPHAGOGASTRODUODENOSCOPY  12/03/2011   Procedure: ESOPHAGOGASTRODUODENOSCOPY (EGD);  Surgeon: Specking KYM Dianna, MD;  Location: [facility];  Service: Endoscopy;  Laterality: N/A;    ESOPHAGOGASTRODUODENOSCOPY (EGD) WITH PROPOFOL  N/A 08/29/2015   Procedure: ESOPHAGOGASTRODUODENOSCOPY (EGD) WITH PROPOFOL ;  Surgeon: Specking Dianna, MD;  Location: [facility] ENDOSCOPY;  Service: Endoscopy;  Laterality: N/A;   ESOPHAGOGASTRODUODENOSCOPY (EGD) WITH PROPOFOL  N/A 05/17/2019   Procedure: ESOPHAGOGASTRODUODENOSCOPY (EGD) WITH PROPOFOL ;  Surgeon: Dianna Specking, MD;  Location: [facility] ENDOSCOPY;  Service: Endoscopy;  Laterality: N/A;   ESOPHAGOGASTRODUODENOSCOPY (EGD) WITH PROPOFOL  N/A 04/24/2021   Procedure: ESOPHAGOGASTRODUODENOSCOPY (EGD) WITH PROPOFOL ;  Surgeon: Elicia Claw, MD;  Location: [facility] ENDOSCOPY;  Service: Gastroenterology;  Laterality: N/A;   GIVENS CAPSULE STUDY N/A 08/05/2018   Procedure: GIVENS CAPSULE STUDY;  Surgeon: Dianna Specking, MD;  Location: [facility] ENDOSCOPY;  Service: Endoscopy;  Laterality: N/A;   GIVENS CAPSULE STUDY N/A 07/06/2021   Procedure: GIVENS CAPSULE STUDY;  Surgeon: Rosalie Kitchens, MD;  Location: [facility] ENDOSCOPY;  Service: Endoscopy;  Laterality: N/A;   HOT HEMOSTASIS  12/03/2011   Procedure: HOT HEMOSTASIS (ARGON PLASMA COAGULATION/BICAP);  Surgeon: Specking KYM Dianna, MD;  Location: [facility];  Service: Endoscopy;  Laterality: N/A;   HOT HEMOSTASIS N/A 07/07/2021   Procedure: HOT HEMOSTASIS (ARGON PLASMA COAGULATION/BICAP);  Surgeon: Rosalie Kitchens, MD;  Location: [facility];  Service: Endoscopy;  Laterality: N/A;   HOT HEMOSTASIS N/A 08/06/2021   Procedure: HOT HEMOSTASIS (ARGON PLASMA COAGULATION/BICAP);  Surgeon: Saintclair Jasper, MD;  Location: [facility];  Service: Gastroenterology;  Laterality: N/A;   HOT HEMOSTASIS  11/27/2021   Procedure: HOT HEMOSTASIS (ARGON PLASMA COAGULATION/BICAP);  Surgeon: Federico Rosario BROCKS, MD;  Location: [facility];  Service: Gastroenterology;;  EGD and COLON   HOT HEMOSTASIS N/A 11/07/2022   Procedure: HOT HEMOSTASIS (ARGON PLASMA COAGULATION/BICAP);  Surgeon: Charlanne Groom, MD;  Location: [facility];  Service: Gastroenterology;   Laterality: N/A;   HOT HEMOSTASIS N/A 04/03/2023   Procedure: HOT HEMOSTASIS (ARGON PLASMA COAGULATION/BICAP);  Surgeon: Avram Lupita BRAVO, MD;  Location: [facility];  Service: Gastroenterology;  Laterality: N/A;   HOT HEMOSTASIS N/A 12/05/2023   Procedure: EGD, WITH ARGON PLASMA COAGULATION;  Surgeon: Federico Rosario BROCKS, MD;  Location: [facility] ENDOSCOPY;  Service: Gastroenterology;  Laterality: N/A;   NECK SURGERY     PERIPHERAL VASCULAR INTERVENTION  03/29/2021   Procedure: PERIPHERAL VASCULAR INTERVENTION;  Surgeon: Magda Debby SAILOR, MD;  Location: [facility] INVASIVE CV LAB;  Service: Cardiovascular;;  Lt SFA Brachial Approach   POLYPECTOMY  11/27/2021   Procedure: POLYPECTOMY;  Surgeon: Federico Rosario BROCKS, MD;  Location: [facility];  Service: Gastroenterology;;   ROTATOR CUFF REPAIR     right   ROTATOR CUFF REPAIR Right 2014   THROMBECTOMY FEMORAL ARTERY Left 01/31/2021   Procedure: THROMBECTOMY POPLITEAL  ARTERY;  Surgeon: Magda Debby SAILOR, MD;  Location: [facility] OR;  Service: Vascular;  Laterality: Left;   TONSILLECTOMY     Patient Active Problem List   Diagnosis Date Noted   Upper GI bleed 12/04/2023   History of angiodysplasia of intestinal tract 12/04/2023   Chronic kidney disease, stage 3b (HCC) 12/04/2023  Chest pain 12/04/2023   Hypocalcemia 12/04/2023   Thrombocytopenia (HCC) 12/04/2023   Cognitive impairment 12/04/2023   GERD (gastroesophageal reflux disease) 12/04/2023   Obesity (BMI 30-39.9) 12/04/2023   Melena 04/02/2023   Symptomatic anemia 04/02/2023   Angiodysplasia of stomach and duodenum 04/02/2023   GI bleed 04/01/2023   Chronic kidney disease, stage 3a (HCC) 07/05/2021   Iron  deficiency anemia due to chronic blood loss 05/09/2021   Acute on chronic blood loss anemia 04/22/2021   AKI (acute kidney injury) (HCC) 04/22/2021   Epistaxis 04/22/2021   PAD (peripheral artery disease) (HCC) 04/22/2021   HLD (hyperlipidemia) 04/22/2021   Sickle cell trait (HCC) 04/22/2021   AAA (abdominal  aortic aneurysm) 02/01/2021   Aortic aneurysm (HCC) 01/31/2021   Achalasia 10/11/2017   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   DJD (degenerative joint disease), cervical    Iron  deficiency anemia    History of arteriovenous malformation (AVM)    History of tobacco use    Asthma    H/O chest pain    Diabetes (HCC)    Dysphagia    Iron  deficiency anemia, unspecified 12/03/2011   Hypothyroidism (acquired) 10/07/2010    Class: Chronic   DYSPHAGIA UNSPECIFIED 10/05/2009   ANEMIA 08/20/2009   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   DM 04/17/2009   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/05/1990    Class: Chronic    PCP: Addie Locus MD   REFERRING PROVIDER: Gillie Duncans, MD  REFERRING DIAG: Diagnosis M54.40 (ICD-10-CM) - Lumbago with sciatica, unspecified side  Rationale for Evaluation and Treatment: Rehabilitation  THERAPY DIAG:  Other low back pain  Muscle weakness (generalized)  Other symptoms and signs involving the musculoskeletal system  ONSET DATE: MD order 12/23/23  SUBJECTIVE:                                                                                                                                                                                           SUBJECTIVE STATEMENT:  Not sure what's happening with my back, it just hurts. When my blood gets low in the past my back hurt along with hit, when I got blood my back felt better. This time the pain stayed, its the lower part of my back. I'm OK as long as I'm sitting down but when I get up and move around it bothers me. Leaning forward takes pressure off my back.   PERTINENT HISTORY:  See above   PAIN:  Are you having pain? No 0/10 seated rest; at worst can be past 10/10  PRECAUTIONS: None  RED FLAGS: None   WEIGHT BEARING RESTRICTIONS: No  FALLS:  Has patient fallen in last 6 months? 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  DIAGNOSTIC FINDINGS:   Narrative & Impression  MR LUMBAR SPINE WITHOUT IV CONTRAST   COMPARISON: None available   CLINICAL HISTORY: None available   TECHNIQUE: SAG T2, SAG T1, SAG STIR, AX T2, AX T1 without IV contrast.   FINDINGS: There is normal alignment of the lumbar spine. Mild multilevel disc desiccation and mild-to-moderate facet arthrosis. There is no vertebral body height loss, subluxation or marrow replacing process. The sacrum and SI joints are unremarkable so far as visualized. Conus and cauda equina are unremarkable.   T12-L1: There is no focal disc protrusion, foraminal or spinal stenosis.   L1-2: There is no focal disc protrusion, foraminal or spinal stenosis. Mild facet arthrosis.   L2-3: Mild broad-based bulge slightly effacing the ventral thecal sac. Moderate facet arthrosis bilaterally. There is caudal foraminal narrowing, left slightly greater than right. No impingement of the descending nerve roots or exiting nerves.   L3-4: Mild disc desiccation moderate facet arthrosis. No significant foraminal or spinal stenosis.   L4-5: Broad-based disc osteophyte moderate facet arthrosis. No significant foraminal or spinal stenosis.   L5-S1: There is a transitional vertebral level on the left with fusion of the L5 vertebral body in the S1 vertebral body best seen on sagittal image 14 and 15. No significant foraminal or spinal stenosis. Mild facet arthrosis is   There is a large infrarenal abdominal  aortic aneurysm measuring 5.4 cm in AP dimension, 5.5 cm in transverse dimension and 10 cm in length. There is an endoluminal stent graft in place. Size of the native aneurysm is relatively stable when compared with the prior CTA.   IMPRESSION: Mild degenerative disc desiccation facet arthrosis. No significant foraminal or spinal stenosis. Mild multilevel facet arthrosis most notably on the right at L3-4.    PATIENT SURVEYS:  PSFS: THE PATIENT SPECIFIC FUNCTIONAL SCALE  Place score of 0-10 (0 = unable to perform activity and 10 = able to perform activity at the same level as before injury or problem)  Activity Date: 01/12/24    Standing up for long periods  0    2. Walking for long periods  0    3.     4.      Total Score 0      Total Score = Sum of activity scores/number of activities  Minimally Detectable Change: 3 points (for single activity); 2 points (for average score)  [facility] (nd). The Patient Specific Functional Scale . Retrieved from [email or web address]   COGNITION: Overall cognitive status: Within functional limits for tasks assessed     SENSATION: [facility] Not tested  MUSCLE LENGTH:  Unable to assess, pt resisted passive movements for mm flexibility checks   POSTURE: rounded shoulders, forward head, increased lumbar lordosis, and decreased thoracic kyphosis   LUMBAR ROM:   AROM eval  Flexion 50% limited   Extension 75% limited   Right lateral flexion 75% limited   Left lateral flexion 75% limited  Right rotation 50% limited   Left rotation 50% limited    (Blank rows = not tested)    LOWER EXTREMITY MMT:    MMT Right eval Left eval  Hip flexion 3- 3-  Hip extension    Hip abduction    Hip adduction    Hip internal rotation    Hip external rotation    Knee flexion 4 4  Knee extension 4 4  Ankle dorsiflexion    Ankle plantarflexion    Ankle inversion    Ankle eversion      (Blank rows = not tested)    TREATMENT DATE:   01/12/24  Eval, POC, HEP practice and education as below  Nustep L1x6 minutes seat 9 all four extremities                                                                                                                                    PATIENT EDUCATION:  Education details: exam findings, POC, HEP  Person educated: Patient Education method: Programmer, multimedia, Demonstration, and Handouts Education comprehension: verbalized understanding, returned demonstration, and needs further education  HOME EXERCISE PROGRAM:  Access Code: [email or web address] URL: [email or web address] Date: 01/12/2024 Prepared by: Josette Rough  Exercises - Seated Flexion Stretch  - 2 x daily - 7 x weekly - 1 sets - 10 reps - 3 seconds  hold - Seated Sidebending  - 2 x daily - 7 x weekly - 1 sets - 10 reps - 3 seconds  hold - Seated Trunk Rotation - Arms Crossed  - 2 x daily - 7 x weekly - 3 sets - 10 reps - 3 seconds  hold  ASSESSMENT:  CLINICAL IMPRESSION: Patient is a 73 y.o. F who was seen today for physical therapy evaluation and treatment for Diagnosis M54.40 (ICD-10-CM) - Lumbago with sciatica, unspecified side. Has a lot of joint stiffness, limitations in mm flexibility, and severe functional mm weakness. Will benefit from skilled PT services to address all findings and assist in return to desired level of function.    OBJECTIVE IMPAIRMENTS: Abnormal gait, decreased activity tolerance, decreased mobility, difficulty walking, decreased ROM, decreased strength, hypomobility, increased fascial restrictions, increased muscle spasms, impaired flexibility, obesity, and pain.   ACTIVITY LIMITATIONS: carrying, lifting, standing, squatting, stairs, transfers, hygiene/grooming, and locomotion level  PARTICIPATION LIMITATIONS: meal prep, cleaning, laundry, driving, shopping, community activity, and yard work  PERSONAL FACTORS: Age, Behavior pattern,  Education, Fitness, and Past/current experiences are also affecting patient's functional outcome.   REHAB POTENTIAL: Fair chronicity of pain, low health literacy   CLINICAL DECISION MAKING: Stable/uncomplicated  EVALUATION COMPLEXITY: Low   GOALS: Goals reviewed with patient? No  SHORT TERM GOALS: Target date: 02/16/2024    Will be compliant with appropriate progressive HEP  Baseline: Goal status: INITIAL  2.  Lumbar AROM to be no more than 25% limited all planes  Baseline:  Goal status: INITIAL  3.  Back pain to be no  more than 7/10 at worst  Baseline:  Goal status: INITIAL  4.  Will demonstrate improved awareness of functional posture  Baseline:  Goal status: INITIAL    LONG TERM GOALS: Target date: 03/22/2024    MMT to have improved by at least 1 grade all weak groups  Baseline:  Goal status: INITIAL  2.  Will be able to stand and cook/perform house work as desired for at least 45 minutes before back pain increases  Baseline:  Goal status: INITIAL  3.  Will be able to complete a grocery shopping trip of 30-45 minutes in duration before back pain increases  Baseline:  Goal status: INITIAL  4.  Back pain to be no more than 5/10 at worst  Baseline:  Goal status: INITIAL  5.  PSFS to have improved by at least 2 points  Baseline:  Goal status: INITIAL    PLAN:  PT FREQUENCY: 2x/week  PT DURATION: 10 weeks  PLANNED INTERVENTIONS: 97750- Physical Performance Testing, 97110-Therapeutic exercises, 97530- Therapeutic activity, [id number]- Neuromuscular re-education, 97535- Self Care, [id number]- Manual therapy, [id number]- Gait training, and [id number]- Aquatic Therapy.  PLAN FOR NEXT SESSION: gently get her moving as much as possible, could consider transfer to water PT if sx do not improve. Can't tolerate laying supine  Josette Rough, PT, DPT 01/12/24 1:07 PM

## 2024-01-14 ENCOUNTER — Ambulatory Visit: Admitting: Physical Therapy

## 2024-01-14 ENCOUNTER — Encounter: Payer: Self-pay | Admitting: Physical Therapy

## 2024-01-14 DIAGNOSIS — R29898 Other symptoms and signs involving the musculoskeletal system: Secondary | ICD-10-CM | POA: Diagnosis not present

## 2024-01-14 DIAGNOSIS — M5459 Other low back pain: Secondary | ICD-10-CM

## 2024-01-14 DIAGNOSIS — M6281 Muscle weakness (generalized): Secondary | ICD-10-CM

## 2024-01-14 NOTE — Therapy (Signed)
 OUTPATIENT PHYSICAL THERAPY THORACOLUMBAR EVALUATION   Patient Name: Lacey Vega MRN: [id number] DOB:1950-07-31, 73 y.o., female Today's Date: 01/14/2024  END OF SESSION:  PT End of Session - 01/14/24 0757     Visit Number 2    Date for PT Re-Evaluation 03/22/24    PT Start Time 0800    PT Stop Time 0845    PT Time Calculation (min) 45 min    Activity Tolerance Patient tolerated treatment well;Patient limited by pain    Behavior During Therapy [facility] for tasks assessed/performed;Flat affect          Past Medical History:  Diagnosis Date   Anemia    Anxiety    Asthma    Asthma    Bronchitis    hx   Colon polyps    hyperplastic   Diabetes (HCC)    mild, history of   DJD (degenerative joint disease), cervical    Dysphagia    history of   Dyspnea    Gallstones    GERD (gastroesophageal reflux disease)    H/O chest pain    secondary to anemia   Headache    History of arteriovenous malformation (AVM)    History of blood transfusion 10/2021   History of hiatal hernia    History of tobacco use    Hyperlipemia    Hypertension    Hypothyroidism    Iron  deficiency anemia    Sickle cell anemia (HCC)    patient is not aware of this   Thyroid  disease    TIA (transient ischemic attack)    was told that she possible had one   Wears dentures    Wears glasses    Past Surgical History:  Procedure Laterality Date   ABDOMINAL AORTIC ENDOVASCULAR STENT GRAFT Bilateral 01/31/2021   Procedure: ENDOVASCULAR ANEURYSM REPAIR (EVAR)Bilateral Groin Cutdown, left femoral endaterectomy with bovine patch angioplasty.;  Surgeon: Magda Debby SAILOR, MD;  Location: [facility] OR;  Service: Vascular;  Laterality: Bilateral;   ABDOMINAL HYSTERECTOMY     ANTERIOR CERVICAL DECOMP/DISCECTOMY FUSION N/A 10/03/2016   Procedure: ANTERIOR CERVICAL DECOMPRESSION/DISCECTOMY FUSION CERVICAL THREE- CERVICAL FOUR;  Surgeon: Gillie Duncans, MD;  Location: [facility] OR;  Service: Neurosurgery;  Laterality: N/A;   Right side approach   BOTOX  INJECTION  08/29/2015   Procedure: BOTOX  INJECTION;  Surgeon: Jerrell Sol, MD;  Location: [facility] ENDOSCOPY;  Service: Endoscopy;;   CHOLECYSTECTOMY     COLONOSCOPY  12/03/2011   Procedure: COLONOSCOPY;  Surgeon: Jerrell KYM Sol, MD;  Location: [facility] ENDOSCOPY;  Service: Endoscopy;  Laterality: N/A;   COLONOSCOPY N/A 11/27/2021   Procedure: COLONOSCOPY;  Surgeon: Federico Rosario BROCKS, MD;  Location: [facility];  Service: Gastroenterology;  Laterality: N/A;   ENTEROSCOPY N/A 07/07/2021   Procedure: ENTEROSCOPY;  Surgeon: Rosalie Kitchens, MD;  Location: [facility] END[facility];  Service: Endoscopy;  Laterality: N/A;   ENTEROSCOPY N/A 08/06/2021   Procedure: ENTEROSCOPY;  Surgeon: Saintclair Jasper, MD;  Location: [facility];  Service: Gastroenterology;  Laterality: N/A;   ENTEROSCOPY N/A 11/27/2021   Procedure: SMALL  BOWEL ENTEROSCOPY;  Surgeon: Federico Rosario BROCKS, MD;  Location: [facility];  Service: Gastroenterology;  Laterality: N/A;   ENTEROSCOPY N/A 11/07/2022   Procedure: ENTEROSCOPY;  Surgeon: Charlanne Groom, MD;  Location: [facility];  Service: Gastroenterology;  Laterality: N/A;   ENTEROSCOPY N/A 04/03/2023   Procedure: ENTEROSCOPY;  Surgeon: Avram Lupita BRAVO, MD;  Location: [facility];  Service: Gastroenterology;  Laterality: N/A;   ENTEROSCOPY N/A 12/05/2023   Procedure: ENTEROSCOPY;  Surgeon: Federico Rosario  C, MD;  Location: [facility];  Service: Gastroenterology;  Laterality: N/A;   ESOPHAGEAL MANOMETRY N/A 07/13/2017   Procedure: ESOPHAGEAL MANOMETRY (EM);  Surgeon: Dianna Specking, MD;  Location: [facility];  Service: Endoscopy;  Laterality: N/A;   ESOPHAGOGASTRODUODENOSCOPY  12/03/2011   Procedure: ESOPHAGOGASTRODUODENOSCOPY (EGD);  Surgeon: Specking KYM Dianna, MD;  Location: [facility];  Service: Endoscopy;  Laterality: N/A;   ESOPHAGOGASTRODUODENOSCOPY (EGD) WITH PROPOFOL  N/A 08/29/2015   Procedure: ESOPHAGOGASTRODUODENOSCOPY (EGD) WITH PROPOFOL ;  Surgeon: Specking Dianna, MD;   Location: [facility] ENDOSCOPY;  Service: Endoscopy;  Laterality: N/A;   ESOPHAGOGASTRODUODENOSCOPY (EGD) WITH PROPOFOL  N/A 05/17/2019   Procedure: ESOPHAGOGASTRODUODENOSCOPY (EGD) WITH PROPOFOL ;  Surgeon: Dianna Specking, MD;  Location: [facility];  Service: Endoscopy;  Laterality: N/A;   ESOPHAGOGASTRODUODENOSCOPY (EGD) WITH PROPOFOL  N/A 04/24/2021   Procedure: ESOPHAGOGASTRODUODENOSCOPY (EGD) WITH PROPOFOL ;  Surgeon: Elicia Claw, MD;  Location: [facility];  Service: Gastroenterology;  Laterality: N/A;   GIVENS CAPSULE STUDY N/A 08/05/2018   Procedure: GIVENS CAPSULE STUDY;  Surgeon: Dianna Specking, MD;  Location: [facility] ENDOSCOPY;  Service: Endoscopy;  Laterality: N/A;   GIVENS CAPSULE STUDY N/A 07/06/2021   Procedure: GIVENS CAPSULE STUDY;  Surgeon: Rosalie Kitchens, MD;  Location: [facility];  Service: Endoscopy;  Laterality: N/A;   HOT HEMOSTASIS  12/03/2011   Procedure: HOT HEMOSTASIS (ARGON PLASMA COAGULATION/BICAP);  Surgeon: Specking KYM Dianna, MD;  Location: [facility];  Service: Endoscopy;  Laterality: N/A;   HOT HEMOSTASIS N/A 07/07/2021   Procedure: HOT HEMOSTASIS (ARGON PLASMA COAGULATION/BICAP);  Surgeon: Rosalie Kitchens, MD;  Location: [facility];  Service: Endoscopy;  Laterality: N/A;   HOT HEMOSTASIS N/A 08/06/2021   Procedure: HOT HEMOSTASIS (ARGON PLASMA COAGULATION/BICAP);  Surgeon: Saintclair Jasper, MD;  Location: [facility];  Service: Gastroenterology;  Laterality: N/A;   HOT HEMOSTASIS  11/27/2021   Procedure: HOT HEMOSTASIS (ARGON PLASMA COAGULATION/BICAP);  Surgeon: Federico Rosario BROCKS, MD;  Location: [facility];  Service: Gastroenterology;;  EGD and COLON   HOT HEMOSTASIS N/A 11/07/2022   Procedure: HOT HEMOSTASIS (ARGON PLASMA COAGULATION/BICAP);  Surgeon: Charlanne Groom, MD;  Location: [facility];  Service: Gastroenterology;  Laterality: N/A;   HOT HEMOSTASIS N/A 04/03/2023   Procedure: HOT HEMOSTASIS (ARGON PLASMA COAGULATION/BICAP);  Surgeon: Avram Lupita BRAVO, MD;  Location: [facility];  Service: Gastroenterology;  Laterality: N/A;   HOT HEMOSTASIS N/A 12/05/2023   Procedure: EGD, WITH ARGON PLASMA COAGULATION;  Surgeon: Federico Rosario BROCKS, MD;  Location: [facility] ENDOSCOPY;  Service: Gastroenterology;  Laterality: N/A;   NECK SURGERY     PERIPHERAL VASCULAR INTERVENTION  03/29/2021   Procedure: PERIPHERAL VASCULAR INTERVENTION;  Surgeon: Magda Debby SAILOR, MD;  Location: [facility] INVASIVE CV [facility];  Service: Cardiovascular;;  Lt SFA Brachial Approach   POLYPECTOMY  11/27/2021   Procedure: POLYPECTOMY;  Surgeon: Federico Rosario BROCKS, MD;  Location: [facility];  Service: Gastroenterology;;   ROTATOR CUFF REPAIR     right   ROTATOR CUFF REPAIR Right 2014   THROMBECTOMY FEMORAL ARTERY Left 01/31/2021   Procedure: THROMBECTOMY POPLITEAL  ARTERY;  Surgeon: Magda Debby SAILOR, MD;  Location: [facility] OR;  Service: Vascular;  Laterality: Left;   TONSILLECTOMY     Patient Active Problem List   Diagnosis Date Noted   Upper GI bleed 12/04/2023   History of angiodysplasia of intestinal tract 12/04/2023   Chronic kidney disease, stage 3b (HCC) 12/04/2023   Chest pain 12/04/2023   Hypocalcemia 12/04/2023   Thrombocytopenia (HCC) 12/04/2023   Cognitive impairment 12/04/2023   GERD (gastroesophageal reflux disease) 12/04/2023   Obesity (BMI 30-39.9)  12/04/2023   Melena 04/02/2023   Symptomatic anemia 04/02/2023   Angiodysplasia of stomach and duodenum 04/02/2023   GI bleed 04/01/2023   Chronic kidney disease, stage 3a (HCC) 07/05/2021   Iron  deficiency anemia due to chronic blood loss 05/09/2021   Acute on chronic blood loss anemia 04/22/2021   AKI (acute kidney injury) (HCC) 04/22/2021   Epistaxis 04/22/2021   PAD (peripheral artery disease) (HCC) 04/22/2021   HLD (hyperlipidemia) 04/22/2021   Sickle cell trait (HCC) 04/22/2021   AAA (abdominal aortic aneurysm) 02/01/2021   Aortic aneurysm (HCC) 01/31/2021   Achalasia 10/11/2017   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   DJD  (degenerative joint disease), cervical    Iron  deficiency anemia    History of arteriovenous malformation (AVM)    History of tobacco use    Asthma    H/O chest pain    Diabetes (HCC)    Dysphagia    Iron  deficiency anemia, unspecified 12/03/2011   Hypothyroidism (acquired) 10/07/2010    Class: Chronic   DYSPHAGIA UNSPECIFIED 10/05/2009   ANEMIA 08/20/2009   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   DM 04/17/2009   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/05/1990    Class: Chronic    PCP: Addie Locus MD   REFERRING PROVIDER: Gillie Duncans, MD  REFERRING DIAG: Diagnosis M54.40 (ICD-10-CM) - Lumbago with sciatica, unspecified side  Rationale for Evaluation and Treatment: Rehabilitation  THERAPY DIAG:  Other low back pain  Muscle weakness (generalized)  Other symptoms and signs involving the musculoskeletal system  ONSET DATE: MD order 12/23/23  SUBJECTIVE:                                                                                                                                                                                           SUBJECTIVE STATEMENT:   Im fine  Not sure what's happening with my back, it just hurts. When my blood gets low in the past my back hurt along with hit, when I got blood my back felt better. This time the pain stayed, its the lower part of my back. I'm OK as long as I'm sitting down but when I get up and move around it bothers me. Leaning forward takes pressure off my back.   PERTINENT HISTORY:  See above   PAIN:  Are you having pain? No 0/10 seated rest; at worst can be past 10/10  PRECAUTIONS: None  RED FLAGS: None   WEIGHT BEARING RESTRICTIONS: No  FALLS:  Has patient fallen in last 6 months? 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  DIAGNOSTIC FINDINGS:   Narrative & Impression  MR LUMBAR SPINE WITHOUT IV CONTRAST   COMPARISON: None available   CLINICAL HISTORY: None available   TECHNIQUE: SAG T2, SAG T1, SAG STIR, AX T2, AX T1 without IV contrast.   FINDINGS: There is normal alignment of the lumbar spine. Mild multilevel disc desiccation and mild-to-moderate facet arthrosis. There is no vertebral body height loss, subluxation or marrow replacing process. The sacrum and SI joints are unremarkable so far as visualized. Conus and cauda equina are unremarkable.   T12-L1: There is no focal disc protrusion, foraminal or spinal stenosis.   L1-2: There is no focal disc protrusion, foraminal or spinal stenosis. Mild facet arthrosis.   L2-3: Mild broad-based bulge slightly effacing the ventral thecal sac. Moderate facet arthrosis bilaterally. There is caudal foraminal narrowing, left slightly greater than right. No impingement of the descending nerve roots or exiting nerves.   L3-4: Mild disc desiccation moderate facet arthrosis. No significant foraminal or spinal stenosis.   L4-5: Broad-based disc osteophyte moderate facet arthrosis. No significant foraminal or spinal stenosis.   L5-S1: There is a transitional vertebral level on the left with fusion of the L5 vertebral body in the S1 vertebral body best seen on sagittal image 14 and 15. No significant foraminal or spinal stenosis. Mild facet arthrosis is   There is a large infrarenal abdominal aortic aneurysm measuring 5.4 cm in AP dimension, 5.5 cm in transverse dimension and 10 cm in length. There is an endoluminal stent graft in place. Size of  the native aneurysm is relatively stable when compared with the prior CTA.   IMPRESSION: Mild degenerative disc desiccation facet arthrosis. No significant foraminal or spinal stenosis. Mild multilevel facet arthrosis most notably on the right at L3-4.    PATIENT SURVEYS:  PSFS: THE PATIENT SPECIFIC FUNCTIONAL SCALE  Place score of 0-10 (0 = unable to perform activity and 10 = able to perform activity at the same level as before injury or problem)  Activity Date: 01/12/24    Standing up for long periods  0    2. Walking for long periods  0    3.     4.      Total Score 0      Total Score = Sum of activity scores/number of activities  Minimally Detectable Change: 3 points (for single activity); 2 points (for average score)  [facility] (nd). The Patient Specific Functional Scale . Retrieved from [email or web address]   COGNITION: Overall cognitive status: Within functional limits for tasks assessed     SENSATION: WFL Not tested  MUSCLE LENGTH:  Unable to assess, pt resisted passive movements for mm flexibility checks   POSTURE: rounded shoulders, forward head, increased lumbar lordosis, and decreased thoracic kyphosis   LUMBAR ROM:   AROM eval  Flexion 50% limited   Extension 75% limited   Right lateral flexion 75% limited   Left lateral flexion 75% limited   Right rotation 50% limited   Left rotation 50% limited    (Blank rows = not tested)    LOWER EXTREMITY MMT:  MMT Right eval Left eval  Hip flexion 3- 3-  Hip extension    Hip abduction    Hip adduction    Hip internal rotation    Hip external rotation    Knee flexion 4 4  Knee extension 4 4  Ankle dorsiflexion    Ankle plantarflexion    Ankle inversion    Ankle eversion     (Blank rows = not tested)    TREATMENT DATE:  01/14/24 Shoulder AAROM 1lb WaTE Flex, Ext, IR x10 Education on diagnosis HS curls red 2x10 LAQ 2lb  2x10 Seated Rows red 2x10 Seated March 2x10  Hip add ball squeeze2x10 Hip abd green 2x10 Shoulder Ext red 2x10  NuStep L4 x 4 min  01/12/24  Eval, POC, HEP practice and education as below  Nustep L1x6 minutes seat 9 all four extremities                                                                                                                                    PATIENT EDUCATION:  Education details: exam findings, POC, HEP  Person educated: Patient Education method: Programmer, multimedia, Demonstration, and Handouts Education comprehension: verbalized understanding, returned demonstration, and needs further education  HOME EXERCISE PROGRAM:  Access Code: [email or web address] URL: [email or web address] Date: 01/12/2024 Prepared by: Josette Rough  Exercises - Seated Flexion Stretch  - 2 x daily - 7 x weekly - 1 sets - 10 reps - 3 seconds  hold - Seated Sidebending  - 2 x daily - 7 x weekly - 1 sets - 10 reps - 3 seconds  hold - Seated Trunk Rotation - Arms Crossed  - 2 x daily - 7 x weekly - 3 sets - 10 reps - 3 seconds  hold  ASSESSMENT:  CLINICAL IMPRESSION: Patient is a 73 y.o. F who was seen today for physical therapy evaluation and treatment for Diagnosis M54.40 (ICD-10-CM) - Lumbago with sciatica, unspecified side. She enters feeling well with no pain. Pt stated she starts to have pain after getting up and moving around, so cautioned during session utilizing light resistance. Postural cues needed with seated rows and standing shoulder extensions. No reports of increase pain during session, All interventions completed well Will benefit from skilled PT services to address all findings and assist in return to desired level of function.    OBJECTIVE IMPAIRMENTS: Abnormal gait, decreased activity tolerance, decreased mobility, difficulty walking, decreased ROM, decreased strength, hypomobility, increased fascial restrictions, increased muscle spasms, impaired flexibility, obesity,  and pain.   ACTIVITY LIMITATIONS: carrying, lifting, standing, squatting, stairs, transfers, hygiene/grooming, and locomotion level  PARTICIPATION LIMITATIONS: meal prep, cleaning, laundry, driving, shopping, community activity, and yard work  PERSONAL FACTORS: Age, Behavior pattern, Education, Fitness, and Past/current experiences are also affecting patient's functional outcome.   REHAB POTENTIAL: Fair chronicity of pain, low health literacy   CLINICAL DECISION MAKING: Stable/uncomplicated  EVALUATION COMPLEXITY: Low   GOALS: Goals reviewed with patient?  No  SHORT TERM GOALS: Target date: 02/16/2024    Will be compliant with appropriate progressive HEP  Baseline:// Goal status: Ongoing   2.  Lumbar AROM to be no more than 25% limited all planes  Baseline:  Goal status: INITIAL  3.  Back pain to be no more than 7/10 at worst  Baseline:  Goal status: INITIAL  4.  Will demonstrate improved awareness of functional posture  Baseline:  Goal status: INITIAL    LONG TERM GOALS: Target date: 03/22/2024    MMT to have improved by at least 1 grade all weak groups  Baseline:  Goal status: INITIAL  2.  Will be able to stand and cook/perform house work as desired for at least 45 minutes before back pain increases  Baseline:  Goal status: INITIAL  3.  Will be able to complete a grocery shopping trip of 30-45 minutes in duration before back pain increases  Baseline:  Goal status: INITIAL  4.  Back pain to be no more than 5/10 at worst  Baseline:  Goal status: INITIAL  5.  PSFS to have improved by at least 2 points  Baseline:  Goal status: INITIAL    PLAN:  PT FREQUENCY: 2x/week  PT DURATION: 10 weeks  PLANNED INTERVENTIONS: 97750- Physical Performance Testing, 97110-Therapeutic exercises, 97530- Therapeutic activity, [id number]- Neuromuscular re-education, 97535- Self Care, [id number]- Manual therapy, [id number]- Gait training, and [id number]- Aquatic Therapy.  PLAN FOR NEXT  SESSION: gently get her moving as much as possible, could consider transfer to water PT if sx do not improve. Can't tolerate laying supine  Tanda Sorrow, PTA 01/14/24 7:58 AM

## 2024-01-18 ENCOUNTER — Other Ambulatory Visit (INDEPENDENT_AMBULATORY_CARE_PROVIDER_SITE_OTHER)

## 2024-01-18 ENCOUNTER — Ambulatory Visit: Payer: Self-pay | Admitting: Gastroenterology

## 2024-01-18 ENCOUNTER — Ambulatory Visit: Admitting: Gastroenterology

## 2024-01-18 ENCOUNTER — Encounter: Payer: Self-pay | Admitting: Gastroenterology

## 2024-01-18 VITALS — BP 128/70 | HR 84 | Ht 64.0 in | Wt 195.0 lb

## 2024-01-18 DIAGNOSIS — K5900 Constipation, unspecified: Secondary | ICD-10-CM

## 2024-01-18 DIAGNOSIS — D509 Iron deficiency anemia, unspecified: Secondary | ICD-10-CM

## 2024-01-18 LAB — HEMOGLOBIN AND HEMATOCRIT, BLOOD
HCT: 27.9 % — ABNORMAL LOW (ref 36.0–46.0)
Hemoglobin: 9.1 g/dL — ABNORMAL LOW (ref 12.0–15.0)

## 2024-01-18 NOTE — Progress Notes (Signed)
 I agree with the assessment and plan as outlined by Ms. May.

## 2024-01-18 NOTE — Progress Notes (Addendum)
 Chief Complaint: follow-up iron  deficiency anemia  Primary GI Doctor: Dr. Federico   HPI: Lacey Vega is a 73 y.o. A.A. female with past medical history significant for  sickle cell trait, small bowel angioectasias, PUD, achalasia s/p POEM in 2019, asthma, chronic blood loss with iron  deficiency anemia and receives IV iron , DM2, HTN, HLD, AAA (on plavix ) s/p repair asthma, anxiety.  Patient seen in hospital for consultation for anemia, melena on 04/01/2023 by Dr. Avram.Work up in ED notable for : Creatinine 1.39/UN 18.  Hemoglobin 5.4 at infusion center, previously 9.2 on 11/12/2022.  WBCs 5.6.  Gross Melena noted on physical exam.  2 units of PRBC's ordered.   Patient was deviously admitted on 11/06/2022 for symptomatic anemia, fatigue, dark stools, and poor appetite.  Dr. 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  Treated with APC.  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     Patient was last seen by myself in the GI office on 12/03/23 for follow up of IDA and SB angioectasias. Patient at that time she was having intermittent black stools. Recheck her Hgb and it was 7.5. Pt directed to go to the ED. Labs show WBC 4.5/hemoglobin 7.1/hematocrit 33.0 MCV 85 Sodium 139/potassium 4.7 BUN 22/creatinine 1.72 and LFTs within normal limits   She is received 2 units of packed RBCs.   12/05/23 Small bowel enteroscopy, full report below.  Interval History   Patient presents for follow-up on iron  deficiency, accompanied by her daughter. Patient taking oral iron  1 tablet po daily. She is also getting IV iron  infusions prn with hematologist. Patient denies any episodes of blood in stool. She has not had any issues since our last appointment and small bowel enteroscopy on July 5th. Appetite stable. She has gained 5lbs since last visit.  Patient taking Linzess  145 mcg po prn for  constipation. No issues with constipation currently.  She is currently working with physical therapy for generalized weakness which has been helpful. She denies abdominal pain, nausea or vomiting.       She is taking baby ASA 81 mg po daily.  Wt Readings from Last 3 Encounters:  01/18/24 195 lb (88.5 kg)  12/03/23 190 lb (86.2 kg)  11/16/23 191 lb 4.8 oz (86.8 kg)    Past Medical History:  Diagnosis Date   Anemia    Anxiety    Asthma    Asthma    Bronchitis    hx   Colon polyps    hyperplastic   Diabetes (HCC)    mild, history of   DJD (degenerative joint disease), cervical    Dysphagia    history of   Dyspnea    Gallstones    GERD (gastroesophageal reflux disease)    H/O chest pain    secondary to anemia   Headache    History of arteriovenous malformation (AVM)    History of blood transfusion 10/2021   History of hiatal hernia    History of tobacco use    Hyperlipemia    Hypertension    Hypothyroidism    Iron  deficiency anemia    Sickle cell anemia (HCC)    patient is not aware of this   Thyroid  disease    TIA (transient ischemic attack)    was told that she possible had one   Wears dentures    Wears glasses     Past Surgical History:  Procedure Laterality  Date   ABDOMINAL AORTIC ENDOVASCULAR STENT GRAFT Bilateral 01/31/2021   Procedure: ENDOVASCULAR ANEURYSM REPAIR (EVAR)Bilateral Groin Cutdown, left femoral endaterectomy with bovine patch angioplasty.;  Surgeon: Magda Debby SAILOR, MD;  Location: [facility] OR;  Service: Vascular;  Laterality: Bilateral;   ABDOMINAL HYSTERECTOMY     ANTERIOR CERVICAL DECOMP/DISCECTOMY FUSION N/A 10/03/2016   Procedure: ANTERIOR CERVICAL DECOMPRESSION/DISCECTOMY FUSION CERVICAL THREE- CERVICAL FOUR;  Surgeon: Gillie Duncans, MD;  Location: [facility] OR;  Service: Neurosurgery;  Laterality: N/A;  Right side approach   BOTOX  INJECTION  08/29/2015   Procedure: BOTOX  INJECTION;  Surgeon: Jerrell Sol, MD;  Location: [facility] ENDOSCOPY;  Service:  Endoscopy;;   CHOLECYSTECTOMY     COLONOSCOPY  12/03/2011   Procedure: COLONOSCOPY;  Surgeon: Jerrell KYM Sol, MD;  Location: [facility] ENDOSCOPY;  Service: Endoscopy;  Laterality: N/A;   COLONOSCOPY N/A 11/27/2021   Procedure: COLONOSCOPY;  Surgeon: Federico Rosario BROCKS, MD;  Location: [facility];  Service: Gastroenterology;  Laterality: N/A;   ENTEROSCOPY N/A 07/07/2021   Procedure: ENTEROSCOPY;  Surgeon: Rosalie Kitchens, MD;  Location: [facility] ENDOSCOPY;  Service: Endoscopy;  Laterality: N/A;   ENTEROSCOPY N/A 08/06/2021   Procedure: ENTEROSCOPY;  Surgeon: Saintclair Jasper, MD;  Location: [facility] ENDOSCOPY;  Service: Gastroenterology;  Laterality: N/A;   ENTEROSCOPY N/A 11/27/2021   Procedure: SMALL  BOWEL ENTEROSCOPY;  Surgeon: Federico Rosario BROCKS, MD;  Location: [facility] ENDOSCOPY;  Service: Gastroenterology;  Laterality: N/A;   ENTEROSCOPY N/A 11/07/2022   Procedure: ENTEROSCOPY;  Surgeon: Charlanne Groom, MD;  Location: [facility] ENDOSCOPY;  Service: Gastroenterology;  Laterality: N/A;   ENTEROSCOPY N/A 04/03/2023   Procedure: ENTEROSCOPY;  Surgeon: Avram Lupita BRAVO, MD;  Location: [facility]OSCOPY;  Service: Gastroenterology;  Laterality: N/A;   ENTEROSCOPY N/A 12/05/2023   Procedure: ENTEROSCOPY;  Surgeon: Federico Rosario BROCKS, MD;  Location: [facility] ENDOSCOPY;  Service: Gastroenterology;  Laterality: N/A;   ESOPHAGEAL MANOMETRY N/A 07/13/2017   Procedure: ESOPHAGEAL MANOMETRY (EM);  Surgeon: Sol Jerrell, MD;  Location: [facility] ENDOSCOPY;  Service: Endoscopy;  Laterality: N/A;   ESOPHAGOGASTRODUODENOSCOPY  12/03/2011   Procedure: ESOPHAGOGASTRODUODENOSCOPY (EGD);  Surgeon: Jerrell KYM Sol, MD;  Location: [facility];  Service: Endoscopy;  Laterality: N/A;   ESOPHAGOGASTRODUODENOSCOPY (EGD) WITH PROPOFOL  N/A 08/29/2015   Procedure: ESOPHAGOGASTRODUODENOSCOPY (EGD) WITH PROPOFOL ;  Surgeon: Jerrell Sol, MD;  Location: [facility] ENDOSCOPY;  Service: Endoscopy;  Laterality: N/A;   ESOPHAGOGASTRODUODENOSCOPY (EGD) WITH PROPOFOL  N/A 05/17/2019   Procedure:  ESOPHAGOGASTRODUODENOSCOPY (EGD) WITH PROPOFOL ;  Surgeon: Sol Jerrell, MD;  Location: [facility] ENDOSCOPY;  Service: Endoscopy;  Laterality: N/A;   ESOPHAGOGASTRODUODENOSCOPY (EGD) WITH PROPOFOL  N/A 04/24/2021   Procedure: ESOPHAGOGASTRODUODENOSCOPY (EGD) WITH PROPOFOL ;  Surgeon: Elicia Claw, MD;  Location: [facility] ENDOSCOPY;  Service: Gastroenterology;  Laterality: N/A;   GIVENS CAPSULE STUDY N/A 08/05/2018   Procedure: GIVENS CAPSULE STUDY;  Surgeon: Sol Jerrell, MD;  Location: [facility] ENDOSCOPY;  Service: Endoscopy;  Laterality: N/A;   GIVENS CAPSULE STUDY N/A 07/06/2021   Procedure: GIVENS CAPSULE STUDY;  Surgeon: Rosalie Kitchens, MD;  Location: [facility] ENDOSCOPY;  Service: Endoscopy;  Laterality: N/A;   HOT HEMOSTASIS  12/03/2011   Procedure: HOT HEMOSTASIS (ARGON PLASMA COAGULATION/BICAP);  Surgeon: Jerrell KYM Sol, MD;  Location: [facility];  Service: Endoscopy;  Laterality: N/A;   HOT HEMOSTASIS N/A 07/07/2021   Procedure: HOT HEMOSTASIS (ARGON PLASMA COAGULATION/BICAP);  Surgeon: Rosalie Kitchens, MD;  Location: [facility];  Service: Endoscopy;  Laterality: N/A;   HOT HEMOSTASIS N/A 08/06/2021   Procedure: HOT HEMOSTASIS (ARGON PLASMA COAGULATION/BICAP);  Surgeon: Saintclair Jasper, MD;  Location: [facility];  Service: Gastroenterology;  Laterality: N/A;   HOT HEMOSTASIS  11/27/2021   Procedure: HOT HEMOSTASIS (ARGON PLASMA COAGULATION/BICAP);  Surgeon: Federico Rosario BROCKS, MD;  Location: [facility];  Service: Gastroenterology;;  EGD and COLON   HOT HEMOSTASIS N/A 11/07/2022   Procedure: HOT HEMOSTASIS (ARGON PLASMA COAGULATION/BICAP);  Surgeon: Charlanne Groom, MD;  Location: [facility];  Service: Gastroenterology;  Laterality: N/A;   HOT HEMOSTASIS N/A 04/03/2023   Procedure: HOT HEMOSTASIS (ARGON PLASMA COAGULATION/BICAP);  Surgeon: Avram Lupita BRAVO, MD;  Location: [facility];  Service: Gastroenterology;  Laterality: N/A;   HOT HEMOSTASIS N/A 12/05/2023   Procedure: EGD, WITH ARGON PLASMA COAGULATION;  Surgeon:  Federico Rosario BROCKS, MD;  Location: [facility] ENDOSCOPY;  Service: Gastroenterology;  Laterality: N/A;   NECK SURGERY     PERIPHERAL VASCULAR INTERVENTION  03/29/2021   Procedure: PERIPHERAL VASCULAR INTERVENTION;  Surgeon: Magda Debby SAILOR, MD;  Location: [facility] INVASIVE CV LAB;  Service: Cardiovascular;;  Lt SFA Brachial Approach   POLYPECTOMY  11/27/2021   Procedure: POLYPECTOMY;  Surgeon: Federico Rosario BROCKS, MD;  Location: [facility];  Service: Gastroenterology;;   ROTATOR CUFF REPAIR     right   ROTATOR CUFF REPAIR Right 2014   THROMBECTOMY FEMORAL ARTERY Left 01/31/2021   Procedure: THROMBECTOMY POPLITEAL  ARTERY;  Surgeon: Magda Debby SAILOR, MD;  Location: [facility] OR;  Service: Vascular;  Laterality: Left;   TONSILLECTOMY      Current Outpatient Medications  Medication Sig Dispense Refill   albuterol  (VENTOLIN  HFA) 108 (90 Base) MCG/ACT inhaler Inhale 1-2 puffs into the lungs every 6 (six) hours as needed for wheezing or shortness of breath.     aspirin  EC 81 MG tablet Take 81 mg by mouth daily. Swallow whole.     atorvastatin  (LIPITOR) 40 MG tablet Take 40 mg by mouth at bedtime.     Cinnamon 500 MG TABS Take 1,000 mg by mouth every evening.     cyanocobalamin  (VITAMIN B12) 1000 MCG tablet Take 1,000 mcg by mouth daily.     donepezil  (ARICEPT ) 5 MG tablet Take 5 mg by mouth at bedtime.     ferrous gluconate  (FERGON) 324 MG tablet Take 324 mg by mouth daily with breakfast.     folic acid  (FOLVITE ) 800 MCG tablet Take 800 mcg by mouth daily.     levalbuterol (XOPENEX) 0.63 MG/3ML nebulizer solution Take 0.63 mg by nebulization every 6 (six) hours as needed for wheezing or shortness of breath.     levothyroxine  (SYNTHROID ) 125 MCG tablet Take 125 mcg by mouth See admin instructions. Take 125 mcg by mouth before breakfast on Mon/Tues/Wed/Thurs/Fri/Sat (take nothing on Sundays)     LINZESS  145 MCG CAPS capsule TAKE 1 CAPSULE BY MOUTH DAILY BEFORE BREAKFAST. 30 capsule 6   montelukast  (SINGULAIR ) 10 MG tablet Take  10 mg by mouth daily in the afternoon.     Multiple Vitamins-Minerals (HAIR SKIN NAILS PO) Take 2 capsules by mouth daily.     nitroGLYCERIN  (NITROSTAT ) 0.4 MG SL tablet Place 0.4 mg under the tongue every 5 (five) minutes as needed for chest pain.     NON FORMULARY Take 2 tablets by mouth daily. Sea Moss Advanced     Omega-3 Fatty Acids (FISH OIL BURP-LESS) 1200 MG CAPS Take 1,200 mg by mouth daily in the afternoon.     pantoprazole  (PROTONIX ) 40 MG tablet Take 1 tablet (40 mg total) by mouth daily before breakfast. 90 tablet 1   potassium chloride  SA (KLOR-CON  M) 20 MEQ tablet Take 20 mEq by mouth 3 (  three) times a week.     revefenacin (YUPELRI) 175 MCG/3ML nebulizer solution Take 175 mcg by nebulization daily.     spironolactone (ALDACTONE) 25 MG tablet Take 25 mg by mouth daily.     WIXELA INHUB 250-50 MCG/ACT AEPB Inhale 1 puff into the lungs in the morning and at bedtime.     ascorbic acid (VITAMIN C) 500 MG tablet Take 500 mg by mouth daily. (Patient not taking: Reported on 01/18/2024)     No current facility-administered medications for this visit.    Allergies as of 01/18/2024 - Review Complete 01/18/2024  Allergen Reaction Noted   Oxycodone Shortness Of Breath 03/12/2012    Family History  Problem Relation Age of Onset   Diabetes Mother    Heart attack Father    Heart attack Brother    Cancer Brother        type unknown   Cancer Brother        type unknown   Cancer Brother        type unknown   Cancer Maternal Aunt     Review of Systems:    Constitutional: No weight loss, fever, chills, weakness or fatigue HEENT: Eyes: No change in vision               Ears, Nose, Throat:  No change in hearing or congestion Skin: No rash or itching Cardiovascular: No chest pain, chest pressure or palpitations   Respiratory: No SOB or cough Gastrointestinal: See HPI and otherwise negative Genitourinary: No dysuria or change in urinary frequency Neurological: No headache, dizziness  or syncope Musculoskeletal: No new muscle or joint pain Hematologic: No bleeding or bruising Psychiatric: No history of depression or anxiety    Physical Exam:  Vital signs: BP 128/70 (BP Location: Left Arm, Patient Position: Sitting, Cuff Size: Large)   Pulse 84   Ht 5' 4 (1.626 m) Comment: height measured without shoes  Wt 195 lb (88.5 kg)   BMI 33.47 kg/m   Constitutional:   Pleasant  A.A. female appears to be in NAD, Well developed, Well nourished, alert and cooperative Throat: Oral cavity and pharynx without inflammation, swelling or lesion.  Respiratory: Respirations even and unlabored. Lungs clear to auscultation bilaterally.   No wheezes, crackles, or rhonchi.  Cardiovascular: Normal S1, S2. Regular rate and rhythm. No peripheral edema, cyanosis or pallor.  Gastrointestinal:  Soft, nondistended, nontender. No rebound or guarding. Normal bowel sounds. No appreciable masses or hepatomegaly. Msk:  Symmetrical without gross deformities. Without edema, no deformity or joint abnormality. Uses cane. Neurologic:  Alert and  oriented x4;  grossly normal neurologically.  Skin:   Dry and intact without significant lesions or rashes.  RELEVANT LABS AND IMAGING: CBC    Latest Ref Rng & Units 01/11/2024    7:56 AM 12/07/2023    5:21 AM 12/06/2023    2:00 PM  CBC  WBC 4.0 - 10.5 K/uL 5.8  9.0  15.3   Hemoglobin 12.0 - 15.0 g/dL 8.5  9.3  9.7   Hematocrit 36.0 - 46.0 % 27.2  29.7  31.7   Platelets 150 - 400 K/uL 244  239  239      CMP     Latest Ref Rng & Units 01/11/2024    7:56 AM 12/06/2023    2:00 PM 12/05/2023    6:35 AM  CMP  Glucose 70 - 99 mg/dL 899  83  895   BUN 8 - 23 mg/dL 14  14  13  Creatinine 0.44 - 1.00 mg/dL 8.66  8.38  8.64   Sodium 135 - 145 mmol/L 142  141  142   Potassium 3.5 - 5.1 mmol/L 4.2  3.9  4.3   Chloride 98 - 111 mmol/L 110  111  111   CO2 22 - 32 mmol/L 26  21  24    Calcium  8.9 - 10.3 mg/dL 8.7  8.7  8.9   Total Protein 6.5 - 8.1 g/dL 6.7  6.3     Total Bilirubin 0.0 - 1.2 mg/dL 0.8  0.9    Alkaline Phos 38 - 126 U/L 113  77    AST 15 - 41 U/L 14  17    ALT 0 - 44 U/L 7  8       Lab Results  Component Value Date   TSH <0.01 Repeated and verified X2. (L) 11/08/2021    Previous GI workup:    VCE 08/05/18: Small non-bleeding gastric AVM. Small nonbleeding proximal small bowel AVM. Duodenal erosion   EGD 05/17/19: - LA Grade C esophagitis with bleeding. - Normal stomach. - A few small non-bleeding angiodysplastic lesions in the duodenum. - Mucosal changes in the duodenum.   EGD 04/24/21: - Z-line regular, 40 cm from the incisors. - Gastritis with hemorrhage. - Normal duodenal bulb, first portion of the duodenum, second portion of the duodenum and third portion of the duodenum. - No specimens collected.   SBE 07/06/21 Findings: First gastric image 00:01:29 First duodenal image 00:15:34 First cecal image 02:05:04 Blood visualized in the stomach. Duodenal angioectasia visualized at 00:15:44, 00:15:52, and 00:16:23.  Blood clot with possible active bleeding visualized at 00:21:13 and 00:21:23. Summary and Recommendations: Recommend SBE for treatment of duodenal angioectasias and evaluation of bleeding source from the stomach and proximal small bowel. Spoke with Dr. Rosalie and he will plan to do this procedure tomorrow. Made NPO after MN in preparation.   VCE 07/07/21: - Normal esophagus. - A single non-bleeding angiodysplastic lesion in the stomach. Treated with argon plasma coagulation (APC). - Three non-bleeding angiodysplastic lesions in the duodenum. Treated with argon plasma coagulation (APC). - The examined portion of the jejunum was normal. - No specimens collected.   SBE 08/06/21: - LA Grade A esophagitis with bleeding. - Non-bleeding gastric ulcer with a clean ulcer base (Forrest Class III). - Multiple recently bleeding angioectasias in the duodenum. Treated with argon plasma coagulation (APC). - No specimens  collected.   SBE 11/27/21: - LA Grade A esophagitis with no bleeding. - Mild gastritis. - Five non-bleeding angioectasias in the duodenum. Treated with argon plasma coagulation (APC). - Two non-bleeding angioectasias in the jejunum. Treated with argon plasma coagulation (APC). - No specimens collected.   Colonoscopy 11/27/21: Repeat 6 years pending age and co morbidities at that time - The examined portion of the ileum was normal. - Three 3 to 4 mm polyps in the transverse colon and in the ascending colon, removed with a cold snare. Resected and retrieved. - A single non-bleeding colonic angioectasia. Treated with argon plasma coagulation (APC). - Diverticulosis in the sigmoid colon, in the descending colon, in the transverse colon and in the ascending colon. - Non-bleeding internal hemorrhoids. - Anal wart. Path: A. TRANSVERSE COLON, POLYPECTOMY:  Benign colonic mucosa with lymphoid aggregate  B. ASCENDING COLON, POLYPECTOMY:  Tubular adenoma  Negative for high-grade dysplasia and carcinoma    11/07/2022 Small bowel endoscopy with Dr. 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  Treated  with APC.  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    04/03/2023 Small bowel endoscopy with Dr. Avram - A single non- bleeding angiodysplastic lesion in the duodenum. Treated with argon plasma coagulation ( APC) . - The examined portion of the jejunum was normal. - Normal esophagus. - Normal stomach. - No specimens collected.  12/07/23 small bowel enteroscopy with Dr. Federico: Normal esophagus.  Normal stomach.  Three non- bleeding angioectasias in the duodenum. Treated with argon plasma coagulation ( APC) .  Two non- bleeding angioectasias in the jejunum. Treated with argon plasma coagulation ( APC) .  No specimens collected.   Assessment:    Patient is a 73 y.o. 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  7/5>> EGD: 3 mm bleeding AVM in the duodenum, 2 nonbleeding AVMs in jejunum.  Hgb 9.9>9.7>9.3>8.5.  Likely a strong contribution from the patient's sickle cell trait preventing her from reaching a normal Hgb. Baseline Hgb is approximately 10.  Patient only taking baby aspirin  81 mg po daily.  Patient is taking daily oral iron  as well as receiving IV iron  infusions with hematology.  Patient denies any overt bleeding. Will recheck H/H.      For the history of constipation I did recommend patient take the Linzess  more frequently to prevent stool buildup.  Plan: - Check H/H today -Continue oral iron  po daily - Continue seeing hematology for IV iron  infusions as needed -Recommend she continue Linzess  145 mcg po daily prn constipation - Recall colonoscopy 11/19/2028 depending on patient's age and comorbidities at that time   Thank you for the courtesy of this consult. Please call me with any questions or concerns.   Quetzalli Clos, FNP-C [facility] 01/18/2024, 11:28 AM  Cc: Addie Camellia CROME, MD

## 2024-01-18 NOTE — Patient Instructions (Addendum)
-  Continue oral iron  po daily - Continue seeing hematology for IV iron  infusions as needed -Recommend you continue Linzess  145 mcg po daily prn constipation  Your provider has requested that you go to the basement level for lab work before leaving today. Press B on the elevator. The lab is located at the first door on the left as you exit the elevator.   _______________________________________________________  If your blood pressure at your visit was 140/90 or greater, please contact your primary care physician to follow up on this.  _______________________________________________________  If you are age 7 or older, your body mass index should be between 23-30. Your Body mass index is 33.47 kg/m. If this is out of the aforementioned range listed, please consider follow up with your Primary Care Provider.  If you are age 38 or younger, your body mass index should be between 19-25. Your Body mass index is 33.47 kg/m. If this is out of the aformentioned range listed, please consider follow up with your Primary Care Provider.   ________________________________________________________  The [facility] providers would like to encourage you to use MYCHART to communicate with providers for non-urgent requests or questions.  Due to long hold times on the telephone, sending your provider a message by [facility] may be a faster and more efficient way to get a response.  Please allow 48 business hours for a response.  Please remember that this is for non-urgent requests.  _______________________________________________________  [facility] is using a team-based approach to care.  Your team is made up of your doctor and two to three APPS. Our APPS (Nurse Practitioners and Physician Assistants) work with your physician to ensure care continuity for you. They are fully qualified to address your health concerns and develop a treatment plan. They communicate directly with your gastroenterologist to  care for you. Seeing the Advanced Practice Practitioners on your physician's team can help you by facilitating care more promptly, often allowing for earlier appointments, access to diagnostic testing, procedures, and other specialty referrals.

## 2024-01-19 DIAGNOSIS — M5440 Lumbago with sciatica, unspecified side: Secondary | ICD-10-CM | POA: Diagnosis not present

## 2024-01-19 DIAGNOSIS — Z6832 Body mass index (BMI) 32.0-32.9, adult: Secondary | ICD-10-CM | POA: Diagnosis not present

## 2024-01-21 ENCOUNTER — Encounter: Payer: Self-pay | Admitting: Physical Therapy

## 2024-01-21 ENCOUNTER — Ambulatory Visit: Admitting: Physical Therapy

## 2024-01-21 DIAGNOSIS — M6281 Muscle weakness (generalized): Secondary | ICD-10-CM

## 2024-01-21 DIAGNOSIS — M5459 Other low back pain: Secondary | ICD-10-CM | POA: Diagnosis not present

## 2024-01-21 DIAGNOSIS — R29898 Other symptoms and signs involving the musculoskeletal system: Secondary | ICD-10-CM | POA: Diagnosis not present

## 2024-01-21 NOTE — Therapy (Signed)
 OUTPATIENT PHYSICAL THERAPY THORACOLUMBAR TREATMENT    Patient Name: Lacey Vega MRN: [id number] DOB:05/01/1951, 73 y.o., female Today's Date: 01/21/2024  END OF SESSION:  PT End of Session - 01/21/24 1355     Visit Number 3    Number of Visits 21    Date for PT Re-Evaluation 03/22/24    Authorization Type [employer]    Authorization Time Period 01/12/24 to 03/22/24    Progress Note Due on Visit 10    PT Start Time 1348    PT Stop Time 1426    PT Time Calculation (min) 38 min    Activity Tolerance Patient tolerated treatment well;Patient limited by pain    Behavior During Therapy [facility] for tasks assessed/performed;Flat affect           Past Medical History:  Diagnosis Date   Anemia    Anxiety    Asthma    Asthma    Bronchitis    hx   Colon polyps    hyperplastic   Diabetes (HCC)    mild, history of   DJD (degenerative joint disease), cervical    Dysphagia    history of   Dyspnea    Gallstones    GERD (gastroesophageal reflux disease)    H/O chest pain    secondary to anemia   Headache    History of arteriovenous malformation (AVM)    History of blood transfusion 10/2021   History of hiatal hernia    History of tobacco use    Hyperlipemia    Hypertension    Hypothyroidism    Iron  deficiency anemia    Sickle cell anemia (HCC)    patient is not aware of this   Thyroid  disease    TIA (transient ischemic attack)    was told that she possible had one   Wears dentures    Wears glasses    Past Surgical History:  Procedure Laterality Date   ABDOMINAL AORTIC ENDOVASCULAR STENT GRAFT Bilateral 01/31/2021   Procedure: ENDOVASCULAR ANEURYSM REPAIR (EVAR)Bilateral Groin Cutdown, left femoral endaterectomy with bovine patch angioplasty.;  Surgeon: Magda Debby SAILOR, MD;  Location: [facility] OR;  Service: Vascular;  Laterality: Bilateral;   ABDOMINAL HYSTERECTOMY     ANTERIOR CERVICAL DECOMP/DISCECTOMY FUSION N/A 10/03/2016   Procedure: ANTERIOR CERVICAL  DECOMPRESSION/DISCECTOMY FUSION CERVICAL THREE- CERVICAL FOUR;  Surgeon: Gillie Duncans, MD;  Location: [facility] OR;  Service: Neurosurgery;  Laterality: N/A;  Right side approach   BOTOX  INJECTION  08/29/2015   Procedure: BOTOX  INJECTION;  Surgeon: Jerrell Sol, MD;  Location: [facility] ENDOSCOPY;  Service: Endoscopy;;   CHOLECYSTECTOMY     COLONOSCOPY  12/03/2011   Procedure: COLONOSCOPY;  Surgeon: Jerrell KYM Sol, MD;  Location: [facility] ENDOSCOPY;  Service: Endoscopy;  Laterality: N/A;   COLONOSCOPY N/A 11/27/2021   Procedure: COLONOSCOPY;  Surgeon: Federico Rosario BROCKS, MD;  Location: [facility];  Service: Gastroenterology;  Laterality: N/A;   ENTEROSCOPY N/A 07/07/2021   Procedure: ENTEROSCOPY;  Surgeon: Rosalie Kitchens, MD;  Location: [facility] ENDOSCOPY;  Service: Endoscopy;  Laterality: N/A;   ENTEROSCOPY N/A 08/06/2021   Procedure: ENTEROSCOPY;  Surgeon: Saintclair Jasper, MD;  Location: [facility] ENDOSCOPY;  Service: Gastroenterology;  Laterality: N/A;   ENTEROSCOPY N/A 11/27/2021   Procedure: SMALL  BOWEL ENTEROSCOPY;  Surgeon: Federico Rosario BROCKS, MD;  Location: [facility] ENDOSCOPY;  Service: Gastroenterology;  Laterality: N/A;   ENTEROSCOPY N/A 11/07/2022   Procedure: ENTEROSCOPY;  Surgeon: Charlanne Groom, MD;  Location: [facility] ENDOSCOPY;  Service: Gastroenterology;  Laterality: N/A;   ENTEROSCOPY N/A 04/03/2023  Procedure: ENTEROSCOPY;  Surgeon: Avram Lupita BRAVO, MD;  Location: [facility];  Service: Gastroenterology;  Laterality: N/A;   ENTEROSCOPY N/A 12/05/2023   Procedure: ENTEROSCOPY;  Surgeon: Federico Rosario BROCKS, MD;  Location[facility];  Service: Gastroenterology;  Laterality: N/A;   ESOPHAGEAL MANOMETRY N/A 07/13/2017   Procedure: ESOPHAGEAL MANOMETRY (EM);  Surgeon: Dianna Specking, MD;  Location: [facility] ENDOSCOPY;  Service: Endoscopy;  Laterality: N/A;   ESOPHAGOGASTRODUODENOSCOPY  12/03/2011   Procedure: ESOPHAGOGASTRODUODENOSCOPY (EGD);  Surgeon: Specking KYM Dianna, MD;  Location: [facility];  Service: Endoscopy;  Laterality: N/A;    ESOPHAGOGASTRODUODENOSCOPY (EGD) WITH PROPOFOL  N/A 08/29/2015   Procedure: ESOPHAGOGASTRODUODENOSCOPY (EGD) WITH PROPOFOL ;  Surgeon: Specking Dianna, MD;  Location[facility] ENDOSCOPY;  Service: Endoscopy;  Laterality: N/A;   ESOPHAGOGASTRODUODENOSCOPY (EGD) WITH PROPOFOL  N/A 05/17/2019   Procedure: ESOPHAGOGASTRODUODENOSCOPY (EGD) WITH PROPOFOL ;  Surgeon: Dianna Specking, MD;  Location: [facility] ENDOSCOPY;  Service: Endoscopy;  Laterality: N/A;   ESOPHAGOGASTRODUODENOSCOPY (EGD) WITH PROPOFOL  N/A 04/24/2021   Procedure: ESOPHAGOGASTRODUODENOSCOPY (EGD) WITH PROPOFOL ;  Surgeon: Elicia Claw, MD;  Location: [facility] ENDOSCOPY;  Service: Gastroenterology;  Laterality: N/A;   GIVENS CAPSULE STUDY N/A 08/05/2018   Procedure: GIVENS CAPSULE STUDY;  Surgeon: Dianna Specking, MD;  Location: [facility] ENDOSCOPY;  Service: Endoscopy;  Laterality: N/A;   GIVENS CAPSULE STUDY N/A 07/06/2021   Procedure: GIVENS CAPSULE STUDY;  Surgeon: Rosalie Kitchens, MD;  Location: [facility] ENDOSCOPY;  Service: Endoscopy;  Laterality: N/A;   HOT HEMOSTASIS  12/03/2011   Procedure: HOT HEMOSTASIS (ARGON PLASMA COAGULATION/BICAP);  Surgeon: Specking KYM Dianna, MD;  Location: [facility];  Service: Endoscopy;  Laterality: N/A;   HOT HEMOSTASIS N/A 07/07/2021   Procedure: HOT HEMOSTASIS (ARGON PLASMA COAGULATION/BICAP);  Surgeon: Rosalie Kitchens, MD;  Location: [facility];  Service: Endoscopy;  Laterality: N/A;   HOT HEMOSTASIS N/A 08/06/2021   Procedure: HOT HEMOSTASIS (ARGON PLASMA COAGULATION/BICAP);  Surgeon: Saintclair Jasper, MD;  Location: [facility];  Service: Gastroenterology;  Laterality: N/A;   HOT HEMOSTASIS  11/27/2021   Procedure: HOT HEMOSTASIS (ARGON PLASMA COAGULATION/BICAP);  Surgeon: Federico Rosario BROCKS, MD;  Location: [facility];  Service: Gastroenterology;;  EGD and COLON   HOT HEMOSTASIS N/A 11/07/2022   Procedure: HOT HEMOSTASIS (ARGON PLASMA COAGULATION/BICAP);  Surgeon: Charlanne Groom, MD;  Location: [facility];  Service: Gastroenterology;   Laterality: N/A;   HOT HEMOSTASIS N/A 04/03/2023   Procedure: HOT HEMOSTASIS (ARGON PLASMA COAGULATION/BICAP);  Surgeon: Avram Lupita BRAVO, MD;  Location: [facility];  Service: Gastroenterology;  Laterality: N/A;   HOT HEMOSTASIS N/A 12/05/2023   Procedure: EGD, WITH ARGON PLASMA COAGULATION;  Surgeon: Federico Rosario BROCKS, MD;  Location: [facility] ENDOSCOPY;  Service: Gastroenterology;  Laterality: N/A;   NECK SURGERY     PERIPHERAL VASCULAR INTERVENTION  03/29/2021   Procedure: PERIPHERAL VASCULAR INTERVENTION;  Surgeon: Magda Debby SAILOR, MD;  Location: [facility] INVASIVE CV LAB;  Service: Cardiovascular;;  Lt SFA Brachial Approach   POLYPECTOMY  11/27/2021   Procedure: POLYPECTOMY;  Surgeon: Federico Rosario BROCKS, MD;  Location: [facility];  Service: Gastroenterology;;   ROTATOR CUFF REPAIR     right   ROTATOR CUFF REPAIR Right 2014   THROMBECTOMY FEMORAL ARTERY Left 01/31/2021   Procedure: THROMBECTOMY POPLITEAL  ARTERY;  Surgeon: Magda Debby SAILOR, MD;  Location: [facility];  Service: Vascular;  Laterality: Left;   TONSILLECTOMY     Patient Active Problem List   Diagnosis Date Noted   Upper GI bleed 12/04/2023   History of angiodysplasia of intestinal tract 12/04/2023   Chronic kidney disease, stage 3b (HCC) 12/04/2023  Chest pain 12/04/2023   Hypocalcemia 12/04/2023   Thrombocytopenia (HCC) 12/04/2023   Cognitive impairment 12/04/2023   GERD (gastroesophageal reflux disease) 12/04/2023   Obesity (BMI 30-39.9) 12/04/2023   Melena 04/02/2023   Symptomatic anemia 04/02/2023   Angiodysplasia of stomach and duodenum 04/02/2023   GI bleed 04/01/2023   Chronic kidney disease, stage 3a (HCC) 07/05/2021   Iron  deficiency anemia due to chronic blood loss 05/09/2021   Acute on chronic blood loss anemia 04/22/2021   AKI (acute kidney injury) (HCC) 04/22/2021   Epistaxis 04/22/2021   PAD (peripheral artery disease) (HCC) 04/22/2021   HLD (hyperlipidemia) 04/22/2021   Sickle cell trait (HCC) 04/22/2021   AAA (abdominal  aortic aneurysm) 02/01/2021   Aortic aneurysm (HCC) 01/31/2021   Achalasia 10/11/2017   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   DJD (degenerative joint disease), cervical    Iron  deficiency anemia    History of arteriovenous malformation (AVM)    History of tobacco use    Asthma    H/O chest pain    Diabetes (HCC)    Dysphagia    Iron  deficiency anemia, unspecified 12/03/2011   Hypothyroidism (acquired) 10/07/2010    Class: Chronic   DYSPHAGIA UNSPECIFIED 10/05/2009   ANEMIA 08/20/2009   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   DM 04/17/2009   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/05/1990    Class: Chronic    PCP: Addie Locus MD   REFERRING PROVIDER: Gillie Duncans, MD  REFERRING DIAG: Diagnosis M54.40 (ICD-10-CM) - Lumbago with sciatica, unspecified side  Rationale for Evaluation and Treatment: Rehabilitation  THERAPY DIAG:  Other low back pain  Muscle weakness (generalized)  Other symptoms and signs involving the musculoskeletal system  ONSET DATE: MD order 12/23/23  SUBJECTIVE:                                                                                                                                                                                           SUBJECTIVE STATEMENT:   Nothing new, doing OK  EVAL: Not sure what's happening with my back, it just hurts. When my blood gets low in the past my back hurt along with hit, when I got blood my back felt better. This time the pain stayed, its the lower part of my back. I'm OK as long as I'm sitting down but when I get up and move around it bothers me. Leaning forward takes pressure off my back.   PERTINENT HISTORY:  See above   PAIN:  Are you having pain? No I'm fine no number given on NPRS   PRECAUTIONS: None  RED FLAGS: None   WEIGHT  BEARING RESTRICTIONS: No  FALLS:  Has patient fallen in last 6 months? 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  DIAGNOSTIC FINDINGS:   Narrative & Impression  MR LUMBAR SPINE WITHOUT IV CONTRAST   COMPARISON: None available   CLINICAL HISTORY: None available   TECHNIQUE: SAG T2, SAG T1, SAG STIR, AX T2, AX T1 without IV contrast.   FINDINGS: There is normal alignment of the lumbar spine. Mild multilevel disc desiccation and mild-to-moderate facet arthrosis. There is no vertebral body height loss, subluxation or marrow replacing process. The sacrum and SI joints are unremarkable so far as visualized. Conus and cauda equina are unremarkable.   T12-L1: There is no focal disc protrusion, foraminal or spinal stenosis.   L1-2: There is no focal disc protrusion, foraminal or spinal stenosis. Mild facet arthrosis.   L2-3: Mild broad-based bulge slightly effacing the ventral thecal sac. Moderate facet arthrosis bilaterally. There is caudal foraminal narrowing, left slightly greater than right. No impingement of the descending nerve roots or exiting nerves.   L3-4: Mild disc desiccation moderate facet arthrosis. No significant foraminal or spinal stenosis.   L4-5: Broad-based disc osteophyte moderate facet arthrosis. No significant foraminal or spinal stenosis.   L5-S1: There is a transitional vertebral level on the left with fusion of the L5 vertebral body in the S1 vertebral body best seen on sagittal image 14 and 15. No significant foraminal or spinal stenosis. Mild facet arthrosis is   There is a large  infrarenal abdominal aortic aneurysm measuring 5.4 cm in AP dimension, 5.5 cm in transverse dimension and 10 cm in length. There is an endoluminal stent graft in place. Size of the native aneurysm is relatively stable when compared with the prior CTA.   IMPRESSION: Mild degenerative disc desiccation facet arthrosis. No significant foraminal or spinal stenosis. Mild multilevel facet arthrosis most notably on the right at L3-4.    PATIENT SURVEYS:  PSFS: THE PATIENT SPECIFIC FUNCTIONAL SCALE  Place score of 0-10 (0 = unable to perform activity and 10 = able to perform activity at the same level as before injury or problem)  Activity Date: 01/12/24    Standing up for long periods  0    2. Walking for long periods  0    3.     4.      Total Score 0      Total Score = Sum of activity scores/number of activities  Minimally Detectable Change: 3 points (for single activity); 2 points (for average score)  [facility] (nd). The Patient Specific Functional Scale . Retrieved from [email or web address]   COGNITION: Overall cognitive status: Within functional limits for tasks assessed     SENSATION: [facility] Not tested  MUSCLE LENGTH:  Unable to assess, pt resisted passive movements for mm flexibility checks   POSTURE: rounded shoulders, forward head, increased lumbar lordosis, and decreased thoracic kyphosis   LUMBAR ROM:   AROM eval  Flexion 50% limited   Extension 75% limited   Right lateral flexion 75% limited  Left lateral flexion 75% limited   Right rotation 50% limited   Left rotation 50% limited    (Blank rows = not tested)    LOWER EXTREMITY MMT:    MMT Right eval Left eval  Hip flexion 3- 3-  Hip extension    Hip abduction    Hip adduction    Hip internal rotation    Hip external rotation    Knee flexion 4 4  Knee extension 4 4  Ankle dorsiflexion    Ankle plantarflexion    Ankle inversion     Ankle eversion     (Blank rows = not tested)    TREATMENT DATE:    01/21/24  Nustep L2 x8 minutes seat 9, all four extremities Goal check/added LTG for balance    Seated TA set 15x3 seconds  Seated TA set + march x15 B Hip hike x10 B LAQs green TB x10 B (alternating)  HS green TB x10 B (alternating) Seated hip ABD green TB x15  One step on 4 inch step/other on floor 3x30 seconds B  Narrow BOS on blue foam circle 2x30 seconds   01/14/24  Shoulder AAROM 1lb WaTE Flex, Ext, IR x10 Education on diagnosis HS curls red 2x10 LAQ 2lb 2x10 Seated Rows red 2x10 Seated March 2x10  Hip add ball squeeze2x10 Hip abd green 2x10 Shoulder Ext red 2x10  NuStep L4 x 4 min  01/12/24  Eval, POC, HEP practice and education as below  Nustep L1x6 minutes seat 9 all four extremities                                                                                                                                    PATIENT EDUCATION:  Education details: exam findings, POC, HEP  Person educated: Patient Education method: Programmer, multimedia, Demonstration, and Handouts Education comprehension: verbalized understanding, returned demonstration, and needs further education  HOME EXERCISE PROGRAM:  Access Code: [email or web address] URL: [email or web address] Date: 01/21/2024 Prepared by: Josette Rough  Exercises - Seated Flexion Stretch  - 2 x daily - 7 x weekly - 1 sets - 10 reps - 3 seconds  hold - Seated Sidebending  - 2 x daily - 7 x weekly - 1 sets - 10 reps - 3 seconds  hold - Seated Trunk Rotation - Arms Crossed  - 2 x daily - 7 x weekly - 1 sets - 10 reps - 3 seconds  hold - Standing Transverse Abdominis Contraction  - 2 x daily - 7 x weekly - 1 sets - 15 reps - 3 seconds  hold - Seated Transversus Abdominus Bracing + March  - 2 x daily - 7 x weekly - 1 sets - 10 reps - Standing Hip Hiking  - 2 x daily - 7 x weekly - 1 sets - 10 reps  ASSESSMENT:  CLINICAL  IMPRESSION:   Arrives today doing OK, sounds like even though it is still early in  POC she is starting to feel a little better. Progressed all activities as tolerated and updated HEP a bit too. Does have a significant fear of falling that came out today with more dynamic transfer activities. Will continue to assess and progress.   OBJECTIVE IMPAIRMENTS: Abnormal gait, decreased activity tolerance, decreased mobility, difficulty walking, decreased ROM, decreased strength, hypomobility, increased fascial restrictions, increased muscle spasms, impaired flexibility, obesity, and pain.   ACTIVITY LIMITATIONS: carrying, lifting, standing, squatting, stairs, transfers, hygiene/grooming, and locomotion level  PARTICIPATION LIMITATIONS: meal prep, cleaning, laundry, driving, shopping, community activity, and yard work  PERSONAL FACTORS: Age, Behavior pattern, Education, Fitness, and Past/current experiences are also affecting patient's functional outcome.   REHAB POTENTIAL: Fair chronicity of pain, low health literacy   CLINICAL DECISION MAKING: Stable/uncomplicated  EVALUATION COMPLEXITY: Low   GOALS: Goals reviewed with patient? No  SHORT TERM GOALS: Target date: 02/16/2024    Will be compliant with appropriate progressive HEP  Baseline:// Goal status: Ongoing   2.  Lumbar AROM to be no more than 25% limited all planes  Baseline:  Goal status: INITIAL  3.  Back pain to be no more than 7/10 at worst  Baseline:  Goal status: ONGOING 01/21/24 still 10/10 at worst   4.  Will demonstrate improved awareness of functional posture  Baseline:  Goal status: ONGOING 01/21/24    LONG TERM GOALS: Target date: 03/22/2024    MMT to have improved by at least 1 grade all weak groups  Baseline:  Goal status: INITIAL  2.  Will be able to stand and cook/perform house work as desired for at least 45 minutes before back pain increases  Baseline:  Goal status: INITIAL  3.  Will be able to  complete a grocery shopping trip of 30-45 minutes in duration before back pain increases  Baseline:  Goal status: INITIAL  4.  Back pain to be no more than 5/10 at worst  Baseline:  Goal status: INITIAL  5.  PSFS to have improved by at least 2 points  Baseline:  Goal status: INITIAL  6. Will score at least 48 on Berg to show improved functional balance  Goal status: NEW 01/21/24    PLAN:  PT FREQUENCY: 2x/week  PT DURATION: 10 weeks  PLANNED INTERVENTIONS: 97750- Physical Performance Testing, 97110-Therapeutic exercises, 97530- Therapeutic activity, 97112- Neuromuscular re-education, 97535- Self Care, [id number]- Manual therapy, [id number]- Gait training, and [id number]- Aquatic Therapy.  PLAN FOR NEXT SESSION: Can't tolerate laying supine. gently get her moving as much as possible, could consider transfer to water PT if sx do not improve. Core strengthening, balance   Josette Rough, PT, DPT 01/21/24 2:27 PM

## 2024-01-26 ENCOUNTER — Encounter: Admitting: Physical Therapy

## 2024-01-29 ENCOUNTER — Ambulatory Visit: Admitting: Physical Therapy

## 2024-01-29 DIAGNOSIS — M6281 Muscle weakness (generalized): Secondary | ICD-10-CM | POA: Diagnosis not present

## 2024-01-29 DIAGNOSIS — R29898 Other symptoms and signs involving the musculoskeletal system: Secondary | ICD-10-CM | POA: Diagnosis not present

## 2024-01-29 DIAGNOSIS — M5459 Other low back pain: Secondary | ICD-10-CM | POA: Diagnosis not present

## 2024-01-29 NOTE — Therapy (Signed)
 OUTPATIENT PHYSICAL THERAPY THORACOLUMBAR TREATMENT    Patient Name: Lacey Vega MRN: [id number] DOB:1951-05-26, 73 y.o., female Today's Date: 01/29/2024  END OF SESSION:  PT End of Session - 01/29/24 0759     Visit Number 4    Number of Visits 21    Date for PT Re-Evaluation 03/22/24    Authorization Type [employer]    Authorization Time Period 01/12/24 to 03/22/24    PT Start Time 0800    PT Stop Time 0840    PT Time Calculation (min) 40 min           Past Medical History:  Diagnosis Date   Anemia    Anxiety    Asthma    Asthma    Bronchitis    hx   Colon polyps    hyperplastic   Diabetes (HCC)    mild, history of   DJD (degenerative joint disease), cervical    Dysphagia    history of   Dyspnea    Gallstones    GERD (gastroesophageal reflux disease)    H/O chest pain    secondary to anemia   Headache    History of arteriovenous malformation (AVM)    History of blood transfusion 10/2021   History of hiatal hernia    History of tobacco use    Hyperlipemia    Hypertension    Hypothyroidism    Iron  deficiency anemia    Sickle cell anemia (HCC)    patient is not aware of this   Thyroid  disease    TIA (transient ischemic attack)    was told that she possible had one   Wears dentures    Wears glasses    Past Surgical History:  Procedure Laterality Date   ABDOMINAL AORTIC ENDOVASCULAR STENT GRAFT Bilateral 01/31/2021   Procedure: ENDOVASCULAR ANEURYSM REPAIR (EVAR)Bilateral Groin Cutdown, left femoral endaterectomy with bovine patch angioplasty.;  Surgeon: Magda Debby SAILOR, MD;  Location: [facility] OR;  Service: Vascular;  Laterality: Bilateral;   ABDOMINAL HYSTERECTOMY     ANTERIOR CERVICAL DECOMP/DISCECTOMY FUSION N/A 10/03/2016   Procedure: ANTERIOR CERVICAL DECOMPRESSION/DISCECTOMY FUSION CERVICAL THREE- CERVICAL FOUR;  Surgeon: Gillie Duncans, MD;  Location: [facility] OR;  Service: Neurosurgery;  Laterality: N/A;  Right side approach   BOTOX  INJECTION   08/29/2015   Procedure: BOTOX  INJECTION;  Surgeon: Jerrell Sol, MD;  Location: [facility] ENDOSCOPY;  Service: Endoscopy;;   CHOLECYSTECTOMY     COLONOSCOPY  12/03/2011   Procedure: COLONOSCOPY;  Surgeon: Jerrell KYM Sol, MD;  Location: [facility] ENDOSCOPY;  Service: Endoscopy;  Laterality: N/A;   COLONOSCOPY N/A 11/27/2021   Procedure: COLONOSCOPY;  Surgeon: Federico Rosario BROCKS, MD;  Location: [facility];  Service: Gastroenterology;  Laterality: N/A;   ENTEROSCOPY N/A 07/07/2021   Procedure: ENTEROSCOPY;  Surgeon: Rosalie Kitchens, MD;  Location: [facility] ENDOSCOPY;  Service: Endoscopy;  Laterality: N/A;   ENTEROSCOPY N/A 08/06/2021   Procedure: ENTEROSCOPY;  Surgeon: Saintclair Jasper, MD;  Location: [facility] ENDOSCOPY;  Service: Gastroenterology;  Laterality: N/A;   ENTEROSCOPY N/A 11/27/2021   Procedure: SMALL  BOWEL ENTEROSCOPY;  Surgeon: Federico Rosario BROCKS, MD;  Location: [facility] ENDOSCOPY;  Service: Gastroenterology;  Laterality: N/A;   ENTEROSCOPY N/A 11/07/2022   Procedure: ENTEROSCOPY;  Surgeon: Charlanne Groom, MD;  Location: [facility] ENDOSCOPY;  Service: Gastroenterology;  Laterality: N/A;   ENTEROSCOPY N/A 04/03/2023   Procedure: ENTEROSCOPY;  Surgeon: Avram Lupita BRAVO, MD;  Location: [facility]OSCOPY;  Service: Gastroenterology;  Laterality: N/A;   ENTEROSCOPY N/A 12/05/2023   Procedure: ENTEROSCOPY;  Surgeon: Federico,  Rosario BROCKS, MD;  Location: [facility] ENDOSCOPY;  Service: Gastroenterology;  Laterality: N/A;   ESOPHAGEAL MANOMETRY N/A 07/13/2017   Procedure: ESOPHAGEAL MANOMETRY (EM);  Surgeon: Dianna Specking, MD;  Location: [facility] ENDOSCOPY;  Service: Endoscopy;  Laterality: N/A;   ESOPHAGOGASTRODUODENOSCOPY  12/03/2011   Procedure: ESOPHAGOGASTRODUODENOSCOPY (EGD);  Surgeon: Specking KYM Dianna, MD;  Location: [facility];  Service: Endoscopy;  Laterality: N/A;   ESOPHAGOGASTRODUODENOSCOPY (EGD) WITH PROPOFOL  N/A 08/29/2015   Procedure: ESOPHAGOGASTRODUODENOSCOPY (EGD) WITH PROPOFOL ;  Surgeon: Specking Dianna, MD;  Location: [facility] ENDOSCOPY;  Service:  Endoscopy;  Laterality: N/A;   ESOPHAGOGASTRODUODENOSCOPY (EGD) WITH PROPOFOL  N/A 05/17/2019   Procedure: ESOPHAGOGASTRODUODENOSCOPY (EGD) WITH PROPOFOL ;  Surgeon: Dianna Specking, MD;  Location: [facility] ENDOSCOPY;  Service: Endoscopy;  Laterality: N/A;   ESOPHAGOGASTRODUODENOSCOPY (EGD) WITH PROPOFOL  N/A 04/24/2021   Procedure: ESOPHAGOGASTRODUODENOSCOPY (EGD) WITH PROPOFOL ;  Surgeon: Elicia Claw, MD;  Location: [facility] ENDOSCOPY;  Service: Gastroenterology;  Laterality: N/A;   GIVENS CAPSULE STUDY N/A 08/05/2018   Procedure: GIVENS CAPSULE STUDY;  Surgeon: Dianna Specking, MD;  Location: [facility]OSCOPY;  Service: Endoscopy;  Laterality: N/A;   GIVENS CAPSULE STUDY N/A 07/06/2021   Procedure: GIVENS CAPSULE STUDY;  Surgeon: Rosalie Kitchens, MD;  Location: [facility] ENDOSCOPY;  Service: Endoscopy;  Laterality: N/A;   HOT HEMOSTASIS  12/03/2011   Procedure: HOT HEMOSTASIS (ARGON PLASMA COAGULATION/BICAP);  Surgeon: Specking KYM Dianna, MD;  Location: [facility];  Service: Endoscopy;  Laterality: N/A;   HOT HEMOSTASIS N/A 07/07/2021   Procedure: HOT HEMOSTASIS (ARGON PLASMA COAGULATION/BICAP);  Surgeon: Rosalie Kitchens, MD;  Location: [facility];  Service: Endoscopy;  Laterality: N/A;   HOT HEMOSTASIS N/A 08/06/2021   Procedure: HOT HEMOSTASIS (ARGON PLASMA COAGULATION/BICAP);  Surgeon: Saintclair Jasper, MD;  Location: [facility];  Service: Gastroenterology;  Laterality: N/A;   HOT HEMOSTASIS  11/27/2021   Procedure: HOT HEMOSTASIS (ARGON PLASMA COAGULATION/BICAP);  Surgeon: Federico Rosario BROCKS, MD;  Location: [facility];  Service: Gastroenterology;;  EGD and COLON   HOT HEMOSTASIS N/A 11/07/2022   Procedure: HOT HEMOSTASIS (ARGON PLASMA COAGULATION/BICAP);  Surgeon: Charlanne Groom, MD;  Location: [facility];  Service: Gastroenterology;  Laterality: N/A;   HOT HEMOSTASIS N/A 04/03/2023   Procedure: HOT HEMOSTASIS (ARGON PLASMA COAGULATION/BICAP);  Surgeon: Avram Lupita BRAVO, MD;  Location: [facility];  Service: Gastroenterology;   Laterality: N/A;   HOT HEMOSTASIS N/A 12/05/2023   Procedure: EGD, WITH ARGON PLASMA COAGULATION;  Surgeon: Federico Rosario BROCKS, MD;  Location: [facility] ENDOSCOPY;  Service: Gastroenterology;  Laterality: N/A;   NECK SURGERY     PERIPHERAL VASCULAR INTERVENTION  03/29/2021   Procedure: PERIPHERAL VASCULAR INTERVENTION;  Surgeon: Magda Debby SAILOR, MD;  Location: [facility] INVASIVE CV LAB;  Service: Cardiovascular;;  Lt SFA Brachial Approach   POLYPECTOMY  11/27/2021   Procedure: POLYPECTOMY;  Surgeon: Federico Rosario BROCKS, MD;  Location: [facility];  Service: Gastroenterology;;   ROTATOR CUFF REPAIR     right   ROTATOR CUFF REPAIR Right 2014   THROMBECTOMY FEMORAL ARTERY Left 01/31/2021   Procedure: THROMBECTOMY POPLITEAL  ARTERY;  Surgeon: Magda Debby SAILOR, MD;  Location: [facility] OR;  Service: Vascular;  Laterality: Left;   TONSILLECTOMY     Patient Active Problem List   Diagnosis Date Noted   Upper GI bleed 12/04/2023   History of angiodysplasia of intestinal tract 12/04/2023   Chronic kidney disease, stage 3b (HCC) 12/04/2023   Chest pain 12/04/2023   Hypocalcemia 12/04/2023   Thrombocytopenia (HCC) 12/04/2023   Cognitive impairment 12/04/2023   GERD (gastroesophageal reflux disease) 12/04/2023   Obesity (BMI  30-39.9) 12/04/2023   Melena 04/02/2023   Symptomatic anemia 04/02/2023   Angiodysplasia of stomach and duodenum 04/02/2023   GI bleed 04/01/2023   Chronic kidney disease, stage 3a (HCC) 07/05/2021   Iron  deficiency anemia due to chronic blood loss 05/09/2021   Acute on chronic blood loss anemia 04/22/2021   AKI (acute kidney injury) (HCC) 04/22/2021   Epistaxis 04/22/2021   PAD (peripheral artery disease) (HCC) 04/22/2021   HLD (hyperlipidemia) 04/22/2021   Sickle cell trait (HCC) 04/22/2021   AAA (abdominal aortic aneurysm) 02/01/2021   Aortic aneurysm (HCC) 01/31/2021   Achalasia 10/11/2017   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   DJD (degenerative joint disease), cervical     Iron  deficiency anemia    History of arteriovenous malformation (AVM)    History of tobacco use    Asthma    H/O chest pain    Diabetes (HCC)    Dysphagia    Iron  deficiency anemia, unspecified 12/03/2011   Hypothyroidism (acquired) 10/07/2010    Class: Chronic   DYSPHAGIA UNSPECIFIED 10/05/2009   ANEMIA 08/20/2009   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   DM 04/17/2009   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/05/1990    Class: Chronic    PCP: Addie Locus MD   REFERRING PROVIDER: Gillie Duncans, MD  REFERRING DIAG: Diagnosis M54.40 (ICD-10-CM) - Lumbago with sciatica, unspecified side  Rationale for Evaluation and Treatment: Rehabilitation  THERAPY DIAG:  Other low back pain  Muscle weakness (generalized)  ONSET DATE: MD order 12/23/23  SUBJECTIVE:                                                                                                                                                                                           SUBJECTIVE STATEMENT:   Doing pretty good. Overall 50 % better.No pain currently. Pain just when I walk and time/distance varies.Doing HEP  EVAL: Not sure what's happening with my back, it just hurts. When my blood gets low in the past my back hurt along with hit, when I got blood my back felt better. This time the pain stayed, its the lower part of my back. I'm OK as long as I'm sitting down but when I get up and move around it bothers me. Leaning forward takes pressure off my back.   PERTINENT HISTORY:  See above   PAIN:  Are you having pain? No I'm fine no number given on NPRS   PRECAUTIONS: None  RED FLAGS: None   WEIGHT BEARING RESTRICTIONS: No  FALLS:  Has patient fallen in last 6 months? 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  DIAGNOSTIC FINDINGS:   Narrative & Impression  MR LUMBAR SPINE WITHOUT IV CONTRAST   COMPARISON: None available   CLINICAL HISTORY: None available   TECHNIQUE: SAG T2, SAG T1, SAG STIR, AX T2, AX T1 without IV contrast.   FINDINGS: There is normal alignment of the lumbar spine. Mild multilevel disc desiccation and mild-to-moderate facet arthrosis. There is no vertebral body height loss, subluxation or marrow replacing process. The sacrum and SI joints are unremarkable so far as visualized. Conus and cauda equina are unremarkable.   T12-L1: There is no focal disc protrusion, foraminal or spinal stenosis.   L1-2: There is no focal disc protrusion, foraminal or spinal stenosis. Mild facet arthrosis.   L2-3: Mild broad-based bulge slightly effacing the ventral thecal sac. Moderate facet arthrosis bilaterally. There is caudal foraminal narrowing, left slightly greater than right. No impingement of the descending nerve roots or exiting nerves.   L3-4: Mild disc desiccation moderate facet arthrosis. No significant foraminal or spinal stenosis.   L4-5: Broad-based disc osteophyte moderate facet arthrosis. No significant foraminal or spinal stenosis.   L5-S1: There is a transitional vertebral level on the left with fusion of the L5 vertebral body in the S1 vertebral body best seen on sagittal image 14 and 15. No significant foraminal or spinal stenosis. Mild facet arthrosis is   There is a large infrarenal abdominal aortic aneurysm measuring 5.4 cm in AP dimension, 5.5 cm in transverse dimension and 10 cm in length. There is an endoluminal stent graft in place. Size of the  native aneurysm is relatively stable when compared with the prior CTA.   IMPRESSION: Mild degenerative disc desiccation facet arthrosis. No significant foraminal or spinal stenosis. Mild multilevel facet arthrosis most notably on the right at L3-4.    PATIENT SURVEYS:  PSFS: THE PATIENT SPECIFIC FUNCTIONAL SCALE  Place score of 0-10 (0 = unable to perform activity and 10 = able to perform activity at the same level as before injury or problem)  Activity Date: 01/12/24    Standing up for long periods  0    2. Walking for long periods  0    3.     4.      Total Score 0      Total Score = Sum of activity scores/number of activities  Minimally Detectable Change: 3 points (for single activity); 2 points (for average score)  [facility] (nd). The Patient Specific Functional Scale . Retrieved from [email or web address]   COGNITION: Overall cognitive status: Within functional limits for tasks assessed     SENSATION: [facility] Not tested  MUSCLE LENGTH:  Unable to assess, pt resisted passive movements for mm flexibility checks   POSTURE: rounded shoulders, forward head, increased lumbar lordosis, and decreased thoracic kyphosis   LUMBAR ROM:   AROM eval 8/292/5  Flexion 50% limited  50% limited  Extension 75% limited  50% limited  Right lateral flexion 75% limited  50% limited  Left lateral flexion 75% limited  50% limited  Right rotation 50% limited  Left rotation 50% limited     (Blank rows = not tested)    LOWER EXTREMITY MMT:    MMT Right eval Left eval RT/Left 01/29/24  Hip flexion 3- 3- 3/3+  Hip extension     Hip abduction     Hip adduction     Hip internal rotation     Hip external rotation     Knee flexion 4 4   Knee extension 4 4   Ankle dorsiflexion     Ankle plantarflexion     Ankle inversion     Ankle eversion      (Blank rows = not tested)    TREATMENT DATE:   01/29/24 Nustep L  6 Checked Goals and ROM and MMT Blue tband trunk ext 2 sets 10 Isometric abdominal squeeze 2 sets 10 with ball Standing red tband row and shld ext 2 sets 10 STS yellow ball chest press 10 x Red tband standing SL hip flex,ext and abd 10 x each ADDED to HEP -see below  01/21/24  Nustep L2 x8 minutes seat 9, all four extremities Goal check/added LTG for balance    Seated TA set 15x3 seconds  Seated TA set + march x15 B Hip hike x10 B LAQs green TB x10 B (alternating)  HS green TB x10 B (alternating) Seated hip ABD green TB x15  One step on 4 inch step/other on floor 3x30 seconds B  Narrow BOS on blue foam circle 2x30 seconds   01/14/24  Shoulder AAROM 1lb WaTE Flex, Ext, IR x10 Education on diagnosis HS curls red 2x10 LAQ 2lb 2x10 Seated Rows red 2x10 Seated March 2x10  Hip add ball squeeze2x10 Hip abd green 2x10 Shoulder Ext red 2x10  NuStep L4 x 4 min  01/12/24  Eval, POC, HEP practice and education as below  Nustep L1x6 minutes seat 9 all four extremities                                                                                                                                    PATIENT EDUCATION:  Education details: exam findings, POC, HEP  Person educated: Patient Education method: Explanation, Demonstration, and Handouts Education comprehension: verbalized understanding, returned demonstration, and needs further education  HOME EXERCISE PROGRAM:  Access Code: [email or web address] URL: [email or web address] Date: 01/29/2024 Prepared by: Kaelie Henigan  Exercises - Standing Hip Flexion with Resistance Loop  - 1 x daily - 7 x weekly - 1 sets - 10 reps - Hip Abduction with Resistance Loop  - 1 x daily - 7 x weekly - 1 sets - 10 reps - Hip Extension with Resistance Loop  - 1 x daily - 7 x weekly - 1 sets - 10 reps - Sit to Stand  - 1 x daily - 7 x weekly - 1 sets - 10 reps - Seated Hip Flexion  - 1 x daily - 7 x weekly - 2 sets -  10 reps -  Seated Long Arc Quad  - 1 x daily - 7 x weekly - 2 sets - 10 reps - Seated Hip Abduction with Resistance  - 1 x daily - 7 x weekly - 2 sets - 10 reps  Access Code: [email or web address] URL: [email or web address] Date: 01/21/2024 Prepared by: Josette Rough  Exercises - Seated Flexion Stretch  - 2 x daily - 7 x weekly - 1 sets - 10 reps - 3 seconds  hold - Seated Sidebending  - 2 x daily - 7 x weekly - 1 sets - 10 reps - 3 seconds  hold - Seated Trunk Rotation - Arms Crossed  - 2 x daily - 7 x weekly - 1 sets - 10 reps - 3 seconds  hold - Standing Transverse Abdominis Contraction  - 2 x daily - 7 x weekly - 1 sets - 15 reps - 3 seconds  hold - Seated Transversus Abdominus Bracing + March  - 2 x daily - 7 x weekly - 1 sets - 10 reps - Standing Hip Hiking  - 2 x daily - 7 x weekly - 1 sets - 10 reps  ASSESSMENT:  CLINICAL IMPRESSION: assessed and documented ROM and goals, making good func gains. Pt reports back 50% better, pain just when walking, distance /time is variable before it starts, no pain at rest. Progressed func strengthening with cuing for speed ,control and correct muscle activation. Added to HEP    OBJECTIVE IMPAIRMENTS: Abnormal gait, decreased activity tolerance, decreased mobility, difficulty walking, decreased ROM, decreased strength, hypomobility, increased fascial restrictions, increased muscle spasms, impaired flexibility, obesity, and pain.   ACTIVITY LIMITATIONS: carrying, lifting, standing, squatting, stairs, transfers, hygiene/grooming, and locomotion level  PARTICIPATION LIMITATIONS: meal prep, cleaning, laundry, driving, shopping, community activity, and yard work  PERSONAL FACTORS: Age, Behavior pattern, Education, Fitness, and Past/current experiences are also affecting patient's functional outcome.   REHAB POTENTIAL: Fair chronicity of pain, low health literacy   CLINICAL DECISION MAKING: Stable/uncomplicated  EVALUATION COMPLEXITY: Low   GOALS: Goals  reviewed with patient? No  SHORT TERM GOALS: Target date: 02/16/2024    Will be compliant with appropriate progressive HEP  Baseline:// Goal status: 01/29/24 MET  2.  Lumbar AROM to be no more than 25% limited all planes  Baseline:  Goal status: progressing 01/29/24  3.  Back pain to be no more than 7/10 at worst  Baseline:  Goal status: MET 01/26/24  4.  Will demonstrate improved awareness of functional posture  Baseline:  Goal status: ONGOING 01/21/24  and 01/29/24    LONG TERM GOALS: Target date: 03/22/2024    MMT to have improved by at least 1 grade all weak groups  Baseline:  Goal status: INITIAL  2.  Will be able to stand and cook/perform house work as desired for at least 45 minutes before back pain increases  Baseline:  Goal status: INITIAL  3.  Will be able to complete a grocery shopping trip of 30-45 minutes in duration before back pain increases  Baseline:  Goal status: INITIAL  4.  Back pain to be no more than 5/10 at worst  Baseline:  Goal status: INITIAL  5.  PSFS to have improved by at least 2 points  Baseline:  Goal status: INITIAL  6. Will score at least 48 on Berg to show improved functional balance  Goal status: NEW 01/21/24    PLAN:  PT FREQUENCY: 2x/week  PT DURATION: 10 weeks  PLANNED INTERVENTIONS: 97750- Physical Performance Testing, 97110-Therapeutic  exercises, 97530- Therapeutic activity, [id number]- Neuromuscular re-education, [id number]- Self Care, [id number]- Manual therapy, [id number]- Gait training, and [id number]- Aquatic Therapy.  PLAN FOR NEXT SESSION: Can't tolerate laying supine.  Continue to progress strength and balance. Assess new HEP  Jon Or PTA 01/29/24 8:34 AM

## 2024-02-08 ENCOUNTER — Ambulatory Visit: Attending: Neurosurgery

## 2024-02-08 ENCOUNTER — Other Ambulatory Visit: Payer: Self-pay

## 2024-02-08 ENCOUNTER — Other Ambulatory Visit: Payer: Self-pay | Admitting: Physician Assistant

## 2024-02-08 DIAGNOSIS — J449 Chronic obstructive pulmonary disease, unspecified: Secondary | ICD-10-CM | POA: Diagnosis not present

## 2024-02-08 DIAGNOSIS — M6281 Muscle weakness (generalized): Secondary | ICD-10-CM | POA: Insufficient documentation

## 2024-02-08 DIAGNOSIS — R29898 Other symptoms and signs involving the musculoskeletal system: Secondary | ICD-10-CM | POA: Diagnosis not present

## 2024-02-08 DIAGNOSIS — I119 Hypertensive heart disease without heart failure: Secondary | ICD-10-CM | POA: Diagnosis not present

## 2024-02-08 DIAGNOSIS — D631 Anemia in chronic kidney disease: Secondary | ICD-10-CM | POA: Diagnosis not present

## 2024-02-08 DIAGNOSIS — M5459 Other low back pain: Secondary | ICD-10-CM | POA: Insufficient documentation

## 2024-02-08 DIAGNOSIS — I714 Abdominal aortic aneurysm, without rupture, unspecified: Secondary | ICD-10-CM | POA: Diagnosis not present

## 2024-02-08 DIAGNOSIS — E782 Mixed hyperlipidemia: Secondary | ICD-10-CM | POA: Diagnosis not present

## 2024-02-08 DIAGNOSIS — K31811 Angiodysplasia of stomach and duodenum with bleeding: Secondary | ICD-10-CM | POA: Diagnosis not present

## 2024-02-08 DIAGNOSIS — D500 Iron deficiency anemia secondary to blood loss (chronic): Secondary | ICD-10-CM

## 2024-02-08 DIAGNOSIS — E119 Type 2 diabetes mellitus without complications: Secondary | ICD-10-CM | POA: Diagnosis not present

## 2024-02-08 DIAGNOSIS — Z23 Encounter for immunization: Secondary | ICD-10-CM | POA: Diagnosis not present

## 2024-02-08 DIAGNOSIS — N1831 Chronic kidney disease, stage 3a: Secondary | ICD-10-CM | POA: Diagnosis not present

## 2024-02-08 DIAGNOSIS — G3189 Other specified degenerative diseases of nervous system: Secondary | ICD-10-CM | POA: Diagnosis not present

## 2024-02-08 NOTE — Therapy (Signed)
 OUTPATIENT PHYSICAL THERAPY THORACOLUMBAR TREATMENT    Patient Name: Lacey Vega MRN: [id number] DOB:[birth year], 73 y.o., female Today's Date: 02/08/2024  END OF SESSION:  PT End of Session - 02/08/24 1241     Visit Number 5    Date for PT Re-Evaluation 03/22/24    Authorization Type [employer]    Authorization Time Period 01/12/24 to 03/22/24    Progress Note Due on Visit 10    PT Start Time 0932    PT Stop Time 1015    PT Time Calculation (min) 43 min    Activity Tolerance Patient tolerated treatment well    Behavior During Therapy [facility] for tasks assessed/performed            Past Medical History:  Diagnosis Date   Anemia    Anxiety    Asthma    Asthma    Bronchitis    hx   Colon polyps    hyperplastic   Diabetes (HCC)    mild, history of   DJD (degenerative joint disease), cervical    Dysphagia    history of   Dyspnea    Gallstones    GERD (gastroesophageal reflux disease)    H/O chest pain    secondary to anemia   Headache    History of arteriovenous malformation (AVM)    History of blood transfusion 10/2021   History of hiatal hernia    History of tobacco use    Hyperlipemia    Hypertension    Hypothyroidism    Iron  deficiency anemia    Sickle cell anemia (HCC)    patient is not aware of this   Thyroid  disease    TIA (transient ischemic attack)    was told that she possible had one   Wears dentures    Wears glasses    Past Surgical History:  Procedure Laterality Date   ABDOMINAL AORTIC ENDOVASCULAR STENT GRAFT Bilateral 01/31/2021   Procedure: ENDOVASCULAR ANEURYSM REPAIR (EVAR)Bilateral Groin Cutdown, left femoral endaterectomy with bovine patch angioplasty.;  Surgeon: Magda Debby SAILOR, MD;  Location: [facility] OR;  Service: Vascular;  Laterality: Bilateral;   ABDOMINAL HYSTERECTOMY     ANTERIOR CERVICAL DECOMP/DISCECTOMY FUSION N/A 10/03/2016   Procedure: ANTERIOR CERVICAL DECOMPRESSION/DISCECTOMY FUSION CERVICAL THREE- CERVICAL FOUR;   Surgeon: Gillie Duncans, MD;  Location: [facility] OR;  Service: Neurosurgery;  Laterality: N/A;  Right side approach   BOTOX  INJECTION  08/29/2015   Procedure: BOTOX  INJECTION;  Surgeon: Jerrell Sol, MD;  Location: [facility] ENDOSCOPY;  Service: Endoscopy;;   CHOLECYSTECTOMY     COLONOSCOPY  12/03/2011   Procedure: COLONOSCOPY;  Surgeon: Jerrell KYM Sol, MD;  Location: [facility] ENDOSCOPY;  Service: Endoscopy;  Laterality: N/A;   COLONOSCOPY N/A 11/27/2021   Procedure: COLONOSCOPY;  Surgeon: Federico Rosario BROCKS, MD;  Location: [facility];  Service: Gastroenterology;  Laterality: N/A;   ENTEROSCOPY N/A 07/07/2021   Procedure: ENTEROSCOPY;  Surgeon: Rosalie Kitchens, MD;  Location: [facility] ENDOSCOPY;  Service: Endoscopy;  Laterality: N/A;   ENTEROSCOPY N/A 08/06/2021   Procedure: ENTEROSCOPY;  Surgeon: Saintclair Jasper, MD;  Location: [facility] ENDOSCOPY;  Service: Gastroenterology;  Laterality: N/A;   ENTEROSCOPY N/A 11/27/2021   Procedure: SMALL  BOWEL ENTEROSCOPY;  Surgeon: Federico Rosario BROCKS, MD;  Location: [facility] ENDOSCOPY;  Service: Gastroenterology;  Laterality: N/A;   ENTEROSCOPY N/A 11/07/2022   Procedure: ENTEROSCOPY;  Surgeon: Charlanne Groom, MD;  Location: [facility] ENDOSCOPY;  Service: Gastroenterology;  Laterality: N/A;   ENTEROSCOPY N/A 04/03/2023   Procedure: ENTEROSCOPY;  Surgeon: Avram Lupita BRAVO, MD;  Location: [facility] ENDOSCOPY;  Service: Gastroenterology;  Laterality: N/A;   ENTEROSCOPY N/A 12/05/2023   Procedure: ENTEROSCOPY;  Surgeon: Federico Rosario BROCKS, MD;  Location: [facility] ENDOSCOPY;  Service: Gastroenterology;  Laterality: N/A;   ESOPHAGEAL MANOMETRY N/A 07/13/2017   Procedure: ESOPHAGEAL MANOMETRY (EM);  Surgeon: Dianna Specking, MD;  Location: [facility] ENDOSCOPY;  Service: Endoscopy;  Laterality: N/A;   ESOPHAGOGASTRODUODENOSCOPY  12/03/2011   Procedure: ESOPHAGOGASTRODUODENOSCOPY (EGD);  Surgeon: Specking KYM Dianna, MD;  Location: [facility];  Service: Endoscopy;  Laterality: N/A;   ESOPHAGOGASTRODUODENOSCOPY (EGD) WITH PROPOFOL  N/A 08/29/2015    Procedure: ESOPHAGOGASTRODUODENOSCOPY (EGD) WITH PROPOFOL ;  Surgeon: Specking Dianna, MD;  Location: [facility] ENDOSCOPY;  Service: Endoscopy;  Laterality: N/A;   ESOPHAGOGASTRODUODENOSCOPY (EGD) WITH PROPOFOL  N/A 05/17/2019   Procedure: ESOPHAGOGASTRODUODENOSCOPY (EGD) WITH PROPOFOL ;  Surgeon: Dianna Specking, MD;  Location: [facility] ENDOSCOPY;  Service: Endoscopy;  Laterality: N/A;   ESOPHAGOGASTRODUODENOSCOPY (EGD) WITH PROPOFOL  N/A 04/24/2021   Procedure: ESOPHAGOGASTRODUODENOSCOPY (EGD) WITH PROPOFOL ;  Surgeon: Elicia Claw, MD;  Location: [facility] ENDOSCOPY;  Service: Gastroenterology;  Laterality: N/A;   GIVENS CAPSULE STUDY N/A 08/05/2018   Procedure: GIVENS CAPSULE STUDY;  Surgeon: Dianna Specking, MD;  Location: [facility] ENDOSCOPY;  Service: Endoscopy;  Laterality: N/A;   GIVENS CAPSULE STUDY N/A 07/06/2021   Procedure: GIVENS CAPSULE STUDY;  Surgeon: Rosalie Kitchens, MD;  Location: [facility] ENDOSCOPY;  Service: Endoscopy;  Laterality: N/A;   HOT HEMOSTASIS  12/03/2011   Procedure: HOT HEMOSTASIS (ARGON PLASMA COAGULATION/BICAP);  Surgeon: Specking KYM Dianna, MD;  Location: [facility];  Service: Endoscopy;  Laterality: N/A;   HOT HEMOSTASIS N/A 07/07/2021   Procedure: HOT HEMOSTASIS (ARGON PLASMA COAGULATION/BICAP);  Surgeon: Rosalie Kitchens, MD;  Location: [facility];  Service: Endoscopy;  Laterality: N/A;   HOT HEMOSTASIS N/A 08/06/2021   Procedure: HOT HEMOSTASIS (ARGON PLASMA COAGULATION/BICAP);  Surgeon: Saintclair Jasper, MD;  Location: [facility];  Service: Gastroenterology;  Laterality: N/A;   HOT HEMOSTASIS  11/27/2021   Procedure: HOT HEMOSTASIS (ARGON PLASMA COAGULATION/BICAP);  Surgeon: Federico Rosario BROCKS, MD;  Location: [facility];  Service: Gastroenterology;;  EGD and COLON   HOT HEMOSTASIS N/A 11/07/2022   Procedure: HOT HEMOSTASIS (ARGON PLASMA COAGULATION/BICAP);  Surgeon: Charlanne Groom, MD;  Location: [facility];  Service: Gastroenterology;  Laterality: N/A;   HOT HEMOSTASIS N/A 04/03/2023   Procedure: HOT  HEMOSTASIS (ARGON PLASMA COAGULATION/BICAP);  Surgeon: Avram Lupita BRAVO, MD;  Location: [facility];  Service: Gastroenterology;  Laterality: N/A;   HOT HEMOSTASIS N/A 12/05/2023   Procedure: EGD, WITH ARGON PLASMA COAGULATION;  Surgeon: Federico Rosario BROCKS, MD;  Location: [facility] ENDOSCOPY;  Service: Gastroenterology;  Laterality: N/A;   NECK SURGERY     PERIPHERAL VASCULAR INTERVENTION  03/29/2021   Procedure: PERIPHERAL VASCULAR INTERVENTION;  Surgeon: Magda Debby SAILOR, MD;  Location: [facility] INVASIVE CV LAB;  Service: Cardiovascular;;  Lt SFA Brachial Approach   POLYPECTOMY  11/27/2021   Procedure: POLYPECTOMY;  Surgeon: Federico Rosario BROCKS, MD;  Location: [facility];  Service: Gastroenterology;;   ROTATOR CUFF REPAIR     right   ROTATOR CUFF REPAIR Right 2014   THROMBECTOMY FEMORAL ARTERY Left 01/31/2021   Procedure: THROMBECTOMY POPLITEAL  ARTERY;  Surgeon: Magda Debby SAILOR, MD;  Location: [facility] OR;  Service: Vascular;  Laterality: Left;   TONSILLECTOMY     Patient Active Problem List   Diagnosis Date Noted   Upper GI bleed 12/04/2023   History of angiodysplasia of intestinal tract 12/04/2023   Chronic kidney disease, stage 3b (HCC) 12/04/2023   Chest pain 12/04/2023   Hypocalcemia 12/04/2023  Thrombocytopenia (HCC) 12/04/2023   Cognitive impairment 12/04/2023   GERD (gastroesophageal reflux disease) 12/04/2023   Obesity (BMI 30-39.9) 12/04/2023   Melena 04/02/2023   Symptomatic anemia 04/02/2023   Angiodysplasia of stomach and duodenum 04/02/2023   GI bleed 04/01/2023   Chronic kidney disease, stage 3a (HCC) 07/05/2021   Iron  deficiency anemia due to chronic blood loss 05/09/2021   Acute on chronic blood loss anemia 04/22/2021   AKI (acute kidney injury) (HCC) 04/22/2021   Epistaxis 04/22/2021   PAD (peripheral artery disease) (HCC) 04/22/2021   HLD (hyperlipidemia) 04/22/2021   Sickle cell trait (HCC) 04/22/2021   AAA (abdominal aortic aneurysm) 02/01/2021   Aortic aneurysm (HCC) 01/31/2021    Achalasia 10/11/2017   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   DJD (degenerative joint disease), cervical    Iron  deficiency anemia    History of arteriovenous malformation (AVM)    History of tobacco use    Asthma    H/O chest pain    Diabetes (HCC)    Dysphagia    Iron  deficiency anemia, unspecified 12/03/2011   Hypothyroidism (acquired) 10/07/2010    Class: Chronic   DYSPHAGIA UNSPECIFIED 10/05/2009   ANEMIA 08/20/2009   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   DM 04/17/2009   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/05/1990    Class: Chronic    PCP: Addie Locus MD   REFERRING PROVIDER: Gillie Duncans, MD  REFERRING DIAG: Diagnosis M54.40 (ICD-10-CM) - Lumbago with sciatica, unspecified side  Rationale for Evaluation and Treatment: Rehabilitation  THERAPY DIAG:  Other low back pain  Other symptoms and signs involving the musculoskeletal system  Muscle weakness (generalized)  ONSET DATE: MD order 12/23/23  SUBJECTIVE:                                                                                                                                                                                           SUBJECTIVE STATEMENT:   The patient reports feels good after PT and the exercise for a few days, then in pain again.  Worse after she gets up in am and starts moving around with preparing breakfast, etc. Relieves by bending forward through waist and hips   EVAL: Not sure what's happening with my back, it just hurts. When my blood gets low in the past my back hurt along with hit, when I got blood my back felt better. This time the pain stayed, its the lower part of my back. I'm OK as long as I'm sitting down but when I get up and move around it bothers me. Leaning forward takes pressure off my back.   PERTINENT HISTORY:  See above   PAIN:  Are you having pain? No I'm fine no number given on NPRS   PRECAUTIONS: None  RED FLAGS: None   WEIGHT  BEARING RESTRICTIONS: No  FALLS:  Has patient fallen in last 6 months? 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  DIAGNOSTIC FINDINGS:   Narrative & Impression  MR LUMBAR SPINE WITHOUT IV CONTRAST   COMPARISON: None available   CLINICAL HISTORY: None available   TECHNIQUE: SAG T2, SAG T1, SAG STIR, AX T2, AX T1 without IV contrast.   FINDINGS: There is normal alignment of the lumbar spine. Mild multilevel disc desiccation and mild-to-moderate facet arthrosis. There is no vertebral body height loss, subluxation or marrow replacing process. The sacrum and SI joints are unremarkable so far as visualized. Conus and cauda equina are unremarkable.   T12-L1: There is no focal disc protrusion, foraminal or spinal stenosis.   L1-2: There is no focal disc protrusion, foraminal or spinal stenosis. Mild facet arthrosis.   L2-3: Mild broad-based bulge slightly effacing the ventral thecal sac. Moderate facet arthrosis bilaterally. There is caudal foraminal narrowing, left slightly greater than right. No impingement of the descending nerve roots or exiting nerves.   L3-4: Mild disc desiccation moderate facet arthrosis. No significant foraminal or spinal stenosis.   L4-5: Broad-based disc osteophyte moderate facet arthrosis. No significant foraminal or spinal stenosis.   L5-S1: There is a transitional vertebral level on the left with fusion of the L5 vertebral body in the S1 vertebral body best  seen on sagittal image 14 and 15. No significant foraminal or spinal stenosis. Mild facet arthrosis is   There is a large infrarenal abdominal aortic aneurysm measuring 5.4 cm in AP dimension, 5.5 cm in transverse dimension and 10 cm in length. There is an endoluminal stent graft in place. Size of the native aneurysm is relatively stable when compared with the prior CTA.   IMPRESSION: Mild degenerative disc desiccation facet arthrosis. No significant foraminal or spinal stenosis. Mild multilevel facet arthrosis most notably on the right at L3-4.    PATIENT SURVEYS:  PSFS: THE PATIENT SPECIFIC FUNCTIONAL SCALE  Place score of 0-10 (0 = unable to perform activity and 10 = able to perform activity at the same level as before injury or problem)  Activity Date: 01/12/24    Standing up for long periods  0    2. Walking for long periods  0    3.     4.      Total Score 0      Total Score = Sum of activity scores/number of activities  Minimally Detectable Change: 3 points (for single activity); 2 points (for average score)  [facility] (nd). The Patient Specific Functional Scale . Retrieved from [email or web address]   COGNITION: Overall cognitive status: Within functional limits for tasks assessed     SENSATION: [facility] Not tested  MUSCLE LENGTH:  Unable to assess, pt resisted passive movements for mm flexibility checks   POSTURE: rounded shoulders, forward head, increased lumbar lordosis, and  decreased thoracic kyphosis   LUMBAR ROM:   AROM eval 8/292/5  Flexion 50% limited  50% limited  Extension 75% limited  50% limited  Right lateral flexion 75% limited  50% limited  Left lateral flexion 75% limited  50% limited  Right rotation 50% limited    Left rotation 50% limited     (Blank rows = not tested)    LOWER EXTREMITY MMT:    MMT Right eval Left eval RT/Left 01/29/24  Hip flexion 3- 3- 3/3+  Hip  extension     Hip abduction     Hip adduction     Hip internal rotation     Hip external rotation     Knee flexion 4 4   Knee extension 4 4   Ankle dorsiflexion     Ankle plantarflexion     Ankle inversion     Ankle eversion      (Blank rows = not tested)    TREATMENT DATE:  02/08/24: Instructed in seated lumbar flexion, rotation, B hands on cane , trial after she awakens to improve lumbar motion prior to standing, walking activities Inst in standing partial lunge with ant hip jt stretch, alt sides 10 to 15 sec hold Nustep L5 x 7 min Ue's and LE's Seated red t band rows 15 x Seated alt shoulder ext with red t band 15 x  Standing alt hamstring curls with replication of pain lower back so dc'd Marching with B hands supported on ll bars with 1 1/2# cuff wts 15x each Seated hip abd with blue t band around knees 15x  01/29/24 Nustep L 6 Checked Goals and ROM and MMT Blue tband trunk ext 2 sets 10 Isometric abdominal squeeze 2 sets 10 with ball Standing red tband row and shld ext 2 sets 10 STS yellow ball chest press 10 x Red tband standing SL hip flex,ext and abd 10 x each ADDED to HEP -see below  01/21/24  Nustep L2 x8 minutes seat 9, all four extremities Goal check/added LTG for balance    Seated TA set 15x3 seconds  Seated TA set + march x15 B Hip hike x10 B LAQs green TB x10 B (alternating)  HS green TB x10 B (alternating) Seated hip ABD green TB x15  One step on 4 inch step/other on floor 3x30 seconds B  Narrow BOS on blue foam circle 2x30 seconds   01/14/24  Shoulder AAROM 1lb WaTE Flex, Ext, IR x10 Education on diagnosis HS curls red 2x10 LAQ 2lb 2x10 Seated Rows red 2x10 Seated March 2x10  Hip add ball squeeze2x10 Hip abd green 2x10 Shoulder Ext red 2x10  NuStep L4 x 4 min  01/12/24  Eval, POC, HEP practice and education as below  Nustep L1x6 minutes seat 9 all four extremities  PATIENT EDUCATION:  Education details: exam findings, POC, HEP  Person educated: Patient Education method: Explanation, Demonstration, and Handouts Education comprehension: verbalized understanding, returned demonstration, and needs further education  HOME EXERCISE PROGRAM:  Access Code: [email or web address] URL: [email or web address] Date: 01/29/2024 Prepared by: Angela Payseur  Exercises - Standing Hip Flexion with Resistance Loop  - 1 x daily - 7 x weekly - 1 sets - 10 reps - Hip Abduction with Resistance Loop  - 1 x daily - 7 x weekly - 1 sets - 10 reps - Hip Extension with Resistance Loop  - 1 x daily - 7 x weekly - 1 sets - 10 reps - Sit to Stand  - 1 x daily - 7 x weekly - 1 sets - 10 reps - Seated Hip Flexion  - 1 x daily - 7 x weekly - 2 sets - 10 reps - Seated Long Arc Quad  - 1 x daily - 7 x weekly - 2 sets - 10 reps - Seated Hip Abduction with Resistance  - 1 x daily - 7 x weekly - 2 sets - 10 reps  Access Code: [email or web address] URL: [email or web address] Date: 01/21/2024 Prepared by: Josette Rough  Exercises - Seated Flexion Stretch  - 2 x daily - 7 x weekly - 1 sets - 10 reps - 3 seconds  hold - Seated Sidebending  - 2 x daily - 7 x weekly - 1 sets - 10 reps - 3 seconds  hold - Seated Trunk Rotation - Arms Crossed  - 2 x daily - 7 x weekly - 1 sets - 10 reps - 3 seconds  hold - Standing Transverse Abdominis Contraction  - 2 x daily - 7 x weekly - 1 sets - 15 reps - 3 seconds  hold - Seated Transversus Abdominus Bracing + March  - 2 x daily - 7 x weekly - 1 sets - 10 reps - Standing Hip Hiking  - 2 x daily - 7 x weekly - 1 sets - 10 reps  ASSESSMENT:  CLINICAL IMPRESSION: Education throughout session regarding anatomy of spine, arthritis , specific positioning, activities to improve lumbar spinal joint nutrition/ mobility prior to performing activities requiring prolonged standing and  walking. She tolerated most exercises well, would benefit from additional education   OBJECTIVE IMPAIRMENTS: Abnormal gait, decreased activity tolerance, decreased mobility, difficulty walking, decreased ROM, decreased strength, hypomobility, increased fascial restrictions, increased muscle spasms, impaired flexibility, obesity, and pain.   ACTIVITY LIMITATIONS: carrying, lifting, standing, squatting, stairs, transfers, hygiene/grooming, and locomotion level  PARTICIPATION LIMITATIONS: meal prep, cleaning, laundry, driving, shopping, community activity, and yard work  PERSONAL FACTORS: Age, Behavior pattern, Education, Fitness, and Past/current experiences are also affecting patient's functional outcome.   REHAB POTENTIAL: Fair chronicity of pain, low health literacy   CLINICAL DECISION MAKING: Stable/uncomplicated  EVALUATION COMPLEXITY: Low   GOALS: Goals reviewed with patient? No  SHORT TERM GOALS: Target date: 02/16/2024    Will be compliant with appropriate progressive HEP  Baseline:// Goal status: 01/29/24 MET  2.  Lumbar AROM to be no more than 25% limited all planes  Baseline:  Goal status: progressing 01/29/24  3.  Back pain to be no more than 7/10 at worst  Baseline:  Goal status: MET 01/26/24  4.  Will demonstrate improved awareness of functional posture  Baseline:  Goal status: ONGOING 01/21/24  and 01/29/24    LONG TERM GOALS: Target date: 03/22/2024    MMT to have improved by at least 1 grade all weak groups  Baseline:  Goal status: INITIAL  2.  Will be able to stand and cook/perform house work as desired for at least 45 minutes before back pain increases  Baseline:  Goal status: INITIAL  3.  Will be able to complete a grocery shopping trip of 30-45 minutes in duration before back pain increases  Baseline:  Goal status: INITIAL  4.  Back pain to be no more than 5/10 at worst  Baseline:  Goal status: INITIAL  5.  PSFS to have improved by at least 2  points  Baseline:  Goal status: INITIAL  6. Will score at least 48 on Berg to show improved functional balance  Goal status: NEW 01/21/24    PLAN:  PT FREQUENCY: 2x/week  PT DURATION: 10 weeks  PLANNED INTERVENTIONS: 97750- Physical Performance Testing, 97110-Therapeutic exercises, 97530- Therapeutic activity, 97112- Neuromuscular re-education, 97535- Self Care, [id number]- Manual therapy, [id number]- Gait training, and [id number]- Aquatic Therapy.  PLAN FOR NEXT SESSION: Can't tolerate laying supine.  Continue to progress strength and balance.   Greig Credit, PT, DPT, OCS 02/08/24 5:22 PM

## 2024-02-09 ENCOUNTER — Inpatient Hospital Stay (HOSPITAL_BASED_OUTPATIENT_CLINIC_OR_DEPARTMENT_OTHER): Admitting: Physician Assistant

## 2024-02-09 ENCOUNTER — Inpatient Hospital Stay: Attending: Hematology and Oncology

## 2024-02-09 ENCOUNTER — Ambulatory Visit: Payer: Self-pay | Admitting: Physician Assistant

## 2024-02-09 ENCOUNTER — Other Ambulatory Visit: Payer: Self-pay | Admitting: Physician Assistant

## 2024-02-09 VITALS — BP 126/56 | HR 71 | Temp 97.7°F | Resp 16 | Ht 64.0 in | Wt 195.0 lb

## 2024-02-09 DIAGNOSIS — Z87891 Personal history of nicotine dependence: Secondary | ICD-10-CM | POA: Insufficient documentation

## 2024-02-09 DIAGNOSIS — K31811 Angiodysplasia of stomach and duodenum with bleeding: Secondary | ICD-10-CM | POA: Diagnosis not present

## 2024-02-09 DIAGNOSIS — D571 Sickle-cell disease without crisis: Secondary | ICD-10-CM | POA: Diagnosis not present

## 2024-02-09 DIAGNOSIS — D500 Iron deficiency anemia secondary to blood loss (chronic): Secondary | ICD-10-CM | POA: Insufficient documentation

## 2024-02-09 DIAGNOSIS — Z860100 Personal history of colon polyps, unspecified: Secondary | ICD-10-CM | POA: Diagnosis not present

## 2024-02-09 LAB — CMP (CANCER CENTER ONLY)
ALT: 7 U/L (ref 0–44)
AST: 12 U/L — ABNORMAL LOW (ref 15–41)
Albumin: 4.1 g/dL (ref 3.5–5.0)
Alkaline Phosphatase: 142 U/L — ABNORMAL HIGH (ref 38–126)
Anion gap: 6 (ref 5–15)
BUN: 14 mg/dL (ref 8–23)
CO2: 28 mmol/L (ref 22–32)
Calcium: 8.9 mg/dL (ref 8.9–10.3)
Chloride: 109 mmol/L (ref 98–111)
Creatinine: 1.27 mg/dL — ABNORMAL HIGH (ref 0.44–1.00)
GFR, Estimated: 45 mL/min — ABNORMAL LOW (ref >60)
Glucose, Bld: 101 mg/dL — ABNORMAL HIGH (ref 70–99)
Potassium: 4.2 mmol/L (ref 3.5–5.1)
Sodium: 143 mmol/L (ref 135–145)
Total Bilirubin: 0.8 mg/dL (ref 0.0–1.2)
Total Protein: 6.9 g/dL (ref 6.5–8.1)

## 2024-02-09 LAB — CBC WITH DIFFERENTIAL (CANCER CENTER ONLY)
Abs Immature Granulocytes: 0.01 K/uL (ref 0.00–0.07)
Basophils Absolute: 0.1 K/uL (ref 0.0–0.1)
Basophils Relative: 1 %
Eosinophils Absolute: 0.3 K/uL (ref 0.0–0.5)
Eosinophils Relative: 5 %
HCT: 27.7 % — ABNORMAL LOW (ref 36.0–46.0)
Hemoglobin: 8.4 g/dL — ABNORMAL LOW (ref 12.0–15.0)
Immature Granulocytes: 0 %
Lymphocytes Relative: 14 %
Lymphs Abs: 0.7 K/uL (ref 0.7–4.0)
MCH: 24.3 pg — ABNORMAL LOW (ref 26.0–34.0)
MCHC: 30.3 g/dL (ref 30.0–36.0)
MCV: 80.3 fL (ref 80.0–100.0)
Monocytes Absolute: 0.6 K/uL (ref 0.1–1.0)
Monocytes Relative: 11 %
Neutro Abs: 3.6 K/uL (ref 1.7–7.7)
Neutrophils Relative %: 69 %
Platelet Count: 273 K/uL (ref 150–400)
RBC: 3.45 MIL/uL — ABNORMAL LOW (ref 3.87–5.11)
RDW: 15.5 % (ref 11.5–15.5)
WBC Count: 5.2 K/uL (ref 4.0–10.5)
nRBC: 0 % (ref 0.0–0.2)

## 2024-02-09 LAB — SAMPLE TO BLOOD BANK

## 2024-02-09 LAB — IRON AND IRON BINDING CAPACITY (CC-WL,HP ONLY)
Iron: 41 ug/dL (ref 28–170)
Saturation Ratios: 10 % — ABNORMAL LOW (ref 10.4–31.8)
TIBC: 431 ug/dL (ref 250–450)
UIBC: 390 ug/dL (ref 148–442)

## 2024-02-09 LAB — FERRITIN: Ferritin: 31 ng/mL (ref 11–307)

## 2024-02-09 NOTE — Progress Notes (Signed)
 [facility] Telephone:([phone]   [phone]  PROGRESS NOTE  Patient Care Team: Addie Camellia CROME, MD as PCP - General (Internal Medicine)  Hematological/Oncological History # Iron  Deficiency Anemia due to Chronic Blood Loss # Sickle Cell Trait  1) 03/26/2009: Hgb 7.2, first Hgb on record 2) 09/02/2011: 2 units of PRBC at [facility] (Hgb noted at 8.9).  3)  12/03/2011: colonoscopy, found colon polyp and sigmoid diverticula. 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 Underwent argon plasma coagulation  4) Jan 2018: colonoscopy with 2 tubular adenomas removed. Small colonic AVM and small internal hemorrhoids.  5)10/24/2017: Hgb 7.7, MCV 71, Plt 338, and WBC 6.7. POEM procedure for Achalasia. duodenal AVM fulgurated.  6) 03/2018: Hgb 8.2.  7) March 2020: capsule endoscopy: nonbleeding gastric and proximal small bowel AVM,  8) 04/26/2019: Establish Care with Dr. Federico  WBC 6.1, Hgb 9.3, MCV 75.3, Plt 289 9) 05/17/2019: Underwent EGD which showed esophagitis and small non-bleeding duodenal angiodysplasias. 10) 07/27/2019: WBC 5.9, Hgb 9.8, MCV 77.8, Plt 271. Recommend IV iron  based on labs. Received feraheme  510mg  IV q7 days x 2 doses on 3/8 and 08/15/2019. Hgb electrophoresis confirms sickle cell trait.  11) 10/24/2019: WBC 5.4, Hgb 10.2, MCV 80.5, Plt 252 12) 02/15/2020: WBC 5.1, Hgb 9.7, MCV 80.2, Plt 280 13) 9/24-10/06/2019: IV feraheme  510mg  x 2 doses 14) 04/22/2021: Patient presented to emergency department with heavy nosebleeding.  Had a syncopal episode in the emergency department.  Discharged on 04/24/2021. Hgb 7.2 on admission.  15) 07/05/2021: presented to clinic with Hgb 6.2, MCV 88.9 and Plt 286. Admitted for GI workup.  16) 11/27/2021: colonscopy showed a single small localized angioectasia which was ablated. Small bowel endoscopy showed 5 angioectasias treated with APC  Interval History:  Lacey Vega 73 y.o. female with medical history  significant for iron  deficiency anemia 2/2 to chronic blood loss/ sickle cell trait who presents for a follow up visit. The patient's last visit was on 11/16/2023. In the interim, she was admitted from 12/04/2023 to 12/07/2023 for acute GI bleed with anemia.  She is unaccompanied for this visit.  On exam today Lacey Vega she is slowly improving with her energy since hospital discharge.  She is struggling with low back pain and recently started physical therapy.  She any overt signs of bleeding including hematochezia or melena.  is craving ice more frequently.  She denies patient reports stable shortness of breath with exertion.  She denies fevers, chills, night sweats, chest pain, cough, nausea, vomiting, bowel habit changes, headaches or dizziness.  She has no other complaints.A full 10 point ROS is otherwise negative.  MEDICAL HISTORY:  Past Medical History:  Diagnosis Date   Anemia    Anxiety    Asthma    Asthma    Bronchitis    hx   Colon polyps    hyperplastic   Diabetes (HCC)    mild, history of   DJD (degenerative joint disease), cervical    Dysphagia    history of   Dyspnea    Gallstones    GERD (gastroesophageal reflux disease)    H/O chest pain    secondary to anemia   Headache    History of arteriovenous malformation (AVM)    History of blood transfusion 10/2021   History of hiatal hernia    History of tobacco use    Hyperlipemia    Hypertension    Hypothyroidism    Iron  deficiency anemia  Sickle cell anemia (HCC)    patient is not aware of this   Thyroid  disease    TIA (transient ischemic attack)    was told that she possible had one   Wears dentures    Wears glasses     SURGICAL HISTORY: Past Surgical History:  Procedure Laterality Date   ABDOMINAL AORTIC ENDOVASCULAR STENT GRAFT Bilateral 01/31/2021   Procedure: ENDOVASCULAR ANEURYSM REPAIR (EVAR)Bilateral Groin Cutdown, left femoral endaterectomy with bovine patch angioplasty.;  Surgeon: Magda Debby SAILOR,  MD;  Location: [facility] OR;  Service: Vascular;  Laterality: Bilateral;   ABDOMINAL HYSTERECTOMY     ANTERIOR CERVICAL DECOMP/DISCECTOMY FUSION N/A 10/03/2016   Procedure: ANTERIOR CERVICAL DECOMPRESSION/DISCECTOMY FUSION CERVICAL THREE- CERVICAL FOUR;  Surgeon: Gillie Duncans, MD;  Location: [facility] OR;  Service: Neurosurgery;  Laterality: N/A;  Right side approach   BOTOX  INJECTION  08/29/2015   Procedure: BOTOX  INJECTION;  Surgeon: Jerrell Sol, MD;  Location: [facility] ENDOSCOPY;  Service: Endoscopy;;   CHOLECYSTECTOMY     COLONOSCOPY  12/03/2011   Procedure: COLONOSCOPY;  Surgeon: Jerrell KYM Sol, MD;  Location: [facility] ENDOSCOPY;  Service: Endoscopy;  Laterality: N/A;   COLONOSCOPY N/A 11/27/2021   Procedure: COLONOSCOPY;  Surgeon: Federico Rosario BROCKS, MD;  Location: [facility];  Service: Gastroenterology;  Laterality: N/A;   ENTEROSCOPY N/A 07/07/2021   Procedure: ENTEROSCOPY;  Surgeon: Rosalie Kitchens, MD;  Location: [facility] ENDOS[facility];  Service: Endoscopy;  Laterality: N/A;   ENTEROSCOPY N/A 08/06/2021   Procedure: ENTEROSCOPY;  Surgeon: Saintclair Jasper, MD;  Location: [facility] ENDOSCOPY;  Service: Gastroenterology;  Laterality: N/A;   ENTEROSCOPY N/A 11/27/2021   Procedure: SMALL  BOWEL ENTEROSCOPY;  Surgeon: Federico Rosario BROCKS, MD;  Location: [facility] END[facility]COPY;  Service: Gastroenterology;  Laterality: N/A;   ENTEROSCOPY N/A 11/07/2022   Procedure: ENTEROSCOPY;  Surgeon: Charlanne Groom, MD;  Location: [facility] ENDOSCOPY;  Service: Gastroenterology;  Laterality: N/A;   ENTEROSCOPY N/A 04/03/2023   Procedure: ENTEROSCOPY;  Surgeon: Avram Lupita BRAVO, MD;  Location: [facility];  Service: Gastroenterology;  Laterality: N/A;   ENTEROSCOPY N/A 12/05/2023   Procedure: ENTEROSCOPY;  Surgeon: Federico Rosario BROCKS, MD;  Location: [facility] ENDOSCOPY;  Service: Gastroenterology;  Laterality: N/A;   ESOPHAGEAL MANOMETRY N/A 07/13/2017   Procedure: ESOPHAGEAL MANOMETRY (EM);  Surgeon: Sol Jerrell, MD;  Location: [facility];  Service: Endoscopy;  Laterality: N/A;    ESOPHAGOGASTRODUODENOSCOPY  12/03/2011   Procedure: ESOPHAGOGASTRODUODENOSCOPY (EGD);  Surgeon: Jerrell KYM Sol, MD;  Location: [facility];  Service: Endoscopy;  Laterality: N/A;   ESOPHAGOGASTRODUODENOSCOPY (EGD) WITH PROPOFOL  N/A 08/29/2015   Procedure: ESOPHAGOGASTRODUODENOSCOPY (EGD) WITH PROPOFOL ;  Surgeon: Jerrell Sol, MD;  Location: [facility] ENDOSCOPY;  Service: Endoscopy;  Laterality: N/A;   ESOPHAGOGASTRODUODENOSCOPY (EGD) WITH PROPOFOL  N/A 05/17/2019   Procedure: ESOPHAGOGASTRODUODENOSCOPY (EGD) WITH PROPOFOL ;  Surgeon: Sol Jerrell, MD;  Location: [facility];  Service: Endoscopy;  Laterality: N/A;   ESOPHAGOGASTRODUODENOSCOPY (EGD) WITH PROPOFOL  N/A 04/24/2021   Procedure: ESOPHAGOGASTRODUODENOSCOPY (EGD) WITH PROPOFOL ;  Surgeon: Elicia Claw, MD;  Location: [facility] ENDOSCOPY;  Service: Gastroenterology;  Laterality: N/A;   GIVENS CAPSULE STUDY N/A 08/05/2018   Procedure: GIVENS CAPSULE STUDY;  Surgeon: Sol Jerrell, MD;  Location: [facility] ENDOSCOPY;  Service: Endoscopy;  Laterality: N/A;   GIVENS CAPSULE STUDY N/A 07/06/2021   Procedure: GIVENS CAPSULE STUDY;  Surgeon: Rosalie Kitchens, MD;  Location: [facility];  Service: Endoscopy;  Laterality: N/A;   HOT HEMOSTASIS  12/03/2011   Procedure: HOT HEMOSTASIS (ARGON PLASMA COAGULATION/BICAP);  Surgeon: Jerrell KYM Sol, MD;  Location: [facility];  Service: Endoscopy;  Laterality: N/A;  HOT HEMOSTASIS N/A 07/07/2021   Procedure: HOT HEMOSTASIS (ARGON PLASMA COAGULATION/BICAP);  Surgeon: Rosalie Kitchens, MD;  Location: [facility];  Service: Endoscopy;  Laterality: N/A;   HOT HEMOSTASIS N/A 08/06/2021   Procedure: HOT HEMOSTASIS (ARGON PLASMA COAGULATION/BICAP);  Surgeon: Saintclair Jasper, MD;  Location: [facility];  Service: Gastroenterology;  Laterality: N/A;   HOT HEMOSTASIS  11/27/2021   Procedure: HOT HEMOSTASIS (ARGON PLASMA COAGULATION/BICAP);  Surgeon: Federico Rosario BROCKS, MD;  Location: [facility];  Service: Gastroenterology;;  EGD and  COLON   HOT HEMOSTASIS N/A 11/07/2022   Procedure: HOT HEMOSTASIS (ARGON PLASMA COAGULATION/BICAP);  Surgeon: Charlanne Groom, MD;  Location: [facility];  Service: Gastroenterology;  Laterality: N/A;   HOT HEMOSTASIS N/A 04/03/2023   Procedure: HOT HEMOSTASIS (ARGON PLASMA COAGULATION/BICAP);  Surgeon: Avram Lupita BRAVO, MD;  Location: [facility];  Service: Gastroenterology;  Laterality: N/A;   HOT HEMOSTASIS N/A 12/05/2023   Procedure: EGD, WITH ARGON PLASMA COAGULATION;  Surgeon: Federico Rosario BROCKS, MD;  Location: [facility] ENDOSCOPY;  Service: Gastroenterology;  Laterality: N/A;   NECK SURGERY     PERIPHERAL VASCULAR INTERVENTION  03/29/2021   Procedure: PERIPHERAL VASCULAR INTERVENTION;  Surgeon: Magda Debby SAILOR, MD;  Location: [facility] INVASIVE CV LAB;  Service: Cardiovascular;;  Lt SFA Brachial Approach   POLYPECTOMY  11/27/2021   Procedure: POLYPECTOMY;  Surgeon: Federico Rosario BROCKS, MD;  Location: [facility];  Service: Gastroenterology;;   ROTATOR CUFF REPAIR     right   ROTATOR CUFF REPAIR Right 2014   THROMBECTOMY FEMORAL ARTERY Left 01/31/2021   Procedure: THROMBECTOMY POPLITEAL  ARTERY;  Surgeon: Magda Debby SAILOR, MD;  Location: [facility] OR;  Service: Vascular;  Laterality: Left;   TONSILLECTOMY      SOCIAL HISTORY: Social History   Socioeconomic History   Marital status: Divorced    Spouse name: Not on file   Number of children: 2   Years of education: Not on file   Highest education level: Not on file  Occupational History   Occupation: group leader environmental services    Employer: [employer]    Comment: [employer]  Tobacco Use   Smoking status: Former    Current packs/day: 0.00    Types: Cigarettes    Quit date: 06/02/2000    Years since quitting: 23.7   Smokeless tobacco: Never  Vaping Use   Vaping status: Never Used  Substance and Sexual Activity   Alcohol use: No   Drug use: No   Sexual activity: Not on file  Other Topics Concern   Not on file  Social History  Narrative   ** Merged History Encounter **       Social Drivers of Health   Financial Resource Strain: Not on file  Food Insecurity: No Food Insecurity (12/04/2023)   Hunger Vital Sign    Worried About Running Out of Food in the Last Year: Never true    Ran Out of Food in the Last Year: Never true  Transportation Needs: No Transportation Needs (12/06/2023)   PRAPARE - Administrator, Civil Service (Medical): No    Lack of Transportation (Non-Medical): No  Physical Activity: Not on file  Stress: Not on file  Social Connections: Unknown (12/06/2023)   Social Connection and Isolation Panel    Frequency of Communication with Friends and Family: Twice a week    Frequency of Social Gatherings with Friends and Family: Three times a week    Attends Religious Services: More than 4 times per year  Active Member of Clubs or Organizations: Yes    Attends Banker Meetings: More than 4 times per year    Marital Status: Patient unable to answer  Intimate Partner Violence: Not At Risk (12/06/2023)   Humiliation, Afraid, Rape, and Kick questionnaire    Fear of Current or Ex-Partner: No    Emotionally Abused: No    Physically Abused: No    Sexually Abused: No    FAMILY HISTORY: Family History  Problem Relation Age of Onset   Diabetes Mother    Heart attack Father    Heart attack Brother    Cancer Brother        type unknown   Cancer Brother        type unknown   Cancer Brother        type unknown   Cancer Maternal Aunt     ALLERGIES:  is allergic to oxycodone.  MEDICATIONS:  Current Outpatient Medications  Medication Sig Dispense Refill   albuterol  (VENTOLIN  HFA) 108 (90 Base) MCG/ACT inhaler Inhale 1-2 puffs into the lungs every 6 (six) hours as needed for wheezing or shortness of breath.     aspirin  EC 81 MG tablet Take 81 mg by mouth daily. Swallow whole.     atorvastatin  (LIPITOR) 40 MG tablet Take 40 mg by mouth at bedtime.     Cinnamon 500 MG TABS Take  1,000 mg by mouth every evening.     cyanocobalamin  (VITAMIN B12) 1000 MCG tablet Take 1,000 mcg by mouth daily.     donepezil  (ARICEPT ) 5 MG tablet Take 5 mg by mouth at bedtime.     ferrous gluconate  (FERGON) 324 MG tablet Take 324 mg by mouth daily with breakfast.     folic acid  (FOLVITE ) 800 MCG tablet Take 800 mcg by mouth daily.     levalbuterol (XOPENEX) 0.63 MG/3ML nebulizer solution Take 0.63 mg by nebulization every 6 (six) hours as needed for wheezing or shortness of breath.     levothyroxine  (SYNTHROID ) 125 MCG tablet Take 125 mcg by mouth See admin instructions. Take 125 mcg by mouth before breakfast on Mon/Tues/Wed/Thurs/Fri/Sat (take nothing on Sundays)     LINZESS  145 MCG CAPS capsule TAKE 1 CAPSULE BY MOUTH DAILY BEFORE BREAKFAST. 30 capsule 6   montelukast  (SINGULAIR ) 10 MG tablet Take 10 mg by mouth daily in the afternoon.     Multiple Vitamins-Minerals (HAIR SKIN NAILS PO) Take 2 capsules by mouth daily.     nitroGLYCERIN  (NITROSTAT ) 0.4 MG SL tablet Place 0.4 mg under the tongue every 5 (five) minutes as needed for chest pain.     NON FORMULARY Take 2 tablets by mouth daily. Sea Moss Advanced     Omega-3 Fatty Acids (FISH OIL BURP-LESS) 1200 MG CAPS Take 1,200 mg by mouth daily in the afternoon.     pantoprazole  (PROTONIX ) 40 MG tablet Take 1 tablet (40 mg total) by mouth daily before breakfast. 90 tablet 1   potassium chloride  SA (KLOR-CON  M) 20 MEQ tablet Take 20 mEq by mouth 3 (three) times a week.     revefenacin (YUPELRI) 175 MCG/3ML nebulizer solution Take 175 mcg by nebulization daily.     spironolactone (ALDACTONE) 25 MG tablet Take 25 mg by mouth daily.     WIXELA INHUB 250-50 MCG/ACT AEPB Inhale 1 puff into the lungs in the morning and at bedtime.     ascorbic acid (VITAMIN C) 500 MG tablet Take 500 mg by mouth daily. (Patient not taking: Reported on 02/09/2024)  No current facility-administered medications for this visit.    REVIEW OF SYSTEMS:    Constitutional: ( - ) fevers, ( - )  chills , ( - ) night sweats (+) fatigue Eyes: ( - ) blurriness of vision, ( - ) double vision, ( - ) watery eyes Ears, nose, mouth, throat, and face: ( - ) mucositis, ( - ) sore throat Respiratory: ( - ) cough, ( - ) dyspnea, ( - ) wheezes Cardiovascular: ( - ) palpitation, ( - ) chest discomfort, ( + ) lower extremity swelling Gastrointestinal:  ( - ) nausea, ( - ) heartburn, ( - ) change in bowel habits Skin: ( - ) abnormal skin rashes Lymphatics: ( - ) new lymphadenopathy, ( - ) easy bruising Neurological: ( - ) numbness, ( - ) tingling, ( - ) new weaknesses Behavioral/Psych: ( - ) mood change, ( - ) new changes  All other systems were reviewed with the patient and are negative.  PHYSICAL EXAMINATION: ECOG PERFORMANCE STATUS: 1 - Symptomatic but completely ambulatory  Vitals:   02/09/24 1007  BP: (!) 126/56  Pulse: 71  Resp: 16  Temp: 97.7 F (36.5 C)  SpO2: 100%   Filed Weights   02/09/24 1007  Weight: 195 lb (88.5 kg)    GENERAL: elderly appearing African American female in NAD  SKIN: skin color, texture, turgor are normal, no rashes or significant lesions EYES: conjunctiva are pink and non-injected, sclera clear LUNGS: clear to auscultation and percussion with normal breathing effort HEART: regular rate & rhythm and no murmurs. Bilateral lower extremity edema Musculoskeletal: no cyanosis of digits and no clubbing  PSYCH: alert & oriented x 3, fluent speech NEURO: no focal motor/sensory deficits  LABORATORY DATA:  I have reviewed the data as listed    Latest Ref Rng & Units 02/09/2024    9:42 AM 01/18/2024   11:57 AM 01/11/2024    7:56 AM  CBC  WBC 4.0 - 10.5 K/uL 5.2   5.8   Hemoglobin 12.0 - 15.0 g/dL 8.4  9.1  8.5   Hematocrit 36.0 - 46.0 % 27.7  27.9  27.2   Platelets 150 - 400 K/uL 273   244        Latest Ref Rng & Units 02/09/2024    9:42 AM 01/11/2024    7:56 AM 12/06/2023    2:00 PM  CMP  Glucose 70 - 99 mg/dL 898   899  83   BUN 8 - 23 mg/dL 14  14  14    Creatinine 0.44 - 1.00 mg/dL 8.72  8.66  8.38   Sodium 135 - 145 mmol/L 143  142  141   Potassium 3.5 - 5.1 mmol/L 4.2  4.2  3.9   Chloride 98 - 111 mmol/L 109  110  111   CO2 22 - 32 mmol/L 28  26  21    Calcium  8.9 - 10.3 mg/dL 8.9  8.7  8.7   Total Protein 6.5 - 8.1 g/dL 6.9  6.7  6.3   Total Bilirubin 0.0 - 1.2 mg/dL 0.8  0.8  0.9   Alkaline Phos 38 - 126 U/L 142  113  77   AST 15 - 41 U/L 12  14  17    ALT 0 - 44 U/L 7  7  8      RADIOGRAPHIC STUDIES: None relevant to review.  DG Chest 2 View Result Date: 01/15/2024 CLINICAL DATA:  Follow-up left lower lobe pneumonia. EXAM: CHEST - 2 VIEW COMPARISON:  12/06/2023 and older exams.  CT, 01/31/2021. FINDINGS: Cardiac silhouette is normal in size configuration. Prominent bilateral pulmonary arteries, stable. No mediastinal or hilar masses. No evidence of adenopathy. Lungs are hyperexpanded, but clear. Previously noted left lower lobe airspace disease has resolved. No pleural effusion or pneumothorax. Skeletal structures are intact. IMPRESSION: 1. No active cardiopulmonary disease. Resolved left lower lobe pneumonia. Electronically Signed   By: Alm Parkins M.D.   On: 01/15/2024 12:13     ASSESSMENT & PLAN Lacey Vega is a 73 y.o. female with medical history significant for iron  deficiency anemia 2/2 to chronic blood loss presents for a follow up visit.    #Iron  Deficiency Anemia from Chronic Blood Loss --source of blood loss previously identified as AVMs in the GI tract. Likely a strong contribution from the patient's sickle cell trait preventing her from reaching a normal Hgb. Baseline Hgb is approximately 10.  --Patient last received IV feraheme  510 mg x 2 doses from 07/07/23-07/16/23.  --transfusion goal for Hgb <7.0.  PLAN: --labs today show WBC 5.2, Hgb 8.4, MCV 80.3, Plt 273. Iron  panel shows deficiency with saturation 10%, ferritin 31.  --Continue PO ferrous sulfide 325mg  daily with  source of vitamin C. --Recommend IV iron  to bolster levels.  --RTC in 12 weeks with interval 4 week lab checks. Strict return precautions for any signs/symptoms of worsening anemia.    #Sickle Cell Trait -- Hgb electrophoresis confirmed sickle cell trait on 07/27/2019. --like a strong contributing factor to the patients persistently low Hgb. Most likely her baseline hemoglobin is approximately 10.0   #Bleeding AVM/GI Bleeding --GI following  --recently hospitalized from 12/04/2023-12/07/2023  due to GI bleeding and acute blood loss anemia. EGD on 12/05/2023 revealed 3 mm bleeding AVM in the duodenum and 2 nonbleeding AVMs in the jejunum. Received 2 units of PRBC on 12/04/2023.   Orders Placed This Encounter  Procedures   CBC with Differential (Cancer Center Only)    Standing Status:   Standing    Number of Occurrences:   3    Expiration Date:   02/08/2025   Ferritin    Standing Status:   Standing    Number of Occurrences:   3    Expiration Date:   02/08/2025   Iron  and Iron  Binding Capacity ([facility] only)    Standing Status:   Standing    Number of Occurrences:   3    Expiration Date:   02/08/2025   CMP (Cancer Center only)    Standing Status:   Standing    Number of Occurrences:   3    Expiration Date:   02/08/2025   All questions were answered. The patient knows to call the clinic with any problems, questions or concerns.   I have spent a total of 25 minutes minutes of face-to-face and non-face-to-face time, preparing to see the patient, obtaining and/or reviewing separately obtained history, performing a medically appropriate examination, counseling and educating the patient, ordering medications/tests/procedures, documenting clinical information in the electronic health record, independently interpreting results and communicating results to the patient, and care coordination.   Johnston Police PA-C Dept of Hematology and Oncology [facility] Phone:  [phone]   02/09/2024 2:19 PM

## 2024-02-10 ENCOUNTER — Encounter: Payer: Self-pay | Admitting: Physical Therapy

## 2024-02-10 ENCOUNTER — Ambulatory Visit: Admitting: Physical Therapy

## 2024-02-10 ENCOUNTER — Telehealth: Payer: Self-pay | Admitting: Pharmacy Technician

## 2024-02-10 DIAGNOSIS — M6281 Muscle weakness (generalized): Secondary | ICD-10-CM

## 2024-02-10 DIAGNOSIS — R29898 Other symptoms and signs involving the musculoskeletal system: Secondary | ICD-10-CM

## 2024-02-10 DIAGNOSIS — M5459 Other low back pain: Secondary | ICD-10-CM

## 2024-02-10 NOTE — Telephone Encounter (Signed)
 Auth Submission: NO AUTH NEEDED Site of care: Site of care: [facility] Payer: [employer] MEDICARE Medication & CPT/J Code(s) submitted: Venofer  (Iron  Sucrose) J1756 Diagnosis Code:  Route of submission (phone, fax, portal):  Phone # Fax # Auth type: Buy/Bill PB Units/visits requested: X5 DOSES Reference number:  Approval from: 02/10/24 to 06/01/24

## 2024-02-10 NOTE — Therapy (Signed)
 OUTPATIENT PHYSICAL THERAPY THORACOLUMBAR TREATMENT    Patient Name: Lacey Vega MRN: [id number] DOB:1950/11/12, 73 y.o., female Today's Date: 02/10/2024  END OF SESSION:  PT End of Session - 02/10/24 1149     Visit Number 6    Date for PT Re-Evaluation 03/22/24    PT Start Time 1149    PT Stop Time 1230    PT Time Calculation (min) 41 min    Activity Tolerance Patient tolerated treatment well    Behavior During Therapy WFL for tasks assessed/performed            Past Medical History:  Diagnosis Date   Anemia    Anxiety    Asthma    Asthma    Bronchitis    hx   Colon polyps    hyperplastic   Diabetes (HCC)    mild, history of   DJD (degenerative joint disease), cervical    Dysphagia    history of   Dyspnea    Gallstones    GERD (gastroesophageal reflux disease)    H/O chest pain    secondary to anemia   Headache    History of arteriovenous malformation (AVM)    History of blood transfusion 10/2021   History of hiatal hernia    History of tobacco use    Hyperlipemia    Hypertension    Hypothyroidism    Iron  deficiency anemia    Sickle cell anemia (HCC)    patient is not aware of this   Thyroid  disease    TIA (transient ischemic attack)    was told that she possible had one   Wears dentures    Wears glasses    Past Surgical History:  Procedure Laterality Date   ABDOMINAL AORTIC ENDOVASCULAR STENT GRAFT Bilateral 01/31/2021   Procedure: ENDOVASCULAR ANEURYSM REPAIR (EVAR)Bilateral Groin Cutdown, left femoral endaterectomy with bovine patch angioplasty.;  Surgeon: Magda Debby SAILOR, MD;  Location: [facility] OR;  Service: Vascular;  Laterality: Bilateral;   ABDOMINAL HYSTERECTOMY     ANTERIOR CERVICAL DECOMP/DISCECTOMY FUSION N/A 10/03/2016   Procedure: ANTERIOR CERVICAL DECOMPRESSION/DISCECTOMY FUSION CERVICAL THREE- CERVICAL FOUR;  Surgeon: Gillie Duncans, MD;  Location: [facility] OR;  Service: Neurosurgery;  Laterality: N/A;  Right side approach   BOTOX   INJECTION  08/29/2015   Procedure: BOTOX  INJECTION;  Surgeon: Jerrell Sol, MD;  Location: [facility] ENDOSCOPY;  Service: Endoscopy;;   CHOLECYSTECTOMY     COLONOSCOPY  12/03/2011   Procedure: COLONOSCOPY;  Surgeon: Jerrell KYM Sol, MD;  Location: [facility] ENDOSCOPY;  Service: Endoscopy;  Laterality: N/A;   COLONOSCOPY N/A 11/27/2021   Procedure: COLONOSCOPY;  Surgeon: Federico Rosario BROCKS, MD;  Location: [facility];  Service: Gastroenterology;  Laterality: N/A;   ENTEROSCOPY N/A 07/07/2021   Procedure: ENTEROSCOPY;  Surgeon: Rosalie Kitchens, MD;  Location: [facility] ENDOSCOPY;  Service: Endoscopy;  Laterality: N/A;   ENTEROSCOPY N/A 08/06/2021   Procedure: ENTEROSCOPY;  Surgeon: Saintclair Jasper, MD;  Location: [facility] ENDOSCOPY;  Service: Gastroenterology;  Laterality: N/A;   ENTEROSCOPY N/A 11/27/2021   Procedure: SMALL  BOWEL ENTEROSCOPY;  Surgeon: Federico Rosario BROCKS, MD;  Location: [facility]OS[facility];  Service: Gastroenterology;  Laterality: N/A;   ENTEROSCOPY N/A 11/07/2022   Procedure: ENTEROSCOPY;  Surgeon: Charlanne Groom, MD;  Location: [facility];  Service: Gastroenterology;  Laterality: N/A;   ENTEROSCOPY N/A 04/03/2023   Procedure: ENTEROSCOPY;  Surgeon: Avram Lupita BRAVO, MD;  Location: [facility];  Service: Gastroenterology;  Laterality: N/A;   ENTEROSCOPY N/A 12/05/2023   Procedure: ENTEROSCOPY;  Surgeon: Federico Rosario BROCKS,  MD;  Location: [facility]OS[facility];  Service: Gastroenterology;  Laterality: N/A;   ESOPHAGEAL MANOMETRY N/A 07/13/2017   Procedure: ESOPHAGEAL MANOMETRY (EM);  Surgeon: Dianna Specking, MD;  Location: [facility];  Service: Endoscopy;  Laterality: N/A;   ESOPHAGOGASTRODUODENOSCOPY  12/03/2011   Procedure: ESOPHAGOGASTRODUODENOSCOPY (EGD);  Surgeon: Specking KYM Dianna, MD;  Location: [facility];  Service: Endoscopy;  Laterality: N/A;   ESOPHAGOGASTRODUODENOSCOPY (EGD) WITH PROPOFOL  N/A 08/29/2015   Procedure: ESOPHAGOGASTRODUODENOSCOPY (EGD) WITH PROPOFOL ;  Surgeon: Specking Dianna, MD;  Location: [facility] ENDOSCOPY;   Service: Endoscopy;  Laterality: N/A;   ESOPHAGOGASTRODUODENOSCOPY (EGD) WITH PROPOFOL  N/A 05/17/2019   Procedure: ESOPHAGOGASTRODUODENOSCOPY (EGD) WITH PROPOFOL ;  Surgeon: Dianna Specking, MD;  Location: [facility];  Service: Endoscopy;  Laterality: N/A;   ESOPHAGOGASTRODUODENOSCOPY (EGD) WITH PROPOFOL  N/A 04/24/2021   Procedure: ESOPHAGOGASTRODUODENOSCOPY (EGD) WITH PROPOFOL ;  Surgeon: Elicia Claw, MD;  Location: [facility] ENDOSCOPY;  Service: Gastroenterology;  Laterality: N/A;   GIVENS CAPSULE STUDY N/A 08/05/2018   Procedure: GIVENS CAPSULE STUDY;  Surgeon: Dianna Specking, MD;  Location: [facility] ENDOSCOPY;  Service: Endoscopy;  Laterality: N/A;   GIVENS CAPSULE STUDY N/A 07/06/2021   Procedure: GIVENS CAPSULE STUDY;  Surgeon: Rosalie Kitchens, MD;  Location: [facility]OS[facility];  Service: Endoscopy;  Laterality: N/A;   HOT HEMOSTASIS  12/03/2011   Procedure: HOT HEMOSTASIS (ARGON PLASMA COAGULATION/BICAP);  Surgeon: Specking KYM Dianna, MD;  Location: [facility];  Service: Endoscopy;  Laterality: N/A;   HOT HEMOSTASIS N/A 07/07/2021   Procedure: HOT HEMOSTASIS (ARGON PLASMA COAGULATION/BICAP);  Surgeon: Rosalie Kitchens, MD;  Location: [facility];  Service: Endoscopy;  Laterality: N/A;   HOT HEMOSTASIS N/A 08/06/2021   Procedure: HOT HEMOSTASIS (ARGON PLASMA COAGULATION/BICAP);  Surgeon: Saintclair Jasper, MD;  Location: [facility];  Service: Gastroenterology;  Laterality: N/A;   HOT HEMOSTASIS  11/27/2021   Procedure: HOT HEMOSTASIS (ARGON PLASMA COAGULATION/BICAP);  Surgeon: Federico Rosario BROCKS, MD;  Location: [facility];  Service: Gastroenterology;;  EGD and COLON   HOT HEMOSTASIS N/A 11/07/2022   Procedure: HOT HEMOSTASIS (ARGON PLASMA COAGULATION/BICAP);  Surgeon: Charlanne Groom, MD;  Location: [facility];  Service: Gastroenterology;  Laterality: N/A;   HOT HEMOSTASIS N/A 04/03/2023   Procedure: HOT HEMOSTASIS (ARGON PLASMA COAGULATION/BICAP);  Surgeon: Avram Lupita BRAVO, MD;  Location: [facility];  Service:  Gastroenterology;  Laterality: N/A;   HOT HEMOSTASIS N/A 12/05/2023   Procedure: EGD, WITH ARGON PLASMA COAGULATION;  Surgeon: Federico Rosario BROCKS, MD;  Location: [facility] ENDOSCOPY;  Service: Gastroenterology;  Laterality: N/A;   NECK SURGERY     PERIPHERAL VASCULAR INTERVENTION  03/29/2021   Procedure: PERIPHERAL VASCULAR INTERVENTION;  Surgeon: Magda Debby SAILOR, MD;  Location: [facility] INVASIVE CV LAB;  Service: Cardiovascular;;  Lt SFA Brachial Approach   POLYPECTOMY  11/27/2021   Procedure: POLYPECTOMY;  Surgeon: Federico Rosario BROCKS, MD;  Location: [facility];  Service: Gastroenterology;;   ROTATOR CUFF REPAIR     right   ROTATOR CUFF REPAIR Right 2014   THROMBECTOMY FEMORAL ARTERY Left 01/31/2021   Procedure: THROMBECTOMY POPLITEAL  ARTERY;  Surgeon: Magda Debby SAILOR, MD;  Location: [facility] OR;  Service: Vascular;  Laterality: Left;   TONSILLECTOMY     Patient Active Problem List   Diagnosis Date Noted   Upper GI bleed 12/04/2023   History of angiodysplasia of intestinal tract 12/04/2023   Chronic kidney disease, stage 3b (HCC) 12/04/2023   Chest pain 12/04/2023   Hypocalcemia 12/04/2023   Thrombocytopenia (HCC) 12/04/2023   Cognitive impairment 12/04/2023   GERD (gastroesophageal reflux disease) 12/04/2023   Obesity (BMI 30-39.9) 12/04/2023  Melena 04/02/2023   Symptomatic anemia 04/02/2023   Angiodysplasia of stomach and duodenum 04/02/2023   GI bleed 04/01/2023   Chronic kidney disease, stage 3a (HCC) 07/05/2021   Iron  deficiency anemia due to chronic blood loss 05/09/2021   Acute on chronic blood loss anemia 04/22/2021   AKI (acute kidney injury) (HCC) 04/22/2021   Epistaxis 04/22/2021   PAD (peripheral artery disease) (HCC) 04/22/2021   HLD (hyperlipidemia) 04/22/2021   Sickle cell trait (HCC) 04/22/2021   AAA (abdominal aortic aneurysm) 02/01/2021   Aortic aneurysm (HCC) 01/31/2021   Achalasia 10/11/2017   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   DJD (degenerative joint  disease), cervical    Iron  deficiency anemia    History of arteriovenous malformation (AVM)    History of tobacco use    Asthma    H/O chest pain    Diabetes (HCC)    Dysphagia    Iron  deficiency anemia, unspecified 12/03/2011   Hypothyroidism (acquired) 10/07/2010    Class: Chronic   DYSPHAGIA UNSPECIFIED 10/05/2009   ANEMIA 08/20/2009   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   DM 04/17/2009   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/05/1990    Class: Chronic    PCP: Addie Locus MD   REFERRING PROVIDER: Gillie Duncans, MD  REFERRING DIAG: Diagnosis M54.40 (ICD-10-CM) - Lumbago with sciatica, unspecified side  Rationale for Evaluation and Treatment: Rehabilitation  THERAPY DIAG:  Other low back pain  Other symptoms and signs involving the musculoskeletal system  Muscle weakness (generalized)  ONSET DATE: MD order 12/23/23  SUBJECTIVE:                                                                                                                                                                                           SUBJECTIVE STATEMENT:  Feel all right, but always feel tired  EVAL: Not sure what's happening with my back, it just hurts. When my blood gets low in the past my back hurt along with hit, when I got blood my back felt better. This time the pain stayed, its the lower part of my back. I'm OK as long as I'm sitting down but when I get up and move around it bothers me. Leaning forward takes pressure off my back.   PERTINENT HISTORY:  See above   PAIN:  Are you having pain? No I'm fine no number given on NPRS   PRECAUTIONS: None  RED FLAGS: None   WEIGHT BEARING RESTRICTIONS: No  FALLS:  Has patient fallen in last 6 months? 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  DIAGNOSTIC FINDINGS:   Narrative & Impression  MR LUMBAR SPINE WITHOUT IV CONTRAST   COMPARISON: None available   CLINICAL HISTORY: None available   TECHNIQUE: SAG T2, SAG T1, SAG STIR, AX T2, AX T1 without IV contrast.   FINDINGS: There is normal alignment of the lumbar spine. Mild multilevel disc desiccation and mild-to-moderate facet arthrosis. There is no vertebral body height loss, subluxation or marrow replacing process. The sacrum and SI joints are unremarkable so far as visualized. Conus and cauda equina are unremarkable.   T12-L1: There is no focal disc protrusion, foraminal or spinal stenosis.   L1-2: There is no focal disc protrusion, foraminal or spinal stenosis. Mild facet arthrosis.   L2-3: Mild broad-based bulge slightly effacing the ventral thecal sac. Moderate facet arthrosis bilaterally. There is caudal foraminal narrowing, left slightly greater than right. No impingement of the descending nerve roots or exiting nerves.   L3-4: Mild disc desiccation moderate facet arthrosis. No significant foraminal or spinal stenosis.   L4-5: Broad-based disc osteophyte moderate facet arthrosis. No significant foraminal or spinal stenosis.   L5-S1: There is a transitional vertebral level on the left with fusion of the L5 vertebral body in the S1 vertebral body best seen on sagittal image 14 and 15. No significant foraminal or spinal stenosis. Mild facet arthrosis is   There is a large infrarenal abdominal aortic aneurysm measuring 5.4 cm in AP dimension, 5.5 cm in transverse dimension and 10 cm in length. There is an endoluminal stent graft in place. Size of  the native aneurysm is relatively stable when compared with the prior CTA.   IMPRESSION: Mild degenerative disc desiccation facet arthrosis. No significant foraminal or spinal stenosis. Mild multilevel facet arthrosis most notably on the right at L3-4.    PATIENT SURVEYS:  PSFS: THE PATIENT SPECIFIC FUNCTIONAL SCALE  Place score of 0-10 (0 = unable to perform activity and 10 = able to perform activity at the same level as before injury or problem)  Activity Date: 01/12/24    Standing up for long periods  0    2. Walking for long periods  0    3.     4.      Total Score 0      Total Score = Sum of activity scores/number of activities  Minimally Detectable Change: 3 points (for single activity); 2 points (for average score)  [facility] (nd). The Patient Specific Functional Scale . Retrieved from [email or web address]   COGNITION: Overall cognitive status: Within functional limits for tasks assessed     SENSATION: [facility] Not tested  MUSCLE LENGTH:  Unable to assess, pt resisted passive movements for mm flexibility checks   POSTURE: rounded shoulders, forward head, increased lumbar lordosis, and decreased thoracic kyphosis   LUMBAR ROM:   AROM eval 8/292/5  Flexion 50% limited  50% limited  Extension 75% limited  50% limited  Right lateral flexion 75% limited  50% limited  Left lateral flexion 75% limited  50% limited  Right rotation 50% limited    Left rotation 50% limited     (  Blank rows = not tested)    LOWER EXTREMITY MMT:    MMT Right eval Left eval RT/Left 01/29/24  Hip flexion 3- 3- 3/3+  Hip extension     Hip abduction     Hip adduction     Hip internal rotation     Hip external rotation     Knee flexion 4 4   Knee extension 4 4   Ankle dorsiflexion     Ankle plantarflexion     Ankle inversion     Ankle eversion      (Blank rows = not tested)    TREATMENT DATE:  02/10/24 Gait 3  laps ~ 320 feet Without AD  Sit to stand x10  Chest press w/ red ball  Rows & Ext blue 2x10  Some back pain Pball roll outs lumbar stretch HS curls 20lb 2x10 Leg Ext unable to complete  LAQ AROM 2x10  02/08/24: Instructed in seated lumbar flexion, rotation, B hands on cane , trial after she awakens to improve lumbar motion prior to standing, walking activities Inst in standing partial lunge with ant hip jt stretch, alt sides 10 to 15 sec hold Nustep L5 x 7 min Ue's and LE's Seated red t band rows 15 x Seated alt shoulder ext with red t band 15 x  Standing alt hamstring curls with replication of pain lower back so dc'd Marching with B hands supported on ll bars with 1 1/2# cuff wts 15x each Seated hip abd with blue t band around knees 15x  01/29/24 Nustep L 6 Checked Goals and ROM and MMT Blue tband trunk ext 2 sets 10 Isometric abdominal squeeze 2 sets 10 with ball Standing red tband row and shld ext 2 sets 10 STS yellow ball chest press 10 x Red tband standing SL hip flex,ext and abd 10 x each ADDED to HEP -see below  01/21/24  Nustep L2 x8 minutes seat 9, all four extremities Goal check/added LTG for balance    Seated TA set 15x3 seconds  Seated TA set + march x15 B Hip hike x10 B LAQs green TB x10 B (alternating)  HS green TB x10 B (alternating) Seated hip ABD green TB x15  One step on 4 inch step/other on floor 3x30 seconds B  Narrow BOS on blue foam circle 2x30 seconds     PATIENT EDUCATION:  Education details: exam findings, POC, HEP  Person educated: Patient Education method: Explanation, Demonstration, and Handouts Education comprehension: verbalized understanding, returned demonstration, and needs further education  HOME EXERCISE PROGRAM:  Access Code: [email or web address] URL: [email or web address] Date: 01/29/2024 Prepared by: Angela Payseur  Exercises - Standing Hip Flexion with Resistance Loop  - 1 x daily - 7 x weekly - 1 sets - 10  reps - Hip Abduction with Resistance Loop  - 1 x daily - 7 x weekly - 1 sets - 10 reps - Hip Extension with Resistance Loop  - 1 x daily - 7 x weekly - 1 sets - 10 reps - Sit to Stand  - 1 x daily - 7 x weekly - 1 sets - 10 reps - Seated Hip Flexion  - 1 x daily - 7 x weekly - 2 sets - 10 reps - Seated Long Arc Quad  - 1 x daily - 7 x weekly - 2 sets - 10 reps - Seated Hip Abduction with Resistance  - 1 x daily - 7 x weekly - 2 sets - 10 reps  Access Code: [email or web address] URL:  [email or web address] Date: 01/21/2024 Prepared by: Josette Rough  Exercises - Seated Flexion Stretch  - 2 x daily - 7 x weekly - 1 sets - 10 reps - 3 seconds  hold - Seated Sidebending  - 2 x daily - 7 x weekly - 1 sets - 10 reps - 3 seconds  hold - Seated Trunk Rotation - Arms Crossed  - 2 x daily - 7 x weekly - 1 sets - 10 reps - 3 seconds  hold - Standing Transverse Abdominis Contraction  - 2 x daily - 7 x weekly - 1 sets - 15 reps - 3 seconds  hold - Seated Transversus Abdominus Bracing + March  - 2 x daily - 7 x weekly - 1 sets - 10 reps - Standing Hip Hiking  - 2 x daily - 7 x weekly - 1 sets - 10 reps  ASSESSMENT:  CLINICAL IMPRESSION: Pt enters doing well with no pain. She reports back pain can com eat any time with a 10/10 rating. Functional interventions to ain in increasing her functional capacity. Increase pain low back pain during the end of gait trial. LBP also reported with shoulder Ext.  She tolerated most exercises well, would benefit from continued PT. Encourage pt to be ore active at home.   OBJECTIVE IMPAIRMENTS: Abnormal gait, decreased activity tolerance, decreased mobility, difficulty walking, decreased ROM, decreased strength, hypomobility, increased fascial restrictions, increased muscle spasms, impaired flexibility, obesity, and pain.   ACTIVITY LIMITATIONS: carrying, lifting, standing, squatting, stairs, transfers, hygiene/grooming, and locomotion level  PARTICIPATION  LIMITATIONS: meal prep, cleaning, laundry, driving, shopping, community activity, and yard work  PERSONAL FACTORS: Age, Behavior pattern, Education, Fitness, and Past/current experiences are also affecting patient's functional outcome.   REHAB POTENTIAL: Fair chronicity of pain, low health literacy   CLINICAL DECISION MAKING: Stable/uncomplicated  EVALUATION COMPLEXITY: Low   GOALS: Goals reviewed with patient? No  SHORT TERM GOALS: Target date: 02/16/2024    Will be compliant with appropriate progressive HEP  Baseline:// Goal status: 01/29/24 MET  2.  Lumbar AROM to be no more than 25% limited all planes  Baseline:  Goal status: progressing 01/29/24  3.  Back pain to be no more than 7/10 at worst  Baseline:  Goal status: MET 01/26/24  4.  Will demonstrate improved awareness of functional posture  Baseline:  Goal status: ONGOING 01/21/24  and 01/29/24    LONG TERM GOALS: Target date: 03/22/2024    MMT to have improved by at least 1 grade all weak groups  Baseline:  Goal status: INITIAL  2.  Will be able to stand and cook/perform house work as desired for at least 45 minutes before back pain increases  Baseline:  Goal status: INITIAL  3.  Will be able to complete a grocery shopping trip of 30-45 minutes in duration before back pain increases  Baseline:  Goal status: ongoing 02/10/24  4.  Back pain to be no more than 5/10 at worst  Baseline:  Goal status: ongoing 02/10/24  5.  PSFS to have improved by at least 2 points  Baseline:  Goal status: INITIAL  6. Will score at least 48 on Berg to show improved functional balance  Goal status: NEW 01/21/24    PLAN:  PT FREQUENCY: 2x/week  PT DURATION: 10 weeks  PLANNED INTERVENTIONS: 97750- Physical Performance Testing, 97110-Therapeutic exercises, 97530- Therapeutic activity, 97112- Neuromuscular re-education, 97535- Self Care, [id number]- Manual therapy, [id number]- Gait training, and [id number]- Aquatic Therapy.  PLAN FOR NEXT  SESSION: Can't tolerate  laying supine.  Continue to progress strength and balance.   Greig Credit, PT, DPT, OCS 02/10/24 11:49 AM

## 2024-02-15 ENCOUNTER — Ambulatory Visit: Admitting: Physical Therapy

## 2024-02-15 ENCOUNTER — Encounter: Payer: Self-pay | Admitting: Physical Therapy

## 2024-02-15 DIAGNOSIS — M5459 Other low back pain: Secondary | ICD-10-CM

## 2024-02-15 DIAGNOSIS — M6281 Muscle weakness (generalized): Secondary | ICD-10-CM | POA: Diagnosis not present

## 2024-02-15 DIAGNOSIS — R29898 Other symptoms and signs involving the musculoskeletal system: Secondary | ICD-10-CM

## 2024-02-15 NOTE — Therapy (Signed)
 OUTPATIENT PHYSICAL THERAPY THORACOLUMBAR TREATMENT    Patient Name: Lacey Vega MRN: [id number] DOB:[birth year], 73 y.o., female Today's Date: 02/15/2024  END OF SESSION:  PT End of Session - 02/15/24 0930     Visit Number 7    Date for PT Re-Evaluation 03/22/24    PT Start Time 0930    PT Stop Time 1015    PT Time Calculation (min) 45 min    Activity Tolerance Patient tolerated treatment well    Behavior During Therapy WFL for tasks assessed/performed            Past Medical History:  Diagnosis Date   Anemia    Anxiety    Asthma    Asthma    Bronchitis    hx   Colon polyps    hyperplastic   Diabetes (HCC)    mild, history of   DJD (degenerative joint disease), cervical    Dysphagia    history of   Dyspnea    Gallstones    GERD (gastroesophageal reflux disease)    H/O chest pain    secondary to anemia   Headache    History of arteriovenous malformation (AVM)    History of blood transfusion 10/2021   History of hiatal hernia    History of tobacco use    Hyperlipemia    Hypertension    Hypothyroidism    Iron  deficiency anemia    Sickle cell anemia (HCC)    patient is not aware of this   Thyroid  disease    TIA (transient ischemic attack)    was told that she possible had one   Wears dentures    Wears glasses    Past Surgical History:  Procedure Laterality Date   ABDOMINAL AORTIC ENDOVASCULAR STENT GRAFT Bilateral 01/31/2021   Procedure: ENDOVASCULAR ANEURYSM REPAIR (EVAR)Bilateral Groin Cutdown, left femoral endaterectomy with bovine patch angioplasty.;  Surgeon: Magda Debby SAILOR, MD;  Location: [facility] OR;  Service: Vascular;  Laterality: Bilateral;   ABDOMINAL HYSTERECTOMY     ANTERIOR CERVICAL DECOMP/DISCECTOMY FUSION N/A 10/03/2016   Procedure: ANTERIOR CERVICAL DECOMPRESSION/DISCECTOMY FUSION CERVICAL THREE- CERVICAL FOUR;  Surgeon: Gillie Duncans, MD;  Location: [facility] OR;  Service: Neurosurgery;  Laterality: N/A;  Right side approach   BOTOX   INJECTION  08/29/2015   Procedure: BOTOX  INJECTION;  Surgeon: Jerrell Sol, MD;  Location: [facility] ENDOSCOPY;  Service: Endoscopy;;   CHOLECYSTECTOMY     COLONOSCOPY  12/03/2011   Procedure: COLONOSCOPY;  Surgeon: Jerrell KYM Sol, MD;  Location: [facility] ENDOSCOPY;  Service: Endoscopy;  Laterality: N/A;   COLONOSCOPY N/A 11/27/2021   Procedure: COLONOSCOPY;  Surgeon: Federico Rosario BROCKS, MD;  Location: [facility];  Service: Gastroenterology;  Laterality: N/A;   ENTEROSCOPY N/A 07/07/2021   Procedure: ENTEROSCOPY;  Surgeon: Rosalie Kitchens, MD;  Location: [facility] ENDOSCOPY;  Service: Endoscopy;  Laterality: N/A;   ENTEROSCOPY N/A 08/06/2021   Procedure: ENTEROSCOPY;  Surgeon: Saintclair Jasper, MD;  Location: [facility] ENDOSCOPY;  Service: Gastroenterology;  Laterality: N/A;   ENTEROSCOPY N/A 11/27/2021   Procedure: SMALL  BOWEL ENTEROSCOPY;  Surgeon: Federico Rosario BROCKS, MD;  Location: [facility] ENDOSCOPY;  Service: Gastroenterology;  Laterality: N/A;   ENTEROSCOPY N/A 11/07/2022   Procedure: ENTEROSCOPY;  Surgeon: Charlanne Groom, MD;  Location: [facility] ENDOSCOPY;  Service: Gastroenterology;  Laterality: N/A;   ENTEROSCOPY N/A 04/03/2023   Procedure: ENTEROSCOPY;  Surgeon: Avram Lupita BRAVO, MD;  Location: [facility]OS[facility];  Service: Gastroenterology;  Laterality: N/A;   ENTEROSCOPY N/A 12/05/2023   Procedure: ENTEROSCOPY;  Surgeon: Federico Rosario BROCKS,  MD;  Location: [facility] ENDOSCOPY;  Service: Gastroenterology;  Laterality: N/A;   ESOPHAGEAL MANOMETRY N/A 07/13/2017   Procedure: ESOPHAGEAL MANOMETRY (EM);  Surgeon: Dianna Specking, MD;  Location: [facility] ENDOSCOPY;  Service: Endoscopy;  Laterality: N/A;   ESOPHAGOGASTRODUODENOSCOPY  12/03/2011   Procedure: ESOPHAGOGASTRODUODENOSCOPY (EGD);  Surgeon: Specking KYM Dianna, MD;  Location: [facility];  Service: Endoscopy;  Laterality: N/A;   ESOPHAGOGASTRODUODENOSCOPY (EGD) WITH PROPOFOL  N/A 08/29/2015   Procedure: ESOPHAGOGASTRODUODENOSCOPY (EGD) WITH PROPOFOL ;  Surgeon: Specking Dianna, MD;  Location[facility] ENDOSCOPY;   Service: Endoscopy;  Laterality: N/A;   ESOPHAGOGASTRODUODENOSCOPY (EGD) WITH PROPOFOL  N/A 05/17/2019   Procedure: ESOPHAGOGASTRODUODENOSCOPY (EGD) WITH PROPOFOL ;  Surgeon: Dianna Specking, MD;  Location: [facility] ENDOSCOPY;  Service: Endoscopy;  Laterality: N/A;   ESOPHAGOGASTRODUODENOSCOPY (EGD) WITH PROPOFOL  N/A 04/24/2021   Procedure: ESOPHAGOGASTRODUODENOSCOPY (EGD) WITH PROPOFOL ;  Surgeon: Elicia Claw, MD;  Location: [facility] ENDOSCOPY;  Service: Gastroenterology;  Laterality: N/A;   GIVENS CAPSULE STUDY N/A 08/05/2018   Procedure: GIVENS CAPSULE STUDY;  Surgeon: Dianna Specking, MD;  Location: [facility] ENDOSCOPY;  Service: Endoscopy;  Laterality: N/A;   GIVENS CAPSULE STUDY N/A 07/06/2021   Procedure: GIVENS CAPSULE STUDY;  Surgeon: Rosalie Kitchens, MD;  Location: [facility] ENDOSCOPY;  Service: Endoscopy;  Laterality: N/A;   HOT HEMOSTASIS  12/03/2011   Procedure: HOT HEMOSTASIS (ARGON PLASMA COAGULATION/BICAP);  Surgeon: Specking KYM Dianna, MD;  Location: [facility];  Service: Endoscopy;  Laterality: N/A;   HOT HEMOSTASIS N/A 07/07/2021   Procedure: HOT HEMOSTASIS (ARGON PLASMA COAGULATION/BICAP);  Surgeon: Rosalie Kitchens, MD;  Location: [facility];  Service: Endoscopy;  Laterality: N/A;   HOT HEMOSTASIS N/A 08/06/2021   Procedure: HOT HEMOSTASIS (ARGON PLASMA COAGULATION/BICAP);  Surgeon: Saintclair Jasper, MD;  Location: [facility];  Service: Gastroenterology;  Laterality: N/A;   HOT HEMOSTASIS  11/27/2021   Procedure: HOT HEMOSTASIS (ARGON PLASMA COAGULATION/BICAP);  Surgeon: Federico Rosario BROCKS, MD;  Location: [facility];  Service: Gastroenterology;;  EGD and COLON   HOT HEMOSTASIS N/A 11/07/2022   Procedure: HOT HEMOSTASIS (ARGON PLASMA COAGULATION/BICAP);  Surgeon: Charlanne Groom, MD;  Location: [facility];  Service: Gastroenterology;  Laterality: N/A;   HOT HEMOSTASIS N/A 04/03/2023   Procedure: HOT HEMOSTASIS (ARGON PLASMA COAGULATION/BICAP);  Surgeon: Avram Lupita BRAVO, MD;  Location: [facility];  Service:  Gastroenterology;  Laterality: N/A;   HOT HEMOSTASIS N/A 12/05/2023   Procedure: EGD, WITH ARGON PLASMA COAGULATION;  Surgeon: Federico Rosario BROCKS, MD;  Location: [facility] ENDOSCOPY;  Service: Gastroenterology;  Laterality: N/A;   NECK SURGERY     PERIPHERAL VASCULAR INTERVENTION  03/29/2021   Procedure: PERIPHERAL VASCULAR INTERVENTION;  Surgeon: Magda Debby SAILOR, MD;  Location: [facility] INVASIVE CV LAB;  Service: Cardiovascular;;  Lt SFA Brachial Approach   POLYPECTOMY  11/27/2021   Procedure: POLYPECTOMY;  Surgeon: Federico Rosario BROCKS, MD;  Location: [facility];  Service: Gastroenterology;;   ROTATOR CUFF REPAIR     right   ROTATOR CUFF REPAIR Right 2014   THROMBECTOMY FEMORAL ARTERY Left 01/31/2021   Procedure: THROMBECTOMY POPLITEAL  ARTERY;  Surgeon: Magda Debby SAILOR, MD;  Location: [facility] OR;  Service: Vascular;  Laterality: Left;   TONSILLECTOMY     Patient Active Problem List   Diagnosis Date Noted   Upper GI bleed 12/04/2023   History of angiodysplasia of intestinal tract 12/04/2023   Chronic kidney disease, stage 3b (HCC) 12/04/2023   Chest pain 12/04/2023   Hypocalcemia 12/04/2023   Thrombocytopenia (HCC) 12/04/2023   Cognitive impairment 12/04/2023   GERD (gastroesophageal reflux disease) 12/04/2023   Obesity (BMI 30-39.9) 12/04/2023  Melena 04/02/2023   Symptomatic anemia 04/02/2023   Angiodysplasia of stomach and duodenum 04/02/2023   GI bleed 04/01/2023   Chronic kidney disease, stage 3a (HCC) 07/05/2021   Iron  deficiency anemia due to chronic blood loss 05/09/2021   Acute on chronic blood loss anemia 04/22/2021   AKI (acute kidney injury) (HCC) 04/22/2021   Epistaxis 04/22/2021   PAD (peripheral artery disease) (HCC) 04/22/2021   HLD (hyperlipidemia) 04/22/2021   Sickle cell trait (HCC) 04/22/2021   AAA (abdominal aortic aneurysm) 02/01/2021   Aortic aneurysm (HCC) 01/31/2021   Achalasia 10/11/2017   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   DJD (degenerative joint  disease), cervical    Iron  deficiency anemia    History of arteriovenous malformation (AVM)    History of tobacco use    Asthma    H/O chest pain    Diabetes (HCC)    Dysphagia    Iron  deficiency anemia, unspecified 12/03/2011   Hypothyroidism (acquired) 10/07/2010    Class: Chronic   DYSPHAGIA UNSPECIFIED 10/05/2009   ANEMIA 08/20/2009   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   DM 04/17/2009   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/05/1990    Class: Chronic    PCP: Addie Locus MD   REFERRING PROVIDER: Gillie Duncans, MD  REFERRING DIAG: Diagnosis M54.40 (ICD-10-CM) - Lumbago with sciatica, unspecified side  Rationale for Evaluation and Treatment: Rehabilitation  THERAPY DIAG:  Other low back pain  Other symptoms and signs involving the musculoskeletal system  Muscle weakness (generalized)  ONSET DATE: MD order 12/23/23  SUBJECTIVE:                                                                                                                                                                                           SUBJECTIVE STATEMENT:  Back still be hurting like crazy  EVAL: Not sure what's happening with my back, it just hurts. When my blood gets low in the past my back hurt along with hit, when I got blood my back felt better. This time the pain stayed, its the lower part of my back. I'm OK as long as I'm sitting down but when I get up and move around it bothers me. Leaning forward takes pressure off my back.   PERTINENT HISTORY:  See above   PAIN:  Are you having pain? No pain unless she moving  PRECAUTIONS: None  RED FLAGS: None   WEIGHT BEARING RESTRICTIONS: No  FALLS:  Has patient fallen in last 6 months? 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  DIAGNOSTIC FINDINGS:   Narrative & Impression  MR LUMBAR SPINE WITHOUT IV CONTRAST   COMPARISON: None available   CLINICAL HISTORY: None available   TECHNIQUE: SAG T2, SAG T1, SAG STIR, AX T2, AX T1 without IV contrast.   FINDINGS: There is normal alignment of the lumbar spine. Mild multilevel disc desiccation and mild-to-moderate facet arthrosis. There is no vertebral body height loss, subluxation or marrow replacing process. The sacrum and SI joints are unremarkable so far as visualized. Conus and cauda equina are unremarkable.   T12-L1: There is no focal disc protrusion, foraminal or spinal stenosis.   L1-2: There is no focal disc protrusion, foraminal or spinal stenosis. Mild facet arthrosis.   L2-3: Mild broad-based bulge slightly effacing the ventral thecal sac. Moderate facet arthrosis bilaterally. There is caudal foraminal narrowing, left slightly greater than right. No impingement of the descending nerve roots or exiting nerves.   L3-4: Mild disc desiccation moderate facet arthrosis. No significant foraminal or spinal stenosis.   L4-5: Broad-based disc osteophyte moderate facet arthrosis. No significant foraminal or spinal stenosis.   L5-S1: There is a transitional vertebral level on the left with fusion of the L5 vertebral body in the S1 vertebral body best seen on sagittal image 14 and 15. No significant foraminal or spinal stenosis. Mild facet arthrosis is   There is a large infrarenal abdominal aortic aneurysm measuring 5.4 cm in AP dimension, 5.5 cm in transverse dimension and 10 cm in length. There is an endoluminal stent graft in place. Size of the  native aneurysm is relatively stable when compared with the prior CTA.   IMPRESSION: Mild degenerative disc desiccation facet arthrosis. No significant foraminal or spinal stenosis. Mild multilevel facet arthrosis most notably on the right at L3-4.    PATIENT SURVEYS:  PSFS: THE PATIENT SPECIFIC FUNCTIONAL SCALE  Place score of 0-10 (0 = unable to perform activity and 10 = able to perform activity at the same level as before injury or problem)  Activity Date: 01/12/24 02/15/24   Standing up for long periods  0 3   2. Walking for long periods  0 3   3.     4.      Total Score 0 3     Total Score = Sum of activity scores/number of activities  Minimally Detectable Change: 3 points (for single activity); 2 points (for average score)  [facility] (nd). The Patient Specific Functional Scale . Retrieved from [email or web address]   COGNITION: Overall cognitive status: Within functional limits for tasks assessed     SENSATION: [facility] Not tested  MUSCLE LENGTH:  Unable to assess, pt resisted passive movements for mm flexibility checks   POSTURE: rounded shoulders, forward head, increased lumbar lordosis, and decreased thoracic kyphosis   LUMBAR ROM:   AROM eval 8/292/5  Flexion 50% limited  50% limited  Extension 75% limited  50% limited  Right lateral flexion 75% limited  50% limited  Left lateral flexion 75% limited  50% limited  Right rotation 50% limited    Left rotation 50% limited     (Blank rows = not  tested)    LOWER EXTREMITY MMT:    MMT Right eval Left eval RT/Left 01/29/24  Hip flexion 3- 3- 3/3+  Hip extension     Hip abduction     Hip adduction     Hip internal rotation     Hip external rotation     Knee flexion 4 4   Knee extension 4 4   Ankle dorsiflexion     Ankle plantarflexion     Ankle inversion     Ankle eversion      (Blank rows = not tested)    TREATMENT DATE:   02/15/24 Gait 3 laps ~ 320 feet Without AD stopped due to fatigue  Sit to stand 2x10 chest press w/ yellow ball  Rows & lats 20lb 2x10 Shoulder Ext 5lb 2x10 HS curls 20lb 2x10 Leg Ext 5lb 2x10 Hip abduction ball squeeze 2x10    02/10/24 Gait 3 laps ~ 320 feet Without AD  Sit to stand x10  Chest press w/ red ball  Rows & Ext blue 2x10  Some back pain Pball roll outs lumbar stretch HS curls 20lb 2x10 Leg Ext unable to complete  LAQ AROM 2x10  02/08/24: Instructed in seated lumbar flexion, rotation, B hands on cane , trial after she awakens to improve lumbar motion prior to standing, walking activities Inst in standing partial lunge with ant hip jt stretch, alt sides 10 to 15 sec hold Nustep L5 x 7 min Ue's and LE's Seated red t band rows 15 x Seated alt shoulder ext with red t band 15 x  Standing alt hamstring curls with replication of pain lower back so dc'd Marching with B hands supported on ll bars with 1 1/2# cuff wts 15x each Seated hip abd with blue t band around knees 15x  01/29/24 Nustep L 6 Checked Goals and ROM and MMT Blue tband trunk ext 2 sets 10 Isometric abdominal squeeze 2 sets 10 with ball Standing red tband row and shld ext 2 sets 10 STS yellow ball chest press 10 x Red tband standing SL hip flex,ext and abd 10 x each ADDED to HEP -see below  01/21/24  Nustep L2 x8 minutes seat 9, all four extremities Goal check/added LTG for balance    Seated TA set 15x3 seconds  Seated TA set + march x15 B Hip hike x10 B LAQs green TB x10 B (alternating)  HS green TB x10 B (alternating) Seated hip ABD green TB x15  One step on 4 inch step/other on floor 3x30 seconds B  Narrow BOS on blue foam circle 2x30 seconds     PATIENT EDUCATION:  Education details: exam findings, POC, HEP  Person educated: Patient Education method: Explanation, Demonstration, and Handouts Education comprehension: verbalized understanding, returned demonstration, and needs further  education  HOME EXERCISE PROGRAM:  Access Code: [email or web address] URL: [email or web address] Date: 01/29/2024 Prepared by: Angela Payseur  Exercises - Standing Hip Flexion with Resistance Loop  - 1 x daily - 7 x weekly - 1 sets - 10 reps - Hip Abduction with Resistance Loop  - 1 x daily - 7 x weekly - 1 sets - 10 reps - Hip Extension with Resistance Loop  - 1 x daily - 7 x weekly - 1 sets - 10 reps - Sit to Stand  - 1 x daily - 7 x weekly - 1 sets - 10 reps - Seated Hip Flexion  - 1 x daily - 7 x weekly - 2 sets - 10 reps -  Seated Long Arc Quad  - 1 x daily - 7 x weekly - 2 sets - 10 reps - Seated Hip Abduction with Resistance  - 1 x daily - 7 x weekly - 2 sets - 10 reps  Access Code: [email or web address] URL: [email or web address] Date: 01/21/2024 Prepared by: Josette Rough  Exercises - Seated Flexion Stretch  - 2 x daily - 7 x weekly - 1 sets - 10 reps - 3 seconds  hold - Seated Sidebending  - 2 x daily - 7 x weekly - 1 sets - 10 reps - 3 seconds  hold - Seated Trunk Rotation - Arms Crossed  - 2 x daily - 7 x weekly - 1 sets - 10 reps - 3 seconds  hold - Standing Transverse Abdominis Contraction  - 2 x daily - 7 x weekly - 1 sets - 15 reps - 3 seconds  hold - Seated Transversus Abdominus Bracing + March  - 2 x daily - 7 x weekly - 1 sets - 10 reps - Standing Hip Hiking  - 2 x daily - 7 x weekly - 1 sets - 10 reps  ASSESSMENT:  CLINICAL IMPRESSION: Pt enters doing well with no pain.  No pain today  during gait trial, stopped due to fatigue. Progressed to machine level interventions without issue.  Cues for full ROM needed with curls and extensions. She tolerated most exercises well, would benefit from continued PT. Encourage pt to be ore active at home.   OBJECTIVE IMPAIRMENTS: Abnormal gait, decreased activity tolerance, decreased mobility, difficulty walking, decreased ROM, decreased strength, hypomobility, increased fascial restrictions, increased muscle spasms,  impaired flexibility, obesity, and pain.   ACTIVITY LIMITATIONS: carrying, lifting, standing, squatting, stairs, transfers, hygiene/grooming, and locomotion level  PARTICIPATION LIMITATIONS: meal prep, cleaning, laundry, driving, shopping, community activity, and yard work  PERSONAL FACTORS: Age, Behavior pattern, Education, Fitness, and Past/current experiences are also affecting patient's functional outcome.   REHAB POTENTIAL: Fair chronicity of pain, low health literacy   CLINICAL DECISION MAKING: Stable/uncomplicated  EVALUATION COMPLEXITY: Low   GOALS: Goals reviewed with patient? No  SHORT TERM GOALS: Target date: 02/16/2024    Will be compliant with appropriate progressive HEP  Baseline:// Goal status: 01/29/24 MET  2.  Lumbar AROM to be no more than 25% limited all planes  Baseline:  Goal status: progressing 01/29/24  3.  Back pain to be no more than 7/10 at worst  Baseline:  Goal status: MET 01/26/24  4.  Will demonstrate improved awareness of functional posture  Baseline:  Goal status: ONGOING 01/21/24  and 01/29/24    LONG TERM GOALS: Target date: 03/22/2024    MMT to have improved by at least 1 grade all weak groups  Baseline:  Goal status: INITIAL  2.  Will be able to stand and cook/perform house work as desired for at least 45 minutes before back pain increases  Baseline:  Goal status: INITIAL  3.  Will be able to complete a grocery shopping trip of 30-45 minutes in duration before back pain increases  Baseline:  Goal status: ongoing 02/10/24  4.  Back pain to be no more than 5/10 at worst  Baseline:  Goal status: ongoing 02/10/24  5.  PSFS to have improved by at least 2 points  Baseline:  Goal status: Met 02/15/27  6. Will score at least 48 on Berg to show improved functional balance  Goal status: NEW 01/21/24    PLAN:  PT FREQUENCY: 2x/week  PT DURATION: 10 weeks  PLANNED INTERVENTIONS: 97750- Physical Performance Testing,  97110-Therapeutic exercises, 97530- Therapeutic activity, [id number]- Neuromuscular re-education, 97535- Self Care, [id number]- Manual therapy, [id number]- Gait training, and [id number]- Aquatic Therapy.  PLAN FOR NEXT SESSION: Can't tolerate laying supine.  Continue to progress strength and balance.   Greig Credit, PT, DPT, OCS 02/15/24 9:31 AM

## 2024-02-17 ENCOUNTER — Encounter: Payer: Self-pay | Admitting: Physical Therapy

## 2024-02-17 ENCOUNTER — Ambulatory Visit: Admitting: Physical Therapy

## 2024-02-17 DIAGNOSIS — R29898 Other symptoms and signs involving the musculoskeletal system: Secondary | ICD-10-CM

## 2024-02-17 DIAGNOSIS — M6281 Muscle weakness (generalized): Secondary | ICD-10-CM | POA: Diagnosis not present

## 2024-02-17 DIAGNOSIS — M5459 Other low back pain: Secondary | ICD-10-CM

## 2024-02-17 NOTE — Therapy (Signed)
 OUTPATIENT PHYSICAL THERAPY THORACOLUMBAR TREATMENT    Patient Name: Lacey Vega MRN: [id number] DOB:April 06, 1951, 73 y.o., female Today's Date: 02/17/2024  END OF SESSION:  PT End of Session - 02/17/24 0932     Visit Number 8    Date for PT Re-Evaluation 03/22/24    PT Start Time 0932    PT Stop Time 1015    PT Time Calculation (min) 43 min    Activity Tolerance Patient tolerated treatment well    Behavior During Therapy WFL for tasks assessed/performed            Past Medical History:  Diagnosis Date   Anemia    Anxiety    Asthma    Asthma    Bronchitis    hx   Colon polyps    hyperplastic   Diabetes (HCC)    mild, history of   DJD (degenerative joint disease), cervical    Dysphagia    history of   Dyspnea    Gallstones    GERD (gastroesophageal reflux disease)    H/O chest pain    secondary to anemia   Headache    History of arteriovenous malformation (AVM)    History of blood transfusion 10/2021   History of hiatal hernia    History of tobacco use    Hyperlipemia    Hypertension    Hypothyroidism    Iron  deficiency anemia    Sickle cell anemia (HCC)    patient is not aware of this   Thyroid  disease    TIA (transient ischemic attack)    was told that she possible had one   Wears dentures    Wears glasses    Past Surgical History:  Procedure Laterality Date   ABDOMINAL AORTIC ENDOVASCULAR STENT GRAFT Bilateral 01/31/2021   Procedure: ENDOVASCULAR ANEURYSM REPAIR (EVAR)Bilateral Groin Cutdown, left femoral endaterectomy with bovine patch angioplasty.;  Surgeon: Magda Debby SAILOR, MD;  Location: [facility] OR;  Service: Vascular;  Laterality: Bilateral;   ABDOMINAL HYSTERECTOMY     ANTERIOR CERVICAL DECOMP/DISCECTOMY FUSION N/A 10/03/2016   Procedure: ANTERIOR CERVICAL DECOMPRESSION/DISCECTOMY FUSION CERVICAL THREE- CERVICAL FOUR;  Surgeon: Gillie Duncans, MD;  Location: [facility] OR;  Service: Neurosurgery;  Laterality: N/A;  Right side approach   BOTOX   INJECTION  08/29/2015   Procedure: BOTOX  INJECTION;  Surgeon: Jerrell Sol, MD;  Location: [facility] ENDOSCOPY;  Service: Endoscopy;;   CHOLECYSTECTOMY     COLONOSCOPY  12/03/2011   Procedure: COLONOSCOPY;  Surgeon: Jerrell KYM Sol, MD;  Location: [facility] ENDOSCOPY;  Service: Endoscopy;  Laterality: N/A;   COLONOSCOPY N/A 11/27/2021   Procedure: COLONOSCOPY;  Surgeon: Federico Rosario BROCKS, MD;  Location: [facility];  Service: Gastroenterology;  Laterality: N/A;   ENTEROSCOPY N/A 07/07/2021   Procedure: ENTEROSCOPY;  Surgeon: Rosalie Kitchens, MD;  Location: [facility] ENDOSCOPY;  Service: Endoscopy;  Laterality: N/A;   ENTEROSCOPY N/A 08/06/2021   Procedure: ENTEROSCOPY;  Surgeon: Saintclair Jasper, MD;  Location: [facility] ENDOSCOPY;  Service: Gastroenterology;  Laterality: N/A;   ENTEROSCOPY N/A 11/27/2021   Procedure: SMALL  BOWEL ENTEROSCOPY;  Surgeon: Federico Rosario BROCKS, MD;  Location: [facility]OS[facility];  Service: Gastroenterology;  Laterality: N/A;   ENTEROSCOPY N/A 11/07/2022   Procedure: ENTEROSCOPY;  Surgeon: Charlanne Groom, MD;  Location: [facility];  Service: Gastroenterology;  Laterality: N/A;   ENTEROSCOPY N/A 04/03/2023   Procedure: ENTEROSCOPY;  Surgeon: Avram Lupita BRAVO, MD;  Location: [facility];  Service: Gastroenterology;  Laterality: N/A;   ENTEROSCOPY N/A 12/05/2023   Procedure: ENTEROSCOPY;  Surgeon: Federico Rosario BROCKS,  MD;  Location: [facility] ENDOS[facility];  Service: Gastroenterology;  Laterality: N/A;   ESOPHAGEAL MANOMETRY N/A 07/13/2017   Procedure: ESOPHAGEAL MANOMETRY (EM);  Surgeon: Dianna Specking, MD;  Location: [facility];  Service: Endoscopy;  Laterality: N/A;   ESOPHAGOGASTRODUODENOSCOPY  12/03/2011   Procedure: ESOPHAGOGASTRODUODENOSCOPY (EGD);  Surgeon: Specking KYM Dianna, MD;  Location: [facility];  Service: Endoscopy;  Laterality: N/A;   ESOPHAGOGASTRODUODENOSCOPY (EGD) WITH PROPOFOL  N/A 08/29/2015   Procedure: ESOPHAGOGASTRODUODENOSCOPY (EGD) WITH PROPOFOL ;  Surgeon: Specking Dianna, MD;  Location: [facility] ENDOSCOPY;   Service: Endoscopy;  Laterality: N/A;   ESOPHAGOGASTRODUODENOSCOPY (EGD) WITH PROPOFOL  N/A 05/17/2019   Procedure: ESOPHAGOGASTRODUODENOSCOPY (EGD) WITH PROPOFOL ;  Surgeon: Dianna Specking, MD;  Location: [facility];  Service: Endoscopy;  Laterality: N/A;   ESOPHAGOGASTRODUODENOSCOPY (EGD) WITH PROPOFOL  N/A 04/24/2021   Procedure: ESOPHAGOGASTRODUODENOSCOPY (EGD) WITH PROPOFOL ;  Surgeon: Elicia Claw, MD;  Location: [facility] ENDOSCOPY;  Service: Gastroenterology;  Laterality: N/A;   GIVENS CAPSULE STUDY N/A 08/05/2018   Procedure: GIVENS CAPSULE STUDY;  Surgeon: Dianna Specking, MD;  Location: [facility] ENDOSCOPY;  Service: Endoscopy;  Laterality: N/A;   GIVENS CAPSULE STUDY N/A 07/06/2021   Procedure: GIVENS CAPSULE STUDY;  Surgeon: Rosalie Kitchens, MD;  Location: [facility]OS[facility];  Service: Endoscopy;  Laterality: N/A;   HOT HEMOSTASIS  12/03/2011   Procedure: HOT HEMOSTASIS (ARGON PLASMA COAGULATION/BICAP);  Surgeon: Specking KYM Dianna, MD;  Location: [facility];  Service: Endoscopy;  Laterality: N/A;   HOT HEMOSTASIS N/A 07/07/2021   Procedure: HOT HEMOSTASIS (ARGON PLASMA COAGULATION/BICAP);  Surgeon: Rosalie Kitchens, MD;  Location: [facility];  Service: Endoscopy;  Laterality: N/A;   HOT HEMOSTASIS N/A 08/06/2021   Procedure: HOT HEMOSTASIS (ARGON PLASMA COAGULATION/BICAP);  Surgeon: Saintclair Jasper, MD;  Location: [facility];  Service: Gastroenterology;  Laterality: N/A;   HOT HEMOSTASIS  11/27/2021   Procedure: HOT HEMOSTASIS (ARGON PLASMA COAGULATION/BICAP);  Surgeon: Federico Rosario BROCKS, MD;  Location: [facility];  Service: Gastroenterology;;  EGD and COLON   HOT HEMOSTASIS N/A 11/07/2022   Procedure: HOT HEMOSTASIS (ARGON PLASMA COAGULATION/BICAP);  Surgeon: Charlanne Groom, MD;  Location: [facility];  Service: Gastroenterology;  Laterality: N/A;   HOT HEMOSTASIS N/A 04/03/2023   Procedure: HOT HEMOSTASIS (ARGON PLASMA COAGULATION/BICAP);  Surgeon: Avram Lupita BRAVO, MD;  Location: [facility];  Service:  Gastroenterology;  Laterality: N/A;   HOT HEMOSTASIS N/A 12/05/2023   Procedure: EGD, WITH ARGON PLASMA COAGULATION;  Surgeon: Federico Rosario BROCKS, MD;  Location: [facility]COPY;  Service: Gastroenterology;  Laterality: N/A;   NECK SURGERY     PERIPHERAL VASCULAR INTERVENTION  03/29/2021   Procedure: PERIPHERAL VASCULAR INTERVENTION;  Surgeon: Magda Debby SAILOR, MD;  Location: [facility] INVASIVE CV LAB;  Service: Cardiovascular;;  Lt SFA Brachial Approach   POLYPECTOMY  11/27/2021   Procedure: POLYPECTOMY;  Surgeon: Federico Rosario BROCKS, MD;  Location: [facility];  Service: Gastroenterology;;   ROTATOR CUFF REPAIR     right   ROTATOR CUFF REPAIR Right 2014   THROMBECTOMY FEMORAL ARTERY Left 01/31/2021   Procedure: THROMBECTOMY POPLITEAL  ARTERY;  Surgeon: Magda Debby SAILOR, MD;  Location: [facility] OR;  Service: Vascular;  Laterality: Left;   TONSILLECTOMY     Patient Active Problem List   Diagnosis Date Noted   Upper GI bleed 12/04/2023   History of angiodysplasia of intestinal tract 12/04/2023   Chronic kidney disease, stage 3b (HCC) 12/04/2023   Chest pain 12/04/2023   Hypocalcemia 12/04/2023   Thrombocytopenia (HCC) 12/04/2023   Cognitive impairment 12/04/2023   GERD (gastroesophageal reflux disease) 12/04/2023   Obesity (BMI 30-39.9) 12/04/2023  Melena 04/02/2023   Symptomatic anemia 04/02/2023   Angiodysplasia of stomach and duodenum 04/02/2023   GI bleed 04/01/2023   Chronic kidney disease, stage 3a (HCC) 07/05/2021   Iron  deficiency anemia due to chronic blood loss 05/09/2021   Acute on chronic blood loss anemia 04/22/2021   AKI (acute kidney injury) (HCC) 04/22/2021   Epistaxis 04/22/2021   PAD (peripheral artery disease) (HCC) 04/22/2021   HLD (hyperlipidemia) 04/22/2021   Sickle cell trait (HCC) 04/22/2021   AAA (abdominal aortic aneurysm) 02/01/2021   Aortic aneurysm (HCC) 01/31/2021   Achalasia 10/11/2017   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   DJD (degenerative joint  disease), cervical    Iron  deficiency anemia    History of arteriovenous malformation (AVM)    History of tobacco use    Asthma    H/O chest pain    Diabetes (HCC)    Dysphagia    Iron  deficiency anemia, unspecified 12/03/2011   Hypothyroidism (acquired) 10/07/2010    Class: Chronic   DYSPHAGIA UNSPECIFIED 10/05/2009   ANEMIA 08/20/2009   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   DM 04/17/2009   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/05/1990    Class: Chronic    PCP: Addie Locus MD   REFERRING PROVIDER: Gillie Duncans, MD  REFERRING DIAG: Diagnosis M54.40 (ICD-10-CM) - Lumbago with sciatica, unspecified side  Rationale for Evaluation and Treatment: Rehabilitation  THERAPY DIAG:  Other low back pain  Other symptoms and signs involving the musculoskeletal system  Muscle weakness (generalized)  ONSET DATE: MD order 12/23/23  SUBJECTIVE:                                                                                                                                                                                           SUBJECTIVE STATEMENT:  Tired before I left the house, too short of breath   EVAL: Not sure what's happening with my back, it just hurts. When my blood gets low in the past my back hurt along with hit, when I got blood my back felt better. This time the pain stayed, its the lower part of my back. I'm OK as long as I'm sitting down but when I get up and move around it bothers me. Leaning forward takes pressure off my back.   PERTINENT HISTORY:  See above   PAIN:  Are you having pain? No pain unless she moving  PRECAUTIONS: None  RED FLAGS: None   WEIGHT BEARING RESTRICTIONS: No  FALLS:  Has patient fallen in last 6 months? 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  DIAGNOSTIC FINDINGS:   Narrative & Impression  MR LUMBAR SPINE WITHOUT IV CONTRAST   COMPARISON: None available   CLINICAL HISTORY: None available   TECHNIQUE: SAG T2, SAG T1, SAG STIR, AX T2, AX T1 without IV contrast.   FINDINGS: There is normal alignment of the lumbar spine. Mild multilevel disc desiccation and mild-to-moderate facet arthrosis. There is no vertebral body height loss, subluxation or marrow replacing process. The sacrum and SI joints are unremarkable so far as visualized. Conus and cauda equina are unremarkable.   T12-L1: There is no focal disc protrusion, foraminal or spinal stenosis.   L1-2: There is no focal disc protrusion, foraminal or spinal stenosis. Mild facet arthrosis.   L2-3: Mild broad-based bulge slightly effacing the ventral thecal sac. Moderate facet arthrosis bilaterally. There is caudal foraminal narrowing, left slightly greater than right. No impingement of the descending nerve roots or exiting nerves.   L3-4: Mild disc desiccation moderate facet arthrosis. No significant foraminal or spinal stenosis.   L4-5: Broad-based disc osteophyte moderate facet arthrosis. No significant foraminal or spinal stenosis.   L5-S1: There is a transitional vertebral level on the left with fusion of the L5 vertebral body in the S1 vertebral body best seen on sagittal image 14 and 15. No significant foraminal or spinal stenosis. Mild facet arthrosis is   There is a large infrarenal abdominal aortic aneurysm measuring 5.4 cm in AP dimension, 5.5 cm in transverse dimension and 10 cm in length. There is an endoluminal stent graft in place.  Size of the native aneurysm is relatively stable when compared with the prior CTA.   IMPRESSION: Mild degenerative disc desiccation facet arthrosis. No significant foraminal or spinal stenosis. Mild multilevel facet arthrosis most notably on the right at L3-4.    PATIENT SURVEYS:  PSFS: THE PATIENT SPECIFIC FUNCTIONAL SCALE  Place score of 0-10 (0 = unable to perform activity and 10 = able to perform activity at the same level as before injury or problem)  Activity Date: 01/12/24 02/15/24   Standing up for long periods  0 3   2. Walking for long periods  0 3   3.     4.      Total Score 0 3     Total Score = Sum of activity scores/number of activities  Minimally Detectable Change: 3 points (for single activity); 2 points (for average score)  [facility] (nd). The Patient Specific Functional Scale . Retrieved from [email or web address]   COGNITION: Overall cognitive status: Within functional limits for tasks assessed     SENSATION: [facility] Not tested  MUSCLE LENGTH:  Unable to assess, pt resisted passive movements for mm flexibility checks   POSTURE: rounded shoulders, forward head, increased lumbar lordosis, and decreased thoracic kyphosis   LUMBAR ROM:   AROM eval 8/292/5  Flexion 50% limited  50% limited  Extension 75% limited  50% limited  Right lateral flexion 75% limited  50% limited  Left lateral flexion 75% limited  50% limited  Right rotation 50% limited    Left rotation 50% limited     (  Blank rows = not tested)    LOWER EXTREMITY MMT:    MMT Right eval Left eval RT/Left 01/29/24  Hip flexion 3- 3- 3/3+  Hip extension     Hip abduction     Hip adduction     Hip internal rotation     Hip external rotation     Knee flexion 4 4   Knee extension 4 4   Ankle dorsiflexion     Ankle plantarflexion     Ankle inversion     Ankle eversion      (Blank rows = not tested)    TREATMENT DATE:   02/17/24 Rows & lats 20lb 2x10 HS curls 20lb 2x10 Leg Ext 5lb 2x10 Shoulder Ext green 2x15 Sit to stand 2x10 chest press w/ yellow ball  Seated OHP yellow ball    02/15/24 Gait 3 laps ~ 320 feet Without AD stopped due to fatigue  Sit to stand 2x10 chest press w/ yellow ball  Rows & lats 20lb 2x10 Shoulder Ext 5lb 2x10 HS curls 20lb 2x10 Leg Ext 5lb 2x10 Hip abduction ball squeeze 2x10    02/10/24 Gait 3 laps ~ 320 feet Without AD  Sit to stand x10  Chest press w/ red ball  Rows & Ext blue 2x10  Some back pain Pball roll outs lumbar stretch HS curls 20lb 2x10 Leg Ext unable to complete  LAQ AROM 2x10  02/08/24: Instructed in seated lumbar flexion, rotation, B hands on cane , trial after she awakens to improve lumbar motion prior to standing, walking activities Inst in standing partial lunge with ant hip jt stretch, alt sides 10 to 15 sec hold Nustep L5 x 7 min Ue's and LE's Seated red t band rows 15 x Seated alt shoulder ext with red t band 15 x  Standing alt hamstring curls with replication of pain lower back so dc'd Marching with B hands supported on ll bars with 1 1/2# cuff wts 15x each Seated hip abd with blue t band around knees 15x  01/29/24 Nustep L 6 Checked Goals and ROM and MMT Blue tband trunk ext 2 sets 10 Isometric abdominal squeeze 2 sets 10 with ball Standing red tband row and shld ext 2 sets 10 STS yellow ball chest press 10 x Red tband standing SL hip flex,ext and abd 10 x each ADDED to HEP -see below  01/21/24  Nustep L2 x8 minutes seat 9, all four extremities Goal check/added LTG for balance    Seated TA set 15x3 seconds  Seated TA set + march x15 B Hip hike x10 B LAQs green TB x10 B (alternating)  HS green TB x10 B (alternating) Seated hip ABD green TB x15  One step on 4 inch step/other on floor 3x30 seconds B  Narrow BOS on blue foam circle 2x30 seconds     PATIENT EDUCATION:  Education details: exam findings, POC, HEP  Person  educated: Patient Education method: Explanation, Demonstration, and Handouts Education comprehension: verbalized understanding, returned demonstration, and needs further education  HOME EXERCISE PROGRAM:  Access Code: [email or web address] URL: [email or web address] Date: 01/29/2024 Prepared by: Angela Payseur  Exercises - Standing Hip Flexion with Resistance Loop  - 1 x daily - 7 x weekly - 1 sets - 10 reps - Hip Abduction with Resistance Loop  - 1 x daily - 7 x weekly - 1 sets - 10 reps - Hip Extension with Resistance Loop  - 1 x daily - 7 x weekly - 1 sets - 10 reps -  Sit to Stand  - 1 x daily - 7 x weekly - 1 sets - 10 reps - Seated Hip Flexion  - 1 x daily - 7 x weekly - 2 sets - 10 reps - Seated Long Arc Quad  - 1 x daily - 7 x weekly - 2 sets - 10 reps - Seated Hip Abduction with Resistance  - 1 x daily - 7 x weekly - 2 sets - 10 reps  Access Code: [email or web address] URL: [email or web address] Date: 01/21/2024 Prepared by: Josette Rough  Exercises - Seated Flexion Stretch  - 2 x daily - 7 x weekly - 1 sets - 10 reps - 3 seconds  hold - Seated Sidebending  - 2 x daily - 7 x weekly - 1 sets - 10 reps - 3 seconds  hold - Seated Trunk Rotation - Arms Crossed  - 2 x daily - 7 x weekly - 1 sets - 10 reps - 3 seconds  hold - Standing Transverse Abdominis Contraction  - 2 x daily - 7 x weekly - 1 sets - 15 reps - 3 seconds  hold - Seated Transversus Abdominus Bracing + March  - 2 x daily - 7 x weekly - 1 sets - 10 reps - Standing Hip Hiking  - 2 x daily - 7 x weekly - 1 sets - 10 reps  ASSESSMENT:  CLINICAL IMPRESSION: Pt enters with repots of fatigue and shortness of breath. She attributed her shortness in breath to gaining weight.  She did reports pain this morning when she woke up. She reports no change in her occasional pain since starting therapy.  She did not want to participate in aerobic warm up. But agreed to complete a few strengthening interventions.  Advised PT to  schedule appointment with MD.   OBJECTIVE IMPAIRMENTS: Abnormal gait, decreased activity tolerance, decreased mobility, difficulty walking, decreased ROM, decreased strength, hypomobility, increased fascial restrictions, increased muscle spasms, impaired flexibility, obesity, and pain.   ACTIVITY LIMITATIONS: carrying, lifting, standing, squatting, stairs, transfers, hygiene/grooming, and locomotion level  PARTICIPATION LIMITATIONS: meal prep, cleaning, laundry, driving, shopping, community activity, and yard work  PERSONAL FACTORS: Age, Behavior pattern, Education, Fitness, and Past/current experiences are also affecting patient's functional outcome.   REHAB POTENTIAL: Fair chronicity of pain, low health literacy   CLINICAL DECISION MAKING: Stable/uncomplicated  EVALUATION COMPLEXITY: Low   GOALS: Goals reviewed with patient? No  SHORT TERM GOALS: Target date: 02/16/2024    Will be compliant with appropriate progressive HEP  Baseline:// Goal status: 01/29/24 MET  2.  Lumbar AROM to be no more than 25% limited all planes  Baseline:  Goal status: progressing 01/29/24  3.  Back pain to be no more than 7/10 at worst  Baseline:  Goal status: MET 01/26/24  4.  Will demonstrate improved awareness of functional posture  Baseline:  Goal status: ONGOING 01/21/24  and 01/29/24    LONG TERM GOALS: Target date: 03/22/2024    MMT to have improved by at least 1 grade all weak groups  Baseline:  Goal status: INITIAL  2.  Will be able to stand and cook/perform house work as desired for at least 45 minutes before back pain increases  Baseline:  Goal status: INITIAL  3.  Will be able to complete a grocery shopping trip of 30-45 minutes in duration before back pain increases  Baseline:  Goal status: ongoing 02/10/24  4.  Back pain to be no more than 5/10 at worst  Baseline:  Goal status: ongoing 02/10/24  5.  PSFS to have improved by at least 2 points  Baseline:  Goal status: Met  02/15/27  6. Will score at least 48 on Berg to show improved functional balance  Goal status: NEW 01/21/24    PLAN:  PT FREQUENCY: 2x/week  PT DURATION: 10 weeks  PLANNED INTERVENTIONS: 97750- Physical Performance Testing, 97110-Therapeutic exercises, 97530- Therapeutic activity, 97112- Neuromuscular re-education, 97535- Self Care, 02859- Manual therapy, [id number]- Gait training, and [id number]- Aquatic Therapy.  PLAN FOR NEXT SESSION: Can't tolerate laying supine.  Continue to progress strength and balance.  Possible D/C  Tanda Sorrow, PTA  02/17/24 9:32 AM

## 2024-02-25 DIAGNOSIS — M5440 Lumbago with sciatica, unspecified side: Secondary | ICD-10-CM | POA: Diagnosis not present

## 2024-02-25 DIAGNOSIS — Z6832 Body mass index (BMI) 32.0-32.9, adult: Secondary | ICD-10-CM | POA: Diagnosis not present

## 2024-02-25 DIAGNOSIS — M48062 Spinal stenosis, lumbar region with neurogenic claudication: Secondary | ICD-10-CM | POA: Diagnosis not present

## 2024-02-26 ENCOUNTER — Ambulatory Visit (INDEPENDENT_AMBULATORY_CARE_PROVIDER_SITE_OTHER)

## 2024-02-26 VITALS — BP 110/65 | HR 65 | Temp 97.6°F | Resp 16 | Ht 64.0 in | Wt 194.8 lb

## 2024-02-26 DIAGNOSIS — D500 Iron deficiency anemia secondary to blood loss (chronic): Secondary | ICD-10-CM

## 2024-02-26 DIAGNOSIS — D509 Iron deficiency anemia, unspecified: Secondary | ICD-10-CM | POA: Diagnosis not present

## 2024-02-26 MED ORDER — IRON SUCROSE 20 MG/ML IV SOLN
200.0000 mg | Freq: Once | INTRAVENOUS | Status: AC
  Administered 2024-02-26: 200 mg via INTRAVENOUS
  Filled 2024-02-26: qty 10

## 2024-02-26 NOTE — Progress Notes (Signed)
 Diagnosis: Iron Deficiency Anemia  Provider:  Chilton Greathouse MD  Procedure: IV Push  IV Type: Peripheral, IV Location: L Antecubital  Venofer (Iron Sucrose), Dose: 200 mg  Post Infusion IV Care: Observation period completed and Peripheral IV Discontinued  Discharge: Condition: Good, Destination: Home . AVS Provided  Performed by:  Loney Hering, LPN

## 2024-03-04 ENCOUNTER — Ambulatory Visit

## 2024-03-04 VITALS — BP 131/65 | HR 66 | Temp 97.0°F | Resp 14 | Ht 64.0 in | Wt 192.6 lb

## 2024-03-04 DIAGNOSIS — D500 Iron deficiency anemia secondary to blood loss (chronic): Secondary | ICD-10-CM

## 2024-03-04 DIAGNOSIS — D509 Iron deficiency anemia, unspecified: Secondary | ICD-10-CM

## 2024-03-04 MED ORDER — IRON SUCROSE 20 MG/ML IV SOLN
200.0000 mg | Freq: Once | INTRAVENOUS | Status: AC
  Administered 2024-03-04: 200 mg via INTRAVENOUS
  Filled 2024-03-04: qty 10

## 2024-03-04 NOTE — Progress Notes (Signed)
 Diagnosis: Iron Deficiency Anemia  Provider:  Chilton Greathouse MD  Procedure: IV Push  IV Type: Peripheral, IV Location: L Antecubital  Venofer (Iron Sucrose), Dose: 200 mg  Post Infusion IV Care: Observation period completed and Peripheral IV Discontinued  Discharge: Condition: Stable, Destination: Home . AVS Provided  Performed by:  Wyvonne Lenz, RN

## 2024-03-08 ENCOUNTER — Inpatient Hospital Stay: Attending: Hematology and Oncology

## 2024-03-08 DIAGNOSIS — D500 Iron deficiency anemia secondary to blood loss (chronic): Secondary | ICD-10-CM | POA: Diagnosis not present

## 2024-03-08 DIAGNOSIS — D573 Sickle-cell trait: Secondary | ICD-10-CM | POA: Diagnosis not present

## 2024-03-08 LAB — CBC WITH DIFFERENTIAL (CANCER CENTER ONLY)
Abs Immature Granulocytes: 0.01 K/uL (ref 0.00–0.07)
Basophils Absolute: 0 K/uL (ref 0.0–0.1)
Basophils Relative: 1 %
Eosinophils Absolute: 0.4 K/uL (ref 0.0–0.5)
Eosinophils Relative: 6 %
HCT: 27.7 % — ABNORMAL LOW (ref 36.0–46.0)
Hemoglobin: 8.7 g/dL — ABNORMAL LOW (ref 12.0–15.0)
Immature Granulocytes: 0 %
Lymphocytes Relative: 18 %
Lymphs Abs: 1.1 K/uL (ref 0.7–4.0)
MCH: 25 pg — ABNORMAL LOW (ref 26.0–34.0)
MCHC: 31.4 g/dL (ref 30.0–36.0)
MCV: 79.6 fL — ABNORMAL LOW (ref 80.0–100.0)
Monocytes Absolute: 0.8 K/uL (ref 0.1–1.0)
Monocytes Relative: 14 %
Neutro Abs: 3.7 K/uL (ref 1.7–7.7)
Neutrophils Relative %: 61 %
Platelet Count: 299 K/uL (ref 150–400)
RBC: 3.48 MIL/uL — ABNORMAL LOW (ref 3.87–5.11)
RDW: 16.8 % — ABNORMAL HIGH (ref 11.5–15.5)
WBC Count: 6 K/uL (ref 4.0–10.5)
nRBC: 0 % (ref 0.0–0.2)

## 2024-03-08 LAB — CMP (CANCER CENTER ONLY)
ALT: 6 U/L (ref 0–44)
AST: 13 U/L — ABNORMAL LOW (ref 15–41)
Albumin: 4.1 g/dL (ref 3.5–5.0)
Alkaline Phosphatase: 118 U/L (ref 38–126)
Anion gap: 5 (ref 5–15)
BUN: 16 mg/dL (ref 8–23)
CO2: 27 mmol/L (ref 22–32)
Calcium: 9.2 mg/dL (ref 8.9–10.3)
Chloride: 111 mmol/L (ref 98–111)
Creatinine: 1.47 mg/dL — ABNORMAL HIGH (ref 0.44–1.00)
GFR, Estimated: 37 mL/min — ABNORMAL LOW (ref >60)
Glucose, Bld: 115 mg/dL — ABNORMAL HIGH (ref 70–99)
Potassium: 4.3 mmol/L (ref 3.5–5.1)
Sodium: 143 mmol/L (ref 135–145)
Total Bilirubin: 0.4 mg/dL (ref 0.0–1.2)
Total Protein: 7.3 g/dL (ref 6.5–8.1)

## 2024-03-08 LAB — RETIC PANEL
Immature Retic Fract: 27.3 % — ABNORMAL HIGH (ref 2.3–15.9)
RBC.: 3.47 MIL/uL — ABNORMAL LOW (ref 3.87–5.11)
Retic Count, Absolute: 64.9 K/uL (ref 19.0–186.0)
Retic Ct Pct: 1.9 % (ref 0.4–3.1)
Reticulocyte Hemoglobin: 28.8 pg (ref >27.9)

## 2024-03-08 LAB — IRON AND IRON BINDING CAPACITY (CC-WL,HP ONLY)
Iron: 46 ug/dL (ref 28–170)
Saturation Ratios: 12 % (ref 10.4–31.8)
TIBC: 393 ug/dL (ref 250–450)
UIBC: 347 ug/dL (ref 148–442)

## 2024-03-08 LAB — FERRITIN: Ferritin: 244 ng/mL (ref 11–307)

## 2024-03-09 ENCOUNTER — Encounter: Payer: Self-pay | Admitting: *Deleted

## 2024-03-09 NOTE — Progress Notes (Signed)
 Lacey Vega                                          MRN: [id number]   03/09/2024   The [facility] Quality Team Specialist reviewed this patient medical record for the purposes of chart review for care gap closure. The following were reviewed: chart review for care gap closure-diabetic eye exam and kidney health evaluation for diabetes:eGFR  and uACR.  No uACR found  [facility] Quality Team

## 2024-03-09 NOTE — Progress Notes (Signed)
 Lacey Vega                                          MRN: [id number]   03/09/2024   The [facility] Quality Team Specialist reviewed this patient medical record for the purposes of chart review for care gap closure. The following were reviewed: chart review for care gap closure-diabetic eye exam and kidney health evaluation for diabetes:eGFR  and uACR.  No uACR found to close gap  [facility] Quality Team

## 2024-03-11 ENCOUNTER — Ambulatory Visit

## 2024-03-11 VITALS — BP 122/67 | HR 65 | Temp 98.1°F | Resp 18 | Ht 64.0 in | Wt 195.2 lb

## 2024-03-11 DIAGNOSIS — D509 Iron deficiency anemia, unspecified: Secondary | ICD-10-CM

## 2024-03-11 DIAGNOSIS — D500 Iron deficiency anemia secondary to blood loss (chronic): Secondary | ICD-10-CM

## 2024-03-11 MED ORDER — IRON SUCROSE 20 MG/ML IV SOLN
200.0000 mg | Freq: Once | INTRAVENOUS | Status: AC
  Administered 2024-03-11: 200 mg via INTRAVENOUS
  Filled 2024-03-11: qty 10

## 2024-03-11 NOTE — Progress Notes (Signed)
 Diagnosis: Iron Deficiency Anemia  Provider:  Chilton Greathouse MD  Procedure: IV Push  IV Type: Peripheral, IV Location: L Antecubital  Venofer (Iron Sucrose), Dose: 200 mg  Post Infusion IV Care: Observation period completed and Peripheral IV Discontinued  Discharge: Condition: Good, Destination: Home . AVS Declined  Performed by:  Adriana Mccallum, RN

## 2024-03-14 DIAGNOSIS — I080 Rheumatic disorders of both mitral and aortic valves: Secondary | ICD-10-CM | POA: Diagnosis not present

## 2024-03-14 DIAGNOSIS — I519 Heart disease, unspecified: Secondary | ICD-10-CM | POA: Diagnosis not present

## 2024-03-14 DIAGNOSIS — I10 Essential (primary) hypertension: Secondary | ICD-10-CM | POA: Diagnosis not present

## 2024-03-14 DIAGNOSIS — E119 Type 2 diabetes mellitus without complications: Secondary | ICD-10-CM | POA: Diagnosis not present

## 2024-03-18 ENCOUNTER — Ambulatory Visit (INDEPENDENT_AMBULATORY_CARE_PROVIDER_SITE_OTHER)

## 2024-03-18 VITALS — BP 124/67 | HR 67 | Temp 97.4°F | Resp 16 | Ht 64.0 in | Wt 196.4 lb

## 2024-03-18 DIAGNOSIS — D500 Iron deficiency anemia secondary to blood loss (chronic): Secondary | ICD-10-CM

## 2024-03-18 DIAGNOSIS — D509 Iron deficiency anemia, unspecified: Secondary | ICD-10-CM | POA: Diagnosis not present

## 2024-03-18 MED ORDER — IRON SUCROSE 20 MG/ML IV SOLN
200.0000 mg | Freq: Once | INTRAVENOUS | Status: AC
  Administered 2024-03-18: 200 mg via INTRAVENOUS
  Filled 2024-03-18: qty 10

## 2024-03-18 NOTE — Progress Notes (Signed)
 Diagnosis: Iron Deficiency Anemia  Provider:  Chilton Greathouse MD  Procedure: IV Push  IV Type: Peripheral, IV Location: L Antecubital  Venofer (Iron Sucrose), Dose: 200 mg  Post Infusion IV Care: Observation period completed and Peripheral IV Discontinued  Discharge: Condition: Stable, Destination: Home . AVS Provided  Performed by:  Wyvonne Lenz, RN

## 2024-03-25 ENCOUNTER — Ambulatory Visit (INDEPENDENT_AMBULATORY_CARE_PROVIDER_SITE_OTHER)

## 2024-03-25 VITALS — BP 130/63 | HR 66 | Temp 98.2°F | Resp 18 | Ht 64.0 in | Wt 195.4 lb

## 2024-03-25 DIAGNOSIS — D509 Iron deficiency anemia, unspecified: Secondary | ICD-10-CM

## 2024-03-25 DIAGNOSIS — D500 Iron deficiency anemia secondary to blood loss (chronic): Secondary | ICD-10-CM

## 2024-03-25 MED ORDER — IRON SUCROSE 20 MG/ML IV SOLN
200.0000 mg | Freq: Once | INTRAVENOUS | Status: AC
  Administered 2024-03-25: 200 mg via INTRAVENOUS
  Filled 2024-03-25: qty 10

## 2024-03-25 NOTE — Progress Notes (Signed)
 Diagnosis: Iron Deficiency Anemia  Provider:  Chilton Greathouse MD  Procedure: IV Push  IV Type: Peripheral, IV Location: L Antecubital  Venofer (Iron Sucrose), Dose: 200 mg  Post Infusion IV Care: Observation period completed and Peripheral IV Discontinued  Discharge: Condition: Good, Destination: Home . AVS Declined  Performed by:  Marlow Baars [place], RN

## 2024-03-31 ENCOUNTER — Telehealth: Payer: Self-pay

## 2024-03-31 NOTE — Telephone Encounter (Signed)
 Auth Submission: APPROVED Site of care: Site of care: [facility] Payer: [employer] medicare Medication & CPT/J Code(s) submitted: Feraheme  (ferumoxytol ) [id number] Diagnosis Code:  Route of submission (phone, fax, portal): auto renewal Phone # Fax # Auth type: Buy/Bill PB Units/visits requested: 510mg  Reference number: [id number] Approval from: 06/02/24 to 06/01/25

## 2024-04-05 ENCOUNTER — Inpatient Hospital Stay: Attending: Hematology and Oncology

## 2024-04-05 DIAGNOSIS — D573 Sickle-cell trait: Secondary | ICD-10-CM | POA: Diagnosis not present

## 2024-04-05 DIAGNOSIS — D500 Iron deficiency anemia secondary to blood loss (chronic): Secondary | ICD-10-CM | POA: Insufficient documentation

## 2024-04-05 LAB — CMP (CANCER CENTER ONLY)
ALT: 5 U/L (ref 0–44)
AST: 13 U/L — ABNORMAL LOW (ref 15–41)
Albumin: 4.1 g/dL (ref 3.5–5.0)
Alkaline Phosphatase: 118 U/L (ref 38–126)
Anion gap: 5 (ref 5–15)
BUN: 13 mg/dL (ref 8–23)
CO2: 28 mmol/L (ref 22–32)
Calcium: 9.1 mg/dL (ref 8.9–10.3)
Chloride: 110 mmol/L (ref 98–111)
Creatinine: 1.23 mg/dL — ABNORMAL HIGH (ref 0.44–1.00)
GFR, Estimated: 46 mL/min — ABNORMAL LOW (ref >60)
Glucose, Bld: 95 mg/dL (ref 70–99)
Potassium: 4.2 mmol/L (ref 3.5–5.1)
Sodium: 143 mmol/L (ref 135–145)
Total Bilirubin: 0.5 mg/dL (ref 0.0–1.2)
Total Protein: 7.2 g/dL (ref 6.5–8.1)

## 2024-04-05 LAB — CBC WITH DIFFERENTIAL (CANCER CENTER ONLY)
Abs Immature Granulocytes: 0.01 K/uL (ref 0.00–0.07)
Basophils Absolute: 0 K/uL (ref 0.0–0.1)
Basophils Relative: 1 %
Eosinophils Absolute: 0.2 K/uL (ref 0.0–0.5)
Eosinophils Relative: 5 %
HCT: 33.4 % — ABNORMAL LOW (ref 36.0–46.0)
Hemoglobin: 10.4 g/dL — ABNORMAL LOW (ref 12.0–15.0)
Immature Granulocytes: 0 %
Lymphocytes Relative: 22 %
Lymphs Abs: 0.9 K/uL (ref 0.7–4.0)
MCH: 25.3 pg — ABNORMAL LOW (ref 26.0–34.0)
MCHC: 31.1 g/dL (ref 30.0–36.0)
MCV: 81.3 fL (ref 80.0–100.0)
Monocytes Absolute: 0.5 K/uL (ref 0.1–1.0)
Monocytes Relative: 11 %
Neutro Abs: 2.5 K/uL (ref 1.7–7.7)
Neutrophils Relative %: 61 %
Platelet Count: 270 K/uL (ref 150–400)
RBC: 4.11 MIL/uL (ref 3.87–5.11)
RDW: 17.6 % — ABNORMAL HIGH (ref 11.5–15.5)
WBC Count: 4.2 K/uL (ref 4.0–10.5)
nRBC: 0 % (ref 0.0–0.2)

## 2024-04-05 LAB — RETIC PANEL
Immature Retic Fract: 13.2 % (ref 2.3–15.9)
RBC.: 4.08 MIL/uL (ref 3.87–5.11)
Retic Count, Absolute: 48.1 K/uL (ref 19.0–186.0)
Retic Ct Pct: 1.2 % (ref 0.4–3.1)
Reticulocyte Hemoglobin: 29.5 pg (ref >27.9)

## 2024-04-05 LAB — IRON AND IRON BINDING CAPACITY (CC-WL,HP ONLY)
Iron: 54 ug/dL (ref 28–170)
Saturation Ratios: 16 % (ref 10.4–31.8)
TIBC: 337 ug/dL (ref 250–450)
UIBC: 283 ug/dL (ref 148–442)

## 2024-04-05 LAB — FERRITIN: Ferritin: 242 ng/mL (ref 11–307)

## 2024-04-18 DIAGNOSIS — G3184 Mild cognitive impairment, so stated: Secondary | ICD-10-CM | POA: Diagnosis not present

## 2024-04-18 DIAGNOSIS — D509 Iron deficiency anemia, unspecified: Secondary | ICD-10-CM | POA: Diagnosis not present

## 2024-04-18 DIAGNOSIS — G3189 Other specified degenerative diseases of nervous system: Secondary | ICD-10-CM | POA: Diagnosis not present

## 2024-04-18 DIAGNOSIS — I714 Abdominal aortic aneurysm, without rupture, unspecified: Secondary | ICD-10-CM | POA: Diagnosis not present

## 2024-04-18 DIAGNOSIS — E782 Mixed hyperlipidemia: Secondary | ICD-10-CM | POA: Diagnosis not present

## 2024-04-18 DIAGNOSIS — K31811 Angiodysplasia of stomach and duodenum with bleeding: Secondary | ICD-10-CM | POA: Diagnosis not present

## 2024-04-18 DIAGNOSIS — E039 Hypothyroidism, unspecified: Secondary | ICD-10-CM | POA: Diagnosis not present

## 2024-04-18 DIAGNOSIS — E119 Type 2 diabetes mellitus without complications: Secondary | ICD-10-CM | POA: Diagnosis not present

## 2024-04-18 DIAGNOSIS — I7143 Infrarenal abdominal aortic aneurysm, without rupture: Secondary | ICD-10-CM | POA: Diagnosis not present

## 2024-04-18 DIAGNOSIS — I119 Hypertensive heart disease without heart failure: Secondary | ICD-10-CM | POA: Diagnosis not present

## 2024-04-18 DIAGNOSIS — N1831 Chronic kidney disease, stage 3a: Secondary | ICD-10-CM | POA: Diagnosis not present

## 2024-04-18 DIAGNOSIS — J449 Chronic obstructive pulmonary disease, unspecified: Secondary | ICD-10-CM | POA: Diagnosis not present

## 2024-05-04 ENCOUNTER — Inpatient Hospital Stay: Admitting: Hematology and Oncology

## 2024-05-04 ENCOUNTER — Inpatient Hospital Stay: Attending: Hematology and Oncology

## 2024-05-04 VITALS — BP 141/60 | HR 68 | Temp 98.3°F | Resp 14 | Wt 193.1 lb

## 2024-05-04 DIAGNOSIS — D573 Sickle-cell trait: Secondary | ICD-10-CM | POA: Diagnosis not present

## 2024-05-04 DIAGNOSIS — D500 Iron deficiency anemia secondary to blood loss (chronic): Secondary | ICD-10-CM

## 2024-05-04 DIAGNOSIS — Z87891 Personal history of nicotine dependence: Secondary | ICD-10-CM | POA: Insufficient documentation

## 2024-05-04 DIAGNOSIS — K31811 Angiodysplasia of stomach and duodenum with bleeding: Secondary | ICD-10-CM | POA: Diagnosis not present

## 2024-05-04 LAB — CMP (CANCER CENTER ONLY)
ALT: 8 U/L (ref 0–44)
AST: 19 U/L (ref 15–41)
Albumin: 4.2 g/dL (ref 3.5–5.0)
Alkaline Phosphatase: 140 U/L — ABNORMAL HIGH (ref 38–126)
Anion gap: 10 (ref 5–15)
BUN: 15 mg/dL (ref 8–23)
CO2: 28 mmol/L (ref 22–32)
Calcium: 9.6 mg/dL (ref 8.9–10.3)
Chloride: 108 mmol/L (ref 98–111)
Creatinine: 1.28 mg/dL — ABNORMAL HIGH (ref 0.44–1.00)
GFR, Estimated: 44 mL/min — ABNORMAL LOW (ref >60)
Glucose, Bld: 115 mg/dL — ABNORMAL HIGH (ref 70–99)
Potassium: 3.9 mmol/L (ref 3.5–5.1)
Sodium: 145 mmol/L (ref 135–145)
Total Bilirubin: 0.8 mg/dL (ref 0.0–1.2)
Total Protein: 7.3 g/dL (ref 6.5–8.1)

## 2024-05-04 LAB — RETIC PANEL
Immature Retic Fract: 14.6 % (ref 2.3–15.9)
RBC.: 4.19 MIL/uL (ref 3.87–5.11)
Retic Count, Absolute: 48.2 K/uL (ref 19.0–186.0)
Retic Ct Pct: 1.2 % (ref 0.4–3.1)
Reticulocyte Hemoglobin: 27.6 pg — ABNORMAL LOW (ref >27.9)

## 2024-05-04 LAB — CBC WITH DIFFERENTIAL (CANCER CENTER ONLY)
Abs Immature Granulocytes: 0.01 K/uL (ref 0.00–0.07)
Basophils Absolute: 0 K/uL (ref 0.0–0.1)
Basophils Relative: 1 %
Eosinophils Absolute: 0.2 K/uL (ref 0.0–0.5)
Eosinophils Relative: 4 %
HCT: 34 % — ABNORMAL LOW (ref 36.0–46.0)
Hemoglobin: 10.6 g/dL — ABNORMAL LOW (ref 12.0–15.0)
Immature Granulocytes: 0 %
Lymphocytes Relative: 18 %
Lymphs Abs: 0.9 K/uL (ref 0.7–4.0)
MCH: 25.2 pg — ABNORMAL LOW (ref 26.0–34.0)
MCHC: 31.2 g/dL (ref 30.0–36.0)
MCV: 81 fL (ref 80.0–100.0)
Monocytes Absolute: 0.6 K/uL (ref 0.1–1.0)
Monocytes Relative: 12 %
Neutro Abs: 3.3 K/uL (ref 1.7–7.7)
Neutrophils Relative %: 65 %
Platelet Count: 254 K/uL (ref 150–400)
RBC: 4.2 MIL/uL (ref 3.87–5.11)
RDW: 16.9 % — ABNORMAL HIGH (ref 11.5–15.5)
WBC Count: 5.1 K/uL (ref 4.0–10.5)
nRBC: 0 % (ref 0.0–0.2)

## 2024-05-04 LAB — IRON AND IRON BINDING CAPACITY (CC-WL,HP ONLY)
Iron: 44 ug/dL (ref 28–170)
Saturation Ratios: 13 % (ref 10.4–31.8)
TIBC: 337 ug/dL (ref 250–450)
UIBC: 294 ug/dL

## 2024-05-04 LAB — FERRITIN: Ferritin: 199 ng/mL (ref 11–307)

## 2024-05-04 NOTE — Progress Notes (Signed)
 [facility] [phone]   [phone]  PROGRESS NOTE  Patient Care Team: Addie Camellia CROME, MD as PCP - General (Internal Medicine)  Hematological/Oncological History # Iron  Deficiency Anemia due to Chronic Blood Loss # Sickle Cell Trait  1) 03/26/2009: Hgb 7.2, first Hgb on record 2) 09/02/2011: 2 units of PRBC at [facility] (Hgb noted at 8.9).  3)  12/03/2011: colonoscopy, found colon polyp and sigmoid diverticula. 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 Underwent argon plasma coagulation  4) Jan 2018: colonoscopy with 2 tubular adenomas removed. Small colonic AVM and small internal hemorrhoids.  5)10/24/2017: Hgb 7.7, MCV 71, Plt 338, and WBC 6.7. POEM procedure for Achalasia. duodenal AVM fulgurated.  6) 03/2018: Hgb 8.2.  7) March 2020: capsule endoscopy: nonbleeding gastric and proximal small bowel AVM,  8) 04/26/2019: Establish Care with Dr. Federico  WBC 6.1, Hgb 9.3, MCV 75.3, Plt 289 9) 05/17/2019: Underwent EGD which showed esophagitis and small non-bleeding duodenal angiodysplasias. 10) 07/27/2019: WBC 5.9, Hgb 9.8, MCV 77.8, Plt 271. Recommend IV iron  based on labs. Received feraheme  510mg  IV q7 days x 2 doses on 3/8 and 08/15/2019. Hgb electrophoresis confirms sickle cell trait.  11) 10/24/2019: WBC 5.4, Hgb 10.2, MCV 80.5, Plt 252 12) 02/15/2020: WBC 5.1, Hgb 9.7, MCV 80.2, Plt 280 13) 9/24-10/06/2019: IV feraheme  510mg  x 2 doses 14) 04/22/2021: Patient presented to emergency department with heavy nosebleeding.  Had a syncopal episode in the emergency department.  Discharged on 04/24/2021. Hgb 7.2 on admission.  15) 07/05/2021: presented to clinic with Hgb 6.2, MCV 88.9 and Plt 286. Admitted for GI workup.  16) 11/27/2021: colonscopy showed a single small localized angioectasia which was ablated. Small bowel endoscopy showed 5 angioectasias treated with APC  Interval History:  Lacey Vega 73 y.o. female with medical history  significant for iron  deficiency anemia 2/2 to chronic blood loss/ sickle cell trait who presents for a follow up visit. The patient's last visit was on 02/09/2024. In the interim, she has had no major changes in her health.  On exam today Lacey Vega reports that she did not have turkey for Thanksgiving and prefers ham.  She reports that she had a good Thanksgiving dinner.  She reports her energy and appetite are strong and she is not having any overt signs of bleeding, bruising, or dark stools.  She continues taking her iron  pills and they are not causing any stomach upset such as constipation, nausea, vomiting, or diarrhea.  She reports she has had no trouble with runny nose, sore throat, cough.  She denies any recent fevers, chills, sweats.  She reports she does not enjoy iron  rich food that much does not eating red meat.  Otherwise she is her baseline level of health with no lightheadedness, dizziness, shortness of breath.  Full 10 point ROS is otherwise negative.  MEDICAL HISTORY:  Past Medical History:  Diagnosis Date   Anemia    Anxiety    Asthma    Asthma    Bronchitis    hx   Colon polyps    hyperplastic   Diabetes (HCC)    mild, history of   DJD (degenerative joint disease), cervical    Dysphagia    history of   Dyspnea    Gallstones    GERD (gastroesophageal reflux disease)    H/O chest pain    secondary to anemia   Headache    History of arteriovenous malformation (AVM)    History of  blood transfusion 10/2021   History of hiatal hernia    History of tobacco use    Hyperlipemia    Hypertension    Hypothyroidism    Iron  deficiency anemia    Sickle cell anemia (HCC)    patient is not aware of this   Thyroid  disease    TIA (transient ischemic attack)    was told that she possible had one   Wears dentures    Wears glasses     SURGICAL HISTORY: Past Surgical History:  Procedure Laterality Date   ABDOMINAL AORTIC ENDOVASCULAR STENT GRAFT Bilateral 01/31/2021    Procedure: ENDOVASCULAR ANEURYSM REPAIR (EVAR)Bilateral Groin Cutdown, left femoral endaterectomy with bovine patch angioplasty.;  Surgeon: Magda Debby SAILOR, MD;  Location: [facility] OR;  Service: Vascular;  Laterality: Bilateral;   ABDOMINAL HYSTERECTOMY     ANTERIOR CERVICAL DECOMP/DISCECTOMY FUSION N/A 10/03/2016   Procedure: ANTERIOR CERVICAL DECOMPRESSION/DISCECTOMY FUSION CERVICAL THREE- CERVICAL FOUR;  Surgeon: Gillie Duncans, MD;  Location: [facility] OR;  Service: Neurosurgery;  Laterality: N/A;  Right side approach   BOTOX  INJECTION  08/29/2015   Procedure: BOTOX  INJECTION;  Surgeon: Jerrell Sol, MD;  Location: [facility] ENDOSCOPY;  Service: Endoscopy;;   CHOLECYSTECTOMY     COLONOSCOPY  12/03/2011   Procedure: COLONOSCOPY;  Surgeon: Jerrell KYM Sol, MD;  Location: [facility] ENDOSCOPY;  Service: Endoscopy;  Laterality: N/A;   COLONOSCOPY N/A 11/27/2021   Procedure: COLONOSCOPY;  Surgeon: Federico Rosario BROCKS, MD;  Location: [facility];  Service: Gastroenterology;  Laterality: N/A;   ENTEROSCOPY N/A 07/07/2021   Procedure: ENTEROSCOPY;  Surgeon: Rosalie Kitchens, MD;  Location: [facility];  Service: Endoscopy;  Laterality: N/A;   ENTEROSCOPY N/A 08/06/2021   Procedure: ENTEROSCOPY;  Surgeon: Saintclair Jasper, MD;  Location: [facility];  Service: Gastroenterology;  Laterality: N/A;   ENTEROSCOPY N/A 11/27/2021   Procedure: SMALL  BOWEL ENTEROSCOPY;  Surgeon: Federico Rosario BROCKS, MD;  Location: [facility];  Service: Gastroenterology;  Laterality: N/A;   ENTEROSCOPY N/A 11/07/2022   Procedure: ENTEROSCOPY;  Surgeon: Charlanne Groom, MD;  Location: [facility];  Service: Gastroenterology;  Laterality: N/A;   ENTEROSCOPY N/A 04/03/2023   Procedure: ENTEROSCOPY;  Surgeon: Avram Lupita BRAVO, MD;  Location: [facility];  Service: Gastroenterology;  Laterality: N/A;   ENTEROSCOPY N/A 12/05/2023   Procedure: ENTEROSCOPY;  Surgeon: Federico Rosario BROCKS, MD;  Location: [facility] ENDOSCOPY;  Service: Gastroenterology;  Laterality: N/A;   ESOPHAGEAL MANOMETRY  N/A 07/13/2017   Procedure: ESOPHAGEAL MANOMETRY (EM);  Surgeon: Sol Jerrell, MD;  Location: [facility];  Service: Endoscopy;  Laterality: N/A;   ESOPHAGOGASTRODUODENOSCOPY  12/03/2011   Procedure: ESOPHAGOGASTRODUODENOSCOPY (EGD);  Surgeon: Jerrell KYM Sol, MD;  Location: [facility];  Service: Endoscopy;  Laterality: N/A;   ESOPHAGOGASTRODUODENOSCOPY (EGD) WITH PROPOFOL  N/A 08/29/2015   Procedure: ESOPHAGOGASTRODUODENOSCOPY (EGD) WITH PROPOFOL ;  Surgeon: Jerrell Sol, MD;  Location: [facility] ENDOSCOPY;  Service: Endoscopy;  Laterality: N/A;   ESOPHAGOGASTRODUODENOSCOPY (EGD) WITH PROPOFOL  N/A 05/17/2019   Procedure: ESOPHAGOGASTRODUODENOSCOPY (EGD) WITH PROPOFOL ;  Surgeon: Sol Jerrell, MD;  Location: [facility];  Service: Endoscopy;  Laterality: N/A;   ESOPHAGOGASTRODUODENOSCOPY (EGD) WITH PROPOFOL  N/A 04/24/2021   Procedure: ESOPHAGOGASTRODUODENOSCOPY (EGD) WITH PROPOFOL ;  Surgeon: Elicia Claw, MD;  Location: [facility];  Service: Gastroenterology;  Laterality: N/A;   GIVENS CAPSULE STUDY N/A 08/05/2018   Procedure: GIVENS CAPSULE STUDY;  Surgeon: Sol Jerrell, MD;  Location: [facility] ENDOSCOPY;  Service: Endoscopy;  Laterality: N/A;   GIVENS CAPSULE STUDY N/A 07/06/2021   Procedure: GIVENS CAPSULE STUDY;  Surgeon: Rosalie Kitchens, MD;  Location: [facility];  Service: Endoscopy;  Laterality: N/A;   HOT HEMOSTASIS  12/03/2011   Procedure: HOT HEMOSTASIS (ARGON PLASMA COAGULATION/BICAP);  Surgeon: Jerrell KYM Sol, MD;  Location: [facility];  Service: Endoscopy;  Laterality: N/A;   HOT HEMOSTASIS N/A 07/07/2021   Procedure: HOT HEMOSTASIS (ARGON PLASMA COAGULATION/BICAP);  Surgeon: Rosalie Kitchens, MD;  Location: [facility];  Service: Endoscopy;  Laterality: N/A;   HOT HEMOSTASIS N/A 08/06/2021   Procedure: HOT HEMOSTASIS (ARGON PLASMA COAGULATION/BICAP);  Surgeon: Saintclair Jasper, MD;  Location: [facility];  Service: Gastroenterology;  Laterality: N/A;   HOT HEMOSTASIS  11/27/2021    Procedure: HOT HEMOSTASIS (ARGON PLASMA COAGULATION/BICAP);  Surgeon: Federico Rosario BROCKS, MD;  Location: [facility];  Service: Gastroenterology;;  EGD and COLON   HOT HEMOSTASIS N/A 11/07/2022   Procedure: HOT HEMOSTASIS (ARGON PLASMA COAGULATION/BICAP);  Surgeon: Charlanne Groom, MD;  Location: [facility];  Service: Gastroenterology;  Laterality: N/A;   HOT HEMOSTASIS N/A 04/03/2023   Procedure: HOT HEMOSTASIS (ARGON PLASMA COAGULATION/BICAP);  Surgeon: Avram Lupita BRAVO, MD;  Location: [facility];  Service: Gastroenterology;  Laterality: N/A;   HOT HEMOSTASIS N/A 12/05/2023   Procedure: EGD, WITH ARGON PLASMA COAGULATION;  Surgeon: Federico Rosario BROCKS, MD;  Location: [facility] ENDOSCOPY;  Service: Gastroenterology;  Laterality: N/A;   NECK SURGERY     PERIPHERAL VASCULAR INTERVENTION  03/29/2021   Procedure: PERIPHERAL VASCULAR INTERVENTION;  Surgeon: Magda Debby SAILOR, MD;  Location: [facility] INVASIVE CV LAB;  Service: Cardiovascular;;  Lt SFA Brachial Approach   POLYPECTOMY  11/27/2021   Procedure: POLYPECTOMY;  Surgeon: Federico Rosario BROCKS, MD;  Location: [facility];  Service: Gastroenterology;;   ROTATOR CUFF REPAIR     right   ROTATOR CUFF REPAIR Right 2014   THROMBECTOMY FEMORAL ARTERY Left 01/31/2021   Procedure: THROMBECTOMY POPLITEAL  ARTERY;  Surgeon: Magda Debby SAILOR, MD;  Location: [facility] OR;  Service: Vascular;  Laterality: Left;   TONSILLECTOMY      SOCIAL HISTORY: Social History   Socioeconomic History   Marital status: Divorced    Spouse name: Not on file   Number of children: 2   Years of education: Not on file   Highest education level: Not on file  Occupational History   Occupation: group leader environmental services    Employer: [employer]    Comment: [employer]  Tobacco Use   Smoking status: Former    Current packs/day: 0.00    Types: Cigarettes    Quit date: 06/02/2000    Years since quitting: 23.9   Smokeless tobacco: Never  Vaping Use   Vaping status: Never Used   Substance and Sexual Activity   Alcohol use: No   Drug use: No   Sexual activity: Not on file  Other Topics Concern   Not on file  Social History Narrative   ** Merged History Encounter **       Social Drivers of Health   Financial Resource Strain: Not on file  Food Insecurity: No Food Insecurity (12/04/2023)   Hunger Vital Sign    Worried About Running Out of Food in the Last Year: Never true    Ran Out of Food in the Last Year: Never true  Transportation Needs: No Transportation Needs (12/06/2023)   PRAPARE - Administrator, Civil Service (Medical): No    Lack of Transportation (Non-Medical): No  Physical Activity: Not on file  Stress: Not on file  Social Connections: Unknown (12/06/2023)   Social Connection and Isolation Panel  Frequency of Communication with Friends and Family: Twice a week    Frequency of Social Gatherings with Friends and Family: Three times a week    Attends Religious Services: More than 4 times per year    Active Member of Clubs or Organizations: Yes    Attends Banker Meetings: More than 4 times per year    Marital Status: Patient unable to answer  Intimate Partner Violence: Not At Risk (12/06/2023)   Humiliation, Afraid, Rape, and Kick questionnaire    Fear of Current or Ex-Partner: No    Emotionally Abused: No    Physically Abused: No    Sexually Abused: No    FAMILY HISTORY: Family History  Problem Relation Age of Onset   Diabetes Mother    Heart attack Father    Heart attack Brother    Cancer Brother        type unknown   Cancer Brother        type unknown   Cancer Brother        type unknown   Cancer Maternal Aunt     ALLERGIES:  is allergic to oxycodone.  MEDICATIONS:  Current Outpatient Medications  Medication Sig Dispense Refill   albuterol  (VENTOLIN  HFA) 108 (90 Base) MCG/ACT inhaler Inhale 1-2 puffs into the lungs every 6 (six) hours as needed for wheezing or shortness of breath.     ascorbic acid  (VITAMIN C) 500 MG tablet Take 500 mg by mouth daily. (Patient not taking: Reported on 02/09/2024)     aspirin  EC 81 MG tablet Take 81 mg by mouth daily. Swallow whole.     atorvastatin  (LIPITOR) 40 MG tablet Take 40 mg by mouth at bedtime.     Cinnamon 500 MG TABS Take 1,000 mg by mouth every evening.     cyanocobalamin  (VITAMIN B12) 1000 MCG tablet Take 1,000 mcg by mouth daily.     donepezil  (ARICEPT ) 5 MG tablet Take 5 mg by mouth at bedtime.     ferrous gluconate  (FERGON) 324 MG tablet Take 324 mg by mouth daily with breakfast.     folic acid  (FOLVITE ) 800 MCG tablet Take 800 mcg by mouth daily.     levalbuterol (XOPENEX) 0.63 MG/3ML nebulizer solution Take 0.63 mg by nebulization every 6 (six) hours as needed for wheezing or shortness of breath.     levothyroxine  (SYNTHROID ) 125 MCG tablet Take 125 mcg by mouth See admin instructions. Take 125 mcg by mouth before breakfast on Mon/Tues/Wed/Thurs/Fri/Sat (take nothing on Sundays)     LINZESS  145 MCG CAPS capsule TAKE 1 CAPSULE BY MOUTH DAILY BEFORE BREAKFAST. 30 capsule 6   montelukast  (SINGULAIR ) 10 MG tablet Take 10 mg by mouth daily in the afternoon.     Multiple Vitamins-Minerals (HAIR SKIN NAILS PO) Take 2 capsules by mouth daily.     nitroGLYCERIN  (NITROSTAT ) 0.4 MG SL tablet Place 0.4 mg under the tongue every 5 (five) minutes as needed for chest pain.     NON FORMULARY Take 2 tablets by mouth daily. Sea Moss Advanced     Omega-3 Fatty Acids (FISH OIL BURP-LESS) 1200 MG CAPS Take 1,200 mg by mouth daily in the afternoon.     pantoprazole  (PROTONIX ) 40 MG tablet Take 1 tablet (40 mg total) by mouth daily before breakfast. 90 tablet 1   potassium chloride  SA (KLOR-CON  M) 20 MEQ tablet Take 20 mEq by mouth 3 (three) times a week.     revefenacin (YUPELRI) 175 MCG/3ML nebulizer solution Take 175  mcg by nebulization daily.     spironolactone (ALDACTONE) 25 MG tablet Take 25 mg by mouth daily.     WIXELA INHUB 250-50 MCG/ACT AEPB Inhale 1 puff  into the lungs in the morning and at bedtime.     No current facility-administered medications for this visit.    REVIEW OF SYSTEMS:   Constitutional: ( - ) fevers, ( - )  chills , ( - ) night sweats (+) fatigue Eyes: ( - ) blurriness of vision, ( - ) double vision, ( - ) watery eyes Ears, nose, mouth, throat, and face: ( - ) mucositis, ( - ) sore throat Respiratory: ( - ) cough, ( - ) dyspnea, ( - ) wheezes Cardiovascular: ( - ) palpitation, ( - ) chest discomfort, ( + ) lower extremity swelling Gastrointestinal:  ( - ) nausea, ( - ) heartburn, ( - ) change in bowel habits Skin: ( - ) abnormal skin rashes Lymphatics: ( - ) new lymphadenopathy, ( - ) easy bruising Neurological: ( - ) numbness, ( - ) tingling, ( - ) new weaknesses Behavioral/Psych: ( - ) mood change, ( - ) new changes  All other systems were reviewed with the patient and are negative.  PHYSICAL EXAMINATION: ECOG PERFORMANCE STATUS: 1 - Symptomatic but completely ambulatory  Vitals:   05/04/24 1131  BP: (!) 141/60  Pulse: 68  Resp: 14  Temp: 98.3 F (36.8 C)  SpO2: 99%    Filed Weights   05/04/24 1131  Weight: 193 lb 1.6 oz (87.6 kg)     GENERAL: elderly appearing African American female in NAD  SKIN: skin color, texture, turgor are normal, no rashes or significant lesions EYES: conjunctiva are pink and non-injected, sclera clear LUNGS: clear to auscultation and percussion with normal breathing effort HEART: regular rate & rhythm and no murmurs. Bilateral lower extremity edema Musculoskeletal: no cyanosis of digits and no clubbing  PSYCH: alert & oriented x 3, fluent speech NEURO: no focal motor/sensory deficits  LABORATORY DATA:  I have reviewed the data as listed    Latest Ref Rng & Units 05/04/2024   10:54 AM 04/05/2024    9:33 AM 03/08/2024    9:44 AM  CBC  WBC 4.0 - 10.5 K/uL 5.1  4.2  6.0   Hemoglobin 12.0 - 15.0 g/dL 89.3  89.5  8.7   Hematocrit 36.0 - 46.0 % 34.0  33.4  27.7   Platelets 150  - 400 K/uL 254  270  299        Latest Ref Rng & Units 05/04/2024   10:54 AM 04/05/2024    9:33 AM 03/08/2024    9:44 AM  CMP  Glucose 70 - 99 mg/dL 884  95  884   BUN 8 - 23 mg/dL 15  13  16    Creatinine 0.44 - 1.00 mg/dL 8.71  8.76  8.52   Sodium 135 - 145 mmol/L 145  143  143   Potassium 3.5 - 5.1 mmol/L 3.9  4.2  4.3   Chloride 98 - 111 mmol/L 108  110  111   CO2 22 - 32 mmol/L 28  28  27    Calcium  8.9 - 10.3 mg/dL 9.6  9.1  9.2   Total Protein 6.5 - 8.1 g/dL 7.3  7.2  7.3   Total Bilirubin 0.0 - 1.2 mg/dL 0.8  0.5  0.4   Alkaline Phos 38 - 126 U/L 140  118  118   AST 15 - 41 U/L  19  13  13    ALT 0 - 44 U/L 8  5  6      RADIOGRAPHIC STUDIES: None relevant to review.  No results found.    ASSESSMENT & PLAN Lacey Vega is a 73 y.o. female with medical history significant for iron  deficiency anemia 2/2 to chronic blood loss presents for a follow up visit.    #Iron  Deficiency Anemia from Chronic Blood Loss --source of blood loss previously identified as AVMs in the GI tract. Likely a strong contribution from the patient's sickle cell trait preventing her from reaching a normal Hgb. Baseline Hgb is approximately 10.  --Patient last received IV feraheme  510 mg x 2 doses from 07/07/23-07/16/23.  --transfusion goal for Hgb <7.0.  PLAN: --labs today show WBC 5.1, Hgb 10.6, MCV 81, Plt 245  --Continue PO ferrous sulfide 325mg  daily with source of vitamin C. --Recommend IV iron  to bolster levels if her iron  levels are found to be low. --RTC in 12 weeks with interval 6 week labs. Strict return precautions for any signs/symptoms of worsening anemia.    #Sickle Cell Trait -- Hgb electrophoresis confirmed sickle cell trait on 07/27/2019. --like a strong contributing factor to the patients persistently low Hgb. Most likely her baseline hemoglobin is approximately 10.0   #Bleeding AVM/GI Bleeding --GI following  --recently hospitalized from 12/04/2023-12/07/2023  due to GI bleeding  and acute blood loss anemia. EGD on 12/05/2023 revealed 3 mm bleeding AVM in the duodenum and 2 nonbleeding AVMs in the jejunum. Received 2 units of PRBC on 12/04/2023.   No orders of the defined types were placed in this encounter.  All questions were answered. The patient knows to call the clinic with any problems, questions or concerns.   I have spent a total of 25 minutes minutes of face-to-face and non-face-to-face time, preparing to see the patient, obtaining and/or reviewing separately obtained history, performing a medically appropriate examination, counseling and educating the patient, ordering medications/tests/procedures, documenting clinical information in the electronic health record, independently interpreting results and communicating results to the patient, and care coordination.   Norleen IVAR Kidney, MD Department of Hematology/Oncology [facility] Phone: [phone] Pager: [phone] Email: [email or web address]    05/04/2024 2:48 PM

## 2024-05-16 NOTE — Addendum Note (Signed)
 Addended by: DAYNE SHERRY RAMAN on: 05/16/2024 09:09 AM   Modules accepted: Orders

## 2024-06-15 ENCOUNTER — Inpatient Hospital Stay: Attending: Hematology and Oncology

## 2024-06-15 DIAGNOSIS — K922 Gastrointestinal hemorrhage, unspecified: Secondary | ICD-10-CM | POA: Insufficient documentation

## 2024-06-15 DIAGNOSIS — D573 Sickle-cell trait: Secondary | ICD-10-CM | POA: Diagnosis not present

## 2024-06-15 DIAGNOSIS — D500 Iron deficiency anemia secondary to blood loss (chronic): Secondary | ICD-10-CM | POA: Diagnosis present

## 2024-06-15 LAB — IRON AND IRON BINDING CAPACITY (CC-WL,HP ONLY)
Iron: 49 ug/dL (ref 28–170)
Saturation Ratios: 14 % (ref 10.4–31.8)
TIBC: 340 ug/dL (ref 250–450)
UIBC: 291 ug/dL

## 2024-06-15 LAB — CMP (CANCER CENTER ONLY)
ALT: 6 U/L (ref 0–44)
AST: 18 U/L (ref 15–41)
Albumin: 4.2 g/dL (ref 3.5–5.0)
Alkaline Phosphatase: 129 U/L — ABNORMAL HIGH (ref 38–126)
Anion gap: 13 (ref 5–15)
BUN: 13 mg/dL (ref 8–23)
CO2: 27 mmol/L (ref 22–32)
Calcium: 9.1 mg/dL (ref 8.9–10.3)
Chloride: 106 mmol/L (ref 98–111)
Creatinine: 1.21 mg/dL — ABNORMAL HIGH (ref 0.44–1.00)
GFR, Estimated: 47 mL/min — ABNORMAL LOW
Glucose, Bld: 104 mg/dL — ABNORMAL HIGH (ref 70–99)
Potassium: 3.8 mmol/L (ref 3.5–5.1)
Sodium: 147 mmol/L — ABNORMAL HIGH (ref 135–145)
Total Bilirubin: 0.6 mg/dL (ref 0.0–1.2)
Total Protein: 7.4 g/dL (ref 6.5–8.1)

## 2024-06-15 LAB — CBC WITH DIFFERENTIAL (CANCER CENTER ONLY)
Abs Immature Granulocytes: 0.02 K/uL (ref 0.00–0.07)
Basophils Absolute: 0 K/uL (ref 0.0–0.1)
Basophils Relative: 0 %
Eosinophils Absolute: 0.1 K/uL (ref 0.0–0.5)
Eosinophils Relative: 3 %
HCT: 34.1 % — ABNORMAL LOW (ref 36.0–46.0)
Hemoglobin: 10.8 g/dL — ABNORMAL LOW (ref 12.0–15.0)
Immature Granulocytes: 0 %
Lymphocytes Relative: 22 %
Lymphs Abs: 1 K/uL (ref 0.7–4.0)
MCH: 26.1 pg (ref 26.0–34.0)
MCHC: 31.7 g/dL (ref 30.0–36.0)
MCV: 82.4 fL (ref 80.0–100.0)
Monocytes Absolute: 0.5 K/uL (ref 0.1–1.0)
Monocytes Relative: 11 %
Neutro Abs: 2.9 K/uL (ref 1.7–7.7)
Neutrophils Relative %: 64 %
Platelet Count: 285 K/uL (ref 150–400)
RBC: 4.14 MIL/uL (ref 3.87–5.11)
RDW: 15.7 % — ABNORMAL HIGH (ref 11.5–15.5)
WBC Count: 4.6 K/uL (ref 4.0–10.5)
nRBC: 0 % (ref 0.0–0.2)

## 2024-06-15 LAB — RETIC PANEL
Immature Retic Fract: 12.3 % (ref 2.3–15.9)
RBC.: 4.17 MIL/uL (ref 3.87–5.11)
Retic Count, Absolute: 49.6 K/uL (ref 19.0–186.0)
Retic Ct Pct: 1.2 % (ref 0.4–3.1)
Reticulocyte Hemoglobin: 26.2 pg — ABNORMAL LOW

## 2024-06-15 LAB — FERRITIN: Ferritin: 135 ng/mL (ref 11–307)

## 2024-07-27 ENCOUNTER — Inpatient Hospital Stay: Admitting: Hematology and Oncology

## 2024-07-27 ENCOUNTER — Inpatient Hospital Stay
